# Patient Record
Sex: Male | Born: 1980
Health system: Southern US, Community
[De-identification: ages and names within clinical notes are randomized; demographics above are authoritative.]

## PROBLEM LIST (undated history)

## (undated) DIAGNOSIS — I10 Essential (primary) hypertension: Secondary | ICD-10-CM

## (undated) DIAGNOSIS — I5022 Chronic systolic (congestive) heart failure: Secondary | ICD-10-CM

## (undated) DIAGNOSIS — Z9889 Other specified postprocedural states: Secondary | ICD-10-CM

## (undated) DIAGNOSIS — Z91199 Patient's noncompliance with other medical treatment and regimen due to unspecified reason: Secondary | ICD-10-CM

## (undated) DIAGNOSIS — Z8701 Personal history of pneumonia (recurrent): Secondary | ICD-10-CM

## (undated) DIAGNOSIS — N182 Chronic kidney disease, stage 2 (mild): Secondary | ICD-10-CM

## (undated) DIAGNOSIS — I509 Heart failure, unspecified: Secondary | ICD-10-CM

## (undated) DIAGNOSIS — I428 Other cardiomyopathies: Secondary | ICD-10-CM

## (undated) DIAGNOSIS — R519 Headache, unspecified: Secondary | ICD-10-CM

## (undated) DIAGNOSIS — R51 Headache: Secondary | ICD-10-CM

## (undated) DIAGNOSIS — J189 Pneumonia, unspecified organism: Secondary | ICD-10-CM

## (undated) DIAGNOSIS — F419 Anxiety disorder, unspecified: Secondary | ICD-10-CM

## (undated) DIAGNOSIS — I340 Nonrheumatic mitral (valve) insufficiency: Secondary | ICD-10-CM

## (undated) DIAGNOSIS — Z9119 Patient's noncompliance with other medical treatment and regimen: Secondary | ICD-10-CM

## (undated) DIAGNOSIS — R0600 Dyspnea, unspecified: Secondary | ICD-10-CM

## (undated) HISTORY — PX: CARDIAC SURGERY: SHX584

## (undated) HISTORY — PX: SURGERY SCROTAL / TESTICULAR: SUR1316

---

## 2005-09-25 ENCOUNTER — Emergency Department (HOSPITAL_COMMUNITY): Admission: EM | Admit: 2005-09-25 | Discharge: 2005-09-26 | Payer: Self-pay | Admitting: Emergency Medicine

## 2009-07-13 DIAGNOSIS — J189 Pneumonia, unspecified organism: Secondary | ICD-10-CM

## 2009-07-13 HISTORY — DX: Pneumonia, unspecified organism: J18.9

## 2010-02-26 ENCOUNTER — Emergency Department (HOSPITAL_COMMUNITY): Admission: EM | Admit: 2010-02-26 | Discharge: 2010-02-27 | Payer: Self-pay | Admitting: Emergency Medicine

## 2010-10-21 ENCOUNTER — Emergency Department (HOSPITAL_COMMUNITY)
Admission: EM | Admit: 2010-10-21 | Discharge: 2010-10-21 | Disposition: A | Discharge status: Home / Self Care | Payer: Self-pay | Attending: Emergency Medicine | Admitting: Emergency Medicine

## 2010-10-21 ENCOUNTER — Emergency Department (HOSPITAL_COMMUNITY): Payer: Self-pay

## 2010-10-21 DIAGNOSIS — Z79899 Other long term (current) drug therapy: Secondary | ICD-10-CM | POA: Insufficient documentation

## 2010-10-21 DIAGNOSIS — R079 Chest pain, unspecified: Secondary | ICD-10-CM | POA: Insufficient documentation

## 2010-10-21 DIAGNOSIS — R0609 Other forms of dyspnea: Secondary | ICD-10-CM | POA: Insufficient documentation

## 2010-10-21 DIAGNOSIS — R059 Cough, unspecified: Secondary | ICD-10-CM | POA: Insufficient documentation

## 2010-10-21 DIAGNOSIS — R0989 Other specified symptoms and signs involving the circulatory and respiratory systems: Secondary | ICD-10-CM | POA: Insufficient documentation

## 2010-10-21 DIAGNOSIS — N289 Disorder of kidney and ureter, unspecified: Secondary | ICD-10-CM | POA: Insufficient documentation

## 2010-10-21 DIAGNOSIS — I10 Essential (primary) hypertension: Secondary | ICD-10-CM | POA: Insufficient documentation

## 2010-10-21 DIAGNOSIS — R05 Cough: Secondary | ICD-10-CM | POA: Insufficient documentation

## 2010-10-21 LAB — POCT I-STAT, CHEM 8
BUN: 22 mg/dL (ref 6–23)
Chloride: 105 mEq/L (ref 96–112)
Creatinine, Ser: 1.8 mg/dL — ABNORMAL HIGH (ref 0.4–1.5)
Glucose, Bld: 94 mg/dL (ref 70–99)
Potassium: 3.9 mEq/L (ref 3.5–5.1)
Sodium: 141 mEq/L (ref 135–145)

## 2010-11-22 ENCOUNTER — Emergency Department (HOSPITAL_COMMUNITY)
Admission: EM | Admit: 2010-11-22 | Discharge: 2010-11-23 | Disposition: A | Discharge status: Other Acute Inpatient Hospital | Payer: Self-pay | Source: Home / Self Care | Attending: Emergency Medicine | Admitting: Emergency Medicine

## 2010-11-22 ENCOUNTER — Emergency Department (HOSPITAL_COMMUNITY): Payer: Self-pay

## 2010-11-22 DIAGNOSIS — I428 Other cardiomyopathies: Secondary | ICD-10-CM | POA: Insufficient documentation

## 2010-11-22 DIAGNOSIS — Z91199 Patient's noncompliance with other medical treatment and regimen due to unspecified reason: Secondary | ICD-10-CM

## 2010-11-22 DIAGNOSIS — D509 Iron deficiency anemia, unspecified: Secondary | ICD-10-CM | POA: Diagnosis present

## 2010-11-22 DIAGNOSIS — I5021 Acute systolic (congestive) heart failure: Principal | ICD-10-CM | POA: Diagnosis present

## 2010-11-22 DIAGNOSIS — N289 Disorder of kidney and ureter, unspecified: Secondary | ICD-10-CM | POA: Diagnosis present

## 2010-11-22 DIAGNOSIS — E876 Hypokalemia: Secondary | ICD-10-CM | POA: Diagnosis present

## 2010-11-22 DIAGNOSIS — I10 Essential (primary) hypertension: Secondary | ICD-10-CM | POA: Diagnosis present

## 2010-11-22 DIAGNOSIS — I509 Heart failure, unspecified: Secondary | ICD-10-CM | POA: Diagnosis present

## 2010-11-22 DIAGNOSIS — R079 Chest pain, unspecified: Secondary | ICD-10-CM | POA: Insufficient documentation

## 2010-11-22 DIAGNOSIS — I498 Other specified cardiac arrhythmias: Secondary | ICD-10-CM | POA: Diagnosis present

## 2010-11-22 DIAGNOSIS — Z9119 Patient's noncompliance with other medical treatment and regimen: Secondary | ICD-10-CM

## 2010-11-22 DIAGNOSIS — R0602 Shortness of breath: Secondary | ICD-10-CM | POA: Insufficient documentation

## 2010-11-22 LAB — CBC
HCT: 32.9 % — ABNORMAL LOW (ref 39.0–52.0)
Hemoglobin: 11.1 g/dL — ABNORMAL LOW (ref 13.0–17.0)
MCH: 26.2 pg (ref 26.0–34.0)
MCHC: 33.7 g/dL (ref 30.0–36.0)
MCV: 77.6 fL — ABNORMAL LOW (ref 78.0–100.0)
RDW: 14.2 % (ref 11.5–15.5)

## 2010-11-22 LAB — DIFFERENTIAL
Basophils Absolute: 0.1 10*3/uL (ref 0.0–0.1)
Eosinophils Relative: 1 % (ref 0–5)
Lymphocytes Relative: 32 % (ref 12–46)
Lymphs Abs: 2.5 10*3/uL (ref 0.7–4.0)
Monocytes Absolute: 0.3 10*3/uL (ref 0.1–1.0)
Monocytes Relative: 4 % (ref 3–12)
Neutro Abs: 4.9 10*3/uL (ref 1.7–7.7)

## 2010-11-22 LAB — COMPREHENSIVE METABOLIC PANEL
ALT: 45 U/L (ref 0–53)
Albumin: 3.5 g/dL (ref 3.5–5.2)
Calcium: 9.9 mg/dL (ref 8.4–10.5)
Glucose, Bld: 116 mg/dL — ABNORMAL HIGH (ref 70–99)
Potassium: 3.5 mEq/L (ref 3.5–5.1)
Sodium: 134 mEq/L — ABNORMAL LOW (ref 135–145)
Total Protein: 7.1 g/dL (ref 6.0–8.3)

## 2010-11-22 LAB — CK TOTAL AND CKMB (NOT AT ARMC)
CK, MB: 2.7 ng/mL (ref 0.3–4.0)
Relative Index: 1.1 (ref 0.0–2.5)

## 2010-11-22 LAB — TROPONIN I: Troponin I: <0.3 ng/mL (ref <0.30)

## 2010-11-22 LAB — SAMPLE TO BLOOD BANK

## 2010-11-23 ENCOUNTER — Inpatient Hospital Stay (HOSPITAL_COMMUNITY)
Admission: EM | Admit: 2010-11-23 | Discharge: 2010-11-27 | DRG: 287 | Disposition: A | Discharge status: Home / Self Care | Payer: Self-pay | Source: Other Acute Inpatient Hospital | Attending: Internal Medicine | Admitting: Internal Medicine

## 2010-11-23 ENCOUNTER — Inpatient Hospital Stay (HOSPITAL_COMMUNITY): Payer: Self-pay

## 2010-11-23 LAB — COMPREHENSIVE METABOLIC PANEL
Albumin: 3.9 g/dL (ref 3.5–5.2)
BUN: 16 mg/dL (ref 6–23)
Calcium: 9.6 mg/dL (ref 8.4–10.5)
Chloride: 99 mEq/L (ref 96–112)
Creatinine, Ser: 1.48 mg/dL (ref 0.4–1.5)
Total Bilirubin: 0.9 mg/dL (ref 0.3–1.2)
Total Protein: 7.8 g/dL (ref 6.0–8.3)

## 2010-11-23 LAB — URINALYSIS, ROUTINE W REFLEX MICROSCOPIC
Glucose, UA: NEGATIVE mg/dL
Leukocytes, UA: NEGATIVE
pH: 6 (ref 5.0–8.0)

## 2010-11-23 LAB — CBC
Hemoglobin: 12.9 g/dL — ABNORMAL LOW (ref 13.0–17.0)
RBC: 4.85 MIL/uL (ref 4.22–5.81)
WBC: 7.9 10*3/uL (ref 4.0–10.5)

## 2010-11-23 LAB — CARDIAC PANEL(CRET KIN+CKTOT+MB+TROPI)
CK, MB: 2.7 ng/mL (ref 0.3–4.0)
CK, MB: 2.4 ng/mL (ref 0.3–4.0)
Relative Index: 1.1 (ref 0.0–2.5)
Relative Index: 1 (ref 0.0–2.5)
Total CK: 246 U/L — ABNORMAL HIGH (ref 7–232)
Total CK: 213 U/L (ref 7–232)
Total CK: 211 U/L (ref 7–232)
Troponin I: <0.3 ng/mL (ref <0.30)
Troponin I: <0.3 ng/mL (ref <0.30)

## 2010-11-23 LAB — PRO B NATRIURETIC PEPTIDE: Pro B Natriuretic peptide (BNP): 5156 pg/mL — ABNORMAL HIGH (ref 0–125)

## 2010-11-23 LAB — HEMOGLOBIN A1C
Hgb A1c MFr Bld: 5.9 % — ABNORMAL HIGH (ref <5.7)
Mean Plasma Glucose: 123 mg/dL — ABNORMAL HIGH (ref <117)

## 2010-11-23 LAB — URINE MICROSCOPIC-ADD ON

## 2010-11-23 LAB — RAPID URINE DRUG SCREEN, HOSP PERFORMED: Barbiturates: NOT DETECTED

## 2010-11-23 LAB — MRSA PCR SCREENING: MRSA by PCR: NEGATIVE

## 2010-11-23 LAB — GLUCOSE, CAPILLARY: Glucose-Capillary: 131 mg/dL — ABNORMAL HIGH (ref 70–99)

## 2010-11-23 MED ORDER — IOHEXOL 300 MG/ML  SOLN: 80.0000 mL | Freq: Once | INTRAMUSCULAR | Status: AC | PRN

## 2010-11-24 LAB — BASIC METABOLIC PANEL
CO2: 24 mEq/L (ref 19–32)
Calcium: 9.3 mg/dL (ref 8.4–10.5)
Creatinine, Ser: 1.71 mg/dL — ABNORMAL HIGH (ref 0.4–1.5)
GFR calc Af Amer: 58 mL/min — ABNORMAL LOW (ref >60)
GFR calc non Af Amer: 48 mL/min — ABNORMAL LOW (ref >60)
Sodium: 137 mEq/L (ref 135–145)

## 2010-11-25 LAB — BASIC METABOLIC PANEL
Calcium: 9.3 mg/dL (ref 8.4–10.5)
GFR calc Af Amer: >60 mL/min (ref >60)
GFR calc non Af Amer: 58 mL/min — ABNORMAL LOW (ref >60)
Potassium: 3 mEq/L — ABNORMAL LOW (ref 3.5–5.1)
Sodium: 136 mEq/L (ref 135–145)

## 2010-11-25 LAB — POTASSIUM: Potassium: 3.6 mEq/L (ref 3.5–5.1)

## 2010-11-25 LAB — POCT I-STAT 3, VENOUS BLOOD GAS (G3P V)
Bicarbonate: 24.2 mEq/L — ABNORMAL HIGH (ref 20.0–24.0)
pH, Ven: 7.407 — ABNORMAL HIGH (ref 7.250–7.300)

## 2010-11-25 LAB — CBC
HCT: 32.8 % — ABNORMAL LOW (ref 39.0–52.0)
Hemoglobin: 11.1 g/dL — ABNORMAL LOW (ref 13.0–17.0)
MCH: 26.2 pg (ref 26.0–34.0)
RBC: 4.24 MIL/uL (ref 4.22–5.81)

## 2010-11-25 LAB — POCT I-STAT 3, ART BLOOD GAS (G3+)
Acid-base deficit: 2 mmol/L (ref 0.0–2.0)
Bicarbonate: 22.3 mEq/L (ref 20.0–24.0)
pCO2 arterial: 34.8 mmHg — ABNORMAL LOW (ref 35.0–45.0)
pH, Arterial: 7.415 (ref 7.350–7.450)
pO2, Arterial: 66 mmHg — ABNORMAL LOW (ref 80.0–100.0)

## 2010-11-25 LAB — PROTIME-INR
INR: 1.16 (ref 0.00–1.49)
Prothrombin Time: 15 seconds (ref 11.6–15.2)

## 2010-11-26 LAB — CBC
HCT: 33.6 % — ABNORMAL LOW (ref 39.0–52.0)
MCH: 26.3 pg (ref 26.0–34.0)
MCV: 76.7 fL — ABNORMAL LOW (ref 78.0–100.0)
RDW: 13.8 % (ref 11.5–15.5)
WBC: 5.4 10*3/uL (ref 4.0–10.5)

## 2010-11-26 LAB — BASIC METABOLIC PANEL
BUN: 23 mg/dL (ref 6–23)
Chloride: 104 mEq/L (ref 96–112)
Creatinine, Ser: 1.62 mg/dL — ABNORMAL HIGH (ref 0.4–1.5)
GFR calc non Af Amer: 51 mL/min — ABNORMAL LOW (ref >60)
Glucose, Bld: 99 mg/dL (ref 70–99)
Potassium: 3.6 mEq/L (ref 3.5–5.1)

## 2010-11-26 NOTE — Cardiovascular Report (Signed)
  NAMEPAULINO, [name]             ACCOUNT NO.:  [id number]  MEDICAL RECORD NO.:  [id number]           PATIENT TYPE:  I  LOCATION:  [id number]                         FACILITY:  [facility]  PHYSICIAN:  [name] Son Barkan, MD         DATE OF BIRTH:  1980/12/16  DATE OF PROCEDURE:  11/25/2010 DATE OF DISCHARGE:                           CARDIAC CATHETERIZATION   PREPROCEDURE DIAGNOSIS:  Systolic heart failure.  POSTPROCEDURE DIAGNOSIS:  Dilated, non-ischemic cardiomyopathy with  normal coronary arteries.  HISTORY OF PRESENT ILLNESS:  [name] [name] is a 30 year old gentleman with 3 months of progressively increasing shortness of breath, was found to be in decompensated heart failure with a newly reduced ejection fraction of 15-20% and global hypokinesis.  He was referred for left and right heart catheterization to assess filling pressures and cardiac output, as well as rule out any underlying ischemic etiology.  PROCEDURE:  After the patient was brought into cardiac catheterization lab, sterilely prepped and draped in usual fashion.  After procedural time-out, the area around the right femoral artery and femoral vein was identified and 5 mL of 1% lidocaine was used for local anesthesia.  The patient was given 1 mg of Versed and 25 mcg of fentanyl for moderate sedation.  After this, a 5-French right femoral arterial access was obtained and a 8-French right femoral vein access catheter was placed next to it.  Through these catheters, a right and left heart catheterization was performed using a 7-French VIP Swan, a 5-French pigtail catheter, 5-French JR-4 catheter, and 5-French JL-4 catheters. Estimated blood loss was less than 10 mL, and there were no acute complications.  FINDINGS: 1. Left main - no disease. 2. LAD - no disease. 3. Left circumflex - large dominant vessel.  No disease. 4. RCA - small vessel with no disease. 5. LVEDP = 18 mmHg.  Right heart: 1. RA - 8. 2. RV - 44/10. 3. PA -  43/26 (34). 4. PCWP - 29. 5. PA sat % - 62%. 6. AO sat% - 93%. 7. Fick cardiac output/cardiac index - 5.09/2.45. 8. Thermodilution cardiac output/cardiac index - 5.2/2.5.  IMPRESSION: 1. No evidence of coronary artery disease. 2. LVEDP = 18 mmHg. 3. Mildly reduced cardiac output and index with good pulmonary artery     saturation and elevated PCWP.  PLAN: 1. Continue diuresis. 2. Optimize heart failure medications.     [name] Zarius Furr, MD     CH/MEDQ  D:  11/25/2010  T:  11/26/2010  Job:  [id number]  Electronically Signed by K. Aurora Rody M.D. on 11/26/2010 09:42:16 AM

## 2010-11-27 LAB — BASIC METABOLIC PANEL
BUN: 21 mg/dL (ref 6–23)
Calcium: 9 mg/dL (ref 8.4–10.5)
GFR calc non Af Amer: 46 mL/min — ABNORMAL LOW (ref >60)
Glucose, Bld: 100 mg/dL — ABNORMAL HIGH (ref 70–99)

## 2010-11-27 LAB — FOLATE: Folate: 8.6 ng/mL

## 2010-11-27 LAB — CBC
HCT: 33.7 % — ABNORMAL LOW (ref 39.0–52.0)
MCHC: 33.2 g/dL (ref 30.0–36.0)
MCV: 77.6 fL — ABNORMAL LOW (ref 78.0–100.0)
RDW: 13.7 % (ref 11.5–15.5)

## 2010-11-27 LAB — FERRITIN: Ferritin: 156 ng/mL (ref 22–322)

## 2010-12-03 NOTE — Discharge Summary (Signed)
[name], [name]             ACCOUNT NO.:  [id number]  MEDICAL RECORD NO.:  [id number]           PATIENT TYPE:  I  LOCATION:  2005                         FACILITY:  [facility]  PHYSICIAN:  Mali Hilty, MD         DATE OF BIRTH:  [birth year]  DATE OF ADMISSION:  11/23/2010 DATE OF DISCHARGE:  11/27/2010                              DISCHARGE SUMMARY   DISCHARGE DIAGNOSES: 1. Acute systolic heart failure. 2. Nonischemic cardiomyopathy with ejection fraction 15-20%. 3. History of hypertension. 4. Hypokalemia, repleted. 5. Acute renal insufficiency. 6. Anemia, microcytic.  HOSPITAL COURSE:  [name] [name] is a 30 year old African American male who was transferred from [facility].  He presented with complaints of shortness of breath that he had reported for the last 2-3 months.  He also reported that he had a significant illness in December with extremely high fever, nausea, and vomiting.  The patient describes a significant decrease in activity, tolerance over the last 2-3 days prior to admission.  He also started experiencing orthopnea and paroxysmal internal dyspnea with significant dyspnea on exertion.  He also stated he noted some wheezing and using a friend's nebulizer machine with little improvement.  He also reported chest pressure, soreness in his redness, and his entire chest intermittently.  He had been seen in April in the emergency department for chest pressure, was found to be hypertensive at that time and chest x-ray revealed cardiomegaly.  Labs were within normal limits at that time.  On arrival here at [facility] this admission, he had a normal CK-MB and troponin. His BNP was elevated to 7084, and again his chest x-ray revealed cardiomegaly with interstitial edema and bilateral lower lobe opacities. The patient was started on 40 mg of IV Lasix, had been examined and transfer here to [facility].  He remained tachypneic upon arrival, respiratory rate in 30s to  40s, hypertensive at 151/110, was admitted, continued on IV diuretics.  A 2D echocardiogram was ordered.  Cardiac enzymes were cycled.  IV diuretics was at 40 mg q.6 h., was also started on IV nitroglycerin at 10 mcg per minute.  He was also started on ACE inhibitor.  2D echocardiogram showed ejection fraction less than 20% and Doppler parameters were consistent with a restrictive stage III diastolic physiology.  Left atrium was severely dilated.  Left ventricle was moderately dilated with eccentric hypertrophy.  Peak PA pressure 34 mmHg.  [name] [name] first day of diuresis was over 5 liters. Subsequent labs revealed hypokalemia, which was repleted and is probably secondary to IV diuretics.  Zoll LifeVest was ordered.  [name] [name] was scheduled for right and left heart catheterization.  This revealed no evidence of coronary artery disease.  Left ventricular end-diastolic pressure was 18 mmHg.  There was mildly reduced cardiac output and index with good pulmonary artery saturation and elevated PCWP.  The patient continued without complaints.  He continued to diurese.  He was switched from IV diuretics to p.o. as well as substernal carvedilol 6.25 mg b.i.d. on spirolactone 12.5 mg daily.  His hemoglobin was 11.2 with MCV of 77.6.  Anemia panel was ordered.  Results pending.  [name] [name] has been seen by Dr. Debara Pickett, feels he is stable for discharge possibly today, Nov 27, 2010 pending fit of his LifeVest.  Once that is completed, he will be discharged home, and currently has no complaints.  Blood pressure is stable at 115/74.  He has mildly increased heart rate at 96 beats per minute.  DISCHARGE LABORATORY DATA:  WBC 7.5, hemoglobin 11.2, hematocrit 33.7, platelets 289.  Sodium 140, potassium 3.6, chloride 106, carbon dioxide 25, glucose 100, BUN 21, creatinine 1.77, calcium 9.0, magnesium 2.2. Thyroid stimulating hormone 2.473.  Cardiac enzymes were negative x4. Urine drug screen was  negative.  Initial urinalysis was negative for infection.  STUDIES/PROCEDURES: 1. Chest x-ray Nov 23, 2010, progression of bibasilar airspace     disease.  Stable cardiomegaly.  CT angiogram of the chest was     negative for acute PE.  Bibasilar airspace disease, right worse     than left with small right effusion suggesting pneumonia less     likely atypical edema, autoimmune or allergies reaction.  Small     pericardial effusion. 2. Echocardiogram study conclusions, left ventricle cavity size is     mildly dilated and wall thickness was increased with probable     eccentric hypertrophy.  Systolic function was severely reduced.     There is global hypokinesis with estimated ejection fraction less     than 20%.  Doppler parameters consistent with restrictive diastolic     physiology stage III.  The E/E prime was greater than 20 suggesting     markedly increased LV filling pressure.  The EA ratio was greater     than 3.  Aortic valve with mild central regurgitation.  The mitral     valve showed mitral annulus was dilated.  There was moderate mitral     regurgitation.  Left atrium severely dilated.  The right ventricle     cavity size was mildly dilated.  Systolic pressures was increased.     Right atrium was mildly dilated.  Pulmonary arteries PA pressure     was 34 mmHg.  The inferior vena cava vessel was in normal size.     There is trace to mild mostly posterior pericardial effusion.  No     echocardiographic signs of tamponade 3. Cardiac catheterization on Nov 26, 2010:  Findings, left main     disease.  LAD no disease.  Left circumflex, large dominant vessel,     no  disease.  RCA small vessel with no disease.  Left ventricular     diastolic pressure was 81 mmHg.  The right heart RA of 8, RV 44/10,     PA 43/26 (34), PCWP is 29, PA sat percent 62%, AO sat percent 93%,     thick cardiac output/cardiac index was 5.09/2.45.  Thermodilution     cardiac output/cardiac index was 5.2/2.5.   Impression:  No evidence     of coronary disease.  LVEDP of 18 mmHg and mildly reduced cardiac     output and index with a pulmonary artery saturation elevated PCWP.  DISCHARGE MEDICATIONS: 1. Aspirin 325 mg 1 tablet by mouth daily. 2. Carvedilol 6.25 mg 1 tablet by mouth twice daily with meals. 3. Digoxin 0.125 mg 1 tablet by mouth daily. 4. Furosemide 40 mg 1 tablet by mouth daily. 5. Isosorbide mononitrate XR 30 mg tablet 1/2 tablet by mouth daily. 6. Lisinopril 5 mg 1 tablet by mouth daily. 7. Spironolactone 25 mg 1/2 tablet by  mouth daily. 8. Ibuprofen 200 mg over-the-counter 1-2 tablets by mouth every 8     hours as needed for pain.  DISPOSITION:  [name] [name] will be discharged home in stable condition. He is recommended to increase his activity slowly.  May shower and bathe.  No lifting for 2 days.  No driving for 2 days.  He is recommended to eat a low-sodium heart-healthy diet.  If catheter site becomes red, painful, swollen, or discharges fluid or pus, he is to call our office immediately.  He will follow with Dr. Debara Pickett in the [facility] office on Dec 10, 2010, at 9:45 a.m.  He will be discharged once his Zoll LifeVest has been fitted.    ______________________________ Tarri Fuller, PA ______________________________ Mali Hilty, MD    BH/MEDQ  D:  11/27/2010  T:  11/28/2010  Job:  [id number]  Electronically Signed by Tarri Fuller PA on 12/01/2010 01:50:23 PM Electronically Signed by Raliegh Ip. HILTY M.D. on 12/03/2010 08:12:34 AM

## 2010-12-09 NOTE — H&P (Signed)
NAMEWITOLD, [name] [name]             ACCOUNT NO.:  [id number]  MEDICAL RECORD NO.:  [id number]           PATIENT TYPE:  I  LOCATION:  [id number]                         FACILITY:  [facility]  PHYSICIAN:  Mali Charles Andringa, MD         DATE OF BIRTH:  [birth year]  DATE OF ADMISSION:  11/23/2010 DATE OF DISCHARGE:                             HISTORY & PHYSICAL   CHIEF COMPLAINT:  Shortness of breath.  HISTORY OF PRESENT ILLNESS:  [name] [name] is a very pleasant 30 year old African American male who is received and transferred from [facility].  He presented last evening with complaints of shortness of breath.  He reports that for the last to 2-3 months, he has felt poorly with noticeable shortness of breath.  At one point, he has had cough, which was productive at times with clear sputum but no fevers.  He does report a significant illness in December with extremely high fever, nausea, and vomiting, who presented to the emergency department last evening because his symptoms were getting worse.  He had noticed a significant decrease in his activity tolerance over the previous 2-3 days.  He has begun to experience orthopnea and PND as well as significant dyspnea on exertion.  He has noted some wheezing and has been using a friend's neb machine with very little improvement in his symptoms.  He has had some chest pressure occasionally and reports some soreness in his ribs and in his chest intermittently, however, at this time this has improved.  He did present in April to the emergency department for evaluation of chest pressure.  He was found to be hypertensive at that time.  Chest x-ray revealed cardiac cardiomegaly. His labs at that time were within normal limits with the exception of creatinine of 1.8.  On arrival to the emergency department last evening, his CK was 256 with a normal MB and troponin.  His BNP was 7084 and his chest x-ray revealed cardiomegaly with interstitial edema and  bilateral lower lobe opacities.  He was treated with 40 mg of Lasix IV at [facility] and transferred to [facility].  He has been diuresing nicely, however, remains tachypneic with respiratory rates between 30 and 40 per minute.  He also remains hypertensive with blood pressures 151/110.  He does report some improvement in his breathing and has not experienced any further chest pressure.  PAST MEDICAL HISTORY:  Hypertension, when he was seen in April, he was given a prescription for hydrochlorothiazide, however, due to financial reasons he was unable to get that filled, therefore he has not been treating his hypertension.  FAMILY HISTORY:  Mother and father healthy.  There is no family history of coronary artery disease, congestive heart failure or sudden cardiac death that the patient is aware of.  SOCIAL HISTORY:  He is single.  He is currently unemployed.  He lives with his mother.  He denies any tobacco, alcohol or illicit drug use and his urine drug screen is negative.  ALLERGIES:  None known.  CURRENT MEDICATIONS:  None.  REVIEW OF SYSTEMS:  GENERAL:  Positive for fatigue, decreased appetite. No fever or  chills.  HEENT:  No upper respiratory infection, no sore throat or nasal congestion.  CARDIOVASCULAR:  As per HPI.  He denies any lightheadedness, dizziness, no syncope or presyncope.  Noted to palpitations or tachycardia.  RESPIRATORY:  Shortness of breath, dyspnea on exertion, wheezing and cough, which is productive of clear sputum. GI: No heartburn, indigestion.  No nausea or vomiting.  No melena or hematochezia.  GU: No urgency, frequency or hematuria.  ENDOCRINE:  No diabetes or thyroid disease.  SKIN:  No lesions, rashes.  NEUROLOGIC: No numbness or weakness in any of his extremities.  No headache.  PHYSICAL EXAMINATION:  VITAL SIGNS:  Blood pressure is 151110, pulse is 103 and regular, respirations 34, pulse ox is 99%. GENERAL:  This is a pleasant 30 year old African  American male with mild respiratory distress. HEENT:  Pupils are equal and reactive to light and accommodation. Extraocular movements intact.  Sclerae are nonicteric.  Conjunctivae are pink. NECK:  Supple.  No carotid bruits or thyromegaly.  There is JVD at 6 cm. CARDIOVASCULAR:  Regular rate and rhythm, tachycardic, S1, S2.  There is a 1-2/6 murmur heard best in the apex. LUNGS:  Diminished throughout with bibasilar rales. ABDOMEN:  Soft, nontender without hepatosplenomegaly or masses.  Bowel sounds are present x4. EXTREMITIES:  Radial, femoral, dorsal, pedal arteries are present. There is no lower extremity edema.  No clubbing, cyanosis or ulcers. NEUROLOGIC:  Oriented to person, place, and time.  Normal mood and affect.  Cranial nerves II-XII grossly intact. SKIN:  Warm and dry.  LABORATORY DATA:  EKG reveals sinus tachycardia with left ventricular hypertrophy.  Urine drug screen is negative.  CK is 256, MB 2.7 and troponin is less than 0.30.  Sodium is 134, potassium is 3.5, glucose is 116, BUN is 20, creatinine is 1.7.  Liver function studies are normal. BNP is 7084.  Hemoglobin is 11.1, hematocrit 32.9.  Chest x-ray reveals cardiomegaly with interstitial edema and predominantly bilateral lower lobe alveolar opacities likely early edema.  IMPRESSION: 1. Shortness of breath. 2. Pulmonary edema. 3. Cardiomegaly. 4. Hypertension. 5. Noncompliance secondary to financial issues. 6. Murmur.  PLAN:  We will admit to step-down unit.  We will diurese with Lasix 40 mg q.6 h.  We will continue IV nitroglycerin at 10 mcg.  We will get him beta blocker as well as ACE inhibitor.  We will obtain an echo first thing this morning to evaluate LV function as well as his mitral valve. We will titrate his medications as he tolerates.  We will repeat a chest x-ray, BNP and a BMET this morning.    ______________________________ Blair Dolphin, NP   ______________________________ Mali Endia Moncur,  MD    LS/MEDQ  D:  11/23/2010  T:  11/23/2010  Job:  [id number]  cc:   [facility]  Electronically Signed by Bari Mantis NP on 12/08/2010 10:06:53 PM Electronically Signed by Raliegh Ip. Mikiyah Glasner M.D. on 12/09/2010 11:50:46 AM

## 2011-01-08 ENCOUNTER — Encounter (HOSPITAL_COMMUNITY): Payer: Self-pay | Attending: Internal Medicine

## 2011-01-12 ENCOUNTER — Encounter (HOSPITAL_COMMUNITY): Payer: Self-pay

## 2011-01-12 DIAGNOSIS — Z5189 Encounter for other specified aftercare: Secondary | ICD-10-CM | POA: Insufficient documentation

## 2011-01-12 DIAGNOSIS — I509 Heart failure, unspecified: Secondary | ICD-10-CM | POA: Insufficient documentation

## 2011-01-12 DIAGNOSIS — I2589 Other forms of chronic ischemic heart disease: Secondary | ICD-10-CM | POA: Insufficient documentation

## 2011-01-12 DIAGNOSIS — I10 Essential (primary) hypertension: Secondary | ICD-10-CM | POA: Insufficient documentation

## 2011-01-14 ENCOUNTER — Encounter (HOSPITAL_COMMUNITY): Payer: Self-pay

## 2011-01-16 ENCOUNTER — Encounter (HOSPITAL_COMMUNITY)
Admission: RE | Admit: 2011-01-16 | Discharge: 2011-01-16 | Disposition: A | Discharge status: Home / Self Care | Payer: Self-pay | Source: Ambulatory Visit | Attending: Internal Medicine | Admitting: Internal Medicine

## 2011-01-19 ENCOUNTER — Encounter (HOSPITAL_COMMUNITY)
Admission: RE | Admit: 2011-01-19 | Discharge: 2011-01-19 | Disposition: A | Discharge status: Home / Self Care | Payer: Self-pay | Source: Ambulatory Visit | Attending: Internal Medicine | Admitting: Internal Medicine

## 2011-01-21 ENCOUNTER — Encounter (HOSPITAL_COMMUNITY)
Admission: RE | Admit: 2011-01-21 | Discharge: 2011-01-21 | Disposition: A | Discharge status: Home / Self Care | Payer: Self-pay | Source: Ambulatory Visit | Attending: Internal Medicine | Admitting: Internal Medicine

## 2011-01-23 ENCOUNTER — Encounter (HOSPITAL_COMMUNITY)
Admission: RE | Admit: 2011-01-23 | Discharge: 2011-01-23 | Disposition: A | Discharge status: Home / Self Care | Payer: Self-pay | Source: Ambulatory Visit | Attending: Internal Medicine | Admitting: Internal Medicine

## 2011-01-26 ENCOUNTER — Encounter (HOSPITAL_COMMUNITY)
Admission: RE | Admit: 2011-01-26 | Discharge: 2011-01-26 | Disposition: A | Discharge status: Home / Self Care | Payer: Self-pay | Source: Ambulatory Visit | Attending: Internal Medicine | Admitting: Internal Medicine

## 2011-01-28 ENCOUNTER — Encounter (HOSPITAL_COMMUNITY)
Admission: RE | Admit: 2011-01-28 | Discharge: 2011-01-28 | Disposition: A | Discharge status: Home / Self Care | Payer: Self-pay | Source: Ambulatory Visit | Attending: Internal Medicine | Admitting: Internal Medicine

## 2011-01-30 ENCOUNTER — Encounter (HOSPITAL_COMMUNITY)
Admission: RE | Admit: 2011-01-30 | Discharge: 2011-01-30 | Disposition: A | Discharge status: Home / Self Care | Payer: Self-pay | Source: Ambulatory Visit | Attending: Internal Medicine | Admitting: Internal Medicine

## 2011-02-02 ENCOUNTER — Encounter (HOSPITAL_COMMUNITY): Payer: Self-pay

## 2011-02-04 ENCOUNTER — Encounter (HOSPITAL_COMMUNITY)
Admission: RE | Admit: 2011-02-04 | Discharge: 2011-02-04 | Disposition: A | Discharge status: Home / Self Care | Payer: Self-pay | Source: Ambulatory Visit | Attending: Internal Medicine | Admitting: Internal Medicine

## 2011-02-06 ENCOUNTER — Encounter (HOSPITAL_COMMUNITY)
Admission: RE | Admit: 2011-02-06 | Discharge: 2011-02-06 | Disposition: A | Discharge status: Home / Self Care | Payer: Self-pay | Source: Ambulatory Visit | Attending: Internal Medicine | Admitting: Internal Medicine

## 2011-02-09 ENCOUNTER — Encounter (HOSPITAL_COMMUNITY): Payer: Self-pay

## 2011-02-11 ENCOUNTER — Encounter (HOSPITAL_COMMUNITY): Payer: Self-pay

## 2011-02-13 ENCOUNTER — Encounter (HOSPITAL_COMMUNITY): Payer: Self-pay

## 2011-02-16 ENCOUNTER — Encounter (HOSPITAL_COMMUNITY)
Admission: RE | Admit: 2011-02-16 | Discharge: 2011-02-16 | Disposition: A | Discharge status: Home / Self Care | Payer: Self-pay | Source: Ambulatory Visit | Attending: Internal Medicine | Admitting: Internal Medicine

## 2011-02-16 DIAGNOSIS — I509 Heart failure, unspecified: Secondary | ICD-10-CM | POA: Insufficient documentation

## 2011-02-16 DIAGNOSIS — I10 Essential (primary) hypertension: Secondary | ICD-10-CM | POA: Insufficient documentation

## 2011-02-16 DIAGNOSIS — I2589 Other forms of chronic ischemic heart disease: Secondary | ICD-10-CM | POA: Insufficient documentation

## 2011-02-16 DIAGNOSIS — Z5189 Encounter for other specified aftercare: Secondary | ICD-10-CM | POA: Insufficient documentation

## 2011-02-18 ENCOUNTER — Encounter (HOSPITAL_COMMUNITY): Payer: Self-pay

## 2011-02-20 ENCOUNTER — Encounter (HOSPITAL_COMMUNITY): Payer: Self-pay

## 2011-02-23 ENCOUNTER — Encounter (HOSPITAL_COMMUNITY): Payer: Self-pay

## 2011-02-25 ENCOUNTER — Encounter (HOSPITAL_COMMUNITY)
Admission: RE | Admit: 2011-02-25 | Discharge: 2011-02-25 | Disposition: A | Discharge status: Home / Self Care | Payer: Self-pay | Source: Ambulatory Visit | Attending: Internal Medicine | Admitting: Internal Medicine

## 2011-02-27 ENCOUNTER — Encounter (HOSPITAL_COMMUNITY)
Admission: RE | Admit: 2011-02-27 | Discharge: 2011-02-27 | Disposition: A | Discharge status: Home / Self Care | Payer: Self-pay | Source: Ambulatory Visit | Attending: Internal Medicine | Admitting: Internal Medicine

## 2011-03-02 ENCOUNTER — Encounter (HOSPITAL_COMMUNITY): Payer: Self-pay

## 2011-03-04 ENCOUNTER — Encounter (HOSPITAL_COMMUNITY): Payer: Self-pay

## 2011-03-06 ENCOUNTER — Encounter (HOSPITAL_COMMUNITY): Payer: Self-pay

## 2011-03-09 ENCOUNTER — Encounter (HOSPITAL_COMMUNITY): Payer: Self-pay

## 2011-03-11 ENCOUNTER — Encounter (HOSPITAL_COMMUNITY): Payer: Self-pay

## 2011-03-13 ENCOUNTER — Encounter (HOSPITAL_COMMUNITY): Payer: Self-pay

## 2011-03-16 ENCOUNTER — Encounter (HOSPITAL_COMMUNITY): Payer: Self-pay

## 2011-03-18 ENCOUNTER — Encounter (HOSPITAL_COMMUNITY): Payer: Self-pay

## 2011-03-20 ENCOUNTER — Encounter (HOSPITAL_COMMUNITY): Payer: Self-pay

## 2011-03-20 NOTE — Progress Notes (Signed)
Cardiac Rehab Progress Report  Orientation:  01/08/2011  Graduate Date:  tbd Discharge Date: 02/27/2011  Cardiologist: Dr. Debara Pickett Family MD:  Dr. Odis Luster Time:  11:00  A.  Exercise Program:  Tolerates exercise @ 3.5 METS for 15 minutes and Discharged  B.  Mental Health:  Good mental attitude  C.  Education/Instruction/Skills  Knows THR for exercise and Uses Perceived Exertion Scale and/or Dyspnea Scale  D.  Nutrition/Weight Control/Body Composition:  Adherence to prescribed nutrition program: good   E.  Blood Lipids    No results found for this basename: CHOL     No results found for this basename: TRIG     No results found for this basename: HDL     No results found for this basename: CHOLHDL     No results found for this basename: LDLDIRECT      F.  Lifestyle Changes:  Making positive lifestyle changes  G.  Symptoms noted with exercise:  Asymptomatic  Report Completed By:  Norlene Duel  Comments:  Pt came thru 02/27/2011 and did not return. He achieved mets of 3.5. He did very well while in class. He did not attend consistently.

## 2011-03-20 NOTE — Progress Notes (Signed)
Cardiac Rehab Progress Report  Orientation:  01/08/2011 Graduate Date: tbd Discharge Date:  tbd  Cardiologist:  Dr. Debara Pickett Family MD:  Dr. Odis Luster Time:  11:00  A.  Exercise Program:  Tolerates exercise @ 2.9 METS for 15 minutes  B.  Mental Health:  Good mental attitude  C.  Education/Instruction/Skills  Knows THR for exercise and Uses Perceived Exertion Scale and/or Dyspnea Scale  D.  Nutrition/Weight Control/Body Composition:  Adherence to prescribed nutrition program: good   E.  Blood Lipids    No results found for this basename: CHOL     No results found for this basename: TRIG     No results found for this basename: HDL     No results found for this basename: CHOLHDL     No results found for this basename: LDLDIRECT      F.  Lifestyle Changes:  Making positive lifestyle changes  G.  Symptoms noted with exercise:  Asymptomatic  Report Completed By:  Norlene Duel  Comments:  Patient has done very well for his 1st 3 sessions. He achieved a peak mets of 2.9

## 2011-03-23 ENCOUNTER — Encounter (HOSPITAL_COMMUNITY): Payer: Self-pay

## 2011-03-25 ENCOUNTER — Encounter (HOSPITAL_COMMUNITY): Payer: Self-pay

## 2011-03-27 ENCOUNTER — Encounter (HOSPITAL_COMMUNITY): Payer: Self-pay

## 2011-03-30 ENCOUNTER — Encounter (HOSPITAL_COMMUNITY): Payer: Self-pay

## 2011-04-01 ENCOUNTER — Encounter (HOSPITAL_COMMUNITY): Payer: Self-pay

## 2011-04-03 ENCOUNTER — Encounter (HOSPITAL_COMMUNITY): Payer: Self-pay

## 2011-04-06 ENCOUNTER — Encounter (HOSPITAL_COMMUNITY): Payer: Self-pay

## 2011-04-08 ENCOUNTER — Encounter (HOSPITAL_COMMUNITY): Payer: Self-pay

## 2011-04-10 ENCOUNTER — Encounter (HOSPITAL_COMMUNITY): Payer: Self-pay

## 2011-04-13 ENCOUNTER — Encounter (HOSPITAL_COMMUNITY): Payer: Self-pay

## 2011-04-15 ENCOUNTER — Encounter (HOSPITAL_COMMUNITY): Payer: Self-pay

## 2011-04-17 ENCOUNTER — Encounter (HOSPITAL_COMMUNITY): Payer: Self-pay

## 2011-04-20 ENCOUNTER — Encounter (HOSPITAL_COMMUNITY): Payer: Self-pay

## 2011-04-22 ENCOUNTER — Encounter (HOSPITAL_COMMUNITY): Payer: Self-pay

## 2011-04-24 ENCOUNTER — Encounter (HOSPITAL_COMMUNITY): Payer: Self-pay

## 2011-04-27 ENCOUNTER — Encounter (HOSPITAL_COMMUNITY): Payer: Self-pay

## 2011-04-29 ENCOUNTER — Encounter (HOSPITAL_COMMUNITY): Payer: Self-pay

## 2011-05-01 ENCOUNTER — Encounter (HOSPITAL_COMMUNITY): Payer: Self-pay

## 2011-05-04 ENCOUNTER — Encounter (HOSPITAL_COMMUNITY): Payer: Self-pay

## 2011-05-06 ENCOUNTER — Encounter (HOSPITAL_COMMUNITY): Payer: Self-pay

## 2011-05-08 ENCOUNTER — Encounter (HOSPITAL_COMMUNITY): Payer: Self-pay

## 2011-11-30 ENCOUNTER — Emergency Department (HOSPITAL_COMMUNITY): Payer: Self-pay

## 2011-11-30 ENCOUNTER — Emergency Department (HOSPITAL_COMMUNITY)
Admission: EM | Admit: 2011-11-30 | Discharge: 2011-11-30 | Disposition: A | Discharge status: Home / Self Care | Payer: Self-pay | Attending: Emergency Medicine | Admitting: Emergency Medicine

## 2011-11-30 ENCOUNTER — Encounter (HOSPITAL_COMMUNITY): Payer: Self-pay

## 2011-11-30 DIAGNOSIS — J40 Bronchitis, not specified as acute or chronic: Secondary | ICD-10-CM

## 2011-11-30 DIAGNOSIS — R0602 Shortness of breath: Secondary | ICD-10-CM | POA: Insufficient documentation

## 2011-11-30 DIAGNOSIS — R05 Cough: Secondary | ICD-10-CM

## 2011-11-30 DIAGNOSIS — R059 Cough, unspecified: Secondary | ICD-10-CM | POA: Insufficient documentation

## 2011-11-30 HISTORY — DX: Pneumonia, unspecified organism: J18.9

## 2011-11-30 MED ORDER — LEVOFLOXACIN 500 MG PO TABS: 500.0000 mg | ORAL_TABLET | Freq: Every day | ORAL | Status: AC

## 2011-11-30 MED ORDER — PREDNISONE 20 MG PO TABS
60.0000 mg | ORAL_TABLET | Freq: Once | ORAL | Status: AC
  Administered 2011-11-30: 60 mg via ORAL
  Filled 2011-11-30: qty 3

## 2011-11-30 MED ORDER — ALBUTEROL SULFATE HFA 108 (90 BASE) MCG/ACT IN AERS: 1.0000 | INHALATION_SPRAY | Freq: Four times a day (QID) | RESPIRATORY_TRACT | Status: DC | PRN

## 2011-11-30 MED ORDER — ALBUTEROL SULFATE (5 MG/ML) 0.5% IN NEBU
2.5000 mg | INHALATION_SOLUTION | Freq: Once | RESPIRATORY_TRACT | Status: AC
  Administered 2011-11-30: 2.5 mg via RESPIRATORY_TRACT
  Filled 2011-11-30: qty 0.5

## 2011-11-30 MED ORDER — LEVOFLOXACIN 500 MG PO TABS
500.0000 mg | ORAL_TABLET | Freq: Once | ORAL | Status: AC
  Administered 2011-11-30: 500 mg via ORAL
  Filled 2011-11-30: qty 1

## 2011-11-30 MED ORDER — PREDNISONE 10 MG PO TABS: 20.0000 mg | ORAL_TABLET | Freq: Every day | ORAL | Status: AC

## 2011-11-30 NOTE — ED Notes (Signed)
Productive cough, congestion, concerned that he had pneumonia last year when he had these symptoms, denies pain

## 2011-11-30 NOTE — Discharge Instructions (Signed)
You have an early bronchitis on your chest xray. Take all of the prednisone and antibiotic. Use the inhaler if you have wheezing.    Bronchitis Bronchitis is a problem of the air tubes leading to your lungs. This problem makes it hard for air to get in and out of the lungs. You may cough a lot because your air tubes are narrow. Going without care can cause lasting (chronic) bronchitis. HOME CARE   Drink enough fluids to keep your pee (urine) clear or pale yellow.   Use a cool mist humidifier.   Quit smoking if you smoke. If you keep smoking, the bronchitis might not get better.   Only take medicine as told by your doctor.  GET HELP RIGHT AWAY IF:   Coughing keeps you awake.   You start to wheeze.   You become more sick or weak.   You have a hard time breathing or get short of breath.   You cough up blood.   Coughing lasts more than 2 weeks.   You have a fever.   Your baby is older than 3 months with a rectal temperature of 102 F (38.9 C) or higher.   Your baby is 5 months old or younger with a rectal temperature of 100.4 F (38 C) or higher.  MAKE SURE YOU:  Understand these instructions.   Will watch your condition.   Will get help right away if you are not doing well or get worse.  Document Released: 12/16/2007 Document Revised: 06/18/2011 Document Reviewed: 05/31/2009 [facility] Patient Information 2012 [place].

## 2011-11-30 NOTE — ED Provider Notes (Signed)
History     CSN: [id number]  Arrival date & time 11/30/11  0214   First MD Initiated Contact with Patient 11/30/11 0225      Chief Complaint  Patient presents with  . Cough    (Consider location/radiation/quality/duration/timing/severity/associated sxs/prior treatment) HPI [name] [name] is a 31 y.o. male who presents to the Emergency Department complaining of cough and shortness of breath present for several days. Cough is worse at night when he lies down. It has been associated with some wheezing. Denies fever, chills, nausea, vomiting.   Past Medical History  Diagnosis Date  . Pneumonia     Past Surgical History  Procedure Date  . Surgery scrotal / testicular     testicular torsion    No family history on file.  History  Substance Use Topics  . Smoking status: Never Smoker   . Smokeless tobacco: Not on file  . Alcohol Use: No      Review of Systems  Constitutional: Negative for fever.       10 Systems reviewed and are negative for acute change except as noted in the HPI.  HENT: Negative for congestion.   Eyes: Negative for discharge and redness.  Respiratory: Positive for cough, shortness of breath and wheezing.   Cardiovascular: Negative for chest pain.  Gastrointestinal: Negative for vomiting and abdominal pain.  Musculoskeletal: Negative for back pain.  Skin: Negative for rash.  Neurological: Negative for syncope, numbness and headaches.  Psychiatric/Behavioral:       No behavior change.    Allergies  Review of patient's allergies indicates no known allergies.  Home Medications  No current outpatient prescriptions on file.  BP 168/120  Pulse 110  Temp(Src) 98.4 F (36.9 C) (Oral)  Resp 22  Ht 5\' 10"  (1.778 m)  Wt 207 lb (93.895 kg)  BMI 29.70 kg/m2  SpO2 94%  Physical Exam  Nursing note and vitals reviewed. Constitutional:       Awake, alert, nontoxic appearance.  HENT:  Head: Atraumatic.  Eyes: Right eye exhibits no discharge.  Left eye exhibits no discharge.  Neck: Neck supple.  Cardiovascular: Normal rate, normal heart sounds and intact distal pulses.   Pulmonary/Chest: Effort normal. He exhibits no tenderness.       occasional end expiratory wheeze  Abdominal: Soft. There is no tenderness. There is no rebound.  Musculoskeletal: He exhibits no tenderness.       Baseline ROM, no obvious new focal weakness.  Neurological:       Mental status and motor strength appears baseline for patient and situation.  Skin: No rash noted.  Psychiatric: He has a normal mood and affect.    ED Course  Procedures (including critical care time)  Labs Reviewed - No data to display Dg Chest 2 View  11/30/2011  *RADIOLOGY REPORT*  Clinical Data: Wheezing, difficulty breathing, asthma history.  CHEST - 2 VIEW  Comparison: 11/23/2010 CT  Findings: Mild right lung base hazy opacity.  Mild central peribronchial cuffing.  Cardiomegaly.  No pleural effusion.  No pneumothorax.  No acute osseous finding.  IMPRESSION: Mild right lower lobe opacity; atelectasis versus pneumonia.  Central peribronchial cuffing can be seen with viral infection or reactive airway disease.  Cardiomegaly.  Original Report Authenticated By: Suanne Marker, M.D.        MDM   Patient with cough and congestion. Chest x-ray with central peribronchial cuffing, and mild right lung base hazy opacity. We'll initiate antibiotic treatment. Patient has received albuterol nebulized treatment, prednisone,  with improvement.Pt stable in ED with no significant deterioration in condition.The patient appears reasonably screened and/or stabilized for discharge and I doubt any other medical condition or other [facility] requiring further screening, evaluation, or treatment in the ED at this time prior to discharge.  MDM Reviewed: nursing note and vitals Interpretation: x-ray          Gypsy Balsam. Olin Hauser, MD 11/30/11 0330

## 2011-12-31 ENCOUNTER — Inpatient Hospital Stay (HOSPITAL_COMMUNITY): Payer: Self-pay

## 2011-12-31 ENCOUNTER — Inpatient Hospital Stay (HOSPITAL_COMMUNITY)
Admission: EM | Admit: 2011-12-31 | Discharge: 2012-01-02 | DRG: 292 | Disposition: A | Discharge status: Home / Self Care | Payer: MEDICAID | Attending: Internal Medicine | Admitting: Internal Medicine

## 2011-12-31 ENCOUNTER — Encounter (HOSPITAL_COMMUNITY): Payer: Self-pay | Admitting: *Deleted

## 2011-12-31 ENCOUNTER — Emergency Department (HOSPITAL_COMMUNITY): Payer: Self-pay

## 2011-12-31 DIAGNOSIS — I428 Other cardiomyopathies: Secondary | ICD-10-CM | POA: Diagnosis present

## 2011-12-31 DIAGNOSIS — I509 Heart failure, unspecified: Secondary | ICD-10-CM | POA: Diagnosis present

## 2011-12-31 DIAGNOSIS — Z7982 Long term (current) use of aspirin: Secondary | ICD-10-CM

## 2011-12-31 DIAGNOSIS — I517 Cardiomegaly: Secondary | ICD-10-CM | POA: Diagnosis present

## 2011-12-31 DIAGNOSIS — Z79899 Other long term (current) drug therapy: Secondary | ICD-10-CM

## 2011-12-31 DIAGNOSIS — R778 Other specified abnormalities of plasma proteins: Secondary | ICD-10-CM | POA: Diagnosis present

## 2011-12-31 DIAGNOSIS — Z8701 Personal history of pneumonia (recurrent): Secondary | ICD-10-CM

## 2011-12-31 DIAGNOSIS — I5023 Acute on chronic systolic (congestive) heart failure: Secondary | ICD-10-CM

## 2011-12-31 DIAGNOSIS — R079 Chest pain, unspecified: Secondary | ICD-10-CM

## 2011-12-31 DIAGNOSIS — R0602 Shortness of breath: Secondary | ICD-10-CM | POA: Diagnosis present

## 2011-12-31 DIAGNOSIS — N179 Acute kidney failure, unspecified: Secondary | ICD-10-CM | POA: Diagnosis present

## 2011-12-31 DIAGNOSIS — I5043 Acute on chronic combined systolic (congestive) and diastolic (congestive) heart failure: Principal | ICD-10-CM | POA: Diagnosis present

## 2011-12-31 DIAGNOSIS — I429 Cardiomyopathy, unspecified: Secondary | ICD-10-CM

## 2011-12-31 DIAGNOSIS — E876 Hypokalemia: Secondary | ICD-10-CM | POA: Diagnosis present

## 2011-12-31 DIAGNOSIS — N189 Chronic kidney disease, unspecified: Secondary | ICD-10-CM | POA: Diagnosis present

## 2011-12-31 DIAGNOSIS — R7989 Other specified abnormal findings of blood chemistry: Secondary | ICD-10-CM | POA: Diagnosis present

## 2011-12-31 LAB — BASIC METABOLIC PANEL
Calcium: 9.2 mg/dL (ref 8.4–10.5)
Chloride: 98 mEq/L (ref 96–112)
Creatinine, Ser: 1.97 mg/dL — ABNORMAL HIGH (ref 0.50–1.35)
GFR calc Af Amer: 51 mL/min — ABNORMAL LOW (ref >90)
GFR calc Af Amer: 51 mL/min — ABNORMAL LOW (ref >90)
Potassium: 2.8 mEq/L — ABNORMAL LOW (ref 3.5–5.1)
Sodium: 134 mEq/L — ABNORMAL LOW (ref 135–145)

## 2011-12-31 LAB — CARDIAC PANEL(CRET KIN+CKTOT+MB+TROPI)
CK, MB: 17.1 ng/mL (ref 0.3–4.0)
Relative Index: 3.4 — ABNORMAL HIGH (ref 0.0–2.5)
Total CK: 394 U/L — ABNORMAL HIGH (ref 7–232)
Total CK: 429 U/L — ABNORMAL HIGH (ref 7–232)
Troponin I: 1.31 ng/mL (ref <0.30)
Troponin I: 3.19 ng/mL (ref <0.30)

## 2011-12-31 LAB — CBC
MCH: 27.3 pg (ref 26.0–34.0)
MCV: 79.2 fL (ref 78.0–100.0)
Platelets: 248 10*3/uL (ref 150–400)
RBC: 4.72 MIL/uL (ref 4.22–5.81)
RDW: 13.7 % (ref 11.5–15.5)
WBC: 6.8 10*3/uL (ref 4.0–10.5)

## 2011-12-31 LAB — URINALYSIS, ROUTINE W REFLEX MICROSCOPIC
Glucose, UA: NEGATIVE mg/dL
Ketones, ur: NEGATIVE mg/dL
Leukocytes, UA: NEGATIVE
Nitrite: NEGATIVE
Specific Gravity, Urine: 1.005 (ref 1.005–1.030)
pH: 6 (ref 5.0–8.0)

## 2011-12-31 LAB — TROPONIN I: Troponin I: <0.3 ng/mL (ref <0.30)

## 2011-12-31 LAB — TSH: TSH: 1.836 u[IU]/mL (ref 0.350–4.500)

## 2011-12-31 LAB — SODIUM, URINE, RANDOM: Sodium, Ur: 36 mEq/L

## 2011-12-31 LAB — PRO B NATRIURETIC PEPTIDE: Pro B Natriuretic peptide (BNP): 3377 pg/mL — ABNORMAL HIGH (ref 0–125)

## 2011-12-31 LAB — URINE MICROSCOPIC-ADD ON

## 2011-12-31 MED ORDER — SODIUM CHLORIDE 0.9 % IJ SOLN
3.0000 mL | Freq: Two times a day (BID) | INTRAMUSCULAR | Status: DC
  Administered 2011-12-31 – 2012-01-01 (×4): 3 mL via INTRAVENOUS
  Filled 2011-12-31 (×4): qty 3

## 2011-12-31 MED ORDER — FUROSEMIDE 10 MG/ML IJ SOLN
40.0000 mg | Freq: Once | INTRAMUSCULAR | Status: AC
  Administered 2011-12-31: 40 mg via INTRAVENOUS
  Filled 2011-12-31: qty 4

## 2011-12-31 MED ORDER — METOPROLOL SUCCINATE ER 25 MG PO TB24
12.5000 mg | ORAL_TABLET | Freq: Two times a day (BID) | ORAL | Status: DC
  Administered 2011-12-31: 12.5 mg via ORAL
  Filled 2011-12-31: qty 1

## 2011-12-31 MED ORDER — HYDRALAZINE HCL 10 MG PO TABS
10.0000 mg | ORAL_TABLET | Freq: Four times a day (QID) | ORAL | Status: DC
  Filled 2011-12-31 (×6): qty 1

## 2011-12-31 MED ORDER — IPRATROPIUM BROMIDE 0.02 % IN SOLN
0.5000 mg | Freq: Once | RESPIRATORY_TRACT | Status: AC
  Administered 2011-12-31: 0.5 mg via RESPIRATORY_TRACT
  Filled 2011-12-31: qty 2.5

## 2011-12-31 MED ORDER — SODIUM CHLORIDE 0.9 % IJ SOLN
INTRAMUSCULAR | Status: AC
  Administered 2011-12-31: 3 mL
  Filled 2011-12-31: qty 3

## 2011-12-31 MED ORDER — ISOSORB DINITRATE-HYDRALAZINE 20-37.5 MG PO TABS
1.0000 | ORAL_TABLET | Freq: Three times a day (TID) | ORAL | Status: DC
  Administered 2011-12-31 – 2012-01-02 (×7): 1 via ORAL
  Filled 2011-12-31 (×13): qty 1

## 2011-12-31 MED ORDER — NITROGLYCERIN 2 % TD OINT
1.0000 [in_us] | TOPICAL_OINTMENT | Freq: Three times a day (TID) | TRANSDERMAL | Status: DC
  Administered 2011-12-31: 1 [in_us] via TOPICAL
  Filled 2011-12-31: qty 1

## 2011-12-31 MED ORDER — CARVEDILOL 3.125 MG PO TABS
6.2500 mg | ORAL_TABLET | Freq: Two times a day (BID) | ORAL | Status: DC
  Administered 2011-12-31 – 2012-01-01 (×3): 6.25 mg via ORAL
  Filled 2011-12-31 (×3): qty 2

## 2011-12-31 MED ORDER — SODIUM CHLORIDE 0.9 % IJ SOLN
INTRAMUSCULAR | Status: AC
  Administered 2011-12-31: 10 mL
  Filled 2011-12-31: qty 3

## 2011-12-31 MED ORDER — POTASSIUM CHLORIDE CRYS ER 20 MEQ PO TBCR
40.0000 meq | EXTENDED_RELEASE_TABLET | Freq: Two times a day (BID) | ORAL | Status: AC
  Administered 2011-12-31 (×2): 40 meq via ORAL
  Filled 2011-12-31 (×2): qty 2

## 2011-12-31 MED ORDER — ALBUTEROL SULFATE HFA 108 (90 BASE) MCG/ACT IN AERS: 1.0000 | INHALATION_SPRAY | Freq: Four times a day (QID) | RESPIRATORY_TRACT | Status: DC | PRN

## 2011-12-31 MED ORDER — FUROSEMIDE 10 MG/ML IJ SOLN
60.0000 mg | Freq: Once | INTRAMUSCULAR | Status: AC
  Administered 2011-12-31: 60 mg via INTRAVENOUS
  Filled 2011-12-31: qty 6

## 2011-12-31 MED ORDER — FUROSEMIDE 10 MG/ML IJ SOLN
60.0000 mg | Freq: Two times a day (BID) | INTRAMUSCULAR | Status: DC
  Administered 2011-12-31 – 2012-01-01 (×3): 60 mg via INTRAVENOUS
  Filled 2011-12-31: qty 6
  Filled 2011-12-31: qty 2
  Filled 2011-12-31: qty 4
  Filled 2011-12-31: qty 6

## 2011-12-31 MED ORDER — ALBUTEROL SULFATE (5 MG/ML) 0.5% IN NEBU
5.0000 mg | INHALATION_SOLUTION | Freq: Once | RESPIRATORY_TRACT | Status: AC
  Administered 2011-12-31: 5 mg via RESPIRATORY_TRACT
  Filled 2011-12-31: qty 1

## 2011-12-31 NOTE — H&P (Addendum)
[name] [name] is an 31 y.o. male.   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 +sob, + cough, yellow sputum, denies fever, chills, palp, n/v, diarrhea, brbpr, black stool.     In ED given lasix iv. With benefit.  Also tx with breathing tx without much benefit.  ED requests admission for cp and also sob.    Past Medical History  Diagnosis Date  . Pneumonia   . Cardiomyopathy     Echo 11/23/2010=>EF <20%, mod MR    Past Surgical History  Procedure Date  . Surgery scrotal / testicular     testicular torsion  . Cardiac catheterization 11/25/2010    no cad    Family History  Problem Relation Age of Onset  . Breast cancer Mother   . Stroke Father    Social History:  reports that he has never smoked. He does not have any smokeless tobacco history on file. He reports that he does not drink alcohol or use illicit drugs.  Allergies: No Known Allergies   (Not in a hospital admission)  Results for orders placed during the hospital encounter of 12/31/11 (from the past 48 hour(s))  CBC     Status: Abnormal   Collection Time   12/31/11  2:43 AM      Component Value Range Comment   WBC 6.8  4.0 - 10.5 K/uL    RBC 4.72  4.22 - 5.81 MIL/uL    Hemoglobin 12.9 (*) 13.0 - 17.0 g/dL    HCT 37.4 (*) 39.0 - 52.0 %    MCV 79.2  78.0 - 100.0 fL    MCH 27.3  26.0 - 34.0 pg    MCHC 34.5  30.0 - 36.0 g/dL    RDW 13.7  11.5 - 15.5 %    Platelets 248  150 - 400 K/uL   BASIC METABOLIC PANEL     Status: Abnormal   Collection Time   12/31/11  2:43 AM      Component Value Range Comment   Sodium 134 (*) 135 - 145 mEq/L    Potassium 3.2 (*) 3.5 - 5.1 mEq/L    Chloride 99  96 - 112 mEq/L    CO2 21  19 - 32 mEq/L    Glucose, Bld 157 (*) 70 - 99 mg/dL    BUN 21  6 - 23 mg/dL    Creatinine, Ser 1.97 (*) 0.50 - 1.35 mg/dL    Calcium 9.2  8.4 - 10.5 mg/dL    GFR calc non Af Amer 44 (*) >90 mL/min    GFR calc Af Amer  51 (*) >90 mL/min   TROPONIN I     Status: Normal   Collection Time   12/31/11  2:43 AM      Component Value Range Comment   Troponin I <0.30  <0.30 ng/mL   PRO B NATRIURETIC PEPTIDE     Status: Abnormal   Collection Time   12/31/11  2:43 AM      Component Value Range Comment   Pro B Natriuretic peptide (BNP) 3377.0 (*) 0 - 125 pg/mL    Chest Portable 1 View  12/31/2011  *RADIOLOGY REPORT*  Clinical Data: Mid chest pain and shortness of breath.  PORTABLE CHEST - 1 VIEW  Comparison: Chest radiograph performed 11/30/2011  Findings: There is vascular congestion, with increased interstitial markings, compatible with mild interstitial edema.  No pleural  effusion or pneumothorax is seen.  The cardiomediastinal silhouette is enlarged, mildly more prominent than on the prior study.  No acute osseous abnormalities are identified.  IMPRESSION: Vascular congestion and mildly worsening cardiomegaly, with increased interstitial markings, compatible with mild interstitial edema.  Given clinical concern and recent viral infection, this could reflect viral cardiomyopathy.  Original Report Authenticated By: Santa Lighter, M.D.    Review of Systems  Constitutional: Negative for fever, chills, weight loss, malaise/fatigue and diaphoresis.  HENT: Negative for hearing loss, ear pain, nosebleeds, congestion, neck pain, tinnitus and ear discharge.   Eyes: Negative for blurred vision, double vision, photophobia, pain, discharge and redness.  Respiratory: Positive for cough, sputum production and shortness of breath. Negative for hemoptysis, wheezing and stridor.   Cardiovascular: Positive for chest pain and orthopnea. Negative for palpitations, claudication, leg swelling and PND.  Gastrointestinal: Negative for heartburn, nausea, vomiting, abdominal pain, diarrhea, constipation, blood in stool and melena.  Genitourinary: Negative for dysuria, urgency, frequency, hematuria and flank pain.  Musculoskeletal: Negative for  myalgias, back pain, joint pain and falls.  Skin: Negative for itching and rash.  Neurological: Negative for dizziness, tingling, tremors, sensory change, speech change, focal weakness, seizures, loss of consciousness, weakness and headaches.  Endo/Heme/Allergies: Negative for environmental allergies and polydipsia. Does not bruise/bleed easily.  Psychiatric/Behavioral: Negative for depression, suicidal ideas, hallucinations, memory loss and substance abuse. The patient is not nervous/anxious and does not have insomnia.     Blood pressure 159/115, pulse 109, temperature 97.7 F (36.5 C), resp. rate 26, height 5\' 10"  (1.778 m), weight 92.987 kg (205 lb), SpO2 97.00%. Physical Exam  Constitutional: He is oriented to person, place, and time. He appears well-developed and well-nourished. No distress.  HENT:  Head: Normocephalic and atraumatic.  Mouth/Throat: No oropharyngeal exudate.  Eyes: Conjunctivae and EOM are normal. Pupils are equal, round, and reactive to light. Right eye exhibits no discharge. Left eye exhibits no discharge. No scleral icterus.  Neck: Normal range of motion. Neck supple. JVD present. No tracheal deviation present. No thyromegaly present.  Cardiovascular: Normal rate, regular rhythm and normal heart sounds.  Exam reveals no gallop and no friction rub.   No murmur heard. Respiratory: Effort normal and breath sounds normal. No stridor. No respiratory distress. He has no wheezes. He has no rales. He exhibits no tenderness.  GI: Soft. Bowel sounds are normal. He exhibits no distension and no mass. There is no tenderness. There is no rebound and no guarding.  Musculoskeletal: Normal range of motion. He exhibits no edema and no tenderness.  Lymphadenopathy:    He has no cervical adenopathy.  Neurological: He is alert and oriented to person, place, and time. He has normal reflexes. He displays normal reflexes. No cranial nerve deficit. He exhibits normal muscle tone. Coordination  normal.  Skin: Skin is warm and dry. No rash noted. He is not diaphoretic. No erythema. No pallor.  Psychiatric: He has a normal mood and affect. His behavior is normal. Judgment and thought content normal.     Assessment/Plan Cp Sob Cough, Bronchitis Tachycardia ARF  Tele cpk mb, trop, q6h x3 Echo NTP Hydralazine Lasix Albuterol neb tsh zpak Renal u.s,  Urine sodium, urine creatinine, urine eosinophils, ua   [name] [name] 12/31/2011, 5:35 AM

## 2011-12-31 NOTE — Consult Note (Signed)
CARDIOLOGY CONSULT NOTE  Patient ID: [name] [name] MRN: [id number] DOB/AGE: [birth year] 31 y.o.  Admit date: 12/31/2011 Referring Physician: PTH Primary PhysicianMargaret Moshe Cipro, MD Primary Cardiologist: [name] [name] [facility]) Reason for Consultation:  Principal Problem:  *SOB (shortness of breath) Active Problems:  Acute on chronic systolic CHF (congestive heart failure)  Hypokalemia  Chest pain  CKD (chronic kidney disease)  Renal failure (ARF), acute on chronic  LVH (left ventricular hypertrophy)  HPI: [name] [name] is a 31 year old male patient formerly followed by [facility] that had been dismissed from the practice. He is a known history of nonischemic cardiomyopathy with most recent EF of 15%. Head cardiac catheterization by [name] [name] in May of 2012 revealing normal coronary arteries with diminished LVEF. Followed up with [name] [name] after catheterization, and had been on 2 medications. The patient states that he had a second echocardiogram that revealed normal function and was taken off of both medications. This occurred at the end of the summer of 2012. The patient has been on no medications prior to this admission since that time.    He was seen in the emergency room in May 2013. with complaints of shortness of breath over several days with PND. He was diagnosed with pneumonia and started on antibiotic therapy. Chest x-ray at that time revealed central peribronchial cuffing which could be seen with viral infection or reactive airway disease. Cardiomegaly was noted on that chest x-ray.        He was admitted with acute systolic CHF and chest discomfort. He was found to be hypertensive on admission with blood pressure of 188/138. BNP 3377 on admission. Chest x-ray revealed vascular congestion, worsening cardiomegaly, and interstitial edema. He was treated with IV Lasix 60 mg hydralazine 10 mg and breathing treatments in the ER. He is diuresed about 3000 cc since  admission. Cardiac enzymes were found to be positive with a troponin of less than 3.0 on admission but trending up words to 1.31, and 3.19 respectively. CK-MB was also elevated, rising from 13.3-17.1.     The patient also has a prior history of chronic kidney disease, with echogenic kidneys bilaterally. Creatinine 1.97 on admission, with a potassium at 3.2. He was not found to be anemic. The patient denies , chest pain, edema, abdominal distention, dizziness or nausea or vomiting, his main complaint was shortness of breath. He is currently comfortable and not not short of breath, and is without complaint.  Review of systems complete and found to be negative unless listed above   Past Medical History  Diagnosis Date  . Pneumonia   . Cardiomyopathy     Echo 11/23/2010=>EF <20%, mod MR  . Acute on chronic systolic CHF (congestive heart failure) 12/31/2011  . CKD (chronic kidney disease)     Cr 1.6-1.7 May/2012  . LVH (left ventricular hypertrophy) 12/31/2011  . Hypertension   . Anginal pain     Family History  Problem Relation Age of Onset  . Breast cancer Mother   . Stroke Father     History   Social History  . Marital Status: Single    Spouse Name: N/A    Number of Children: N/A  . Years of Education: N/A   Occupational History  . Not on file.   Social History Main Topics  . Smoking status: Never Smoker   . Smokeless tobacco: Not on file  . Alcohol Use: No  . Drug Use: No  . Sexually Active: Yes    Birth Control/ Protection:  Condom   Other Topics Concern  . Not on file   Social History Narrative   Occupation: just started sanitation job cleaning    Past Surgical History  Procedure Date  . Surgery scrotal / testicular     testicular torsion  . Cardiac catheterization 11/25/2010    no cad     Prescriptions prior to admission  Medication Sig Dispense Refill  . albuterol (PROVENTIL) (2.5 MG/3ML) 0.083% nebulizer solution Take 2.5 mg by nebulization every 6 (six) hours  as needed. For shortness of breath      . aspirin 325 MG tablet Take 325 mg by mouth as needed. For pain      . Aspirin-Acetaminophen-Caffeine (GOODYS EXTRA STRENGTH) 500-325-65 MG PACK Take 1 Package by mouth as needed. For pain      . ibuprofen (ADVIL,MOTRIN) 200 MG tablet Take 800 mg by mouth every 6 (six) hours as needed. For pain      . Pseudoeph-Doxylamine-DM-APAP (NYQUIL PO) Take 2 capsules by mouth at bedtime as needed. For cold symptoms      . Pseudoephedrine-APAP-DM (DAYQUIL PO) Take 2 capsules by mouth daily as needed. For cold symptoms       Echocardiogram: 12/31/2011 Left ventricle: The cavity size was mild tomoderatelydilated. There was mild concentric hypertrophy and borderline eccentric remodelling. Severe global hypokinesis, LVEF <20%. Doppler parameters are consistent with restrictive left ventricular relaxation (grade3 diastolic dysfunction). The E/A ratio is >2. The E/e' ratio is >25, suggesting markedly elevated LV filling pressure. - Aortic valve: Sclerosis without stenosis. Trivial central regurgitation. - Mitral valve: Dilated annulus. Moderate regurgitation. - Left atrium: Severlely dilated. - Right atrium: The atrium was mildly dilated. - Tricuspid valve: Mildly thickened leaflets. Trivial TR. No good TR envelope to measure RVSP. - Systemic veins: The IVC is small (<1.2 cm) and collapses spontaneously, suggesting a low RA pressure of 2 mmHG and intravascular dehydration. - Pericardium, extracardiac: There was no pericardial effusion.  Cardiac Catheterization: 5/12 FINDINGS:  1. Left main - no disease.  2. LAD - no disease.  3. Left circumflex - large dominant vessel. No disease.  4. RCA - small vessel with no disease.  5. LVEDP = 18 mmHg.  Right heart:  1. RA - 8.  2. RV - 44/10.  3. PA - 43/26 (34).  4. PCWP - 29.  5. PA sat % - 62%.  6. AO sat% - 93%.  7. Fick cardiac output/cardiac index - 5.09/2.45.  8. Thermodilution cardiac output/cardiac  index - 5.2/2.5.  IMPRESSION:  1. No evidence of coronary artery disease.  2. LVEDP = 18 mmHg.  3. Mildly reduced cardiac output and index with good pulmonary artery  saturation and elevated PCWP.  Physical Exam: Blood pressure 135/80, pulse 92, temperature 98.2 F (36.8 C), temperature source Oral, resp. rate 20, height 5\' 10"  (1.778 m), weight 205 lb (92.987 kg), SpO2 96.00%.   General: Well developed, well nourished, in no acute distress Head: Eyes PERRLA, No xanthomas.   Normal cephalic and atramatic  Lungs: Clear bilaterally to auscultation and percussion. Heart: HRRR S1 S2, distant heart sounds.  Pulses are 2+ & equal.            No carotid bruit. No JVD.  No abdominal bruits. No femoral bruits. Abdomen: Bowel sounds are positive, abdomen soft and non-tender without masses or                  Hernia's noted. Msk:  Back normal, normal gait. Normal strength and tone  for age. Extremities: No clubbing, cyanosis or edema.  DP +1 Neuro: Alert and oriented X 3. Psych:  Good affect, responds appropriately   Lab Results  Component Value Date   WBC 6.8 12/31/2011   HGB 12.9* 12/31/2011   HCT 37.4* 12/31/2011   MCV 79.2 12/31/2011   PLT 248 12/31/2011    Lab 12/31/11 0243  NA 134*  K 3.2*  CL 99  CO2 21  BUN 21  CREATININE 1.97*  CALCIUM 9.2  PROT --  BILITOT --  ALKPHOS --  ALT --  AST --  GLUCOSE 157*   Lab Results  Component Value Date   CKTOTAL 429* 12/31/2011   CKMB 17.1* 12/31/2011   TROPONINI 3.19* 12/31/2011   Radiology: US Renal  12/31/2011  *RADIOLOGY REPORT*  Clinical Data: Acute renal failure.  Hypertension.  RENAL/URINARY TRACT ULTRASOUND COMPLETE  Comparison:  None.  Findings:  Right Kidney:  10.7 cm.  Slightly echogenic when compared to the adjacent liver.  Left Kidney:  10.6 cm.  Diffusely increased echotexture, similar to the contralateral side.  Bladder:  Normal urinary bladder.  IMPRESSION: No renal obstruction or hydronephrosis.  Echogenic kidneys  bilaterally. This is nonspecific, commonly associated with acute renal failure, glomerulonephritis or diabetes.  Original Report Authenticated By: Dereck Ligas, M.D.   Chest Portable 1 View  12/31/2011  *RADIOLOGY REPORT*  Clinical Data: Mid chest pain and shortness of breath.  PORTABLE CHEST - 1 VIEW  Comparison: Chest radiograph performed 11/30/2011  Findings: There is vascular congestion, with increased interstitial markings, compatible with mild interstitial edema.  No pleural effusion or pneumothorax is seen.  The cardiomediastinal silhouette is enlarged, mildly more prominent than on the prior study.  No acute osseous abnormalities are identified.  IMPRESSION: Vascular congestion and mildly worsening cardiomegaly, with increased interstitial markings, compatible with mild interstitial edema.  Given clinical concern and recent viral infection, this could reflect viral cardiomyopathy.  Original Report Authenticated By: Santa Lighter, M.D.   EKG: Sinus tachycardia, LVH, with T-wave flattening in the lateral leads.  ASSESSMENT AND PLAN:   1. Acute Systolic CHF: He has a past medical history of nonischemic cardiomyopathy with cardiac catheterization completed in May of 2012 by [facility] revealing normal coronary anatomy. At that time his EF was less than 20%. He states he was taken off of diuretics and a second medication as his LV function apparently had recovered. Currently his EF is now 20%. He is on IV diuretics, Lasix 60 mg twice a day. He has begun to diuresis. He has been started on by deal, and metoprolol 12.5 mg twice a day. We will discontinue metoprolol and changed to carvedilol, 6.25 mg twice a day. He will benefit from an ACE inhibitor once renal function has recovered.   2.NSTEMI Type II : Do not believe this to be related to coronary artery disease as he has had a normal cardiac catheterization one year ago. Most likely related to low output state vs. chronic  renal insufficiency. Would not proceed with any ischemic testing at this time and would continue diuresis. He did have a recent illness which was thought to be pneumonia 1 month ago, treated with antibiotics. At that time chest x-ray did show some cardiomegaly. He has not been seen by [facility] since the summer of 2012.   3. Chronic Kidney Disease:  Creatinine has never been less than 1.44 since he was initially seen in 10/2010, and is generally 1.6-1.8, so he is  close to baseline.  Once volume status is optimal we will add ACE-I.  Start hydralazine and nitrates now.  Phill Myron. Purcell Nails NP [facility] 12/31/2011, 5:31 PM  Cardiology Attending Patient interviewed and examined. Discussed with Jory Sims, NP.  Above note annotated and modified based upon my findings.  Subacute onset of recurrent CHF in this gentleman with a history of non-ischemic cardiomyopathy.  It appears that his myopathic process resolved with return of EF to normal, but now has likely recurred-echo is pending.  We will optimize meds and refer to the CHF program at [facility] as an outpatient.  Jacqulyn Ducking, MD 12/31/2011, 7:36 PM

## 2011-12-31 NOTE — Care Management Note (Unsigned)
    Page 1 of 1   12/31/2011     11:39:38 AM   CARE MANAGEMENT NOTE 12/31/2011  Patient:  [name] [name], [name] [name]   Account Number:  [id number]  Date Initiated:  12/31/2011  Documentation initiated by:  Claretha Cooper  Subjective/Objective Assessment:   Pt admitted with cp and SOB. PTA lives at home with parent. No HH PTA.     Action/Plan:   Pt self pay and there is an order for medication assistance. At DC will evaluate ability and needs.   Anticipated DC Date:  01/01/2012   Anticipated DC Plan:  [place]  CM consult      Choice offered to / List presented to:             Status of service:  In process, will continue to follow Medicare Important Message given?   (If response is "NO", the following Medicare IM given date fields will be blank) Date Medicare IM given:   Date Additional Medicare IM given:    Discharge Disposition:    Per UR Regulation:    If discussed at Long Length of Stay Meetings, dates discussed:    Comments:  12/31/11 [place]

## 2011-12-31 NOTE — ED Provider Notes (Addendum)
History     CSN: [id number]  Arrival date & time 12/31/11  0222   First MD Initiated Contact with Patient 12/31/11 0230      Chief Complaint  Patient presents with  . Chest Pain  . Shortness of Breath    (Consider location/radiation/quality/duration/timing/severity/associated sxs/prior treatment) HPI [name] [name] is a 31 y.o. male with a h/o hypertension.who presents to the Emergency Department complaining of progressive and continued shortness of breath since being seen in the ER 5/20 2013. At that time he was diagnosed with a bronchitis, received antibiotics, and prednisone. Since that time he has been progressive more short of breath with a chronic dry cough. He is more short of breath at night when lying down and there is increased shortness of breath with exertion.Review of previous records show that he was admitted 11/23/2010 with Acute systolic heart failures/p cath with clean coronaries. 2 D Echo with  nonischemic cardiomyopathy with ejection fraction 15-20%. He went through part  [place] re[facility] and has been lost to follow up due to medical non compliance.   Past Medical History  Diagnosis Date  . Pneumonia     Past Surgical History  Procedure Date  . Surgery scrotal / testicular     testicular torsion    History reviewed. No pertinent family history.  History  Substance Use Topics  . Smoking status: Never Smoker   . Smokeless tobacco: Not on file  . Alcohol Use: No      Review of Systems  Constitutional: Negative for fever.       10 Systems reviewed and are negative for acute change except as noted in the HPI.  HENT: Negative for congestion.   Eyes: Negative for discharge and redness.  Respiratory: Positive for cough and shortness of breath.   Cardiovascular: Positive for chest pain.  Gastrointestinal: Negative for vomiting and abdominal pain.  Musculoskeletal: Negative for back pain.  Skin: Negative for rash.  Neurological: Negative for syncope,  numbness and headaches.  Psychiatric/Behavioral:       No behavior change.    Allergies  Review of patient's allergies indicates no known allergies.  Home Medications   Current Outpatient Rx  Name Route Sig Dispense Refill  . ALBUTEROL SULFATE HFA 108 (90 BASE) MCG/ACT IN AERS Inhalation Inhale 1-2 puffs into the lungs every 6 (six) hours as needed for wheezing. 1 Inhaler 0    BP 188/138  Pulse 117  Temp 97.7 F (36.5 C)  Resp 18  Ht 5\' 10"  (1.778 m)  Wt 205 lb (92.987 kg)  BMI 29.41 kg/m2  SpO2 100%  Physical Exam  Nursing note and vitals reviewed. Constitutional: He is oriented to person, place, and time. He appears well-developed and well-nourished. No distress.       Awake, alert, nontoxic appearance.  HENT:  Head: Normocephalic and atraumatic.  Eyes: Right eye exhibits no discharge. Left eye exhibits no discharge.  Neck: Neck supple.  Cardiovascular: Normal rate and regular rhythm.   Murmur heard. Pulmonary/Chest: Effort normal. He exhibits no tenderness.       Rales at both bases, right greater than left. Dry cough  Abdominal: Soft. There is no tenderness. There is no rebound.  Musculoskeletal: He exhibits no tenderness.       Baseline ROM, no obvious new focal weakness.  Neurological: He is alert and oriented to person, place, and time.       Mental status and motor strength appears baseline for patient and situation.  Skin: No rash noted.  Psychiatric: He has a normal mood and affect.    ED Course  Procedures (including critical care time)  Labs Reviewed  CBC - Abnormal; Notable for the following:    Hemoglobin 12.9 (*)     HCT 37.4 (*)     All other components within normal limits  BASIC METABOLIC PANEL - Abnormal; Notable for the following:    Sodium 134 (*)     Potassium 3.2 (*)     Glucose, Bld 157 (*)     Creatinine, Ser 1.97 (*)     GFR calc non Af Amer 44 (*)     GFR calc Af Amer 51 (*)     All other components within normal limits  PRO B  NATRIURETIC PEPTIDE - Abnormal; Notable for the following:    Pro B Natriuretic peptide (BNP) 3377.0 (*)     All other components within normal limits  TROPONIN I   Chest Portable 1 View  12/31/2011  *RADIOLOGY REPORT*  Clinical Data: Mid chest pain and shortness of breath.  PORTABLE CHEST - 1 VIEW  Comparison: Chest radiograph performed 11/30/2011  Findings: There is vascular congestion, with increased interstitial markings, compatible with mild interstitial edema.  No pleural effusion or pneumothorax is seen.  The cardiomediastinal silhouette is enlarged, mildly more prominent than on the prior study.  No acute osseous abnormalities are identified.  IMPRESSION: Vascular congestion and mildly worsening cardiomegaly, with increased interstitial markings, compatible with mild interstitial edema.  Given clinical concern and recent viral infection, this could reflect viral cardiomyopathy.  Original Report Authenticated By: Santa Lighter, M.D.     Date: 12/31/2011  0229  Rate: 119  Rhythm: sinus tachycardia  QRS Axis: left  Intervals: normal  ST/T Wave abnormalities: nonspecific ST/T changes  Conduction Disutrbances:none  Narrative Interpretation:   Old EKG Reviewed: unchanged c/w 11/27/2010  Review of hospitalization records of admission 11/23/2010 with 2D Echo report:  2D echocardiogram showed ejection fraction less than 20% and  Doppler parameters were consistent with a restrictive stage III  diastolic physiology. Left atrium was severely dilated. Left ventricle  was moderately dilated with eccentric hypertrophy. Peak PA pressure 34  mmHg. 4:55 AM:  T/C to Dr. Maudie Mercury, hospitalist case discussed, including:  HPI, pertinent PM/SHx, VS/PE, dx testing, ED course and treatment.  He will see patient in the ER.  MDM  Patient with a history of nonischemic cardiomyopathy with an ejection fraction of less than 20% in May of 2013 here tonight with progressive shortness of breath for one month, orthopnea,  shortness of breath with exertion. BNP is 3377. Chest x-ray shows cardiomyopathy and vascular congestion consistent with pulmonary edema. Will arrange for admission.Spoke with Dr. Maudie Mercury, hospitalist, who will see the patient in the ER.Pt stable in ED with no significant deterioration in condition.The patient appears reasonably stabilized for admission considering the current resources, flow, and capabilities available in the ED at this time, and I doubt any other [facility] requiring further screening and/or treatment in the ED prior to admission.  MDM Reviewed: nursing note, vitals and previous chart Reviewed previous: labs, ECG and ultrasound (cath report) Interpretation: ECG and x-ray Total time providing critical care: 30 minutes.        Gypsy Balsam. Olin Hauser, MD 12/31/11 [place]. Olin Hauser, MD 12/31/11 [phone]

## 2011-12-31 NOTE — ED Notes (Signed)
MD at bedside.- Dr. Maudie Mercury, Hospitalist

## 2011-12-31 NOTE — Progress Notes (Signed)
*  PRELIMINARY RESULTS* Echocardiogram 2D Echocardiogram has been performed.  Wynonia Lawman 12/31/2011, 11:10 AM

## 2011-12-31 NOTE — ED Notes (Addendum)
Coughs frequently here in ED - patient states is productive at times - clear to yellow sputum.  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   Describes shortness of breath as : feeling like something is cutting off his breath and won't let his lungs expand.  Also has felt tired for past month and becomes short of breath with minimal exertion.    Monitor Sinus tach without ectopy.

## 2011-12-31 NOTE — Progress Notes (Signed)
The patient is a 31 year old man with a history significant for nonischemic cardiomyopathy, lobe who was admitted this morning for chest pain and shortness of breath. He was briefly seen and examined. His chart, laboratory studies, and vital signs were reviewed. We'll discontinue nitroglycerin paste and add Bidil 3 times a day. He no longer has chest pain. We'll discontinue scheduled hydralazine. We'll add small dose of Toprol-XL and titrate accordingly. Continue IV Lasix at 60 mg IV every 12 hours. Replete his potassium chloride was supplementation orally. The patient's renal ultrasound reveals echogenic kidneys but no hydronephrosis. Results of the 2-D echocardiogram pending. TSH is pending. His cardiac enzymes have become modestly elevated. [facility] consult cardiology. [facility] cardiology had seen the patient in the past, but will consult [place] [facility]ology for further evaluation and management.

## 2011-12-31 NOTE — ED Notes (Addendum)
Called to give report for room #305   Nurse in room with another patient - will call back.  (Hydralazine tablet 10mg  not available in ED Pyxis- will have RN administer on unit when patient transferred

## 2011-12-31 NOTE — ED Notes (Signed)
Pt reporting pain in left side of chest radiating into left shoulder.  Reports pain began about 4:30 this afternoon.  Reports SOB that has been going on for about 1 month.  Denies n/v.  Reports taking 3  325 mg aspirin, about 30 min ago.

## 2012-01-01 DIAGNOSIS — N179 Acute kidney failure, unspecified: Secondary | ICD-10-CM

## 2012-01-01 DIAGNOSIS — I5043 Acute on chronic combined systolic (congestive) and diastolic (congestive) heart failure: Secondary | ICD-10-CM

## 2012-01-01 DIAGNOSIS — R778 Other specified abnormalities of plasma proteins: Secondary | ICD-10-CM | POA: Diagnosis present

## 2012-01-01 DIAGNOSIS — R079 Chest pain, unspecified: Secondary | ICD-10-CM

## 2012-01-01 DIAGNOSIS — E876 Hypokalemia: Secondary | ICD-10-CM

## 2012-01-01 LAB — LIPID PANEL
Cholesterol: 185 mg/dL (ref 0–200)
LDL Cholesterol: 116 mg/dL — ABNORMAL HIGH (ref 0–99)
VLDL: 19 mg/dL (ref 0–40)

## 2012-01-01 LAB — HIV ANTIBODY (ROUTINE TESTING W REFLEX): HIV: NONREACTIVE

## 2012-01-01 LAB — COMPREHENSIVE METABOLIC PANEL
ALT: 22 U/L (ref 0–53)
Alkaline Phosphatase: 67 U/L (ref 39–117)
CO2: 21 mEq/L (ref 19–32)
Calcium: 8.9 mg/dL (ref 8.4–10.5)
GFR calc Af Amer: 57 mL/min — ABNORMAL LOW (ref >90)
GFR calc non Af Amer: 49 mL/min — ABNORMAL LOW (ref >90)
Glucose, Bld: 128 mg/dL — ABNORMAL HIGH (ref 70–99)
Sodium: 136 mEq/L (ref 135–145)

## 2012-01-01 LAB — CBC
HCT: 38.2 % — ABNORMAL LOW (ref 39.0–52.0)
Hemoglobin: 13.1 g/dL (ref 13.0–17.0)
MCV: 78.6 fL (ref 78.0–100.0)
Platelets: 259 10*3/uL (ref 150–400)
RBC: 4.86 MIL/uL (ref 4.22–5.81)
WBC: 7.7 10*3/uL (ref 4.0–10.5)

## 2012-01-01 LAB — DIFFERENTIAL
Eosinophils Relative: 3 % (ref 0–5)
Lymphocytes Relative: 22 % (ref 12–46)
Lymphs Abs: 1.7 10*3/uL (ref 0.7–4.0)

## 2012-01-01 MED ORDER — FUROSEMIDE 40 MG PO TABS
40.0000 mg | ORAL_TABLET | Freq: Two times a day (BID) | ORAL | Status: DC
  Administered 2012-01-01 – 2012-01-02 (×2): 40 mg via ORAL
  Filled 2012-01-01 (×2): qty 1

## 2012-01-01 MED ORDER — LISINOPRIL 5 MG PO TABS
2.5000 mg | ORAL_TABLET | Freq: Two times a day (BID) | ORAL | Status: DC
  Administered 2012-01-01: 2.5 mg via ORAL
  Filled 2012-01-01 (×3): qty 1

## 2012-01-01 MED ORDER — FUROSEMIDE 8 MG/ML PO SOLN
40.0000 mg | Freq: Two times a day (BID) | ORAL | Status: DC
  Filled 2012-01-01 (×6): qty 5

## 2012-01-01 MED ORDER — CARVEDILOL 12.5 MG PO TABS
12.5000 mg | ORAL_TABLET | Freq: Two times a day (BID) | ORAL | Status: DC
  Administered 2012-01-02: 12.5 mg via ORAL
  Filled 2012-01-01: qty 1

## 2012-01-01 MED ORDER — POTASSIUM CHLORIDE CRYS ER 20 MEQ PO TBCR
40.0000 meq | EXTENDED_RELEASE_TABLET | Freq: Four times a day (QID) | ORAL | Status: AC
  Administered 2012-01-01 (×4): 40 meq via ORAL
  Filled 2012-01-01 (×4): qty 2

## 2012-01-01 MED ORDER — SPIRONOLACTONE 25 MG PO TABS
25.0000 mg | ORAL_TABLET | Freq: Every day | ORAL | Status: DC
  Administered 2012-01-01 – 2012-01-02 (×2): 25 mg via ORAL
  Filled 2012-01-01 (×2): qty 1

## 2012-01-01 NOTE — Progress Notes (Signed)
UR Chart Review Completed  

## 2012-01-01 NOTE — Progress Notes (Signed)
[name] [name]  31 y.o.  male  Subjective: Dyspnea improved; no chest discomfort  Allergy: Review of patient's allergies indicates no known allergies.  Objective: Vital signs in last 24 hours: Temp:  [97.8 F (36.6 C)-98.3 F (36.8 C)] 98.2 F (36.8 C) (06/21 1400) Pulse Rate:  [98-104] 104  (06/21 1400) Resp:  [20] 20  (06/21 1400) BP: (126-143)/(73-93) 128/73 mmHg (06/21 1400) SpO2:  [98 %-99 %] 98 % (06/21 1400) Weight:  [87.68 kg (193 lb 4.8 oz)] 87.68 kg (193 lb 4.8 oz) (06/21 0507)  87.68 kg (193 lb 4.8 oz) Body mass index is 27.74 kg/(m^2).  Weight change: -0 kg (-0 oz) Last BM Date: 12/31/11  Intake/Output from previous day: 06/20 0701 - 06/21 0700 In: 720 [P.O.:720] Out: 2351 [Urine:2350; Stool:1]  Total I&O since admission: -3.2 L; -1.5 L over past 24 hours; Weight: 87.7 kg, decreased 5 kg  General- Well developed; no acute distress  Neck- No JVD, no carotid bruits Lungs- clear lung fields; normal I:E ratio Cardiovascular- normal PMI; increased S1 and normal S2; modest systolic murmur Abdomen- normal bowel sounds; soft and non-tender without masses or organomegaly Skin- Warm, no significant lesions Extremities- Nl distal pulses; no edema  Lab Results: Cardiac Markers:   Basename 12/31/11 2033 12/31/11 1457  TROPONINI 4.42* 3.19*   CBC:   Basename 01/01/12 0514 12/31/11 0243  WBC 7.7 6.8  HGB 13.1 12.9*  HCT 38.2* 37.4*  PLT 259 248   BMET:  Basename 01/01/12 0514 12/31/11 1817  NA 136 136  K 2.9* 2.8*  CL 99 98  CO2 21 22  GLUCOSE 128* 109*  BUN 20 20  CREATININE 1.80* 1.97*  CALCIUM 8.9 8.9   Hepatic Function:   Basename 01/01/12 0514  PROT 7.1  ALBUMIN 3.7  AST 33  ALT 22  ALKPHOS 67  BILITOT 1.0  BILIDIR --  IBILI --   GFR:  Estimated Creatinine Clearance: 67 ml/min (by C-G formula based on Cr of 1.8). Lipids:   Basename 01/01/12 0515  CHOL 185  TRIG 93  HDL 50   Imaging Studies/Results: US Renal 12/31/2011  IMPRESSION: No  renal obstruction or hydronephrosis.  Echogenic kidneys bilaterally. This is nonspecific, commonly associated with acute renal failure, glomerulonephritis or diabetes.    Chest Portable 1 View 12/31/2011 IMPRESSION: Vascular congestion and mildly worsening cardiomegaly, with increased interstitial markings, compatible with mild interstitial edema.  Given clinical concern and recent viral infection, this could reflect viral cardiomyopathy.  Original Report Authenticated By: Santa Lighter, M.D.   Echocardiogram: EF of 20%; mild to moderate LV enlargement; moderate MR  Imaging: Imaging results have been reviewed  Medications: I have reviewed the patient's current medications.   Assessment/Plan: Congestive heart failure: Improved with diuresis; severe left ventricular dysfunction has recurred; appropriate medications initiated.  Dose of carvedilol increased. Furosemide has been changed to oral therapy. ACE inhibitor will be started at the minimal dosage.Weight decreased 13 pounds since 11/30/11.  BNP significantly lower, but still elevated to 1252. Hypokalemia persists despite addition of spironolactone, and substantial oral replacement therapy has been ordered. Renal function improved and approximately back to baseline.  Agree with plans for discharge tomorrow with followup in our office within 1-2 weeks. Patient will monitor weight at home.  LOS: 1 day   Jacqulyn Ducking 01/01/2012, 5:58 PM

## 2012-01-01 NOTE — Progress Notes (Signed)
01/01/12 1521 Order placed to schedule appointment at [facility] in [place] for next Friday. Notified clinic of need for appointment, stated scheduler gone for the day and would call on Monday to schedule. Given patient phone number in case of discharge. Stated would call patient on Monday. Contact information for heart failure clinic placed on discharge instructions and discussed with patient this afternoon. Verbalized understanding. Cindie Crumbly

## 2012-01-01 NOTE — Clinical Social Work Note (Signed)
Called [name] as patient is self pay and RN states he is having trouble paying for needed medical care.  [name] [name] has seen him already and states patient says he does not qualify for Medicaid as he is currently employed.  Will ask RN CM to [facility] if patient cannot afford medical care.    [name] [name] Clinical Social Worker [phone])

## 2012-01-01 NOTE — Progress Notes (Signed)
Chart reviewed.  Subjective: Feels better. Has ambulated without difficulty. No further chest pain.  Objective: Vital signs in last 24 hours: Filed Vitals:   12/31/11 1439 12/31/11 2011 12/31/11 2135 01/01/12 0507  BP: 135/80  126/81 143/93  Pulse: 92  98 100  Temp: 98.2 F (36.8 C)  97.8 F (36.6 C) 98.3 F (36.8 C)  TempSrc: Oral  Oral   Resp: 20  20 20   Height:      Weight:    87.68 kg (193 lb 4.8 oz)  SpO2: 96% 99% 98% 98%   Weight change: -0 kg (-0 oz)  Intake/Output Summary (Last 24 hours) at 01/01/12 1251 Last data filed at 01/01/12 0800  Gross per 24 hour  Intake    960 ml  Output   1351 ml  Net   -391 ml   General: Comfortable, eating lunch and watching TV Lungs diminished throughout without wheezes rhonchi or rales Cardiovascular regular rate rhythm with barely audible S4 gallop no murmurs Abdomen soft nontender nondistended Extremities no clubbing cyanosis or edema  Lab Results: Basic Metabolic Panel:  Lab [id number] 0514 12/31/11 1817  NA 136 136  K 2.9* 2.8*  CL 99 98  CO2 21 22  GLUCOSE 128* 109*  BUN 20 20  CREATININE 1.80* 1.97*  CALCIUM 8.9 8.9  MG 1.7 --  PHOS -- --   Liver Function Tests:  Lab 01/01/12 0514  AST 33  ALT 22  ALKPHOS 67  BILITOT 1.0  PROT 7.1  ALBUMIN 3.7   No results found for this basename: LIPASE:2,AMYLASE:2 in the last 168 hours No results found for this basename: AMMONIA:2 in the last 168 hours CBC:  Lab 01/01/12 0514 12/31/11 0243  WBC 7.7 6.8  NEUTROABS 5.3 --  HGB 13.1 12.9*  HCT 38.2* 37.4*  MCV 78.6 79.2  PLT 259 248   Cardiac Enzymes:  Lab 12/31/11 2033 12/31/11 1457 12/31/11 0912  CKTOTAL 413* 429* 394*  CKMB 14.2* 17.1* 13.3*  CKMBINDEX -- -- --  TROPONINI 4.42* 3.19* 1.31*   BNP:  Lab 01/01/12 0514 12/31/11 0243  PROBNP 1252.0* 3377.0*   D-Dimer: No results found for this basename: DDIMER:2 in the last 168 hours CBG: No results found for this basename: GLUCAP:6 in the last 168  hours Hemoglobin A1C: No results found for this basename: HGBA1C in the last 168 hours Fasting Lipid Panel:  Lab 01/01/12 0515  CHOL 185  HDL 50  LDLCALC 116*  TRIG 93  CHOLHDL 3.7  LDLDIRECT --   Thyroid Function Tests:  Lab 12/31/11 1457 12/31/11 0912  TSH -- 1.836  T4TOTAL -- --  FREET4 1.35 --  T3FREE -- --  THYROIDAB -- --   Coagulation: No results found for this basename: LABPROT:4,INR:4 in the last 168 hours Anemia Panel: No results found for this basename: VITAMINB12,FOLATE,FERRITIN,TIBC,IRON,RETICCTPCT in the last 168 hours Urine Drug Screen: Drugs of Abuse     Component Value Date/Time   LABOPIA NONE DETECTED 11/22/2010 2347   [place] 11/22/2010 2347   [place] 11/22/2010 2347   AMPHETMU NONE DETECTED 11/22/2010 2347   [employer] DETECTED 11/22/2010 2347   LABBARB  Value: [place].  IF CONFIRMATION IS NEEDED FOR ANY PURPOSE, NOTIFY LAB WITHIN 5 DAYS.        LOWEST DETECTABLE LIMITS FOR URINE DRUG SCREEN Drug Class       Cutoff (ng/mL) Amphetamine  1000 Barbiturate      200 Benzodiazepine   [id number] Tricyclics       [id number] Opiates          300 Cocaine          300 THC              50 11/22/2010 2347    Alcohol Level: No results found for this basename: ETH:2 in the last 168 hours Urinalysis:  Lab 12/31/11 0602  COLORURINE YELLOW  LABSPEC 1.005  PHURINE 6.0  GLUCOSEU NEGATIVE  HGBUR TRACE*  BILIRUBINUR NEGATIVE  KETONESUR NEGATIVE  PROTEINUR NEGATIVE  UROBILINOGEN 0.2  NITRITE NEGATIVE  LEUKOCYTESUR NEGATIVE    Micro Results: No results found for this or any previous visit (from the past 240 hour(s)). Studies/Results: US Renal  12/31/2011  *RADIOLOGY REPORT*  Clinical Data: Acute renal failure.  Hypertension.  RENAL/URINARY TRACT ULTRASOUND COMPLETE  Comparison:  None.  Findings:  Right Kidney:  10.7 cm.  Slightly echogenic when compared to the adjacent liver.  Left Kidney:  10.6  cm.  Diffusely increased echotexture, similar to the contralateral side.  Bladder:  Normal urinary bladder.  IMPRESSION: No renal obstruction or hydronephrosis.  Echogenic kidneys bilaterally. This is nonspecific, commonly associated with acute renal failure, glomerulonephritis or diabetes.  Original Report Authenticated By: Dereck Ligas, M.D.   Chest Portable 1 View  12/31/2011  *RADIOLOGY REPORT*  Clinical Data: Mid chest pain and shortness of breath.  PORTABLE CHEST - 1 VIEW  Comparison: Chest radiograph performed 11/30/2011  Findings: There is vascular congestion, with increased interstitial markings, compatible with mild interstitial edema.  No pleural effusion or pneumothorax is seen.  The cardiomediastinal silhouette is enlarged, mildly more prominent than on the prior study.  No acute osseous abnormalities are identified.  IMPRESSION: Vascular congestion and mildly worsening cardiomegaly, with increased interstitial markings, compatible with mild interstitial edema.  Given clinical concern and recent viral infection, this could reflect viral cardiomyopathy.  Original Report Authenticated By: Santa Lighter, M.D.   Scheduled Meds:   . carvedilol  6.25 mg Oral BID WC  . furosemide  40 mg Oral BID  . isosorbide-hydrALAZINE  1 tablet Oral TID  . potassium chloride  40 mEq Oral BID  . potassium chloride  40 mEq Oral QID  . sodium chloride  3 mL Intravenous Q12H  . sodium chloride      . spironolactone  25 mg Oral Daily  . DISCONTD: furosemide  60 mg Intravenous BID  . DISCONTD: metoprolol succinate  12.5 mg Oral BID   Continuous Infusions:  PRN Meds:.albuterol Assessment/Plan: Principal Problem:  *Acute on chronic combined systolic and diastolic CHF (congestive heart failure): Symptoms improved. Will change Lasix to by mouth. Add spironolactone and monitor potassium. Patient needs referral to heart failure clinic. I have paged them and await call back.   Chest pain, had a negative cardiac  catheterization last year and no further ischemic workup has been recommended by cardiology  Hypokalemia: Replete by mouth  CKD (chronic kidney disease)  Renal failure (ARF), acute on chronic  Elevated troponin, likely related to heart failure and renal failure. Unlikely to be acute coronary syndrome per cardiology.   LOS: 1 day   Aiza Vollrath L 01/01/2012, 12:51 PM

## 2012-01-02 DIAGNOSIS — R079 Chest pain, unspecified: Secondary | ICD-10-CM

## 2012-01-02 DIAGNOSIS — I5043 Acute on chronic combined systolic (congestive) and diastolic (congestive) heart failure: Secondary | ICD-10-CM

## 2012-01-02 LAB — BASIC METABOLIC PANEL
GFR calc Af Amer: 56 mL/min — ABNORMAL LOW (ref >90)
GFR calc non Af Amer: 48 mL/min — ABNORMAL LOW (ref >90)
Potassium: 4.5 mEq/L (ref 3.5–5.1)
Sodium: 135 mEq/L (ref 135–145)

## 2012-01-02 MED ORDER — SPIRONOLACTONE 25 MG PO TABS: 25.0000 mg | ORAL_TABLET | Freq: Every day | ORAL | Status: DC

## 2012-01-02 MED ORDER — FUROSEMIDE 40 MG PO TABS: 40.0000 mg | ORAL_TABLET | Freq: Every day | ORAL | Status: DC

## 2012-01-02 MED ORDER — CARVEDILOL 12.5 MG PO TABS: 12.5000 mg | ORAL_TABLET | Freq: Two times a day (BID) | ORAL | Status: DC

## 2012-01-02 MED ORDER — LISINOPRIL 2.5 MG PO TABS: 2.5000 mg | ORAL_TABLET | Freq: Every day | ORAL | Status: DC

## 2012-01-02 NOTE — Discharge Summary (Signed)
Physician Discharge Summary  Patient ID: [name] [name] MRN: [id number] DOB/AGE: 1981-01-25 31 y.o.  Admit date: 12/31/2011 Discharge date: 01/02/2012  Discharge Diagnoses:  Principal Problem:  *Acute on chronic combined systolic and diastolic CHF (congestive heart failure), nonischemic Active Problems:  Chest pain  Hypokalemia  CKD (chronic kidney disease)  Renal failure (ARF), acute on chronic  Elevated troponin secondary to CHF and renal insufficiency   Medication List  As of 01/02/2012  9:29 AM   STOP taking these medications         aspirin 325 MG tablet      DAYQUIL PO      GOODYS EXTRA STRENGTH 500-325-65 MG Pack      ibuprofen 200 MG tablet      NYQUIL PO         TAKE these medications         albuterol (2.5 MG/3ML) 0.083% nebulizer solution   Commonly known as: PROVENTIL   Take 2.5 mg by nebulization every 6 (six) hours as needed. For shortness of breath      carvedilol 12.5 MG tablet   Commonly known as: COREG   Take 1 tablet (12.5 mg total) by mouth 2 (two) times daily with a meal.      furosemide 40 MG tablet   Commonly known as: LASIX   Take 1 tablet (40 mg total) by mouth daily.      lisinopril 2.5 MG tablet   Commonly known as: PRINIVIL,ZESTRIL   Take 1 tablet (2.5 mg total) by mouth daily.      spironolactone 25 MG tablet   Commonly known as: ALDACTONE   Take 1 tablet (25 mg total) by mouth daily.            Discharge Orders    Future Orders Please Complete By Expires   Diet - low sodium heart healthy      Activity as tolerated - No restrictions         Follow-up Information    Call [facility] FREE.   Contact information:   May follow up at the [facility] in [place] at discharge. Phone number is [phone]. Call to see about appointment.       Schedule an appointment as soon as possible for a visit on 01/08/2012 to follow up. (Schedule appointment with the Heart Failure Clinic in [place] for next Friday  [phone] (office given has your phone number and should call you on monday about appointment. if you have not heard from them, please call. )          Disposition: 01-Home or Self Care  Discharged Condition: stable  Consults: Treatment Team:  Yehuda Savannah, MD  Labs:    Sodium        134 136 135    Potassium        3.2 2.9 4.5     Chloride        99 99 100    CO2        21 21 24     BUN        21 20 19     Creatinine, Ser        1.97 1.80 1.83    Calcium        9.2 8.9 9.6    GFR calc non Af Amer        44 49 48    GFR calc Af Amer        51  57  56  Glucose, Bld        157 128 95    Magnesium         1.7     Alkaline Phosphatase         67     Albumin         3.7     AST         33     ALT         22     Total Protein         7.1     Total Bilirubin         1.0      CARDIAC PROFILE    CK, MB         14.2      Total CK         413     Troponin I        <0.30  4.42      Pro B Natriuretic peptide (BNP)        3377.0 1252.0      LIPID PROFILE    Cholesterol         185     Triglycerides         93     HDL         50     LDL Cholesterol         116      VLDL         19     Total CHOL/HDL Ratio         3.7      CBC    WBC        6.8 7.7     RBC        4.72 4.86     Hemoglobin        12.9 13.1     HCT        37.4 38.2     MCV        79.2 78.6     MCH        27.3 27.0     MCHC        34.5 34.3     RDW        13.7 13.7     Platelets        248 259      DIFFERENTIAL    Neutrophils Relative         69     Lymphocytes Relative         22     Monocytes Relative         6     Eosinophils Relative         3     Basophils Relative         0     Neutro Abs         5.3     Lymphs Abs         1.7     Monocytes Absolute         0.4     Eosinophils Absolute         0.2     Basophils Absolute         0.0      DIABETES    Glucose, Bld        157 128 95     THYROID    TSH  1.836     Free T4         1.35      HIV TESTS    HIV          NON REACTIVE      URINALYSIS    Color, Urine        YELLOW      APPearance        CLEAR      Specific Gravity, Urine        1.005      pH        6.0      Glucose, UA        NEGATIVE      Bilirubin Urine        NEGATIVE      Ketones, ur        NEGATIVE      Protein, ur        NEGATIVE      Urobilinogen, UA        0.2      Nitrite        NEGATIVE      Leukocytes, UA        NEGATIVE      Hgb urine dipstick        TRACE      RBC / HPF        0-2       URINE CHEMISTRY    Calcium, Ur        8      Calcium/Creat.Ratio        0.1      Sodium, Ur        36      Creatinine, Urine        57.9       CREATINE 24 HR    Creatinine, Urine        57.9       CREATININE 24 HR    Creatinine, Urine        57.9       SODIUM 24 HR    Sodium, Ur        36     Diagnostics:  US Renal  12/31/2011  *RADIOLOGY REPORT*  Clinical Data: Acute renal failure.  Hypertension.  RENAL/URINARY TRACT ULTRASOUND COMPLETE  Comparison:  None.  Findings:  Right Kidney:  10.7 cm.  Slightly echogenic when compared to the adjacent liver.  Left Kidney:  10.6 cm.  Diffusely increased echotexture, similar to the contralateral side.  Bladder:  Normal urinary bladder.  IMPRESSION: No renal obstruction or hydronephrosis.  Echogenic kidneys bilaterally. This is nonspecific, commonly associated with acute renal failure, glomerulonephritis or diabetes.  Original Report Authenticated By: Dereck Ligas, M.D.   Chest Portable 1 View  12/31/2011  *RADIOLOGY REPORT*  Clinical Data: Mid chest pain and shortness of breath.  PORTABLE CHEST - 1 VIEW  Comparison: Chest radiograph performed 11/30/2011  Findings: There is vascular congestion, with increased interstitial markings, compatible with mild interstitial edema.  No pleural effusion or pneumothorax is seen.  The cardiomediastinal silhouette is enlarged, mildly more prominent than on the prior study.  No acute osseous abnormalities are identified.  IMPRESSION: Vascular congestion  and mildly worsening cardiomegaly, with increased interstitial markings, compatible with mild interstitial edema.  Given clinical concern and recent viral infection, this could reflect viral cardiomyopathy.  Original Report Authenticated By: Santa Lighter, M.D.   EKG: Sinus tachycardia, rate 119. Left atrial enlargement. Left ventricular hypertrophy. Flipped T waves laterally.  Echocardiogram Left ventricle: The cavity size was mild tomoderatelydilated. There was mild concentric hypertrophy and borderline eccentric remodelling. Severe global hypokinesis, LVEF <20%. Doppler parameters are consistent with restrictive left ventricular relaxation (grade3 diastolic dysfunction). The E/A ratio is >2. The E/e' ratio is >25, suggesting markedly elevated LV filling pressure. - Aortic valve: Sclerosis without stenosis. Trivial central regurgitation. - Mitral valve: Dilated annulus. Moderate regurgitation. - Left atrium: Severlely dilated. - Right atrium: The atrium was mildly dilated. - Tricuspid valve: Mildly thickened leaflets. Trivial TR. No good TR envelope to measure RVSP. - Systemic veins: The IVC is small (<1.2 cm) and collapses spontaneously, suggesting a low RA pressure of 2 mmHG and intravascular dehydration. - Pericardium, extracardiac: There was no pericardial effusion  Full Code   Hospital Course: See H&P for complete admission details. The patient is a pleasant 31 year old black male with a previous history of nonischemic cardiomyopathy. He previously been followed by Dr. Debara Pickett, [facility] heart and vascular. His ejection fraction previously was noted to be less than 20%. He had a cardiac catheterization that showed no heart disease. Patient presented with shortness of breath. He reports that his echocardiogram was improved about 6 months after he was diagnosed, therefore he was taken off his medications. In the emergency room, he was hypertensive with a blood pressure 159/115.  Heart rate 109. JVD was present. Clear lung sounds. No noted edema. he had an elevated troponin, chest x-ray consistent with CHF and worsening cardiomegaly.   The patient was admitted to the hospitalist service, started on diuretics. [facility] were called to consult, but they declined, as patient has been released from their care. [facility] kindly consulted. Repeat echocardiogram showed ejection fraction of less than 20%. Mild to moderate left ventricular dilatation. Grade 3 diastolic dysfunction as well. Aortic sclerosis without stenosis. Moderate mitral valve regurgitation. Severely dilated left atrium, mildly dilated right atrium. Carvedilol, low-dose ACE inhibitor, spironolactone were added. By the time of discharge, patient's symptoms were much improved. BNP was lower. Creatinine was stable. Electrolyte disturbances have been corrected. Cardiology recommends followup with the heart failure clinic and I concur. I've spoken with the nurse practitioner who recommends followup this Friday. We tried to set up an appointment but the scheduler was gone. Patient will call on Monday for an appointment. Note was written for work. Total time on the day of discharge greater than 30 minutes.  Discharge Exam:  Blood pressure 102/64, pulse 94, temperature 98.6 F (37 C), temperature source Oral, resp. rate 20, height 5\' 10"  (1.778 m), weight 87.68 kg (193 lb 4.8 oz), SpO2 96.00%.  Unchanged from 01/01/2012  Signed: Doree Barthel L 01/02/2012, 9:29 AM

## 2012-01-02 NOTE — Progress Notes (Signed)
Nurse tech transported pt to main entrance of hospital for discharge.  Pt stable.

## 2012-01-02 NOTE — Plan of Care (Signed)
Problem: Phase I Progression Outcomes Goal: Anginal pain relieved Outcome: Progressing No complaints at this time.

## 2012-01-02 NOTE — Progress Notes (Signed)
Pt provided with discharge instructions and prescriptions.   Pt verbalizes understanding of d/c instructions.  Nurse provided pt with Heart Failure information packet.  IV removed.  Pt tolerated well.

## 2012-01-02 NOTE — Progress Notes (Signed)
[place] [address] [place] [id number]  January 02, 2012  Patient: [name] [name]  Date of Birth: 01/24/1981  Date of Visit: 12/31/2011    To Whom It May Concern:  [name] [name] was seen and treated in our emergency department on 12/31/2011. NINO STRUBE  may return to work on 01/03/12. Will need Friday 01/08/12 follow up visit.  Sincerely,     Elige Shouse L. Conley Canal, M.D.

## 2012-01-08 ENCOUNTER — Ambulatory Visit (HOSPITAL_COMMUNITY)
Admission: RE | Admit: 2012-01-08 | Discharge: 2012-01-08 | Disposition: A | Discharge status: Home / Self Care | Payer: Self-pay | Source: Ambulatory Visit | Attending: Internal Medicine | Admitting: Internal Medicine

## 2012-01-08 VITALS — BP 108/76 | HR 87 | Wt 200.5 lb

## 2012-01-08 DIAGNOSIS — I5043 Acute on chronic combined systolic (congestive) and diastolic (congestive) heart failure: Secondary | ICD-10-CM | POA: Insufficient documentation

## 2012-01-08 DIAGNOSIS — I5042 Chronic combined systolic (congestive) and diastolic (congestive) heart failure: Secondary | ICD-10-CM

## 2012-01-08 LAB — BASIC METABOLIC PANEL
Chloride: 105 mEq/L (ref 96–112)
GFR calc Af Amer: 60 mL/min — ABNORMAL LOW (ref >90)
Potassium: 3.9 mEq/L (ref 3.5–5.1)

## 2012-01-08 NOTE — Patient Instructions (Addendum)
Follow up in 2 weeks  Do the following things EVERYDAY: 1) Weigh yourself in the morning before breakfast. Write it down and keep it in a log. 2) Take your medicines as prescribed 3) Eat low salt foods--Limit salt (sodium) to 2000 mg per day.  4) Stay as active as you can everyday 5) Limit all fluids for the day to less than 2 liters 

## 2012-01-08 NOTE — Progress Notes (Signed)
Patient ID: [name] [name], male   DOB: 06/06/1981, 31 y.o.   MRN: [id number] Referring MD: Dr Conley Canal PCP: None  HPI: [name] is AA with history of NICM that was initially diagnosed 2012 and thought to be related to a virus, renal insufficiency (baseline creatinine 1.6-1.7), and hypertension.  He was followed by [facility] (Dr Debara Pickett.  Discharged from Dr Debara Pickett last fall after EF recovered. He was instructed to stop taking carvedilol, spironolactone, and lasix after he completed his current prescription in 2012.   Approximately 2 months ago, he developed dyspnea.  Evaluated at [facility] May 2013 and he was thought to have pneumonia and he was started on inhaler, antibiotics, and steroids. He continued to have progressive dyspnea, orthopnea,  and chest tightness and returned to [facility] ED.  Admitted to [facility] [id number] for acute systolic heart failure. 12/31/11 ProBNP 3777. HIV nonreactive. Thyroid panel normal. Renal ultrasound was negative for obstruction.  Diuresed 12 pounds.   D/C 01/02/12 form [facility] after being treated for aucte heart failure. Started on carvedilol, spirnolactone, lasix, and lisinopril. Discharge weight 193 pounds.   He is referred to the HF clinic by Dr Conley Canal. Denies SOB/Orthopnea/PND/CP/dizzness. Compliant with medications. Lives with his parents. Works full time at [employer].  Follows low salt diet and limit fluid intake. Denies alcohol or recreational drug use.   Review of Systems:     Cardiac Review of Systems: {Y] = yes [ ]  = no  Chest Pain [    ]  Resting SOB [   ] Exertional SOB  [  ]  Orthopnea [  ]   Pedal Edema [   ]    Palpitations [  ] Syncope  [  ]   Presyncope [   ]  General Review of Systems: [Y] = yes [  ]=no Constitional: recent weight change [  ]; anorexia [  ]; fatigue [  ]; nausea [  ]; night sweats [  ]; fever [  ]; or chills [  ];                                                                                                                                           Dental: poor dentition[  ]; Last Dentist visit:   Eye : blurred vision [  ]; diplopia [   ]; vision changes [  ];  Amaurosis fugax[  ]; Resp: cough [  ];  wheezing[  ];  hemoptysis[  ]; shortness of breath[  ]; paroxysmal nocturnal dyspnea[  ]; dyspnea on exertion[  ]; or orthopnea[  ];  GI:  gallstones[  ], vomiting[  ];  dysphagia[  ]; melena[  ];  hematochezia [  ]; heartburn[  ];   Hx of  Colonoscopy[  ]; GU: kidney stones [  ]; hematuria[  ];   dysuria [  ];  nocturia[  ];  history of     obstruction [  ];                 Skin: rash, swelling[  ];, hair loss[  ];  peripheral edema[  ];  or itching[  ]; Musculosketetal: myalgias[  ];  joint swelling[  ];  joint erythema[  ];  joint pain[  ];  back pain[  ];  Heme/Lymph: bruising[  ];  bleeding[  ];  anemia[  ];  Neuro: TIA[  ];  headaches[  ];  stroke[  ];  vertigo[  ];  seizures[  ];   paresthesias[  ];  difficulty walking[  ];  Psych:depression[  ]; anxiety[  ];  Endocrine: diabetes[  ];  thyroid dysfunction[  ];  Immunizations: Flu [  ]; Pneumococcal[  ];  Other:    Past Medical History  Diagnosis Date  . Pneumonia   . Cardiomyopathy     Echo 11/23/2010=>EF <20%, mod MR  . Acute on chronic systolic CHF (congestive heart failure) 12/31/2011  . CKD (chronic kidney disease)     Cr 1.6-1.7 May/2012  . LVH (left ventricular hypertrophy) 12/31/2011  . Hypertension   . Anginal pain     Current Outpatient Prescriptions  Medication Sig Dispense Refill  . carvedilol (COREG) 12.5 MG tablet Take 1 tablet (12.5 mg total) by mouth 2 (two) times daily with a meal.  60 tablet  0  . furosemide (LASIX) 40 MG tablet Take 1 tablet (40 mg total) by mouth daily.  30 tablet  0  . lisinopril (PRINIVIL,ZESTRIL) 2.5 MG tablet Take 1 tablet (2.5 mg total) by mouth daily.  30 tablet  0  . spironolactone (ALDACTONE) 25 MG tablet Take 1 tablet (25 mg total) by mouth daily.  30 tablet  0  . albuterol (PROVENTIL) (2.5  MG/3ML) 0.083% nebulizer solution Take 2.5 mg by nebulization every 6 (six) hours as needed. For shortness of breath         No Known Allergies  History   Social History  . Marital Status: Single    Spouse Name: N/A    Number of Children: N/A  . Years of Education: N/A   Occupational History  . Not on file.   Social History Main Topics  . Smoking status: Never Smoker   . Smokeless tobacco: Not on file  . Alcohol Use: No  . Drug Use: No  . Sexually Active: Yes    Birth Control/ Protection: Condom   Other Topics Concern  . Not on file   Social History Narrative   Occupation: just started sanitation job cleaning    Family History  Problem Relation Age of Onset  . Breast cancer Mother   . Stroke Father     PHYSICAL EXAM: Filed Vitals:   01/08/12 1043  BP: 108/76  Pulse: 87   General:  Well appearing. No respiratory difficulty HEENT: normal Neck: supple. no JVD. Carotids 2+ bilat; no bruits. No lymphadenopathy or thryomegaly appreciated. Cor: PMI nondisplaced. Regular rate & rhythm. No rubs, gallops or murmurs. Lungs: clear Abdomen: soft, nontender, nondistended. No hepatosplenomegaly. No bruits or masses. Good bowel sounds. Extremities: no cyanosis, clubbing, rash, edema Neuro: alert & oriented x 3, cranial nerves grossly intact. moves all 4 extremities w/o difficulty. Affect pleasant.    No results found for this or any previous visit (from the past 24 hour(s)). No results found.   ASSESSMENT & PLAN:

## 2012-01-08 NOTE — Assessment & Plan Note (Addendum)
Discussed purpose of HF clinic. Reviewed his medical record. Volume status stable. Continue current regimen. Lengthy discussion regarding medication compliance, daily weights, and limiting fluid intake to less than 2 liters per day. Will need 3 months of optimum medical management followed by repeat ECHO. If EF remains less than 35% will need ICD.  Check BMET. Follow up in 2 weeks. Anticipate increasing beta blocker at next office visit.

## 2012-01-22 ENCOUNTER — Ambulatory Visit (HOSPITAL_COMMUNITY): Admission: RE | Admit: 2012-01-22 | Payer: Self-pay | Source: Ambulatory Visit

## 2012-01-29 ENCOUNTER — Ambulatory Visit (HOSPITAL_COMMUNITY)
Admission: RE | Admit: 2012-01-29 | Discharge: 2012-01-29 | Disposition: A | Discharge status: Home / Self Care | Payer: Self-pay | Source: Ambulatory Visit | Attending: Internal Medicine | Admitting: Internal Medicine

## 2012-01-29 VITALS — BP 124/82 | HR 75 | Wt 193.5 lb

## 2012-01-29 DIAGNOSIS — I5042 Chronic combined systolic (congestive) and diastolic (congestive) heart failure: Secondary | ICD-10-CM | POA: Insufficient documentation

## 2012-01-29 DIAGNOSIS — N189 Chronic kidney disease, unspecified: Secondary | ICD-10-CM | POA: Insufficient documentation

## 2012-01-29 LAB — BASIC METABOLIC PANEL
CO2: 23 mEq/L (ref 19–32)
Calcium: 9.3 mg/dL (ref 8.4–10.5)
Chloride: 102 mEq/L (ref 96–112)
Sodium: 138 mEq/L (ref 135–145)

## 2012-01-29 MED ORDER — SPIRONOLACTONE 25 MG PO TABS: 25.0000 mg | ORAL_TABLET | Freq: Every day | ORAL | Status: DC

## 2012-01-29 MED ORDER — FUROSEMIDE 40 MG PO TABS: 40.0000 mg | ORAL_TABLET | Freq: Every day | ORAL | Status: DC

## 2012-01-29 MED ORDER — CARVEDILOL 12.5 MG PO TABS: 18.7500 mg | ORAL_TABLET | Freq: Two times a day (BID) | ORAL | Status: DC

## 2012-01-29 MED ORDER — LISINOPRIL 2.5 MG PO TABS: 2.5000 mg | ORAL_TABLET | Freq: Two times a day (BID) | ORAL | Status: DC

## 2012-01-29 NOTE — Assessment & Plan Note (Signed)
Check BMET today 

## 2012-01-29 NOTE — Patient Instructions (Addendum)
Increase lisinopril 2.5 mg twice daily.  Try to increase carvedilol 1.5 tabs (18.75 mg) at night for 5 nights, if tolerating and not dizzy/fatigued/SOB then you may increase the morning dose to 1.5 tabs as well.  Call if there are any questions.  If weight falls below 187 pounds hold lasix for that day.  Labs today.   Follow up 2-3 weeks.

## 2012-01-29 NOTE — Assessment & Plan Note (Addendum)
NYHA II.  Euvolemic on exam today.  Will titrate lisinopril 2.5 mg BID.  Have also asked the patient to titrate carvedilol over the next week if tolerating.  Will increase nighttime dose of carvedilol 18.75 mg for 5 days, if he tolerates titration will have him increase morning dose as well.  He will call if he has questions.  Have discussed the role of sliding scale lasix and if weight falls below 187 pounds he will hold lasix that day.  Reinforced daily weights and low sodium diet.  He voiced understanding.  BMET today and follow up 2 weeks for further medication titration.

## 2012-01-29 NOTE — Progress Notes (Signed)
Referring MD: Dr Conley Canal PCP: None  HPI: [name] [name] is 31 y.o. AA with history of NICM that was initially diagnosed 2012 and thought to be related to a virus, renal insufficiency (baseline creatinine 1.6-1.7), and hypertension.  He was followed by [facility] (Dr Debara Pickett.  Discharged from Dr Debara Pickett last fall after EF recovered. He was instructed to stop taking carvedilol, spironolactone, and lasix after he completed his current prescription in 2012.   Approximately 2 months ago, he developed dyspnea.  Evaluated at [facility] May 2013 and he was thought to have pneumonia and he was started on inhaler, antibiotics, and steroids. He continued to have progressive dyspnea, orthopnea,  and chest tightness and returned to [facility] ED.  Admitted to [facility] [id number] for acute systolic heart failure. 12/31/11 ProBNP 3777. HIV nonreactive. Thyroid panel normal. Renal ultrasound was negative for obstruction.  Diuresed 12 pounds.   D/C 01/02/12 form [facility] after being treated for aucte heart failure. Started on carvedilol, spirnolactone, lasix, and lisinopril. Discharge weight 193 pounds.   He returns for routine follow up today.  He continues to do well.  He is working full time at the [employer].  He is complaint with his meds.  He denies SOB/orthopnea/PND.  No chest pain.  No dizziness/syncope.  He is watching his sodium intake.  He says some days he has to drink more than others because he works outside but weight is stable 189-191 pounds.    Review of Systems: All pertinent positives and negatives as in HPI, otherwise negative.   Past Medical History  Diagnosis Date  . Pneumonia   . Cardiomyopathy     Echo 11/23/2010=>EF <20%, mod [name]  . Acute on chronic systolic CHF (congestive heart failure) 12/31/2011  . CKD (chronic kidney disease)     Cr 1.6-1.7 May/2012  . LVH (left ventricular hypertrophy) 12/31/2011  . Hypertension   . Anginal pain     Current Outpatient Prescriptions  Medication  Sig Dispense Refill  . carvedilol (COREG) 12.5 MG tablet Take 1 tablet (12.5 mg total) by mouth 2 (two) times daily with a meal.  60 tablet  0  . furosemide (LASIX) 40 MG tablet Take 1 tablet (40 mg total) by mouth daily.  30 tablet  0  . lisinopril (PRINIVIL,ZESTRIL) 2.5 MG tablet Take 1 tablet (2.5 mg total) by mouth daily.  30 tablet  0  . spironolactone (ALDACTONE) 25 MG tablet Take 1 tablet (25 mg total) by mouth daily.  30 tablet  0  . albuterol (PROVENTIL) (2.5 MG/3ML) 0.083% nebulizer solution Take 2.5 mg by nebulization every 6 (six) hours as needed. For shortness of breath        PHYSICAL EXAM: Filed Vitals:   01/29/12 1127  BP: 124/82  Pulse: 75  Weight: 193 lb 8 oz (87.771 kg)  SpO2: 99%    General:  Well appearing. No respiratory difficulty HEENT: normal Neck: supple. no JVD. Carotids 2+ bilat; no bruits. No lymphadenopathy or thryomegaly appreciated. Cor: PMI nondisplaced. Regular rate & rhythm. No rubs, gallops or murmurs. Lungs: clear Abdomen: soft, nontender, nondistended. No hepatosplenomegaly. No bruits or masses. Good bowel sounds. Extremities: no cyanosis, clubbing, rash, edema Neuro: alert & oriented x 3, cranial nerves grossly intact. moves all 4 extremities w/o difficulty. Affect pleasant.    ASSESSMENT & PLAN:

## 2012-02-18 ENCOUNTER — Encounter (HOSPITAL_COMMUNITY): Payer: Self-pay

## 2012-02-25 ENCOUNTER — Ambulatory Visit (HOSPITAL_COMMUNITY)
Admission: RE | Admit: 2012-02-25 | Discharge: 2012-02-25 | Disposition: A | Discharge status: Home / Self Care | Payer: Self-pay | Source: Ambulatory Visit | Attending: Internal Medicine | Admitting: Internal Medicine

## 2012-02-25 ENCOUNTER — Encounter (HOSPITAL_COMMUNITY): Payer: Self-pay

## 2012-02-25 VITALS — BP 180/118 | HR 71 | Ht 70.0 in | Wt 193.0 lb

## 2012-02-25 DIAGNOSIS — I10 Essential (primary) hypertension: Secondary | ICD-10-CM | POA: Insufficient documentation

## 2012-02-25 DIAGNOSIS — I5042 Chronic combined systolic (congestive) and diastolic (congestive) heart failure: Secondary | ICD-10-CM | POA: Insufficient documentation

## 2012-02-25 NOTE — Progress Notes (Signed)
Referring MD: Dr Conley Canal PCP: None  HPI:  [name] [name] is 31 y.o. AA with history of NICM that was initially diagnosed 2012 and thought to be related to a virus, renal insufficiency (baseline creatinine 1.6-1.7), and hypertension.  He was followed by [facility] (Dr Debara Pickett.  Discharged from Dr Debara Pickett last fall after EF recovered. He was instructed to stop taking carvedilol, spironolactone, and lasix after he completed his current prescription in 2012. Cath at that time with normal cors.   Developed recurrent HF in May 2013.  Evaluated at [facility] ED May 2013 and he was thought to have pneumonia and he was started on inhaler, antibiotics, and steroids. He continued to have progressive dyspnea, orthopnea,  and chest tightness and returned to [facility] ED.  Admitted to [facility] [id number] for acute systolic heart failure. 12/31/11 ProBNP 3777. HIV nonreactive. Thyroid panel normal. Renal ultrasound was negative for obstruction.  Diuresed 12 pounds.   D/C 01/02/12 form [facility] after being treated for aucte heart failure. Echo with EF < 20%. Mod MR, restrictive filling pattern Started on carvedilol, spirnolactone, lasix, and lisinopril. Discharge weight 193 pounds.   Was seen in HF clinic last month doing well. He returns for routine follow up today.  He says he continues to feel well though he ran out of all of his meds about 3 weeks ago. He is working full time at the [employer].  He denies SOB/orthopnea/PND.  No chest pain.  No dizziness/syncope.  He is watching his sodium intake.  He is weighing at home and weight and it has been stable 190-193.    Review of Systems: All pertinent positives and negatives as in HPI, otherwise negative.   Past Medical History  Diagnosis Date  . Pneumonia   . Cardiomyopathy     Echo 11/23/2010=>EF <20%, mod MR  . Acute on chronic systolic CHF (congestive heart failure) 12/31/2011  . CKD (chronic kidney disease)     Cr 1.6-1.7 May/2012  . LVH (left ventricular  hypertrophy) 12/31/2011  . Hypertension   . Anginal pain     Current Outpatient Prescriptions  Medication Sig Dispense Refill  . albuterol (PROVENTIL) (2.5 MG/3ML) 0.083% nebulizer solution Take 2.5 mg by nebulization every 6 (six) hours as needed. For shortness of breath      . carvedilol (COREG) 12.5 MG tablet Take 1.5 tablets (18.75 mg total) by mouth 2 (two) times daily with a meal.  90 tablet  2  . furosemide (LASIX) 40 MG tablet Take 1 tablet (40 mg total) by mouth daily.  30 tablet  3  . lisinopril (PRINIVIL,ZESTRIL) 2.5 MG tablet Take 1 tablet (2.5 mg total) by mouth 2 (two) times daily.  60 tablet  3  . spironolactone (ALDACTONE) 25 MG tablet Take 1 tablet (25 mg total) by mouth daily.  30 tablet  6    PHYSICAL EXAM: Filed Vitals:   02/25/12 1202  BP: 180/110  Pulse: 71  Height: 5\' 10"  (1.778 m)  Weight: 193 lb (87.544 kg)  SpO2: 98%    General:  Well appearing. No respiratory difficulty HEENT: normal Neck: supple. no JVD. Carotids 2+ bilat; no bruits. No lymphadenopathy or thryomegaly appreciated. Cor: PMI nondisplaced. Regular rate & rhythm. No rubs, gallops 2/6 MR. Lungs: clear Abdomen: soft, nontender, nondistended. No hepatosplenomegaly. No bruits or masses. Good bowel sounds. Extremities: no cyanosis, clubbing, rash, edema Neuro: alert & oriented x 3, cranial nerves grossly intact. moves all 4 extremities w/o difficulty. Affect pleasant.    ASSESSMENT &  PLAN:

## 2012-02-25 NOTE — Assessment & Plan Note (Signed)
See HF section discussion.

## 2012-02-25 NOTE — Assessment & Plan Note (Signed)
His HF seems fairly well controlled despite running out of his meds 3 weeks ago. I had a long talk with him about the need for medication compliance and the high risk of morbidity/mortality in this setting. In looking back through his records, I assumed that his LV dysfunction was related to longstanding ,poorly-controlled HTN but he says his high BP only started 1 year ago. He has had a cath which showed normal cors. Labs show normal iron, normal TSH, negative HIV. During a previous admit his troponin was elevated in the 4 range and raises the possibility of myocarditis. We discussed possible MRI but he doesn't have insurance and would like to defer at this point.  I have given him several tablets of amlodipine we had in the office to get his BP down and stressed to him the need to pick up his meds today. Also reinforced need for daily weights and reviewed use of sliding scale diuretics.

## 2012-02-25 NOTE — Patient Instructions (Addendum)
Your physician recommends that you schedule a follow-up appointment in: 3 weeks  

## 2012-03-17 ENCOUNTER — Ambulatory Visit (HOSPITAL_COMMUNITY): Payer: Self-pay | Attending: Internal Medicine

## 2012-05-03 ENCOUNTER — Emergency Department (HOSPITAL_COMMUNITY)
Admission: EM | Admit: 2012-05-03 | Discharge: 2012-05-03 | Disposition: A | Discharge status: Home / Self Care | Payer: Self-pay | Attending: Emergency Medicine | Admitting: Emergency Medicine

## 2012-05-03 ENCOUNTER — Encounter (HOSPITAL_COMMUNITY): Payer: Self-pay

## 2012-05-03 DIAGNOSIS — I10 Essential (primary) hypertension: Secondary | ICD-10-CM | POA: Insufficient documentation

## 2012-05-03 DIAGNOSIS — Z8679 Personal history of other diseases of the circulatory system: Secondary | ICD-10-CM | POA: Insufficient documentation

## 2012-05-03 DIAGNOSIS — R51 Headache: Secondary | ICD-10-CM | POA: Insufficient documentation

## 2012-05-03 DIAGNOSIS — R1110 Vomiting, unspecified: Secondary | ICD-10-CM | POA: Insufficient documentation

## 2012-05-03 DIAGNOSIS — I428 Other cardiomyopathies: Secondary | ICD-10-CM | POA: Insufficient documentation

## 2012-05-03 DIAGNOSIS — Z8701 Personal history of pneumonia (recurrent): Secondary | ICD-10-CM | POA: Insufficient documentation

## 2012-05-03 LAB — CBC WITH DIFFERENTIAL/PLATELET
Basophils Absolute: 0 10*3/uL (ref 0.0–0.1)
Basophils Relative: 1 % (ref 0–1)
HCT: 40.8 % (ref 39.0–52.0)
Lymphocytes Relative: 48 % — ABNORMAL HIGH (ref 12–46)
MCHC: 34.3 g/dL (ref 30.0–36.0)
Monocytes Absolute: 0.3 10*3/uL (ref 0.1–1.0)
Neutro Abs: 2.5 10*3/uL (ref 1.7–7.7)
Platelets: 194 10*3/uL (ref 150–400)
RDW: 14.9 % (ref 11.5–15.5)
WBC: 5.8 10*3/uL (ref 4.0–10.5)

## 2012-05-03 LAB — BASIC METABOLIC PANEL
Calcium: 8.8 mg/dL (ref 8.4–10.5)
Chloride: 102 mEq/L (ref 96–112)
Creatinine, Ser: 1.33 mg/dL (ref 0.50–1.35)
GFR calc Af Amer: 81 mL/min — ABNORMAL LOW (ref >90)
Sodium: 138 mEq/L (ref 135–145)

## 2012-05-03 MED ORDER — SODIUM CHLORIDE 0.9 % IV SOLN
Freq: Once | INTRAVENOUS | Status: AC
  Administered 2012-05-03: 20:00:00 via INTRAVENOUS

## 2012-05-03 MED ORDER — PROMETHAZINE HCL 25 MG PO TABS: 25.0000 mg | ORAL_TABLET | Freq: Four times a day (QID) | ORAL | Status: DC | PRN

## 2012-05-03 NOTE — ED Notes (Signed)
Pt states that HA and nausea are gone

## 2012-05-03 NOTE — ED Provider Notes (Signed)
History  This chart was scribed for Maudry Diego, MD by Jenne Campus. This patient was seen in room APA06/APA06 and the patient's care was started at 8:21PM.  CSN: [id number]  Arrival date & time 05/03/12  1932   First MD Initiated Contact with Patient 05/03/12 2021      Chief Complaint  Patient presents with  . Emesis  . Headache    Patient is a 31 y.o. male presenting with headaches. The history is provided by the patient. No language interpreter was used.  Headache  This is a new problem. The current episode started yesterday. The problem occurs constantly. The problem has been gradually improving. The quality of the pain is described as dull. The pain is mild. The pain does not radiate.     [name] [name] is a 31 y.o. male who presents to the Emergency Department complaining of approximately 24 hours of gradual onset, gradually improving, constant generalized HA described as dull with associated nausea and one episode of non-bloody emesis. He denies nausea currently and states that he just ate dinner without difficulty. He denies having any modifying factors and rates his pain a 4 or 5 out of 10 at its worst and 2 or 3 out of 10 at its best. He reports taking Ibuprofen with mild improvement in his symptoms. He denies having any sick contacts with similar symptoms. He denies fevers, congestion, cough, abdominal pain and urinary symptoms as associated symptoms. He has a h/o HTN and reports that he was just put back on lisinopril and aldactone. He has a h/o CHF and denies smoking and alcohol use.                     PCP is Dr. Moshe Cipro  Past Medical History  Diagnosis Date  . Pneumonia   . Cardiomyopathy     Echo 11/23/2010=>EF <20%, mod MR  . Acute on chronic systolic CHF (congestive heart failure) 12/31/2011  . CKD (chronic kidney disease) Denies at bedside    Cr 1.6-1.7 May/2012  . LVH (left ventricular hypertrophy) 12/31/2011  . Hypertension   . Anginal pain     Past  Surgical History  Procedure Date  . Surgery scrotal / testicular     testicular torsion  . Cardiac catheterization 11/25/2010    no cad    Family History  Problem Relation Age of Onset  . Breast cancer Mother   . Stroke Father     History  Substance Use Topics  . Smoking status: Never Smoker   . Smokeless tobacco: Not on file  . Alcohol Use: No      Review of Systems  Constitutional: Negative for fatigue.  HENT: Negative for congestion, sinus pressure and ear discharge.   Eyes: Negative for discharge.  Respiratory: Negative for cough.   Cardiovascular: Negative for chest pain.  Gastrointestinal: Negative for abdominal pain and diarrhea.  Genitourinary: Negative for frequency and hematuria.  Musculoskeletal: Negative for back pain.  Skin: Negative for rash.  Neurological: Positive for headaches. Negative for seizures.  Hematological: Negative.   Psychiatric/Behavioral: Negative for hallucinations.    Allergies  Review of patient's allergies indicates no known allergies.  Home Medications   Current Outpatient Rx  Name Route Sig Dispense Refill  . ALBUTEROL SULFATE (2.5 MG/3ML) 0.083% IN NEBU Nebulization Take 2.5 mg by nebulization every 6 (six) hours as needed. For shortness of breath    . CARVEDILOL 12.5 MG PO TABS Oral Take 1.5 tablets (18.75 mg total)  by mouth 2 (two) times daily with a meal. 90 tablet 2  . FUROSEMIDE 40 MG PO TABS Oral Take 1 tablet (40 mg total) by mouth daily. 30 tablet 3  . LISINOPRIL 2.5 MG PO TABS Oral Take 1 tablet (2.5 mg total) by mouth 2 (two) times daily. 60 tablet 3  . SPIRONOLACTONE 25 MG PO TABS Oral Take 1 tablet (25 mg total) by mouth daily. 30 tablet 6    Triage Vitals: BP 179/112  Pulse 78  Temp 98.1 F (36.7 C) (Oral)  Resp 16  Ht 5\' 10"  (1.778 m)  Wt 200 lb (90.719 kg)  BMI 28.70 kg/m2  SpO2 100%  Physical Exam  Nursing note and vitals reviewed. Constitutional: He is oriented to person, place, and time. He appears  well-developed and well-nourished.  HENT:  Head: Normocephalic and atraumatic.  Eyes: Conjunctivae normal and EOM are normal. No scleral icterus.  Neck: Neck supple. No thyromegaly present.  Cardiovascular: Normal rate and regular rhythm.  Exam reveals no gallop and no friction rub.   No murmur heard. Pulmonary/Chest: Effort normal and breath sounds normal. No stridor. He has no wheezes. He has no rales. He exhibits no tenderness.  Abdominal: He exhibits no distension. There is no tenderness. There is no rebound.  Musculoskeletal: Normal range of motion. He exhibits no edema.  Lymphadenopathy:    He has no cervical adenopathy.  Neurological: He is alert and oriented to person, place, and time. Coordination normal.  Skin: Skin is warm and dry. No rash noted. No erythema.  Psychiatric: He has a normal mood and affect. His behavior is normal.    ED Course  Procedures (including critical care time)  DIAGNOSTIC STUDIES: Oxygen Saturation is 100% on room air, normal by my interpretation.    COORDINATION OF CARE: 8:25 PM-Discussed treatment plan which includes blood work and IV fluids with pt at bedside and pt agreed to plan.  8:30PM-Ordered 0.9% sodium chloride infusion  Labs Reviewed  CBC WITH DIFFERENTIAL - Abnormal; Notable for the following:    Lymphocytes Relative 48 (*)     All other components within normal limits  BASIC METABOLIC PANEL - Abnormal; Notable for the following:    Potassium 3.4 (*)     GFR calc non Af Amer 70 (*)     GFR calc Af Amer 81 (*)     All other components within normal limits   No results found.   No diagnosis found.    MDM         Maudry Diego, MD 05/03/12 2208

## 2012-05-03 NOTE — ED Notes (Signed)
MD at bedside. 

## 2012-05-03 NOTE — ED Notes (Signed)
Not feeling good, started vomiting today and having a headache per pt.

## 2012-05-03 NOTE — ED Notes (Signed)
Pt with HA since last night and generally not feeling well, N/V earlier today, states that he ate at local fast food and has been able to keep down presently, denies any fever or chills

## 2013-02-10 ENCOUNTER — Other Ambulatory Visit (HOSPITAL_COMMUNITY): Payer: Self-pay | Admitting: Physician Assistant

## 2013-02-13 ENCOUNTER — Encounter (HOSPITAL_COMMUNITY): Payer: Self-pay | Admitting: *Deleted

## 2013-02-13 ENCOUNTER — Other Ambulatory Visit: Payer: Self-pay | Admitting: *Deleted

## 2013-02-14 MED ORDER — FUROSEMIDE 40 MG PO TABS: 40.0000 mg | ORAL_TABLET | Freq: Every day | ORAL | Status: DC

## 2013-03-18 ENCOUNTER — Emergency Department (HOSPITAL_COMMUNITY)
Admission: EM | Admit: 2013-03-18 | Discharge: 2013-03-18 | Disposition: A | Discharge status: Home / Self Care | Payer: BC Managed Care – PPO | Attending: Emergency Medicine | Admitting: Emergency Medicine

## 2013-03-18 ENCOUNTER — Encounter (HOSPITAL_COMMUNITY): Payer: Self-pay

## 2013-03-18 DIAGNOSIS — N189 Chronic kidney disease, unspecified: Secondary | ICD-10-CM | POA: Insufficient documentation

## 2013-03-18 DIAGNOSIS — Z79899 Other long term (current) drug therapy: Secondary | ICD-10-CM | POA: Insufficient documentation

## 2013-03-18 DIAGNOSIS — I129 Hypertensive chronic kidney disease with stage 1 through stage 4 chronic kidney disease, or unspecified chronic kidney disease: Secondary | ICD-10-CM | POA: Insufficient documentation

## 2013-03-18 DIAGNOSIS — I10 Essential (primary) hypertension: Secondary | ICD-10-CM

## 2013-03-18 DIAGNOSIS — Z8701 Personal history of pneumonia (recurrent): Secondary | ICD-10-CM | POA: Insufficient documentation

## 2013-03-18 DIAGNOSIS — R0789 Other chest pain: Secondary | ICD-10-CM | POA: Insufficient documentation

## 2013-03-18 DIAGNOSIS — R002 Palpitations: Secondary | ICD-10-CM | POA: Insufficient documentation

## 2013-03-18 DIAGNOSIS — R0602 Shortness of breath: Secondary | ICD-10-CM | POA: Insufficient documentation

## 2013-03-18 DIAGNOSIS — Z9861 Coronary angioplasty status: Secondary | ICD-10-CM | POA: Insufficient documentation

## 2013-03-18 DIAGNOSIS — I5023 Acute on chronic systolic (congestive) heart failure: Secondary | ICD-10-CM | POA: Insufficient documentation

## 2013-03-18 LAB — CBC WITH DIFFERENTIAL/PLATELET
Basophils Absolute: 0 10*3/uL (ref 0.0–0.1)
HCT: 42.7 % (ref 39.0–52.0)
Lymphocytes Relative: 45 % (ref 12–46)
Lymphs Abs: 3.3 10*3/uL (ref 0.7–4.0)
Neutro Abs: 3.4 10*3/uL (ref 1.7–7.7)
Platelets: 190 10*3/uL (ref 150–400)
RBC: 5.27 MIL/uL (ref 4.22–5.81)
RDW: 13.1 % (ref 11.5–15.5)
WBC: 7.2 10*3/uL (ref 4.0–10.5)

## 2013-03-18 LAB — COMPREHENSIVE METABOLIC PANEL
ALT: 16 U/L (ref 0–53)
AST: 22 U/L (ref 0–37)
Alkaline Phosphatase: 75 U/L (ref 39–117)
CO2: 24 mEq/L (ref 19–32)
Chloride: 101 mEq/L (ref 96–112)
GFR calc non Af Amer: 53 mL/min — ABNORMAL LOW (ref >90)
Sodium: 136 mEq/L (ref 135–145)
Total Bilirubin: 0.3 mg/dL (ref 0.3–1.2)

## 2013-03-18 MED ORDER — CARVEDILOL 12.5 MG PO TABS: 12.5000 mg | ORAL_TABLET | Freq: Two times a day (BID) | ORAL | Status: DC

## 2013-03-18 MED ORDER — PROMETHAZINE HCL 25 MG PO TABS: 25.0000 mg | ORAL_TABLET | Freq: Four times a day (QID) | ORAL | Status: DC | PRN

## 2013-03-18 MED ORDER — FUROSEMIDE 40 MG PO TABS: 40.0000 mg | ORAL_TABLET | Freq: Every day | ORAL | Status: DC

## 2013-03-18 MED ORDER — SPIRONOLACTONE 25 MG PO TABS: 25.0000 mg | ORAL_TABLET | Freq: Every day | ORAL | Status: DC

## 2013-03-18 MED ORDER — LISINOPRIL 2.5 MG PO TABS: 2.5000 mg | ORAL_TABLET | Freq: Two times a day (BID) | ORAL | Status: DC

## 2013-03-18 NOTE — ED Notes (Signed)
Pt presents with chest discomfort x 2 months and SOB. Chest discomfort is described as a tightness with worsening SOB today. Pt also reports dyspnea with activity of any type. Lying down makes chest tightness increase per pt . Denies pain at this time. Denies N/V and prior dx of GERD. No distress noted at this time. Pt is alert and oriented x 4. Skin pink, warm and dry to touch.

## 2013-03-18 NOTE — ED Provider Notes (Signed)
CSN: [id number]     Arrival date & time 03/18/13  2017 History  This chart was scribed for Veryl Speak, MD by Jenne Campus, ED Scribe. This patient was seen in room APA12/APA12 and the patient's care was started at 8:40 PM   Chief Complaint  Patient presents with  . Chest Pain    The history is provided by the patient. No language interpreter was used.    HPI Comments: [name] [name] is a 32 y.o. male who presents to the Emergency Department complaining of Mid sternal CP described as pressure with associated SOB and palpitations that have been felt intermittently over the past 2 months. He states that the symptoms have been getting worse since the onset. He reports one prior episode 2 years ago with more severe symptoms. He states that he was diagnosed with PNA at the time with secondary diagnoses of cardiomegaly and pleural effusion that improved with treatment. He had a cardiac catheterization afterwards that showed that the cardiomegaly had resolved and the heart was working appropriately. He states that he was also diagnosed with HTN at the time but has been off of all his daily medications (Lasix, Coreg, lisinopril and aldactone) for the past few months due to not having a PCP to refill his prescriptions. He admits that he has not been keeping track of his daily BPs. He denies having a h/o cardiac problems other than the aforementioned condition. He reports a HA from stress but denies any visual problems.   Past Medical History  Diagnosis Date  . Pneumonia   . Cardiomyopathy     Echo 11/23/2010=>EF <20%, mod MR  . Acute on chronic systolic CHF (congestive heart failure) 12/31/2011  . CKD (chronic kidney disease)     Cr 1.6-1.7 May/2012  . LVH (left ventricular hypertrophy) 12/31/2011  . Hypertension   . Anginal pain    Past Surgical History  Procedure Laterality Date  . Surgery scrotal / testicular      testicular torsion  . Cardiac catheterization  11/25/2010    no cad    Family History  Problem Relation Age of Onset  . Breast cancer Mother   . Stroke Father    History  Substance Use Topics  . Smoking status: Never Smoker   . Smokeless tobacco: Not on file  . Alcohol Use: No    Review of Systems  Respiratory: Positive for shortness of breath. Negative for cough.   Cardiovascular: Positive for chest pain and palpitations. Negative for leg swelling.  All other systems reviewed and are negative.    Allergies  Review of patient's allergies indicates no known allergies.  Home Medications   Current Outpatient Rx  Name  Route  Sig  Dispense  Refill  . furosemide (LASIX) 40 MG tablet   Oral   Take 1 tablet (40 mg total) by mouth daily.   30 tablet   0     NO ADDITIONAL REFILLS WILL GIVEN WITHOUT AN APPOIN ...   . carvedilol (COREG) 12.5 MG tablet      TAKE ONE & ONE-HALF TABLETS BY MOUTH TWICE DAILY WITH A MEAL   90 tablet   0     NO ADDITIONAL REFILLS WILL BE GIVEN WITHOUT APPOIN ...   . EXPIRED: lisinopril (PRINIVIL,ZESTRIL) 2.5 MG tablet   Oral   Take 1 tablet (2.5 mg total) by mouth 2 (two) times daily.   60 tablet   3   . promethazine (PHENERGAN) 25 MG tablet   Oral  Take 1 tablet (25 mg total) by mouth every 6 (six) hours as needed for nausea.   15 tablet   0   . EXPIRED: spironolactone (ALDACTONE) 25 MG tablet   Oral   Take 1 tablet (25 mg total) by mouth daily.   30 tablet   6    Triage Vitals: BP 196/137  Pulse 106  Temp(Src) 98.5 F (36.9 C) (Oral)  Resp 20  Ht 5\' 10"  (1.778 m)  Wt 215 lb (97.523 kg)  BMI 30.85 kg/m2  SpO2 100%  Physical Exam  Nursing note and vitals reviewed. Constitutional: He is oriented to person, place, and time. He appears well-developed and well-nourished. No distress.  HENT:  Head: Normocephalic and atraumatic.  Eyes: Conjunctivae and EOM are normal.  Neck: Normal range of motion. Neck supple. No tracheal deviation present.  Cardiovascular: Normal rate, regular rhythm and  normal heart sounds.   No murmur heard. Pulmonary/Chest: Effort normal and breath sounds normal. No respiratory distress. He has no wheezes. He has no rales.  Abdominal: Soft. Bowel sounds are normal. There is no tenderness.  Musculoskeletal: Normal range of motion. He exhibits no edema.  Neurological: He is alert and oriented to person, place, and time. No cranial nerve deficit.  No ataxia on finger to nose, 5/5 strength throughout   Skin: Skin is warm and dry.  Psychiatric: He has a normal mood and affect. His behavior is normal.    ED Course  Procedures (including critical care time)  DIAGNOSTIC STUDIES: Oxygen Saturation is 100% on room air, normal by my interpretation.    COORDINATION OF CARE: 8:44 PM-Discussed treatment plan which includes CBC panel, CMP and troponin with pt at bedside and pt agreed to plan. Advised pt that he needs to be started back on his daily medications.  Labs Review Labs Reviewed  COMPREHENSIVE METABOLIC PANEL - Abnormal; Notable for the following:    Potassium 3.3 (*)    Glucose, Bld 110 (*)    BUN 27 (*)    Creatinine, Ser 1.68 (*)    GFR calc non Af Amer 53 (*)    GFR calc Af Amer 61 (*)    All other components within normal limits  CBC WITH DIFFERENTIAL  TROPONIN I   Imaging Review No results found.   Date: 03/18/2013  Rate: 101  Rhythm: sinus tachycardia  QRS Axis: normal  Intervals: normal  ST/T Wave abnormalities: normal  Conduction Disutrbances:none  Narrative Interpretation:   Old EKG Reviewed: unchanged    MDM  No diagnosis found. Patient presents here with complaints of pressure in his chest and elevated blood pressure. He states his been going on for the past 2 weeks. He ran out of his blood pressure medication couple months back and hasn't had it since then. He has not had a primary doctor to followup with and this is why he has not been taking his medications. The workup today reveals an unchanged EKG and negative troponin.  A mild increase in his creatinine to 1.7 the electrolytes are otherwise unremarkable. His blood pressure is elevated here in the ED but has improved while he has been here. He will be discharged to home I will start him on his new medications recommend he obtain a primary doctor to prescribe this for him in the future. I doubt there is any cardiac etiology and there is no evidence of end organ damage.      I personally performed the services described in this documentation, which was scribed  in my presence. The recorded information has been reviewed and is accurate.      Veryl Speak, MD 03/18/13 2203

## 2013-03-18 NOTE — ED Notes (Signed)
Have been having chest pressure, Started over 2 weeks ago per pt. The first time it happened they said I had pneumonia and I let it go for too long and it caused other problems like high blood pressure. Patient states that he has not had his blood pressure medications for over 2 months per pt.

## 2013-06-12 ENCOUNTER — Encounter (HOSPITAL_COMMUNITY): Payer: Self-pay | Admitting: Emergency Medicine

## 2013-06-12 DIAGNOSIS — Z9119 Patient's noncompliance with other medical treatment and regimen: Secondary | ICD-10-CM

## 2013-06-12 DIAGNOSIS — E669 Obesity, unspecified: Secondary | ICD-10-CM | POA: Diagnosis present

## 2013-06-12 DIAGNOSIS — I509 Heart failure, unspecified: Secondary | ICD-10-CM | POA: Diagnosis present

## 2013-06-12 DIAGNOSIS — Z91199 Patient's noncompliance with other medical treatment and regimen due to unspecified reason: Secondary | ICD-10-CM

## 2013-06-12 DIAGNOSIS — Z823 Family history of stroke: Secondary | ICD-10-CM

## 2013-06-12 DIAGNOSIS — I2589 Other forms of chronic ischemic heart disease: Secondary | ICD-10-CM | POA: Diagnosis present

## 2013-06-12 DIAGNOSIS — N183 Chronic kidney disease, stage 3 unspecified: Secondary | ICD-10-CM | POA: Diagnosis present

## 2013-06-12 DIAGNOSIS — I129 Hypertensive chronic kidney disease with stage 1 through stage 4 chronic kidney disease, or unspecified chronic kidney disease: Principal | ICD-10-CM | POA: Diagnosis present

## 2013-06-12 DIAGNOSIS — I5043 Acute on chronic combined systolic (congestive) and diastolic (congestive) heart failure: Secondary | ICD-10-CM | POA: Diagnosis present

## 2013-06-12 DIAGNOSIS — Z6831 Body mass index (BMI) 31.0-31.9, adult: Secondary | ICD-10-CM

## 2013-06-12 DIAGNOSIS — Z8701 Personal history of pneumonia (recurrent): Secondary | ICD-10-CM

## 2013-06-12 DIAGNOSIS — Z803 Family history of malignant neoplasm of breast: Secondary | ICD-10-CM

## 2013-06-12 NOTE — ED Notes (Signed)
Pt c/o chest pain with sob x one week.

## 2013-06-13 ENCOUNTER — Encounter (HOSPITAL_COMMUNITY): Payer: Self-pay | Admitting: Internal Medicine

## 2013-06-13 ENCOUNTER — Emergency Department (HOSPITAL_COMMUNITY): Payer: BC Managed Care – PPO

## 2013-06-13 ENCOUNTER — Inpatient Hospital Stay (HOSPITAL_COMMUNITY)
Admission: EM | Admit: 2013-06-13 | Discharge: 2013-06-14 | DRG: 682 | Disposition: A | Discharge status: Home / Self Care | Payer: BC Managed Care – PPO | Attending: Internal Medicine | Admitting: Internal Medicine

## 2013-06-13 DIAGNOSIS — I509 Heart failure, unspecified: Secondary | ICD-10-CM

## 2013-06-13 DIAGNOSIS — R079 Chest pain, unspecified: Secondary | ICD-10-CM | POA: Diagnosis present

## 2013-06-13 DIAGNOSIS — E876 Hypokalemia: Secondary | ICD-10-CM

## 2013-06-13 DIAGNOSIS — I160 Hypertensive urgency: Secondary | ICD-10-CM | POA: Diagnosis present

## 2013-06-13 DIAGNOSIS — R778 Other specified abnormalities of plasma proteins: Secondary | ICD-10-CM

## 2013-06-13 DIAGNOSIS — I428 Other cardiomyopathies: Secondary | ICD-10-CM

## 2013-06-13 DIAGNOSIS — N289 Disorder of kidney and ureter, unspecified: Secondary | ICD-10-CM

## 2013-06-13 DIAGNOSIS — I10 Essential (primary) hypertension: Secondary | ICD-10-CM

## 2013-06-13 DIAGNOSIS — N179 Acute kidney failure, unspecified: Secondary | ICD-10-CM

## 2013-06-13 DIAGNOSIS — N183 Chronic kidney disease, stage 3 unspecified: Secondary | ICD-10-CM

## 2013-06-13 DIAGNOSIS — N189 Chronic kidney disease, unspecified: Secondary | ICD-10-CM

## 2013-06-13 DIAGNOSIS — I059 Rheumatic mitral valve disease, unspecified: Secondary | ICD-10-CM

## 2013-06-13 DIAGNOSIS — Z9114 Patient's other noncompliance with medication regimen: Secondary | ICD-10-CM

## 2013-06-13 DIAGNOSIS — I5042 Chronic combined systolic (congestive) and diastolic (congestive) heart failure: Secondary | ICD-10-CM

## 2013-06-13 DIAGNOSIS — I5043 Acute on chronic combined systolic (congestive) and diastolic (congestive) heart failure: Secondary | ICD-10-CM | POA: Diagnosis present

## 2013-06-13 HISTORY — DX: Chronic systolic (congestive) heart failure: I50.22

## 2013-06-13 HISTORY — DX: Chronic kidney disease, stage 2 (mild): N18.2

## 2013-06-13 HISTORY — DX: Other cardiomyopathies: I42.8

## 2013-06-13 HISTORY — DX: Nonrheumatic mitral (valve) insufficiency: I34.0

## 2013-06-13 HISTORY — DX: Essential (primary) hypertension: I10

## 2013-06-13 HISTORY — DX: Personal history of pneumonia (recurrent): Z87.01

## 2013-06-13 HISTORY — DX: Patient's noncompliance with other medical treatment and regimen due to unspecified reason: Z91.199

## 2013-06-13 HISTORY — DX: Patient's noncompliance with other medical treatment and regimen: Z91.19

## 2013-06-13 LAB — RAPID URINE DRUG SCREEN, HOSP PERFORMED
Amphetamines: NOT DETECTED
Benzodiazepines: NOT DETECTED
Opiates: NOT DETECTED
Tetrahydrocannabinol: NOT DETECTED

## 2013-06-13 LAB — CBC WITH DIFFERENTIAL/PLATELET
Eosinophils Absolute: 0.2 10*3/uL (ref 0.0–0.7)
Eosinophils Relative: 3 % (ref 0–5)
Lymphs Abs: 3.6 10*3/uL (ref 0.7–4.0)
MCH: 27.7 pg (ref 26.0–34.0)
MCHC: 34.4 g/dL (ref 30.0–36.0)
MCV: 80.6 fL (ref 78.0–100.0)
Platelets: 198 10*3/uL (ref 150–400)
RDW: 13.7 % (ref 11.5–15.5)

## 2013-06-13 LAB — BASIC METABOLIC PANEL
Calcium: 9.3 mg/dL (ref 8.4–10.5)
GFR calc non Af Amer: 60 mL/min — ABNORMAL LOW (ref >90)
Glucose, Bld: 98 mg/dL (ref 70–99)
Sodium: 136 mEq/L (ref 135–145)

## 2013-06-13 LAB — URINALYSIS, ROUTINE W REFLEX MICROSCOPIC
Glucose, UA: NEGATIVE mg/dL
Hgb urine dipstick: NEGATIVE
Ketones, ur: NEGATIVE mg/dL
Leukocytes, UA: NEGATIVE
Protein, ur: NEGATIVE mg/dL
Specific Gravity, Urine: 1.015 (ref 1.005–1.030)
Urobilinogen, UA: 0.2 mg/dL (ref 0.0–1.0)

## 2013-06-13 LAB — TROPONIN I
Troponin I: <0.3 ng/mL (ref <0.30)
Troponin I: <0.3 ng/mL (ref <0.30)
Troponin I: <0.3 ng/mL (ref <0.30)
Troponin I: <0.3 ng/mL (ref <0.30)

## 2013-06-13 MED ORDER — HYDRALAZINE HCL 20 MG/ML IJ SOLN
10.0000 mg | INTRAMUSCULAR | Status: DC | PRN
  Administered 2013-06-13: 10 mg via INTRAVENOUS
  Filled 2013-06-13: qty 1

## 2013-06-13 MED ORDER — FUROSEMIDE 10 MG/ML IJ SOLN
40.0000 mg | Freq: Once | INTRAMUSCULAR | Status: AC
  Administered 2013-06-13: 40 mg via INTRAVENOUS
  Filled 2013-06-13: qty 4

## 2013-06-13 MED ORDER — SPIRONOLACTONE 25 MG PO TABS
25.0000 mg | ORAL_TABLET | Freq: Every day | ORAL | Status: DC
  Administered 2013-06-13 – 2013-06-14 (×2): 25 mg via ORAL
  Filled 2013-06-13 (×2): qty 1

## 2013-06-13 MED ORDER — LABETALOL HCL 5 MG/ML IV SOLN
20.0000 mg | Freq: Once | INTRAVENOUS | Status: AC
  Administered 2013-06-13: 20 mg via INTRAVENOUS

## 2013-06-13 MED ORDER — FUROSEMIDE 40 MG PO TABS
40.0000 mg | ORAL_TABLET | Freq: Every day | ORAL | Status: DC
  Administered 2013-06-13 – 2013-06-14 (×2): 40 mg via ORAL
  Filled 2013-06-13 (×2): qty 1

## 2013-06-13 MED ORDER — LABETALOL HCL 5 MG/ML IV SOLN
INTRAVENOUS | Status: AC
  Filled 2013-06-13: qty 4

## 2013-06-13 MED ORDER — SODIUM CHLORIDE 0.9 % IJ SOLN
3.0000 mL | Freq: Two times a day (BID) | INTRAMUSCULAR | Status: DC
  Administered 2013-06-13 – 2013-06-14 (×2): 3 mL via INTRAVENOUS

## 2013-06-13 MED ORDER — ACETAMINOPHEN 325 MG PO TABS
650.0000 mg | ORAL_TABLET | ORAL | Status: DC | PRN
  Administered 2013-06-13: 650 mg via ORAL
  Filled 2013-06-13: qty 2

## 2013-06-13 MED ORDER — ASPIRIN EC 81 MG PO TBEC
81.0000 mg | DELAYED_RELEASE_TABLET | Freq: Every day | ORAL | Status: DC
  Administered 2013-06-13 – 2013-06-14 (×2): 81 mg via ORAL
  Filled 2013-06-13 (×3): qty 1

## 2013-06-13 MED ORDER — CARVEDILOL 12.5 MG PO TABS
12.5000 mg | ORAL_TABLET | Freq: Two times a day (BID) | ORAL | Status: DC
  Administered 2013-06-13 – 2013-06-14 (×3): 12.5 mg via ORAL
  Filled 2013-06-13 (×3): qty 1

## 2013-06-13 MED ORDER — NITROGLYCERIN 2 % TD OINT
1.0000 [in_us] | TOPICAL_OINTMENT | Freq: Four times a day (QID) | TRANSDERMAL | Status: DC
  Administered 2013-06-13 – 2013-06-14 (×6): 1 [in_us] via TOPICAL
  Filled 2013-06-13 (×5): qty 1

## 2013-06-13 MED ORDER — ONDANSETRON HCL 4 MG/2ML IJ SOLN: 4.0000 mg | INTRAMUSCULAR | Status: DC | PRN

## 2013-06-13 MED ORDER — LISINOPRIL 5 MG PO TABS
2.5000 mg | ORAL_TABLET | Freq: Two times a day (BID) | ORAL | Status: DC
  Administered 2013-06-13 (×2): 2.5 mg via ORAL
  Filled 2013-06-13 (×2): qty 1

## 2013-06-13 MED ORDER — ASPIRIN 81 MG PO CHEW
324.0000 mg | CHEWABLE_TABLET | Freq: Once | ORAL | Status: AC
  Administered 2013-06-13: 324 mg via ORAL
  Filled 2013-06-13: qty 4

## 2013-06-13 MED ORDER — TRAZODONE HCL 50 MG PO TABS: 50.0000 mg | ORAL_TABLET | Freq: Every evening | ORAL | Status: DC | PRN

## 2013-06-13 MED ORDER — LIVING BETTER WITH HEART FAILURE BOOK
Freq: Once | Status: AC
  Administered 2013-06-13: 12:00:00
  Filled 2013-06-13: qty 1

## 2013-06-13 MED ORDER — ENOXAPARIN SODIUM 40 MG/0.4ML ~~LOC~~ SOLN
40.0000 mg | SUBCUTANEOUS | Status: DC
  Administered 2013-06-13 – 2013-06-14 (×2): 40 mg via SUBCUTANEOUS
  Filled 2013-06-13 (×2): qty 0.4

## 2013-06-13 NOTE — Care Management Note (Addendum)
    Page 1 of 1   06/14/2013     10:30:31 AM   CARE MANAGEMENT NOTE 06/14/2013  Patient:  [name] [name], [name] [name]   Account Number:  [id number]  Date Initiated:  06/13/2013  Documentation initiated by:  Theophilus Kinds  Subjective/Objective Assessment:   Pt admitted from home with HTN urgency. Pt lives with family and will return home at discharge. Pt is independent with ADL's. Pt has appt for PCP at [facility] on 06/16/13.     Action/Plan:   No CM needs noted.   Anticipated DC Date:  06/14/2013   Anticipated DC Plan:  [place]  CM consult      Choice offered to / List presented to:             Status of service:  Completed, signed off Medicare Important Message given?   (If response is "NO", the following Medicare IM given date fields will be blank) Date Medicare IM given:   Date Additional Medicare IM given:    Discharge Disposition:  HOME/SELF CARE  Per UR Regulation:    If discussed at Long Length of Stay Meetings, dates discussed:    Comments:  06/14/13 [place], RN BSN CM pt discharged home today. No CM needs noted.  06/13/13 [place], RN BSN CM

## 2013-06-13 NOTE — Progress Notes (Signed)
Patient wants iv removed. Explained importance of keeping iv access. Pt said he really wants it out anyways. IV removed as pt is now refusing iv meds.

## 2013-06-13 NOTE — ED Provider Notes (Signed)
CSN: [id number]     Arrival date & time 06/12/13  2323 History   First MD Initiated Contact with Patient 06/13/13 0038     Chief Complaint  Patient presents with  . Shortness of Breath  . Chest Pain   (Consider location/radiation/quality/duration/timing/severity/associated sxs/prior Treatment) Patient is a 32 y.o. male presenting with shortness of breath and chest pain. The history is provided by the patient.  Shortness of Breath Associated symptoms: chest pain   Chest Pain Associated symptoms: shortness of breath   He has been having exertional chest pressure and dyspnea for the last week. He states it is getting worse but it is very vague on the amount of exertion as required to bring symptoms on. There is associated diaphoresis but no nausea or vomiting. He states that the pressure is 2/10 at its worst. He did wake up for one occasion with difficulty breathing. He denies orthopnea. Is he has not treated herself with anything. Symptoms stop soon as he stops exerting himself. He does have history of nonischemic cardiomyopathy.  Past Medical History  Diagnosis Date  . Pneumonia   . Cardiomyopathy     Echo 11/23/2010=>EF <20%, mod MR  . Acute on chronic systolic CHF (congestive heart failure) 12/31/2011  . CKD (chronic kidney disease)     Cr 1.6-1.7 May/2012  . LVH (left ventricular hypertrophy) 12/31/2011  . Hypertension   . Anginal pain    Past Surgical History  Procedure Laterality Date  . Surgery scrotal / testicular      testicular torsion  . Cardiac catheterization  11/25/2010    no cad   Family History  Problem Relation Age of Onset  . Breast cancer Mother   . Stroke Father    History  Substance Use Topics  . Smoking status: Never Smoker   . Smokeless tobacco: Not on file  . Alcohol Use: No    Review of Systems  Respiratory: Positive for shortness of breath.   Cardiovascular: Positive for chest pain.  All other systems reviewed and are negative.    Allergies   Review of patient's allergies indicates no known allergies.  Home Medications   Current Outpatient Rx  Name  Route  Sig  Dispense  Refill  . carvedilol (COREG) 12.5 MG tablet   Oral   Take 1 tablet (12.5 mg total) by mouth 2 (two) times daily with a meal.   60 tablet   0     NO ADDITIONAL REFILLS WILL BE GIVEN WITHOUT APPOIN ...   . furosemide (LASIX) 40 MG tablet   Oral   Take 1 tablet (40 mg total) by mouth daily.   30 tablet   0     NO ADDITIONAL REFILLS WILL GIVEN WITHOUT AN APPOIN ...   . lisinopril (PRINIVIL,ZESTRIL) 2.5 MG tablet   Oral   Take 1 tablet (2.5 mg total) by mouth 2 (two) times daily.   60 tablet   0   . promethazine (PHENERGAN) 25 MG tablet   Oral   Take 1 tablet (25 mg total) by mouth every 6 (six) hours as needed for nausea.   15 tablet   0   . spironolactone (ALDACTONE) 25 MG tablet   Oral   Take 1 tablet (25 mg total) by mouth daily.   30 tablet   0    BP 187/126  Pulse 94  Temp(Src) 97.6 F (36.4 C) (Oral)  Resp 20  Ht 5\' 10"  (1.778 m)  Wt 220 lb (99.791 kg)  BMI  31.57 kg/m2  SpO2 100% Physical Exam  Nursing note and vitals reviewed.  32 year old male, resting comfortably and in no acute distress. Vital signs are significant for hypertension with blood pressure 187/126. Oxygen saturation is 100%, which is normal. Head is normocephalic and atraumatic. PERRLA, EOMI. Oropharynx is clear. Neck is nontender and supple without adenopathy or JVD. Back is nontender and there is no CVA tenderness. Lungs are clear without rales, wheezes, or rhonchi. Chest is nontender. Heart has regular rate and rhythm without murmur. Abdomen is soft, flat, nontender without masses or hepatosplenomegaly and peristalsis is normoactive. Extremities have no cyanosis or edema, full range of motion is present. Skin is warm and dry without rash. Neurologic: Mental status is normal, cranial nerves are intact, there are no motor or sensory deficits.  ED Course   Procedures (including critical care time) Labs Review Results for orders placed during the hospital encounter of 06/13/13  CBC WITH DIFFERENTIAL      Result Value Range   WBC 7.9  4.0 - 10.5 K/uL   RBC 5.56  4.22 - 5.81 MIL/uL   Hemoglobin 15.4  13.0 - 17.0 g/dL   HCT 44.8  39.0 - 52.0 %   MCV 80.6  78.0 - 100.0 fL   MCH 27.7  26.0 - 34.0 pg   MCHC 34.4  30.0 - 36.0 g/dL   RDW 13.7  11.5 - 15.5 %   Platelets 198  150 - 400 K/uL   Neutrophils Relative % 46  43 - 77 %   Neutro Abs 3.7  1.7 - 7.7 K/uL   Lymphocytes Relative 46  12 - 46 %   Lymphs Abs 3.6  0.7 - 4.0 K/uL   Monocytes Relative 4  3 - 12 %   Monocytes Absolute 0.3  0.1 - 1.0 K/uL   Eosinophils Relative 3  0 - 5 %   Eosinophils Absolute 0.2  0.0 - 0.7 K/uL   Basophils Relative 1  0 - 1 %   Basophils Absolute 0.0  0.0 - 0.1 K/uL  BASIC METABOLIC PANEL      Result Value Range   Sodium 136  135 - 145 mEq/L   Potassium 4.1  3.5 - 5.1 mEq/L   Chloride 101  96 - 112 mEq/L   CO2 24  19 - 32 mEq/L   Glucose, Bld 98  70 - 99 mg/dL   BUN 17  6 - 23 mg/dL   Creatinine, Ser 1.50 (*) 0.50 - 1.35 mg/dL   Calcium 9.3  8.4 - 10.5 mg/dL   GFR calc non Af Amer 60 (*) >90 mL/min   GFR calc Af Amer 70 (*) >90 mL/min  PRO B NATRIURETIC PEPTIDE      Result Value Range   Pro B Natriuretic peptide (BNP) 1381.0 (*) 0 - 125 pg/mL  TROPONIN I      Result Value Range   Troponin I <0.30  <0.30 ng/mL   Imaging Review Dg Chest 2 View  06/13/2013   CLINICAL DATA:  Chest pain and shortness of breath. History of cardiomyopathy. CHF. Hypertension. Left ventricular hypertrophy.  EXAM: CHEST  2 VIEW  COMPARISON:  12/31/11  FINDINGS: Midline trachea. Moderate cardiomegaly. Mediastinal contours otherwise within normal limits. No pleural effusion or pneumothorax. No congestive failure. Clear lungs.  IMPRESSION: Cardiomegaly without congestive failure.   Electronically Signed   By: Abigail Miyamoto M.D.   On: 06/13/2013 01:57    EKG Interpretation     Date/Time:  Monday  June 12 2013 23:30:29 EST Ventricular Rate:  93 PR Interval:  168 QRS Duration: 106 QT Interval:  382 QTC Calculation: 474 R Axis:   -26 Text Interpretation:  Normal sinus rhythm Possible Left atrial enlargement Left ventricular hypertrophy T wave abnormality, consider lateral ischemia Prolonged QT Abnormal ECG When compared with ECG of 18-Mar-2013 20:21, No significant change was found Repolarization abnormality secondary to ventricular hypertrophy Confirmed by [facility]  MD, Develle Sievers ([id number]) on 06/13/2013 12:39:53 AM           CRITICAL CARE Performed by: [id number] Total critical care time: 40 minutes Critical care time was exclusive of separately billable procedures and treating other patients. Critical care was necessary to treat or prevent imminent or life-threatening deterioration. Critical care was time spent personally by me on the following activities: development of treatment plan with patient and/or surrogate as well as nursing, discussions with consultants, evaluation of patient's response to treatment, examination of patient, obtaining history from patient or surrogate, ordering and performing treatments and interventions, ordering and review of laboratory studies, ordering and review of radiographic studies, pulse oximetry and re-evaluation of patient's condition.  MDM   1. Hypertensive urgency   2. CHF (congestive heart failure)   3. Renal insufficiency    Chest pain and dyspnea but no pattern that would be worrisome for unstable angina. However, old records are reviewed and he had a cardiac catheterization in May of 2012 showing no coronary artery disease. He does have an established diagnosis of nonischemic cardiomyopathy. Blood pressure is exceedingly high and this may represent a hypertensive urgency. He does not check his blood pressure at home.  Workup is significant for renal insufficiency which is unchanged from baseline, and mildly elevated BNP  which is unchanged from baseline. He was given labetalol for his blood pressure. At this point, hypertensive urgency seems to be his major underlying problem. Case is discussed with Dr. Megan Salon of [facility] who agrees to admit the patient.  Delora Fuel, MD [phone]

## 2013-06-13 NOTE — Progress Notes (Signed)
[facility] PROGRESS NOTE  ARSON LUNDWALL [id number] DOB: Sep 10, 1980 DOA: 06/13/2013 PCP: No PCP Per Patient  Summary: 32 year old man with history of known ischemic cardiomyopathy, last cardiac catheterization 11/2010 revealing normal coronary arteries, most recent LVEF less than 123456, grade 3 diastolic dysfunction echocardiogram 12/2011 who presented to the emergency department with chest pain and shortness of breath. He was admitted for hypertensive urgency, acute on chronic systolic and diastolic congestive heart failure and chest pressure. He rapidly improved with diuresis. Cardiology consultation is pending and medications have been restarted. Would anticipate discharge in the next 24 hours based on final cardiology recommendations.  Assessment/Plan: 1. Hypertensive urgency: Secondary to noncompliance, off all medications for one month. 2. Chest pain: Resolved. Suspect secondary to hypertensive urgency and heart failure. Troponins negative thus far. Cardiology consultation, but no history of coronary disease. 3. Uncontrolled hypertension: Plan as above. Likely secondary to noncompliance. 4. Nonischemic cardiomyopathy: Resume Coreg, Lasix, lisinopril, Aldactone. 5. Chronic systolic, diastolic congestive heart failure: Appears compensated at this point. 6. Chronic kidney disease stage III: Appears to be at baseline. 7. Noncompliance: He has followup already arranged with [facility] later this week.   Initiate heart failure medications, antihypertensives--Coreg, lisinopril, Lasix.  Fluid restriction, heart healthy diet discussed with patient  Nutrition consultation for diet education  Cardiology consultation  2-D echocardiogram  Can likely transfer out of the unit later today.  Pending studies:   TSH  Troponins  Code Status: full code DVT prophylaxis: Lovenox Family Communication: Discussed with girlfriend present Disposition Plan: home when improved  Murray Hodgkins, MD  [facility]  Pager [phone] If 7PM-7AM, please contact night-coverage at [email or web address], password [facility] 06/13/2013, 7:59 AM  LOS: 0 days    Consultants:  Cardiology   Procedures:  2-D echocardiogram:  Antibiotics:    HPI/Subjective: Feels better today. Chest pressure has resolved. Breathing better. Voided quite a bit. No complaints today. He reports running out of his medications approximately one month ago. He has had increasing shortness of breath over the last 2 weeks. He reports he has a medical appointment with [facility] later this week to establish care. He has not seen a cardiologist in at least one year.  Objective: Filed Vitals:   06/13/13 0600 06/13/13 0630 06/13/13 0643 06/13/13 0700  BP: 168/120 165/128 165/128 167/109  Pulse:      Temp:      TempSrc:      Resp: 24 15  22   Height:      Weight:      SpO2:        Intake/Output Summary (Last 24 hours) at 06/13/13 0759 Last data filed at 06/13/13 0749  Gross per 24 hour  Intake      0 ml  Output   1500 ml  Net  -1500 ml     Filed Weights   06/12/13 2327 06/13/13 0400  Weight: 99.791 kg (220 lb) 98 kg (216 lb 0.8 oz)    Exam:   Afebrile. Respiratory rate 20s. Heart rate 80s to 90s. Systolic blood pressure 123456, diastolic blood pressure 99991111.  Eyes: Pupils equal, round, react to light. Normal lids, irises.  General: Appears calm and comfortable. Speech fluent and clear.  ENT: Hearing grossly normal. Lips and tongue appear unremarkable.  Cardiovascular: Regular rate and rhythm. No murmur, rub gallop. No lower extremity edema.  Respiratory: Clear to auscultation bilaterally. No wheezes, rales or rhonchi. Normal respiratory effort.  Abdomen: Soft, nontender, nondistended.    Data Reviewed:  Basic metabolic panel  unremarkable, creatinine appears to be at baseline 1.5.  Troponins negative thus far.  BNP 1381  CBC unremarkable  Chest x-ray cardiomegaly without  failure.  EKG on admission: Sinus rhythm, LVH, left atrial enlargement, lateral T wave inversion consider ischemia. Hence. 2 previously 03/18/2013, lateral T wave inversion is old and likely reflects repolarization abnormality.  Scheduled Meds: . aspirin EC  81 mg Oral Daily  . carvedilol  12.5 mg Oral BID WC  . enoxaparin (LOVENOX) injection  40 mg Subcutaneous Q24H  . furosemide  40 mg Oral Daily  . lisinopril  2.5 mg Oral BID  . nitroGLYCERIN  1 inch Topical Q6H  . sodium chloride  3 mL Intravenous Q12H  . spironolactone  25 mg Oral Daily   Continuous Infusions:   Principal Problem:   Hypertensive urgency Active Problems:   Acute on chronic combined systolic and diastolic CHF (congestive heart failure)   Chest pain   HTN (hypertension)   CKD (chronic kidney disease) stage 3, GFR 30-59 ml/min   Acute on chronic combined systolic and diastolic heart failure   Nonischemic cardiomyopathy   Time spent 25 minutes

## 2013-06-13 NOTE — Progress Notes (Signed)
Pt politely refused foley

## 2013-06-13 NOTE — Consult Note (Signed)
CARDIOLOGY CONSULT NOTE   Patient ID: [name] [name] MRN: [id number] DOB/AGE: March 01, 1981 32 y.o.  Admit Date: 06/13/2013 Referring Physician: PTH  Primary Physician: No PCP Per Patient Consulting Cardiologist: Rozann Lesches MD Primary Cardiologist: [facility] (CHF clinic)  Reason for Consultation: Hypertensive urgency and CHF  Clinical Summary [name] [name] is a 32 y.o.male with known history of NICM and hypertension who has been followed by CHF clinic in the past (last seen August 2013). He is now admitted with hypertensive urgency, BP of 187/126, secondary to medical noncompliance. He states that he has been out of his medications since the end of October. He had no insurance at that time, nor PCP. He also admits to eating salty foods. He tends to diminish his use of salt. He states he felt chest pressure and had some mild shortness of breath leading him to come to ER. He was treated with IV labetalol and and ASA in ER. Placed back on lisinopril, coreg and spironolactone as he was to be taking at home. BP has normalized. Creatinine 1.50. Most recent CHF visit was in August  of 2013.    No Known Allergies  Medications Scheduled Medications: . aspirin EC  81 mg Oral Daily  . carvedilol  12.5 mg Oral BID WC  . enoxaparin (LOVENOX) injection  40 mg Subcutaneous Q24H  . furosemide  40 mg Oral Daily  . lisinopril  2.5 mg Oral BID  . nitroGLYCERIN  1 inch Topical Q6H  . sodium chloride  3 mL Intravenous Q12H  . spironolactone  25 mg Oral Daily    PRN Medications: acetaminophen, hydrALAZINE, ondansetron (ZOFRAN) IV, traZODone   Past Medical History  Diagnosis Date  . History of pneumonia   . Nonischemic cardiomyopathy     Normal coronaries May 2012, LVEF < 20%  . Chronic systolic heart failure [id number]  . CKD (chronic kidney disease) stage 2, GFR 60-89 ml/min   . Essential hypertension, benign   . Noncompliance   . Mitral regurgitation     Moderate    Past  Surgical History  Procedure Laterality Date  . Surgery scrotal / testicular      Testicular torsion    Family History  Problem Relation Age of Onset  . Breast cancer Mother   . Stroke Father     Social History [name] [name] reports that he has never smoked. He does not have any smokeless tobacco history on file. [name] [name] reports that he does not drink alcohol.   Review of Systems Otherwise reviewed and negative except as outlined.   Physical Examination Blood pressure 148/99, pulse 76, temperature 97.9 F (36.6 C), temperature source Oral, resp. rate 20, height 5\' 10"  (1.778 m), weight 216 lb 0.8 oz (98 kg), SpO2 96.00%.  Intake/Output Summary (Last 24 hours) at 06/13/13 1021 Last data filed at 06/13/13 1018  Gross per 24 hour  Intake      0 ml  Output   2100 ml  Net  -2100 ml    Telemetry: Normal sinus rhythm.  Appears comfortable. HEENT: Conjunctiva and lids normal, oropharynx clear. Neck: Supple, elevated JVP, no carotid bruits, no thyromegaly. Lungs: Clear to auscultation, nonlabored breathing at rest. Cardiac: Regular rate and rhythm, soft S3, soft apical systolic murmur, no pericardial rub. Abdomen: Soft, nontender, bowel sounds present, no guarding or rebound. Extremities: No pitting edema, distal pulses 2+. Skin: Warm and dry. Musculoskeletal: No kyphosis. Neuropsychiatric: Alert and oriented x3, affect grossly appropriate.  Prior Cardiac Testing/Procedures  Echocardiogram: 12/31/2011  Left ventricle: The cavity size was mild tomoderatelydilated. There was mild concentric hypertrophy and borderline eccentric remodelling. Severe global hypokinesis, LVEF <20%. Doppler parameters are consistent with restrictive left ventricular relaxation (grade3 diastolic dysfunction). The E/A ratio is >2. The E/e' ratio is >25, suggesting markedly elevated LV filling pressure. - Aortic valve: Sclerosis without stenosis. Trivial central regurgitation. - Mitral valve:  Dilated annulus. Moderate regurgitation. - Left atrium: Severlely dilated. - Right atrium: The atrium was mildly dilated. - Tricuspid valve: Mildly thickened leaflets. Trivial TR. No good TR envelope to measure RVSP. - Systemic veins: The IVC is small (<1.2 cm) and collapses spontaneously, suggesting a low RA pressure of 2 mmHG and intravascular dehydration. - Pericardium, extracardiac: There was no pericardial effusion.  Cardiac Cath (R &L) 11/2010 IMPRESSION:  1. No evidence of coronary artery disease.  2. LVEDP = 18 mmHg.  3. Mildly reduced cardiac output and index with good pulmonary artery  saturation and elevated PCWP.  Lab Results  Basic Metabolic Panel:  Recent Labs Lab 06/13/13 0052  NA 136  K 4.1  CL 101  CO2 24  GLUCOSE 98  BUN 17  CREATININE 1.50*  CALCIUM 9.3    CBC:  Recent Labs Lab 06/13/13 0052  WBC 7.9  NEUTROABS 3.7  HGB 15.4  HCT 44.8  MCV 80.6  PLT 198    Cardiac Enzymes:  Recent Labs Lab 06/13/13 0052 06/13/13 0635  TROPONINI <0.30 <0.30    Radiology: Dg Chest 2 View  06/13/2013   CLINICAL DATA:  Chest pain and shortness of breath. History of cardiomyopathy. CHF. Hypertension. Left ventricular hypertrophy.  EXAM: CHEST  2 VIEW  COMPARISON:  12/31/11  FINDINGS: Midline trachea. Moderate cardiomegaly. Mediastinal contours otherwise within normal limits. No pleural effusion or pneumothorax. No congestive failure. Clear lungs.  IMPRESSION: Cardiomegaly without congestive failure.   Electronically Signed   By: Abigail Miyamoto M.D.   On: 06/13/2013 01:57    ECG:  Sinus rhythm with LVH and repolarization abnormalities.  Impression and Recommendations:  1. Hypertensive Urgency: Noncompliance looks to be key precipitant. No insurance or PCP. Has not followed up with any physician over the last year. States he has had some medications that were given to him when he was discharged from ER in Sept of 2014, but ran out. He has since gotten  insurance and has appt with a physician at [place] on Friday of this week.  2. NICM: No cardiac work-up since August of 2013, where he was seen by Dr. Vaughan Browner in [place] [facility]. He will need to be re-established there once he is discharged. Echo last year demonstrated EF of <20% with moderate MR, and restrictive filling pattern.  CXR without obvious CHF on admission. Continue PO lasix, BB, and lisinopril. He has diuresed 1,500 cc since admission. He is currently on lasix 40 mg daily.   3. CKD: Creatinine 1.6-1.7 one year ago. Now 1.50. Renal ultrasound completed completed in 2013 negative for RAS.    Signed: Phill Myron. Purcell Nails NP [facility] 06/13/2013, 10:21 AM   Attending note:  Patient seen and examined. Reviewed records and modified above note by Ms. Lawrence NP. [name] [name] presents with hypertensive urgency and acute on chronic heart failure symptoms complicated by noncompliance with diet and medications. He has had no insurance, no health care followup over the last year, has not been consistently on medications for a least a month. He has ruled out for ACS, no frank edema by chest x-ray on admission. Last LVEF  was less than 20% with restrictive diastolic filling pattern and moderate mitral regurgitation as of June 2013. Blood pressure coming down with reinstitution of medications including Coreg, lisinopril, Aldactone, and Lasix. Degree of renal dysfunction is in similar range, creatinine 1.5. Echocardiogram has been obtained for reassessment of LVEF, diastolic parameters, and degree of mitral regurgitation. Critical importance of maintaining compliance with medications and followup was stressed. He will need to be reestablished in the CHF clinic for close followup after discharge. Can move out to telemetry.  Satira Sark, M.D., F.A.C.C.

## 2013-06-13 NOTE — Progress Notes (Signed)
UR chart review completed.  

## 2013-06-13 NOTE — H&P (Signed)
[facility] History and Physical  [name] [name]  [id number]  DOB: 25-Apr-1981   DOA: 06/13/2013   PCP:   No PCP Per Patient   Chief Complaint:  Chest discomfort with exertion for about a week  HPI: [name] [name] is a 32 y.o. male.   African American gentleman diagnosed with hypertension who has been noncompliant with medications for about 3 months presents to the emergency room with the above problem and was found to have markedly elevated blood pressure. The chest discomfort is associated with shortness of breath and symptoms are all relieved by rest. There is no dizziness no diaphoresis no syncope no nausea no vomiting.     Rewiew of Systems:   All systems negative except as marked bold or noted in the HPI;  Constitutional:    malaise, fever and chills. ;  Eyes:   eye pain, redness and discharge. ;  ENMT:   ear pain, hoarseness, nasal congestion, sinus pressure and sore throat. ;  Cardiovascular:    chest pain, palpitations, diaphoresis, and peripheral edema.  Respiratory: , hemoptysis, wheezing and stridor. ;  Gastrointestinal:  nausea, vomiting, diarrhea, constipation, abdominal pain, melena, blood in stool, hematemesis, jaundice and rectal bleeding. unusual weight loss..   Genitourinary:    frequency, dysuria, incontinence,flank pain and hematuria; Musculoskeletal:   back pain and neck pain.  swelling and trauma.;  Skin: .  pruritus, rash, abrasions, bruising and skin lesion.; ulcerations Neuro:    headache, lightheadedness and neck stiffness.  weakness, altered level of consciousness, altered mental status, extremity weakness, burning feet, involuntary movement, seizure and syncope.  Psych:    anxiety, depression, insomnia, tearfulness, panic attacks, hallucinations, paranoia, suicidal or homicidal ideation    Past Medical History  Diagnosis Date  . Pneumonia   . Cardiomyopathy     Echo 11/23/2010=>EF <20%, mod MR  . Acute on chronic systolic CHF (congestive  heart failure) 12/31/2011  . CKD (chronic kidney disease)     Cr 1.6-1.7 May/2012  . LVH (left ventricular hypertrophy) 12/31/2011  . Hypertension   . Anginal pain     Past Surgical History  Procedure Laterality Date  . Surgery scrotal / testicular      testicular torsion  . Cardiac catheterization  11/25/2010    no cad    Medications:  HOME MEDS: Prior to Admission medications   Medication Sig Start Date End Date Taking? Authorizing Provider  carvedilol (COREG) 12.5 MG tablet Take 1 tablet (12.5 mg total) by mouth 2 (two) times daily with a meal. 03/18/13   Veryl Speak, MD  furosemide (LASIX) 40 MG tablet Take 1 tablet (40 mg total) by mouth daily. 03/18/13 03/18/14  Veryl Speak, MD  lisinopril (PRINIVIL,ZESTRIL) 2.5 MG tablet Take 1 tablet (2.5 mg total) by mouth 2 (two) times daily. 03/18/13 03/18/14  Veryl Speak, MD  promethazine (PHENERGAN) 25 MG tablet Take 1 tablet (25 mg total) by mouth every 6 (six) hours as needed for nausea. 03/18/13   Veryl Speak, MD  spironolactone (ALDACTONE) 25 MG tablet Take 1 tablet (25 mg total) by mouth daily. 03/18/13 03/18/14  Veryl Speak, MD     Allergies:  No Known Allergies  Social History:   reports that he has never smoked. He does not have any smokeless tobacco history on file. He reports that he does not drink alcohol or use illicit drugs.  Family History: Family History  Problem Relation Age of Onset  . Breast cancer Mother   . Stroke Father  Physical Exam: Filed Vitals:   06/13/13 0300 06/13/13 0330 06/13/13 0400 06/13/13 0430  BP: 160/119 162/119 161/115 148/110  Pulse: 81 76    Temp:      TempSrc:      Resp: 28 0 26 25  Height:   5\' 10"  (1.778 m)   Weight:   98 kg (216 lb 0.8 oz)   SpO2:       Blood pressure 148/110, pulse 76, temperature 97.6 F (36.4 C), temperature source Oral, resp. rate 25, height 5\' 10"  (1.778 m), weight 98 kg (216 lb 0.8 oz), SpO2 96.00%. Body mass index is 31 kg/(m^2).   GEN:  Pleasant young  African American gentleman lying bed in no acute distress; cooperative with exam PSYCH:  alert and oriented x4;  neither anxious nor depressed; affect is appropriate. HEENT: Mucous membranes pink and anicteric; PERRLA; EOM intact; no cervical lymphadenopathy nor thyromegaly or carotid bruit; Breasts:: Not examined CHEST WALL: No tenderness CHEST: Normal respiration, clear to auscultation bilaterally HEART: Regular rate and rhythm;  BACK: No kyphosis no scoliosis; no CVA tenderness ABDOMEN: Obese, soft non-tender; no masses, no organomegaly, normal abdominal bowel sounds; no pannus; no intertriginous candida. Rectal Exam: Not done EXTREMITIES: No bone or joint deformity;  Genitalia: not examined PULSES: 2+ and symmetric SKIN: Normal hydration no rash or ulceration CNS: Cranial nerves 2-12 grossly intact no focal lateralizing neurologic deficit   Labs on Admission:  Basic Metabolic Panel:  Recent Labs Lab 06/13/13 0052  NA 136  K 4.1  CL 101  CO2 24  GLUCOSE 98  BUN 17  CREATININE 1.50*  CALCIUM 9.3   Liver Function Tests: No results found for this basename: AST, ALT, ALKPHOS, BILITOT, PROT, ALBUMIN,  in the last 168 hours No results found for this basename: LIPASE, AMYLASE,  in the last 168 hours No results found for this basename: AMMONIA,  in the last 168 hours CBC:  Recent Labs Lab 06/13/13 0052  WBC 7.9  NEUTROABS 3.7  HGB 15.4  HCT 44.8  MCV 80.6  PLT 198   Cardiac Enzymes:  Recent Labs Lab 06/13/13 0052  TROPONINI <0.30   BNP: No components found with this basename: POCBNP,  D-dimer: No components found with this basename: D-DIMER,  CBG: No results found for this basename: GLUCAP,  in the last 168 hours  Radiological Exams on Admission: Dg Chest 2 View  06/13/2013   CLINICAL DATA:  Chest pain and shortness of breath. History of cardiomyopathy. CHF. Hypertension. Left ventricular hypertrophy.  EXAM: CHEST  2 VIEW  COMPARISON:  12/31/11  FINDINGS:  Midline trachea. Moderate cardiomegaly. Mediastinal contours otherwise within normal limits. No pleural effusion or pneumothorax. No congestive failure. Clear lungs.  IMPRESSION: Cardiomegaly without congestive failure.   Electronically Signed   By: Abigail Miyamoto M.D.   On: 06/13/2013 01:57    EKG: Independently reviewed. Sinus rhythm, left H. and enlargement, left ventricular hypertrophy, repolarization abnormality.  Assessment/Plan    Active Problems:   Acute on chronic combined systolic and diastolic CHF (congestive heart failure)   CKD (chronic kidney disease)   Hypertensive urgency  PLAN: resume at a and including ACE inhibitors, bete block, Lasix and spine lateral Give IV dose of Lasix to kick start everything When necessary hydralazine while oral medications take effect   nitroglycerin by transdermal paste Counsel on the importance of compliance with medication for secondary prevention   We'll also check urine drugs  Other plans as per orders.  Code Status:  full  Britton Bera Nocturnist [facility] Pager [phone]   06/13/2013, 4:36 AM

## 2013-06-13 NOTE — Progress Notes (Signed)
*  PRELIMINARY RESULTS* Echocardiogram 2D Echocardiogram has been performed.  Yetter, [place] 06/13/2013, 2:49 PM

## 2013-06-13 NOTE — Progress Notes (Signed)
Nutrition Education Note  RD consulted for nutrition education regarding new onset CHF.  RD provided "Low Sodium Nutrition Therapy" handout from the Academy of [employer] Dietetics. Reviewed patient's dietary recall. Provided examples on ways to decrease sodium intake in diet. Discouraged intake of processed foods and use of salt shaker. Encouraged fresh fruits and vegetables as well as whole grain sources of carbohydrates to maximize fiber intake.   RD discussed why it is important for patient to adhere to diet recommendations, and emphasized the role of fluids, foods to avoid, and importance of weighing self daily. Teach back method used.  Expect fair compliance.  Body mass index is 31 kg/(m^2). Pt meets criteria for obesity class I based on current BMI.  Current diet order is Heart Healthy. Labs and medications reviewed. No further nutrition interventions warranted at this time. RD contact information provided. If additional nutrition issues arise, please re-consult RD.   Colman Cater MS,RD,CSG,LDN Office: [phone] Pager: [phone]

## 2013-06-14 DIAGNOSIS — Z91199 Patient's noncompliance with other medical treatment and regimen due to unspecified reason: Secondary | ICD-10-CM

## 2013-06-14 DIAGNOSIS — Z9119 Patient's noncompliance with other medical treatment and regimen: Secondary | ICD-10-CM

## 2013-06-14 LAB — COMPREHENSIVE METABOLIC PANEL
AST: 18 U/L (ref 0–37)
Albumin: 3.8 g/dL (ref 3.5–5.2)
Alkaline Phosphatase: 52 U/L (ref 39–117)
Chloride: 99 mEq/L (ref 96–112)
Creatinine, Ser: 1.99 mg/dL — ABNORMAL HIGH (ref 0.50–1.35)
Potassium: 3.6 mEq/L (ref 3.5–5.1)
Total Bilirubin: 0.5 mg/dL (ref 0.3–1.2)

## 2013-06-14 LAB — CBC
Hemoglobin: 15.8 g/dL (ref 13.0–17.0)
MCV: 81.1 fL (ref 78.0–100.0)
Platelets: 193 10*3/uL (ref 150–400)
RDW: 14.2 % (ref 11.5–15.5)
WBC: 5.4 10*3/uL (ref 4.0–10.5)

## 2013-06-14 MED ORDER — LISINOPRIL 5 MG PO TABS
2.5000 mg | ORAL_TABLET | Freq: Every day | ORAL | Status: DC
  Administered 2013-06-14: 2.5 mg via ORAL
  Filled 2013-06-14: qty 1

## 2013-06-14 MED ORDER — FUROSEMIDE 40 MG PO TABS: 40.0000 mg | ORAL_TABLET | Freq: Every day | ORAL | Status: DC

## 2013-06-14 MED ORDER — ISOSORBIDE DINITRATE 10 MG PO TABS: 10.0000 mg | ORAL_TABLET | Freq: Three times a day (TID) | ORAL | Status: DC

## 2013-06-14 MED ORDER — CARVEDILOL 12.5 MG PO TABS: 12.5000 mg | ORAL_TABLET | Freq: Two times a day (BID) | ORAL | Status: DC

## 2013-06-14 MED ORDER — HYDRALAZINE HCL 25 MG PO TABS: 25.0000 mg | ORAL_TABLET | Freq: Three times a day (TID) | ORAL | Status: DC

## 2013-06-14 MED ORDER — ASPIRIN 81 MG PO TABS: 81.0000 mg | ORAL_TABLET | Freq: Every day | ORAL | Status: DC | PRN

## 2013-06-14 NOTE — Discharge Summary (Signed)
PATIENT DETAILS Name: [name] [name] Age: 32 y.o. Sex: male Date of Birth: 07/23/1980 MRN: [id number]. Admit Date: 06/13/2013 Admitting Physician: Karlyn Agee, MD PCP:No PCP Per Patient  Recommendations for Outpatient Follow-up:  1. Please monitor renal function closely. 2. Will need continued counseling regarding compliance to medication.  PRIMARY DISCHARGE DIAGNOSIS:  Principal Problem:   Hypertensive urgency Active Problems:   Acute on chronic combined systolic and diastolic CHF (congestive heart failure)   Chest pain   HTN (hypertension)   CKD (chronic kidney disease) stage 3, GFR 30-59 ml/min   Acute on chronic combined systolic and diastolic heart failure   Nonischemic cardiomyopathy      PAST MEDICAL HISTORY: Past Medical History  Diagnosis Date  . History of pneumonia   . Nonischemic cardiomyopathy     Normal coronaries May 2012, LVEF < 20%  . Chronic systolic heart failure [id number]  . CKD (chronic kidney disease) stage 2, GFR 60-89 ml/min   . Essential hypertension, benign   . Noncompliance   . Mitral regurgitation     Moderate    DISCHARGE MEDICATIONS:   Medication List    STOP taking these medications       GOODY HEADACHE PO      TAKE these medications       aspirin 81 MG tablet  Take 1 tablet (81 mg total) by mouth daily as needed for mild pain.     carvedilol 12.5 MG tablet  Commonly known as:  COREG  Take 1 tablet (12.5 mg total) by mouth 2 (two) times daily with a meal.     furosemide 40 MG tablet  Commonly known as:  LASIX  Take 1 tablet (40 mg total) by mouth daily.     hydrALAZINE 25 MG tablet  Commonly known as:  APRESOLINE  Take 1 tablet (25 mg total) by mouth every 8 (eight) hours.     isosorbide dinitrate 10 MG tablet  Commonly known as:  ISORDIL  Take 1 tablet (10 mg total) by mouth 3 (three) times daily.        ALLERGIES:  No Known Allergies  BRIEF HPI:  See H&P, Labs, Consult and Test reports for all  details in brief, 32 year old man with history of known ischemic cardiomyopathy, last cardiac catheterization 11/2010 revealing normal coronary arteries, most recent LVEF less than [id number], grade 3 diastolic dysfunction echocardiogram 12/2011 who presented to the emergency department with chest pain and shortness of breath. He was admitted for hypertensive urgency, acute on chronic systolic and diastolic congestive heart failure and chest pressure  CONSULTATIONS:   cardiology  PERTINENT RADIOLOGIC STUDIES: Dg Chest 2 View  06/13/2013   CLINICAL DATA:  Chest pain and shortness of breath. History of cardiomyopathy. CHF. Hypertension. Left ventricular hypertrophy.  EXAM: CHEST  2 VIEW  COMPARISON:  12/31/11  FINDINGS: Midline trachea. Moderate cardiomegaly. Mediastinal contours otherwise within normal limits. No pleural effusion or pneumothorax. No congestive failure. Clear lungs.  IMPRESSION: Cardiomegaly without congestive failure.   Electronically Signed   By: Abigail Miyamoto M.D.   On: 06/13/2013 01:57     PERTINENT LAB RESULTS: CBC:  Recent Labs  06/13/13 0052 06/14/13 0414  WBC 7.9 5.4  HGB 15.4 15.8  HCT 44.8 46.7  PLT 198 193   CMET CMP     Component Value Date/Time   NA 137 06/14/2013 0414   K 3.6 06/14/2013 0414   CL 99 06/14/2013 0414   CO2 24 06/14/2013 0414   GLUCOSE 100* 06/14/2013 0414  BUN 26* 06/14/2013 0414   CREATININE 1.99* 06/14/2013 0414   CALCIUM 9.2 06/14/2013 0414   PROT 7.6 06/14/2013 0414   ALBUMIN 3.8 06/14/2013 0414   AST 18 06/14/2013 0414   ALT 19 06/14/2013 0414   ALKPHOS 52 06/14/2013 0414   BILITOT 0.5 06/14/2013 0414   GFRNONAA 43* 06/14/2013 0414   GFRAA 49* 06/14/2013 0414    GFR Estimated Creatinine Clearance: 62.6 ml/min (by C-G formula based on Cr of 1.99). No results found for this basename: LIPASE, AMYLASE,  in the last 72 hours  Recent Labs  06/13/13 0635 06/13/13 1211 06/13/13 1746  TROPONINI <0.30 <0.30 <0.30   No components found with this  basename: POCBNP,  No results found for this basename: DDIMER,  in the last 72 hours No results found for this basename: HGBA1C,  in the last 72 hours No results found for this basename: CHOL, HDL, LDLCALC, TRIG, CHOLHDL, LDLDIRECT,  in the last 72 hours  Recent Labs  06/13/13 0635  TSH 1.290   No results found for this basename: VITAMINB12, FOLATE, FERRITIN, TIBC, IRON, RETICCTPCT,  in the last 72 hours Coags: No results found for this basename: PT, INR,  in the last 72 hours Microbiology: Recent Results (from the past 240 hour(s))  MRSA PCR SCREENING     Status: None   Collection Time    06/13/13  4:06 AM      Result Value Range Status   MRSA by PCR NEGATIVE  NEGATIVE Final   Comment:            The GeneXpert MRSA Assay (FDA     approved for NASAL specimens     only), is one component of a     comprehensive MRSA colonization     surveillance program. It is not     intended to diagnose MRSA     infection nor to guide or     monitor treatment for     MRSA infections.     BRIEF HOSPITAL COURSE:   Principal Problem:   Hypertensive urgency - Secondary to noncompliance, off all medications for one month. - This was resolved after reinitiating antihypertensive medications. - Blood pressure currently stable, we'll discharge with the above noted medications. Will need continued close followup in the outpatient setting and further optimization of his antihypertensive regimen.  Active Problems: Chest pain:  -Resolved. Suspect secondary to hypertensive urgency and heart failure. Troponins cycled and they were negative. Repeat echocardiogram done this admission showed EF of 20-25% with diffuse hypokinesis. Cardiology was consulted, no further workup recommended.    Acute on chronic combined systolic and diastolic CHF (congestive heart failure) - Furosemide, clinically compensated by the time of discharge. Given worsening renal failure, ACE inhibitor will be discontinued. Patient  will be started on hydralazine and Imdur, we will continue Coreg on discharge. He'll also be continued on Lasix, but given worsening of renal function, Aldactone will also be discontinued. - Followup appointment with the heart failure clinic will be arranged by  Chronic daily disease stage III - Slight bump in the patient's creatinine, suspect this is secondary to hemodynamic issues-better BP control. He would cardiology in discontinuing ACE inhibitor and Aldactone. Patient has a followup appointment with his new primary care practitioner at [facility], I have asked the patient and his girlfriend to make sure that the chemistry panel is done on 12/5 to reassess renal function. They both have claimed understanding.  Noncompliance to medication - This is secondary to patient  losing his health insurance. He has been extensively counseled, fortunately, he now has insurance. Followup appointment with his new PCP at [facility] has been arranged for 12/5, cardiology will arrange followup with the heart failure clinic.  TODAY-DAY OF DISCHARGE:  Subjective:   [place] today has no headache,no chest abdominal pain,no new weakness tingling or numbness, feels much better wants to go home today.  Objective:   Blood pressure 147/100, pulse 76, temperature 98.2 F (36.8 C), temperature source Oral, resp. rate 25, height 5\' 10"  (1.778 m), weight 97.977 kg (216 lb), SpO2 96.00%.  Intake/Output Summary (Last 24 hours) at 06/14/13 1218 Last data filed at 06/14/13 0000  Gross per 24 hour  Intake    240 ml  Output      0 ml  Net    240 ml   Filed Weights   06/12/13 2327 06/13/13 0400 06/14/13 0555  Weight: 99.791 kg (220 lb) 98 kg (216 lb 0.8 oz) 97.977 kg (216 lb)    Exam Awake Alert, Oriented *3, No new F.N deficits, Normal affect [place].AT,PERRAL Supple Neck,No JVD, No cervical lymphadenopathy appriciated.  Symmetrical Chest wall movement, Good air movement  bilaterally, CTAB RRR,No Gallops,Rubs or new Murmurs, No Parasternal Heave +ve B.Sounds, Abd Soft, Non tender, No organomegaly appriciated, No rebound -guarding or rigidity. No Cyanosis, Clubbing or edema, No new Rash or bruise  DISCHARGE CONDITION: Stable  DISPOSITION: Home  DISCHARGE INSTRUCTIONS:    Activity:  As tolerated  Diet recommendation: Heart Healthy diet Fluid restriction 1.5 lit/day       Discharge Orders   Future Appointments Provider Department Dept Phone   06/27/2013 9:40 AM [facility] [phone]   Future Orders Complete By Expires   (HEART FAILURE PATIENTS) Call MD:  Anytime you have any of the following symptoms: 1) 3 pound weight gain in 24 hours or 5 pounds in 1 week 2) shortness of breath, with or without a dry hacking cough 3) swelling in the hands, feet or stomach 4) if you have to sleep on extra pillows at night in order to breathe.  As directed    Diet - low sodium heart healthy  As directed    Discharge instructions  As directed    Comments:     Please followup with her primary care practitioner on 12/5 and please get a basic metabolic panel checked.   Increase activity slowly  As directed       Follow-up Information   Follow up with [facility] On 06/16/2013. (appt at 2 pm, please get BMET check at this visit)    Contact information:   [place] [id number] [phone]      Follow up with [facility] On 06/27/2013. (appt at 9:40 AM)    Specialty:  Cardiology   Contact information:   [id number] [place] [address] [phone]     Total Time spent on discharge equals 45 minutes.  SignedOren Binet 06/14/2013 12:18 PM

## 2013-06-14 NOTE — Progress Notes (Signed)
Patient discharged to home, in own care. Specific instructions given regarding monitoring daily weights, and when to call PCP with wt gain, dyspnea, or chest pain. Scale given to patient for home use. Patient also given appointment to see [facility]. Patient strongly encouraged to adhere to prescribed medications and follow -ups to prevent admissions, and advancing of chronic kidney disease. Patient expressed understanding of teaching, and was able to "teach back" information.

## 2013-06-14 NOTE — Progress Notes (Signed)
Subjective:  Feeling better. Wants to go home.  Objective:  Vital Signs in the last 24 hours: Temp:  [97.7 F (36.5 C)-98.4 F (36.9 C)] 97.9 F (36.6 C) (12/03 0555) Resp:  [15-27] 20 (12/03 0500) BP: (106-169)/(64-113) 106/64 mmHg (12/03 0500) Weight:  [216 lb (97.977 kg)] 216 lb (97.977 kg) (12/03 0555)  Intake/Output from previous day: 12/02 0701 - 12/03 0700 In: 240 [P.O.:240] Out: 2100 [Urine:2100] Intake/Output from this shift:    Physical Exam: NECK: Without JVD, HJR, or bruit LUNGS: Clear anterior, posterior, lateral HEART: Regular rate and rhythm, no murmur, gallop, rub, bruit, thrill, or heave EXTREMITIES: Without cyanosis, clubbing, or edema   Lab Results:  Recent Labs  06/13/13 0052 06/14/13 0414  WBC 7.9 5.4  HGB 15.4 15.8  PLT 198 193    Recent Labs  06/13/13 0052 06/14/13 0414  NA 136 137  K 4.1 3.6  CL 101 99  CO2 24 24  GLUCOSE 98 100*  BUN 17 26*  CREATININE 1.50* 1.99*    Recent Labs  06/13/13 1211 06/13/13 1746  TROPONINI <0.30 <0.30   Hepatic Function Panel  Recent Labs  06/14/13 0414  PROT 7.6  ALBUMIN 3.8  AST 18  ALT 19  ALKPHOS 52  BILITOT 0.5   No results found for this basename: CHOL,  in the last 72 hours No results found for this basename: PROTIME,  in the last 72 hours  Imaging: Study Conclusions  - Left ventricle: The cavity size was at the upper limits of   normal. Wall thickness was increased in a pattern of   severe LVH. Systolic function was severely reduced. The   estimated ejection fraction was in the range of 20% to   25%. Diffuse hypokinesis with some regional variation.   Features are consistent with a pseudonormal left   ventricular filling pattern, with concomitant abnormal   relaxation and increased filling pressure (grade 2   diastolic dysfunction). - Aortic valve: Trivial regurgitation. Mean gradient: 51mm Hg   (S). - Aortic root: The aortic root was mildly ectatic. - Mitral valve:  Minimally calcified annulus. Mild   regurgitation. - Left atrium: The atrium was severely dilated. - Right atrium: Central venous pressure: 59mm Hg (est). - Tricuspid valve: Trivial regurgitation. - Pulmonary arteries: PA peak pressure: 3mm Hg (S). - Pericardium, extracardiac: There was no pericardial   effusion. Impressions:  - Comparison to prior study June 2013. Severe LVH with upper   normal chamber and LVEF 20-25%. Grade 2 diastolic   dysfunction - improved somewhat compared to previous.   Severe left atrial enlargement. Mild mitral regurgitation.   Trivial aortic regurgitation with mildly ectatic aortic   root. Trivial tricuspid regurgitation with PASP 19 mmHg.   Low normal CVP.   Cardiac Studies:  Assessment/Plan:  1. Hypertensive Urgency: secondary to noncompliance, now back on meds and bp improving. Would continue current meds. Patient has appt. At [facility] on Friday. Should have a BMET checked then. Will schedule an appointment with Dr. Haroldine Laws in CHF clinic.  2. NICM: EF 20-25% on 2Decho, grade 2 diastolic dysfunction.Diuresed 1860cc on Lasix 40mg  po. 3. CKD: Creatinine 1.9. Continue to monitor. Needs BMET Friday.  LOS: 1 day    Ermalinda Barrios PA-C 06/14/2013, 8:06 AM   Attending Note Patient seen and discussed with NP Lenze. He is a 32 yo male with NICM LVEF 20-25% by echo during this admission, prior cath 11/2010 with patent coronaries. He does not have an ICD as he has had  mixed compliance with appointments and medications for mainly financial reasons. He also has HTN and CKD stage II to III. He was admitted with hypertensive urgency after being off his medications for quite some time. Blood pressures have trended back down with re initiation of his home regimen. Of note his Cr has increased from 1.7 in 03/2013 to 2 this admission, I suspect this is progression of his CKD due to poor compliance with blood pressure medications. In this setting I recommend stopping his  ACE-I and spironolactone, concern given his poor renal function that he is at risk for progression of renal disease or hyperkalemia. He also has not maintained the kind of consistent follow up that would be needed to consider continuing these medications due to his increased risk. Will stop ACE-I and spironolactone, start hydralazine and isosorbide which can be titrated at follow up, we will arrange close follow up with CHF clinic. Can consider discharge today from our standpoint.   Carlyle Dolly MD

## 2013-06-27 ENCOUNTER — Ambulatory Visit (HOSPITAL_COMMUNITY)
Admit: 2013-06-27 | Discharge: 2013-06-27 | Disposition: A | Discharge status: Home / Self Care | Payer: BC Managed Care – PPO | Attending: Internal Medicine | Admitting: Internal Medicine

## 2013-06-27 ENCOUNTER — Encounter (HOSPITAL_COMMUNITY): Payer: Self-pay

## 2013-06-27 VITALS — BP 168/108 | HR 80 | Ht 70.0 in | Wt 216.8 lb

## 2013-06-27 DIAGNOSIS — N179 Acute kidney failure, unspecified: Secondary | ICD-10-CM

## 2013-06-27 DIAGNOSIS — I5022 Chronic systolic (congestive) heart failure: Secondary | ICD-10-CM | POA: Insufficient documentation

## 2013-06-27 DIAGNOSIS — N189 Chronic kidney disease, unspecified: Secondary | ICD-10-CM

## 2013-06-27 DIAGNOSIS — I10 Essential (primary) hypertension: Secondary | ICD-10-CM

## 2013-06-27 DIAGNOSIS — N182 Chronic kidney disease, stage 2 (mild): Secondary | ICD-10-CM | POA: Insufficient documentation

## 2013-06-27 DIAGNOSIS — I129 Hypertensive chronic kidney disease with stage 1 through stage 4 chronic kidney disease, or unspecified chronic kidney disease: Secondary | ICD-10-CM | POA: Insufficient documentation

## 2013-06-27 DIAGNOSIS — I5042 Chronic combined systolic (congestive) and diastolic (congestive) heart failure: Secondary | ICD-10-CM

## 2013-06-27 LAB — BASIC METABOLIC PANEL
BUN: 15 mg/dL (ref 6–23)
Calcium: 9.2 mg/dL (ref 8.4–10.5)
GFR calc Af Amer: 74 mL/min — ABNORMAL LOW (ref >90)
GFR calc non Af Amer: 64 mL/min — ABNORMAL LOW (ref >90)
Glucose, Bld: 94 mg/dL (ref 70–99)
Potassium: 3.7 mEq/L (ref 3.5–5.1)
Sodium: 140 mEq/L (ref 135–145)

## 2013-06-27 MED ORDER — HYDRALAZINE HCL 50 MG PO TABS: 50.0000 mg | ORAL_TABLET | Freq: Three times a day (TID) | ORAL | Status: DC

## 2013-06-27 NOTE — Addendum Note (Signed)
Encounter addended by: Scarlette Calico, RN on: 06/27/2013 10:31 AM<BR>     Documentation filed: Patient Instructions Section, Orders

## 2013-06-27 NOTE — Addendum Note (Signed)
Encounter addended by: Scarlette Calico, RN on: 06/27/2013 10:32 AM<BR>     Documentation filed: Orders

## 2013-06-27 NOTE — Patient Instructions (Signed)
Increase Hydralazine to 50 mg Three times a day   Labs today  Your physician recommends that you schedule a follow-up appointment in: 1 month

## 2013-06-27 NOTE — Progress Notes (Signed)
Patient ID: [name] [name], male   DOB: July 08, 1981, 32 y.o.   MRN: [id number]  Primary PCP: None Primary Cardiologist: Dr. Haroldine Laws  HPI: [name] is a 32 y.o.male with known history of NICM, hypertension and CKD stage II (baseline Cr 1.5-1.7).   He was first diagnosed with HF in 2012 and his EF recovered and was instructed to stop taking carvedilol, spironolactone, and lasix by previous cardiologist. Admitted to [facility] 123456 for acute systolic heart failure. 12/31/11 ProBNP 3777. HIV nonreactive. Thyroid panel normal. Renal ultrasound was negative for obstruction. EF 20%. Discharge weight 193 lbs.   ECHO 06/13/13: EF 20-25%  [facility] Follow up: He was last seen in the HF clinic 02/2012 and at that time he was pretty stable other than still having issues of running out of medications. Admitted 06/13/13 for hypertensive urgency BP 182/126, secondary to non-compliance.  EF 20-25%. Diuresed and meds restarted. Discharge weight 216 lbs. Overall feeling better but tired today as he worked 3rd shift at [name] last night. Taking medicines regularly but didn't this am yet. Unable to tolerate Imdur due to severe HAs. (PCP stopped and increased carvedilol) Not checking BP at home or work. No dyspnea, edema, orthopnea or PND. Weighing every day. Weight stable at 211-212   06/14/13: K 3.6 Cr 2.00  ROS: All systems negative except as listed in HPI, PMH and Problem List.  Past Medical History  Diagnosis Date  . History of pneumonia   . Nonischemic cardiomyopathy     Normal coronaries May 2012, LVEF < 20%  . Chronic systolic heart failure [id number]  . CKD (chronic kidney disease) stage 2, GFR 60-89 ml/min   . Essential hypertension, benign   . Noncompliance   . Mitral regurgitation     Moderate    Current Outpatient Prescriptions  Medication Sig Dispense Refill  . aspirin 81 MG tablet Take 1 tablet (81 mg total) by mouth daily as needed for mild pain.  30 tablet  0  . carvedilol (COREG) 25  MG tablet Take 25 mg by mouth 2 (two) times daily with a meal.      . furosemide (LASIX) 40 MG tablet Take 1 tablet (40 mg total) by mouth daily.  30 tablet  0  . hydrALAZINE (APRESOLINE) 25 MG tablet Take 1 tablet (25 mg total) by mouth every 8 (eight) hours.  90 tablet  0   No current facility-administered medications for this encounter.      Filed Vitals:   06/27/13 0951  BP: 168/108  Pulse: 32  Height: 5\' 10"  (1.778 m)  Weight: 216 lb 12.8 oz (98.34 kg)  SpO2: 99%    PHYSICAL EXAM: General:  Well appearing. No resp difficulty HEENT: normal Neck: supple. JVP flat. Carotids 2+ bilaterally; no bruits. No lymphadenopathy or thryomegaly appreciated. Cor: PMI normal. Regular rate & rhythm. No rubs, gallops or murmurs. Lungs: clear Abdomen: soft, nontender, nondistended. No hepatosplenomegaly. No bruits or masses. Good bowel sounds. Extremities: no cyanosis, clubbing, rash, edema Neuro: alert & orientedx3, cranial nerves grossly intact. Moves all 4 extremities w/o difficulty. Affect pleasant.   ASSESSMENT & PLAN:  1) Chronic systolic HF: NICM, EF 0000000 (06/2013) --He has recurrent LV dysfunction due the HTN-CM. Currently NYHA I. Volume status looks good. --BP still elevated. Not on ACE or ARB due kidney dysfunction. Will increase hydralazine to 50 tid. (unable to tolerate Imdur) Encouraged him to take BP at work or home --Reinforced need for daily weights and reviewed use of sliding scale diuretics. Take  extra lasix for weight 215 or greater --Labs today - if K low add spiro 12.5 daily --Will screen for GUIDE-IT  2) HTN --BP up. Increasing hydralazine as above. Stressed need for control of BP to reduce risk of CVA and ESRD   3) CKD stage II (baseline Cr 1.5-1.7) --As above. At some point I would like to try to add back on ACE or ARB  4) Snoring --Has mild polycythemia. Likely has OSA. Consider sleeps study in future. Keep weight down.    Daniel Bensimhon,MD 10:18  AM

## 2013-07-05 ENCOUNTER — Telehealth (HOSPITAL_COMMUNITY): Payer: Self-pay | Admitting: *Deleted

## 2013-07-05 MED ORDER — SPIRONOLACTONE 25 MG PO TABS: 12.5000 mg | ORAL_TABLET | Freq: Every day | ORAL | Status: DC

## 2013-07-05 NOTE — Telephone Encounter (Signed)
Message copied by Scarlette Calico on Wed Jul 05, 2013 12:33 PM ------      Message from: Jolaine Artist      Created: Tue Jun 27, 2013  1:24 PM       Start spiro 12.5 daily. Check BMET 1 week. ------

## 2013-07-05 NOTE — Telephone Encounter (Signed)
rx sent in pt aware, labs 12/30 at [facility]

## 2013-07-26 ENCOUNTER — Inpatient Hospital Stay (HOSPITAL_COMMUNITY): Admission: RE | Admit: 2013-07-26 | Payer: BC Managed Care – PPO | Source: Ambulatory Visit

## 2013-08-04 ENCOUNTER — Other Ambulatory Visit: Payer: Self-pay | Admitting: Internal Medicine

## 2013-08-10 ENCOUNTER — Ambulatory Visit (HOSPITAL_COMMUNITY)
Admission: RE | Admit: 2013-08-10 | Discharge: 2013-08-10 | Disposition: A | Discharge status: Home / Self Care | Payer: BC Managed Care – PPO | Source: Ambulatory Visit | Attending: Internal Medicine | Admitting: Internal Medicine

## 2013-08-10 VITALS — BP 154/106 | HR 79 | Wt 214.0 lb

## 2013-08-10 DIAGNOSIS — N189 Chronic kidney disease, unspecified: Secondary | ICD-10-CM

## 2013-08-10 DIAGNOSIS — I5042 Chronic combined systolic (congestive) and diastolic (congestive) heart failure: Secondary | ICD-10-CM

## 2013-08-10 DIAGNOSIS — N182 Chronic kidney disease, stage 2 (mild): Secondary | ICD-10-CM | POA: Insufficient documentation

## 2013-08-10 DIAGNOSIS — I129 Hypertensive chronic kidney disease with stage 1 through stage 4 chronic kidney disease, or unspecified chronic kidney disease: Secondary | ICD-10-CM | POA: Insufficient documentation

## 2013-08-10 DIAGNOSIS — I10 Essential (primary) hypertension: Secondary | ICD-10-CM

## 2013-08-10 DIAGNOSIS — I5022 Chronic systolic (congestive) heart failure: Secondary | ICD-10-CM | POA: Insufficient documentation

## 2013-08-10 MED ORDER — SPIRONOLACTONE 25 MG PO TABS: 12.5000 mg | ORAL_TABLET | Freq: Every day | ORAL | Status: DC

## 2013-08-10 MED ORDER — HYDRALAZINE HCL 100 MG PO TABS: 100.0000 mg | ORAL_TABLET | Freq: Two times a day (BID) | ORAL | Status: DC

## 2013-08-10 NOTE — Progress Notes (Signed)
Patient ID: [name] [name], male   DOB: [birth year], 33 y.o.   MRN: [id number]  Primary PCP: [place]  Primary Cardiologist: Dr. Haroldine Laws  HPI: [name] [name] is a 33 y.o.male with known history of NICM, hypertension and CKD stage II (baseline Cr 1.5-1.7).   He was first diagnosed with HF in 2012 and his EF recovered and was instructed to stop taking carvedilol, spironolactone, and lasix by previous cardiologist. Admitted to [facility] 123456 for acute systolic heart failure. 12/31/11 ProBNP 3777. HIV nonreactive. Thyroid panel normal. Renal ultrasound was negative for obstruction. EF 20%. Discharge weight 193 lbs.   ECHO 06/13/13: EF 20-25%  He returns for follow up. Last visit 12.5 mg spironolactone was started but he did not pick it up which was 6 weeks ago. He has been out of lasix for 2 weeks. He says he only can take hydralazine twice a day because he works third shift. Denies SOB/PND/Orthopnea. Works full time at [employer] on third shirt. Weight at home 207-209 pounds. Tries to follow low salt diet. Drinking > 2 liters of fluid per day.    06/14/13: K 3.6 Cr 2.00 06/27/14 K 3.7 Creatinine 1.42   ROS: All systems negative except as listed in HPI, PMH and Problem List.  Past Medical History  Diagnosis Date  . History of pneumonia   . Nonischemic cardiomyopathy     Normal coronaries May 2012, LVEF < 20%  . Chronic systolic heart failure [id number]  . CKD (chronic kidney disease) stage 2, GFR 60-89 ml/min   . Essential hypertension, benign   . Noncompliance   . Mitral regurgitation     Moderate    Current Outpatient Prescriptions  Medication Sig Dispense Refill  . aspirin 81 MG tablet Take 1 tablet (81 mg total) by mouth daily as needed for mild pain.  30 tablet  0  . carvedilol (COREG) 25 MG tablet Take 25 mg by mouth 2 (two) times daily with a meal.      . hydrALAZINE (APRESOLINE) 50 MG tablet Take 50 mg by mouth 2 (two) times daily.      . furosemide (LASIX) 40 MG tablet Take  1 tablet (40 mg total) by mouth daily.  30 tablet  0  . spironolactone (ALDACTONE) 25 MG tablet Take 0.5 tablets (12.5 mg total) by mouth daily.  30 tablet  3   No current facility-administered medications for this encounter.      Filed Vitals:   08/10/13 1415  BP: 154/106  Pulse: 79  Weight: 214 lb (97.07 kg)  SpO2: 98%    PHYSICAL EXAM: General:  Well appearing. No resp difficulty Girl friend present HEENT: normal Neck: supple. JVP 5-6 . Carotids 2+ bilaterally; no bruits. No lymphadenopathy or thryomegaly appreciated. Cor: PMI normal. Regular rate & rhythm. No rubs, gallops or murmurs. Lungs: clear Abdomen: soft, nontender, nondistended. No hepatosplenomegaly. No bruits or masses. Good bowel sounds. Extremities: no cyanosis, clubbing, rash, edema Neuro: alert & orientedx3, cranial nerves grossly intact. Moves all 4 extremities w/o difficulty. Affect pleasant.   ASSESSMENT & PLAN:  1) Chronic systolic HF: NICM, EF 0000000 (06/2013) --He has recurrent LV dysfunction due the HTN-CM. Currently NYHA I. Volume status stable. Continue lasix 40 mg daily and 12.5 mg spironolactone daily. I have reordered both today.  Increase hydralazine to 100 mg twice a day. He is unable to take a third dose due to his work schedule. He is not Imdur due to headaches. He is not on Ace due to elevated  creatinine.  Repeat BMET in 10 days at [facility] with results faxed to HF clinic.  Plan to repeat ECHO in March 2015 after HF meds optimized. If EF remains < 35% will need to refer to EP for ICD.  Reinforced medication compliance, limiting fluid intake to < 2 liters per day, and daily weights.  --2) HTN : Elevated but he has not been taking lasix or spironolactone. Also only taking hydralazine twice a day. As noted above reordered spironolactone and will change hydralazine to 100 mg twice a day. He is unable to take a 3 rd dose of hydralazine due to his work schedule. I have reinforced medication  compliance. 3) CKD stage II:  (baseline Cr 1.5-1.7) Repeat BMET in 10 days.  4) Snoring: Has mild polycythemia. Likely has OSA. Consider sleeps study in future. Keep weight down.   Follow up in 3 weeks.    CLEGG,AMY NP-C  2:19 PM

## 2013-08-10 NOTE — Patient Instructions (Signed)
Follow up in 3 weeks  Take 12.5 Spironolactone daily  Take hydralazine 100 mg twice a day   Do the following things EVERYDAY: 1) Weigh yourself in the morning before breakfast. Write it down and keep it in a log. 2) Take your medicines as prescribed 3) Eat low salt foods-Limit salt (sodium) to 2000 mg per day.  4) Stay as active as you can everyday 5) Limit all fluids for the day to less than 2 liters

## 2013-08-15 ENCOUNTER — Other Ambulatory Visit: Payer: Self-pay | Admitting: Internal Medicine

## 2013-08-17 ENCOUNTER — Other Ambulatory Visit (HOSPITAL_COMMUNITY): Payer: Self-pay | Admitting: Cardiology

## 2013-08-17 DIAGNOSIS — I509 Heart failure, unspecified: Secondary | ICD-10-CM

## 2013-08-17 MED ORDER — FUROSEMIDE 40 MG PO TABS: 40.0000 mg | ORAL_TABLET | Freq: Every day | ORAL | Status: DC

## 2013-08-31 ENCOUNTER — Encounter (HOSPITAL_COMMUNITY): Payer: Self-pay

## 2013-08-31 ENCOUNTER — Inpatient Hospital Stay (HOSPITAL_COMMUNITY): Admission: RE | Admit: 2013-08-31 | Payer: BC Managed Care – PPO | Source: Ambulatory Visit

## 2013-12-20 ENCOUNTER — Encounter (HOSPITAL_COMMUNITY): Payer: Self-pay

## 2014-03-06 ENCOUNTER — Emergency Department (HOSPITAL_COMMUNITY)
Admission: EM | Admit: 2014-03-06 | Discharge: 2014-03-07 | Disposition: A | Discharge status: Home / Self Care | Payer: BC Managed Care – PPO | Attending: Emergency Medicine | Admitting: Emergency Medicine

## 2014-03-06 ENCOUNTER — Emergency Department (HOSPITAL_COMMUNITY): Payer: BC Managed Care – PPO

## 2014-03-06 ENCOUNTER — Encounter (HOSPITAL_COMMUNITY): Payer: Self-pay | Admitting: Emergency Medicine

## 2014-03-06 DIAGNOSIS — Z8701 Personal history of pneumonia (recurrent): Secondary | ICD-10-CM | POA: Insufficient documentation

## 2014-03-06 DIAGNOSIS — I5022 Chronic systolic (congestive) heart failure: Secondary | ICD-10-CM | POA: Diagnosis not present

## 2014-03-06 DIAGNOSIS — F411 Generalized anxiety disorder: Secondary | ICD-10-CM | POA: Insufficient documentation

## 2014-03-06 DIAGNOSIS — R079 Chest pain, unspecified: Secondary | ICD-10-CM | POA: Diagnosis present

## 2014-03-06 DIAGNOSIS — I10 Essential (primary) hypertension: Secondary | ICD-10-CM

## 2014-03-06 DIAGNOSIS — N182 Chronic kidney disease, stage 2 (mild): Secondary | ICD-10-CM | POA: Insufficient documentation

## 2014-03-06 DIAGNOSIS — Z79899 Other long term (current) drug therapy: Secondary | ICD-10-CM | POA: Insufficient documentation

## 2014-03-06 DIAGNOSIS — I129 Hypertensive chronic kidney disease with stage 1 through stage 4 chronic kidney disease, or unspecified chronic kidney disease: Secondary | ICD-10-CM | POA: Insufficient documentation

## 2014-03-06 LAB — COMPREHENSIVE METABOLIC PANEL
ALBUMIN: 3.8 g/dL (ref 3.5–5.2)
ALT: 14 U/L (ref 0–53)
AST: 17 U/L (ref 0–37)
Alkaline Phosphatase: 66 U/L (ref 39–117)
Anion gap: 15 (ref 5–15)
BUN: 22 mg/dL (ref 6–23)
CALCIUM: 8.8 mg/dL (ref 8.4–10.5)
CO2: 23 mEq/L (ref 19–32)
CREATININE: 1.8 mg/dL — AB (ref 0.50–1.35)
Chloride: 98 mEq/L (ref 96–112)
GFR calc Af Amer: 56 mL/min — ABNORMAL LOW (ref >90)
GFR, EST NON AFRICAN AMERICAN: 48 mL/min — AB (ref >90)
Glucose, Bld: 115 mg/dL — ABNORMAL HIGH (ref 70–99)
Potassium: 3.4 mEq/L — ABNORMAL LOW (ref 3.7–5.3)
SODIUM: 136 meq/L — AB (ref 137–147)
TOTAL PROTEIN: 7.5 g/dL (ref 6.0–8.3)
Total Bilirubin: 0.2 mg/dL — ABNORMAL LOW (ref 0.3–1.2)

## 2014-03-06 LAB — CBC WITH DIFFERENTIAL/PLATELET
BASOS PCT: 0 % (ref 0–1)
Basophils Absolute: 0 10*3/uL (ref 0.0–0.1)
EOS ABS: 0.1 10*3/uL (ref 0.0–0.7)
EOS PCT: 2 % (ref 0–5)
HEMATOCRIT: 42.8 % (ref 39.0–52.0)
Hemoglobin: 15 g/dL (ref 13.0–17.0)
Lymphocytes Relative: 31 % (ref 12–46)
Lymphs Abs: 2.4 10*3/uL (ref 0.7–4.0)
MCH: 27.9 pg (ref 26.0–34.0)
MCHC: 35 g/dL (ref 30.0–36.0)
MCV: 79.6 fL (ref 78.0–100.0)
MONO ABS: 0.5 10*3/uL (ref 0.1–1.0)
Monocytes Relative: 7 % (ref 3–12)
Neutro Abs: 4.6 10*3/uL (ref 1.7–7.7)
Neutrophils Relative %: 60 % (ref 43–77)
Platelets: 195 10*3/uL (ref 150–400)
RBC: 5.38 MIL/uL (ref 4.22–5.81)
RDW: 13.8 % (ref 11.5–15.5)
WBC: 7.7 10*3/uL (ref 4.0–10.5)

## 2014-03-06 LAB — TROPONIN I

## 2014-03-06 MED ORDER — LABETALOL HCL 5 MG/ML IV SOLN
40.0000 mg | Freq: Once | INTRAVENOUS | Status: AC
  Administered 2014-03-06: 40 mg via INTRAVENOUS
  Filled 2014-03-06: qty 8

## 2014-03-06 NOTE — ED Provider Notes (Signed)
CSN: [id number]     Arrival date & time 03/06/14  2022 History  This chart was scribed for Maudry Diego, MD by Lowella Petties, ED Scribe. The patient was seen in room APA04/APA04. Patient's care was started at 9:41 PM.    Chief Complaint  Patient presents with  . Chest Pain  . Anxiety   Patient is a 33 y.o. male presenting with chest pain. The history is provided by the patient. No language interpreter was used.  Chest Pain Pain location:  L chest Pain quality: pressure   Pain radiates to:  Does not radiate Pain radiates to the back: no   Pain severity:  Mild Onset quality:  Sudden Timing:  Intermittent Progression:  Resolved Chronicity:  Recurrent Relieved by:  Nothing Worsened by:  Nothing tried Ineffective treatments:  None tried Associated symptoms: anxiety   Associated symptoms: no back pain, no cough, no fatigue and no shortness of breath   Risk factors: hypertension and male sex    HPI Comments: [name] [name] is a 33 y.o. male who presents to the Emergency Department complaining of intermittent, mild, left sided chest pain. He denies SOB. He states that he had Pneumonia untreated for a long time that caused a series of health problems including HTN. He reports a history of Congestive Heart Failure.   Past Medical History  Diagnosis Date  . History of pneumonia   . Nonischemic cardiomyopathy     Normal coronaries May 2012, LVEF < 20%  . Chronic systolic heart failure [id number]  . CKD (chronic kidney disease) stage 2, GFR 60-89 ml/min   . Essential hypertension, benign   . Noncompliance   . Mitral regurgitation     Moderate   Past Surgical History  Procedure Laterality Date  . Surgery scrotal / testicular      Testicular torsion   Family History  Problem Relation Age of Onset  . Breast cancer Mother   . Stroke Father    History  Substance Use Topics  . Smoking status: Never Smoker   . Smokeless tobacco: Not on file  . Alcohol Use: Yes     Comment:  occasionally    Review of Systems  Constitutional: Negative for appetite change and fatigue.  HENT: Negative for congestion, ear discharge and sinus pressure.   Eyes: Negative for discharge.  Respiratory: Negative for cough and shortness of breath.   Cardiovascular: Positive for chest pain.  Gastrointestinal: Negative for diarrhea.  Genitourinary: Negative for frequency and hematuria.  Musculoskeletal: Negative for back pain.  Skin: Negative for rash.  Neurological: Negative for seizures.  Psychiatric/Behavioral: Negative for hallucinations.   Allergies  Review of patient's allergies indicates no known allergies.  Home Medications   Prior to Admission medications   Medication Sig Start Date End Date Taking? Authorizing Provider  carvedilol (COREG) 25 MG tablet Take 25 mg by mouth 2 (two) times daily with a meal.   Yes Historical Provider, MD  furosemide (LASIX) 40 MG tablet Take 1 tablet (40 mg total) by mouth daily. 08/17/13  Yes Jolaine Artist, MD  hydrALAZINE (APRESOLINE) 100 MG tablet Take 1 tablet (100 mg total) by mouth 2 (two) times daily. 08/10/13  Yes Amy D Clegg, NP   Triage vitals: BP 198/130  Pulse 75  Temp(Src) 98.6 F (37 C) (Oral)  Resp 24  Ht 5\' 10"  (1.778 m)  Wt 220 lb (99.791 kg)  BMI 31.57 kg/m2  SpO2 99% Physical Exam  Constitutional: He is oriented to person,  place, and time. He appears well-developed.  HENT:  Head: Normocephalic.  Eyes: Conjunctivae and EOM are normal. No scleral icterus.  Neck: Neck supple. No thyromegaly present.  Cardiovascular: Normal rate and regular rhythm.  Exam reveals no gallop and no friction rub.   No murmur heard. Pulmonary/Chest: No stridor. He has no wheezes. He has no rales. He exhibits no tenderness.  Abdominal: He exhibits no distension. There is no tenderness. There is no rebound.  Musculoskeletal: Normal range of motion. He exhibits no edema.  Lymphadenopathy:    He has no cervical adenopathy.  Neurological:  He is oriented to person, place, and time. He exhibits normal muscle tone. Coordination normal.  Skin: No rash noted. No erythema.  Psychiatric: He has a normal mood and affect. His behavior is normal.    ED Course  Procedures (including critical care time) DIAGNOSTIC STUDIES: Oxygen Saturation is 99% on room air, normal by my interpretation.    COORDINATION OF CARE: 9:45 PM-Discussed treatment plan with pt at bedside and pt agreed to plan.   Labs Review Labs Reviewed - No data to display  Imaging Review No results found.   EKG Interpretation   Date/Time:  Tuesday March 06 2014 20:40:42 EDT Ventricular Rate:  78 PR Interval:  177 QRS Duration: 107 QT Interval:  411 QTC Calculation: 468 R Axis:   11 Text Interpretation:  Sinus rhythm Probable left atrial enlargement LVH  with secondary repolarization abnormality Anterior ST elevation, probably  due to LVH Borderline prolonged QT interval Baseline wander in lead(s) V2  Confirmed by Alejandro Adcox  MD, Yesenia Locurto [phone]) on 03/06/2014 9:45:30 PM      MDM   Final diagnoses:  None    The chart was scribed for me under my direct supervision.  I personally performed the history, physical, and medical decision making and all procedures in the evaluation of this patient.Maudry Diego, MD 03/07/14 [phone]

## 2014-03-06 NOTE — ED Notes (Signed)
Pt c/o left sided chest discomfort that comes and goes. Reports he has been having anxiety and it comes and goes with his stress levels.

## 2014-03-07 NOTE — Discharge Instructions (Signed)
Follow up with your md in 1-2 days for your bp

## 2014-03-28 ENCOUNTER — Emergency Department (HOSPITAL_COMMUNITY)
Admission: EM | Admit: 2014-03-28 | Discharge: 2014-03-28 | Discharge status: Against Medical Advice | Payer: BC Managed Care – PPO | Attending: Emergency Medicine | Admitting: Emergency Medicine

## 2014-03-28 ENCOUNTER — Encounter (HOSPITAL_COMMUNITY): Payer: Self-pay | Admitting: Emergency Medicine

## 2014-03-28 DIAGNOSIS — I129 Hypertensive chronic kidney disease with stage 1 through stage 4 chronic kidney disease, or unspecified chronic kidney disease: Secondary | ICD-10-CM | POA: Diagnosis present

## 2014-03-28 DIAGNOSIS — I5022 Chronic systolic (congestive) heart failure: Secondary | ICD-10-CM | POA: Diagnosis not present

## 2014-03-28 DIAGNOSIS — N182 Chronic kidney disease, stage 2 (mild): Secondary | ICD-10-CM | POA: Diagnosis not present

## 2014-03-28 NOTE — ED Notes (Signed)
Headache, nausea and dizziness x 2 days along with htn.  Reports has been taking htn meds with no relief.

## 2014-03-28 NOTE — ED Notes (Signed)
Called patient for recheck of vital signs. No answer in waiting room.

## 2014-03-29 ENCOUNTER — Encounter (HOSPITAL_COMMUNITY): Payer: Self-pay | Admitting: Emergency Medicine

## 2014-03-29 ENCOUNTER — Emergency Department (HOSPITAL_COMMUNITY)
Admission: EM | Admit: 2014-03-29 | Discharge: 2014-03-29 | Disposition: A | Discharge status: Home / Self Care | Payer: BC Managed Care – PPO | Attending: Emergency Medicine | Admitting: Emergency Medicine

## 2014-03-29 ENCOUNTER — Emergency Department (HOSPITAL_COMMUNITY): Payer: BC Managed Care – PPO

## 2014-03-29 DIAGNOSIS — I619 Nontraumatic intracerebral hemorrhage, unspecified: Secondary | ICD-10-CM | POA: Diagnosis not present

## 2014-03-29 DIAGNOSIS — Z79899 Other long term (current) drug therapy: Secondary | ICD-10-CM | POA: Diagnosis not present

## 2014-03-29 DIAGNOSIS — N182 Chronic kidney disease, stage 2 (mild): Secondary | ICD-10-CM | POA: Insufficient documentation

## 2014-03-29 DIAGNOSIS — R51 Headache: Secondary | ICD-10-CM | POA: Diagnosis present

## 2014-03-29 DIAGNOSIS — R519 Headache, unspecified: Secondary | ICD-10-CM

## 2014-03-29 DIAGNOSIS — Z9119 Patient's noncompliance with other medical treatment and regimen: Secondary | ICD-10-CM | POA: Insufficient documentation

## 2014-03-29 DIAGNOSIS — Z91199 Patient's noncompliance with other medical treatment and regimen due to unspecified reason: Secondary | ICD-10-CM | POA: Diagnosis not present

## 2014-03-29 DIAGNOSIS — R42 Dizziness and giddiness: Secondary | ICD-10-CM | POA: Insufficient documentation

## 2014-03-29 DIAGNOSIS — E876 Hypokalemia: Secondary | ICD-10-CM | POA: Diagnosis not present

## 2014-03-29 DIAGNOSIS — I129 Hypertensive chronic kidney disease with stage 1 through stage 4 chronic kidney disease, or unspecified chronic kidney disease: Secondary | ICD-10-CM | POA: Diagnosis not present

## 2014-03-29 DIAGNOSIS — Z9114 Patient's other noncompliance with medication regimen: Secondary | ICD-10-CM

## 2014-03-29 DIAGNOSIS — I5042 Chronic combined systolic (congestive) and diastolic (congestive) heart failure: Secondary | ICD-10-CM | POA: Diagnosis not present

## 2014-03-29 DIAGNOSIS — Z8701 Personal history of pneumonia (recurrent): Secondary | ICD-10-CM | POA: Insufficient documentation

## 2014-03-29 DIAGNOSIS — I10 Essential (primary) hypertension: Secondary | ICD-10-CM

## 2014-03-29 LAB — BASIC METABOLIC PANEL
Anion gap: 13 (ref 5–15)
BUN: 17 mg/dL (ref 6–23)
CHLORIDE: 97 meq/L (ref 96–112)
CO2: 28 meq/L (ref 19–32)
CREATININE: 1.67 mg/dL — AB (ref 0.50–1.35)
Calcium: 9.2 mg/dL (ref 8.4–10.5)
GFR calc Af Amer: 61 mL/min — ABNORMAL LOW (ref >90)
GFR calc non Af Amer: 53 mL/min — ABNORMAL LOW (ref >90)
GLUCOSE: 121 mg/dL — AB (ref 70–99)
Potassium: 3.1 mEq/L — ABNORMAL LOW (ref 3.7–5.3)
Sodium: 138 mEq/L (ref 137–147)

## 2014-03-29 LAB — CBC WITH DIFFERENTIAL/PLATELET
BASOS ABS: 0 10*3/uL (ref 0.0–0.1)
Basophils Relative: 1 % (ref 0–1)
Eosinophils Absolute: 0.1 10*3/uL (ref 0.0–0.7)
Eosinophils Relative: 3 % (ref 0–5)
HEMATOCRIT: 47.8 % (ref 39.0–52.0)
HEMOGLOBIN: 16.8 g/dL (ref 13.0–17.0)
LYMPHS ABS: 1.9 10*3/uL (ref 0.7–4.0)
LYMPHS PCT: 39 % (ref 12–46)
MCH: 27.9 pg (ref 26.0–34.0)
MCHC: 35.1 g/dL (ref 30.0–36.0)
MCV: 79.3 fL (ref 78.0–100.0)
MONO ABS: 0.3 10*3/uL (ref 0.1–1.0)
Monocytes Relative: 6 % (ref 3–12)
NEUTROS ABS: 2.5 10*3/uL (ref 1.7–7.7)
Neutrophils Relative %: 51 % (ref 43–77)
Platelets: 187 10*3/uL (ref 150–400)
RBC: 6.03 MIL/uL — AB (ref 4.22–5.81)
RDW: 13.6 % (ref 11.5–15.5)
WBC: 4.8 10*3/uL (ref 4.0–10.5)

## 2014-03-29 MED ORDER — DIPHENHYDRAMINE HCL 25 MG PO CAPS
25.0000 mg | ORAL_CAPSULE | Freq: Once | ORAL | Status: AC
  Administered 2014-03-29: 25 mg via ORAL
  Filled 2014-03-29: qty 1

## 2014-03-29 MED ORDER — METOCLOPRAMIDE HCL 10 MG PO TABS: 10.0000 mg | ORAL_TABLET | Freq: Four times a day (QID) | ORAL | Status: DC | PRN

## 2014-03-29 MED ORDER — POTASSIUM CHLORIDE CRYS ER 20 MEQ PO TBCR
40.0000 meq | EXTENDED_RELEASE_TABLET | Freq: Once | ORAL | Status: AC
  Administered 2014-03-29: 40 meq via ORAL
  Filled 2014-03-29: qty 2

## 2014-03-29 MED ORDER — HYDROCODONE-ACETAMINOPHEN 5-325 MG PO TABS
1.0000 | ORAL_TABLET | Freq: Once | ORAL | Status: AC
  Administered 2014-03-29: 1 via ORAL
  Filled 2014-03-29: qty 1

## 2014-03-29 MED ORDER — CLONIDINE HCL 0.2 MG PO TABS
0.2000 mg | ORAL_TABLET | Freq: Once | ORAL | Status: AC
  Administered 2014-03-29: 0.2 mg via ORAL
  Filled 2014-03-29: qty 1

## 2014-03-29 MED ORDER — METOCLOPRAMIDE HCL 5 MG/ML IJ SOLN
10.0000 mg | Freq: Once | INTRAMUSCULAR | Status: AC
  Administered 2014-03-29: 10 mg via INTRAMUSCULAR
  Filled 2014-03-29: qty 2

## 2014-03-29 MED ORDER — CARVEDILOL 25 MG PO TABS: 25.0000 mg | ORAL_TABLET | Freq: Two times a day (BID) | ORAL | Status: DC

## 2014-03-29 MED ORDER — LABETALOL HCL 5 MG/ML IV SOLN
10.0000 mg | Freq: Once | INTRAVENOUS | Status: AC
  Administered 2014-03-29: 10 mg via INTRAVENOUS
  Filled 2014-03-29: qty 4

## 2014-03-29 NOTE — Discharge Instructions (Signed)
Please take your blood pressure medicines as directed. Followup with primary care Dr. to ensure improvement in blood pressure. Return to the ER she develop chest pain, shortness breath or pass out or other concerning symptoms.  If you were given medicines take as directed.  If you are on coumadin or contraceptives realize their levels and effectiveness is altered by many different medicines.  If you have any reaction (rash, tongues swelling, other) to the medicines stop taking and see a physician.   Please follow up as directed and return to the ER or see a physician for new or worsening symptoms.  Thank you. Filed Vitals:   03/29/14 0704 03/29/14 0705 03/29/14 0905  BP: 191/144 184/144 160/122  Pulse: 90  72  Temp: 98.3 F (36.8 C)    Resp: 18  18  Height: 5\' 10"  (1.778 m)    Weight: 212 lb (96.163 kg)    SpO2: 100%  100%

## 2014-03-29 NOTE — ED Notes (Addendum)
Pt co headache and nausea x 2 days, bp elevated in triage, pt does have HX of HTN. Pt has been taking hydralazine but has been without carvedilol "for a few days, getting it refilled today". BP 191/144 in triage.  Pt also on clonidine patch, has been without but refilling RX today as well.

## 2014-03-29 NOTE — ED Provider Notes (Signed)
CSN: [id number]     Arrival date & time 03/29/14  0654 History   First MD Initiated Contact with Patient 03/29/14 [phone]     Chief Complaint  Patient presents with  . Headache     (Consider location/radiation/quality/duration/timing/severity/associated sxs/prior Treatment) HPI Comments: 33 year old male with history of high blood pressure, congestive heart failure systolic and diastolic, nonischemic cardiomyopathy, high blood pressure uncontrolled, noncompliant with medications, nonsmoker, alcohol use presents with headache and high blood pressure. Patient has had gradually worsening headache since Monday, frontal with nausea. Worse with loud noises and mild photophobia. Patient had migraine headaches years ago none recent. No head injury, fevers or neck stiffness. Patient has not taken his carvedilol and over one week and has been out of his clonidine patches.  The history is provided by the patient.    Past Medical History  Diagnosis Date  . History of pneumonia   . Nonischemic cardiomyopathy     Normal coronaries May 2012, LVEF < 20%  . Chronic systolic heart failure [id number]  . CKD (chronic kidney disease) stage 2, GFR 60-89 ml/min   . Essential hypertension, benign   . Noncompliance   . Mitral regurgitation     Moderate   Past Surgical History  Procedure Laterality Date  . Surgery scrotal / testicular      Testicular torsion   Family History  Problem Relation Age of Onset  . Breast cancer Mother   . Stroke Father    History  Substance Use Topics  . Smoking status: Never Smoker   . Smokeless tobacco: Not on file  . Alcohol Use: Yes     Comment: occasionally    Review of Systems  Constitutional: Positive for appetite change. Negative for fever and chills.  HENT: Negative for congestion.   Eyes: Positive for photophobia. Negative for visual disturbance.  Respiratory: Negative for shortness of breath.   Cardiovascular: Negative for chest pain.  Gastrointestinal:  Positive for nausea. Negative for vomiting and abdominal pain.  Genitourinary: Negative for dysuria and flank pain.  Musculoskeletal: Negative for back pain, gait problem, neck pain and neck stiffness.  Skin: Negative for rash.  Neurological: Positive for light-headedness and headaches. Negative for weakness and numbness.      Allergies  Review of patient's allergies indicates no known allergies.  Home Medications   Prior to Admission medications   Medication Sig Start Date End Date Taking? Authorizing Provider  carvedilol (COREG) 25 MG tablet Take 25 mg by mouth 2 (two) times daily with a meal.    Historical Provider, MD  furosemide (LASIX) 40 MG tablet Take 1 tablet (40 mg total) by mouth daily. 08/17/13   Jolaine Artist, MD  hydrALAZINE (APRESOLINE) 100 MG tablet Take 1 tablet (100 mg total) by mouth 2 (two) times daily. 08/10/13   Amy D Clegg, NP   BP 184/144  Pulse 90  Temp(Src) 98.3 F (36.8 C)  Resp 18  Ht 5\' 10"  (1.778 m)  Wt 212 lb (96.163 kg)  BMI 30.42 kg/m2  SpO2 100% Physical Exam  Nursing note and vitals reviewed. Constitutional: He is oriented to person, place, and time. He appears well-developed and well-nourished.  HENT:  Head: Normocephalic and atraumatic.  Eyes: Conjunctivae are normal. Right eye exhibits no discharge. Left eye exhibits no discharge.  Neck: Normal range of motion. Neck supple. No tracheal deviation present.  Cardiovascular: Normal rate and regular rhythm.   Pulmonary/Chest: Effort normal and breath sounds normal.  Abdominal: Soft. He exhibits no distension. There is  no tenderness. There is no guarding.  Musculoskeletal: He exhibits no edema.  Neurological: He is alert and oriented to person, place, and time. Coordination and gait normal. GCS eye subscore is 4. GCS verbal subscore is 5. GCS motor subscore is 6.  5+ strength in UE and LE with f/e at major joints. Sensation to palpation intact in UE and LE. CNs 2-12 grossly intact.  EOMFI.   PERRL.   Finger nose and coordination intact bilateral.   Visual fields intact to finger testing.   Skin: Skin is warm. No rash noted.  Psychiatric: He has a normal mood and affect.    ED Course  Procedures (including critical care time) Labs Review Labs Reviewed  CBC WITH DIFFERENTIAL - Abnormal; Notable for the following:    RBC 6.03 (*)    All other components within normal limits  BASIC METABOLIC PANEL - Abnormal; Notable for the following:    Potassium 3.1 (*)    Glucose, Bld 121 (*)    Creatinine, Ser 1.67 (*)    GFR calc non Af Amer 53 (*)    GFR calc Af Amer 61 (*)    All other components within normal limits    Imaging Review Ct Head Wo Contrast  03/29/2014   CLINICAL DATA:  Headache, nausea, dizziness  EXAM: CT HEAD WITHOUT CONTRAST  TECHNIQUE: Contiguous axial images were obtained from the base of the skull through the vertex without intravenous contrast.  COMPARISON:  None.  FINDINGS: No skull fracture is noted. Paranasal sinuses and mastoid air cells are unremarkable. No hydrocephalus. The gray and white-matter differentiation is preserved. No acute cortical infarction. No mass lesion is noted on this unenhanced scan. No intra or extra-axial fluid collection.  IMPRESSION: No acute intracranial abnormality.   Electronically Signed   By: Lahoma Crocker M.D.   On: 03/29/2014 08:42     EKG Interpretation None      MDM   Final diagnoses:  Essential hypertension  Chronic combined systolic and diastolic heart failure  Headache, unspecified headache type  Noncompliance with medication regimen  Hypokalemia   Patient presents with headache and uncontrolled blood pressure, patient denies current chest pain, shortness of breath, syncope and had gradual onset headache. Headache is likely a combination of history of headaches and uncontrolled blood pressure from noncompliance. I long discussion with patient in terms of importance of blood pressure management and close outpatient  followup with long-term risks of heart attack, stroke, other organ damage. Plan for initial oral blood pressure medicine, headache cocktail. CT head to rule out signs of bleeding from significant blood pressure and since headache is overall atypical since he has not had it for years, gradual onset this does not present like a subarachnoid hemorrhage. Basic blood work look for signs of endorgan damage an IV placed in case patient needs IV blood pressure medications. Patient rechecked multiple times, headache improved, no chest pain or shortness of breath. Patient's lung exam is clear. Blood pressure did improve with pain control and blood pressure medications. Patient says he will start taking his blood pressure meds regularly and will followup outpatient closely. I do not want to aggressively drop his blood pressure too quickly as his blood pressure probably has been chronically elevated.  Results and differential diagnosis were discussed with the patient/parent/guardian. Close follow up outpatient was discussed, comfortable with the plan.   Medications  cloNIDine (CATAPRES) tablet 0.2 mg (0.2 mg Oral Given 03/29/14 0735)  metoCLOPramide (REGLAN) injection 10 mg (10 mg Intramuscular Given  03/29/14 0735)  diphenhydrAMINE (BENADRYL) capsule 25 mg (25 mg Oral Given 03/29/14 0735)  HYDROcodone-acetaminophen (NORCO/VICODIN) 5-325 MG per tablet 1 tablet (1 tablet Oral Given 03/29/14 0735)  labetalol (NORMODYNE,TRANDATE) injection 10 mg (10 mg Intravenous Given 03/29/14 0958)  potassium chloride SA (K-DUR,KLOR-CON) CR tablet 40 mEq (40 mEq Oral Given 03/29/14 0958)    Filed Vitals:   03/29/14 0704 03/29/14 0705 03/29/14 0905  BP: 191/144 184/144 160/122  Pulse: 90  72  Temp: 98.3 F (36.8 C)    Resp: 18  18  Height: 5\' 10"  (1.778 m)    Weight: 212 lb (96.163 kg)    SpO2: 100%  100%    Final diagnoses:  Essential hypertension  Chronic combined systolic and diastolic heart failure  Headache,  unspecified headache type  Noncompliance with medication regimen  Hypokalemia        Mariea Clonts, MD 03/29/14 1041

## 2014-04-02 ENCOUNTER — Emergency Department (HOSPITAL_COMMUNITY): Payer: BC Managed Care – PPO

## 2014-04-02 ENCOUNTER — Encounter (HOSPITAL_COMMUNITY): Payer: Self-pay | Admitting: Emergency Medicine

## 2014-04-02 ENCOUNTER — Emergency Department (HOSPITAL_COMMUNITY)
Admission: EM | Admit: 2014-04-02 | Discharge: 2014-04-02 | Disposition: A | Discharge status: Home / Self Care | Payer: BC Managed Care – PPO | Attending: Emergency Medicine | Admitting: Emergency Medicine

## 2014-04-02 DIAGNOSIS — R079 Chest pain, unspecified: Secondary | ICD-10-CM | POA: Diagnosis not present

## 2014-04-02 DIAGNOSIS — E876 Hypokalemia: Secondary | ICD-10-CM | POA: Diagnosis not present

## 2014-04-02 DIAGNOSIS — I5022 Chronic systolic (congestive) heart failure: Secondary | ICD-10-CM | POA: Insufficient documentation

## 2014-04-02 DIAGNOSIS — N182 Chronic kidney disease, stage 2 (mild): Secondary | ICD-10-CM | POA: Diagnosis not present

## 2014-04-02 DIAGNOSIS — Z8701 Personal history of pneumonia (recurrent): Secondary | ICD-10-CM | POA: Diagnosis not present

## 2014-04-02 DIAGNOSIS — I129 Hypertensive chronic kidney disease with stage 1 through stage 4 chronic kidney disease, or unspecified chronic kidney disease: Secondary | ICD-10-CM | POA: Diagnosis not present

## 2014-04-02 DIAGNOSIS — Z79899 Other long term (current) drug therapy: Secondary | ICD-10-CM | POA: Insufficient documentation

## 2014-04-02 DIAGNOSIS — N289 Disorder of kidney and ureter, unspecified: Secondary | ICD-10-CM

## 2014-04-02 DIAGNOSIS — R0602 Shortness of breath: Secondary | ICD-10-CM | POA: Diagnosis present

## 2014-04-02 LAB — CBC WITH DIFFERENTIAL/PLATELET
Basophils Absolute: 0 10*3/uL (ref 0.0–0.1)
Basophils Relative: 1 % (ref 0–1)
Eosinophils Absolute: 0.2 10*3/uL (ref 0.0–0.7)
Eosinophils Relative: 3 % (ref 0–5)
HEMATOCRIT: 44.3 % (ref 39.0–52.0)
HEMOGLOBIN: 15.4 g/dL (ref 13.0–17.0)
LYMPHS ABS: 1.9 10*3/uL (ref 0.7–4.0)
Lymphocytes Relative: 32 % (ref 12–46)
MCH: 27.7 pg (ref 26.0–34.0)
MCHC: 34.8 g/dL (ref 30.0–36.0)
MCV: 79.7 fL (ref 78.0–100.0)
MONOS PCT: 4 % (ref 3–12)
Monocytes Absolute: 0.3 10*3/uL (ref 0.1–1.0)
NEUTROS ABS: 3.7 10*3/uL (ref 1.7–7.7)
Neutrophils Relative %: 60 % (ref 43–77)
Platelets: 195 10*3/uL (ref 150–400)
RBC: 5.56 MIL/uL (ref 4.22–5.81)
RDW: 13.6 % (ref 11.5–15.5)
WBC: 6.1 10*3/uL (ref 4.0–10.5)

## 2014-04-02 LAB — I-STAT TROPONIN, ED: Troponin i, poc: 0.1 ng/mL (ref 0.00–0.08)

## 2014-04-02 LAB — BASIC METABOLIC PANEL
ANION GAP: 13 (ref 5–15)
BUN: 23 mg/dL (ref 6–23)
CHLORIDE: 103 meq/L (ref 96–112)
CO2: 23 meq/L (ref 19–32)
Calcium: 8.7 mg/dL (ref 8.4–10.5)
Creatinine, Ser: 1.77 mg/dL — ABNORMAL HIGH (ref 0.50–1.35)
GFR calc Af Amer: 57 mL/min — ABNORMAL LOW (ref >90)
GFR calc non Af Amer: 49 mL/min — ABNORMAL LOW (ref >90)
Glucose, Bld: 111 mg/dL — ABNORMAL HIGH (ref 70–99)
Potassium: 3.4 mEq/L — ABNORMAL LOW (ref 3.7–5.3)
SODIUM: 139 meq/L (ref 137–147)

## 2014-04-02 LAB — PRO B NATRIURETIC PEPTIDE: PRO B NATRI PEPTIDE: 1570 pg/mL — AB (ref 0–125)

## 2014-04-02 LAB — TROPONIN I

## 2014-04-02 MED ORDER — FUROSEMIDE 10 MG/ML IJ SOLN
40.0000 mg | Freq: Once | INTRAMUSCULAR | Status: AC
  Administered 2014-04-02: 40 mg via INTRAVENOUS
  Filled 2014-04-02: qty 4

## 2014-04-02 MED ORDER — POTASSIUM CHLORIDE CRYS ER 20 MEQ PO TBCR
40.0000 meq | EXTENDED_RELEASE_TABLET | Freq: Once | ORAL | Status: AC
  Administered 2014-04-02: 40 meq via ORAL
  Filled 2014-04-02: qty 2

## 2014-04-02 NOTE — ED Notes (Signed)
Patient ambulated with one staff assist, tolerated well. O2 stayed 94%-97%

## 2014-04-02 NOTE — ED Notes (Signed)
Pt c/o of tightness to left chest x 1 day; pt c/o sob

## 2014-04-02 NOTE — ED Provider Notes (Signed)
CSN: [id number]     Arrival date & time 04/02/14  0245 History   First MD Initiated Contact with Patient 04/02/14 0259     Chief Complaint  Patient presents with  . Shortness of Breath  . Chest Pain      Patient is a 33 y.o. male presenting with shortness of breath and chest pain. The history is provided by the patient.  Shortness of Breath Severity:  Moderate Onset quality:  Gradual Duration:  1 day Timing:  Intermittent Progression:  Worsening Chronicity:  Recurrent Relieved by:  Rest Worsened by:  Activity (lying supine) Associated symptoms: chest pain and cough   Associated symptoms: no fever, no hemoptysis, no syncope and no vomiting   Chest Pain Associated symptoms: cough and shortness of breath   Associated symptoms: no fever, no syncope and not vomiting   Pt report feeling fatigued and SOB for past day It is improved with.  It is worse with lying flat  He also reports mild left sided chest tightness that is improving.  He denies pleuritic CP The CP does not radiate     Past Medical History  Diagnosis Date  . History of pneumonia   . Nonischemic cardiomyopathy     Normal coronaries May 2012, LVEF < 20%  . Chronic systolic heart failure [id number]  . CKD (chronic kidney disease) stage 2, GFR 60-89 ml/min   . Essential hypertension, benign   . Noncompliance   . Mitral regurgitation     Moderate   Past Surgical History  Procedure Laterality Date  . Surgery scrotal / testicular      Testicular torsion   Family History  Problem Relation Age of Onset  . Breast cancer Mother   . Stroke Father    History  Substance Use Topics  . Smoking status: Never Smoker   . Smokeless tobacco: Not on file  . Alcohol Use: Yes     Comment: occasionally    Review of Systems  Constitutional: Negative for fever.  Respiratory: Positive for cough and shortness of breath. Negative for hemoptysis.   Cardiovascular: Positive for chest pain. Negative for syncope.   Gastrointestinal: Negative for vomiting.  Neurological: Negative for syncope.  All other systems reviewed and are negative.     Allergies  Review of patient's allergies indicates no known allergies.  Home Medications   Prior to Admission medications   Medication Sig Start Date End Date Taking? Authorizing Provider  carvedilol (COREG) 25 MG tablet Take 1 tablet (25 mg total) by mouth 2 (two) times daily with a meal. 03/29/14   Mariea Clonts, MD  furosemide (LASIX) 40 MG tablet Take 1 tablet (40 mg total) by mouth daily. 08/17/13   Jolaine Artist, MD  hydrALAZINE (APRESOLINE) 100 MG tablet Take 1 tablet (100 mg total) by mouth 2 (two) times daily. 08/10/13   Amy D Ninfa Meeker, NP  metoCLOPramide (REGLAN) 10 MG tablet Take 1 tablet (10 mg total) by mouth every 6 (six) hours as needed for nausea (nausea/headache, take with benadryl). 03/29/14   Mariea Clonts, MD   BP 149/100  Pulse 92  Temp(Src) 97.7 F (36.5 C) (Oral)  Resp 20  Ht 5\' 10"  (1.778 m)  Wt 212 lb (96.163 kg)  BMI 30.42 kg/m2  SpO2 100% Physical Exam CONSTITUTIONAL: Well developed/well nourished HEAD: Normocephalic/atraumatic EYES: EOMI/PERRL ENMT: Mucous membranes moist NECK: supple no meningeal signs, no JVD noted SPINE:entire spine nontender CV: S1/S2 noted, no murmurs/rubs/gallops noted LUNGS: scattered crackles bilaterally no apparent distress  ABDOMEN: soft, nontender, no rebound or guarding GU:no cva tenderness NEURO: Pt is awake/alert, moves all extremitiesx4 EXTREMITIES: pulses normal, full ROM, no LE edema noted SKIN: warm, color normal PSYCH: no abnormalities of mood noted  ED Course  Procedures  3:44 AM Pt with h/o non-ischemic Cardiomyopathy (cardiac cath 2012 with normal coronaries) who presents with CP/SOB.  He admits his main complaint is fatigue/SOB.   Labs pending at this time 4:20 AM Lab troponin negative CXR does not indicate significant volume overload Pt reports he has not seen  cardiology recently, reports he is waiting on a phone call to get back into to see them 5:03 AM Pt ambulatory without distress He denies weakness/dizziness/dyspnea on ambulation He is well appearing and in no distress He denies any active CP at this time One pulse ox of 90% recorded but pt had been resting in bed and not taking deep breaths, this easily corrects to >95%  I doubt ACS (had normal cardiac cath previously) and also I doubt PE (no pleuritic pain) He does have abnormal lung findings, but CXR is not remarkably changed from prior He denies recent wt gain (baseline wt around 210-212 lbs.) After further discussion, will give extra dose of lasix here and will continue all of his home meds He has not seen outpatient cardiology in >6 months, will give referral to followup He feels comfortable for discharge home He does not want to miss any work and will try to work later today I discussed need for followup as outpatient  Labs Review Labs Reviewed  BASIC METABOLIC PANEL - Abnormal; Notable for the following:    Potassium 3.4 (*)    Glucose, Bld 111 (*)    Creatinine, Ser 1.77 (*)    GFR calc non Af Amer 49 (*)    GFR calc Af Amer 57 (*)    All other components within normal limits  PRO B NATRIURETIC PEPTIDE - Abnormal; Notable for the following:    Pro B Natriuretic peptide (BNP) 1570.0 (*)    All other components within normal limits  I-STAT TROPOININ, ED - Abnormal; Notable for the following:    Troponin i, poc 0.10 (*)    All other components within normal limits  CBC WITH DIFFERENTIAL  TROPONIN I    Imaging Review Dg Chest Portable 1 View  04/02/2014   CLINICAL DATA:  Shortness of breath and chest pain. Initial encounter.  EXAM: PORTABLE CHEST - 1 VIEW  COMPARISON:  03/06/2014  FINDINGS: Cardiomegaly which is similar to previous. Aortic contours are stable.  There is no edema, consolidation, effusion, or pneumothorax.  IMPRESSION: Cardiomegaly without failure.    Electronically Signed   By: Jorje Guild M.D.   On: 04/02/2014 03:14     EKG Interpretation   Date/Time:  Monday April 02 2014 02:58:09 EDT Ventricular Rate:  84 PR Interval:  167 QRS Duration: 106 QT Interval:  413 QTC Calculation: 488 R Axis:   5 Text Interpretation:  Sinus rhythm Probable left atrial enlargement LVH  with secondary repolarization abnormality Prolonged QT interval No  significant change since last tracing Confirmed by Christy Gentles  MD, Elenore Rota  [phone]) on 04/02/2014 3:00:20 AM      MDM   Final diagnoses:  Hypokalemia  Renal insufficiency  Chronic systolic heart failure    Nursing notes including past medical history and social history reviewed and considered in documentation xrays reviewed and considered Labs/vital reviewed and considered Previous records reviewed and considered     Sharyon Cable,  MD 04/02/14 [id number]

## 2014-05-31 ENCOUNTER — Telehealth: Payer: Self-pay | Admitting: Internal Medicine

## 2014-05-31 NOTE — Telephone Encounter (Signed)
Received records from [facility] for appointment on 07/19/14 with Dr Debara Pickett.  Records given to [facility] (medical records) for Dr Lysbeth Penner schedule on 07/19/14. lp

## 2014-07-19 ENCOUNTER — Ambulatory Visit: Payer: BC Managed Care – PPO | Admitting: Internal Medicine

## 2014-07-31 ENCOUNTER — Emergency Department (HOSPITAL_COMMUNITY)
Admission: EM | Admit: 2014-07-31 | Discharge: 2014-07-31 | Disposition: A | Discharge status: Home / Self Care | Payer: BLUE CROSS/BLUE SHIELD | Attending: Emergency Medicine | Admitting: Emergency Medicine

## 2014-07-31 ENCOUNTER — Encounter (HOSPITAL_COMMUNITY): Payer: Self-pay | Admitting: Emergency Medicine

## 2014-07-31 DIAGNOSIS — N182 Chronic kidney disease, stage 2 (mild): Secondary | ICD-10-CM | POA: Diagnosis not present

## 2014-07-31 DIAGNOSIS — I10 Essential (primary) hypertension: Secondary | ICD-10-CM

## 2014-07-31 DIAGNOSIS — R51 Headache: Secondary | ICD-10-CM | POA: Insufficient documentation

## 2014-07-31 DIAGNOSIS — Z8701 Personal history of pneumonia (recurrent): Secondary | ICD-10-CM | POA: Insufficient documentation

## 2014-07-31 DIAGNOSIS — Z79899 Other long term (current) drug therapy: Secondary | ICD-10-CM | POA: Insufficient documentation

## 2014-07-31 DIAGNOSIS — I5022 Chronic systolic (congestive) heart failure: Secondary | ICD-10-CM | POA: Diagnosis not present

## 2014-07-31 DIAGNOSIS — I129 Hypertensive chronic kidney disease with stage 1 through stage 4 chronic kidney disease, or unspecified chronic kidney disease: Secondary | ICD-10-CM | POA: Insufficient documentation

## 2014-07-31 DIAGNOSIS — R519 Headache, unspecified: Secondary | ICD-10-CM

## 2014-07-31 LAB — CBC WITH DIFFERENTIAL/PLATELET
BASOS PCT: 1 % (ref 0–1)
Basophils Absolute: 0 10*3/uL (ref 0.0–0.1)
Eosinophils Absolute: 0.2 10*3/uL (ref 0.0–0.7)
Eosinophils Relative: 2 % (ref 0–5)
HCT: 44.7 % (ref 39.0–52.0)
HEMOGLOBIN: 14.9 g/dL (ref 13.0–17.0)
LYMPHS PCT: 37 % (ref 12–46)
Lymphs Abs: 2.5 10*3/uL (ref 0.7–4.0)
MCH: 27.2 pg (ref 26.0–34.0)
MCHC: 33.3 g/dL (ref 30.0–36.0)
MCV: 81.6 fL (ref 78.0–100.0)
MONOS PCT: 7 % (ref 3–12)
Monocytes Absolute: 0.5 10*3/uL (ref 0.1–1.0)
NEUTROS ABS: 3.6 10*3/uL (ref 1.7–7.7)
Neutrophils Relative %: 53 % (ref 43–77)
PLATELETS: 220 10*3/uL (ref 150–400)
RBC: 5.48 MIL/uL (ref 4.22–5.81)
RDW: 13.9 % (ref 11.5–15.5)
WBC: 6.8 10*3/uL (ref 4.0–10.5)

## 2014-07-31 LAB — BASIC METABOLIC PANEL
Anion gap: 7 (ref 5–15)
BUN: 26 mg/dL — AB (ref 6–23)
CALCIUM: 9.1 mg/dL (ref 8.4–10.5)
CO2: 27 mmol/L (ref 19–32)
Chloride: 103 mEq/L (ref 96–112)
Creatinine, Ser: 1.67 mg/dL — ABNORMAL HIGH (ref 0.50–1.35)
GFR calc Af Amer: 61 mL/min — ABNORMAL LOW (ref >90)
GFR, EST NON AFRICAN AMERICAN: 52 mL/min — AB (ref >90)
Glucose, Bld: 96 mg/dL (ref 70–99)
POTASSIUM: 3.4 mmol/L — AB (ref 3.5–5.1)
SODIUM: 137 mmol/L (ref 135–145)

## 2014-07-31 MED ORDER — DIPHENHYDRAMINE HCL 50 MG/ML IJ SOLN
25.0000 mg | Freq: Once | INTRAMUSCULAR | Status: AC
  Administered 2014-07-31: 25 mg via INTRAVENOUS
  Filled 2014-07-31: qty 1

## 2014-07-31 MED ORDER — KETOROLAC TROMETHAMINE 30 MG/ML IJ SOLN
30.0000 mg | Freq: Once | INTRAMUSCULAR | Status: AC
  Administered 2014-07-31: 30 mg via INTRAVENOUS
  Filled 2014-07-31: qty 1

## 2014-07-31 MED ORDER — METOCLOPRAMIDE HCL 5 MG/ML IJ SOLN
10.0000 mg | Freq: Once | INTRAMUSCULAR | Status: AC
  Administered 2014-07-31: 10 mg via INTRAVENOUS
  Filled 2014-07-31: qty 2

## 2014-07-31 MED ORDER — SODIUM CHLORIDE 0.9 % IV BOLUS (SEPSIS)
1000.0000 mL | Freq: Once | INTRAVENOUS | Status: AC
  Administered 2014-07-31: 1000 mL via INTRAVENOUS

## 2014-07-31 NOTE — ED Provider Notes (Signed)
CSN: [id number]     Arrival date & time 07/31/14  1447 History   First MD Initiated Contact with Patient 07/31/14 1528     Chief Complaint  Patient presents with  . Migraine  . Loss of Consciousness     (Consider location/radiation/quality/duration/timing/severity/associated sxs/prior Treatment) HPI.... Migraine headache today in the right parietal temporal area. Patient has a history of same. Saturday patient said he "passed out" while urinating. Wife reports that his blood pressure is never under good control. He normally takes [place] powders and ibuprofen for his headache. No cigarette smoking and rare alcohol. No neuro deficits, stiff neck, fever, chills, chest pain, dyspnea, dysuria.  Past Medical History  Diagnosis Date  . History of pneumonia   . Nonischemic cardiomyopathy     Normal coronaries May 2012, LVEF < 20%  . Chronic systolic heart failure [id number]  . CKD (chronic kidney disease) stage 2, GFR 60-89 ml/min   . Essential hypertension, benign   . Noncompliance   . Mitral regurgitation     Moderate   Past Surgical History  Procedure Laterality Date  . Surgery scrotal / testicular      Testicular torsion   Family History  Problem Relation Age of Onset  . Breast cancer Mother   . Stroke Father    History  Substance Use Topics  . Smoking status: Never Smoker   . Smokeless tobacco: Never Used  . Alcohol Use: Yes     Comment: occasionally    Review of Systems  All other systems reviewed and are negative.     Allergies  Review of patient's allergies indicates no known allergies.  Home Medications   Prior to Admission medications   Medication Sig Start Date End Date Taking? Authorizing Provider  Aspirin-Acetaminophen-Caffeine (GOODY HEADACHE PO) Take 1 packet by mouth daily as needed (for pain).   Yes Historical Provider, MD  carvedilol (COREG) 25 MG tablet Take 1 tablet (25 mg total) by mouth 2 (two) times daily with a meal. 03/29/14  Yes Mariea Clonts,  MD  cloNIDine (CATAPRES - DOSED IN MG/24 HR) 0.1 mg/24hr patch Place 0.1 mg onto the skin once a week.   Yes Historical Provider, MD  furosemide (LASIX) 40 MG tablet Take 1 tablet (40 mg total) by mouth daily. 08/17/13  Yes Jolaine Artist, MD  hydrALAZINE (APRESOLINE) 100 MG tablet Take 1 tablet (100 mg total) by mouth 2 (two) times daily. Patient taking differently: Take 50 mg by mouth 3 (three) times daily.  08/10/13  Yes Amy D Clegg, NP  metoCLOPramide (REGLAN) 10 MG tablet Take 1 tablet (10 mg total) by mouth every 6 (six) hours as needed for nausea (nausea/headache, take with benadryl). Patient not taking: Reported on 07/31/2014 03/29/14   Mariea Clonts, MD   BP 176/119 mmHg  Pulse 78  Temp(Src) 97.6 F (36.4 C) (Oral)  Resp 24  Ht 5' 9.5" (1.765 m)  Wt 210 lb (95.255 kg)  BMI 30.58 kg/m2  SpO2 93% Physical Exam  Constitutional: He is oriented to person, place, and time.  Hypertensive  HENT:  Head: Normocephalic and atraumatic.  Eyes: Conjunctivae and EOM are normal. Pupils are equal, round, and reactive to light.  Neck: Normal range of motion. Neck supple.  Cardiovascular: Normal rate and regular rhythm.   Pulmonary/Chest: Effort normal and breath sounds normal.  Abdominal: Soft. Bowel sounds are normal.  Musculoskeletal: Normal range of motion.  Neurological: He is alert and oriented to person, place, and time.  Skin: Skin  is warm and dry.  Psychiatric: He has a normal mood and affect. His behavior is normal.  Nursing note and vitals reviewed.   ED Course  Procedures (including critical care time) Labs Review Labs Reviewed  BASIC METABOLIC PANEL - Abnormal; Notable for the following:    Potassium 3.4 (*)    BUN 26 (*)    Creatinine, Ser 1.67 (*)    GFR calc non Af Amer 52 (*)    GFR calc Af Amer 61 (*)    All other components within normal limits  CBC WITH DIFFERENTIAL    Imaging Review No results found.   EKG Interpretation   Date/Time:  Tuesday July 31 2014 15:19:15 EST Ventricular Rate:  84 PR Interval:  170 QRS Duration: 109 QT Interval:  413 QTC Calculation: 488 R Axis:   1 Text Interpretation:  Sinus rhythm Probable left atrial enlargement  Incomplete left bundle branch block LVH with secondary repolarization  abnormality Prolonged QT interval Confirmed by Lacinda Axon  MD, Keera Altidor ([id number]) on  07/31/2014 3:54:46 PM      MDM   Final diagnoses:  Nonintractable headache, unspecified chronicity pattern, unspecified headache type  Essential hypertension    Patient feels better after IV fluids, IV Toradol, IV Benadryl, IV Reglan. Discussed high blood pressure. Will follow-up for same. No neuro deficits.    Nat Christen, MD 07/31/14 2012

## 2014-07-31 NOTE — ED Notes (Signed)
Patient c/o intermittent migraines for past 3-4 days with nausea. Per patient had syncopal episode lasting no more then a minute on Saturday. Patient reports after syncopal episode dizziness and blurred vision. Denies any dizziness or blurred vision at this time. Per patient has had migraines in past but never had a syncopal episode before Saturday.

## 2014-07-31 NOTE — Discharge Instructions (Signed)
You must see the cardiologist to get your blood pressure control. Uncontrolled blood pressure will lead to heart attack, stroke, kidney disease

## 2014-09-25 ENCOUNTER — Inpatient Hospital Stay (HOSPITAL_COMMUNITY)
Admission: EM | Admit: 2014-09-25 | Discharge: 2014-09-27 | DRG: 682 | Disposition: A | Discharge status: Home / Self Care | Payer: BLUE CROSS/BLUE SHIELD | Source: Other Acute Inpatient Hospital | Attending: Internal Medicine | Admitting: Internal Medicine

## 2014-09-25 ENCOUNTER — Emergency Department (HOSPITAL_COMMUNITY): Payer: BLUE CROSS/BLUE SHIELD

## 2014-09-25 ENCOUNTER — Emergency Department (HOSPITAL_COMMUNITY)
Admission: EM | Admit: 2014-09-25 | Discharge: 2014-09-25 | Discharge status: Against Medical Advice | Payer: BLUE CROSS/BLUE SHIELD | Source: Home / Self Care

## 2014-09-25 ENCOUNTER — Encounter (HOSPITAL_COMMUNITY): Payer: Self-pay | Admitting: Emergency Medicine

## 2014-09-25 DIAGNOSIS — I5043 Acute on chronic combined systolic (congestive) and diastolic (congestive) heart failure: Secondary | ICD-10-CM | POA: Diagnosis present

## 2014-09-25 DIAGNOSIS — E876 Hypokalemia: Secondary | ICD-10-CM | POA: Diagnosis present

## 2014-09-25 DIAGNOSIS — R778 Other specified abnormalities of plasma proteins: Secondary | ICD-10-CM

## 2014-09-25 DIAGNOSIS — I10 Essential (primary) hypertension: Secondary | ICD-10-CM | POA: Diagnosis not present

## 2014-09-25 DIAGNOSIS — I429 Cardiomyopathy, unspecified: Secondary | ICD-10-CM | POA: Diagnosis present

## 2014-09-25 DIAGNOSIS — I509 Heart failure, unspecified: Secondary | ICD-10-CM

## 2014-09-25 DIAGNOSIS — R079 Chest pain, unspecified: Secondary | ICD-10-CM | POA: Diagnosis present

## 2014-09-25 DIAGNOSIS — Z79899 Other long term (current) drug therapy: Secondary | ICD-10-CM

## 2014-09-25 DIAGNOSIS — I169 Hypertensive crisis, unspecified: Secondary | ICD-10-CM

## 2014-09-25 DIAGNOSIS — T465X6A Underdosing of other antihypertensive drugs, initial encounter: Secondary | ICD-10-CM | POA: Diagnosis present

## 2014-09-25 DIAGNOSIS — R7989 Other specified abnormal findings of blood chemistry: Secondary | ICD-10-CM

## 2014-09-25 DIAGNOSIS — I340 Nonrheumatic mitral (valve) insufficiency: Secondary | ICD-10-CM | POA: Diagnosis present

## 2014-09-25 DIAGNOSIS — N183 Chronic kidney disease, stage 3 unspecified: Secondary | ICD-10-CM | POA: Diagnosis present

## 2014-09-25 DIAGNOSIS — I129 Hypertensive chronic kidney disease with stage 1 through stage 4 chronic kidney disease, or unspecified chronic kidney disease: Secondary | ICD-10-CM | POA: Insufficient documentation

## 2014-09-25 DIAGNOSIS — I5023 Acute on chronic systolic (congestive) heart failure: Secondary | ICD-10-CM

## 2014-09-25 DIAGNOSIS — N182 Chronic kidney disease, stage 2 (mild): Secondary | ICD-10-CM | POA: Insufficient documentation

## 2014-09-25 DIAGNOSIS — I160 Hypertensive urgency: Secondary | ICD-10-CM

## 2014-09-25 DIAGNOSIS — Z7982 Long term (current) use of aspirin: Secondary | ICD-10-CM

## 2014-09-25 DIAGNOSIS — Z9114 Patient's other noncompliance with medication regimen: Secondary | ICD-10-CM | POA: Diagnosis present

## 2014-09-25 HISTORY — DX: Heart failure, unspecified: I50.9

## 2014-09-25 LAB — BASIC METABOLIC PANEL
Anion gap: 8 (ref 5–15)
BUN: 27 mg/dL — ABNORMAL HIGH (ref 6–23)
CALCIUM: 9.2 mg/dL (ref 8.4–10.5)
CHLORIDE: 101 mmol/L (ref 96–112)
CO2: 28 mmol/L (ref 19–32)
Creatinine, Ser: 2.1 mg/dL — ABNORMAL HIGH (ref 0.50–1.35)
GFR calc Af Amer: 46 mL/min — ABNORMAL LOW (ref >90)
GFR calc non Af Amer: 40 mL/min — ABNORMAL LOW (ref >90)
Glucose, Bld: 153 mg/dL — ABNORMAL HIGH (ref 70–99)
POTASSIUM: 3.5 mmol/L (ref 3.5–5.1)
Sodium: 137 mmol/L (ref 135–145)

## 2014-09-25 LAB — CBC
HCT: 39.3 % (ref 39.0–52.0)
HEMOGLOBIN: 12.9 g/dL — AB (ref 13.0–17.0)
MCH: 26.5 pg (ref 26.0–34.0)
MCHC: 32.8 g/dL (ref 30.0–36.0)
MCV: 80.9 fL (ref 78.0–100.0)
Platelets: 209 10*3/uL (ref 150–400)
RBC: 4.86 MIL/uL (ref 4.22–5.81)
RDW: 14.2 % (ref 11.5–15.5)
WBC: 7.5 10*3/uL (ref 4.0–10.5)

## 2014-09-25 LAB — TROPONIN I: Troponin I: 0.2 ng/mL — ABNORMAL HIGH (ref <0.031)

## 2014-09-25 MED ORDER — NITROGLYCERIN IN D5W 200-5 MCG/ML-% IV SOLN
2.0000 ug/min | INTRAVENOUS | Status: DC
  Administered 2014-09-25: 5 ug/min via INTRAVENOUS
  Filled 2014-09-25: qty 250

## 2014-09-25 MED ORDER — FUROSEMIDE 10 MG/ML IJ SOLN
40.0000 mg | Freq: Once | INTRAMUSCULAR | Status: AC
  Administered 2014-09-25: 40 mg via INTRAVENOUS
  Filled 2014-09-25: qty 4

## 2014-09-25 NOTE — ED Notes (Signed)
Called patient back for room assignment (third attempt) unable to locate patient.

## 2014-09-25 NOTE — ED Notes (Signed)
Pt sent from [facility] hospital to [facility] ED for suspicious EKG and elevated cardiac markers. Pt went to [facility] this am and left without treatment and went home. Pt states he felt worse later today and went to [facility] ED where he was told he was having a STEMI. Pt was then transferred to [facility] ED. Pt arrived on heparin drip after receiving 4000u heparin bolus and 324 aspirin at [facility]. Pt a/o x 4 on arrival to [facility] and denies any chest pain/SOB.

## 2014-09-25 NOTE — ED Notes (Signed)
Attempted to call pt on his mobile phone d/t abnormal labs and his voicemail is not set up.

## 2014-09-25 NOTE — ED Notes (Signed)
Called patient back for room assignment second attempt, unable to locate patient.

## 2014-09-25 NOTE — ED Provider Notes (Signed)
CSN: [id number]     Arrival date & time 09/25/14  2301 History  This chart was scribed for Linton Flemings, MD by Rayfield Citizen, ED Scribe. This patient was seen in room A08C/A08C and the patient's care was started at 11:16 PM.    Chief Complaint  Patient presents with  . Chest Pain   The history is provided by the patient and medical records. No language interpreter was used.     HPI Comments: [name] [name] is a 34 y.o. male with past medical history of CHF, nonischemic cardiomyopathy, mitral regurgitation, CKD stage II, HTN who presents to the Emergency Department complaining of 1 week of orthopnea and chest pressure when lying supine. Patient explains that he was "tired of dealing with this" today and went to [facility] ED to be evaluated; he received labs but left before being seen due to extensive wait times. Per records, [facility] ED attempted to contact him for abnormal lab values but patient denies any communication with them after leaving today.   Patient left [facility] emergency department and proceeded to [facility].  There in the ER.  There was concern about possible STEMI.  They discussed with Dr. Radford Pax and Dr. Ellyn Hack with cardiology here.  Patient was transferred as a possible STEMI to go to the Cath Lab.  At some point, code STEMI was canceled by Dr. Ellyn Hack.  Medication includes: hydralazine, lasix 40mg  (last dose 19:00 tonight), and coreg. Patient states his blood pressure regularly runs 220/130, sometimes down in the 190s or 180s. Cardiologist was previously Dr. Haroldine Laws; no cardiac evaluations in the past year.  Past Medical History  Diagnosis Date  . History of pneumonia   . Nonischemic cardiomyopathy     Normal coronaries May 2012, LVEF < 20%  . Chronic systolic heart failure [id number]  . CKD (chronic kidney disease) stage 2, GFR 60-89 ml/min   . Essential hypertension, benign   . Noncompliance   . Mitral regurgitation     Moderate   Past Surgical History  Procedure  Laterality Date  . Surgery scrotal / testicular      Testicular torsion   Family History  Problem Relation Age of Onset  . Breast cancer Mother   . Stroke Father    History  Substance Use Topics  . Smoking status: Never Smoker   . Smokeless tobacco: Never Used  . Alcohol Use: Yes     Comment: occasionally    Review of Systems  Respiratory: Positive for shortness of breath.   Cardiovascular: Positive for chest pain.  All other systems reviewed and are negative.     Allergies  Review of patient's allergies indicates no known allergies.  Home Medications   Prior to Admission medications   Medication Sig Start Date End Date Taking? Authorizing Provider  Aspirin-Acetaminophen-Caffeine (GOODY HEADACHE PO) Take 1 packet by mouth daily as needed (for pain).    Historical Provider, MD  carvedilol (COREG) 25 MG tablet Take 1 tablet (25 mg total) by mouth 2 (two) times daily with a meal. 03/29/14   Elnora Morrison, MD  cloNIDine (CATAPRES - DOSED IN MG/24 HR) 0.1 mg/24hr patch Place 0.1 mg onto the skin once a week.    Historical Provider, MD  furosemide (LASIX) 40 MG tablet Take 1 tablet (40 mg total) by mouth daily. 08/17/13   Jolaine Artist, MD  hydrALAZINE (APRESOLINE) 100 MG tablet Take 1 tablet (100 mg total) by mouth 2 (two) times daily. Patient taking differently: Take 50 mg by mouth  3 (three) times daily.  08/10/13   Amy D Ninfa Meeker, NP  metoCLOPramide (REGLAN) 10 MG tablet Take 1 tablet (10 mg total) by mouth every 6 (six) hours as needed for nausea (nausea/headache, take with benadryl). Patient not taking: Reported on 07/31/2014 03/29/14   Elnora Morrison, MD   There were no vitals taken for this visit. Physical Exam  Constitutional: He is oriented to person, place, and time. He appears well-developed and well-nourished. No distress.  HENT:  Head: Normocephalic and atraumatic.  Mouth/Throat: Oropharynx is clear and moist. No oropharyngeal exudate.  Moist mucous membranes  Eyes:  EOM are normal. Pupils are equal, round, and reactive to light.  Neck: Normal range of motion. Neck supple. No JVD present.  Cardiovascular: Normal rate, regular rhythm and normal heart sounds.  Exam reveals no gallop and no friction rub.   No murmur heard. Pulmonary/Chest: Effort normal. No respiratory distress. He has no wheezes. He has rales (bilateral bases).  Abdominal: Soft. Bowel sounds are normal. He exhibits no mass. There is no tenderness. There is no rebound and no guarding.  Musculoskeletal: Normal range of motion. He exhibits no edema.  Moves all extremities normally.   Lymphadenopathy:    He has no cervical adenopathy.  Neurological: He is alert and oriented to person, place, and time. He displays normal reflexes.  Skin: Skin is warm and dry. No rash noted.  Psychiatric: He has a normal mood and affect. His behavior is normal.  Nursing note and vitals reviewed.   ED Course  Procedures   DIAGNOSTIC STUDIES: Oxygen Saturation is 100% on RA, normal by my interpretation.    COORDINATION OF CARE: 11:23 PM Discussed treatment plan with pt at bedside and pt agreed to plan.   Labs Review Labs Reviewed  BRAIN NATRIURETIC PEPTIDE    Imaging Review Dg Chest 2 View  09/25/2014   CLINICAL DATA:  Shortness of breath and chest pain for 2 days  EXAM: CHEST  2 VIEW  COMPARISON:  April 02, 2014  FINDINGS: Lungs are clear. Heart is slightly enlarged with pulmonary vascularity within normal limits. No adenopathy. No bone lesions. No pneumothorax.  IMPRESSION: Mild cardiac enlargement.  No edema or consolidation.   Electronically Signed   By: Lowella Grip III M.D.   On: 09/25/2014 14:31     EKG Interpretation None      MDM   Final diagnoses:  Hypertensive urgency  Acute on chronic systolic congestive heart failure  Elevated troponin    I personally performed the services described in this documentation, which was scribed in my presence. The recorded information has  been reviewed and is accurate.    Case discussed with Dr. Tempie Hoist, on call for cardiology.  Patient was accepted by their service for transport from [facility] emergency department.  He will see the patient.  He recommends nitroglycerin, hydralazine and Lasix.     Linton Flemings, MD 09/26/14 [phone]

## 2014-09-25 NOTE — ED Notes (Signed)
Pt has heparin infusing at 69ml/hr on arrival to ED

## 2014-09-25 NOTE — ED Notes (Signed)
Called back no answer 

## 2014-09-25 NOTE — ED Notes (Signed)
Patient call for room assignment times once, unable to locate patient

## 2014-09-25 NOTE — ED Notes (Signed)
Per patient has history of chronic chest pain, states this episode is similar to those of the past. BP 200/128, patient states this is "good" for him. 12 lead EKG done and given to [facility], who was made aware of patients presentation including BP. Patient to waiting room.

## 2014-09-25 NOTE — ED Notes (Signed)
Having SOB and chest pain for 2 days.  Have not taken any medications for chest pain today.

## 2014-09-26 DIAGNOSIS — I509 Heart failure, unspecified: Secondary | ICD-10-CM

## 2014-09-26 DIAGNOSIS — T465X6A Underdosing of other antihypertensive drugs, initial encounter: Secondary | ICD-10-CM | POA: Diagnosis present

## 2014-09-26 DIAGNOSIS — N183 Chronic kidney disease, stage 3 (moderate): Secondary | ICD-10-CM | POA: Diagnosis present

## 2014-09-26 DIAGNOSIS — I5023 Acute on chronic systolic (congestive) heart failure: Secondary | ICD-10-CM

## 2014-09-26 DIAGNOSIS — I129 Hypertensive chronic kidney disease with stage 1 through stage 4 chronic kidney disease, or unspecified chronic kidney disease: Secondary | ICD-10-CM | POA: Diagnosis present

## 2014-09-26 DIAGNOSIS — Z9114 Patient's other noncompliance with medication regimen: Secondary | ICD-10-CM | POA: Diagnosis present

## 2014-09-26 DIAGNOSIS — I10 Essential (primary) hypertension: Secondary | ICD-10-CM

## 2014-09-26 DIAGNOSIS — I429 Cardiomyopathy, unspecified: Secondary | ICD-10-CM | POA: Diagnosis present

## 2014-09-26 DIAGNOSIS — I169 Hypertensive crisis, unspecified: Secondary | ICD-10-CM

## 2014-09-26 DIAGNOSIS — Z7982 Long term (current) use of aspirin: Secondary | ICD-10-CM | POA: Diagnosis not present

## 2014-09-26 DIAGNOSIS — I340 Nonrheumatic mitral (valve) insufficiency: Secondary | ICD-10-CM | POA: Diagnosis present

## 2014-09-26 DIAGNOSIS — E876 Hypokalemia: Secondary | ICD-10-CM | POA: Diagnosis present

## 2014-09-26 DIAGNOSIS — I5043 Acute on chronic combined systolic (congestive) and diastolic (congestive) heart failure: Secondary | ICD-10-CM | POA: Diagnosis present

## 2014-09-26 DIAGNOSIS — Z79899 Other long term (current) drug therapy: Secondary | ICD-10-CM | POA: Diagnosis not present

## 2014-09-26 LAB — CBC
HEMATOCRIT: 38.3 % — AB (ref 39.0–52.0)
HEMOGLOBIN: 13 g/dL (ref 13.0–17.0)
MCH: 26.6 pg (ref 26.0–34.0)
MCHC: 33.9 g/dL (ref 30.0–36.0)
MCV: 78.5 fL (ref 78.0–100.0)
Platelets: 211 10*3/uL (ref 150–400)
RBC: 4.88 MIL/uL (ref 4.22–5.81)
RDW: 14 % (ref 11.5–15.5)
WBC: 6.8 10*3/uL (ref 4.0–10.5)

## 2014-09-26 LAB — CREATININE, SERUM
Creatinine, Ser: 1.62 mg/dL — ABNORMAL HIGH (ref 0.50–1.35)
GFR calc Af Amer: 63 mL/min — ABNORMAL LOW (ref >90)
GFR calc non Af Amer: 54 mL/min — ABNORMAL LOW (ref >90)

## 2014-09-26 LAB — BRAIN NATRIURETIC PEPTIDE: B Natriuretic Peptide: 558.3 pg/mL — ABNORMAL HIGH (ref 0.0–100.0)

## 2014-09-26 MED ORDER — CARVEDILOL 25 MG PO TABS
25.0000 mg | ORAL_TABLET | Freq: Two times a day (BID) | ORAL | Status: DC
  Administered 2014-09-26 – 2014-09-27 (×3): 25 mg via ORAL
  Filled 2014-09-26 (×5): qty 1

## 2014-09-26 MED ORDER — SODIUM CHLORIDE 0.9 % IJ SOLN
3.0000 mL | Freq: Two times a day (BID) | INTRAMUSCULAR | Status: DC
  Administered 2014-09-26 – 2014-09-27 (×2): 3 mL via INTRAVENOUS

## 2014-09-26 MED ORDER — TRAMADOL HCL 50 MG PO TABS
50.0000 mg | ORAL_TABLET | Freq: Once | ORAL | Status: AC
  Administered 2014-09-26: 50 mg via ORAL
  Filled 2014-09-26: qty 1

## 2014-09-26 MED ORDER — HYDRALAZINE HCL 50 MG PO TABS
100.0000 mg | ORAL_TABLET | Freq: Three times a day (TID) | ORAL | Status: DC
  Administered 2014-09-26 – 2014-09-27 (×5): 100 mg via ORAL
  Filled 2014-09-26 (×8): qty 2

## 2014-09-26 MED ORDER — FUROSEMIDE 10 MG/ML IJ SOLN
40.0000 mg | Freq: Once | INTRAMUSCULAR | Status: AC
  Administered 2014-09-26: 40 mg via INTRAVENOUS
  Filled 2014-09-26: qty 4

## 2014-09-26 MED ORDER — SODIUM CHLORIDE 0.9 % IV SOLN: 250.0000 mL | INTRAVENOUS | Status: DC | PRN

## 2014-09-26 MED ORDER — ONDANSETRON HCL 4 MG/2ML IJ SOLN: 4.0000 mg | Freq: Four times a day (QID) | INTRAMUSCULAR | Status: DC | PRN

## 2014-09-26 MED ORDER — HYDRALAZINE HCL 20 MG/ML IJ SOLN: 10.0000 mg | INTRAMUSCULAR | Status: DC | PRN

## 2014-09-26 MED ORDER — NITROGLYCERIN IN D5W 200-5 MCG/ML-% IV SOLN: 2.0000 ug/min | INTRAVENOUS | Status: DC

## 2014-09-26 MED ORDER — ACETAMINOPHEN 325 MG PO TABS
650.0000 mg | ORAL_TABLET | ORAL | Status: DC | PRN
  Administered 2014-09-26 – 2014-09-27 (×2): 650 mg via ORAL
  Filled 2014-09-26 (×2): qty 2

## 2014-09-26 MED ORDER — AMLODIPINE BESYLATE 10 MG PO TABS
10.0000 mg | ORAL_TABLET | Freq: Every day | ORAL | Status: DC
  Administered 2014-09-26 – 2014-09-27 (×2): 10 mg via ORAL
  Filled 2014-09-26: qty 2
  Filled 2014-09-26: qty 1

## 2014-09-26 MED ORDER — HYDRALAZINE HCL 20 MG/ML IJ SOLN
10.0000 mg | Freq: Once | INTRAMUSCULAR | Status: AC
  Administered 2014-09-26: 02:00:00 via INTRAVENOUS
  Filled 2014-09-26: qty 1

## 2014-09-26 MED ORDER — POTASSIUM CHLORIDE CRYS ER 20 MEQ PO TBCR
20.0000 meq | EXTENDED_RELEASE_TABLET | Freq: Two times a day (BID) | ORAL | Status: DC
  Administered 2014-09-26 – 2014-09-27 (×3): 20 meq via ORAL
  Filled 2014-09-26 (×3): qty 1

## 2014-09-26 MED ORDER — FUROSEMIDE 40 MG PO TABS
40.0000 mg | ORAL_TABLET | Freq: Every day | ORAL | Status: DC
  Administered 2014-09-26 – 2014-09-27 (×2): 40 mg via ORAL
  Filled 2014-09-26: qty 1
  Filled 2014-09-26: qty 2

## 2014-09-26 MED ORDER — ENOXAPARIN SODIUM 40 MG/0.4ML ~~LOC~~ SOLN
40.0000 mg | SUBCUTANEOUS | Status: DC
  Administered 2014-09-26 – 2014-09-27 (×2): 40 mg via SUBCUTANEOUS
  Filled 2014-09-26 (×2): qty 0.4

## 2014-09-26 MED ORDER — SODIUM CHLORIDE 0.9 % IJ SOLN: 3.0000 mL | INTRAMUSCULAR | Status: DC | PRN

## 2014-09-26 NOTE — Progress Notes (Signed)
  Breathing much better. BP improved but severe HA on NTG 20 mcg/min. Renal function improved.   Will stop NTG. Continue carvedilol/hydral and amlodipine. Resume po lasix tomorrow. Possibly home in am.   Echo pending.   Daniel Bensimhon,MD 4:40 PM

## 2014-09-26 NOTE — H&P (Cosign Needed)
HPI:  Mr. Budny is a 34 y.o.male with known history of NICM (cath 2012 normal coronaries), hypertension and CKD stage II (baseline Cr 1.5-1.7) being admitted for hypertensive crisis and recurrent HF.   He was first diagnosed with HF in 2012 and his EF recovered and was instructed to stop taking carvedilol, spironolactone, and lasix by previous cardiologist. Admitted to [facility] [id number] for acute systolic heart failure. EF found to be 20%. Diuresed. Meds restarted. Discharge weight 193 lbs.   ECHO 06/13/13: EF 20-25%  Last seen in HF Clinic 08/10/13 was out of lasix for 2 weeks and not taking spiro. Has been non-complaint with f/u for over 1 year.   Says he stopped taking meds for about 1 year. About 1 month ago girlfriend was taking his BP and it was 230/120 so he started taking carvedilol, hydralazine 50 bid and lasix again. However over past 2-3 days developed worsening SOB, orthopnea, PND and chest pressure when lying down. Presented to [facility] ER this afternoon with CP and SOB - BP 200/128. Left without being seen due to long wait. Then went to [facility] with worsening CP and SOB. ECG with ? STEMI. ECG reviewed by Dr. Ellyn Hack and STEMI called off. Brought to [facility] ER. On arrival patient CP free. SBP > 190. Started on NTG gtt for BP control and given 1 dose IV lasix. Has diuresed well over 1L - BP now 160/100. Feels much better.   Review of Systems:     Cardiac Review of Systems: {Y] = yes [ ]  = no  Chest Pain [  y  ]  Resting SOB [  y ] Exertional SOB  [ y ]  Vertell Limber Blue.Reese  ]   Pedal Edema [   ]    Palpitations [  ] Syncope  [  ]   Presyncope [   ]  General Review of Systems: [Y] = yes [  ]=no Constitional: recent weight change [  ]; anorexia [  ]; fatigue [ y ]; nausea [  ]; night sweats [  ]; fever [  ]; or chills [  ];                                                                     Dental: poor dentition[  ];   Eye : blurred vision [  ]; diplopia [   ]; vision changes [  ];  Amaurosis fugax[   ]; Resp: cough [  ];  wheezing[  ];  hemoptysis[  ]; shortness of breath[  ]; paroxysmal nocturnal dyspnea[  ]; dyspnea on exertion[  ]; or orthopnea[  ];  GI:  gallstones[  ], vomiting[  ];  dysphagia[  ]; melena[  ];  hematochezia [  ]; heartburn[  ];   GU: kidney stones [  ]; hematuria[  ];   dysuria [  ];  nocturia[  ];               Skin: rash [  ], swelling[  ];, hair loss[  ];  peripheral edema[  ];  or itching[  ]; Musculosketetal: myalgias[  ];  joint swelling[  ];  joint erythema[  ];  joint pain[  ];  back pain[  ];  Heme/Lymph: bruising[  ];  bleeding[  ];  anemia[  ];  Neuro: TIA[  ];  headaches[  ];  stroke[  ];  vertigo[  ];  seizures[  ];   paresthesias[  ];  difficulty walking[  ];  Psych:depression[  ]; anxiety[  ];  Endocrine: diabetes[  ];  thyroid dysfunction[  ];  Other:  Past Medical History  Diagnosis Date  . History of pneumonia   . Nonischemic cardiomyopathy     Normal coronaries May 2012, LVEF < 20%  . Chronic systolic heart failure [id number]  . CKD (chronic kidney disease) stage 2, GFR 60-89 ml/min   . Essential hypertension, benign   . Noncompliance   . Mitral regurgitation     Moderate  . CHF (congestive heart failure)    Prior to Admission medications   Medication Sig Start Date End Date Taking? Authorizing Provider  carvedilol (COREG) 25 MG tablet Take 25 mg by mouth 2 (two) times daily with a meal.   Yes Historical Provider, MD  furosemide (LASIX) 40 MG tablet Take 40 mg by mouth daily.   Yes Historical Provider, MD  hydrALAZINE (APRESOLINE) 50 MG tablet Take 50 mg by mouth 2 (two) times daily.   Yes Historical Provider, MD  Aspirin-Acetaminophen-Caffeine (GOODY HEADACHE PO) Take 1 packet by mouth daily as needed (for pain).    Historical Provider, MD  carvedilol (COREG) 25 MG tablet Take 1 tablet (25 mg total) by mouth 2 (two) times daily with a meal. 03/29/14   Elnora Morrison, MD  cloNIDine (CATAPRES - DOSED IN MG/24 HR) 0.1 mg/24hr patch Place 0.1 mg  onto the skin once a week.    Historical Provider, MD  furosemide (LASIX) 40 MG tablet Take 1 tablet (40 mg total) by mouth daily. 08/17/13   Jolaine Artist, MD  hydrALAZINE (APRESOLINE) 100 MG tablet Take 1 tablet (100 mg total) by mouth 2 (two) times daily. Patient taking differently: Take 50 mg by mouth 3 (three) times daily.  08/10/13   Amy D Ninfa Meeker, NP  metoCLOPramide (REGLAN) 10 MG tablet Take 1 tablet (10 mg total) by mouth every 6 (six) hours as needed for nausea (nausea/headache, take with benadryl). Patient not taking: Reported on 07/31/2014 03/29/14   Elnora Morrison, MD     No Known Allergies  History   Social History  . Marital Status: Single    Spouse Name: N/A  . Number of Children: N/A  . Years of Education: N/A   Occupational History  . Not on file.   Social History Main Topics  . Smoking status: Never Smoker   . Smokeless tobacco: Never Used  . Alcohol Use: Yes     Comment: occasionally  . Drug Use: No  . Sexual Activity: Yes    Birth Control/ Protection: Condom   Other Topics Concern  . Not on file   Social History Narrative   Occupation: just started sanitation job cleaning    Family History  Problem Relation Age of Onset  . Breast cancer Mother   . Stroke Father     PHYSICAL EXAM: Filed Vitals:   09/26/14 0030  BP: 178/118  Pulse: 95  Temp:   Resp: 22   General:  Well appearing. No respiratory difficulty HEENT: normal Neck: supple. JVP 6 Carotids 2+ bilat; no bruits. No lymphadenopathy or thryomegaly appreciated. Cor: PMI nondisplaced. Regular rate & rhythm. No rubs, gallops or murmurs. Lungs: clear Abdomen: soft, nontender, nondistended. No hepatosplenomegaly. No bruits or masses. Good bowel sounds.  Extremities: no cyanosis, clubbing, rash, edema Neuro: alert & oriented x 3, cranial nerves grossly intact. moves all 4 extremities w/o difficulty. Affect pleasant.  ECG: ST 109 with LVH, J-point elevation and repol abnormalities   Results  for orders placed or performed during the hospital encounter of 09/25/14 (from the past 24 hour(s))  Troponin I (MHP)     Status: Abnormal   Collection Time: 09/25/14  2:03 PM  Result Value Ref Range   Troponin I 0.20 (H) <0.031 ng/mL  CBC     Status: Abnormal   Collection Time: 09/25/14  2:03 PM  Result Value Ref Range   WBC 7.5 4.0 - 10.5 K/uL   RBC 4.86 4.22 - 5.81 MIL/uL   Hemoglobin 12.9 (L) 13.0 - 17.0 g/dL   HCT 39.3 39.0 - 52.0 %   MCV 80.9 78.0 - 100.0 fL   MCH 26.5 26.0 - 34.0 pg   MCHC 32.8 30.0 - 36.0 g/dL   RDW 14.2 11.5 - 15.5 %   Platelets 209 150 - 400 K/uL  Basic metabolic panel     Status: Abnormal   Collection Time: 09/25/14  2:03 PM  Result Value Ref Range   Sodium 137 135 - 145 mmol/L   Potassium 3.5 3.5 - 5.1 mmol/L   Chloride 101 96 - 112 mmol/L   CO2 28 19 - 32 mmol/L   Glucose, Bld 153 (H) 70 - 99 mg/dL   BUN 27 (H) 6 - 23 mg/dL   Creatinine, Ser 2.10 (H) 0.50 - 1.35 mg/dL   Calcium 9.2 8.4 - 10.5 mg/dL   GFR calc non Af Amer 40 (L) >90 mL/min   GFR calc Af Amer 46 (L) >90 mL/min   Anion gap 8 5 - 15   Dg Chest 2 View  09/25/2014   CLINICAL DATA:  Shortness of breath and chest pain for 2 days  EXAM: CHEST  2 VIEW  COMPARISON:  April 02, 2014  FINDINGS: Lungs are clear. Heart is slightly enlarged with pulmonary vascularity within normal limits. No adenopathy. No bone lesions. No pneumothorax.  IMPRESSION: Mild cardiac enlargement.  No edema or consolidation.   Electronically Signed   By: Lowella Grip III M.D.   On: 09/25/2014 14:31     ASSESSMENT: 1. Hypertensive crisis 2. A/C systolic HF    --EF [id number] 3. Medication noncompliance 4. CKD, stage III 5. Hypokalemia  PLAN/DISCUSSION:  He has severe HTN in setting of medication noncompliance. Volume overload much better with one dose of lasix and now appears euvolemic. CP unlikely to be ischemic - mildly elevated troponin likely due to HF.   Will admit for BP control and repeat echo. Can stop  heparin. Hopefully home in 2 days.   Aijalon Kirtz,MD 1:33 AM

## 2014-09-26 NOTE — ED Notes (Signed)
Per Dr Bensimhon's note, Heparin drip discontinued.

## 2014-09-26 NOTE — ED Notes (Signed)
Patient has returned from echo to the emergency department.

## 2014-09-26 NOTE — Progress Notes (Signed)
  Echocardiogram 2D Echocardiogram has been performed.  Diamond Nickel 09/26/2014, 12:53 PM

## 2014-09-27 LAB — BASIC METABOLIC PANEL
Anion gap: 10 (ref 5–15)
BUN: 20 mg/dL (ref 6–23)
CO2: 25 mmol/L (ref 19–32)
Calcium: 9.4 mg/dL (ref 8.4–10.5)
Chloride: 100 mmol/L (ref 96–112)
Creatinine, Ser: 1.76 mg/dL — ABNORMAL HIGH (ref 0.50–1.35)
GFR calc Af Amer: 57 mL/min — ABNORMAL LOW (ref >90)
GFR calc non Af Amer: 49 mL/min — ABNORMAL LOW (ref >90)
GLUCOSE: 103 mg/dL — AB (ref 70–99)
Potassium: 3.6 mmol/L (ref 3.5–5.1)
Sodium: 135 mmol/L (ref 135–145)

## 2014-09-27 MED ORDER — HYDRALAZINE HCL 100 MG PO TABS: 100.0000 mg | ORAL_TABLET | Freq: Three times a day (TID) | ORAL | Status: DC

## 2014-09-27 MED ORDER — CARVEDILOL 25 MG PO TABS: 25.0000 mg | ORAL_TABLET | Freq: Two times a day (BID) | ORAL | Status: DC

## 2014-09-27 MED ORDER — FUROSEMIDE 40 MG PO TABS: 40.0000 mg | ORAL_TABLET | Freq: Every day | ORAL | Status: DC

## 2014-09-27 MED ORDER — POTASSIUM CHLORIDE CRYS ER 20 MEQ PO TBCR: 20.0000 meq | EXTENDED_RELEASE_TABLET | Freq: Two times a day (BID) | ORAL | Status: DC

## 2014-09-27 MED ORDER — POTASSIUM CHLORIDE CRYS ER 20 MEQ PO TBCR: 20.0000 meq | EXTENDED_RELEASE_TABLET | Freq: Every day | ORAL | Status: DC

## 2014-09-27 NOTE — Evaluation (Signed)
Physical Therapy Evaluation Patient Details Name: MAE BEAUCHAMP MRN: [id number] DOB: [birth year] Today's Date: 09/27/2014   History of Present Illness  Mr. Bertram is a 34 y.o.male with known history of NICM (cath 2012 normal coronaries), hypertension and CKD stage II (baseline Cr 1.5-1.7) being admitted for hypertensive crisis and recurrent HF.   Clinical Impression  Pt feels back to baseline functioning and showed to be Independent and safe even completing high level balance/endurance challenges.  No further PT needs.  Signing off.    Follow Up Recommendations No PT follow up    Equipment Recommendations  None recommended by PT    Recommendations for Other Services       Precautions / Restrictions Precautions Precautions: None      Mobility  Bed Mobility Overal bed mobility: Independent                Transfers Overall transfer level: Independent                  Ambulation/Gait Ambulation/Gait assistance: Independent     Gait Pattern/deviations: WFL(Within Functional Limits) Gait velocity: WFL Gait velocity interpretation: at or above normal speed for age/gender General Gait Details: fluid gait  Stairs Stairs: Yes Stairs assistance: Independent Stair Management: No rails;Alternating pattern;Forwards Number of Stairs: 10 General stair comments: safe and fluid  Wheelchair Mobility    Modified Rankin (Stroke Patients Only)       Balance Overall balance assessment: Independent                               Standardized Balance Assessment Standardized Balance Assessment : Dynamic Gait Index   Dynamic Gait Index Level Surface: Normal Change in Gait Speed: Normal Gait with Horizontal Head Turns: Normal Gait with Vertical Head Turns: Normal Gait and Pivot Turn: Normal Step Over Obstacle: Normal Step Around Obstacles: Normal Steps: Normal Total Score: 24       Pertinent Vitals/Pain      Home Living Family/patient  expects to be discharged to:: Private residence Living Arrangements: Spouse/significant other   Type of Home: House Home Access: Stairs to enter   Technical brewer of Steps: several Home Layout: One level Home Equipment: None      Prior Function Level of Independence: Independent               Hand Dominance        Extremity/Trunk Assessment   Upper Extremity Assessment: Defer to OT evaluation           Lower Extremity Assessment: Overall WFL for tasks assessed      Cervical / Trunk Assessment: Normal  Communication   Communication: No difficulties  Cognition Arousal/Alertness: Awake/alert Behavior During Therapy: WFL for tasks assessed/performed Overall Cognitive Status: Within Functional Limits for tasks assessed                      General Comments General comments (skin integrity, edema, etc.): No dyspnea noted and pt report no fatigue.    Exercises        Assessment/Plan    PT Assessment Patent does not need any further PT services  PT Diagnosis     PT Problem List    PT Treatment Interventions     PT Goals (Current goals can be found in the Care Plan section) Acute Rehab PT Goals PT Goal Formulation: All assessment and education complete, DC therapy    Frequency  Barriers to discharge        Co-evaluation               End of Session   Activity Tolerance: Patient tolerated treatment well Patient left: in bed Nurse Communication: Mobility status         Time: [id number] PT Time Calculation (min) (ACUTE ONLY): 17 min   Charges:   PT Evaluation $Initial PT Evaluation Tier I: 1 Procedure     PT G Codes:        Amariyana Heacox, Tessie Fass 09/27/2014, 2:46 PM 09/27/2014  Donnella Sham, [place] [phone]  (pager)

## 2014-09-27 NOTE — Progress Notes (Signed)
UR Completed Brion Hedges Graves-Bigelow, RN,BSN [phone]  

## 2014-09-27 NOTE — Progress Notes (Signed)
Subjective:  [name] [name] is a 34 y.o.male with known history of NICM (cath 2012 normal coronaries), hypertension and CKD stage II (baseline Cr 1.5-1.7) being admitted for hypertensive crisis and recurrent HF.   He was first diagnosed with HF in 2012 and his EF recovered and was instructed to stop taking carvedilol, spironolactone, and lasix by previous cardiologist. Admitted to [facility] [id number] for acute systolic heart failure. EF found to be 20%. Diuresed. Meds restarted. Discharge weight 193 lbs.   ECHO 06/13/13: EF 20-25%  Last seen in HF Clinic 08/10/13 was out of lasix for 2 weeks and not taking spiro. Has been non-complaint with f/u for over 1 year.   Says he stopped taking meds for about 1 year. About 1 month ago girlfriend was taking his BP and it was 230/120 so he started taking carvedilol, hydralazine 50 bid and lasix again. However over past 2-3 days developed worsening SOB, orthopnea, PND and chest pressure when lying down. Presented to [facility] ER this afternoon with CP and SOB - BP 200/128. Left without being seen due to long wait. Then went to [facility] with worsening CP and SOB. ECG with ? STEMI. ECG reviewed by Dr. Ellyn Hack and STEMI called off. Brought to [facility] ER. On arrival patient CP free. SBP > 190. Started on NTG gtt for BP control and given 1 dose IV lasix. Has diuresed well over 1L - BP now 160/100. Feels much better.   03/17: Feels a little light-headed this am, not going to pass out. No chest pain or SOB.   Intake/Output Summary (Last 24 hours) at 09/27/14 1007 Last data filed at 09/27/14 0600  Gross per 24 hour  Intake    600 ml  Output   1225 ml  Net   -625 ml    Current meds: . amLODipine  10 mg Oral Daily  . carvedilol  25 mg Oral BID WC  . enoxaparin (LOVENOX) injection  40 mg Subcutaneous Q24H  . furosemide  40 mg Oral Daily  . hydrALAZINE  100 mg Oral 3 times per day  . potassium chloride  20 mEq Oral BID  . sodium chloride  3 mL Intravenous Q12H    Infusions: none  Objective:  Blood pressure 139/85, pulse 93, temperature 98.3 F (36.8 C), temperature source Oral, resp. rate 20, height 5\' 10"  (1.778 m), weight 199 lb 11.2 oz (90.583 kg), SpO2 96 %. Weight change: 2 lb 9.6 oz (1.18 kg)  Physical Exam: General:  Well appearing. No resp difficulty HEENT: normal Neck: supple. JVP 8 cm. Carotids 2+ bilat; no bruits. No lymphadenopathy or thryomegaly appreciated. Cor: PMI nondisplaced. Regular rate & rhythm. No rubs, gallops or murmurs. Lungs: clear Abdomen: soft, nontender, nondistended. No hepatosplenomegaly. No bruits or masses. Good bowel sounds. Extremities: no cyanosis, clubbing, rash, edema Neuro: alert & orientedx3, cranial nerves grossly intact. moves all 4 extremities w/o difficulty. Affect pleasant  Telemetry: SR, ST, no sig ectopy  Lab Results: Basic Metabolic Panel:  Recent Labs Lab 09/25/14 1403 09/26/14 0500 09/27/14 0437  NA 137  --  135  K 3.5  --  3.6  CL 101  --  100  CO2 28  --  25  GLUCOSE 153*  --  103*  BUN 27*  --  20  CREATININE 2.10* 1.62* 1.76*  CALCIUM 9.2  --  9.4   CBC:  Recent Labs Lab 09/25/14 1403 09/26/14 0500  WBC 7.5 6.8  HGB 12.9* 13.0  HCT 39.3 38.3*  MCV 80.9 78.5  PLT 209 211   Cardiac Enzymes:  Recent Labs Lab 09/25/14 1403  TROPONINI 0.20*    Imaging: Dg Chest 2 View 09/25/2014   CLINICAL DATA:  Shortness of breath and chest pain for 2 days  EXAM: CHEST  2 VIEW  COMPARISON:  April 02, 2014  FINDINGS: Lungs are clear. Heart is slightly enlarged with pulmonary vascularity within normal limits. No adenopathy. No bone lesions. No pneumothorax.  IMPRESSION: Mild cardiac enlargement.  No edema or consolidation.   Electronically Signed   By: Lowella Grip III M.D.   On: 09/25/2014 14:31     ASSESSMENT:  Principal Problem:   Hypertensive crisis - BP control much improved - encouraged compliance w/ rx as OP  Active Problems:   Acute on chronic combined  systolic and diastolic CHF (congestive heart failure) - volume status appears good    Chest pain - resolved w/ improvement in BP    Elevated troponin - felt 2nd strain, not true MI    CKD (chronic kidney disease) stage 3, GFR 30-59 ml/min - At baseline  PLAN/DISCUSSION: [name] [name] has greatly improved. The importance of compliance was emphasized. Will have him ambulate w/ staff to make sure does OK.  He works at the [employer] in [place], MD advise when he can go back to work.  Think OK for d/c today, has early f/u appt in CHF clinic.  Rosaria Ferries, PA-C 09/27/2014 10:07 AM Beeper [id number]   LOS: 1 day   Patient seen and examined with Rosaria Ferries, PA-C. We discussed all aspects of the encounter. I agree with the assessment and plan as stated above.   Looks good. EF up to 40% on echo. Can go home on current regimen. F/u soon in HF Clinic. Ok to return to work next week.   Daniel Bensimhon,MD 2:04 PM

## 2014-09-27 NOTE — Care Management Note (Signed)
    Page 1 of 1   09/27/2014     12:18:31 PM CARE MANAGEMENT NOTE 09/27/2014  Patient:  [name] [name], [name] [name]   Account Number:  [id number]  Date Initiated:  09/27/2014  Documentation initiated by:  GRAVES-BIGELOW,Sadao Weyer  Subjective/Objective Assessment:   Pt admitted for HTN crisis. Pt has Airline pilot.     Action/Plan:   No needs identified by CM at this time.   Anticipated DC Date:  09/27/2014   Anticipated DC Plan:  [place]  CM consult      Choice offered to / List presented to:             Status of service:  Completed, signed off Medicare Important Message given?  NO (If response is "NO", the following Medicare IM given date fields will be blank) Date Medicare IM given:   Medicare IM given by:   Date Additional Medicare IM given:   Additional Medicare IM given by:    Discharge Disposition:  HOME/SELF CARE  Per UR Regulation:  Reviewed for med. necessity/level of care/duration of stay  If discussed at [place] of Stay Meetings, dates discussed:    Comments:

## 2014-09-27 NOTE — Discharge Summary (Signed)
CARDIOLOGY DISCHARGE SUMMARY   Patient ID: [name] [name] MRN: [id number] DOB/AGE: [birth year] 34 y.o.  Admit date: 09/25/2014 Discharge date: 09/27/2014  PCP: Pcp Not In System Primary Cardiologist: Dr. Haroldine Laws  Primary Discharge Diagnosis:  Hypertensive crisis Secondary Discharge Diagnosis:    Acute on chronic combined systolic and diastolic CHF (congestive heart failure)   Chest pain   Elevated troponin   CKD (chronic kidney disease) stage 3, GFR 30-59 ml/min  Procedures: 2-D echocardiogram  Hospital Course: [name] [name] is a 34 y.o. male with no history of CAD. He has a history of nonischemic cardiomyopathy, responds well to medications. He had noncompliant with medications and was noted to have a blood pressure of 2:30/120. He developed increasing shortness of breath, orthopnea and PND. He came to the hospital where he was started on a nitroglycerin drip for blood pressure control and given IV Lasix. He diuresed well with this.  He was started on his previous medications and his blood pressure improved. Once his blood pressure improved and his final status improved, his symptoms improved. He had a mildly elevated troponin that was felt secondary to the hypertensive crisis in the heart failure, no further ischemic workup indicated. He had a 2-D echocardiogram, results below. His EF is 40%.  On 09/27/2014, he was seen by Dr. Ronna Polio and all data were reviewed. He was noted to have chronic kidney disease but his BUN and creatinine actually improved with diuresis and are not considered at baseline. His blood pressure control is greatly improved. The importance of compliance with blood pressure medications and follow-up was emphasized. [name] [name] is considered stable for discharge, to follow up as an outpatient.  Labs:   Lab Results  Component Value Date   WBC 6.8 09/26/2014   HGB 13.0 09/26/2014   HCT 38.3* 09/26/2014   MCV 78.5 09/26/2014   PLT 211 09/26/2014     Recent Labs Lab 09/27/14 0437  NA 135  K 3.6  CL 100  CO2 25  BUN 20  CREATININE 1.76*  CALCIUM 9.4  GLUCOSE 103*    Recent Labs  09/25/14 1403  TROPONINI 0.20*    B NATRIURETIC PEPTIDE  Date/Time Value Ref Range Status  09/26/2014 05:00 AM 558.3* 0.0 - 100.0 pg/mL Final      Radiology: Dg Chest 2 View 09/25/2014   CLINICAL DATA:  Shortness of breath and chest pain for 2 days  EXAM: CHEST  2 VIEW  COMPARISON:  April 02, 2014  FINDINGS: Lungs are clear. Heart is slightly enlarged with pulmonary vascularity within normal limits. No adenopathy. No bone lesions. No pneumothorax.  IMPRESSION: Mild cardiac enlargement.  No edema or consolidation.   Electronically Signed   By: Lowella Grip III M.D.   On: 09/25/2014 14:31     EKG: 09/25/2014 Sinus tachycardia, rate 109, LDH  Echo: 09/26/2014 Conclusions - Left ventricle: Mild global hypokinesis. Moderate hypokinesis inferior wall. The cavity size was at the upper limits of normal. Wall thickness was increased in a pattern of severe LVH. The estimated ejection fraction was 40%. Findings consistent with left ventricular diastolic dysfunction. Doppler parameters are consistent with high ventricular filling pressure. - Aortic valve: Sclerosis without stenosis. There was mild regurgitation. - Mitral valve: There was mild regurgitation. - Left atrium: The atrium was moderately dilated. - Right ventricle: The cavity size was normal. Systolic function was normal.  FOLLOW UP PLANS AND APPOINTMENTS No Known Allergies   Medication List    STOP taking these  medications        cloNIDine 0.1 mg/24hr patch  Commonly known as:  CATAPRES - Dosed in mg/24 hr     GOODY HEADACHE PO      TAKE these medications        carvedilol 25 MG tablet  Commonly known as:  COREG  Take 1 tablet (25 mg total) by mouth 2 (two) times daily with a meal.     furosemide 40 MG tablet  Commonly known as:  LASIX  Take 1  tablet (40 mg total) by mouth daily.     hydrALAZINE 100 MG tablet  Commonly known as:  APRESOLINE  Take 1 tablet (100 mg total) by mouth 3 (three) times daily.     metoCLOPramide 10 MG tablet  Commonly known as:  REGLAN  Take 1 tablet (10 mg total) by mouth every 6 (six) hours as needed for nausea (nausea/headache, take with benadryl).     potassium chloride SA 20 MEQ tablet  Commonly known as:  K-DUR,KLOR-CON  Take 1 tablet (20 mEq total) by mouth daily.        Discharge Instructions    Diet - low sodium heart healthy    Complete by:  As directed      Increase activity slowly    Complete by:  As directed           Follow-up Information    Follow up with [facility] On 10/03/2014.   Why:  See provider at 8:40 am, please arrive 15 minutes early for paperwork.   Contact information:   [place] [address] [phone]      BRING ALL MEDICATIONS WITH YOU TO FOLLOW UP APPOINTMENTS  Time spent with patient to include physician time: 44 min Signed: Rosaria Ferries, PA-C 09/27/2014, 4:57 PM Co-Sign MD  Patient seen and examined with Rosaria Ferries, PA-C. We discussed all aspects of the encounter. I agree with the assessment and plan as stated above.  He is ready for d/c. Stressed need for medication compliance and f/u.   Daniel Bensimhon,MD 1:15 PM

## 2014-10-02 ENCOUNTER — Telehealth (HOSPITAL_COMMUNITY): Payer: Self-pay | Admitting: Vascular Surgery

## 2014-10-02 NOTE — Telephone Encounter (Signed)
Spoke w/pt he states he is continuing to have headaches and he has not gone back to work due to the headaches.  He states that last night he was having some SOB but it is a little better today.  He states his wt is stable at 200, he did have edema in feet last night but better today.  He does check BP at home and states it has been pretty good except yesterday it was 160/91.  Pt is sch to see [place] for f/u appt tomorrow, since he is feeling better will not make changes at this time will discuss at f/u appt 3/23, pt advised to bring all medication bottles with him

## 2014-10-02 NOTE — Telephone Encounter (Signed)
Pt left message pt is still having headaches from his medication or he needs new medication,, he has not been to work and he needs another note for work.. Please advise

## 2014-10-03 ENCOUNTER — Ambulatory Visit (HOSPITAL_COMMUNITY)
Admit: 2014-10-03 | Discharge: 2014-10-03 | Disposition: A | Discharge status: Home / Self Care | Payer: BLUE CROSS/BLUE SHIELD | Source: Ambulatory Visit | Attending: Adult Health | Admitting: Adult Health

## 2014-10-03 ENCOUNTER — Encounter (HOSPITAL_COMMUNITY): Payer: Self-pay | Admitting: Cardiology

## 2014-10-03 VITALS — BP 168/98 | HR 88 | Wt 208.5 lb

## 2014-10-03 DIAGNOSIS — R0683 Snoring: Secondary | ICD-10-CM | POA: Diagnosis not present

## 2014-10-03 DIAGNOSIS — I129 Hypertensive chronic kidney disease with stage 1 through stage 4 chronic kidney disease, or unspecified chronic kidney disease: Secondary | ICD-10-CM | POA: Diagnosis not present

## 2014-10-03 DIAGNOSIS — I5023 Acute on chronic systolic (congestive) heart failure: Secondary | ICD-10-CM | POA: Diagnosis not present

## 2014-10-03 DIAGNOSIS — I5022 Chronic systolic (congestive) heart failure: Secondary | ICD-10-CM | POA: Insufficient documentation

## 2014-10-03 DIAGNOSIS — Z79899 Other long term (current) drug therapy: Secondary | ICD-10-CM | POA: Diagnosis not present

## 2014-10-03 DIAGNOSIS — I429 Cardiomyopathy, unspecified: Secondary | ICD-10-CM | POA: Diagnosis not present

## 2014-10-03 DIAGNOSIS — D45 Polycythemia vera: Secondary | ICD-10-CM | POA: Diagnosis not present

## 2014-10-03 DIAGNOSIS — Z9119 Patient's noncompliance with other medical treatment and regimen: Secondary | ICD-10-CM | POA: Insufficient documentation

## 2014-10-03 DIAGNOSIS — N182 Chronic kidney disease, stage 2 (mild): Secondary | ICD-10-CM | POA: Insufficient documentation

## 2014-10-03 LAB — BRAIN NATRIURETIC PEPTIDE: B Natriuretic Peptide: 646.1 pg/mL — ABNORMAL HIGH (ref 0.0–100.0)

## 2014-10-03 LAB — BASIC METABOLIC PANEL
Anion gap: 7 (ref 5–15)
BUN: 18 mg/dL (ref 6–23)
CALCIUM: 9.3 mg/dL (ref 8.4–10.5)
CO2: 26 mmol/L (ref 19–32)
Chloride: 105 mmol/L (ref 96–112)
Creatinine, Ser: 1.99 mg/dL — ABNORMAL HIGH (ref 0.50–1.35)
GFR calc Af Amer: 49 mL/min — ABNORMAL LOW (ref >90)
GFR, EST NON AFRICAN AMERICAN: 42 mL/min — AB (ref >90)
Glucose, Bld: 115 mg/dL — ABNORMAL HIGH (ref 70–99)
Potassium: 3.9 mmol/L (ref 3.5–5.1)
SODIUM: 138 mmol/L (ref 135–145)

## 2014-10-03 MED ORDER — AMLODIPINE BESYLATE 5 MG PO TABS: 5.0000 mg | ORAL_TABLET | Freq: Every day | ORAL | Status: DC

## 2014-10-03 NOTE — Patient Instructions (Signed)
START Amlodipine 5 mg daily  Labs today  You have been referred to [facility] for sleep study and sleep evaluation  Your physician recommends that you schedule a follow-up appointment in: 3 weeks  Do the following things EVERYDAY: 1) Weigh yourself in the morning before breakfast. Write it down and keep it in a log. 2) Take your medicines as prescribed 3) Eat low salt foods-Limit salt (sodium) to 2000 mg per day.  4) Stay as active as you can everyday 5) Limit all fluids for the day to less than 2 liters 6)

## 2014-10-03 NOTE — Progress Notes (Signed)
Patient ID: XZAVIAR CANEL, male   DOB: 04/10/1981, 34 y.o.   MRN: [id number]  Primary PCP: [place]  Primary Cardiologist: Dr. Haroldine Laws  HPI: Mr. Agro is a 34 y.o.male with known history of NICM, hypertension and CKD stage II (baseline Cr 1.5-1.7).   He was first diagnosed with HF in 2012 and his EF recovered and was instructed to stop taking carvedilol, spironolactone, and lasix by previous cardiologist. Admitted to [facility] [id number] for acute systolic heart failure. 12/31/11 ProBNP 3777. HIV nonreactive. Thyroid panel normal. Renal ultrasound was negative for obstruction. EF 20%. Discharge weight 193 lbs.   He returns for follow up. Denies SOB/PND/Orthopnea. SBP at home 130-160. Weight at home 200-203 pounds. Taking medications but missed a dose of hydralazine last night. Works full time at [name] on third shirt. Tries to follow low salt diet. Drinking > 2 liters of fluid per day. SBP typically in 130 range at home but over last 2 days more like 150-160.   ECHO 06/13/13: EF 20-25% ECHO 09/26/2014: EF 40%   06/14/13: K 3.6 Cr 2.00 06/27/14 K 3.7 Creatinine 1.42  09/27/2014: K 3.6 Creatinine 1.76   ROS: All systems negative except as listed in HPI, PMH and Problem List.  Past Medical History  Diagnosis Date  . History of pneumonia   . Nonischemic cardiomyopathy     Normal coronaries May 2012, LVEF < 20%  . Chronic systolic heart failure [id number]  . CKD (chronic kidney disease) stage 2, GFR 60-89 ml/min   . Essential hypertension, benign   . Noncompliance   . Mitral regurgitation     Moderate  . CHF (congestive heart failure)     Current Outpatient Prescriptions  Medication Sig Dispense Refill  . acetaminophen (TYLENOL) 500 MG tablet Take 500 mg by mouth every 6 (six) hours as needed.    . carvedilol (COREG) 25 MG tablet Take 1 tablet (25 mg total) by mouth 2 (two) times daily with a meal. 60 tablet 3  . furosemide (LASIX) 40 MG tablet Take 1 tablet (40 mg total) by mouth  daily. 30 tablet 3  . hydrALAZINE (APRESOLINE) 100 MG tablet Take 1 tablet (100 mg total) by mouth 3 (three) times daily. 90 tablet 3  . potassium chloride SA (K-DUR,KLOR-CON) 20 MEQ tablet Take 1 tablet (20 mEq total) by mouth daily. 30 tablet 3   No current facility-administered medications for this encounter.      Filed Vitals:   10/03/14 0849  BP: 168/98  Pulse: 88  Weight: 208 lb 8 oz (94.575 kg)  SpO2: 98%    PHYSICAL EXAM: General:  Well appearing. No resp difficulty  HEENT: normal Neck: supple. JVP 5-6 . Carotids 2+ bilaterally; no bruits. No lymphadenopathy or thryomegaly appreciated. Cor: PMI normal. Regular rate & rhythm. No rubs, gallops or murmurs. Lungs: clear Abdomen: soft, nontender, nondistended. No hepatosplenomegaly. No bruits or masses. Good bowel sounds. Extremities: no cyanosis, clubbing, rash, edema Neuro: alert & orientedx3, cranial nerves grossly intact. Moves all 4 extremities w/o difficulty. Affect pleasant.   ASSESSMENT & PLAN:  1) Chronic systolic HF: NICM, EF 0000000 (06/2013)-->ECHO EF 40%  -- Post hospital folow Currently NYHA I.  Volume status stable. Continue lasix 40 mg daily  Continue hydralazine 100 mg three times a day.  He is not Imdur due to headaches. He is not on Ace due to elevated creatinine.  Reinforced medication compliance, limiting fluid intake to < 2 liters per day, and daily weights.  --2) HTN : Elevated but  he has not been taking lasix or spironolactone. Will restart spiro. I have reinforced medication compliance.  3) CKD stage II:  (baseline Cr 1.5-1.7) Repeat BMET  4) Snoring: Has mild polycythemia. Likely has OSA. Refer to pulmonary for sleep study.   Follow up in 3 weeks.    CLEGG,AMY NP-C  9:17 AM  Patient seen and examined with Darrick Grinder, NP. We discussed all aspects of the encounter. I agree with the assessment and plan as stated above.   Doing well. Volume status looks good. BP still. Add back spiro. May need to  start norvasc 5 at next visit. Check BMET and in one week.  Benay Spice 9:37 AM

## 2014-10-25 ENCOUNTER — Inpatient Hospital Stay (HOSPITAL_COMMUNITY): Admission: RE | Admit: 2014-10-25 | Payer: BLUE CROSS/BLUE SHIELD | Source: Ambulatory Visit

## 2014-10-29 ENCOUNTER — Ambulatory Visit (INDEPENDENT_AMBULATORY_CARE_PROVIDER_SITE_OTHER): Payer: BLUE CROSS/BLUE SHIELD | Admitting: Internal Medicine

## 2014-10-29 ENCOUNTER — Encounter: Payer: Self-pay | Admitting: Internal Medicine

## 2014-10-29 VITALS — BP 150/82 | HR 83 | Ht 69.25 in | Wt 209.4 lb

## 2014-10-29 DIAGNOSIS — G4733 Obstructive sleep apnea (adult) (pediatric): Secondary | ICD-10-CM

## 2014-10-29 DIAGNOSIS — I429 Cardiomyopathy, unspecified: Secondary | ICD-10-CM | POA: Diagnosis not present

## 2014-10-29 DIAGNOSIS — G4726 Circadian rhythm sleep disorder, shift work type: Secondary | ICD-10-CM

## 2014-10-29 DIAGNOSIS — I119 Hypertensive heart disease without heart failure: Secondary | ICD-10-CM

## 2014-10-29 DIAGNOSIS — I43 Cardiomyopathy in diseases classified elsewhere: Secondary | ICD-10-CM

## 2014-10-29 NOTE — Progress Notes (Signed)
10/29/14- 67 yoM referred courtesy of Amy Clegg NP at Cardiology-never had sleep study; snoring. Third shift Insurance underwriter. Gets off at 7 AM and lies down around 9 AM, sleeping till he wakes spontaneously about 1:30 PM. Frequent waking during sleep, usually for bathroom. Even if he sleeps at night he wakes 3 or 4 times. Sleepy driving and at work. Active job at night does help stay awake. Denies sleep medicines or caffeine. Denies cataplexy but admits rare sleep paralysis events. No similar family history. Previous ENT surgery, treatment for hypertension and hypertensive cardiomyopathy with congestive heart failure. Some allergic rhinitis and nasal congestion. Single, rare alcohol. Has girlfriend.  Prior to Admission medications   Medication Sig Start Date End Date Taking? Authorizing Provider  acetaminophen (TYLENOL) 500 MG tablet Take 500 mg by mouth every 6 (six) hours as needed.   Yes Historical Provider, MD  carvedilol (COREG) 25 MG tablet Take 1 tablet (25 mg total) by mouth 2 (two) times daily with a meal. 09/27/14  Yes Rhonda G Barrett, PA-C  furosemide (LASIX) 40 MG tablet Take 1 tablet (40 mg total) by mouth daily. 09/27/14  Yes Rhonda G Barrett, PA-C  hydrALAZINE (APRESOLINE) 100 MG tablet Take 1 tablet (100 mg total) by mouth 3 (three) times daily. 09/27/14  Yes Rhonda G Barrett, PA-C  potassium chloride SA (K-DUR,KLOR-CON) 20 MEQ tablet Take 1 tablet (20 mEq total) by mouth daily. 09/27/14  Yes Rhonda G Barrett, PA-C  amLODipine (NORVASC) 5 MG tablet Take 1 tablet (5 mg total) by mouth daily. Patient not taking: Reported on 10/29/2014 10/03/14   Conrad Anderson, NP   Past Medical History  Diagnosis Date  . History of pneumonia   . Nonischemic cardiomyopathy     Normal coronaries May 2012, LVEF < 20%  . Chronic systolic heart failure [id number]  . CKD (chronic kidney disease) stage 2, GFR 60-89 ml/min   . Essential hypertension, benign   . Noncompliance   . Mitral regurgitation     Moderate  . CHF  (congestive heart failure)    Past Surgical History  Procedure Laterality Date  . Surgery scrotal / testicular      Testicular torsion   Family History  Problem Relation Age of Onset  . Breast cancer Mother   . Stroke Father    History   Social History  . Marital Status: Single    Spouse Name: N/A  . Number of Children: 0  . Years of Education: N/A   Occupational History  . Not on file.   Social History Main Topics  . Smoking status: Never Smoker   . Smokeless tobacco: Never Used  . Alcohol Use: Yes     Comment: occasionally  . Drug Use: No  . Sexual Activity: Yes    Birth Control/ Protection: Condom   Other Topics Concern  . Not on file   Social History Narrative   Occupation: just started sanitation job cleaning   ROS-see HPI   Negative unless "+" Constitutional:    weight loss, night sweats, fevers, chills, fatigue, lassitude. HEENT:    headaches, difficulty swallowing, tooth/dental problems, sore throat,       sneezing, itching, ear ache, nasal congestion, post nasal drip, snoring CV:    +chest pain, orthopnea, PND, +swelling in lower extremities, anasarca,                                  Dizziness,+ palpitations Resp:   +  shortness of breath with exertion or at rest.                productive cough,   non-productive cough, coughing up of blood.              change in color of mucus.  wheezing.   Skin:    rash or lesions. GI:  No-   heartburn, indigestion, abdominal pain, nausea, vomiting, diarrhea,                 change in bowel habits, loss of appetite GU: dysuria, change in color of urine, no urgency or frequency.   flank pain. MS:   joint pain, stiffness, decreased range of motion, back pain. Neuro-     nothing unusual Psych:  change in mood or affect.  +depression or +anxiety.   memory loss.  OBJ- Physical Exam General- + looks tired, Oriented, Affect-appropriate, Distress- none acute, + not obese Skin- rash-none, lesions- none, excoriation-  none Lymphadenopathy- none Head- atraumatic            Eyes- Gross vision intact, PERRLA, conjunctivae and secretions clear            Ears- Hearing, canals-normal            Nose- Clear, no-Septal dev, mucus, polyps, erosion, perforation             Throat- Mallampati II , mucosa clear , drainage- none, tonsils- atrophic Neck- flexible , trachea midline, no stridor , thyroid nl, carotid no bruit Chest - symmetrical excursion , unlabored           Heart/CV- RRR , no murmur , no gallop  , no rub, nl s1 s2                           - JVD- none , edema- none, stasis changes- none, varices- none           Lung- clear to P&A, wheeze- none, cough- none , dullness-none, rub- none           Chest wall-  Abd-  Br/ Gen/ Rectal- Not done, not indicated Extrem- cyanosis- none, clubbing, none, atrophy- none, strength- nl Neuro- grossly intact to observation

## 2014-10-29 NOTE — Patient Instructions (Signed)
Order Schedule Polysomnogram ( may be a daytime study to fit his work schedule)   Dx OSA, Hypertensive cardiomyopathy

## 2014-10-30 DIAGNOSIS — G4726 Circadian rhythm sleep disorder, shift work type: Secondary | ICD-10-CM | POA: Insufficient documentation

## 2014-10-30 DIAGNOSIS — G4733 Obstructive sleep apnea (adult) (pediatric): Secondary | ICD-10-CM | POA: Insufficient documentation

## 2014-10-30 NOTE — Assessment & Plan Note (Signed)
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

## 2014-10-30 NOTE — Assessment & Plan Note (Signed)
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 Plan-counseled on good sleep habits and adequate sleep. Schedule sleep study.

## 2014-11-20 ENCOUNTER — Ambulatory Visit (HOSPITAL_BASED_OUTPATIENT_CLINIC_OR_DEPARTMENT_OTHER): Payer: BLUE CROSS/BLUE SHIELD | Attending: Internal Medicine

## 2014-11-22 ENCOUNTER — Ambulatory Visit (HOSPITAL_COMMUNITY)
Admission: RE | Admit: 2014-11-22 | Discharge: 2014-11-22 | Disposition: A | Discharge status: Home / Self Care | Payer: BLUE CROSS/BLUE SHIELD | Source: Ambulatory Visit | Attending: Internal Medicine | Admitting: Internal Medicine

## 2014-11-22 VITALS — BP 162/108 | HR 79 | Wt 208.4 lb

## 2014-11-22 DIAGNOSIS — I5022 Chronic systolic (congestive) heart failure: Secondary | ICD-10-CM | POA: Insufficient documentation

## 2014-11-22 DIAGNOSIS — I10 Essential (primary) hypertension: Secondary | ICD-10-CM | POA: Insufficient documentation

## 2014-11-22 DIAGNOSIS — Z79899 Other long term (current) drug therapy: Secondary | ICD-10-CM | POA: Insufficient documentation

## 2014-11-22 DIAGNOSIS — N183 Chronic kidney disease, stage 3 (moderate): Secondary | ICD-10-CM

## 2014-11-22 DIAGNOSIS — I5042 Chronic combined systolic (congestive) and diastolic (congestive) heart failure: Secondary | ICD-10-CM | POA: Diagnosis not present

## 2014-11-22 DIAGNOSIS — D751 Secondary polycythemia: Secondary | ICD-10-CM | POA: Insufficient documentation

## 2014-11-22 DIAGNOSIS — N182 Chronic kidney disease, stage 2 (mild): Secondary | ICD-10-CM | POA: Insufficient documentation

## 2014-11-22 DIAGNOSIS — R0683 Snoring: Secondary | ICD-10-CM | POA: Insufficient documentation

## 2014-11-22 DIAGNOSIS — I129 Hypertensive chronic kidney disease with stage 1 through stage 4 chronic kidney disease, or unspecified chronic kidney disease: Secondary | ICD-10-CM | POA: Diagnosis not present

## 2014-11-22 MED ORDER — SPIRONOLACTONE 25 MG PO TABS: 12.5000 mg | ORAL_TABLET | Freq: Every day | ORAL | Status: DC

## 2014-11-22 NOTE — Progress Notes (Signed)
Patient ID: [name] [name], male   DOB: [birth year], 34 y.o.   MRN: [id number]  Primary PCP: [place]  Primary Cardiologist: Dr. Haroldine [name]  HPI: [name] [name] is a 34 y.o.male with known history of NICM, hypertension and CKD stage II (baseline Cr 1.5-1.7).   He was first diagnosed with HF in 2012 and his EF recovered and was instructed to stop taking carvedilol, spironolactone, and lasix by previous cardiologist. Admitted to [facility] [id number] for acute systolic heart failure. 12/31/11 ProBNP 3777. HIV nonreactive. Thyroid panel normal. Renal ultrasound was negative for obstruction. EF 20%. Discharge weight 193 lbs.   He returns for follow up. Feels ok. At last visit we wrote for norvasc 5 due to ongoing HTN. Has also not restarted spiro.  Has not filled yet  Denies SOB/PND/Orthopnea. SBP at home 150s. Weight at home 200-203 pounds. Works full time at [name] on third shirt. Taking all other meds. Misses hydralazine at night sometimes.. Saw Dr. Gwenette [name] and has sleep study scheduled.    ECHO 06/13/13: EF 20-25% ECHO 09/26/2014: EF 40%   06/14/13: K 3.6 Cr 2.00 06/27/14 K 3.7 Creatinine 1.42  09/27/2014: K 3.6 Creatinine 1.76  10/03/14: K 3.9 Creatinine 1.99   ROS: All systems negative except as listed in HPI, PMH and Problem List.  Past Medical History  Diagnosis Date  . History of pneumonia   . Nonischemic cardiomyopathy     Normal coronaries May 2012, LVEF < 20%  . Chronic systolic heart failure [id number]  . CKD (chronic kidney disease) stage 2, GFR 60-89 ml/min   . Essential hypertension, benign   . Noncompliance   . Mitral regurgitation     Moderate  . CHF (congestive heart failure)     Current Outpatient Prescriptions  Medication Sig Dispense Refill  . acetaminophen (TYLENOL) 500 MG tablet Take 500 mg by mouth every 6 (six) hours as needed.    . carvedilol (COREG) 25 MG tablet Take 1 tablet (25 mg total) by mouth 2 (two) times daily with a meal. 60 tablet 3  . furosemide (LASIX)  40 MG tablet Take 1 tablet (40 mg total) by mouth daily. 30 tablet 3  . hydrALAZINE (APRESOLINE) 100 MG tablet Take 1 tablet (100 mg total) by mouth 3 (three) times daily. 90 tablet 3  . potassium chloride SA (K-DUR,KLOR-CON) 20 MEQ tablet Take 1 tablet (20 mEq total) by mouth daily. 30 tablet 3   No current facility-administered medications for this encounter.      Filed Vitals:   11/22/14 1417  BP: 162/108  Pulse: 79  Weight: 208 lb 6.4 oz (94.53 kg)  SpO2: 96%    PHYSICAL EXAM: General:  Well appearing. No resp difficulty  HEENT: normal Neck: supple. JVP 5-6 . Carotids 2+ bilaterally; no bruits. No lymphadenopathy or thryomegaly appreciated. Cor: PMI normal. Regular rate & rhythm. No rubs, gallops or murmurs. Lungs: clear Abdomen: soft, nontender, nondistended. No hepatosplenomegaly. No bruits or masses. Good bowel sounds. Extremities: no cyanosis, clubbing, rash, edema Neuro: alert & orientedx3, cranial nerves grossly intact. Moves all 4 extremities w/o difficulty. Affect pleasant.   ASSESSMENT & PLAN:  1) Chronic systolic HF: NICM, EF 0000000 (06/2013)-->ECHO EF 40%  -- Currently NYHA I - early II Volume status stable. Continue lasix 40 mg daily  Continue hydralazine 100 mg three times a day.  He is not Imdur due to headaches. He is not on Ace due to elevated creatinine.  Reinforced medication compliance, limiting fluid intake to < 2 liters per day,  and daily weights.  2) HTN : Elevated but he has not been taking norvasc or spironolactone. Will restart spiro 12.5 daily. Stop potassium. I have reinforced medication compliance.  3) CKD stage II:  (baseline Cr 1.5-1.7) Stable. Stressed need for BP control to reduce risk of progression of CKD. Will set him up with [facility]. 4) Snoring: Has mild polycythemia. Likely has OSA. Saw Dr. Gwenette [name]. Pending sleep study which he missed. He will reschedule .  [name] Weyrauch,MD 2:38 PM

## 2014-11-22 NOTE — Addendum Note (Signed)
Encounter addended by: Kerry Dory, CMA on: 11/22/2014  3:05 PM<BR>     Documentation filed: Medications, Dx Association, Patient Instructions Section, Orders

## 2014-11-22 NOTE — Patient Instructions (Signed)
STOP Potassium START Spironolactone 12.5mg  daily (1/2 tab daily)  Your physician has requested that you regularly monitor and record your blood pressure readings at home. Please use the same machine at the same time of day to check your readings and record them to bring to your follow-up visit. If you SBP readings are more than 135, we will start Amlodipine. Please give [place] a call in 2 weeks with your blood pressure log  You have been referred to [facility], they will contact you once they have an appointment.  Labs needed in one week  Your physician recommends that you schedule a follow-up appointment in: 2 months  Do the following things EVERYDAY: 1) Weigh yourself in the morning before breakfast. Write it down and keep it in a log. 2) Take your medicines as prescribed 3) Eat low salt foods-Limit salt (sodium) to 2000 mg per day.  4) Stay as active as you can everyday 5) Limit all fluids for the day to less than 2 liters 6)

## 2014-11-29 ENCOUNTER — Ambulatory Visit (HOSPITAL_COMMUNITY)
Admission: RE | Admit: 2014-11-29 | Discharge: 2014-11-29 | Disposition: A | Discharge status: Home / Self Care | Payer: BLUE CROSS/BLUE SHIELD | Source: Ambulatory Visit | Attending: Internal Medicine | Admitting: Internal Medicine

## 2014-11-29 DIAGNOSIS — I5023 Acute on chronic systolic (congestive) heart failure: Secondary | ICD-10-CM | POA: Diagnosis not present

## 2014-11-29 DIAGNOSIS — I5022 Chronic systolic (congestive) heart failure: Secondary | ICD-10-CM

## 2014-11-29 LAB — BASIC METABOLIC PANEL
ANION GAP: 9 (ref 5–15)
BUN: 26 mg/dL — AB (ref 6–20)
CO2: 23 mmol/L (ref 22–32)
CREATININE: 1.97 mg/dL — AB (ref 0.61–1.24)
Calcium: 9.1 mg/dL (ref 8.9–10.3)
Chloride: 108 mmol/L (ref 101–111)
GFR calc Af Amer: 50 mL/min — ABNORMAL LOW (ref >60)
GFR calc non Af Amer: 43 mL/min — ABNORMAL LOW (ref >60)
Glucose, Bld: 89 mg/dL (ref 65–99)
Potassium: 3.8 mmol/L (ref 3.5–5.1)
Sodium: 140 mmol/L (ref 135–145)

## 2014-12-04 ENCOUNTER — Ambulatory Visit: Payer: BLUE CROSS/BLUE SHIELD | Admitting: Internal Medicine

## 2014-12-19 ENCOUNTER — Ambulatory Visit (HOSPITAL_BASED_OUTPATIENT_CLINIC_OR_DEPARTMENT_OTHER): Payer: BLUE CROSS/BLUE SHIELD

## 2014-12-28 ENCOUNTER — Telehealth: Payer: Self-pay

## 2014-12-28 NOTE — Telephone Encounter (Signed)
Tried to call pt. Unable to LM. Will try later.

## 2014-12-28 NOTE — Telephone Encounter (Signed)
Pt is scheduled on Monday for an ov with CY to follow up on his sleep study scheduled for 12/19/14.  Per Epic, pt r/s'ed sleep study to 02/18/15-pt will need to r/s his appt to 2 weeks after sleep study per CY, unless pt has another need to be seen on Monday. ATC pt X2, line rang and VM was not set up to leave a VM.   Forwarding to Triage to try again later today.

## 2014-12-31 ENCOUNTER — Ambulatory Visit: Payer: BLUE CROSS/BLUE SHIELD | Admitting: Internal Medicine

## 2015-01-01 ENCOUNTER — Telehealth (HOSPITAL_COMMUNITY): Payer: Self-pay

## 2015-01-01 NOTE — Telephone Encounter (Signed)
Sharyn Lull with [facility] called to inform us that patient did not show up for his new patient referral appointment last Monday 12/24/14.  Will let providers know.  Renee Pain

## 2015-02-13 ENCOUNTER — Encounter (HOSPITAL_BASED_OUTPATIENT_CLINIC_OR_DEPARTMENT_OTHER): Payer: Self-pay | Admitting: *Deleted

## 2015-02-18 ENCOUNTER — Encounter (HOSPITAL_BASED_OUTPATIENT_CLINIC_OR_DEPARTMENT_OTHER): Payer: BLUE CROSS/BLUE SHIELD

## 2015-03-04 ENCOUNTER — Ambulatory Visit (HOSPITAL_BASED_OUTPATIENT_CLINIC_OR_DEPARTMENT_OTHER): Payer: BLUE CROSS/BLUE SHIELD | Attending: Internal Medicine

## 2015-03-12 ENCOUNTER — Ambulatory Visit: Payer: BLUE CROSS/BLUE SHIELD | Admitting: Internal Medicine

## 2015-05-27 ENCOUNTER — Emergency Department (HOSPITAL_COMMUNITY)
Admission: EM | Admit: 2015-05-27 | Discharge: 2015-05-27 | Disposition: A | Discharge status: Home / Self Care | Payer: BLUE CROSS/BLUE SHIELD | Attending: Emergency Medicine | Admitting: Emergency Medicine

## 2015-05-27 DIAGNOSIS — K0889 Other specified disorders of teeth and supporting structures: Secondary | ICD-10-CM

## 2015-05-27 DIAGNOSIS — I10 Essential (primary) hypertension: Secondary | ICD-10-CM

## 2015-05-27 DIAGNOSIS — K029 Dental caries, unspecified: Secondary | ICD-10-CM | POA: Insufficient documentation

## 2015-05-27 DIAGNOSIS — I5022 Chronic systolic (congestive) heart failure: Secondary | ICD-10-CM | POA: Insufficient documentation

## 2015-05-27 DIAGNOSIS — I129 Hypertensive chronic kidney disease with stage 1 through stage 4 chronic kidney disease, or unspecified chronic kidney disease: Secondary | ICD-10-CM | POA: Insufficient documentation

## 2015-05-27 DIAGNOSIS — N182 Chronic kidney disease, stage 2 (mild): Secondary | ICD-10-CM | POA: Insufficient documentation

## 2015-05-27 DIAGNOSIS — Z9114 Patient's other noncompliance with medication regimen: Secondary | ICD-10-CM | POA: Insufficient documentation

## 2015-05-27 DIAGNOSIS — Z79899 Other long term (current) drug therapy: Secondary | ICD-10-CM | POA: Insufficient documentation

## 2015-05-27 DIAGNOSIS — Z8701 Personal history of pneumonia (recurrent): Secondary | ICD-10-CM | POA: Insufficient documentation

## 2015-05-27 MED ORDER — CLINDAMYCIN HCL 300 MG PO CAPS: ORAL_CAPSULE | ORAL | Status: DC

## 2015-05-27 MED ORDER — TRAMADOL HCL 50 MG PO TABS: 50.0000 mg | ORAL_TABLET | Freq: Four times a day (QID) | ORAL | Status: DC | PRN

## 2015-05-27 NOTE — Discharge Instructions (Signed)
It is important that you see a dentist systems possible. Please use clindamycin daily with food until all taken. Please use Tylenol or ibuprofen for mild pain. Use Ultram for more severe pain. This medication may cause drowsiness, please use with caution. Your blood pressure is elevated. Please see your primary MD concerning adjustments in medication as soon as possible. DASH Eating Plan DASH stands for "Dietary Approaches to Stop Hypertension." The DASH eating plan is a healthy eating plan that has been shown to reduce high blood pressure (hypertension). Additional health benefits may include reducing the risk of type 2 diabetes mellitus, heart disease, and stroke. The DASH eating plan may also help with weight loss. WHAT DO I NEED TO KNOW ABOUT THE DASH EATING PLAN? For the DASH eating plan, you will follow these general guidelines:  Choose foods with a percent daily value for sodium of less than 5% (as listed on the food label).  Use salt-free seasonings or herbs instead of table salt or sea salt.  Check with your health care provider or pharmacist before using salt substitutes.  Eat lower-sodium products, often labeled as "lower sodium" or "no salt added."  Eat fresh foods.  Eat more vegetables, fruits, and low-fat dairy products.  Choose whole grains. Look for the word "whole" as the first word in the ingredient list.  Choose fish and skinless chicken or [place] more often than red meat. Limit fish, poultry, and meat to 6 oz (170 g) each day.  Limit sweets, desserts, sugars, and sugary drinks.  Choose heart-healthy fats.  Limit cheese to 1 oz (28 g) per day.  Eat more home-cooked food and less restaurant, buffet, and fast food.  Limit fried foods.  Cook foods using methods other than frying.  Limit canned vegetables. If you do use them, rinse them well to decrease the sodium.  When eating at a restaurant, ask that your food be prepared with less salt, or no salt if  possible. WHAT FOODS CAN I EAT? Seek help from a dietitian for individual calorie needs. Grains Whole grain or whole wheat bread. Brown rice. Whole grain or whole wheat pasta. Quinoa, bulgur, and whole grain cereals. Low-sodium cereals. Corn or whole wheat flour tortillas. Whole grain cornbread. Whole grain crackers. Low-sodium crackers. Vegetables Fresh or frozen vegetables (raw, steamed, roasted, or grilled). Low-sodium or reduced-sodium tomato and vegetable juices. Low-sodium or reduced-sodium tomato sauce and paste. Low-sodium or reduced-sodium canned vegetables.  Fruits All fresh, canned (in natural juice), or frozen fruits. Meat and Other Protein Products Ground beef (85% or leaner), grass-fed beef, or beef trimmed of fat. Skinless chicken or [place]. Ground chicken or [place]. Pork trimmed of fat. All fish and seafood. Eggs. Dried beans, peas, or lentils. Unsalted nuts and seeds. Unsalted canned beans. Dairy Low-fat dairy products, such as skim or 1% milk, 2% or reduced-fat cheeses, low-fat ricotta or cottage cheese, or plain low-fat yogurt. Low-sodium or reduced-sodium cheeses. Fats and Oils Tub margarines without trans fats. Light or reduced-fat mayonnaise and salad dressings (reduced sodium). Avocado. Safflower, olive, or canola oils. Natural peanut or almond butter. Other Unsalted popcorn and pretzels. The items listed above may not be a complete list of recommended foods or beverages. Contact your dietitian for more options. WHAT FOODS ARE NOT RECOMMENDED? Grains White bread. White pasta. White rice. Refined cornbread. Bagels and croissants. Crackers that contain trans fat. Vegetables Creamed or fried vegetables. Vegetables in a cheese sauce. Regular canned vegetables. Regular canned tomato sauce and paste. Regular tomato and vegetable juices.  Fruits Dried fruits. Canned fruit in light or heavy syrup. Fruit juice. Meat and Other Protein Products Fatty cuts of meat. Ribs, chicken  wings, bacon, sausage, bologna, salami, chitterlings, fatback, hot dogs, bratwurst, and packaged luncheon meats. Salted nuts and seeds. Canned beans with salt. Dairy Whole or 2% milk, cream, half-and-half, and cream cheese. Whole-fat or sweetened yogurt. Full-fat cheeses or blue cheese. Nondairy creamers and whipped toppings. Processed cheese, cheese spreads, or cheese curds. Condiments Onion and garlic salt, seasoned salt, table salt, and sea salt. Canned and packaged gravies. Worcestershire sauce. Tartar sauce. Barbecue sauce. Teriyaki sauce. Soy sauce, including reduced sodium. Steak sauce. Fish sauce. Oyster sauce. Cocktail sauce. Horseradish. Ketchup and mustard. Meat flavorings and tenderizers. Bouillon cubes. Hot sauce. Tabasco sauce. Marinades. Taco seasonings. Relishes. Fats and Oils Butter, stick margarine, lard, shortening, ghee, and bacon fat. Coconut, palm kernel, or palm oils. Regular salad dressings. Other Pickles and olives. Salted popcorn and pretzels. The items listed above may not be a complete list of foods and beverages to avoid. Contact your dietitian for more information. WHERE CAN I FIND MORE INFORMATION? National Heart, Lung, and Blood Institute: travelstabloid.com   This information is not intended to replace advice given to you by your health care provider. Make sure you discuss any questions you have with your health care provider.   Document Released: 06/18/2011 Document Revised: 07/20/2014 Document Reviewed: 05/03/2013 Elsevier Interactive Patient Education [employer].

## 2015-05-27 NOTE — ED Notes (Signed)
Pt c/o upper left dental pain that started Saturday.

## 2015-05-27 NOTE — ED Provider Notes (Signed)
CSN: [id number]     Arrival date & time 05/27/15  1635 History  By signing my name below, I, [name] [name], attest that this documentation has been prepared under the direction and in the presence of [name], PA-C Electronically Signed: Erling [name], ED Scribe. 05/27/2015. 5:50 PM.    Chief Complaint  Patient presents with  . Dental Pain   Patient is a 34 y.o. male presenting with tooth pain. The history is provided by the patient. No language interpreter was used.  Dental Pain Location:  Upper Upper teeth location:  15/LU 2nd molar Severity:  Moderate Onset quality:  Gradual Timing:  Constant Progression:  Unchanged Chronicity:  New Context: dental caries   Relieved by:  None tried Worsened by:  Nothing tried Ineffective treatments:  None tried Associated symptoms: fever (subjective)   Risk factors: lack of dental care   Risk factors: no chewing tobacco use, no immunosuppression and no smoking     HPI Comments: [name] [name] is a 34 y.o. male with a h/o CHF, HTN, stage 2 CKD,  who presents to the Emergency Department complaining of constant, moderate, left, upper dental pain onset 3 days. He states he has had associated subjective fever and chills. He reports the pain is exacerbated with chewing food on his left side. Pt has not had any treatments that provided significant relief for his pain. He states he attempted to make an appt with a dentist for the tooth but was unable to get in contact to make an appt. Pt denies any h/o smoking or tobacco use. Pt maintains he is able to control his secretions at this time. He denies any trouble swallowing, drooling or trismus. He denies any known medication allergies.   Past Medical History  Diagnosis Date  . History of pneumonia   . Nonischemic cardiomyopathy ([place])     Normal coronaries May 2012, LVEF < 20%  . Chronic systolic heart failure [id number]  . CKD (chronic kidney disease) stage 2, GFR 60-89 ml/min   . Essential  hypertension, benign   . Noncompliance   . Mitral regurgitation     Moderate  . CHF (congestive heart failure)    Past Surgical History  Procedure Laterality Date  . Surgery scrotal / testicular      Testicular torsion   Family History  Problem Relation Age of Onset  . Breast cancer Mother   . Stroke Father    Social History  Substance Use Topics  . Smoking status: Never Smoker   . Smokeless tobacco: Never Used  . Alcohol Use: Yes     Comment: occasionally    Review of Systems  Constitutional: Positive for fever (subjective) and chills (subjective).  HENT: Positive for dental problem.   All other systems reviewed and are negative.     Allergies  Review of patient's allergies indicates no known allergies.  Home Medications   Prior to Admission medications   Medication Sig Start Date End Date Taking? Authorizing Provider  acetaminophen (TYLENOL) 500 MG tablet Take 500 mg by mouth every 6 (six) hours as needed.    Historical Provider, MD  carvedilol (COREG) 25 MG tablet Take 1 tablet (25 mg total) by mouth 2 (two) times daily with a meal. 09/27/14   Rhonda G Barrett, PA-C  furosemide (LASIX) 40 MG tablet Take 1 tablet (40 mg total) by mouth daily. 09/27/14   Rhonda G Barrett, PA-C  hydrALAZINE (APRESOLINE) 100 MG tablet Take 1 tablet (100 mg total) by mouth 3 (three)  times daily. 09/27/14   Rhonda G Barrett, PA-C  spironolactone (ALDACTONE) 25 MG tablet Take 0.5 tablets (12.5 mg total) by mouth daily. 11/22/14   Jolaine Artist, MD   Triage Vitals: BP 179/123 mmHg  Pulse 94  Temp(Src) 98.3 F (36.8 C) (Oral)  Resp 28  Ht 5\' 10"  (1.778 m)  Wt 205 lb (92.987 kg)  BMI 29.41 kg/m2  SpO2 98%  Physical Exam  Constitutional: He is oriented to person, place, and time. He appears well-developed and well-nourished. No distress.  HENT:  Head: Normocephalic and atraumatic.  Mouth/Throat: Uvula is midline and oropharynx is clear and moist. No trismus in the jaw. Dental  caries present. No dental abscesses.  Deep cavity of the left upper molar, increased swelling at the gum. No visible abscess  Eyes: Conjunctivae and EOM are normal.  Neck: Neck supple. No tracheal deviation present.  Cardiovascular: Normal rate.   Pulmonary/Chest: Effort normal. No respiratory distress.  Musculoskeletal: Normal range of motion.  Lymphadenopathy:    He has no cervical adenopathy.  Neurological: He is alert and oriented to person, place, and time.  Skin: Skin is warm and dry.  Psychiatric: He has a normal mood and affect. His behavior is normal.  Nursing note and vitals reviewed.   ED Course  Procedures (including critical care time)  DIAGNOSTIC STUDIES: Oxygen Saturation is 98% on RA, normal by my interpretation.    COORDINATION OF CARE: 6:05 PM- Will give rx for Clindamycin and Ultram for mgmt of pain. Recommended dental appt as soon as possible.  Pt advised of plan for treatment and pt agrees.   Labs Review Labs Reviewed - No data to display  Imaging Review No results found.   EKG Interpretation None      MDM  Pt advised of b/p elevation. He is to follow up with PCP or clinic concerning this. Rx for clindamycin and ultram given. No evidence for trismus or Ludwig's angina noted. Pt strongly encouraged to see a dentist as soon as possible.   Final diagnoses:  Pain, dental  Essential hypertension    **I have reviewed nursing notes, vital signs, and all appropriate lab and imaging results for this patient.* **I personally performed the services described in this documentation, which was scribed in my presence. The recorded information has been reviewed and is accurate.[name], PA-C 05/29/15 [place], MD 05/31/15 [phone]

## 2015-05-30 ENCOUNTER — Emergency Department (HOSPITAL_COMMUNITY): Payer: Self-pay

## 2015-05-30 ENCOUNTER — Inpatient Hospital Stay (HOSPITAL_COMMUNITY)
Admission: EM | Admit: 2015-05-30 | Discharge: 2015-06-01 | DRG: 305 | Disposition: A | Discharge status: Home / Self Care | Payer: Self-pay | Attending: Internal Medicine | Admitting: Internal Medicine

## 2015-05-30 ENCOUNTER — Encounter (HOSPITAL_COMMUNITY): Payer: Self-pay

## 2015-05-30 DIAGNOSIS — Z8701 Personal history of pneumonia (recurrent): Secondary | ICD-10-CM

## 2015-05-30 DIAGNOSIS — I161 Hypertensive emergency: Secondary | ICD-10-CM | POA: Diagnosis present

## 2015-05-30 DIAGNOSIS — R7989 Other specified abnormal findings of blood chemistry: Secondary | ICD-10-CM

## 2015-05-30 DIAGNOSIS — I160 Hypertensive urgency: Principal | ICD-10-CM | POA: Diagnosis present

## 2015-05-30 DIAGNOSIS — I248 Other forms of acute ischemic heart disease: Secondary | ICD-10-CM | POA: Diagnosis present

## 2015-05-30 DIAGNOSIS — I5022 Chronic systolic (congestive) heart failure: Secondary | ICD-10-CM | POA: Insufficient documentation

## 2015-05-30 DIAGNOSIS — I428 Other cardiomyopathies: Secondary | ICD-10-CM

## 2015-05-30 DIAGNOSIS — I130 Hypertensive heart and chronic kidney disease with heart failure and stage 1 through stage 4 chronic kidney disease, or unspecified chronic kidney disease: Secondary | ICD-10-CM | POA: Diagnosis present

## 2015-05-30 DIAGNOSIS — N183 Chronic kidney disease, stage 3 unspecified: Secondary | ICD-10-CM | POA: Diagnosis present

## 2015-05-30 DIAGNOSIS — E876 Hypokalemia: Secondary | ICD-10-CM | POA: Diagnosis present

## 2015-05-30 DIAGNOSIS — I429 Cardiomyopathy, unspecified: Secondary | ICD-10-CM | POA: Diagnosis present

## 2015-05-30 DIAGNOSIS — Z9114 Patient's other noncompliance with medication regimen: Secondary | ICD-10-CM

## 2015-05-30 DIAGNOSIS — I5042 Chronic combined systolic (congestive) and diastolic (congestive) heart failure: Secondary | ICD-10-CM | POA: Diagnosis present

## 2015-05-30 DIAGNOSIS — Z803 Family history of malignant neoplasm of breast: Secondary | ICD-10-CM

## 2015-05-30 DIAGNOSIS — R778 Other specified abnormalities of plasma proteins: Secondary | ICD-10-CM

## 2015-05-30 DIAGNOSIS — Z823 Family history of stroke: Secondary | ICD-10-CM

## 2015-05-30 LAB — CBC WITH DIFFERENTIAL/PLATELET
BASOS ABS: 0 10*3/uL (ref 0.0–0.1)
Basophils Relative: 0 %
EOS PCT: 1 %
Eosinophils Absolute: 0.1 10*3/uL (ref 0.0–0.7)
HCT: 38.1 % — ABNORMAL LOW (ref 39.0–52.0)
Hemoglobin: 12.7 g/dL — ABNORMAL LOW (ref 13.0–17.0)
Lymphocytes Relative: 13 %
Lymphs Abs: 1.4 10*3/uL (ref 0.7–4.0)
MCH: 25.8 pg — ABNORMAL LOW (ref 26.0–34.0)
MCHC: 33.3 g/dL (ref 30.0–36.0)
MCV: 77.4 fL — AB (ref 78.0–100.0)
MONO ABS: 0.5 10*3/uL (ref 0.1–1.0)
Monocytes Relative: 5 %
Neutro Abs: 8.8 10*3/uL — ABNORMAL HIGH (ref 1.7–7.7)
Neutrophils Relative %: 81 %
PLATELETS: 232 10*3/uL (ref 150–400)
RBC: 4.92 MIL/uL (ref 4.22–5.81)
RDW: 16 % — AB (ref 11.5–15.5)
WBC: 10.8 10*3/uL — ABNORMAL HIGH (ref 4.0–10.5)

## 2015-05-30 LAB — COMPREHENSIVE METABOLIC PANEL
ALT: 22 U/L (ref 17–63)
AST: 26 U/L (ref 15–41)
Albumin: 4 g/dL (ref 3.5–5.0)
Alkaline Phosphatase: 50 U/L (ref 38–126)
Anion gap: 11 (ref 5–15)
BUN: 21 mg/dL — ABNORMAL HIGH (ref 6–20)
CO2: 20 mmol/L — ABNORMAL LOW (ref 22–32)
Calcium: 8.6 mg/dL — ABNORMAL LOW (ref 8.9–10.3)
Chloride: 105 mmol/L (ref 101–111)
Creatinine, Ser: 1.83 mg/dL — ABNORMAL HIGH (ref 0.61–1.24)
GFR calc Af Amer: 54 mL/min — ABNORMAL LOW (ref >60)
GFR calc non Af Amer: 47 mL/min — ABNORMAL LOW (ref >60)
Glucose, Bld: 109 mg/dL — ABNORMAL HIGH (ref 65–99)
Potassium: 3.6 mmol/L (ref 3.5–5.1)
Sodium: 136 mmol/L (ref 135–145)
Total Bilirubin: 1.3 mg/dL — ABNORMAL HIGH (ref 0.3–1.2)
Total Protein: 7.6 g/dL (ref 6.5–8.1)

## 2015-05-30 LAB — BRAIN NATRIURETIC PEPTIDE: B Natriuretic Peptide: 2192 pg/mL — ABNORMAL HIGH (ref 0.0–100.0)

## 2015-05-30 LAB — LIPASE, BLOOD: Lipase: 18 U/L (ref 11–51)

## 2015-05-30 LAB — TROPONIN I
TROPONIN I: 2.63 ng/mL — AB (ref <0.031)
TROPONIN I: 1.91 ng/mL — AB (ref <0.031)
Troponin I: 2.38 ng/mL (ref <0.031)

## 2015-05-30 MED ORDER — ACETAMINOPHEN 500 MG PO TABS: 500.0000 mg | ORAL_TABLET | Freq: Four times a day (QID) | ORAL | Status: DC | PRN

## 2015-05-30 MED ORDER — CARVEDILOL 12.5 MG PO TABS
25.0000 mg | ORAL_TABLET | Freq: Two times a day (BID) | ORAL | Status: DC
  Administered 2015-05-30 – 2015-06-01 (×4): 25 mg via ORAL
  Filled 2015-05-30 (×4): qty 2

## 2015-05-30 MED ORDER — HYDRALAZINE HCL 25 MG PO TABS
100.0000 mg | ORAL_TABLET | Freq: Three times a day (TID) | ORAL | Status: DC
  Administered 2015-05-30 – 2015-06-01 (×7): 100 mg via ORAL
  Filled 2015-05-30 (×10): qty 4

## 2015-05-30 MED ORDER — NITROGLYCERIN IN D5W 200-5 MCG/ML-% IV SOLN
5.0000 ug/min | INTRAVENOUS | Status: DC
Start: 2015-05-30 — End: 2015-05-30
  Filled 2015-05-30: qty 250

## 2015-05-30 MED ORDER — ONDANSETRON HCL 4 MG/2ML IJ SOLN: 4.0000 mg | Freq: Four times a day (QID) | INTRAMUSCULAR | Status: DC | PRN

## 2015-05-30 MED ORDER — SPIRONOLACTONE 12.5 MG HALF TABLET: 12.5000 mg | ORAL_TABLET | Freq: Two times a day (BID) | ORAL | Status: DC

## 2015-05-30 MED ORDER — HYDRALAZINE HCL 20 MG/ML IJ SOLN
10.0000 mg | Freq: Once | INTRAMUSCULAR | Status: AC
  Administered 2015-05-30: 10 mg via INTRAVENOUS
  Filled 2015-05-30: qty 1

## 2015-05-30 MED ORDER — CARVEDILOL 12.5 MG PO TABS
25.0000 mg | ORAL_TABLET | Freq: Once | ORAL | Status: AC
  Administered 2015-05-30: 25 mg via ORAL
  Filled 2015-05-30: qty 2

## 2015-05-30 MED ORDER — SPIRONOLACTONE 25 MG PO TABS
12.5000 mg | ORAL_TABLET | Freq: Every day | ORAL | Status: DC
  Administered 2015-05-30 – 2015-06-01 (×3): 12.5 mg via ORAL
  Filled 2015-05-30: qty 1
  Filled 2015-05-30 (×2): qty 0.5
  Filled 2015-05-30: qty 1

## 2015-05-30 MED ORDER — FUROSEMIDE 40 MG PO TABS
40.0000 mg | ORAL_TABLET | Freq: Once | ORAL | Status: AC
  Administered 2015-05-30: 40 mg via ORAL
  Filled 2015-05-30: qty 1

## 2015-05-30 MED ORDER — FUROSEMIDE 40 MG PO TABS
40.0000 mg | ORAL_TABLET | Freq: Every day | ORAL | Status: DC
  Administered 2015-05-30 – 2015-06-01 (×3): 40 mg via ORAL
  Filled 2015-05-30 (×3): qty 1

## 2015-05-30 MED ORDER — HYDRALAZINE HCL 25 MG PO TABS
100.0000 mg | ORAL_TABLET | Freq: Once | ORAL | Status: AC
  Administered 2015-05-30: 100 mg via ORAL
  Filled 2015-05-30: qty 4

## 2015-05-30 MED ORDER — ENOXAPARIN SODIUM 30 MG/0.3ML ~~LOC~~ SOLN
30.0000 mg | SUBCUTANEOUS | Status: DC
  Administered 2015-05-30: 30 mg via SUBCUTANEOUS
  Filled 2015-05-30: qty 0.3

## 2015-05-30 MED ORDER — SODIUM CHLORIDE 0.9 % IJ SOLN
3.0000 mL | Freq: Two times a day (BID) | INTRAMUSCULAR | Status: DC
  Administered 2015-05-30 – 2015-06-01 (×4): 3 mL via INTRAVENOUS

## 2015-05-30 MED ORDER — ONDANSETRON HCL 4 MG PO TABS: 4.0000 mg | ORAL_TABLET | Freq: Four times a day (QID) | ORAL | Status: DC | PRN

## 2015-05-30 NOTE — ED Notes (Signed)
Patient's BP has decreased to 154/99 with HR of 93. Dr. Vanita Panda informed.  Hold Nitro for now per Dr. Vanita Panda.

## 2015-05-30 NOTE — ED Notes (Signed)
Critical lab:  Troponin 2.63  Dr. Vanita Panda informed.

## 2015-05-30 NOTE — ED Provider Notes (Signed)
CSN: [id number]     Arrival date & time 05/30/15  1017 History  By signing my name below, I, Stephania Fragmin, attest that this documentation has been prepared under the direction and in the presence of Carmin Muskrat, MD. Electronically Signed: Stephania Fragmin, ED Scribe. 05/30/2015. 12:25 PM.   Chief Complaint  Patient presents with  . Shortness of Breath   The history is provided by the patient. No language interpreter was used.    HPI Comments: [name] [name] is a 34 y.o. male with a history of nonischemic cardiomyopathy, chronic systolic heart failure, CKD, and essential HTN, who presents to the Emergency Department complaining of recurrent SOB that began 3-4 days ago. He also notes a "little" discomfort in his left ribcage that does not move. He states he has been having the same symptoms intermittently since 2012, occuring about twice a year. His doctor told him this was because he has not been compliant with his medications. He states he ran out of has not been taking hydralazine 2 weeks ago and furosemide 2-3 weeks ago. He still has Clobetalol. Patient states he has not seen his doctor since July but lost his insurance, so he has not been able to obtain medications refills. He denies any dietary changes, medications changes, or any new traveling. Patient used to work at [name] before being laid off. Patient lives in [place].  He denies weight gain, swelling, or confusion. PCP: [place]  Cardiology: [facility]ac[facility] Pont[facility]  Past Medical History  Diagnosis Date  . History of pneumonia   . Nonischemic cardiomyopathy ([place])     Normal coronaries May 2012, LVEF < 20%  . Chronic systolic heart failure ([place]) 12/31/2011  . CKD (chronic kidney disease) stage 2, GFR 60-89 ml/min   . Essential hypertension, benign   . Noncompliance   . Mitral regurgitation     Moderate  . CHF (congestive heart failure) [facility])    Past Surgical History  Procedure Laterality Date  . Surgery scrotal / testicular       Testicular torsion   Family History  Problem Relation Age of Onset  . Breast cancer Mother   . Stroke Father    Social History  Substance Use Topics  . Smoking status: Never Smoker   . Smokeless tobacco: Never Used  . Alcohol Use: Yes     Comment: occasionally    Review of Systems  Constitutional: Negative for unexpected weight change.       Per HPI, otherwise negative  HENT:       Per HPI, otherwise negative  Respiratory: Positive for shortness of breath.        Per HPI, otherwise negative  Cardiovascular: Positive for chest pain (discomfort in left ribcage). Negative for leg swelling.       Per HPI, otherwise negative  Gastrointestinal: Negative for vomiting.  Endocrine:       Negative aside from HPI  Genitourinary:       Neg aside from HPI   Musculoskeletal:       Per HPI, otherwise negative  Skin: Negative.   Neurological: Negative for syncope.  Psychiatric/Behavioral: Negative for confusion.      Allergies  Review of patient's allergies indicates no known allergies.  Home Medications   Prior to Admission medications   Medication Sig Start Date End Date Taking? Authorizing Provider  acetaminophen (TYLENOL) 500 MG tablet Take 500 mg by mouth every 6 (six) hours as needed.   Yes Historical Provider, MD  carvedilol (COREG) 25 MG  tablet Take 1 tablet (25 mg total) by mouth 2 (two) times [name] with a meal. 09/27/14  Yes Rhonda G Barrett, PA-C  furosemide (LASIX) 40 MG tablet Take 1 tablet (40 mg total) by mouth [name]. 09/27/14  Yes Rhonda G Barrett, PA-C  hydrALAZINE (APRESOLINE) 100 MG tablet Take 1 tablet (100 mg total) by mouth 3 (three) times [name]. 09/27/14  Yes Rhonda G Barrett, PA-C  clindamycin (CLEOCIN) 300 MG capsule 1 po tid with food Patient not taking: Reported on 05/30/2015 05/27/15   [name], PA-C  spironolactone (ALDACTONE) 25 MG tablet Take 0.5 tablets (12.5 mg total) by mouth [name]. Patient not taking: Reported on 05/30/2015 11/22/14   Jolaine Artist, MD  traMADol (ULTRAM) 50 MG tablet Take 1 tablet (50 mg total) by mouth every 6 (six) hours as needed. Patient not taking: Reported on 05/30/2015 05/27/15   [name], PA-C   BP 192/130 mmHg  Pulse 116  Temp(Src) 98.5 F (36.9 C) (Oral)  Resp 20  Ht 5\' 10"  (1.778 m)  Wt 200 lb (90.719 kg)  BMI 28.70 kg/m2  SpO2 97% Physical Exam  Constitutional: He is oriented to person, place, and time. He appears well-developed. No distress.  HENT:  Head: Normocephalic and atraumatic.  Eyes: Conjunctivae and EOM are normal.  Cardiovascular: Regular rhythm.  Tachycardia present.   Slight tachycardia.  Pulmonary/Chest: Effort normal. No stridor. Tachypnea noted. No respiratory distress. He has decreased breath sounds. He has no wheezes.  Diminished breath sounds. No wheezing.  Abdominal: He exhibits no distension.  Musculoskeletal: He exhibits no edema.  Neurological: He is alert and oriented to person, place, and time.  Skin: Skin is warm and dry.  Psychiatric: He has a normal mood and affect.  Nursing note and vitals reviewed.   ED Course  Procedures (including critical care time)  DIAGNOSTIC STUDIES: Oxygen Saturation is 96% on RA, normal by my interpretation.    COORDINATION OF CARE: 11:55 AM - Discussed treatment plan with pt at bedside which includes HTN medications, CXR, and blood tests. Pt verbalized understanding and agreed to plan.   Initial BP 183/138  Labs Review Labs Reviewed  COMPREHENSIVE METABOLIC PANEL - Abnormal; Notable for the following:    CO2 20 (*)    Glucose, Bld 109 (*)    BUN 21 (*)    Creatinine, Ser 1.83 (*)    Calcium 8.6 (*)    Total Bilirubin 1.3 (*)    GFR calc non Af Amer 47 (*)    GFR calc Af Amer 54 (*)    All other components within normal limits  BRAIN NATRIURETIC PEPTIDE - Abnormal; Notable for the following:    B Natriuretic Peptide 2192.0 (*)    All other components within normal limits  TROPONIN I - Abnormal; Notable for  the following:    Troponin I 2.63 (*)    All other components within normal limits  CBC WITH DIFFERENTIAL/PLATELET - Abnormal; Notable for the following:    WBC 10.8 (*)    Hemoglobin 12.7 (*)    HCT 38.1 (*)    MCV 77.4 (*)    MCH 25.8 (*)    RDW 16.0 (*)    Neutro Abs 8.8 (*)    All other components within normal limits  LIPASE, BLOOD    Imaging Review Dg Chest 2 View  05/30/2015  CLINICAL DATA:  Shortness of Breath EXAM: CHEST  2 VIEW COMPARISON:  09/25/2014 FINDINGS: Cardiomegaly again noted. No acute infiltrate or pleural effusion.  No pulmonary edema. Central mild bronchitic changes. Bony thorax is stable. IMPRESSION: No acute infiltrate or pulmonary edema. Cardiomegaly again noted. Central mild bronchitic changes. Electronically Signed   By: Lahoma Crocker M.D.   On: 05/30/2015 11:01   I have personally reviewed and evaluated these images and lab results as part of my medical decision-making.   EKG Interpretation   Date/Time:  Thursday May 30 2015 10:32:25 EST Ventricular Rate:  116 PR Interval:  164 QRS Duration: 99 QT Interval:  344 QTC Calculation: 478 R Axis:   66 Text Interpretation:  Sinus tachycardia LAE, consider biatrial enlargement  Left ventricular hypertrophy Nonspecific T abnormalities, lateral leads ST  elevation, consider anterior injury Borderline prolonged QT interval Sinus  tachycardia Left ventricular hypertrophy ST-t wave abnormality No  significant change since last tracing Abnormal ekg Confirmed by Carmin Muskrat  MD [phone]) on 05/30/2015 11:29:49 AM     Update: Patient's labs notable for elevated troponin.  Patient also was found to have BNP of greater than 2000. On repeat exam the patient remains hypertensive. Patient has received carvedilol, Lasix, hydralazine, IV and oral, spironolactone.   Chart review notable for the frustration of nonischemic cardiomyopathy, last catheterization 3 years ago, with no occlusive disease. Patient has  prior other visits with medication noncompliance, with similar episodes of dyspnea, chest pain.   Given the initial notable abnormalities, I discussed patient's case with our cardiology colleagues.   Update: Patient now appears substantially better, blood pressure 0000000 systolic.  I again discussed patient's case with our cardiology colleagues, and on repeat exam, the patient continues to improve, now blood pressure is [id number] systolic.  MDM   I personally performed the services described in this documentation, which was scribed in my presence. The recorded information has been reviewed and is accurate.    This patient with multiple medical issues, including nonischemic cardio myopathy, chronic systolic heart failure, hypertension now presents with respiratory distress, with ongoing chest pain. Given the patient's immediately endorsed medication noncompliance, there is concern for hypertensive emergency. Since initial blood pressure has systolic greater than [id number], diastolic greater than [id number], requiring immediate initiation of multiple antihypertensives, diuretic. Patient had substantial diuresis, reduction in symptoms, and respiratory distress. Blood pressure went to 140/90. Patient's case was discussed with our cardiology colleagues throughout, and given the evidence for end organ effects, with elevated BNP, troponin, the patient reported admission for further evaluation, further management of his hypertensive emergency.  CRITICAL CARE Performed by: Carmin Muskrat Total critical care time: 40 minutes Critical care time was exclusive of separately billable procedures and treating other patients. Critical care was necessary to treat or prevent imminent or life-threatening deterioration. Critical care was time spent personally by me on the following activities: development of treatment plan with patient and/or surrogate as well as nursing, discussions with consultants, evaluation of patient's  response to treatment, examination of patient, obtaining history from patient or surrogate, ordering and performing treatments and interventions, ordering and review of laboratory studies, ordering and review of radiographic studies, pulse oximetry and re-evaluation of patient's condition.   Carmin Muskrat, MD 05/30/15 [phone]

## 2015-05-30 NOTE — ED Notes (Signed)
Pt reports cold symptoms and sob  for past 4 or 5 days.

## 2015-05-30 NOTE — Consult Note (Addendum)
Primary cardiologist: Dr. Pierre Bali Consulting cardiologist: Dr. Satira Sark  Reason for consultation: Hypertensive urgency, CHF, abnormal troponin I  Clinical Summary Mr. Lebrecht is a 34 y.o.male with past medical history outlined below, now presenting to the ER complaining of increased shortness of breath and a feeling of tightness under his left rib cage that has been slowly getting worse since this past Sunday. He states that he has only been taking Coreg for management of his nonischemic cardiomyopathy over the last 2-3 weeks, having run out of all of his other medications and not refilling them. Coreg stopped on Tuesday. He presented to the ER significantly hypertensive and tachycardic. Chest x-ray showed cardiomegaly with some bronchitic changes but no obvious pulmonary edema. BNP level 2192 and initial troponin I 2.6. ECG shows sinus rhythm with LVH, LAE, and significant repolarization abnormalities.  He has a history of medication noncompliance based on chart review. Last visit with Dr. Haroldine Laws was in May of this year.  Last echocardiogram in March of this year showed LVEF of 40% with severe LVH and increased filling pressures, full report detailed below.  No Known Allergies  Home Medications Coreg 25 mg by mouth twice a day   Past Medical History  Diagnosis Date  . History of pneumonia   . Nonischemic cardiomyopathy ([place])     Normal coronaries May 2012, LVEF < 20%  . Chronic systolic heart failure ([place])   . CKD (chronic kidney disease) stage 2, GFR 60-89 ml/min   . Essential hypertension, benign   . Noncompliance   . Mitral regurgitation     Moderate    Past Surgical History  Procedure Laterality Date  . Surgery scrotal / testicular      Testicular torsion    Family History  Problem Relation Age of Onset  . Breast cancer Mother   . Stroke Father     Social History Mr. Leroy reports that he has never smoked. He has never used smokeless  tobacco. Mr. Miles reports that he drinks alcohol.  Review of Systems Complete review of systems negative except as otherwise outlined in the clinical summary and also the following. No leg edema, abdominal fullness, PND. He has had some orthopnea within the last few days. No palpitations or syncope.  Physical Examination Blood pressure 141/88, pulse 85, temperature 98.5 F (36.9 C), temperature source Oral, resp. rate 24, height 5\' 10"  (1.778 m), weight 200 lb (90.719 kg), SpO2 94 %.  Intake/Output Summary (Last 24 hours) at 05/30/15 1500 Last data filed at 05/30/15 1339  Gross per 24 hour  Intake      0 ml  Output    800 ml  Net   -800 ml   Telemetry: Sinus rhythm.  Gen.: Well-developed male in no distress. HEENT: Conjunctiva and lids normal, oropharynx clear. Neck: Supple, elevated JVP, no carotid bruits, no thyromegaly. Lungs: Clear to auscultation, nonlabored breathing at rest. Cardiac: Distinct PMI, regular rate and rhythm, S3, no systolic murmur, no pericardial rub. Abdomen: Soft, nontender, bowel sounds present, no guarding or rebound. Extremities: No pitting edema, distal pulses 2+. Skin: Warm and dry. Musculoskeletal: No kyphosis. Neuropsychiatric: Alert and oriented x3, affect grossly appropriate.  Lab Results  Basic Metabolic Panel:  Recent Labs Lab 05/30/15 1148  NA 136  K 3.6  CL 105  CO2 20*  GLUCOSE 109*  BUN 21*  CREATININE 1.83*  CALCIUM 8.6*    Liver Function Tests:  Recent Labs Lab 05/30/15 1148  AST 26  ALT  22  ALKPHOS 50  BILITOT 1.3*  PROT 7.6  ALBUMIN 4.0    CBC:  Recent Labs Lab 05/30/15 1148  WBC 10.8*  NEUTROABS 8.8*  HGB 12.7*  HCT 38.1*  MCV 77.4*  PLT 232    Cardiac Enzymes:  Recent Labs Lab 05/30/15 1148  TROPONINI 2.63*    BNP: 2192   Imaging  Echocardiogram 09/26/2014: Study Conclusions  - Left ventricle: Mild global hypokinesis. Moderate hypokinesis inferior wall. The cavity size was at  the upper limits of normal. Wall thickness was increased in a pattern of severe LVH. The estimated ejection fraction was 40%. Findings consistent with left ventricular diastolic dysfunction. Doppler parameters are consistent with high ventricular filling pressure. - Aortic valve: Sclerosis without stenosis. There was mild regurgitation. - Mitral valve: There was mild regurgitation. - Left atrium: The atrium was moderately dilated. - Right ventricle: The cavity size was normal. Systolic function was normal.  Impression  1. Hypertensive urgency associated with shortness of breath, abnormal troponin I level which at this point is suspected to be related to myocardial strain with known nonischemic cardiomyopathy, and known medication noncompliance. Baseline medical regimen is to include high-dose hydralazine, Coreg, Lasix, and Aldactone. He has only been taking Coreg for the last 2-3 weeks, up until this Tuesday when he has been on no medications. He was placed on nitroglycerin infusion in the ER, blood pressure is coming down. He is in no distress and feels better.  2. Nonischemic cardiomyopathy with prior documentation of normal coronaries as of May 2012. LVEF was most recently documented at 40% in March, in the past and been less than 20%. Typical medical regimen is reviewed above. He is not on ACE inhibitor with CKD, and did not tolerate nitrates long-term previously due to headaches.  3. History of recurring medication noncompliance.  4. Mitral regurgitation, mild by echocardiogram in March.  5. CKD, stage 3 creatinine 1.8.  Recommendations  Recommend hospital observation to reinstitute medical therapy and cycle cardiac markers. Doubt true ACS, but observing trend will be helpful. Not initiating heparin at this time - unless his markers further trend up significantly. Follow-up echocardiogram will be obtained, he may well have had a reduction in LVEF once again. Importance of  medication compliance was again stressed to the patient. Would resume Coreg 25 mg twice daily, hydralazine 100 mg 3 times daily, Lasix 40 mg daily, and Aldactone 12.5 mg daily. He will need to have close follow-up in the CHF clinic after discharge.  Satira Sark, M.D., F.A.C.C.

## 2015-05-30 NOTE — H&P (Signed)
[facility] History and Physical  [name] [name] [id number] DOB: 1981/01/11 DOA: 05/30/2015  Referring physician: ER PCP: Rocky Morel, MD   Chief Complaint: Dyspnea  HPI: [name] [name] is a 34 y.o. male  This is a 34 year old man who has a history of nonischemic cardiomyopathy and hypertension who now presents with approximately 1 week history of dyspnea. He also has a feeling of tightness in the left side of his chest. He has not been able to take all his medications because of finances. He discontinued the Coreg 2 days ago. He presented to the emergency room significantly hypertensive and tachycardic. After receiving hydralazine intravenously, his blood pressure has improved and he is also feeling better. Initial troponin is elevated. He has been seen by cardiology and they recommended admission.   Review of Systems:  Apart from symptoms above, all systems are negative.  Past Medical History  Diagnosis Date  . History of pneumonia   . Nonischemic cardiomyopathy ([place])     Normal coronaries May 2012, LVEF < 20%  . Chronic systolic heart failure ([place])   . CKD (chronic kidney disease) stage 2, GFR 60-89 ml/min   . Essential hypertension, benign   . Noncompliance   . Mitral regurgitation     Moderate   Past Surgical History  Procedure Laterality Date  . Surgery scrotal / testicular      Testicular torsion   Social History:  reports that he has never smoked. He has never used smokeless tobacco. He reports that he drinks alcohol. He reports that he does not use illicit drugs.  No Known Allergies  Family History  Problem Relation Age of Onset  . Breast cancer Mother   . Stroke Father     Prior to Admission medications   Medication Sig Start Date End Date Taking? Authorizing Provider  acetaminophen (TYLENOL) 500 MG tablet Take 500 mg by mouth every 6 (six) hours as needed.   Yes Historical Provider, MD  carvedilol (COREG) 25 MG tablet Take 1  tablet (25 mg total) by mouth 2 (two) times daily with a meal. 09/27/14  Yes Rhonda G Barrett, PA-C  furosemide (LASIX) 40 MG tablet Take 1 tablet (40 mg total) by mouth daily. 09/27/14  Yes Rhonda G Barrett, PA-C  hydrALAZINE (APRESOLINE) 100 MG tablet Take 1 tablet (100 mg total) by mouth 3 (three) times daily. 09/27/14  Yes Rhonda G Barrett, PA-C  clindamycin (CLEOCIN) 300 MG capsule 1 po tid with food Patient not taking: Reported on 05/30/2015 05/27/15   [name], PA-C  spironolactone (ALDACTONE) 25 MG tablet Take 0.5 tablets (12.5 mg total) by mouth daily. Patient not taking: Reported on 05/30/2015 11/22/14   Jolaine Artist, MD  traMADol (ULTRAM) 50 MG tablet Take 1 tablet (50 mg total) by mouth every 6 (six) hours as needed. Patient not taking: Reported on 05/30/2015 05/27/15   [name], PA-C   Physical Exam: Filed Vitals:   05/30/15 1300 05/30/15 1301 05/30/15 1330 05/30/15 1400  BP: 189/129 189/129 148/90 141/88  Pulse: 113  96 85  Temp:      TempSrc:      Resp: 26  25 24   Height:      Weight:      SpO2: 94%  98% 94%    Wt Readings from Last 3 Encounters:  05/30/15 90.719 kg (200 lb)  05/27/15 92.987 kg (205 lb)  11/22/14 94.53 kg (208 lb 6.4 oz)    General:  Appears calm and comfortable. Blood pressure  has improved since arrival. He is alert and oriented. Eyes: PERRL, normal lids, irises & conjunctiva ENT: grossly normal hearing, lips & tongue Neck: no LAD, masses or thyromegaly Cardiovascular: RRR, no m/r/g. No LE edema. There are no clinical signs of heart failure. Telemetry: SR, no arrhythmias  Respiratory: CTA bilaterally, no w/r/r. Normal respiratory effort. Abdomen: soft, ntnd Skin: no rash or induration seen on limited exam Musculoskeletal: grossly normal tone BUE/BLE Psychiatric: grossly normal mood and affect, speech fluent and appropriate Neurologic: grossly non-focal.          Labs on Admission:  Basic Metabolic Panel:  Recent Labs Lab  05/30/15 1148  NA 136  K 3.6  CL 105  CO2 20*  GLUCOSE 109*  BUN 21*  CREATININE 1.83*  CALCIUM 8.6*   Liver Function Tests:  Recent Labs Lab 05/30/15 1148  AST 26  ALT 22  ALKPHOS 50  BILITOT 1.3*  PROT 7.6  ALBUMIN 4.0    Recent Labs Lab 05/30/15 1148  LIPASE 18   No results for input(s): AMMONIA in the last 168 hours. CBC:  Recent Labs Lab 05/30/15 1148  WBC 10.8*  NEUTROABS 8.8*  HGB 12.7*  HCT 38.1*  MCV 77.4*  PLT 232   Cardiac Enzymes:  Recent Labs Lab 05/30/15 1148  TROPONINI 2.63*    BNP (last 3 results)  Recent Labs  09/26/14 0500 10/03/14 1008 05/30/15 1141  BNP 558.3* 646.1* 2192.0*    ProBNP (last 3 results) No results for input(s): PROBNP in the last 8760 hours.  CBG: No results for input(s): GLUCAP in the last 168 hours.  Radiological Exams on Admission: Dg Chest 2 View  05/30/2015  CLINICAL DATA:  Shortness of Breath EXAM: CHEST  2 VIEW COMPARISON:  09/25/2014 FINDINGS: Cardiomegaly again noted. No acute infiltrate or pleural effusion. No pulmonary edema. Central mild bronchitic changes. Bony thorax is stable. IMPRESSION: No acute infiltrate or pulmonary edema. Cardiomegaly again noted. Central mild bronchitic changes. Electronically Signed   By: Lahoma Crocker M.D.   On: 05/30/2015 11:01    EKG: Independently reviewed. Sinus rhythm without any acute ST-T wave changes. There is a suggestion of ST elevation but I'm not convinced of this.  Assessment/Plan   1. Hypertensive urgency. His blood pressure has improved with antihypertensive medications. We will continue with recommendations from cardiology in terms of medications. Cycle cardiac enzymes. Cardiology will continue to follow. 2. Nonischemic cardiomyopathy. Repeat echocardiogram. 3. Chronic kidney disease stage III. This appears to be at baseline. 4. Elevated troponin. This may be demand ischemia. Cycle cardiac enzymes.  He'll be admitted to telemetry. Further  recommendations will depend on patient's hospital progress.   Code Status: Full code  DVT Prophylaxis: Lovenox.  Family Communication: I discussed the plan with the patient at the bedside.   Disposition Plan: Home when medically stable.   Time spent: 60 minutes.  Doree Albee [facility] Pager [phone].

## 2015-05-31 ENCOUNTER — Inpatient Hospital Stay (HOSPITAL_COMMUNITY): Payer: BLUE CROSS/BLUE SHIELD

## 2015-05-31 DIAGNOSIS — I509 Heart failure, unspecified: Secondary | ICD-10-CM

## 2015-05-31 DIAGNOSIS — E876 Hypokalemia: Secondary | ICD-10-CM

## 2015-05-31 DIAGNOSIS — I5022 Chronic systolic (congestive) heart failure: Secondary | ICD-10-CM | POA: Insufficient documentation

## 2015-05-31 LAB — CBC
HCT: 36.6 % — ABNORMAL LOW (ref 39.0–52.0)
HEMOGLOBIN: 12.3 g/dL — AB (ref 13.0–17.0)
MCH: 25.8 pg — AB (ref 26.0–34.0)
MCHC: 33.6 g/dL (ref 30.0–36.0)
MCV: 76.7 fL — ABNORMAL LOW (ref 78.0–100.0)
Platelets: 203 10*3/uL (ref 150–400)
RBC: 4.77 MIL/uL (ref 4.22–5.81)
RDW: 15.8 % — ABNORMAL HIGH (ref 11.5–15.5)
WBC: 7.8 10*3/uL (ref 4.0–10.5)

## 2015-05-31 LAB — TROPONIN I: Troponin I: 1.99 ng/mL (ref <0.031)

## 2015-05-31 LAB — COMPREHENSIVE METABOLIC PANEL
ALT: 19 U/L (ref 17–63)
ANION GAP: 10 (ref 5–15)
AST: 19 U/L (ref 15–41)
Albumin: 3.6 g/dL (ref 3.5–5.0)
Alkaline Phosphatase: 45 U/L (ref 38–126)
BILIRUBIN TOTAL: 1.3 mg/dL — AB (ref 0.3–1.2)
BUN: 20 mg/dL (ref 6–20)
CALCIUM: 8.7 mg/dL — AB (ref 8.9–10.3)
CO2: 22 mmol/L (ref 22–32)
Chloride: 105 mmol/L (ref 101–111)
Creatinine, Ser: 1.88 mg/dL — ABNORMAL HIGH (ref 0.61–1.24)
GFR calc Af Amer: 52 mL/min — ABNORMAL LOW (ref >60)
GFR, EST NON AFRICAN AMERICAN: 45 mL/min — AB (ref >60)
Glucose, Bld: 107 mg/dL — ABNORMAL HIGH (ref 65–99)
POTASSIUM: 3.4 mmol/L — AB (ref 3.5–5.1)
Sodium: 137 mmol/L (ref 135–145)
Total Protein: 7.2 g/dL (ref 6.5–8.1)

## 2015-05-31 MED ORDER — POTASSIUM CHLORIDE CRYS ER 20 MEQ PO TBCR
20.0000 meq | EXTENDED_RELEASE_TABLET | Freq: Once | ORAL | Status: AC
  Administered 2015-05-31: 20 meq via ORAL
  Filled 2015-05-31: qty 1

## 2015-05-31 MED ORDER — ENOXAPARIN SODIUM 40 MG/0.4ML ~~LOC~~ SOLN
40.0000 mg | SUBCUTANEOUS | Status: DC
  Administered 2015-05-31: 40 mg via SUBCUTANEOUS
  Filled 2015-05-31: qty 0.4

## 2015-05-31 NOTE — Care Management Note (Signed)
Case Management Note  Patient Details  Name: [name] [name] MRN: [id number] Date of Birth: [birth year]  Subjective/Objective:                  Pt admitted from home with HTN urgency. Pt lives with his significant other and will return home at discharge. Pt is independent with ADL's.  Action/Plan: Financial counselor is aware of pts self pay status. Pt also given MATCH voucher for medication assistance along with [place] [facility] program information. No further CM needs noted.  Expected Discharge Date:  06/03/15               Expected Discharge Plan:  Home/Self Care  In-House Referral:  Financial Counselor  Discharge planning Services  CM Consult, [facility], In Program  Post Acute Care Choice:  NA Choice offered to:  NA  DME Arranged:    DME Agency:     HH Arranged:    HH Agency:     Status of Service:  Completed, signed off  Medicare Important Message Given:    Date Medicare IM Given:    Medicare IM give by:    Date Additional Medicare IM Given:    Additional Medicare Important Message give by:     If discussed at [place] of Stay Meetings, dates discussed:    Additional Comments:  Joylene Draft, RN 05/31/2015, 1:49 PM

## 2015-05-31 NOTE — Progress Notes (Signed)
SUBJECTIVE: Denies chest pain/shortness of breath. Feels well.     Intake/Output Summary (Last 24 hours) at 05/31/15 1049 Last data filed at 05/31/15 0500  Gross per 24 hour  Intake    240 ml  Output   1750 ml  Net  -1510 ml    Current Facility-Administered Medications  Medication Dose Route Frequency Provider Last Rate Last Dose  . acetaminophen (TYLENOL) tablet 500 mg  500 mg Oral Q6H PRN Nimish C Gosrani, MD      . carvedilol (COREG) tablet 25 mg  25 mg Oral BID WC Nimish C Gosrani, MD   25 mg at 05/31/15 1034  . enoxaparin (LOVENOX) injection 30 mg  30 mg Subcutaneous Q24H Nimish C Gosrani, MD   30 mg at 05/30/15 1811  . furosemide (LASIX) tablet 40 mg  40 mg Oral Daily Nimish C Anastasio Champion, MD   40 mg at 05/31/15 1035  . hydrALAZINE (APRESOLINE) tablet 100 mg  100 mg Oral 3 times per day Doree Albee, MD   100 mg at 05/31/15 0510  . ondansetron (ZOFRAN) tablet 4 mg  4 mg Oral Q6H PRN Nimish Luther Parody, MD       Or  . ondansetron (ZOFRAN) injection 4 mg  4 mg Intravenous Q6H PRN Nimish C Gosrani, MD      . sodium chloride 0.9 % injection 3 mL  3 mL Intravenous Q12H Nimish C Gosrani, MD   3 mL at 05/31/15 1035  . spironolactone (ALDACTONE) tablet 12.5 mg  12.5 mg Oral Daily Carmin Muskrat, MD   12.5 mg at 05/31/15 1035    Filed Vitals:   05/30/15 1638 05/30/15 1659 05/30/15 2250 05/31/15 0454  BP: 144/96  142/96 162/100  Pulse: 94  79 91  Temp: 97.8 F (36.6 C)  98.4 F (36.9 C) 98.6 F (37 C)  TempSrc: Oral  Oral Oral  Resp:   22 17  Height: 5\' 10"  (1.778 m)     Weight: 196 lb (88.905 kg) 195 lb 3.2 oz (88.542 kg)  193 lb 1.6 oz (87.59 kg)  SpO2: 99%  97% 98%    PHYSICAL EXAM General: NAD HEENT: Normal. Neck: No JVD, no thyromegaly.  Lungs: Clear to auscultation bilaterally with normal respiratory effort. CV: Regular rate and rhythm, normal S1/S2, no S3/S4, no murmur.  No pretibial edema.   Abdomen: Soft, nontender, no distention.  Neurologic: Alert  and oriented x 3.  Psych: Normal affect. Musculoskeletal: No gross deformities. Extremities: No clubbing or cyanosis.   TELEMETRY: Reviewed telemetry pt in sinus rhythm.  LABS: Basic Metabolic Panel:  Recent Labs  05/30/15 1148 05/31/15 0323  NA 136 137  K 3.6 3.4*  CL 105 105  CO2 20* 22  GLUCOSE 109* 107*  BUN 21* 20  CREATININE 1.83* 1.88*  CALCIUM 8.6* 8.7*   Liver Function Tests:  Recent Labs  05/30/15 1148 05/31/15 0323  AST 26 19  ALT 22 19  ALKPHOS 50 45  BILITOT 1.3* 1.3*  PROT 7.6 7.2  ALBUMIN 4.0 3.6    Recent Labs  05/30/15 1148  LIPASE 18   CBC:  Recent Labs  05/30/15 1148 05/31/15 0323  WBC 10.8* 7.8  NEUTROABS 8.8*  --   HGB 12.7* 12.3*  HCT 38.1* 36.6*  MCV 77.4* 76.7*  PLT 232 203   Cardiac Enzymes:  Recent Labs  05/30/15 1529 05/30/15 2128 05/31/15 0323  TROPONINI 2.38* 1.91* 1.99*   BNP: Invalid input(s): POCBNP D-Dimer: No  results for input(s): DDIMER in the last 72 hours. Hemoglobin A1C: No results for input(s): HGBA1C in the last 72 hours. Fasting Lipid Panel: No results for input(s): CHOL, HDL, LDLCALC, TRIG, CHOLHDL, LDLDIRECT in the last 72 hours. Thyroid Function Tests: No results for input(s): TSH, T4TOTAL, T3FREE, THYROIDAB in the last 72 hours.  Invalid input(s): FREET3 Anemia Panel: No results for input(s): VITAMINB12, FOLATE, FERRITIN, TIBC, IRON, RETICCTPCT in the last 72 hours.  RADIOLOGY: Dg Chest 2 View  05/30/2015  CLINICAL DATA:  Shortness of Breath EXAM: CHEST  2 VIEW COMPARISON:  09/25/2014 FINDINGS: Cardiomegaly again noted. No acute infiltrate or pleural effusion. No pulmonary edema. Central mild bronchitic changes. Bony thorax is stable. IMPRESSION: No acute infiltrate or pulmonary edema. Cardiomegaly again noted. Central mild bronchitic changes. Electronically Signed   By: Lahoma Crocker M.D.   On: 05/30/2015 11:01      ASSESSMENT AND PLAN: 1. Hypertensive urgency with troponin elevation  likely consistent with myocardial strain/demand ischemia: BP 162/100 early this morning. Now back on medical therapy with Coreg, hydralazine, and spironolactone. Symptomatically stable. Continue current therapy.  2. Nonischemic cardiomyopathy: No signs of heart failure on Lasix 40 mg. Prior documentation of normal coronaries as of May 2012. LVEF was most recently documented at 40% in March, in the past and been less than 20%. He is not on ACE inhibitor with CKD, and did not tolerate nitrates long-term previously due to headaches. Awaiting repeat echocardiogram.  3. CKD, stage 3: Cr 1.88 today.   Kate Sable, M.D., F.A.C.C.

## 2015-05-31 NOTE — Progress Notes (Signed)
PROGRESS NOTE    [name] [name] [id number] DOB: [birth year] DOA: 05/30/2015 PCP: Rocky Morel, MD  HPI/Brief narrative 34 year old male patient with history of nonischemic cardiomyopathy (last LVEF 40%), HTN, chronic systolic CHF, stage III chronic kidney disease, noncompliance to medications presented to the [facility] ED on 05/30/15 with 1 weeks history of dyspnea and precordial chest discomfort. In the ED, he was significantly hypertensive and tachycardic. Chest x-ray did not show obvious pulmonary edema. BNP was 2192 with initial troponin of 2.6. He was admitted for management of hypertensive urgency and evaluation of chest discomfort cardiology was consulted.   Assessment/Plan:  Hypertensive urgency - Secondary to medication noncompliance. States that he stopped taking his medications because he could not afford them. He was taking Coreg and Lasix which cost at him $4 each for a month's supply and hydralazine $20 for month supply, at [place]. Claims that he started working and is able to afford his medications now. - Transiently placed on IV NTG infusion in the ED and started back on carvedilol, hydralazine, Lasix and Aldactone - Much improved. Counseled extensively regarding importance of medication compliance. He verbalized understanding.  Chest discomfort and elevated troponin - Likely related to demand ischemia from admission hypertensive urgency and tachycardia. - Troponin levels trending down. - Cardiology consultation and follow-up appreciated. Awaiting 2-D echocardiogram.  Nonischemic cardiomyopathy/chronic systolic CHF - Last EF March 2016: 40%. Normal coronaries May 2012. - Compensated. - Resumed on Lasix, low-dose Aldactone and hydralazine. Not on ACEI/ARB secondary to chronic kidney disease.  Stage III chronic kidney disease - Stable creatinine at 1.8  Hypokalemia  - Replace and follow     DVT prophylaxis: Lovenox Code Status:   Full Family Communication:  none at bedside Disposition Plan:  DC home possibly 06/01/15   Consultants:   Cardiology  Procedures:   None  Antibiotics:   None   Subjective:  Denies complaints. Dyspnea and chest discomfort have resolved.  Objective: Filed Vitals:   05/30/15 1659 05/30/15 2250 05/31/15 0454 05/31/15 1332  BP:  142/96 162/100 132/89  Pulse:  79 91 77  Temp:  98.4 F (36.9 C) 98.6 F (37 C) 98 F (36.7 C)  TempSrc:  Oral Oral Oral  Resp:  22 17 18   Height:      Weight: 88.542 kg (195 lb 3.2 oz)  87.59 kg (193 lb 1.6 oz)   SpO2:  97% 98% 99%    Intake/Output Summary (Last 24 hours) at 05/31/15 1608 Last data filed at 05/31/15 1235  Gross per 24 hour  Intake    720 ml  Output   1300 ml  Net   -580 ml   Filed Weights   05/30/15 1638 05/30/15 1659 05/31/15 0454  Weight: 88.905 kg (196 lb) 88.542 kg (195 lb 3.2 oz) 87.59 kg (193 lb 1.6 oz)     Exam:  General exam: Pleasant young male sitting up comfortably in bed. Respiratory system: Clear. No increased work of breathing. Cardiovascular system: S1 & S2 heard, RRR. No JVD, murmurs, gallops, clicks or pedal edema. Telemetry: Sinus rhythm  Gastrointestinal system: Abdomen is nondistended, soft and nontender. Normal bowel sounds heard. Central nervous system: Alert and oriented. No focal neurological deficits. Extremities: Symmetric 5 x 5 power.   Data Reviewed: Basic Metabolic Panel:  Recent Labs Lab 05/30/15 1148 05/31/15 0323  NA 136 137  K 3.6 3.4*  CL 105 105  CO2 20* 22  GLUCOSE 109* 107*  BUN 21* 20  CREATININE 1.83* 1.88*  CALCIUM 8.6* 8.7*   Liver Function Tests:  Recent Labs Lab 05/30/15 1148 05/31/15 0323  AST 26 19  ALT 22 19  ALKPHOS 50 45  BILITOT 1.3* 1.3*  PROT 7.6 7.2  ALBUMIN 4.0 3.6    Recent Labs Lab 05/30/15 1148  LIPASE 18   No results for input(s): AMMONIA in the last 168 hours. CBC:  Recent Labs Lab 05/30/15 1148 05/31/15 0323  WBC 10.8*  7.8  NEUTROABS 8.8*  --   HGB 12.7* 12.3*  HCT 38.1* 36.6*  MCV 77.4* 76.7*  PLT 232 203   Cardiac Enzymes:  Recent Labs Lab 05/30/15 1148 05/30/15 1529 05/30/15 2128 05/31/15 0323  TROPONINI 2.63* 2.38* 1.91* 1.99*   BNP (last 3 results) No results for input(s): PROBNP in the last 8760 hours. CBG: No results for input(s): GLUCAP in the last 168 hours.  No results found for this or any previous visit (from the past 240 hour(s)).       Studies: Dg Chest 2 View  05/30/2015  CLINICAL DATA:  Shortness of Breath EXAM: CHEST  2 VIEW COMPARISON:  09/25/2014 FINDINGS: Cardiomegaly again noted. No acute infiltrate or pleural effusion. No pulmonary edema. Central mild bronchitic changes. Bony thorax is stable. IMPRESSION: No acute infiltrate or pulmonary edema. Cardiomegaly again noted. Central mild bronchitic changes. Electronically Signed   By: Lahoma Crocker M.D.   On: 05/30/2015 11:01        Scheduled Meds: . carvedilol  25 mg Oral BID WC  . enoxaparin (LOVENOX) injection  40 mg Subcutaneous Q24H  . furosemide  40 mg Oral Daily  . hydrALAZINE  100 mg Oral 3 times per day  . sodium chloride  3 mL Intravenous Q12H  . spironolactone  12.5 mg Oral Daily   Continuous Infusions:   Active Problems:   Elevated troponin   Chronic combined systolic and diastolic heart failure (HCC)   Hypertensive urgency   CKD (chronic kidney disease) stage 3, GFR 30-59 ml/min   Nonischemic cardiomyopathy ([place])   Hypertensive emergency    Time spent: 25 minutes.    Vernell Leep, MD, FACP, FHM. [facility] Pager [phone]  If 7PM-7AM, please contact night-coverage [email or web address] Password [email or web address] 05/31/2015, 4:08 PM    LOS: 1 day

## 2015-05-31 NOTE — Progress Notes (Signed)
*  PRELIMINARY RESULTS* Echocardiogram 2D Echocardiogram has been performed.  Leavy Cella 05/31/2015, 1:34 PM

## 2015-06-01 DIAGNOSIS — I5042 Chronic combined systolic (congestive) and diastolic (congestive) heart failure: Secondary | ICD-10-CM

## 2015-06-01 LAB — BASIC METABOLIC PANEL
Anion gap: 6 (ref 5–15)
BUN: 19 mg/dL (ref 6–20)
CALCIUM: 8.6 mg/dL — AB (ref 8.9–10.3)
CO2: 23 mmol/L (ref 22–32)
Chloride: 106 mmol/L (ref 101–111)
Creatinine, Ser: 1.73 mg/dL — ABNORMAL HIGH (ref 0.61–1.24)
GFR calc Af Amer: 58 mL/min — ABNORMAL LOW (ref >60)
GFR, EST NON AFRICAN AMERICAN: 50 mL/min — AB (ref >60)
GLUCOSE: 104 mg/dL — AB (ref 65–99)
Potassium: 3.5 mmol/L (ref 3.5–5.1)
Sodium: 135 mmol/L (ref 135–145)

## 2015-06-01 MED ORDER — SPIRONOLACTONE 25 MG PO TABS: 12.5000 mg | ORAL_TABLET | Freq: Every day | ORAL | Status: DC

## 2015-06-01 MED ORDER — ISOSORBIDE MONONITRATE ER 30 MG PO TB24: 30.0000 mg | ORAL_TABLET | Freq: Every day | ORAL | Status: DC

## 2015-06-01 MED ORDER — FUROSEMIDE 40 MG PO TABS: 40.0000 mg | ORAL_TABLET | Freq: Every day | ORAL | Status: DC

## 2015-06-01 MED ORDER — POTASSIUM CHLORIDE CRYS ER 20 MEQ PO TBCR
30.0000 meq | EXTENDED_RELEASE_TABLET | Freq: Once | ORAL | Status: AC
  Administered 2015-06-01: 30 meq via ORAL
  Filled 2015-06-01: qty 1

## 2015-06-01 MED ORDER — AMLODIPINE BESYLATE 5 MG PO TABS
2.5000 mg | ORAL_TABLET | Freq: Every day | ORAL | Status: DC
  Administered 2015-06-01: 2.5 mg via ORAL
  Filled 2015-06-01: qty 1

## 2015-06-01 MED ORDER — AMLODIPINE BESYLATE 2.5 MG PO TABS: 2.5000 mg | ORAL_TABLET | Freq: Every day | ORAL | Status: DC

## 2015-06-01 MED ORDER — HYDRALAZINE HCL 100 MG PO TABS: 100.0000 mg | ORAL_TABLET | Freq: Three times a day (TID) | ORAL | Status: DC

## 2015-06-01 MED ORDER — CARVEDILOL 25 MG PO TABS: 25.0000 mg | ORAL_TABLET | Freq: Two times a day (BID) | ORAL | Status: DC

## 2015-06-01 MED ORDER — ISOSORBIDE MONONITRATE ER 60 MG PO TB24
30.0000 mg | ORAL_TABLET | Freq: Every day | ORAL | Status: DC
  Administered 2015-06-01: 30 mg via ORAL
  Filled 2015-06-01: qty 1

## 2015-06-01 NOTE — Discharge Summary (Signed)
Physician Discharge Summary  [name] [name] [id number] DOB: [birth year] DOA: 05/30/2015  PCP: Rocky Morel, MD  Admit date: 05/30/2015 Discharge date: 06/01/2015  Time spent: Greater than 30 minutes  Recommendations for Outpatient Follow-up:  1. Dr. Glori Bickers, Cardiology, in 1 week with repeat labs (BMP). 2. Dr. Micheline Rough, PCP in 1 week.  Discharge Diagnoses:  Active Problems:   Elevated troponin   Chronic combined systolic and diastolic heart failure (HCC)   Hypertensive urgency   CKD (chronic kidney disease) stage 3, GFR 30-59 ml/min   Nonischemic cardiomyopathy (HCC)   Hypertensive emergency   Chronic systolic CHF (congestive heart failure) ([place])   Discharge Condition: Improved & Stable  Diet recommendation: Heart healthy diet.  Filed Weights   05/30/15 1659 05/31/15 0454 06/01/15 0628  Weight: 88.542 kg (195 lb 3.2 oz) 87.59 kg (193 lb 1.6 oz) 87.907 kg (193 lb 12.8 oz)    History of present illness:  34 year old male patient with history of nonischemic cardiomyopathy (last LVEF 40%), HTN, chronic systolic CHF, stage III chronic kidney disease, noncompliance to medications presented to the [facility] ED on 05/30/15 with 1 weeks history of dyspnea and precordial chest discomfort. In the ED, he was significantly hypertensive and tachycardic. Chest x-ray did not show obvious pulmonary edema. BNP was 2192 with initial troponin of 2.6. He was admitted for management of hypertensive urgency and evaluation of chest discomfort cardiology was consulted.  Hospital Course:   Hypertensive urgency - Secondary to medication noncompliance. States that he stopped taking his medications because he could not afford them. He was taking Coreg and Lasix which cost at him $4 each for a month's supply and hydralazine $20 for month supply, at [place]. Claims that he started working and is able to afford his medications now. - Transiently placed on IV NTG  infusion in the ED and started back on carvedilol, hydralazine, Lasix and Aldactone - Much improved. Counseled extensively regarding importance of medication compliance. He verbalized understanding. - Blood pressures still elevated as below. - Discussed with Cardiologist on call, Dr. Aundra Dubin who advised adding Imdur 30 mg daily which would also help with his cardiomyopathy and adding amlodipine 2.5 MG daily. He was okay with patient being discharged home and would assist with outpatient follow-up with advanced CHF clinic.  Chest discomfort and elevated troponin - Likely related to demand ischemia from admission hypertensive urgency and tachycardia. - Troponin levels trending down. - Cardiology consultation and follow-up appreciated. 2-D echo results as below. EF is again decreased-most likely secondary to noncompliance with medications.  Nonischemic cardiomyopathy/chronic systolic versus combined systolic and diastolic CHF - Last EF March 2016: 40%. Normal coronaries May 2012. - Compensated. - Resumed on Lasix, low-dose Aldactone and hydralazine. Not on ACEI/ARB secondary to chronic kidney disease. - As discussed with cardiology, will add Imdur at discharge. 2-D echo results as below-EF is again decreased secondary to noncompliance.  Stage III chronic kidney disease - Stable creatinine at 1.8 area and follow BMP closely as outpatient.  Hypokalemia  - Replace prior to discharge and follow as outpatient. Patient on Aldactone.     Consultants:  Cardiology  Procedures:  2-D echo 05/31/15: Study Conclusions  - Left ventricle: The cavity size was normal. Wall thickness was increased in a pattern of severe LVH. Systolic function was severely reduced. The estimated ejection fraction was in the range of 20% to 25%. Global hypokinesis. Features are consistent with a pseudonormal left ventricular filling pattern, with concomitant abnormal relaxation and increased filling  pressure (grade 2 diastolic dysfunction). Doppler parameters are consistent with high ventricular filling pressure. - Aortic valve: There was mild regurgitation. - Mitral valve: There was mild to moderate regurgitation. - Left atrium: The atrium was severely dilated. - Right ventricle: Systolic function was moderately reduced.  Impressions:  - LVEF now severely reduced.  Antibiotics:  None  Discharge Exam:  Complaints: Denies complaints. Denies dyspnea or chest pain.  Filed Vitals:   05/31/15 2129 06/01/15 0628 06/01/15 1300 06/01/15 1430  BP: 141/97 160/109 158/104 142/97  Pulse: 85 85 83 80  Temp: 98.4 F (36.9 C) 97.9 F (36.6 C)    TempSrc: Oral Oral    Resp: 16 16 16    Height:      Weight:  87.907 kg (193 lb 12.8 oz)    SpO2: 98% 97% 99%     General exam: Pleasant young male lying comfortably supine in bed. Respiratory system: Clear. No increased work of breathing. Cardiovascular system: S1 & S2 heard, RRR. No JVD, murmurs, gallops, clicks or pedal edema.  Gastrointestinal system: Abdomen is nondistended, soft and nontender. Normal bowel sounds heard. Central nervous system: Alert and oriented. No focal neurological deficits. Extremities: Symmetric 5 x 5 power.  Discharge Instructions      Discharge Instructions    (HEART FAILURE PATIENTS) Call MD:  Anytime you have any of the following symptoms: 1) 3 pound weight gain in 24 hours or 5 pounds in 1 week 2) shortness of breath, with or without a dry hacking cough 3) swelling in the hands, feet or stomach 4) if you have to sleep on extra pillows at night in order to breathe.    Complete by:  As directed      Call MD for:  difficulty breathing, headache or visual disturbances    Complete by:  As directed      Call MD for:  extreme fatigue    Complete by:  As directed      Call MD for:  persistant dizziness or light-headedness    Complete by:  As directed      Call MD for:  severe uncontrolled pain     Complete by:  As directed      Diet - low sodium heart healthy    Complete by:  As directed      Increase activity slowly    Complete by:  As directed             Medication List    STOP taking these medications        clindamycin 300 MG capsule  Commonly known as:  CLEOCIN     traMADol 50 MG tablet  Commonly known as:  ULTRAM      TAKE these medications        acetaminophen 500 MG tablet  Commonly known as:  TYLENOL  Take 500 mg by mouth every 6 (six) hours as needed.     amLODipine 2.5 MG tablet  Commonly known as:  NORVASC  Take 1 tablet (2.5 mg total) by mouth daily.     carvedilol 25 MG tablet  Commonly known as:  COREG  Take 1 tablet (25 mg total) by mouth 2 (two) times daily with a meal.     furosemide 40 MG tablet  Commonly known as:  LASIX  Take 1 tablet (40 mg total) by mouth daily.     hydrALAZINE 100 MG tablet  Commonly known as:  APRESOLINE  Take 1 tablet (100 mg total) by mouth 3 (  three) times daily.     isosorbide mononitrate 30 MG 24 hr tablet  Commonly known as:  IMDUR  Take 1 tablet (30 mg total) by mouth daily.     spironolactone 25 MG tablet  Commonly known as:  ALDACTONE  Take 0.5 tablets (12.5 mg total) by mouth daily.       Follow-up Information    Follow up with Rocky Morel, MD. Schedule an appointment as soon as possible for a visit in 1 week.   Specialty:  Family Medicine   Contact information:   [address] [place] [id number] [phone]       Follow up with Glori Bickers, MD. Schedule an appointment as soon as possible for a visit in 1 week.   Specialty:  Cardiology   Why:  To be seen with repeat labs (BMP).   Contact information:   [address] [place] [address] [phone]        The results of significant diagnostics from this hospitalization (including imaging, microbiology, ancillary and laboratory) are listed below for reference.    Significant Diagnostic  Studies: Dg Chest 2 View  05/30/2015  CLINICAL DATA:  Shortness of Breath EXAM: CHEST  2 VIEW COMPARISON:  09/25/2014 FINDINGS: Cardiomegaly again noted. No acute infiltrate or pleural effusion. No pulmonary edema. Central mild bronchitic changes. Bony thorax is stable. IMPRESSION: No acute infiltrate or pulmonary edema. Cardiomegaly again noted. Central mild bronchitic changes. Electronically Signed   By: Lahoma Crocker M.D.   On: 05/30/2015 11:01    Microbiology: No results found for this or any previous visit (from the past 240 hour(s)).   Labs: Basic Metabolic Panel:  Recent Labs Lab 05/30/15 1148 05/31/15 0323 06/01/15 0610  NA 136 137 135  K 3.6 3.4* 3.5  CL 105 105 106  CO2 20* 22 23  GLUCOSE 109* 107* 104*  BUN 21* 20 19  CREATININE 1.83* 1.88* 1.73*  CALCIUM 8.6* 8.7* 8.6*   Liver Function Tests:  Recent Labs Lab 05/30/15 1148 05/31/15 0323  AST 26 19  ALT 22 19  ALKPHOS 50 45  BILITOT 1.3* 1.3*  PROT 7.6 7.2  ALBUMIN 4.0 3.6    Recent Labs Lab 05/30/15 1148  LIPASE 18   No results for input(s): AMMONIA in the last 168 hours. CBC:  Recent Labs Lab 05/30/15 1148 05/31/15 0323  WBC 10.8* 7.8  NEUTROABS 8.8*  --   HGB 12.7* 12.3*  HCT 38.1* 36.6*  MCV 77.4* 76.7*  PLT 232 203   Cardiac Enzymes:  Recent Labs Lab 05/30/15 1148 05/30/15 1529 05/30/15 2128 05/31/15 0323  TROPONINI 2.63* 2.38* 1.91* 1.99*   BNP: BNP (last 3 results)  Recent Labs  09/26/14 0500 10/03/14 1008 05/30/15 1141  BNP 558.3* 646.1* 2192.0*    ProBNP (last 3 results) No results for input(s): PROBNP in the last 8760 hours.  CBG: No results for input(s): GLUCAP in the last 168 hours.      Signed:  Vernell Leep, MD, FACP, FHM. [facility] Pager [phone]  If 7PM-7AM, please contact night-coverage [email or web address] Password [email or web address] 06/01/2015, 3:21 PM

## 2015-06-01 NOTE — Discharge Instructions (Signed)

## 2015-06-01 NOTE — Progress Notes (Signed)
Patient discharged home.  IV removed - WNL.  Reviewed meds and DC instructions.  Reinforced education of HF management at home.  Verbalizes understanding.  Instructed to make follow up appointments with PCP and cardio.  No questions at this time.  Stable to DC home, assisted off unit by RN.

## 2015-06-04 ENCOUNTER — Encounter (HOSPITAL_COMMUNITY): Payer: Self-pay | Admitting: Internal Medicine

## 2015-06-04 ENCOUNTER — Ambulatory Visit (HOSPITAL_COMMUNITY)
Admission: RE | Admit: 2015-06-04 | Discharge: 2015-06-04 | Disposition: A | Discharge status: Home / Self Care | Payer: BLUE CROSS/BLUE SHIELD | Source: Ambulatory Visit | Attending: Internal Medicine | Admitting: Internal Medicine

## 2015-06-04 VITALS — BP 152/98 | HR 84 | Wt 199.2 lb

## 2015-06-04 DIAGNOSIS — I5022 Chronic systolic (congestive) heart failure: Secondary | ICD-10-CM

## 2015-06-04 DIAGNOSIS — I130 Hypertensive heart and chronic kidney disease with heart failure and stage 1 through stage 4 chronic kidney disease, or unspecified chronic kidney disease: Secondary | ICD-10-CM | POA: Insufficient documentation

## 2015-06-04 DIAGNOSIS — N182 Chronic kidney disease, stage 2 (mild): Secondary | ICD-10-CM | POA: Insufficient documentation

## 2015-06-04 DIAGNOSIS — R0683 Snoring: Secondary | ICD-10-CM | POA: Insufficient documentation

## 2015-06-04 LAB — BASIC METABOLIC PANEL
Anion gap: 8 (ref 5–15)
BUN: 22 mg/dL — ABNORMAL HIGH (ref 6–20)
CALCIUM: 8.9 mg/dL (ref 8.9–10.3)
CO2: 22 mmol/L (ref 22–32)
CREATININE: 1.9 mg/dL — AB (ref 0.61–1.24)
Chloride: 106 mmol/L (ref 101–111)
GFR calc Af Amer: 52 mL/min — ABNORMAL LOW (ref >60)
GFR calc non Af Amer: 44 mL/min — ABNORMAL LOW (ref >60)
GLUCOSE: 139 mg/dL — AB (ref 65–99)
Potassium: 3.7 mmol/L (ref 3.5–5.1)
Sodium: 136 mmol/L (ref 135–145)

## 2015-06-04 MED ORDER — LOSARTAN POTASSIUM 25 MG PO TABS: 25.0000 mg | ORAL_TABLET | Freq: Every day | ORAL | Status: DC

## 2015-06-04 NOTE — Patient Instructions (Signed)
Stop Imdur (Isosorbide)  Start Losartan 25 mg daily  Labs today  Labs in 1 week  Your physician recommends that you schedule a follow-up appointment with our pharmacist in 2 weeks  Your physician recommends that you schedule a follow-up appointment in: 4 weeks

## 2015-06-04 NOTE — Progress Notes (Signed)
Advanced Heart Failure Clinic Note   Patient ID: MYRON HINNENKAMP, male   DOB: 1981/01/30, 34 y.o.   MRN: [id number]  Primary PCP: [place]  Primary Cardiologist: Dr. Haroldine Laws  HPI: Mr. Santo is a 34 y.o.male with known history of NICM, hypertension and CKD stage II (baseline Cr 1.5-1.7).   He was first diagnosed with HF in 2012 and his EF recovered and was instructed to stop taking carvedilol, spironolactone, and lasix by previous cardiologist. Admitted to [facility] [id number] for acute systolic heart failure. 12/31/11 ProBNP 3777. HIV nonreactive. Thyroid panel normal. Renal ultrasound was negative for obstruction. EF 20%. Discharge weight 193 lbs.   Admitted 05/30/2015 with HTN urgency with troponin elevation likely consistent with myocardial strain/demand ischemia in the setting of medication non compliance, having run out 2-3 weeks ago and not refilling them. Placed back on his coreg, hydralazine, and spiro and was symptomatically stable on discharge. No ACE/ARB with CKD and does not tolerate nitrates with headaches. Discharge weight 193 lbs.  He returns today for post hospital follow up. Has been feeling well at home.  Is having headaches again on imdur, which was resumed this past admission.  Fluid status stable. Weight around 191 at home (dressed for cold at clinic).  Blood pressure slightly elevated this am, but only took medicines at 1000. Not currently taking at home. Watching salt and fluid intake.  Does still snore, has not gone to sleep study. Scheduling conflicts and money problems losing his job. Is now working again. Now works [employer] in [place]. Health benefits not active until 07/14/15.   ECHO 06/13/13: EF 20-25% ECHO 09/26/2014: EF 40%  ECHO 05/31/15 EF 20-25%  06/14/13: K 3.6 Cr 2.00 06/27/14 K 3.7 Creatinine 1.42  09/27/2014: K 3.6 Creatinine 1.76  10/03/14: K 3.9 Creatinine 1.99   ROS: All systems negative except as listed in HPI, PMH and Problem List.  Past Medical  History  Diagnosis Date  . History of pneumonia   . Nonischemic cardiomyopathy ([place])     Normal coronaries May 2012, LVEF < 20%  . Chronic systolic heart failure ([place])   . CKD (chronic kidney disease) stage 2, GFR 60-89 ml/min   . Essential hypertension, benign   . Noncompliance   . Mitral regurgitation     Moderate    Current Outpatient Prescriptions  Medication Sig Dispense Refill  . acetaminophen (TYLENOL) 500 MG tablet Take 500 mg by mouth every 6 (six) hours as needed.    Marland Kitchen amLODipine (NORVASC) 2.5 MG tablet Take 1 tablet (2.5 mg total) by mouth daily. 30 tablet 0  . carvedilol (COREG) 25 MG tablet Take 1 tablet (25 mg total) by mouth 2 (two) times daily with a meal. 60 tablet 0  . furosemide (LASIX) 40 MG tablet Take 1 tablet (40 mg total) by mouth daily. 30 tablet 0  . hydrALAZINE (APRESOLINE) 100 MG tablet Take 1 tablet (100 mg total) by mouth 3 (three) times daily. 90 tablet 0  . isosorbide mononitrate (IMDUR) 30 MG 24 hr tablet Take 1 tablet (30 mg total) by mouth daily. 30 tablet 0  . spironolactone (ALDACTONE) 25 MG tablet Take 0.5 tablets (12.5 mg total) by mouth daily. 15 tablet 0   No current facility-administered medications for this encounter.    Filed Vitals:   06/04/15 1050  BP: 152/98  Pulse: 84  Weight: 199 lb 4 oz (90.379 kg)  SpO2: 98%    PHYSICAL EXAM: General:  Well appearing. No resp difficulty  HEENT: normal Neck:  supple. JVP 5-6 with mild HJR . Carotids 2+ bilaterally; no bruits. No lymphadenopathy or thryomegaly appreciated. Cor: PMI normal. RRR. No M/G/R. Lungs: CTA, normal effort Abdomen: soft, NT, ND, no HSM. No bruits or masses. +BS Extremities: no cyanosis, clubbing, rash, edema Neuro: alert & orientedx3, cranial nerves grossly intact. Moves all 4 extremities w/o difficulty. Affect pleasant.   ASSESSMENT & PLAN:  1) Chronic systolic HF: NICM, EF 0000000 (06/2013)--> ECHO EF 40% --> ECHO 05/31/15 EF 20-25% - Currently NYHA I - early II  despite recurrent severe LV dysfunction - Volume status stable. Continue lasix 40 mg daily. Can take an extra 40 if weight > 195.  - Continue hydralazine 100 mg TID. Stop Imdur 2/2 HA.  - Continue norvasc 2.5 mg daily. - Start losartan 25 mg daily.  - Reinforced medication compliance, limiting fluid intake to < 2 liters per day, and daily weights.  2) HTN : Recent admit with HTN urgency. Med changes as above. - Reinforced need for daily monitoring. 3) CKD stage II:  (baseline Cr 1.5-1.7) Stable on discharge. - Stressed need for BP control via medical compliance to reduce risk of progression of CKD.  - Needs to be set up with [facility]. 4) Snoring: Has mild polycythemia. Likely has OSA.  - Saw Dr. Gwenette Greet. Still needs Sleep study. Rescheduled x 2 and then N/S.  Multiple ongoing needs with sleep study and Kidney MD.  Will need to see these in January after benefits take effect.   Shirley Friar, PA-C 10:54 AM  Patient seen and examined with Oda Kilts, PA-C. We discussed all aspects of the encounter. I agree with the assessment and plan as stated above.   His EF is back down after stopping meds. Fortunately volume status looks good and functional status relatively well preserved. Stressed need for med compliance. WIll follow LV function. If EF remains <= 35% consider ICD.   Shelanda Duvall,MD 7:23 PM

## 2015-06-12 ENCOUNTER — Other Ambulatory Visit (HOSPITAL_COMMUNITY): Payer: Self-pay

## 2015-06-19 ENCOUNTER — Ambulatory Visit (HOSPITAL_COMMUNITY): Payer: Self-pay

## 2015-07-05 ENCOUNTER — Encounter (HOSPITAL_COMMUNITY): Payer: Self-pay | Admitting: Internal Medicine

## 2015-11-05 ENCOUNTER — Other Ambulatory Visit: Payer: Self-pay | Admitting: Physician Assistant

## 2016-03-23 DIAGNOSIS — Z6831 Body mass index (BMI) 31.0-31.9, adult: Secondary | ICD-10-CM | POA: Diagnosis not present

## 2016-03-23 DIAGNOSIS — Z1389 Encounter for screening for other disorder: Secondary | ICD-10-CM | POA: Diagnosis not present

## 2016-03-23 DIAGNOSIS — I10 Essential (primary) hypertension: Secondary | ICD-10-CM | POA: Diagnosis not present

## 2016-03-23 DIAGNOSIS — N189 Chronic kidney disease, unspecified: Secondary | ICD-10-CM | POA: Diagnosis not present

## 2016-03-23 DIAGNOSIS — I420 Dilated cardiomyopathy: Secondary | ICD-10-CM | POA: Diagnosis not present

## 2016-04-02 ENCOUNTER — Encounter (HOSPITAL_COMMUNITY): Payer: Self-pay | Admitting: Cardiology

## 2016-04-02 ENCOUNTER — Emergency Department (HOSPITAL_COMMUNITY)
Admission: EM | Admit: 2016-04-02 | Discharge: 2016-04-02 | Disposition: A | Discharge status: Home / Self Care | Payer: BLUE CROSS/BLUE SHIELD | Attending: Emergency Medicine | Admitting: Emergency Medicine

## 2016-04-02 ENCOUNTER — Emergency Department (HOSPITAL_COMMUNITY): Payer: BLUE CROSS/BLUE SHIELD

## 2016-04-02 DIAGNOSIS — M7989 Other specified soft tissue disorders: Secondary | ICD-10-CM | POA: Diagnosis not present

## 2016-04-02 DIAGNOSIS — Z79899 Other long term (current) drug therapy: Secondary | ICD-10-CM | POA: Insufficient documentation

## 2016-04-02 DIAGNOSIS — S9032XA Contusion of left foot, initial encounter: Secondary | ICD-10-CM | POA: Insufficient documentation

## 2016-04-02 DIAGNOSIS — Y939 Activity, unspecified: Secondary | ICD-10-CM | POA: Diagnosis not present

## 2016-04-02 DIAGNOSIS — S99922A Unspecified injury of left foot, initial encounter: Secondary | ICD-10-CM | POA: Diagnosis present

## 2016-04-02 DIAGNOSIS — Y929 Unspecified place or not applicable: Secondary | ICD-10-CM | POA: Diagnosis not present

## 2016-04-02 DIAGNOSIS — I130 Hypertensive heart and chronic kidney disease with heart failure and stage 1 through stage 4 chronic kidney disease, or unspecified chronic kidney disease: Secondary | ICD-10-CM | POA: Insufficient documentation

## 2016-04-02 DIAGNOSIS — N183 Chronic kidney disease, stage 3 (moderate): Secondary | ICD-10-CM | POA: Insufficient documentation

## 2016-04-02 DIAGNOSIS — W228XXA Striking against or struck by other objects, initial encounter: Secondary | ICD-10-CM | POA: Insufficient documentation

## 2016-04-02 DIAGNOSIS — I5043 Acute on chronic combined systolic (congestive) and diastolic (congestive) heart failure: Secondary | ICD-10-CM | POA: Insufficient documentation

## 2016-04-02 DIAGNOSIS — Y990 Civilian activity done for income or pay: Secondary | ICD-10-CM | POA: Insufficient documentation

## 2016-04-02 MED ORDER — IBUPROFEN 400 MG PO TABS: 400.0000 mg | ORAL_TABLET | Freq: Four times a day (QID) | ORAL | 0 refills | Status: DC

## 2016-04-02 MED ORDER — IBUPROFEN 400 MG PO TABS
400.0000 mg | ORAL_TABLET | Freq: Once | ORAL | Status: AC
  Administered 2016-04-02: 400 mg via ORAL
  Filled 2016-04-02: qty 1

## 2016-04-02 NOTE — ED Provider Notes (Addendum)
[facility] DEPT Provider Note   CSN: [id number] Arrival date & time: 04/02/16  1532     History   Chief Complaint Chief Complaint  Patient presents with  . Foot Injury    HPI [name] [name] is a 35 y.o. male.  Left foot injury after kicking some type of metal shelf at work earlier today. Significant pain with walking. Has iced it with some help but not significant improvement. No other associated symptoms. No other modifying factors. No history of the same.      Past Medical History:  Diagnosis Date  . Chronic systolic heart failure ([place])   . CKD (chronic kidney disease) stage 2, GFR 60-89 ml/min   . Essential hypertension, benign   . History of pneumonia   . Mitral regurgitation    Moderate  . Noncompliance   . Nonischemic cardiomyopathy ([place])    Normal coronaries May 2012, LVEF < 20%    Patient Active Problem List   Diagnosis Date Noted  . Chronic systolic CHF (congestive heart failure) ([place])   . Hypertensive emergency 05/30/2015  . Essential hypertension 11/22/2014  . Shift work sleep disorder 10/30/2014  . Obstructive sleep apnea 10/30/2014  . Hypertensive crisis 09/26/2014  . Hypertensive urgency 06/13/2013  . CHF (congestive heart failure) ([place]) 06/13/2013  . CKD (chronic kidney disease) stage 3, GFR 30-59 ml/min 06/13/2013  . Acute on chronic combined systolic and diastolic heart failure ([place]) 06/13/2013  . Nonischemic cardiomyopathy ([place]) 06/13/2013  . HTN (hypertension) 02/25/2012  . Chronic combined systolic and diastolic heart failure ([place]) 01/08/2012  . Elevated troponin 01/01/2012  . Acute on chronic combined systolic and diastolic CHF (congestive heart failure) ([place]) 12/31/2011  . Hypokalemia 12/31/2011  . Chest pain 12/31/2011  . CKD (chronic kidney disease) 12/31/2011  . Renal failure (ARF), acute on chronic ([place]) 12/31/2011    Past Surgical History:  Procedure Laterality Date  . SURGERY SCROTAL / TESTICULAR     Testicular torsion         Home Medications    Prior to Admission medications   Medication Sig Start Date End Date Taking? Authorizing Provider  acetaminophen (TYLENOL) 500 MG tablet Take 500 mg by mouth every 6 (six) hours as needed.    Historical Provider, MD  amLODipine (NORVASC) 2.5 MG tablet Take 1 tablet (2.5 mg total) by mouth daily. 06/01/15   Modena Jansky, MD  carvedilol (COREG) 25 MG tablet Take 1 tablet (25 mg total) by mouth 2 (two) times daily with a meal. 06/01/15   Modena Jansky, MD  carvedilol (COREG) 25 MG tablet TAKE ONE TABLET BY MOUTH TWICE DAILY WITH A MEAL 11/05/15   Jolaine Artist, MD  furosemide (LASIX) 40 MG tablet Take 1 tablet (40 mg total) by mouth daily. 06/01/15   Modena Jansky, MD  hydrALAZINE (APRESOLINE) 100 MG tablet Take 1 tablet (100 mg total) by mouth 3 (three) times daily. 06/01/15   Modena Jansky, MD  hydrALAZINE (APRESOLINE) 100 MG tablet TAKE ONE TABLET BY MOUTH THREE TIMES DAILY 11/05/15   Jolaine Artist, MD  ibuprofen (ADVIL,MOTRIN) 400 MG tablet Take 1 tablet (400 mg total) by mouth 4 (four) times daily. 04/02/16   Merrily Pew, MD  losartan (COZAAR) 25 MG tablet Take 1 tablet (25 mg total) by mouth daily. 06/04/15   Jolaine Artist, MD  spironolactone (ALDACTONE) 25 MG tablet Take 0.5 tablets (12.5 mg total) by mouth daily. 06/01/15   Modena Jansky, MD    Family  History Family History  Problem Relation Age of Onset  . Breast cancer Mother   . Stroke Father     Social History Social History  Substance Use Topics  . Smoking status: Never Smoker  . Smokeless tobacco: Never Used  . Alcohol use 0.0 oz/week     Comment: Occasionally     Allergies   Review of patient's allergies indicates no known allergies.   Review of Systems Review of Systems  All other systems reviewed and are negative.    Physical Exam Updated Vital Signs BP (!) 183/120 (BP Location: Left Arm)   Pulse 80   Temp 98.1 F (36.7 C) (Oral)   Resp 16   Ht  5\' 10"  (1.778 m)   Wt 206 lb (93.4 kg)   SpO2 100%   BMI 29.56 kg/m   Physical Exam  Constitutional: He is oriented to person, place, and time. He appears well-developed and well-nourished.  HENT:  Head: Normocephalic and atraumatic.  Eyes: Conjunctivae and EOM are normal.  Neck: Normal range of motion.  Cardiovascular: Normal rate.  Exam reveals no gallop.   Pulmonary/Chest: Effort normal. No respiratory distress. He has no rales.  Abdominal: Soft. He exhibits no distension.  Musculoskeletal: Normal range of motion. He exhibits tenderness (dorsal right foot over metatarsals). He exhibits no deformity.  Neurological: He is alert and oriented to person, place, and time.  Skin: Skin is warm and dry.  Nursing note and vitals reviewed.    ED Treatments / Results  Labs (all labs ordered are listed, but only abnormal results are displayed) Labs Reviewed - No data to display  EKG  EKG Interpretation None       Radiology Dg Foot Complete Left  Result Date: 04/02/2016 CLINICAL DATA:  Right dorsal foot pain after trauma today. The patient hit the top of the left foot on a metal rack. EXAM: LEFT FOOT - COMPLETE 3+ VIEW COMPARISON:  None. FINDINGS: There is no evidence of fracture or dislocation. Plantar and dorsal calcaneal enthesophytes are seen as well as an accessory ossicle adjacent to the cuboid and tip of medial malleolus. There is soft tissue swelling over the dorsum of the mid and forefoot. Joint spaces are intact. IMPRESSION: No acute osseous abnormality. Mild soft tissue swelling of the mid and forefoot dorsally. Electronically Signed   By: Ashley Royalty M.D.   On: 04/02/2016 16:15    Procedures Procedures (including critical care time)  Medications Ordered in ED Medications  ibuprofen (ADVIL,MOTRIN) tablet 400 mg (400 mg Oral Given 04/02/16 1726)     Initial Impression / Assessment and Plan / ED Course  I have reviewed the triage vital signs and the nursing  notes.  Pertinent labs & imaging results that were available during my care of the patient were reviewed by me and considered in my medical decision making (see chart for details).  Clinical Course    Foot contusion, no fracture. Conservative therapy at home is appropriate.   Final Clinical Impressions(s) / ED Diagnoses   Final diagnoses:  Foot contusion, left, initial encounter    New Prescriptions Discharge Medication List as of 04/02/2016  5:17 PM    START taking these medications   Details  ibuprofen (ADVIL,MOTRIN) 400 MG tablet Take 1 tablet (400 mg total) by mouth 4 (four) times daily., Starting Thu 04/02/2016, Print         Merrily Pew, MD 04/02/16 2118    Merrily Pew, MD 05/14/16 0001

## 2016-04-02 NOTE — ED Notes (Signed)
Dr. Dayna Barker okay with sending home pt with bp 183/120, pt states he is about to take his bp at home.

## 2016-04-02 NOTE — ED Triage Notes (Addendum)
Hit left foot on metal stand yesterday at work.  Swelling to top of left foot

## 2016-04-07 DIAGNOSIS — Z6832 Body mass index (BMI) 32.0-32.9, adult: Secondary | ICD-10-CM | POA: Diagnosis not present

## 2016-04-07 DIAGNOSIS — N183 Chronic kidney disease, stage 3 (moderate): Secondary | ICD-10-CM | POA: Diagnosis not present

## 2016-04-07 DIAGNOSIS — I420 Dilated cardiomyopathy: Secondary | ICD-10-CM | POA: Diagnosis not present

## 2016-04-07 DIAGNOSIS — I10 Essential (primary) hypertension: Secondary | ICD-10-CM | POA: Diagnosis not present

## 2016-04-07 DIAGNOSIS — Z1389 Encounter for screening for other disorder: Secondary | ICD-10-CM | POA: Diagnosis not present

## 2016-04-29 DIAGNOSIS — Z6832 Body mass index (BMI) 32.0-32.9, adult: Secondary | ICD-10-CM | POA: Diagnosis not present

## 2016-04-29 DIAGNOSIS — Z1389 Encounter for screening for other disorder: Secondary | ICD-10-CM | POA: Diagnosis not present

## 2016-04-29 DIAGNOSIS — N183 Chronic kidney disease, stage 3 (moderate): Secondary | ICD-10-CM | POA: Diagnosis not present

## 2016-04-29 DIAGNOSIS — I10 Essential (primary) hypertension: Secondary | ICD-10-CM | POA: Diagnosis not present

## 2016-05-01 ENCOUNTER — Ambulatory Visit (HOSPITAL_COMMUNITY)
Admission: RE | Admit: 2016-05-01 | Discharge: 2016-05-01 | Disposition: A | Discharge status: Home / Self Care | Payer: BLUE CROSS/BLUE SHIELD | Source: Ambulatory Visit | Attending: Internal Medicine | Admitting: Internal Medicine

## 2016-05-01 VITALS — BP 150/100 | HR 80 | Wt 210.0 lb

## 2016-05-01 DIAGNOSIS — I5022 Chronic systolic (congestive) heart failure: Secondary | ICD-10-CM | POA: Diagnosis not present

## 2016-05-01 DIAGNOSIS — I429 Cardiomyopathy, unspecified: Secondary | ICD-10-CM | POA: Diagnosis not present

## 2016-05-01 DIAGNOSIS — I10 Essential (primary) hypertension: Secondary | ICD-10-CM

## 2016-05-01 DIAGNOSIS — R0683 Snoring: Secondary | ICD-10-CM | POA: Diagnosis not present

## 2016-05-01 DIAGNOSIS — Z9114 Patient's other noncompliance with medication regimen: Secondary | ICD-10-CM | POA: Insufficient documentation

## 2016-05-01 DIAGNOSIS — I340 Nonrheumatic mitral (valve) insufficiency: Secondary | ICD-10-CM | POA: Diagnosis not present

## 2016-05-01 DIAGNOSIS — I130 Hypertensive heart and chronic kidney disease with heart failure and stage 1 through stage 4 chronic kidney disease, or unspecified chronic kidney disease: Secondary | ICD-10-CM | POA: Diagnosis not present

## 2016-05-01 DIAGNOSIS — N182 Chronic kidney disease, stage 2 (mild): Secondary | ICD-10-CM | POA: Insufficient documentation

## 2016-05-01 DIAGNOSIS — D751 Secondary polycythemia: Secondary | ICD-10-CM | POA: Diagnosis not present

## 2016-05-01 LAB — COMPREHENSIVE METABOLIC PANEL
ALT: 19 U/L (ref 17–63)
AST: 20 U/L (ref 15–41)
Albumin: 4.1 g/dL (ref 3.5–5.0)
Alkaline Phosphatase: 52 U/L (ref 38–126)
Anion gap: 9 (ref 5–15)
BUN: 16 mg/dL (ref 6–20)
CHLORIDE: 105 mmol/L (ref 101–111)
CO2: 21 mmol/L — AB (ref 22–32)
CREATININE: 2.01 mg/dL — AB (ref 0.61–1.24)
Calcium: 9.1 mg/dL (ref 8.9–10.3)
GFR calc Af Amer: 48 mL/min — ABNORMAL LOW (ref >60)
GFR calc non Af Amer: 41 mL/min — ABNORMAL LOW (ref >60)
Glucose, Bld: 97 mg/dL (ref 65–99)
Potassium: 3.6 mmol/L (ref 3.5–5.1)
SODIUM: 135 mmol/L (ref 135–145)
Total Bilirubin: 0.7 mg/dL (ref 0.3–1.2)
Total Protein: 7.5 g/dL (ref 6.5–8.1)

## 2016-05-01 LAB — CBC
HCT: 47 % (ref 39.0–52.0)
Hemoglobin: 16.2 g/dL (ref 13.0–17.0)
MCH: 26.5 pg (ref 26.0–34.0)
MCHC: 34.5 g/dL (ref 30.0–36.0)
MCV: 76.9 fL — AB (ref 78.0–100.0)
PLATELETS: 196 10*3/uL (ref 150–400)
RBC: 6.11 MIL/uL — ABNORMAL HIGH (ref 4.22–5.81)
RDW: 15.7 % — AB (ref 11.5–15.5)
WBC: 4.4 10*3/uL (ref 4.0–10.5)

## 2016-05-01 LAB — BRAIN NATRIURETIC PEPTIDE: B Natriuretic Peptide: 298.1 pg/mL — ABNORMAL HIGH (ref 0.0–100.0)

## 2016-05-01 MED ORDER — SACUBITRIL-VALSARTAN 49-51 MG PO TABS: 1.0000 | ORAL_TABLET | Freq: Two times a day (BID) | ORAL | 3 refills | Status: DC

## 2016-05-01 NOTE — Progress Notes (Signed)
Advanced Heart Failure Medication Review by a Pharmacist  Does the patient  feel that his/her medications are working for him/her?  yes  Has the patient been experiencing any side effects to the medications prescribed?  no  Does the patient measure his/her own blood pressure or blood glucose at home?  no   Does the patient have any problems obtaining medications due to transportation or finances?   no  Understanding of regimen: fair Understanding of indications: fair Potential of compliance: poor Patient understands to avoid NSAIDs. Patient understands to avoid decongestants.  Issues to address at subsequent visits: Compliance   Pharmacist comments:  [name] [name] is a pleasant 35 yo M presenting without a medication list. He states that he has been taking furosemide 40 mg BID instead of daily lately since he has been drinking a lot of water lately. He also has not been taking amlodipine, losartan or spironolactone 2/2 running out of refills. He did not have any other medication-related questions or concerns for me at this time.   Ruta Hinds. Velva Harman, PharmD, BCPS, CPP Clinical Pharmacist Pager: [phone] Phone: [phone] 05/01/2016 2:57 PM      Time with patient: 10 minutes Preparation and documentation time: 2 minutes Total time: 12 minutes

## 2016-05-01 NOTE — Patient Instructions (Addendum)
Routine lab work today. Will notify you of abnormal results, otherwise no news is good news!  START Entresto 49/51 mg: Take 1 tablet twice daily. Use 30 day free card, then monthly copay card to SAVE $$$!  Return in 2 weeks for labs.  Will schedule you for an echocardiogram at [facility]. Address: [address] ([place]), [place], [place] [address]  Phone: [phone]  Follow up 2 months with Dr. Daphene Calamity.  Do the following things EVERYDAY: 1) Weigh yourself in the morning before breakfast. Write it down and keep it in a log. 2) Take your medicines as prescribed 3) Eat low salt foods-Limit salt (sodium) to 2000 mg per day.  4) Stay as active as you can everyday 5) Limit all fluids for the day to less than 2 liters

## 2016-05-01 NOTE — Progress Notes (Signed)
Advanced Heart Failure Clinic Note   Patient ID: [name] [name], male   DOB: Aug 16, 1980, 35 y.o.   MRN: [id number]  Primary PCP: [place]  Primary Cardiologist: Dr. Haroldine Laws  HPI: [name] [name] is a 35 y.o.male with known history of NICM, hypertension and CKD stage II (baseline Cr 1.5-1.7).   He was first diagnosed with HF in 2012 and his EF recovered and was instructed to stop taking carvedilol, spironolactone, and lasix by previous cardiologist. Admitted to [facility] 08/01/39 for acute systolic heart failure. 12/31/11 ProBNP 3777. HIV nonreactive. Thyroid panel normal. Renal ultrasound was negative for obstruction. EF 20%. Discharge weight 193 lbs.   Admitted 05/30/2015 with HTN urgency with troponin elevation likely consistent with myocardial strain/demand ischemia in the setting of medication non compliance, having run out 2-3 weeks ago and not refilling them. Placed back on his coreg, hydralazine, and spiro and was symptomatically stable on discharge. No ACE/ARB with CKD and does not tolerate nitrates with headaches. Discharge weight 193 lbs.  We have not seen him in a year. Last echo 11/16 with EF 20-25%. Says he is now back working at [name] and has insurance so wanted to come back. Feels good. Denies SOB, CP or edema. Constantly moving at work. Taking hydralazine, lasix and carvedilol. Not taking amlodipine, spiro or losartan.    ECHO 06/13/13: EF 20-25% ECHO 09/26/2014: EF 40%  ECHO 05/31/15 EF 20-25%  06/14/13: K 3.6 Cr 2.00 06/27/14 K 3.7 Creatinine 1.42  09/27/2014: K 3.6 Creatinine 1.76  10/03/14: K 3.9 Creatinine 1.99   ROS: All systems negative except as listed in HPI, PMH and Problem List.  Past Medical History:  Diagnosis Date  . Chronic systolic heart failure ([place])   . CKD (chronic kidney disease) stage 2, GFR 60-89 ml/min   . Essential hypertension, benign   . History of pneumonia   . Mitral regurgitation    Moderate  . Noncompliance   . Nonischemic  cardiomyopathy ([place])    Normal coronaries May 2012, LVEF < 20%    Current Outpatient Prescriptions  Medication Sig Dispense Refill  . acetaminophen (TYLENOL) 500 MG tablet Take 500 mg by mouth every 6 (six) hours as needed.    . carvedilol (COREG) 25 MG tablet Take 1 tablet (25 mg total) by mouth 2 (two) times daily with a meal. 60 tablet 0  . furosemide (LASIX) 40 MG tablet Take 40 mg by mouth 2 (two) times daily.    . hydrALAZINE (APRESOLINE) 100 MG tablet Take 1 tablet (100 mg total) by mouth 3 (three) times daily. 90 tablet 0   No current facility-administered medications for this encounter.     Vitals:   05/01/16 1441  BP: (!) 150/100  BP Location: Right Arm  Patient Position: Sitting  Cuff Size: Normal  Pulse: 80  SpO2: 96%  Weight: 210 lb (95.3 kg)    PHYSICAL EXAM: General:  Well appearing. No resp difficulty  HEENT: normal Neck: supple. JVP 5-6 with mild HJR . Carotids 2+ bilaterally; no bruits. No lymphadenopathy or thryomegaly appreciated. Cor: PMI normal. RRR. No M/G/R. Lungs: CTA, normal effort Abdomen: soft, NT, ND, no HSM. No bruits or masses. +BS Extremities: no cyanosis, clubbing, rash, edema Neuro: alert & orientedx3, cranial nerves grossly intact. Moves all 4 extremities w/o difficulty. Affect pleasant.   ASSESSMENT & PLAN:  1) Chronic systolic HF: NICM, EF 74-08% (06/2013)--> ECHO EF 40% --> ECHO 05/31/15 EF 20-25% - Currently NYHA I - early II despite recurrent severe LV dysfunction and  medication noncompliance - Volume status stable. Continue lasix 40 mg daily. Can take an extra 40 if needed - Continue hydralazine 100 mg TID. Off Imdur 2/2 HA.  - Continue carvedilol 25 bid - Start entresto 49/51 - Repeat echo and check labs - Reinforced medication compliance, limiting fluid intake to < 2 liters per day, and daily weights.  2) HTN :  - BP up. See changes as above.  3) CKD stage II:  (baseline Cr 1.5-1.7) Stable on discharge. - Stressed need for BP  control via medical compliance to reduce risk of progression of CKD. - Labs today - Has appt next month [facility]. 4) Snoring: Has mild polycythemia. Likely has OSA.  - Saw Dr. Gwenette [name] previously. Still needs Sleep study.   [name] Ruzich,MD 3:02 PM

## 2016-05-01 NOTE — Addendum Note (Signed)
Encounter addended by: Effie Berkshire, RN on: 05/01/2016  3:19 PM<BR>    Actions taken: Order Entry activity accessed, Diagnosis association updated, Sign clinical note

## 2016-05-15 ENCOUNTER — Ambulatory Visit (HOSPITAL_COMMUNITY)
Admission: RE | Admit: 2016-05-15 | Discharge: 2016-05-15 | Disposition: A | Discharge status: Home / Self Care | Payer: BLUE CROSS/BLUE SHIELD | Source: Ambulatory Visit | Attending: Internal Medicine | Admitting: Internal Medicine

## 2016-05-15 DIAGNOSIS — I5022 Chronic systolic (congestive) heart failure: Secondary | ICD-10-CM

## 2016-05-15 LAB — BASIC METABOLIC PANEL
Anion gap: 7 (ref 5–15)
BUN: 20 mg/dL (ref 6–20)
CHLORIDE: 108 mmol/L (ref 101–111)
CO2: 22 mmol/L (ref 22–32)
Calcium: 9 mg/dL (ref 8.9–10.3)
Creatinine, Ser: 1.79 mg/dL — ABNORMAL HIGH (ref 0.61–1.24)
GFR calc non Af Amer: 47 mL/min — ABNORMAL LOW (ref >60)
GFR, EST AFRICAN AMERICAN: 55 mL/min — AB (ref >60)
Glucose, Bld: 132 mg/dL — ABNORMAL HIGH (ref 65–99)
Potassium: 4 mmol/L (ref 3.5–5.1)
SODIUM: 137 mmol/L (ref 135–145)

## 2016-05-22 ENCOUNTER — Other Ambulatory Visit: Payer: Self-pay

## 2016-05-22 ENCOUNTER — Ambulatory Visit (HOSPITAL_COMMUNITY): Payer: BLUE CROSS/BLUE SHIELD | Attending: Cardiovascular Disease

## 2016-05-22 DIAGNOSIS — I429 Cardiomyopathy, unspecified: Secondary | ICD-10-CM | POA: Diagnosis not present

## 2016-05-22 DIAGNOSIS — I371 Nonrheumatic pulmonary valve insufficiency: Secondary | ICD-10-CM | POA: Insufficient documentation

## 2016-05-22 DIAGNOSIS — I5022 Chronic systolic (congestive) heart failure: Secondary | ICD-10-CM

## 2016-05-22 DIAGNOSIS — I110 Hypertensive heart disease with heart failure: Secondary | ICD-10-CM | POA: Insufficient documentation

## 2016-05-22 DIAGNOSIS — I351 Nonrheumatic aortic (valve) insufficiency: Secondary | ICD-10-CM | POA: Insufficient documentation

## 2016-05-22 DIAGNOSIS — I340 Nonrheumatic mitral (valve) insufficiency: Secondary | ICD-10-CM | POA: Insufficient documentation

## 2016-06-12 ENCOUNTER — Other Ambulatory Visit (HOSPITAL_COMMUNITY): Payer: Self-pay | Admitting: Cardiology

## 2016-06-12 MED ORDER — FUROSEMIDE 40 MG PO TABS: 40.0000 mg | ORAL_TABLET | Freq: Two times a day (BID) | ORAL | 2 refills | Status: DC

## 2016-06-17 ENCOUNTER — Observation Stay (HOSPITAL_COMMUNITY)
Admission: EM | Admit: 2016-06-17 | Discharge: 2016-06-17 | Disposition: A | Discharge status: Home / Self Care | Payer: BLUE CROSS/BLUE SHIELD | Attending: Family Medicine | Admitting: Family Medicine

## 2016-06-17 ENCOUNTER — Encounter (HOSPITAL_COMMUNITY): Payer: Self-pay

## 2016-06-17 ENCOUNTER — Emergency Department (HOSPITAL_COMMUNITY): Payer: BLUE CROSS/BLUE SHIELD

## 2016-06-17 DIAGNOSIS — I340 Nonrheumatic mitral (valve) insufficiency: Secondary | ICD-10-CM | POA: Insufficient documentation

## 2016-06-17 DIAGNOSIS — R079 Chest pain, unspecified: Secondary | ICD-10-CM | POA: Diagnosis not present

## 2016-06-17 DIAGNOSIS — N182 Chronic kidney disease, stage 2 (mild): Secondary | ICD-10-CM | POA: Insufficient documentation

## 2016-06-17 DIAGNOSIS — I10 Essential (primary) hypertension: Secondary | ICD-10-CM | POA: Diagnosis not present

## 2016-06-17 DIAGNOSIS — G4733 Obstructive sleep apnea (adult) (pediatric): Secondary | ICD-10-CM | POA: Insufficient documentation

## 2016-06-17 DIAGNOSIS — I248 Other forms of acute ischemic heart disease: Secondary | ICD-10-CM | POA: Diagnosis not present

## 2016-06-17 DIAGNOSIS — F419 Anxiety disorder, unspecified: Secondary | ICD-10-CM | POA: Insufficient documentation

## 2016-06-17 DIAGNOSIS — E876 Hypokalemia: Secondary | ICD-10-CM | POA: Insufficient documentation

## 2016-06-17 DIAGNOSIS — I429 Cardiomyopathy, unspecified: Secondary | ICD-10-CM | POA: Diagnosis not present

## 2016-06-17 DIAGNOSIS — Z9114 Patient's other noncompliance with medication regimen: Secondary | ICD-10-CM | POA: Diagnosis not present

## 2016-06-17 DIAGNOSIS — I5023 Acute on chronic systolic (congestive) heart failure: Secondary | ICD-10-CM

## 2016-06-17 DIAGNOSIS — I5043 Acute on chronic combined systolic (congestive) and diastolic (congestive) heart failure: Secondary | ICD-10-CM | POA: Insufficient documentation

## 2016-06-17 DIAGNOSIS — R0789 Other chest pain: Principal | ICD-10-CM | POA: Insufficient documentation

## 2016-06-17 DIAGNOSIS — I130 Hypertensive heart and chronic kidney disease with heart failure and stage 1 through stage 4 chronic kidney disease, or unspecified chronic kidney disease: Secondary | ICD-10-CM | POA: Insufficient documentation

## 2016-06-17 DIAGNOSIS — R0602 Shortness of breath: Secondary | ICD-10-CM | POA: Diagnosis not present

## 2016-06-17 DIAGNOSIS — Z79899 Other long term (current) drug therapy: Secondary | ICD-10-CM | POA: Insufficient documentation

## 2016-06-17 DIAGNOSIS — I428 Other cardiomyopathies: Secondary | ICD-10-CM

## 2016-06-17 DIAGNOSIS — N183 Chronic kidney disease, stage 3 (moderate): Secondary | ICD-10-CM | POA: Diagnosis not present

## 2016-06-17 LAB — CBC WITH DIFFERENTIAL/PLATELET
BASOS ABS: 0 10*3/uL (ref 0.0–0.1)
BASOS PCT: 0 %
EOS PCT: 2 %
Eosinophils Absolute: 0.1 10*3/uL (ref 0.0–0.7)
HCT: 47.2 % (ref 39.0–52.0)
Hemoglobin: 15.9 g/dL (ref 13.0–17.0)
Lymphocytes Relative: 26 %
Lymphs Abs: 1.9 10*3/uL (ref 0.7–4.0)
MCH: 26.9 pg (ref 26.0–34.0)
MCHC: 33.7 g/dL (ref 30.0–36.0)
MCV: 79.7 fL (ref 78.0–100.0)
MONO ABS: 0.6 10*3/uL (ref 0.1–1.0)
Monocytes Relative: 7 %
Neutro Abs: 4.8 10*3/uL (ref 1.7–7.7)
Neutrophils Relative %: 65 %
PLATELETS: 206 10*3/uL (ref 150–400)
RBC: 5.92 MIL/uL — ABNORMAL HIGH (ref 4.22–5.81)
RDW: 16.3 % — AB (ref 11.5–15.5)
WBC: 7.5 10*3/uL (ref 4.0–10.5)

## 2016-06-17 LAB — BASIC METABOLIC PANEL
ANION GAP: 8 (ref 5–15)
BUN: 21 mg/dL — ABNORMAL HIGH (ref 6–20)
CALCIUM: 8.8 mg/dL — AB (ref 8.9–10.3)
CO2: 24 mmol/L (ref 22–32)
CREATININE: 1.78 mg/dL — AB (ref 0.61–1.24)
Chloride: 104 mmol/L (ref 101–111)
GFR, EST AFRICAN AMERICAN: 55 mL/min — AB (ref >60)
GFR, EST NON AFRICAN AMERICAN: 48 mL/min — AB (ref >60)
GLUCOSE: 116 mg/dL — AB (ref 65–99)
Potassium: 3.5 mmol/L (ref 3.5–5.1)
Sodium: 136 mmol/L (ref 135–145)

## 2016-06-17 LAB — TROPONIN I
TROPONIN I: 0.17 ng/mL — AB (ref <0.03)
Troponin I: 0.17 ng/mL (ref <0.03)

## 2016-06-17 LAB — BRAIN NATRIURETIC PEPTIDE: B Natriuretic Peptide: 431 pg/mL — ABNORMAL HIGH (ref 0.0–100.0)

## 2016-06-17 MED ORDER — ASPIRIN 81 MG PO CHEW
324.0000 mg | CHEWABLE_TABLET | Freq: Once | ORAL | Status: AC
  Administered 2016-06-17: 324 mg via ORAL
  Filled 2016-06-17: qty 4

## 2016-06-17 MED ORDER — FUROSEMIDE 10 MG/ML IJ SOLN
40.0000 mg | Freq: Once | INTRAMUSCULAR | Status: AC
  Administered 2016-06-17: 40 mg via INTRAVENOUS
  Filled 2016-06-17: qty 4

## 2016-06-17 NOTE — ED Notes (Signed)
Dr Wendee Beavers notified that patient does not want to stay. Patient will be leaving AMA per Dr Wendee Beavers.

## 2016-06-17 NOTE — ED Notes (Signed)
Gave patient meal tray.

## 2016-06-17 NOTE — Consult Note (Signed)
Requesting provider: Dr. Merrily Pew Primary cardiologist: Dr. Pierre Bali Consulting cardiologist: Dr. Satira Sark  Reason for consultation: Chest pain, abnormal troponin I  Clinical Summary [name] [name] is a 35 y.o.male with past medical history outlined below, presenting to the ER complaining of recent "sharp and pulling" chest discomfort located on the left side of his thorax. He states that this started on Sunday, has been recurring without obvious precipitant. He has been anxious about the symptoms, but otherwise does not report any progressive dyspnea on exertion or leg edema. Perhaps some orthopnea in the last few days. He also states that he has had similar recurring chest pain over the last 5 years.  He has a history of noncompliance, but tells me that he has been taking his medications as directed at least over the last 1-2 months. He saw Dr. Haroldine Laws in October. Recent follow-up echocardiogram in November showed improvement in LVEF to the range of 45-50% with severe LVH and grade 1 diastolic dysfunction. He has a nonischemic cardiomyopathy with documented normal coronary arteries by cardiac catheterization in 2012.   No Known Allergies  Home Medications No current facility-administered medications on file prior to encounter.    Current Outpatient Prescriptions on File Prior to Encounter  Medication Sig Dispense Refill  . carvedilol (COREG) 25 MG tablet Take 1 tablet (25 mg total) by mouth 2 (two) times daily with a meal. 60 tablet 0  . furosemide (LASIX) 40 MG tablet Take 1 tablet (40 mg total) by mouth 2 (two) times daily. 60 tablet 2  . hydrALAZINE (APRESOLINE) 100 MG tablet Take 1 tablet (100 mg total) by mouth 3 (three) times daily. 90 tablet 0  . sacubitril-valsartan (ENTRESTO) 49-51 MG Take 1 tablet by mouth 2 (two) times daily. 60 tablet 3  . acetaminophen (TYLENOL) 500 MG tablet Take 500 mg by mouth every 6 (six) hours as needed.       Past Medical  History:  Diagnosis Date  . Chronic systolic heart failure ([place])   . CKD (chronic kidney disease) stage 2, GFR 60-89 ml/min   . Essential hypertension, benign   . History of pneumonia   . Mitral regurgitation    Moderate  . Noncompliance   . Nonischemic cardiomyopathy ([place])    Normal coronaries May 2012, LVEF < 20%    Past Surgical History:  Procedure Laterality Date  . SURGERY SCROTAL / TESTICULAR     Testicular torsion    Family History  Problem Relation Age of Onset  . Breast cancer Mother   . Stroke Father     Social History [name] reports that he has never smoked. He has never used smokeless tobacco. [name] reports that he drinks alcohol.  Review of Systems Complete review of systems negative except as otherwise outlined in the clinical summary and also the following. Denies any palpitations or syncope. No cough, fevers or chills.  Physical Examination Blood pressure 139/88, pulse 68, temperature 98 F (36.7 C), temperature source Oral, resp. rate 22, height 5\' 10"  (1.778 m), weight 210 lb (95.3 kg), SpO2 97 %. No intake or output data in the 24 hours ending 06/17/16 1146  Telemetry: Sinus rhythm.  Gen.: Appears comfortable at rest. HEENT: Conjunctiva and lids normal, oropharynx clear. Neck: Supple, no elevated JVP or carotid bruits, no thyromegaly. Lungs: Clear to auscultation, nonlabored breathing at rest. Cardiac: Regular rate and rhythm, no S3 or significant systolic murmur, no pericardial rub. Abdomen: Soft, nontender, bowel sounds present, no guarding  or rebound. Extremities: No pitting edema, distal pulses 2+. Skin: Warm and dry. Musculoskeletal: No kyphosis. Neuropsychiatric: Alert and oriented x3, affect grossly appropriate.   Lab Results  Basic Metabolic Panel:  Recent Labs Lab 06/17/16 0817  NA 136  K 3.5  CL 104  CO2 24  GLUCOSE 116*  BUN 21*  CREATININE 1.78*  CALCIUM 8.8*    CBC:  Recent Labs Lab 06/17/16 0817    WBC 7.5  NEUTROABS 4.8  HGB 15.9  HCT 47.2  MCV 79.7  PLT 206    Cardiac Enzymes:  Recent Labs Lab 06/17/16 0817  TROPONINI 0.17*    BNP: 431  ECG I personally reviewed the tracing from 06/17/2016 which showed sinus rhythm with left atrial enlargement, LVH and repolarization abnormalities.  Imaging  Chest x-ray 06/17/2016: FINDINGS: Cardiomegaly. No overt edema. No focal opacities or effusions. No acute bony abnormality.  IMPRESSION: Cardiomegaly.  No active disease.  Echocardiogram 05/22/2016: Study Conclusions  - Left ventricle: The cavity size was moderately dilated. Wall   thickness was increased in a pattern of severe LVH. Systolic   function was mildly reduced. The estimated ejection fraction was   in the range of 45% to 50%. Hypokinesis of the anteroseptal and   inferolateral myocardium. There was dyskinesis of the mid to   apical inferolateral wall. Doppler parameters are consistent with   abnormal left ventricular relaxation (grade 1 diastolic   dysfunction). Doppler parameters are consistent with   indeterminate ventricular filling pressure. - Ventricular septum: Septal motion showed abnormal function and   dyssynergy. - Aortic valve: Transvalvular velocity was within the normal range.   There was no stenosis. There was moderate regurgitation directed   along the septum. - Mitral valve: Transvalvular velocity was within the normal range.   There was no evidence for stenosis. There was trivial   regurgitation. - Left atrium: The atrium was severely dilated. - Right ventricle: The cavity size was normal. Wall thickness was   normal. Systolic function was mildly reduced. - Atrial septum: No defect or patent foramen ovale was identified   by color flow Doppler. - Tricuspid valve: There was no regurgitation.  Impression  1. Atypical chest pain with mildly abnormal troponin I level. ECG is chronically abnormal with LVH and repolarization  abnormalities. He has a history of recurring chest pain over the last several years but previously documented normal coronary arteries by cardiac catheterization in 2012 and diagnosis of nonischemic cardiomyopathy. His most recent LVEF improved to the range of 45-50% on medical therapy.  2. Poorly controlled hypertension over time, complicated by medication noncompliance. He states that he is taking his medications regularly over the last few months.  3. History of nonischemic cardiomyopathy, LVEF previously as low as 20%.  4. Mitral regurgitation, trivial as of recent echocardiogram in November.  5. CKD stage 2-3, creatinine 1.8.  Recommendations  Patient is being admitted to the hospitalist service. Would cycle cardiac markers to assess trend: At this point suspect demand ischemia/myocardial strain rather than true ACS at least based on his history and atypical symptoms. He does not report any leg edema or increasing weight, has had some mild orthopnea. May be a component of mild fluid overload or increased LVEDP. Suggest placing him on IV conversion of Lasix for the time being otherwise continue his cardiac regimen. We will follow with you and can help determine whether he needs any follow-up ischemic testing since cardiac catheterization was in 2012.  Satira Sark, M.D., F.A.C.C.

## 2016-06-17 NOTE — ED Triage Notes (Signed)
Pt c/o left sided chest pain x 3 days.  Describes pain as a pulling sensation and says is worse when he is stressed out.  Reports sob and history of chf.  Denies any swelling of extremities.

## 2016-06-17 NOTE — H&P (Signed)
History and Physical    YARON GRASSE AJG:[id number] DOB: [birth year] DOA: 06/17/2016  PCP: Glo Herring., MD Patient coming from: Home  Chief Complaint: chest pain  HPI: [name] [name] is a 35 y.o. male with medical history significant of systolic heart failure, chronic kidney disease, hypertension. Presenting to the hospital complaining of chest discomfort. Chest discomfort is located left-sided pain. Started 3 days ago and has been intermittent. Was insidious. It is not worse with activity and does not radiate. Described as a pulling kind of discomfort. Nothing he is aware of Better or worse.  ED Course: Patient was found to have elevated troponin of 0.17 and we were further consulted for further medical evaluation recommendations.  Review of Systems: As per HPI otherwise 10 point review of systems negative.    Past Medical History:  Diagnosis Date  . Chronic systolic heart failure ([place])   . CKD (chronic kidney disease) stage 2, GFR 60-89 ml/min   . Essential hypertension, benign   . History of pneumonia   . Mitral regurgitation    Moderate  . Noncompliance   . Nonischemic cardiomyopathy ([place])    Normal coronaries May 2012, LVEF < 20%    Past Surgical History:  Procedure Laterality Date  . SURGERY SCROTAL / TESTICULAR     Testicular torsion     reports that he has never smoked. He has never used smokeless tobacco. He reports that he drinks alcohol. He reports that he does not use drugs.  No Known Allergies  Family History  Problem Relation Age of Onset  . Breast cancer Mother   . Stroke Father     Prior to Admission medications   Medication Sig Start Date End Date Taking? Authorizing Provider  carvedilol (COREG) 25 MG tablet Take 1 tablet (25 mg total) by mouth 2 (two) times daily with a meal. 06/01/15  Yes Modena Jansky, MD  furosemide (LASIX) 40 MG tablet Take 1 tablet (40 mg total) by mouth 2 (two) times daily. 06/12/16  Yes Jolaine Artist, MD    hydrALAZINE (APRESOLINE) 100 MG tablet Take 1 tablet (100 mg total) by mouth 3 (three) times daily. 06/01/15  Yes Modena Jansky, MD  sacubitril-valsartan (ENTRESTO) 49-51 MG Take 1 tablet by mouth 2 (two) times daily. 05/01/16  Yes Jolaine Artist, MD  acetaminophen (TYLENOL) 500 MG tablet Take 500 mg by mouth every 6 (six) hours as needed.    Historical Provider, MD    Physical Exam: Vitals:   06/17/16 1000 06/17/16 1030 06/17/16 1100 06/17/16 1130  BP: 135/90 136/89 139/88 140/89  Pulse: 71 72 68 74  Resp: 21 (!) 28 22 23   Temp:      TempSrc:      SpO2: 96% 96% 97% 96%  Weight:      Height:          Constitutional: NAD, calm, comfortable Vitals:   06/17/16 1000 06/17/16 1030 06/17/16 1100 06/17/16 1130  BP: 135/90 136/89 139/88 140/89  Pulse: 71 72 68 74  Resp: 21 (!) 28 22 23   Temp:      TempSrc:      SpO2: 96% 96% 97% 96%  Weight:      Height:       Eyes: PERRL, lids and conjunctivae normal ENMT: Mucous membranes are moist. Posterior pharynx clear of any exudate or lesions.Normal dentition.  Neck: normal, supple, no masses, no thyromegaly Respiratory: clear to auscultation bilaterally, no wheezing, no crackles. Normal respiratory effort. No accessory muscle  use.  Cardiovascular: Regular rate and rhythm, no murmurs / rubs / gallops. No extremity edema. 2+ pedal pulses. No carotid bruits.  Abdomen: no tenderness, no masses palpated. No hepatosplenomegaly. Bowel sounds positive.  Musculoskeletal: no clubbing / cyanosis. No joint deformity upper and lower extremities. Good ROM, no contractures. Normal muscle tone.  Skin: no rashes, lesions, ulcers. No induration Neurologic: CN 3-12 grossly intact. Sensation intact, DTR normal. Strength 5/5 in all 4.  Psychiatric: Normal judgment and insight. Alert and oriented x 3. Normal mood.    Labs on Admission: I have personally reviewed following labs and imaging studies  CBC:  Recent Labs Lab 06/17/16 0817  WBC 7.5   NEUTROABS 4.8  HGB 15.9  HCT 47.2  MCV 79.7  PLT 409   Basic Metabolic Panel:  Recent Labs Lab 06/17/16 0817  NA 136  K 3.5  CL 104  CO2 24  GLUCOSE 116*  BUN 21*  CREATININE 1.78*  CALCIUM 8.8*   GFR: Estimated Creatinine Clearance: 67.1 mL/min (by C-G formula based on SCr of 1.78 mg/dL (H)). Liver Function Tests: No results for input(s): AST, ALT, ALKPHOS, BILITOT, PROT, ALBUMIN in the last 168 hours. No results for input(s): LIPASE, AMYLASE in the last 168 hours. No results for input(s): AMMONIA in the last 168 hours. Coagulation Profile: No results for input(s): INR, PROTIME in the last 168 hours. Cardiac Enzymes:  Recent Labs Lab 06/17/16 0817  TROPONINI 0.17*   BNP (last 3 results) No results for input(s): PROBNP in the last 8760 hours. HbA1C: No results for input(s): HGBA1C in the last 72 hours. CBG: No results for input(s): GLUCAP in the last 168 hours. Lipid Profile: No results for input(s): CHOL, HDL, LDLCALC, TRIG, CHOLHDL, LDLDIRECT in the last 72 hours. Thyroid Function Tests: No results for input(s): TSH, T4TOTAL, FREET4, T3FREE, THYROIDAB in the last 72 hours. Anemia Panel: No results for input(s): VITAMINB12, FOLATE, FERRITIN, TIBC, IRON, RETICCTPCT in the last 72 hours. Urine analysis:    Component Value Date/Time   COLORURINE STRAW (A) 06/13/2013 0743   APPEARANCEUR CLEAR 06/13/2013 0743   LABSPEC 1.015 06/13/2013 0743   PHURINE 6.5 06/13/2013 0743   GLUCOSEU NEGATIVE 06/13/2013 0743   HGBUR NEGATIVE 06/13/2013 0743   BILIRUBINUR NEGATIVE 06/13/2013 0743   KETONESUR NEGATIVE 06/13/2013 0743   PROTEINUR NEGATIVE 06/13/2013 0743   UROBILINOGEN 0.2 06/13/2013 0743   NITRITE NEGATIVE 06/13/2013 0743   LEUKOCYTESUR NEGATIVE 06/13/2013 0743   Sepsis Labs: !!!!!!!!!!!!!!!!!!!!!!!!!!!!!!!!!!!!!!!!!!!! @LABRCNTIP (procalcitonin:4,lacticidven:4) )No results found for this or any previous visit (from the past 240 hour(s)).   Radiological  Exams on Admission: Dg Chest 2 View  Result Date: 06/17/2016 CLINICAL DATA:  Intermittent chest pain and shortness of breath for 3 days. EXAM: CHEST  2 VIEW COMPARISON:  02/27/2015 FINDINGS: Cardiomegaly. No overt edema. No focal opacities or effusions. No acute bony abnormality. IMPRESSION: Cardiomegaly.  No active disease. Electronically Signed   By: Rolm Baptise M.D.   On: 06/17/2016 09:01    EKG: Independently reviewed. Sinus with no st elevation or depressions t wave inversions same as prior ekg  Assessment/Plan Active Problems:   Chest pain - Will obtain troponin levels - Patient reports echocardiogram performed 2 weeks ago as such will not reorder - cardiology consult  Systolic CHF -continue home medication regimen  HTN  - continue B blocker and ace inhibitor   DVT prophylaxis: heparin Code Status: full Family Communication: none at bedside Disposition Plan: telemetry Consults called: cards Admission status: obs   Velvet Bathe MD  [facility] Pager [phone]  If 7PM-7AM, please contact night-coverage [email or web address] Password [email or web address]  06/17/2016, 11:49 AM

## 2016-06-17 NOTE — ED Notes (Signed)
CRITICAL VALUE ALERT  Critical value received:  Troponin 0.17  Date of notification:  06/17/16  Time of notification:  0901  Critical value read back:Yes.    Nurse who received alert:  Charmayne Sheer, RN  MD notified (1st page):  [place]

## 2016-06-17 NOTE — ED Provider Notes (Signed)
[facility] DEPT Provider Note   CSN: [id number] Arrival date & time: 06/17/16  0756  By signing my name below, I, Rayna Sexton, attest that this documentation has been prepared under the direction and in the presence of Merrily Pew, MD. Electronically Signed: Rayna Sexton, ED Scribe. 06/17/16. 8:32 AM.   History   Chief Complaint Chief Complaint  Patient presents with  . Chest Pain    HPI HPI Comments: [name] [name] is a 35 y.o. male with a h/o heart failure, CKD, HTN, mitral regurgitation and CHF who presents to the Emergency Department complaining of mild, waxing and waning, left sided chest tightness x 3 days. Pt states his pain worsens with deep breathing and denies any other modifying factors. He states he has been increasingly stressed since experiencing his tightness. He is unsure what he was doing during the onset of his symptoms. He denies any recent changes in exertional levels or diet. He reports associated nausea. Pt states he was evaluated by his PCP ~2 months ago for anxiety and was given 2 mg valium. His last dose of valium was this morning which he states provided mild relief of his symptoms. He confirms his medical history and states his cardiologist switched him to entresto one month ago. Pt states his BP has been lower since beginning this medication. He states his BP used to be 180-190/100-120 mmHg. He takes 40 mg lasix once daily. Pt denies a h/o DVT/PE. He denies peripheral edema, vomiting, back pain, fever, chills, cough, abdominal distension or other associated symptoms at this time.   The history is provided by the patient and medical records. No language interpreter was used.    Past Medical History:  Diagnosis Date  . Chronic systolic heart failure ([place])   . CKD (chronic kidney disease) stage 2, GFR 60-89 ml/min   . Essential hypertension, benign   . History of pneumonia   . Mitral regurgitation    Moderate  . Noncompliance   . Nonischemic  cardiomyopathy ([place])    Normal coronaries May 2012, LVEF < 20%    Patient Active Problem List   Diagnosis Date Noted  . Chronic systolic CHF (congestive heart failure) ([place])   . Hypertensive emergency 05/30/2015  . Essential hypertension 11/22/2014  . Shift work sleep disorder 10/30/2014  . Obstructive sleep apnea 10/30/2014  . Hypertensive crisis 09/26/2014  . Hypertensive urgency 06/13/2013  . CHF (congestive heart failure) ([place]) 06/13/2013  . CKD (chronic kidney disease) stage 3, GFR 30-59 ml/min 06/13/2013  . Acute on chronic combined systolic and diastolic heart failure ([place]) 06/13/2013  . Nonischemic cardiomyopathy ([place]) 06/13/2013  . HTN (hypertension) 02/25/2012  . Chronic combined systolic and diastolic heart failure ([place]) 01/08/2012  . Elevated troponin 01/01/2012  . Acute on chronic combined systolic and diastolic CHF (congestive heart failure) ([place]) 12/31/2011  . Hypokalemia 12/31/2011  . Chest pain 12/31/2011  . CKD (chronic kidney disease) 12/31/2011  . Renal failure (ARF), acute on chronic ([place]) 12/31/2011    Past Surgical History:  Procedure Laterality Date  . SURGERY SCROTAL / TESTICULAR     Testicular torsion       Home Medications    Prior to Admission medications   Medication Sig Start Date End Date Taking? Authorizing Provider  carvedilol (COREG) 25 MG tablet Take 1 tablet (25 mg total) by mouth 2 (two) times daily with a meal. 06/01/15  Yes Modena Jansky, MD  furosemide (LASIX) 40 MG tablet Take 1 tablet (40 mg total) by mouth 2 (  two) times daily. 06/12/16  Yes Jolaine Artist, MD  hydrALAZINE (APRESOLINE) 100 MG tablet Take 1 tablet (100 mg total) by mouth 3 (three) times daily. 06/01/15  Yes Modena Jansky, MD  sacubitril-valsartan (ENTRESTO) 49-51 MG Take 1 tablet by mouth 2 (two) times daily. 05/01/16  Yes Jolaine Artist, MD  acetaminophen (TYLENOL) 500 MG tablet Take 500 mg by mouth every 6 (six) hours as needed.    Historical  Provider, MD    Family History Family History  Problem Relation Age of Onset  . Breast cancer Mother   . Stroke Father     Social History Social History  Substance Use Topics  . Smoking status: Never Smoker  . Smokeless tobacco: Never Used  . Alcohol use 0.0 oz/week     Comment: Occasionally     Allergies   Patient has no known allergies.   Review of Systems Review of Systems  Constitutional: Negative for chills and fever.  Respiratory: Positive for chest tightness. Negative for cough.   Cardiovascular: Negative for leg swelling.  Gastrointestinal: Positive for nausea. Negative for abdominal distention, abdominal pain and vomiting.  Musculoskeletal: Negative for back pain.  Psychiatric/Behavioral: The patient is nervous/anxious.   All other systems reviewed and are negative.  Physical Exam Updated Vital Signs BP 139/88   Pulse 68   Temp 98 F (36.7 C) (Oral)   Resp 22   Ht 5\' 10"  (1.778 m)   Wt 210 lb (95.3 kg)   SpO2 97%   BMI 30.13 kg/m   Physical Exam  Constitutional: He is oriented to person, place, and time. He appears well-developed and well-nourished.  HENT:  Head: Normocephalic and atraumatic.  Eyes: EOM are normal.  Neck: Normal range of motion.  Cardiovascular: Normal rate, regular rhythm, normal heart sounds and intact distal pulses.  Exam reveals no gallop and no friction rub.   No murmur heard. Pulmonary/Chest: Effort normal and breath sounds normal. No respiratory distress. He has no wheezes. He has no rales. He exhibits no tenderness.  No rash to anterior chest. No chest wall tenderness.   Abdominal: Soft. He exhibits no distension. There is no tenderness.  No fluid wave.   Musculoskeletal: Normal range of motion. He exhibits no edema or tenderness.  No peripheral edema. No calf tenderness. No pain with ROM of the feet. No tenderness to the C, T or L spine.   Neurological: He is alert and oriented to person, place, and time.  Skin: Skin is  warm and dry.  Psychiatric: He has a normal mood and affect. Judgment normal.  Nursing note and vitals reviewed.  ED Treatments / Results  Labs (all labs ordered are listed, but only abnormal results are displayed) Labs Reviewed  CBC WITH DIFFERENTIAL/PLATELET - Abnormal; Notable for the following:       Result Value   RBC 5.92 (*)    RDW 16.3 (*)    All other components within normal limits  BASIC METABOLIC PANEL - Abnormal; Notable for the following:    Glucose, Bld 116 (*)    BUN 21 (*)    Creatinine, Ser 1.78 (*)    Calcium 8.8 (*)    GFR calc non Af Amer 48 (*)    GFR calc Af Amer 55 (*)    All other components within normal limits  TROPONIN I - Abnormal; Notable for the following:    Troponin I 0.17 (*)    All other components within normal limits  BRAIN NATRIURETIC PEPTIDE -  Abnormal; Notable for the following:    B Natriuretic Peptide 431.0 (*)    All other components within normal limits    EKG  EKG Interpretation  Date/Time:  Wednesday June 17 2016 08:09:10 EST Ventricular Rate:  82 PR Interval:  176 QRS Duration: 120 QT Interval:  402 QTC Calculation: 469 R Axis:   -26 Text Interpretation:  Normal sinus rhythm Possible Left atrial enlargement Left ventricular hypertrophy with QRS widening and repolarization abnormality Abnormal ECG No significant change since last tracing Confirmed by [facility] MD, Corene Cornea [phone]) on 06/17/2016 8:16:25 AM       Radiology Dg Chest 2 View  Result Date: 06/17/2016 CLINICAL DATA:  Intermittent chest pain and shortness of breath for 3 days. EXAM: CHEST  2 VIEW COMPARISON:  02/27/2015 FINDINGS: Cardiomegaly. No overt edema. No focal opacities or effusions. No acute bony abnormality. IMPRESSION: Cardiomegaly.  No active disease. Electronically Signed   By: Rolm Baptise M.D.   On: 06/17/2016 09:01    Procedures Procedures  COORDINATION OF CARE: 8:30 AM Discussed next steps with pt. Pt verbalized understanding and is agreeable  with the plan.    Medications Ordered in ED Medications  aspirin chewable tablet 324 mg (324 mg Oral Given 06/17/16 1039)  furosemide (LASIX) injection 40 mg (40 mg Intravenous Given 06/17/16 1039)     Initial Impression / Assessment and Plan / ED Course  I have reviewed the triage vital signs and the nursing notes.  Pertinent labs & imaging results that were available during my care of the patient were reviewed by me and considered in my medical decision making (see chart for details).  Clinical Course      35 year old male with nonischemic cardiomyopathy here with left-sided chest pain for last 3 days that is sometimes worse but always there. No other significant symptoms. Positive troponin, likely related to heart failure exacerbation. Discussed case with cardiology, Dr. Domenic Polite, who recommended admission here for CHF exacerbation and to trend troponins. Discussed case with Dr. Wendee Beavers with triad hospitalists who will admit with cardiology consult.    I personally performed the services described in this documentation, which was scribed in my presence. The recorded information has been reviewed and is accurate.    Final Clinical Impressions(s) / ED Diagnoses   Final diagnoses:  Nonspecific chest pain  Acute on chronic systolic congestive heart failure [facility])    New Prescriptions New Prescriptions   No medications on file     Merrily Pew, MD 06/17/16 1120

## 2016-06-17 NOTE — ED Notes (Signed)
Went into patient's room to update and tell him we have a bed for him upstairs and he will be transferred up to his room in a few minutes. Patient states "I'm ready to leave. I've been here long enough. Can you just take this out of my arm so I can leave." Paged hospitalist.

## 2016-06-23 DIAGNOSIS — N183 Chronic kidney disease, stage 3 (moderate): Secondary | ICD-10-CM | POA: Diagnosis not present

## 2016-06-23 DIAGNOSIS — Z1389 Encounter for screening for other disorder: Secondary | ICD-10-CM | POA: Diagnosis not present

## 2016-06-23 DIAGNOSIS — Z6833 Body mass index (BMI) 33.0-33.9, adult: Secondary | ICD-10-CM | POA: Diagnosis not present

## 2016-06-23 DIAGNOSIS — I10 Essential (primary) hypertension: Secondary | ICD-10-CM | POA: Diagnosis not present

## 2016-07-01 ENCOUNTER — Encounter (HOSPITAL_COMMUNITY): Payer: Self-pay | Admitting: Internal Medicine

## 2016-07-01 ENCOUNTER — Ambulatory Visit (HOSPITAL_COMMUNITY)
Admission: RE | Admit: 2016-07-01 | Discharge: 2016-07-01 | Disposition: A | Discharge status: Home / Self Care | Payer: BLUE CROSS/BLUE SHIELD | Source: Ambulatory Visit | Attending: Internal Medicine | Admitting: Internal Medicine

## 2016-07-01 VITALS — BP 162/90 | HR 81 | Wt 219.2 lb

## 2016-07-01 DIAGNOSIS — I429 Cardiomyopathy, unspecified: Secondary | ICD-10-CM | POA: Diagnosis not present

## 2016-07-01 DIAGNOSIS — I5022 Chronic systolic (congestive) heart failure: Secondary | ICD-10-CM | POA: Insufficient documentation

## 2016-07-01 DIAGNOSIS — I130 Hypertensive heart and chronic kidney disease with heart failure and stage 1 through stage 4 chronic kidney disease, or unspecified chronic kidney disease: Secondary | ICD-10-CM | POA: Insufficient documentation

## 2016-07-01 DIAGNOSIS — D751 Secondary polycythemia: Secondary | ICD-10-CM | POA: Insufficient documentation

## 2016-07-01 DIAGNOSIS — I340 Nonrheumatic mitral (valve) insufficiency: Secondary | ICD-10-CM | POA: Insufficient documentation

## 2016-07-01 DIAGNOSIS — I10 Essential (primary) hypertension: Secondary | ICD-10-CM

## 2016-07-01 DIAGNOSIS — R0683 Snoring: Secondary | ICD-10-CM | POA: Insufficient documentation

## 2016-07-01 DIAGNOSIS — N182 Chronic kidney disease, stage 2 (mild): Secondary | ICD-10-CM | POA: Diagnosis not present

## 2016-07-01 DIAGNOSIS — I351 Nonrheumatic aortic (valve) insufficiency: Secondary | ICD-10-CM

## 2016-07-01 MED ORDER — SACUBITRIL-VALSARTAN 49-51 MG PO TABS: 1.0000 | ORAL_TABLET | Freq: Two times a day (BID) | ORAL | 6 refills | Status: DC

## 2016-07-01 NOTE — Addendum Note (Signed)
Encounter addended by: Scarlette Calico, RN on: 07/01/2016  3:46 PM<BR>    Actions taken: Order list changed, Sign clinical note

## 2016-07-01 NOTE — Progress Notes (Signed)
Advanced Heart Failure Clinic Note   Patient ID: [name] [name], male   DOB: October 08, 1980, 35 y.o.   MRN: [id number]  Primary PCP: [facility]  Primary Cardiologist: Dr. Haroldine Laws  HPI: [name] is a 35 y.o.male with known history of NICM, hypertension and CKD stage II (baseline Cr 1.5-1.7).   He was first diagnosed with HF in 2012 and his EF recovered and was instructed to stop taking carvedilol, spironolactone, and lasix by previous cardiologist. Admitted to [facility] 0/86/76 for acute systolic heart failure. 12/31/11 ProBNP 3777. HIV nonreactive. Thyroid panel normal. Renal ultrasound was negative for obstruction. EF 20%. Discharge weight 193 lbs. Cath with normal coronaries.  Admitted 05/30/2015 with HTN urgency with troponin elevation likely consistent with myocardial strain/demand ischemia in the setting of medication non compliance, having run out 2-3 weeks ago and not refilling them. Placed back on his coreg, hydralazine, and spiro and was symptomatically stable on discharge. No ACE/ARB with CKD and does not tolerate nitrates with headaches. Discharge weight 193 lbs.   We saw him back in October 2017 after 1 year absence. . Continues to work at [name]. Feels good. Denies SOB, CP or edema. Constantly moving at work. Taking hydralazine, lasix and carvedilol. Started Entresto 49/51. Tolerated well but ran out 1 week ago and went to pharmacy but wouldn't fill it. When was taking Entresto SBP was in 130s. About 2 weeks ago went to ER with CP. ECG ok. Troponin flat at 0.17. BNP 431. Symptoms improved with IV lasix  Echo 11/17 (while on Entresto) EF 45-50%  ECHO 06/13/13: EF 20-25% ECHO 09/26/2014: EF 40%  ECHO 05/31/15 EF 20-25%   06/14/13: K 3.6 Cr 2.00 06/27/14 K 3.7 Creatinine 1.42  09/27/2014: K 3.6 Creatinine 1.76  10/03/14: K 3.9 Creatinine 1.99  06/17/16  K 3.2 creatinine 1.7   ROS: All systems negative except as listed in HPI, PMH and Problem List.  Past Medical History:    Diagnosis Date  . Chronic systolic heart failure ([place])   . CKD (chronic kidney disease) stage 2, GFR 60-89 ml/min   . Essential hypertension, benign   . History of pneumonia   . Mitral regurgitation    Moderate  . Noncompliance   . Nonischemic cardiomyopathy ([place])    Normal coronaries May 2012, LVEF < 20%    Current Outpatient Prescriptions  Medication Sig Dispense Refill  . acetaminophen (TYLENOL) 500 MG tablet Take 500 mg by mouth every 6 (six) hours as needed.    . carvedilol (COREG) 25 MG tablet Take 1 tablet (25 mg total) by mouth 2 (two) times daily with a meal. 60 tablet 0  . furosemide (LASIX) 40 MG tablet Take 1 tablet (40 mg total) by mouth 2 (two) times daily. 60 tablet 2  . hydrALAZINE (APRESOLINE) 100 MG tablet Take 1 tablet (100 mg total) by mouth 3 (three) times daily. 90 tablet 0  . sacubitril-valsartan (ENTRESTO) 49-51 MG Take 1 tablet by mouth 2 (two) times daily. (Patient not taking: Reported on 07/01/2016) 60 tablet 3   No current facility-administered medications for this encounter.     Vitals:   07/01/16 1505  BP: (!) 162/90  Pulse: 81  SpO2: 98%  Weight: 219 lb 4 oz (99.5 kg)    PHYSICAL EXAM: General:  Well appearing. No resp difficulty  HEENT: normal Neck: supple. JVP 5-6 . Carotids 2+ bilaterally; no bruits. No lymphadenopathy or thryomegaly appreciated. Cor: PMI normal. RRR. 2/6 AI Lungs: CTA, normal effort Abdomen: soft, NT, ND, no  HSM. No bruits or masses. +BS Extremities: no cyanosis, clubbing, rash, edema Neuro: alert & orientedx3, cranial nerves grossly intact. Moves all 4 extremities w/o difficulty. Affect pleasant.   ASSESSMENT & PLAN:  1) Chronic systolic HF: NICM, EF 64-31% (06/2013)--> ECHO EF 40% --> ECHO 05/31/15 EF 20-25%-> Echo 11/17 45-50% - Currently NYHA I - early II despite recurrent severe LV dysfunction and medication noncompliance - Volume status stable. Continue lasix 40 mg daily. Can take an extra 40 if needed -  Continue hydralazine 100 mg TID. Off Imdur 2/2 HA.  - Continue carvedilol 25 bid - Restart entresto 49/51. Bring back in 4 weeks to titrate to 97/103 - Repeat echo and check labs - Reinforced medication compliance, limiting fluid intake to < 2 liters per day, and daily weights.  2) HTN :  - BP up today. Restart Entresto  3) CKD stage II:  (baseline Cr 1.5-1.7) Stable on discharge. - Stressed need for BP control via medical compliance to reduce risk of progression of CKD. - Creatinine stable on check 2 weeks ago - Encouraged him to establish with [facility]. 4) Snoring: Has mild polycythemia. Likely has OSA.  - Saw Dr. Gwenette Greet previously. Still needs Sleep study.  5) Moderate AI on echo --Need to follow closely. Repeat echo 6 months.   Valgene Deloatch,MD 3:25 PM

## 2016-07-01 NOTE — Patient Instructions (Signed)
Your physician recommends that you schedule a follow-up appointment in: 4 weeks with Abbe Amsterdam D  Your physician recommends that you schedule a follow-up appointment in: 2-3 months

## 2016-08-05 ENCOUNTER — Ambulatory Visit (HOSPITAL_COMMUNITY): Payer: BLUE CROSS/BLUE SHIELD

## 2016-08-26 ENCOUNTER — Ambulatory Visit (HOSPITAL_COMMUNITY): Payer: BLUE CROSS/BLUE SHIELD

## 2016-09-02 ENCOUNTER — Inpatient Hospital Stay (HOSPITAL_COMMUNITY): Admission: RE | Admit: 2016-09-02 | Payer: BLUE CROSS/BLUE SHIELD | Source: Ambulatory Visit

## 2016-09-03 ENCOUNTER — Ambulatory Visit (HOSPITAL_COMMUNITY)
Admission: RE | Admit: 2016-09-03 | Discharge: 2016-09-03 | Disposition: A | Discharge status: Home / Self Care | Payer: BLUE CROSS/BLUE SHIELD | Source: Ambulatory Visit | Attending: Internal Medicine | Admitting: Internal Medicine

## 2016-09-03 ENCOUNTER — Encounter (HOSPITAL_COMMUNITY): Payer: BLUE CROSS/BLUE SHIELD | Admitting: Internal Medicine

## 2016-09-03 ENCOUNTER — Encounter (HOSPITAL_COMMUNITY): Payer: Self-pay | Admitting: Internal Medicine

## 2016-09-03 VITALS — BP 170/82 | HR 71 | Wt 216.5 lb

## 2016-09-03 DIAGNOSIS — N183 Chronic kidney disease, stage 3 unspecified: Secondary | ICD-10-CM

## 2016-09-03 DIAGNOSIS — Z9114 Patient's other noncompliance with medication regimen: Secondary | ICD-10-CM | POA: Insufficient documentation

## 2016-09-03 DIAGNOSIS — I10 Essential (primary) hypertension: Secondary | ICD-10-CM | POA: Diagnosis not present

## 2016-09-03 DIAGNOSIS — D751 Secondary polycythemia: Secondary | ICD-10-CM | POA: Diagnosis not present

## 2016-09-03 DIAGNOSIS — I5042 Chronic combined systolic (congestive) and diastolic (congestive) heart failure: Secondary | ICD-10-CM

## 2016-09-03 DIAGNOSIS — I130 Hypertensive heart and chronic kidney disease with heart failure and stage 1 through stage 4 chronic kidney disease, or unspecified chronic kidney disease: Secondary | ICD-10-CM | POA: Insufficient documentation

## 2016-09-03 DIAGNOSIS — I5022 Chronic systolic (congestive) heart failure: Secondary | ICD-10-CM | POA: Diagnosis not present

## 2016-09-03 DIAGNOSIS — R0683 Snoring: Secondary | ICD-10-CM | POA: Diagnosis not present

## 2016-09-03 DIAGNOSIS — G4733 Obstructive sleep apnea (adult) (pediatric): Secondary | ICD-10-CM | POA: Diagnosis not present

## 2016-09-03 DIAGNOSIS — I429 Cardiomyopathy, unspecified: Secondary | ICD-10-CM | POA: Insufficient documentation

## 2016-09-03 DIAGNOSIS — I340 Nonrheumatic mitral (valve) insufficiency: Secondary | ICD-10-CM | POA: Diagnosis not present

## 2016-09-03 LAB — BASIC METABOLIC PANEL
ANION GAP: 11 (ref 5–15)
BUN: 22 mg/dL — ABNORMAL HIGH (ref 6–20)
CALCIUM: 9 mg/dL (ref 8.9–10.3)
CHLORIDE: 105 mmol/L (ref 101–111)
CO2: 22 mmol/L (ref 22–32)
CREATININE: 1.89 mg/dL — AB (ref 0.61–1.24)
GFR calc non Af Amer: 44 mL/min — ABNORMAL LOW (ref >60)
GFR, EST AFRICAN AMERICAN: 52 mL/min — AB (ref >60)
Glucose, Bld: 77 mg/dL (ref 65–99)
Potassium: 3.7 mmol/L (ref 3.5–5.1)
SODIUM: 138 mmol/L (ref 135–145)

## 2016-09-03 LAB — BRAIN NATRIURETIC PEPTIDE: B NATRIURETIC PEPTIDE 5: 560.9 pg/mL — AB (ref 0.0–100.0)

## 2016-09-03 MED ORDER — SACUBITRIL-VALSARTAN 97-103 MG PO TABS: 1.0000 | ORAL_TABLET | Freq: Two times a day (BID) | ORAL | 3 refills | Status: DC

## 2016-09-03 NOTE — Progress Notes (Signed)
Advanced Heart Failure Clinic Note   Patient ID: [name] [name], male   DOB: 22-Nov-1980, 36 y.o.   MRN: [id number]  Primary PCP: [name] [name]  Primary Cardiologist: Dr. Haroldine [name]  HPI: [name] [name] is a 36 y.o.male with known history of NICM, hypertension and CKD stage II_III (baseline Cr 1.5-1.7).   He was first diagnosed with HF in 2012 and his EF recovered and was instructed to stop taking carvedilol, spironolactone, and lasix by previous cardiologist. Admitted to [facility] 1/85[facility]63 for acute systolic heart failure. 12/31/11 ProBNP 3777. HIV nonreactive. Thyroid panel normal. Renal ultrasound was negative for obstruction. EF 20%. Discharge weight 193 lbs. Cath with normal coronaries.  Admitted 05/30/2015 with HTN urgency with troponin elevation likely consistent with myocardial strain/demand ischemia in the setting of medication non compliance, having run out 2-3 weeks ago and not refilling them. Placed back on his coreg, hydralazine, and spiro and was symptomatically stable on discharge. No ACE/ARB with CKD and does not tolerate nitrates with headaches. Discharge weight 193 lbs.  He presents today for regular follow up.  BP elevated today. Hasn't taken any medications this am.  Says on average missed medicines 1-2 times weekly. Takes his carvedilol at least everyday. Pt states when he takes medications his BP ranges in 120-130 range. 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  Denies any DOE. Occasional palpitations. Denies lightheadedness, dizziness, or orthopnea.   Echo 11/17 (while on Entresto) EF 45-50%  ECHO 06/13/13: EF 20-25% ECHO 09/26/2014: EF 40%  ECHO 05/31/15 EF 20-25%  06/14/13: K 3.6 Cr 2.00 06/27/14 K 3.7 Creatinine 1.42  09/27/2014: K 3.6 Creatinine 1.76  10/03/14: K 3.9 Creatinine 1.99  06/17/16  K 3.2 creatinine 1.7   ROS: All systems negative except as listed in HPI, PMH and Problem List.  Past Medical History:  Diagnosis Date  . Chronic  systolic heart failure ([place])   . CKD (chronic kidney disease) stage 2, GFR 60-89 ml/min   . Essential hypertension, benign   . History of pneumonia   . Mitral regurgitation    Moderate  . Noncompliance   . Nonischemic cardiomyopathy ([place])    Normal coronaries May 2012, LVEF < 20%    Current Outpatient Prescriptions  Medication Sig Dispense Refill  . acetaminophen (TYLENOL) 500 MG tablet Take 500 mg by mouth every 6 (six) hours as needed.    . carvedilol (COREG) 25 MG tablet Take 1 tablet (25 mg total) by mouth 2 (two) times daily with a meal. 60 tablet 0  . furosemide (LASIX) 40 MG tablet Take 1 tablet (40 mg total) by mouth 2 (two) times daily. 60 tablet 2  . hydrALAZINE (APRESOLINE) 100 MG tablet Take 1 tablet (100 mg total) by mouth 3 (three) times daily. 90 tablet 0  . sacubitril-valsartan (ENTRESTO) 49-51 MG Take 1 tablet by mouth 2 (two) times daily. 60 tablet 6   No current facility-administered medications for this encounter.     Vitals:   09/03/16 1521  BP: (!) 170/82  Pulse: 71  SpO2: 100%  Weight: 216 lb 8 oz (98.2 kg)   PHYSICAL EXAM: General:  Well appearing. NAD.   HEENT: Normal Neck: supple. JVP 7-8 cm. Carotids 2+ bilaterally; no bruits. No thyromegaly or nodule noted.  Cor: PMI normal. RRR. 2/6 AI  Lungs: Clear, normal effort Abdomen: soft, NT, ND, no HSM. No bruits or masses. +BS  Extremities: no cyanosis, clubbing, rash, edema Neuro: alert & orientedx3, cranial nerves grossly intact. Moves all  4 extremities w/o difficulty. Affect very pleasant.   ASSESSMENT & PLAN:  1) Chronic systolic HF: NICM, EF 88-87% (06/2013)--> ECHO EF 40% --> ECHO 05/31/15 EF 20-25%-> Echo 11/17 45-50% - NYHA I symptoms despite recurrent severe LV dysfunction and medication noncompliance - Volume status stable on exam. Continue lasix 40 mg daily. Can take an extra 40 if needed - Continue hydralazine 100 mg TID. Off Imdur 2/2 HA.  - Continue carvedilol 25 bid - Increase  Entresto to 97/103 mg BID. BMET today and repeat 10-14 days. - Reinforced medication compliance, limiting fluid intake to < 2 liters per day, and daily weights.  2) HTN :  - Elevated on arrival. Did not take any medications today.  - Stressed importance of compliance.   3) CKD stage III:  (baseline Cr 1.5-1.7)  - BMET today.  - Stressed importance of BP control in setting of ameliorating end-organ damage.  4) Snoring: Has mild polycythemia. Likely has OSA.  - Saw Dr. Gwenette [name] previously. Needs sleep study. Wants to hold off for now.  5) Moderate AI on echo - Need to follow closely. Plan on repeating echo around June this year.   Increase Entresto as above. Labs today and repeat 10-14 days. Follow up with Dr. Haroldine [name] 3 months with repeat Echo.   [name] Friar, PA-C  3:36 PM   Patient seen and examined with [name] Kilts, PA-C. We discussed all aspects of the encounter. I agree with the assessment and plan as stated above.   Overall doing ok despite low EF and medication non-compliance. BP elevated. Long talk about risks of untreated HTN and worsening HF, renal function (including need for HD) and CVA.Will increase Entresto as above. See back in 2-3 months with repeat echo.   Total time spent 45 minutes. Over half that time spent discussing above.   [name], Daniel,MD 10:17 PM

## 2016-09-03 NOTE — Patient Instructions (Signed)
INCREASE Entresto to 97/103mg  twice daily.  Routine lab work today. Will notify you of abnormal results  Repeat Labs in 10-14 days (bmet)  Follow up and echo with Dr.Bensimhon in 3 months

## 2016-09-10 ENCOUNTER — Encounter (HOSPITAL_COMMUNITY): Payer: BLUE CROSS/BLUE SHIELD | Admitting: Internal Medicine

## 2016-09-16 ENCOUNTER — Inpatient Hospital Stay (HOSPITAL_COMMUNITY): Admission: RE | Admit: 2016-09-16 | Payer: BLUE CROSS/BLUE SHIELD | Source: Ambulatory Visit

## 2016-09-22 ENCOUNTER — Ambulatory Visit (INDEPENDENT_AMBULATORY_CARE_PROVIDER_SITE_OTHER): Payer: BLUE CROSS/BLUE SHIELD | Admitting: Urology

## 2016-09-22 DIAGNOSIS — N469 Male infertility, unspecified: Secondary | ICD-10-CM | POA: Diagnosis not present

## 2016-11-18 ENCOUNTER — Other Ambulatory Visit (HOSPITAL_COMMUNITY): Payer: Self-pay | Admitting: Cardiology

## 2016-11-18 MED ORDER — CARVEDILOL 25 MG PO TABS: 25.0000 mg | ORAL_TABLET | Freq: Two times a day (BID) | ORAL | 6 refills | Status: DC

## 2016-12-04 ENCOUNTER — Encounter (HOSPITAL_COMMUNITY): Payer: Self-pay | Admitting: *Deleted

## 2016-12-04 ENCOUNTER — Encounter (HOSPITAL_COMMUNITY): Payer: Self-pay | Admitting: Internal Medicine

## 2016-12-04 ENCOUNTER — Ambulatory Visit (HOSPITAL_COMMUNITY): Admission: RE | Admit: 2016-12-04 | Payer: BLUE CROSS/BLUE SHIELD | Source: Ambulatory Visit

## 2016-12-04 ENCOUNTER — Ambulatory Visit (HOSPITAL_COMMUNITY)
Admission: RE | Admit: 2016-12-04 | Discharge: 2016-12-04 | Disposition: A | Discharge status: Home / Self Care | Payer: BLUE CROSS/BLUE SHIELD | Source: Ambulatory Visit | Attending: Internal Medicine | Admitting: Internal Medicine

## 2016-12-04 VITALS — BP 128/73 | HR 69 | Wt 215.8 lb

## 2016-12-04 DIAGNOSIS — R0683 Snoring: Secondary | ICD-10-CM

## 2016-12-04 DIAGNOSIS — N183 Chronic kidney disease, stage 3 unspecified: Secondary | ICD-10-CM

## 2016-12-04 DIAGNOSIS — I130 Hypertensive heart and chronic kidney disease with heart failure and stage 1 through stage 4 chronic kidney disease, or unspecified chronic kidney disease: Secondary | ICD-10-CM | POA: Diagnosis not present

## 2016-12-04 DIAGNOSIS — I5022 Chronic systolic (congestive) heart failure: Secondary | ICD-10-CM | POA: Diagnosis not present

## 2016-12-04 DIAGNOSIS — I429 Cardiomyopathy, unspecified: Secondary | ICD-10-CM | POA: Diagnosis not present

## 2016-12-04 DIAGNOSIS — I10 Essential (primary) hypertension: Secondary | ICD-10-CM

## 2016-12-04 DIAGNOSIS — I351 Nonrheumatic aortic (valve) insufficiency: Secondary | ICD-10-CM

## 2016-12-04 LAB — BASIC METABOLIC PANEL
Anion gap: 7 (ref 5–15)
BUN: 23 mg/dL — AB (ref 6–20)
CALCIUM: 9 mg/dL (ref 8.9–10.3)
CO2: 23 mmol/L (ref 22–32)
CREATININE: 1.78 mg/dL — AB (ref 0.61–1.24)
Chloride: 107 mmol/L (ref 101–111)
GFR calc Af Amer: 55 mL/min — ABNORMAL LOW (ref >60)
GFR, EST NON AFRICAN AMERICAN: 48 mL/min — AB (ref >60)
GLUCOSE: 92 mg/dL (ref 65–99)
Potassium: 4 mmol/L (ref 3.5–5.1)
Sodium: 137 mmol/L (ref 135–145)

## 2016-12-04 LAB — BRAIN NATRIURETIC PEPTIDE: B NATRIURETIC PEPTIDE 5: 694 pg/mL — AB (ref 0.0–100.0)

## 2016-12-04 MED ORDER — FUROSEMIDE 40 MG PO TABS: 80.0000 mg | ORAL_TABLET | Freq: Every day | ORAL | 2 refills | Status: DC

## 2016-12-04 NOTE — Patient Instructions (Signed)
Labs today (will call for abnormal results, otherwise no news is good news)  Echo and follow up in 2 Months

## 2016-12-04 NOTE — Progress Notes (Signed)
Advanced Heart Failure Clinic Note   Patient ID: KRISHNA DANCEL, male   DOB: 27-Feb-1981, 36 y.o.   MRN: [id number]  Primary PCP: [facility]  Primary Cardiologist: Dr. Haroldine Laws  HPI: Mr. Krotz is a 36 y.o.male with known history of NICM, hypertension and CKD stage II-III (baseline Cr 1.5-1.7).   He was first diagnosed with HF in 2012 and his EF recovered and was instructed to stop taking carvedilol, spironolactone, and lasix by previous cardiologist. Admitted to [facility] 6/31/49 for acute systolic heart failure. 12/31/11 ProBNP 3777. HIV nonreactive. Thyroid panel normal. Renal ultrasound was negative for obstruction. EF 20%. Discharge weight 193 lbs. Cath with normal coronaries.  Admitted 05/30/2015 with HTN urgency with troponin elevation likely consistent with myocardial strain/demand ischemia in the setting of medication non compliance, having run out 2-3 weeks ago and not refilling them. Placed back on his coreg, hydralazine, and spiro and was symptomatically stable on discharge. No ACE/ARB with CKD and does not tolerate nitrates with headaches. Discharge weight 193 lbs.  He presents today for regular follow up. At last visit Entresto increased. Feeling good overall.  Working at [name]. Going to [employer] for [employer]. Currently limited to 12 credit hours due to academic probation from missing classes with his health issues.  Having slight headache at times, usually when he misses his medications and then gets back on them. Doesn't miss coreg or Entresto, but at times skips hydralazine. Checks BP at home, running in 120-130s.  Denies any DOE. Walking and pushing heavy loads at work without SOB. Denies palpitations, lightheadedness, or dizziness.  He is taking lasix 80 mg daily.   Echo 11/17 (while on Entresto) EF 45-50%  ECHO 06/13/13: EF 20-25% ECHO 09/26/2014: EF 40%  ECHO 05/31/15 EF 20-25%  06/14/13: K 3.6 Cr 2.00 06/27/14 K 3.7 Creatinine 1.42  09/27/2014: K 3.6  Creatinine 1.76  10/03/14: K 3.9 Creatinine 1.99  06/17/16  K 3.2 creatinine 1.7   Review of systems complete and found to be negative unless listed in HPI.    Past Medical History:  Diagnosis Date  . Chronic systolic heart failure ([place])   . CKD (chronic kidney disease) stage 2, GFR 60-89 ml/min   . Essential hypertension, benign   . History of pneumonia   . Mitral regurgitation    Moderate  . Noncompliance   . Nonischemic cardiomyopathy ([place])    Normal coronaries May 2012, LVEF < 20%    Current Outpatient Prescriptions  Medication Sig Dispense Refill  . acetaminophen (TYLENOL) 500 MG tablet Take 500 mg by mouth every 6 (six) hours as needed.    . carvedilol (COREG) 25 MG tablet Take 1 tablet (25 mg total) by mouth 2 (two) times daily with a meal. 60 tablet 6  . hydrALAZINE (APRESOLINE) 100 MG tablet Take 1 tablet (100 mg total) by mouth 3 (three) times daily. 90 tablet 0  . sacubitril-valsartan (ENTRESTO) 97-103 MG Take 1 tablet by mouth 2 (two) times daily. 60 tablet 3  . furosemide (LASIX) 40 MG tablet Take 1 tablet (40 mg total) by mouth 2 (two) times daily. (Patient taking differently: Take 40 mg by mouth daily. ) 60 tablet 2   No current facility-administered medications for this encounter.     Vitals:   12/04/16 1507  BP: 128/73  Pulse: 69  SpO2: 100%  Weight: 215 lb 12.8 oz (97.9 kg)   Wt Readings from Last 3 Encounters:  12/04/16 215 lb 12.8 oz (97.9 kg)  09/03/16 216 lb 8  oz (98.2 kg)  07/01/16 219 lb 4 oz (99.5 kg)    PHYSICAL EXAM: General: Well appearing. No resp difficulty. HEENT: normal Neck: supple. JVD 6-7. Carotids 2+ bilat; no bruits. No thyromegaly or nodule noted. Cor: PMI nondisplaced. RRR, 2/6 AI. Lungs: CTAB, normal effort. Abdomen: soft, non-tender, distended, no HSM. No bruits or masses. +BS  Extremities: no cyanosis, clubbing, rash, R and LLE no edema.  Neuro: alert & orientedx3, cranial nerves grossly intact. moves all 4 extremities w/o  difficulty. Affect pleasant   ASSESSMENT & PLAN:  1) Chronic systolic HF: NICM, EF 09-73% (06/2013)--> ECHO EF 40% --> ECHO 05/31/15 EF 20-25%-> Echo 11/17 45-50% - NYHA 1 symptoms.  - Volume status stable on exam.  - Taking lasix 80 mg daily.  OK to continue this. BMET today.  - Continue hydralazine 100 mg TID. Off Imdur 2/2 HA.  - Continue carvedilol 25 bid - Continue Entresto 97/103 mg BID. BMET today.  - Reinforced fluid restriction to < 2 L daily, sodium restriction to less than 2000 mg daily, and the importance of daily weights.   2) HTN :  - Stable. Encouraged medication of compliance.  3) CKD stage III:  (baseline Cr 1.5-1.7)  - BMET today.  - Stressed importance of BP control.  4) Snoring: Has mild polycythemia. Likely has OSA.  - Continues to want to hold off of sleep study for now.  5) Moderate AI on echo - Need to follow closely.  Reschedule echo. Needs in next few weeks.   Labs today. Stable on current meds. Stressed compliance of hydralazine.   Wrote letter saying pt physically capable of attending his pre-law classes at [facility] [employer] [facility] [employer] per patient request.     RTC 2 months. Needs Echo as above.   Shirley Friar, PA-C  3:17 PM

## 2016-12-17 ENCOUNTER — Emergency Department (HOSPITAL_COMMUNITY)
Admission: EM | Admit: 2016-12-17 | Discharge: 2016-12-17 | Disposition: A | Discharge status: Home / Self Care | Payer: BLUE CROSS/BLUE SHIELD | Attending: Emergency Medicine | Admitting: Emergency Medicine

## 2016-12-17 ENCOUNTER — Encounter (HOSPITAL_COMMUNITY): Payer: Self-pay | Admitting: Emergency Medicine

## 2016-12-17 DIAGNOSIS — Z79899 Other long term (current) drug therapy: Secondary | ICD-10-CM | POA: Insufficient documentation

## 2016-12-17 DIAGNOSIS — N183 Chronic kidney disease, stage 3 (moderate): Secondary | ICD-10-CM | POA: Diagnosis not present

## 2016-12-17 DIAGNOSIS — I129 Hypertensive chronic kidney disease with stage 1 through stage 4 chronic kidney disease, or unspecified chronic kidney disease: Secondary | ICD-10-CM | POA: Insufficient documentation

## 2016-12-17 DIAGNOSIS — L600 Ingrowing nail: Secondary | ICD-10-CM | POA: Diagnosis not present

## 2016-12-17 DIAGNOSIS — I5042 Chronic combined systolic (congestive) and diastolic (congestive) heart failure: Secondary | ICD-10-CM | POA: Insufficient documentation

## 2016-12-17 MED ORDER — TRAMADOL HCL 50 MG PO TABS: 50.0000 mg | ORAL_TABLET | Freq: Four times a day (QID) | ORAL | 0 refills | Status: DC | PRN

## 2016-12-17 MED ORDER — LIDOCAINE HCL (PF) 2 % IJ SOLN
2.0000 mL | Freq: Once | INTRAMUSCULAR | Status: DC
  Filled 2016-12-17: qty 10

## 2016-12-17 MED ORDER — IBUPROFEN 600 MG PO TABS: 600.0000 mg | ORAL_TABLET | Freq: Four times a day (QID) | ORAL | 0 refills | Status: DC | PRN

## 2016-12-17 MED ORDER — CEPHALEXIN 500 MG PO CAPS: 500.0000 mg | ORAL_CAPSULE | Freq: Four times a day (QID) | ORAL | 0 refills | Status: DC

## 2016-12-17 NOTE — ED Triage Notes (Signed)
Patient complaining of left great toe pain x 1 month. States "I've got an ingrown toenail." Denies injury.

## 2016-12-17 NOTE — Discharge Instructions (Signed)
Continue soaking your toe in epsom salt water twice daily for 15 minutes each treatment.  You may take the tramadol prescribed for pain relief.  This will make you drowsy - do not drive within 4 hours of taking this medication. Take the entire course of the antibiotics.

## 2016-12-17 NOTE — ED Provider Notes (Signed)
[facility] DEPT Provider Note   CSN: [id number] Arrival date & time: 12/17/16  1731     History   Chief Complaint Chief Complaint  Patient presents with  . Toe Pain    HPI [name] [name] is a 36 y.o. male presenting with a one month history of left great ingrown toenail which has become more swollen, painful and now draining blood and a small amount of pus for the pas several days.  He has used warm epsom salt soaks without relief.  He has scheduled an appointment with Dr. Berline Lopes in 2 weeks, but cannot wait til then due to increased pain..  The history is provided by the patient and the spouse.    Past Medical History:  Diagnosis Date  . Chronic systolic heart failure ([place])   . CKD (chronic kidney disease) stage 2, GFR 60-89 ml/min   . Essential hypertension, benign   . History of pneumonia   . Mitral regurgitation    Moderate  . Noncompliance   . Nonischemic cardiomyopathy ([place])    Normal coronaries May 2012, LVEF < 20%    Patient Active Problem List   Diagnosis Date Noted  . Chronic systolic CHF (congestive heart failure) ([place])   . Hypertensive emergency 05/30/2015  . Essential hypertension 11/22/2014  . Shift work sleep disorder 10/30/2014  . Obstructive sleep apnea 10/30/2014  . Hypertensive crisis 09/26/2014  . Hypertensive urgency 06/13/2013  . CHF (congestive heart failure) ([place]) 06/13/2013  . CKD (chronic kidney disease) stage 3, GFR 30-59 ml/min 06/13/2013  . Nonischemic cardiomyopathy ([place]) 06/13/2013  . HTN (hypertension) 02/25/2012  . Chronic combined systolic and diastolic heart failure ([place]) 01/08/2012  . Elevated troponin 01/01/2012  . Hypokalemia 12/31/2011  . Chest pain 12/31/2011    Past Surgical History:  Procedure Laterality Date  . SURGERY SCROTAL / TESTICULAR     Testicular torsion       Home Medications    Prior to Admission medications   Medication Sig Start Date End Date Taking? Authorizing Provider  acetaminophen  (TYLENOL) 500 MG tablet Take 500 mg by mouth every 6 (six) hours as needed.    [provider]  carvedilol (COREG) 25 MG tablet Take 1 tablet (25 mg total) by mouth 2 (two) times daily with a meal. 11/18/16   Bensimhon, Shaune Pascal, MD  cephALEXin (KEFLEX) 500 MG capsule Take 1 capsule (500 mg total) by mouth 4 (four) times daily. 12/17/16   Evalee Jefferson, PA-C  furosemide (LASIX) 40 MG tablet Take 2 tablets (80 mg total) by mouth daily. 12/04/16   Shirley Friar, PA-C  hydrALAZINE (APRESOLINE) 100 MG tablet Take 1 tablet (100 mg total) by mouth 3 (three) times daily. 06/01/15   Hongalgi, Lenis Dickinson, MD  ibuprofen (ADVIL,MOTRIN) 600 MG tablet Take 1 tablet (600 mg total) by mouth every 6 (six) hours as needed for mild pain. 12/17/16   Shelbia Scinto, Almyra Free, PA-C  sacubitril-valsartan (ENTRESTO) 97-103 MG Take 1 tablet by mouth 2 (two) times daily. 09/03/16   Bensimhon, Shaune Pascal, MD  traMADol (ULTRAM) 50 MG tablet Take 1 tablet (50 mg total) by mouth every 6 (six) hours as needed for moderate pain. 12/17/16   Evalee Jefferson, PA-C    Family History Family History  Problem Relation Age of Onset  . Breast cancer Mother   . Stroke Father     Social History Social History  Substance Use Topics  . Smoking status: Never Smoker  . Smokeless tobacco: Never Used  . Alcohol use  0.0 oz/week     Comment: Occasionally     Allergies   Patient has no known allergies.   Review of Systems Review of Systems  Constitutional: Negative for chills and fever.  Respiratory: Negative for shortness of breath and wheezing.   Musculoskeletal: Positive for arthralgias.  Skin: Positive for wound.  Neurological: Negative for numbness.     Physical Exam Updated Vital Signs BP (!) 158/91 (BP Location: Right Arm)   Pulse 79   Temp 98 F (36.7 C) (Oral)   Resp 20   Ht 5\' 10"  (1.778 m)   Wt 95.3 kg (210 lb)   SpO2 96%   BMI 30.13 kg/m   Physical Exam  Constitutional: He appears well-developed and well-nourished.   HENT:  Head: Atraumatic.  Neck: Normal range of motion.  Cardiovascular:  Pulses equal bilaterally  Musculoskeletal: He exhibits tenderness.  Neurological: He is alert. He has normal strength. He displays normal reflexes. No sensory deficit.  Skin: Skin is warm and dry.  Erythema, edema of lateral nail cuticle of left great toe.  Blood and small amount of drainage from the distal nail edge.  Psychiatric: He has a normal mood and affect.     ED Treatments / Results  Labs (all labs ordered are listed, but only abnormal results are displayed) Labs Reviewed - No data to display  EKG  EKG Interpretation None       Radiology No results found.  Procedures .Nail Removal Date/Time: 12/17/2016 6:44 PM Performed by: Evalee Jefferson Authorized by: Evalee Jefferson   Consent:    Consent obtained:  Verbal   Consent given by:  Patient   Risks discussed:  Bleeding and pain   Alternatives discussed:  No treatment and alternative treatment Location:    Foot:  L big toe Pre-procedure details:    Skin preparation:  Betadine Anesthesia (see MAR for exact dosages):    Anesthesia method:  Nerve block   Block needle gauge:  25 G   Block anesthetic:  Lidocaine 1% w/o epi   Block injection procedure:  Anatomic landmarks identified Nail Removal:    Nail removed:  Partial Trephination:    Subungual hematoma drained: no   Ingrown nail:    Wedge excision of skin: no     Nail matrix removed or ablated:  None Post-procedure details:    Dressing:  Post-op shoe and 4x4 sterile gauze   Patient tolerance of procedure:  Tolerated well, no immediate complications Comments:     Toenail ingrown spike/wedge of nail removed.   (including critical care time)    Medications Ordered in ED Medications - No data to display   Initial Impression / Assessment and Plan / ED Course  I have reviewed the triage vital signs and the nursing notes.  Pertinent labs & imaging results that were available during  my care of the patient were reviewed by me and considered in my medical decision making (see chart for details).     Pt with ingrown infected toenail.  Keflex, ibu, tramadol, continued warm epsom salt soaks.  Prn f/u with podiatry.   Final Clinical Impressions(s) / ED Diagnoses   Final diagnoses:  Ingrown toenail of left foot with infection    New Prescriptions Discharge Medication List as of 12/17/2016  7:06 PM    START taking these medications   Details  cephALEXin (KEFLEX) 500 MG capsule Take 1 capsule (500 mg total) by mouth 4 (four) times daily., Starting Thu 12/17/2016, Print    ibuprofen (ADVIL,MOTRIN)  600 MG tablet Take 1 tablet (600 mg total) by mouth every 6 (six) hours as needed for mild pain., Starting Thu 12/17/2016, Print    traMADol (ULTRAM) 50 MG tablet Take 1 tablet (50 mg total) by mouth every 6 (six) hours as needed for moderate pain., Starting Thu 12/17/2016, Print         Evalee Jefferson, PA-C 12/18/16 0259    Forde Dandy, MD 12/22/16 2147

## 2016-12-29 DIAGNOSIS — M79672 Pain in left foot: Secondary | ICD-10-CM | POA: Diagnosis not present

## 2016-12-29 DIAGNOSIS — M79675 Pain in left toe(s): Secondary | ICD-10-CM | POA: Diagnosis not present

## 2016-12-29 DIAGNOSIS — L03032 Cellulitis of left toe: Secondary | ICD-10-CM | POA: Diagnosis not present

## 2016-12-29 DIAGNOSIS — L600 Ingrowing nail: Secondary | ICD-10-CM | POA: Diagnosis not present

## 2017-02-08 ENCOUNTER — Encounter (HOSPITAL_COMMUNITY): Payer: BLUE CROSS/BLUE SHIELD | Admitting: Internal Medicine

## 2017-02-08 ENCOUNTER — Ambulatory Visit (HOSPITAL_COMMUNITY): Admission: RE | Admit: 2017-02-08 | Payer: BLUE CROSS/BLUE SHIELD | Source: Ambulatory Visit

## 2017-04-01 ENCOUNTER — Ambulatory Visit (HOSPITAL_COMMUNITY)
Admission: RE | Admit: 2017-04-01 | Discharge: 2017-04-01 | Disposition: A | Discharge status: Home / Self Care | Payer: BLUE CROSS/BLUE SHIELD | Source: Ambulatory Visit | Attending: Internal Medicine | Admitting: Internal Medicine

## 2017-04-01 ENCOUNTER — Encounter (HOSPITAL_COMMUNITY): Payer: Self-pay

## 2017-04-01 ENCOUNTER — Ambulatory Visit (HOSPITAL_BASED_OUTPATIENT_CLINIC_OR_DEPARTMENT_OTHER)
Admission: RE | Admit: 2017-04-01 | Discharge: 2017-04-01 | Disposition: A | Discharge status: Home / Self Care | Payer: BLUE CROSS/BLUE SHIELD | Source: Ambulatory Visit | Attending: Internal Medicine | Admitting: Internal Medicine

## 2017-04-01 VITALS — BP 134/82 | HR 79 | Wt 231.8 lb

## 2017-04-01 DIAGNOSIS — I5042 Chronic combined systolic (congestive) and diastolic (congestive) heart failure: Secondary | ICD-10-CM | POA: Diagnosis not present

## 2017-04-01 DIAGNOSIS — I351 Nonrheumatic aortic (valve) insufficiency: Secondary | ICD-10-CM

## 2017-04-01 DIAGNOSIS — D751 Secondary polycythemia: Secondary | ICD-10-CM | POA: Diagnosis not present

## 2017-04-01 DIAGNOSIS — I5022 Chronic systolic (congestive) heart failure: Secondary | ICD-10-CM | POA: Diagnosis not present

## 2017-04-01 DIAGNOSIS — I420 Dilated cardiomyopathy: Secondary | ICD-10-CM | POA: Insufficient documentation

## 2017-04-01 DIAGNOSIS — Z79899 Other long term (current) drug therapy: Secondary | ICD-10-CM | POA: Diagnosis not present

## 2017-04-01 DIAGNOSIS — N183 Chronic kidney disease, stage 3 (moderate): Secondary | ICD-10-CM | POA: Diagnosis not present

## 2017-04-01 DIAGNOSIS — R0683 Snoring: Secondary | ICD-10-CM | POA: Diagnosis not present

## 2017-04-01 DIAGNOSIS — Z9114 Patient's other noncompliance with medication regimen: Secondary | ICD-10-CM | POA: Insufficient documentation

## 2017-04-01 DIAGNOSIS — I083 Combined rheumatic disorders of mitral, aortic and tricuspid valves: Secondary | ICD-10-CM | POA: Diagnosis not present

## 2017-04-01 DIAGNOSIS — I429 Cardiomyopathy, unspecified: Secondary | ICD-10-CM | POA: Insufficient documentation

## 2017-04-01 DIAGNOSIS — I130 Hypertensive heart and chronic kidney disease with heart failure and stage 1 through stage 4 chronic kidney disease, or unspecified chronic kidney disease: Secondary | ICD-10-CM | POA: Insufficient documentation

## 2017-04-01 DIAGNOSIS — I10 Essential (primary) hypertension: Secondary | ICD-10-CM

## 2017-04-01 LAB — ECHOCARDIOGRAM COMPLETE
AVLVOTPG: 3 mmHg
AVPHT: 391 ms
CHL CUP PV REG GRAD DIAS: 5 mmHg
CHL CUP REG VEL DIAS: 117 cm/s
E decel time: 176 msec
E/e' ratio: 18.09
FS: 32 % (ref 28–44)
IV/PV OW: 1
LA diam index: 1.96 cm/m2
LA vol A4C: 59.2 ml
LA vol: 70.5 mL
LASIZE: 43 mm
LAVOLIN: 32.1 mL/m2
LDCA: 3.46 cm2
LEFT ATRIUM END SYS DIAM: 43 mm
LV E/e'average: 18.09
LV PW d: 12.7 mm — AB (ref 0.6–1.1)
LV TDI E'MEDIAL: 4.68
LV e' LATERAL: 4.03 cm/s
LVEEMED: 18.09
LVOT SV: 51 mL
LVOT VTI: 14.8 cm
LVOT diameter: 21 mm
LVOTPV: 87.7 cm/s
Lateral S' vel: 13.8 cm/s
MV Dec: 176
MV Peak grad: 2 mmHg
MV pk E vel: 72.9 m/s
MVPKAVEL: 54.5 m/s
TAPSE: 19.1 mm
TDI e' lateral: 4.03
Weight: 3708 oz

## 2017-04-01 MED ORDER — HYDRALAZINE HCL 25 MG PO TABS: 25.0000 mg | ORAL_TABLET | Freq: Three times a day (TID) | ORAL | 3 refills | Status: DC

## 2017-04-01 NOTE — Patient Instructions (Signed)
Start Hydralazine 25 mg (1 tab), three times a day  Your physician recommends that you schedule a follow-up appointment in: 3 months

## 2017-04-01 NOTE — Progress Notes (Signed)
Advanced Heart Failure Clinic Note   Patient ID: [name] [name], male   DOB: 05/20/1981, 36 y.o.   MRN: [id number]  Primary PCP: [place]  Primary Cardiologist: Dr. Haroldine Laws  HPI: [name] is a 36 y.o.male with systolic HF due to  NICM, hypertension and CKD stage II-III (baseline Cr 1.5-1.7).   He was first diagnosed with HF in 2012 and his EF recovered and was instructed to stop taking carvedilol, spironolactone, and lasix by previous cardiologist. Admitted to [facility] 7/79/39 for acute systolic heart failure. 12/31/11 ProBNP 3777. HIV nonreactive. Thyroid panel normal. Renal ultrasound was negative for obstruction. EF 20%. Discharge weight 193 lbs. Cath with normal coronaries.  Admitted 05/30/2015 with HTN urgency with troponin elevation likely consistent with myocardial strain/demand ischemia in the setting of medication non compliance, having run out 2-3 weeks ago and not refilling them. Placed back on his coreg, hydralazine, and spiro and was symptomatically stable on discharge. No ACE/ARB with CKD and does not tolerate nitrates with headaches. Discharge weight 193 lbs.  Today he returns for HF follow up. Denies SOB/PND/Orthopnea. No lower extremity edema or ab bloating. BP at ~130 at home. Has been off hydralazine for about 1 month. Not exercising. Currently not working.   ECHO 06/13/13: EF 20-25% ECHO 09/26/2014: EF 40%  ECHO 05/31/15 EF 20-25% Echo 11/17 EF 45-50% ECHO 03/2017 EF ~45% Severe AI Personally reviewed.    Review of systems complete and found to be negative unless listed in HPI.    Past Medical History:  Diagnosis Date  . Chronic systolic heart failure ([place])   . CKD (chronic kidney disease) stage 2, GFR 60-89 ml/min   . Essential hypertension, benign   . History of pneumonia   . Mitral regurgitation    Moderate  . Noncompliance   . Nonischemic cardiomyopathy ([place])    Normal coronaries May 2012, LVEF < 20%    Current Outpatient Prescriptions    Medication Sig Dispense Refill  . acetaminophen (TYLENOL) 500 MG tablet Take 500 mg by mouth every 6 (six) hours as needed.    . carvedilol (COREG) 25 MG tablet Take 1 tablet (25 mg total) by mouth 2 (two) times daily with a meal. 60 tablet 6  . furosemide (LASIX) 40 MG tablet Take 2 tablets (80 mg total) by mouth daily. 60 tablet 2  . sacubitril-valsartan (ENTRESTO) 97-103 MG Take 1 tablet by mouth 2 (two) times daily. 60 tablet 3   No current facility-administered medications for this encounter.     Vitals:   04/01/17 1135  BP: 134/82  Pulse: 79  SpO2: 96%  Weight: 231 lb 12 oz (105.1 kg)   Wt Readings from Last 3 Encounters:  04/01/17 231 lb 12 oz (105.1 kg)  12/17/16 210 lb (95.3 kg)  12/04/16 215 lb 12.8 oz (97.9 kg)    PHYSICAL EXAM: General:  Well appearing. No resp difficulty HEENT: normal Neck: supple. no JVD. Carotids 2+ bilat; no bruits. No lymphadenopathy or thryomegaly appreciated. Cor: PMI nondisplaced. Regular rate & rhythm. 2/6 AI  Lungs: clear Abdomen: soft, nontender, nondistended. No hepatosplenomegaly. No bruits or masses. Good bowel sounds. Extremities: no cyanosis, clubbing, rash, edema Neuro: alert & orientedx3, cranial nerves grossly intact. moves all 4 extremities w/o difficulty. Affect pleasant   ASSESSMENT & PLAN:  1) Chronic systolic HF: NICM,  ECHO ~03% in 2013. EF 45-50% in 2016. Today's echo viewed personally in clinic EF 40%. Severe LVH. Moderate AI - Has been out of hydralazine for 1 month. But  BP not too bad  - Remains NYHA I-II Volume status stable. - Continue lasix at current dose.  - On goal dose carvedilol and entresto.  - Add 25 mg hydralazine three times a day (had HAs with Imdur in past) - With severe LVH will arrange for PYP scan (no MRI with Cr 1.7) - Check TEE for AI - Reinforced fluid restriction to < 2 L daily, sodium restriction to less than 2000 mg daily, and the importance of daily weights.   2) HTN :  - Stable. Add back  hydralazine today 3) CKD stage III:  (baseline Cr 1.5-1.7) -  4) Snoring: Has mild polycythemia. Likely has OSA.  - Set up sleep study. Marland Kitchen  5) Moderate AI on echo - On ECHO looks worse today. Set up TEE .  Glori Bickers, MD  12:02 PM   Patient seen and examined with Darrick Grinder, NP. We discussed all aspects of the encounter. I agree with the assessment and plan as stated above.   Overall remains stable from functional standpoint NYHA I-II. BP fairly well controlled despite being out of hydralazine.   Echo reviewed personally. EF ~40% has severe LVH and worsening AI. Will get TEE to evaluate AI better. Suspect LVH related to HTN but will check PYP scan to exclude TTR amyloid.   Needs sleeps study for OSA.  Encouraged diet and exercise for weight loss.   Total time spent 40 minutes. Over half that time spent discussing above.   Glori Bickers, MD  12:27 PM

## 2017-04-01 NOTE — Progress Notes (Signed)
  Echocardiogram 2D Echocardiogram has been performed.  [name] T Marvelle Caudill 04/01/2017, 11:52 AM

## 2017-04-05 ENCOUNTER — Telehealth (HOSPITAL_COMMUNITY): Payer: Self-pay

## 2017-04-05 NOTE — Telephone Encounter (Signed)
TEE was rescheduled to Friday, September 28 th at 11 am. Pt is aware and will come at 9am on the day of procedure.

## 2017-04-06 ENCOUNTER — Encounter (HOSPITAL_COMMUNITY): Payer: Self-pay

## 2017-04-06 ENCOUNTER — Ambulatory Visit (HOSPITAL_COMMUNITY): Admit: 2017-04-06 | Payer: BLUE CROSS/BLUE SHIELD | Admitting: Internal Medicine

## 2017-04-06 SURGERY — ECHOCARDIOGRAM, TRANSESOPHAGEAL
Anesthesia: Moderate Sedation

## 2017-04-08 ENCOUNTER — Telehealth (HOSPITAL_COMMUNITY): Payer: Self-pay | Admitting: *Deleted

## 2017-04-08 NOTE — Telephone Encounter (Signed)
[name] [name] #[id number]

## 2017-04-09 ENCOUNTER — Ambulatory Visit (HOSPITAL_COMMUNITY)
Admission: RE | Admit: 2017-04-09 | Discharge: 2017-04-09 | Disposition: A | Discharge status: Home / Self Care | Payer: BLUE CROSS/BLUE SHIELD | Source: Ambulatory Visit | Attending: Internal Medicine | Admitting: Internal Medicine

## 2017-04-09 ENCOUNTER — Ambulatory Visit (HOSPITAL_BASED_OUTPATIENT_CLINIC_OR_DEPARTMENT_OTHER): Payer: BLUE CROSS/BLUE SHIELD

## 2017-04-09 ENCOUNTER — Encounter (HOSPITAL_COMMUNITY)
Admission: RE | Disposition: A | Discharge status: Home / Self Care | Payer: Self-pay | Source: Ambulatory Visit | Attending: Internal Medicine

## 2017-04-09 ENCOUNTER — Encounter (HOSPITAL_COMMUNITY): Payer: Self-pay

## 2017-04-09 DIAGNOSIS — N183 Chronic kidney disease, stage 3 (moderate): Secondary | ICD-10-CM | POA: Diagnosis not present

## 2017-04-09 DIAGNOSIS — I429 Cardiomyopathy, unspecified: Secondary | ICD-10-CM | POA: Insufficient documentation

## 2017-04-09 DIAGNOSIS — I130 Hypertensive heart and chronic kidney disease with heart failure and stage 1 through stage 4 chronic kidney disease, or unspecified chronic kidney disease: Secondary | ICD-10-CM | POA: Insufficient documentation

## 2017-04-09 DIAGNOSIS — D751 Secondary polycythemia: Secondary | ICD-10-CM | POA: Diagnosis not present

## 2017-04-09 DIAGNOSIS — I340 Nonrheumatic mitral (valve) insufficiency: Secondary | ICD-10-CM | POA: Insufficient documentation

## 2017-04-09 DIAGNOSIS — Z79899 Other long term (current) drug therapy: Secondary | ICD-10-CM | POA: Insufficient documentation

## 2017-04-09 DIAGNOSIS — I359 Nonrheumatic aortic valve disorder, unspecified: Secondary | ICD-10-CM | POA: Diagnosis not present

## 2017-04-09 DIAGNOSIS — I351 Nonrheumatic aortic (valve) insufficiency: Secondary | ICD-10-CM | POA: Diagnosis not present

## 2017-04-09 DIAGNOSIS — I5022 Chronic systolic (congestive) heart failure: Secondary | ICD-10-CM | POA: Diagnosis not present

## 2017-04-09 DIAGNOSIS — N182 Chronic kidney disease, stage 2 (mild): Secondary | ICD-10-CM | POA: Insufficient documentation

## 2017-04-09 HISTORY — DX: Headache, unspecified: R51.9

## 2017-04-09 HISTORY — DX: Headache: R51

## 2017-04-09 HISTORY — PX: TEE WITHOUT CARDIOVERSION: SHX5443

## 2017-04-09 SURGERY — ECHOCARDIOGRAM, TRANSESOPHAGEAL
Anesthesia: Moderate Sedation

## 2017-04-09 MED ORDER — DIPHENHYDRAMINE HCL 50 MG/ML IJ SOLN
INTRAMUSCULAR | Status: AC
  Filled 2017-04-09: qty 1

## 2017-04-09 MED ORDER — LIDOCAINE VISCOUS 2 % MT SOLN
OROMUCOSAL | Status: DC | PRN
  Administered 2017-04-09: 10 mL via OROMUCOSAL

## 2017-04-09 MED ORDER — LIDOCAINE VISCOUS 2 % MT SOLN
OROMUCOSAL | Status: AC
  Filled 2017-04-09: qty 30

## 2017-04-09 MED ORDER — MIDAZOLAM HCL 5 MG/ML IJ SOLN
INTRAMUSCULAR | Status: AC
  Filled 2017-04-09: qty 2

## 2017-04-09 MED ORDER — MIDAZOLAM HCL 10 MG/2ML IJ SOLN
INTRAMUSCULAR | Status: DC | PRN
  Administered 2017-04-09 (×2): 2 mg via INTRAVENOUS
  Administered 2017-04-09: 1 mg via INTRAVENOUS

## 2017-04-09 MED ORDER — FENTANYL CITRATE (PF) 100 MCG/2ML IJ SOLN
INTRAMUSCULAR | Status: DC | PRN
  Administered 2017-04-09: 25 ug via INTRAVENOUS
  Administered 2017-04-09: 50 ug via INTRAVENOUS

## 2017-04-09 MED ORDER — FENTANYL CITRATE (PF) 100 MCG/2ML IJ SOLN
INTRAMUSCULAR | Status: AC
  Filled 2017-04-09: qty 2

## 2017-04-09 MED ORDER — SODIUM CHLORIDE 0.9 % IV SOLN
INTRAVENOUS | Status: DC
  Administered 2017-04-09: 10:00:00 via INTRAVENOUS

## 2017-04-09 NOTE — Discharge Instructions (Signed)

## 2017-04-09 NOTE — H&P (View-Only) (Signed)
Advanced Heart Failure Clinic Note   Patient ID: [name] [name], male   DOB: 06/16/1981, 36 y.o.   MRN: [id number]  Primary PCP: [place]  Primary Cardiologist: Dr. Haroldine Laws  HPI: [name] [name] is a 36 y.o.male with systolic HF due to  NICM, hypertension and CKD stage II-III (baseline Cr 1.5-1.7).   He was first diagnosed with HF in 2012 and his EF recovered and was instructed to stop taking carvedilol, spironolactone, and lasix by previous cardiologist. Admitted to [facility] 3/87/56 for acute systolic heart failure. 12/31/11 ProBNP 3777. HIV nonreactive. Thyroid panel normal. Renal ultrasound was negative for obstruction. EF 20%. Discharge weight 193 lbs. Cath with normal coronaries.  Admitted 05/30/2015 with HTN urgency with troponin elevation likely consistent with myocardial strain/demand ischemia in the setting of medication non compliance, having run out 2-3 weeks ago and not refilling them. Placed back on his coreg, hydralazine, and spiro and was symptomatically stable on discharge. No ACE/ARB with CKD and does not tolerate nitrates with headaches. Discharge weight 193 lbs.  Today he returns for HF follow up. Denies SOB/PND/Orthopnea. No lower extremity edema or ab bloating. BP at ~130 at home. Has been off hydralazine for about 1 month. Not exercising. Currently not working.   ECHO 06/13/13: EF 20-25% ECHO 09/26/2014: EF 40%  ECHO 05/31/15 EF 20-25% Echo 11/17 EF 45-50% ECHO 03/2017 EF ~45% Severe AI Personally reviewed.    Review of systems complete and found to be negative unless listed in HPI.    Past Medical History:  Diagnosis Date  . Chronic systolic heart failure ([place])   . CKD (chronic kidney disease) stage 2, GFR 60-89 ml/min   . Essential hypertension, benign   . History of pneumonia   . Mitral regurgitation    Moderate  . Noncompliance   . Nonischemic cardiomyopathy ([place])    Normal coronaries May 2012, LVEF < 20%    Current Outpatient Prescriptions    Medication Sig Dispense Refill  . acetaminophen (TYLENOL) 500 MG tablet Take 500 mg by mouth every 6 (six) hours as needed.    . carvedilol (COREG) 25 MG tablet Take 1 tablet (25 mg total) by mouth 2 (two) times daily with a meal. 60 tablet 6  . furosemide (LASIX) 40 MG tablet Take 2 tablets (80 mg total) by mouth daily. 60 tablet 2  . sacubitril-valsartan (ENTRESTO) 97-103 MG Take 1 tablet by mouth 2 (two) times daily. 60 tablet 3   No current facility-administered medications for this encounter.     Vitals:   04/01/17 1135  BP: 134/82  Pulse: 79  SpO2: 96%  Weight: 231 lb 12 oz (105.1 kg)   Wt Readings from Last 3 Encounters:  04/01/17 231 lb 12 oz (105.1 kg)  12/17/16 210 lb (95.3 kg)  12/04/16 215 lb 12.8 oz (97.9 kg)    PHYSICAL EXAM: General:  Well appearing. No resp difficulty HEENT: normal Neck: supple. no JVD. Carotids 2+ bilat; no bruits. No lymphadenopathy or thryomegaly appreciated. Cor: PMI nondisplaced. Regular rate & rhythm. 2/6 AI  Lungs: clear Abdomen: soft, nontender, nondistended. No hepatosplenomegaly. No bruits or masses. Good bowel sounds. Extremities: no cyanosis, clubbing, rash, edema Neuro: alert & orientedx3, cranial nerves grossly intact. moves all 4 extremities w/o difficulty. Affect pleasant   ASSESSMENT & PLAN:  1) Chronic systolic HF: NICM,  ECHO ~43% in 2013. EF 45-50% in 2016. Today's echo viewed personally in clinic EF 40%. Severe LVH. Moderate AI - Has been out of hydralazine for 1 month. But  BP not too bad  - Remains NYHA I-II Volume status stable. - Continue lasix at current dose.  - On goal dose carvedilol and entresto.  - Add 25 mg hydralazine three times a day (had HAs with Imdur in past) - With severe LVH will arrange for PYP scan (no MRI with Cr 1.7) - Check TEE for AI - Reinforced fluid restriction to < 2 L daily, sodium restriction to less than 2000 mg daily, and the importance of daily weights.   2) HTN :  - Stable. Add back  hydralazine today 3) CKD stage III:  (baseline Cr 1.5-1.7) -  4) Snoring: Has mild polycythemia. Likely has OSA.  - Set up sleep study. Marland Kitchen  5) Moderate AI on echo - On ECHO looks worse today. Set up TEE .  Glori Bickers, MD  12:02 PM   Patient seen and examined with Darrick Grinder, NP. We discussed all aspects of the encounter. I agree with the assessment and plan as stated above.   Overall remains stable from functional standpoint NYHA I-II. BP fairly well controlled despite being out of hydralazine.   Echo reviewed personally. EF ~40% has severe LVH and worsening AI. Will get TEE to evaluate AI better. Suspect LVH related to HTN but will check PYP scan to exclude TTR amyloid.   Needs sleeps study for OSA.  Encouraged diet and exercise for weight loss.   Total time spent 40 minutes. Over half that time spent discussing above.   Glori Bickers, MD  12:27 PM

## 2017-04-09 NOTE — CV Procedure (Signed)
    TRANSESOPHAGEAL ECHOCARDIOGRAM   NAME:  [name] [name]   MRN: [id number] DOB:  24-Jun-1981   ADMIT DATE: 04/09/2017  INDICATIONS:   PROCEDURE:   Informed consent was obtained prior to the procedure. The risks, benefits and alternatives for the procedure were discussed and the patient comprehended these risks.  Risks include, but are not limited to, cough, sore throat, vomiting, nausea, somnolence, esophageal and stomach trauma or perforation, bleeding, low blood pressure, aspiration, pneumonia, infection, trauma to the teeth and death.    After a procedural time-out, the patient was given 5 mg versed and 75 mcg fentanyl for moderate sedation.  The oropharynx was anesthetized 10 cc of topical 1% viscous lidocaine.  The transesophageal probe was inserted in the esophagus and stomach without difficulty and multiple views were obtained.    COMPLICATIONS:    There were no immediate complications.  FINDINGS:  LEFT VENTRICLE: EF = 40-45%. Global HK  RIGHT VENTRICLE: Normal size and function.   LEFT ATRIUM: Dilated  LEFT ATRIAL APPENDAGE: No thrombus.   RIGHT ATRIUM: Normal  AORTIC VALVE:  Trileaflet. Incomplete coaptation of RCC and NCC. Probable severe AI with Pressure half time ~23ms  MITRAL VALVE:    Normal. Mild MR  TRICUSPID VALVE: Normal. Trivial TR  PULMONIC VALVE: Grossly normal. No PI  INTERATRIAL SEPTUM: No PFO or ASD.  PERICARDIUM: No effusion  DESCENDING AORTA: No plaque   CONCLUSION:  Incomplete coaptation of RCC and NCC of aortic valve with probable severe AI. Will discuss with TCTS to discuss possible valve repair.   Daimen Shovlin,MD 11:33 AM

## 2017-04-09 NOTE — Progress Notes (Signed)
  Echocardiogram Echocardiogram Transesophageal has been performed.  [name] [name] 04/09/2017, 11:43 AM

## 2017-04-09 NOTE — Interval H&P Note (Signed)
History and Physical Interval Note:  04/09/2017 11:13 AM  [name] [name]  has presented today for surgery, with the diagnosis of aortic insufficiency  The various methods of treatment have been discussed with the patient and family. After consideration of risks, benefits and other options for treatment, the patient has consented to  Procedure(s): TRANSESOPHAGEAL ECHOCARDIOGRAM (TEE) (N/A) as a surgical intervention .  The patient's history has been reviewed, patient examined, no change in status, stable for surgery.  I have reviewed the patient's chart and labs.  Questions were answered to the patient's satisfaction.     Bensimhon, Quillian Quince

## 2017-04-11 ENCOUNTER — Encounter (HOSPITAL_COMMUNITY): Payer: Self-pay | Admitting: Internal Medicine

## 2017-04-12 LAB — ECHO TEE: P 1/2 time: 171 ms

## 2017-04-13 ENCOUNTER — Telehealth (HOSPITAL_COMMUNITY): Payer: Self-pay | Admitting: *Deleted

## 2017-04-13 NOTE — Telephone Encounter (Signed)
Per Dr Haroldine Laws pt needs R/L HF to assess aortic valve.  Attempted to call pt to sch and Left message to call back

## 2017-04-14 NOTE — Telephone Encounter (Signed)
Pt sch for R/L HC on 10/9 at 9 am, pt aware and all instructions reviewed w/him via phone

## 2017-04-15 ENCOUNTER — Other Ambulatory Visit (HOSPITAL_COMMUNITY): Payer: Self-pay | Admitting: *Deleted

## 2017-04-15 ENCOUNTER — Encounter (HOSPITAL_COMMUNITY): Payer: BLUE CROSS/BLUE SHIELD

## 2017-04-15 ENCOUNTER — Encounter (HOSPITAL_COMMUNITY)
Admission: RE | Admit: 2017-04-15 | Discharge: 2017-04-15 | Disposition: A | Discharge status: Home / Self Care | Payer: BLUE CROSS/BLUE SHIELD | Source: Ambulatory Visit | Attending: Internal Medicine | Admitting: Internal Medicine

## 2017-04-15 ENCOUNTER — Encounter (HOSPITAL_COMMUNITY): Admission: RE | Admit: 2017-04-15 | Payer: BLUE CROSS/BLUE SHIELD | Source: Ambulatory Visit

## 2017-04-15 DIAGNOSIS — I509 Heart failure, unspecified: Secondary | ICD-10-CM | POA: Diagnosis not present

## 2017-04-15 DIAGNOSIS — I5042 Chronic combined systolic (congestive) and diastolic (congestive) heart failure: Secondary | ICD-10-CM | POA: Diagnosis not present

## 2017-04-15 DIAGNOSIS — I351 Nonrheumatic aortic (valve) insufficiency: Secondary | ICD-10-CM

## 2017-04-15 DIAGNOSIS — C9110 Chronic lymphocytic leukemia of B-cell type not having achieved remission: Secondary | ICD-10-CM | POA: Diagnosis not present

## 2017-04-15 DIAGNOSIS — E859 Amyloidosis, unspecified: Secondary | ICD-10-CM | POA: Diagnosis not present

## 2017-04-15 MED ORDER — TECHNETIUM TC 99M PYROPHOSPHATE
20.0000 | Freq: Once | INTRAVENOUS | Status: DC
  Administered 2017-04-15: 20 via INTRAVENOUS

## 2017-04-20 ENCOUNTER — Other Ambulatory Visit: Payer: Self-pay

## 2017-04-20 ENCOUNTER — Encounter (HOSPITAL_COMMUNITY): Payer: Self-pay | Admitting: Internal Medicine

## 2017-04-20 ENCOUNTER — Encounter (HOSPITAL_COMMUNITY)
Admission: RE | Disposition: A | Discharge status: Home / Self Care | Payer: Self-pay | Source: Ambulatory Visit | Attending: Internal Medicine

## 2017-04-20 ENCOUNTER — Ambulatory Visit (HOSPITAL_COMMUNITY)
Admission: RE | Admit: 2017-04-20 | Discharge: 2017-04-20 | Disposition: A | Discharge status: Home / Self Care | Payer: BLUE CROSS/BLUE SHIELD | Source: Ambulatory Visit | Attending: Internal Medicine | Admitting: Internal Medicine

## 2017-04-20 DIAGNOSIS — Z79899 Other long term (current) drug therapy: Secondary | ICD-10-CM | POA: Diagnosis not present

## 2017-04-20 DIAGNOSIS — I5022 Chronic systolic (congestive) heart failure: Secondary | ICD-10-CM | POA: Diagnosis not present

## 2017-04-20 DIAGNOSIS — I130 Hypertensive heart and chronic kidney disease with heart failure and stage 1 through stage 4 chronic kidney disease, or unspecified chronic kidney disease: Secondary | ICD-10-CM | POA: Insufficient documentation

## 2017-04-20 DIAGNOSIS — I351 Nonrheumatic aortic (valve) insufficiency: Secondary | ICD-10-CM

## 2017-04-20 DIAGNOSIS — N183 Chronic kidney disease, stage 3 (moderate): Secondary | ICD-10-CM | POA: Insufficient documentation

## 2017-04-20 DIAGNOSIS — D751 Secondary polycythemia: Secondary | ICD-10-CM | POA: Diagnosis not present

## 2017-04-20 DIAGNOSIS — R0683 Snoring: Secondary | ICD-10-CM | POA: Insufficient documentation

## 2017-04-20 DIAGNOSIS — I428 Other cardiomyopathies: Secondary | ICD-10-CM | POA: Diagnosis not present

## 2017-04-20 DIAGNOSIS — Z9114 Patient's other noncompliance with medication regimen: Secondary | ICD-10-CM | POA: Insufficient documentation

## 2017-04-20 DIAGNOSIS — I340 Nonrheumatic mitral (valve) insufficiency: Secondary | ICD-10-CM | POA: Insufficient documentation

## 2017-04-20 HISTORY — PX: RIGHT/LEFT HEART CATH AND CORONARY ANGIOGRAPHY: CATH118266

## 2017-04-20 LAB — PROTIME-INR
INR: 0.99
Prothrombin Time: 13 seconds (ref 11.4–15.2)

## 2017-04-20 LAB — BASIC METABOLIC PANEL
Anion gap: 6 (ref 5–15)
BUN: 20 mg/dL (ref 6–20)
CHLORIDE: 106 mmol/L (ref 101–111)
CO2: 24 mmol/L (ref 22–32)
Calcium: 8.8 mg/dL — ABNORMAL LOW (ref 8.9–10.3)
Creatinine, Ser: 1.81 mg/dL — ABNORMAL HIGH (ref 0.61–1.24)
GFR calc Af Amer: 54 mL/min — ABNORMAL LOW (ref >60)
GFR calc non Af Amer: 46 mL/min — ABNORMAL LOW (ref >60)
GLUCOSE: 113 mg/dL — AB (ref 65–99)
Potassium: 4.3 mmol/L (ref 3.5–5.1)
Sodium: 136 mmol/L (ref 135–145)

## 2017-04-20 LAB — POCT I-STAT 3, ART BLOOD GAS (G3+)
ACID-BASE DEFICIT: 2 mmol/L (ref 0.0–2.0)
BICARBONATE: 22.5 mmol/L (ref 20.0–28.0)
O2 Saturation: 99 %
PO2 ART: 139 mmHg — AB (ref 83.0–108.0)
TCO2: 24 mmol/L (ref 22–32)
pCO2 arterial: 38.2 mmHg (ref 32.0–48.0)
pH, Arterial: 7.379 (ref 7.350–7.450)

## 2017-04-20 LAB — CBC
HCT: 45.9 % (ref 39.0–52.0)
Hemoglobin: 15.6 g/dL (ref 13.0–17.0)
MCH: 28 pg (ref 26.0–34.0)
MCHC: 34 g/dL (ref 30.0–36.0)
MCV: 82.3 fL (ref 78.0–100.0)
Platelets: 162 10*3/uL (ref 150–400)
RBC: 5.58 MIL/uL (ref 4.22–5.81)
RDW: 14 % (ref 11.5–15.5)
WBC: 4.7 10*3/uL (ref 4.0–10.5)

## 2017-04-20 LAB — POCT I-STAT 3, VENOUS BLOOD GAS (G3P V)
ACID-BASE DEFICIT: 3 mmol/L — AB (ref 0.0–2.0)
ACID-BASE DEFICIT: 3 mmol/L — AB (ref 0.0–2.0)
Bicarbonate: 23.2 mmol/L (ref 20.0–28.0)
Bicarbonate: 23.5 mmol/L (ref 20.0–28.0)
O2 SAT: 70 %
O2 Saturation: 69 %
PH VEN: 7.331 (ref 7.250–7.430)
TCO2: 25 mmol/L (ref 22–32)
TCO2: 25 mmol/L (ref 22–32)
pCO2, Ven: 44.2 mmHg (ref 44.0–60.0)
pCO2, Ven: 44.5 mmHg (ref 44.0–60.0)
pH, Ven: 7.328 (ref 7.250–7.430)
pO2, Ven: 39 mmHg (ref 32.0–45.0)
pO2, Ven: 39 mmHg (ref 32.0–45.0)

## 2017-04-20 SURGERY — RIGHT/LEFT HEART CATH AND CORONARY ANGIOGRAPHY
Anesthesia: LOCAL

## 2017-04-20 MED ORDER — HEPARIN SODIUM (PORCINE) 1000 UNIT/ML IJ SOLN
INTRAMUSCULAR | Status: AC
  Filled 2017-04-20: qty 1

## 2017-04-20 MED ORDER — SODIUM CHLORIDE 0.9 % IV SOLN
250.0000 mL | INTRAVENOUS | Status: DC | PRN
Start: 2017-04-20 — End: 2017-04-20

## 2017-04-20 MED ORDER — LIDOCAINE HCL 2 % IJ SOLN
INTRAMUSCULAR | Status: AC
  Filled 2017-04-20: qty 10

## 2017-04-20 MED ORDER — SODIUM CHLORIDE 0.9 % IV SOLN
INTRAVENOUS | Status: DC
  Administered 2017-04-20: 08:00:00 via INTRAVENOUS

## 2017-04-20 MED ORDER — IOPAMIDOL (ISOVUE-370) INJECTION 76%
INTRAVENOUS | Status: AC
  Filled 2017-04-20: qty 100

## 2017-04-20 MED ORDER — MIDAZOLAM HCL 2 MG/2ML IJ SOLN
INTRAMUSCULAR | Status: DC | PRN
  Administered 2017-04-20: 1 mg via INTRAVENOUS

## 2017-04-20 MED ORDER — HEPARIN (PORCINE) IN NACL 2-0.9 UNIT/ML-% IJ SOLN
INTRAMUSCULAR | Status: AC | PRN
  Administered 2017-04-20: 1000 mL

## 2017-04-20 MED ORDER — SODIUM CHLORIDE 0.9 % IV SOLN: 250.0000 mL | INTRAVENOUS | Status: DC | PRN

## 2017-04-20 MED ORDER — VERAPAMIL HCL 2.5 MG/ML IV SOLN
INTRAVENOUS | Status: DC | PRN
  Administered 2017-04-20: 10 mL via INTRA_ARTERIAL

## 2017-04-20 MED ORDER — ASPIRIN 81 MG PO CHEW
81.0000 mg | CHEWABLE_TABLET | ORAL | Status: AC
Start: 2017-04-20 — End: 2017-04-20
  Administered 2017-04-20: 81 mg via ORAL

## 2017-04-20 MED ORDER — FENTANYL CITRATE (PF) 100 MCG/2ML IJ SOLN
INTRAMUSCULAR | Status: DC | PRN
  Administered 2017-04-20: 25 ug via INTRAVENOUS

## 2017-04-20 MED ORDER — SODIUM CHLORIDE 0.9 % IV SOLN: INTRAVENOUS | Status: AC

## 2017-04-20 MED ORDER — SODIUM CHLORIDE 0.9% FLUSH: 3.0000 mL | INTRAVENOUS | Status: DC | PRN

## 2017-04-20 MED ORDER — HEPARIN SODIUM (PORCINE) 1000 UNIT/ML IJ SOLN
INTRAMUSCULAR | Status: DC | PRN
  Administered 2017-04-20: 5000 [IU] via INTRAVENOUS

## 2017-04-20 MED ORDER — IOPAMIDOL (ISOVUE-370) INJECTION 76%
INTRAVENOUS | Status: DC | PRN
  Administered 2017-04-20: 60 mL via INTRAVENOUS

## 2017-04-20 MED ORDER — ASPIRIN 81 MG PO CHEW
CHEWABLE_TABLET | ORAL | Status: AC
  Administered 2017-04-20: 81 mg via ORAL
  Filled 2017-04-20: qty 1

## 2017-04-20 MED ORDER — MIDAZOLAM HCL 2 MG/2ML IJ SOLN
INTRAMUSCULAR | Status: AC
  Filled 2017-04-20: qty 2

## 2017-04-20 MED ORDER — SODIUM CHLORIDE 0.9% FLUSH: 3.0000 mL | Freq: Two times a day (BID) | INTRAVENOUS | Status: DC

## 2017-04-20 MED ORDER — ONDANSETRON HCL 4 MG/2ML IJ SOLN: 4.0000 mg | Freq: Four times a day (QID) | INTRAMUSCULAR | Status: DC | PRN

## 2017-04-20 MED ORDER — ACETAMINOPHEN 325 MG PO TABS: 650.0000 mg | ORAL_TABLET | ORAL | Status: DC | PRN

## 2017-04-20 MED ORDER — HEPARIN (PORCINE) IN NACL 2-0.9 UNIT/ML-% IJ SOLN
INTRAMUSCULAR | Status: AC
  Filled 2017-04-20: qty 1000

## 2017-04-20 MED ORDER — LIDOCAINE HCL (PF) 1 % IJ SOLN
INTRAMUSCULAR | Status: DC | PRN
  Administered 2017-04-20: 3 mL
  Administered 2017-04-20: 1 mL

## 2017-04-20 MED ORDER — FENTANYL CITRATE (PF) 100 MCG/2ML IJ SOLN
INTRAMUSCULAR | Status: AC
  Filled 2017-04-20: qty 2

## 2017-04-20 MED ORDER — VERAPAMIL HCL 2.5 MG/ML IV SOLN
INTRAVENOUS | Status: AC
  Filled 2017-04-20: qty 2

## 2017-04-20 SURGICAL SUPPLY — 15 items
CATH BALLN WEDGE 5F 110CM (CATHETERS) ×1 IMPLANT
CATH INFINITI 5 FR 3DRC (CATHETERS) ×1 IMPLANT
CATH INFINITI 5 FR JL3.5 (CATHETERS) ×1 IMPLANT
CATH INFINITI 5FR JL4 (CATHETERS) ×1 IMPLANT
CATH INFINITI JR4 5F (CATHETERS) ×1 IMPLANT
DEVICE RAD COMP TR BAND LRG (VASCULAR PRODUCTS) ×1 IMPLANT
GLIDESHEATH SLEND SS 6F .021 (SHEATH) ×1 IMPLANT
GUIDEWIRE INQWIRE 1.5J.035X260 (WIRE) IMPLANT
INQWIRE 1.5J .035X260CM (WIRE) ×2
KIT HEART LEFT (KITS) ×2 IMPLANT
PACK CARDIAC CATHETERIZATION (CUSTOM PROCEDURE TRAY) ×2 IMPLANT
SHEATH GLIDE SLENDER 4/5FR (SHEATH) ×1 IMPLANT
TRANSDUCER W/STOPCOCK (MISCELLANEOUS) ×2 IMPLANT
TUBING CIL FLEX 10 FLL-RA (TUBING) ×2 IMPLANT
WIRE PT2 MS 185 (WIRE) ×1 IMPLANT

## 2017-04-20 NOTE — Interval H&P Note (Signed)
History and Physical Interval Note:  04/20/2017 8:57 AM  [name] [name]  has presented today for surgery, with the diagnosis of arotic insufficiency  The various methods of treatment have been discussed with the patient and family. After consideration of risks, benefits and other options for treatment, the patient has consented to  Procedure(s): RIGHT/LEFT HEART CATH AND CORONARY ANGIOGRAPHY (N/A) as a surgical intervention .  The patient's history has been reviewed, patient examined, no change in status, stable for surgery.  I have reviewed the patient's chart and labs.  Questions were answered to the patient's satisfaction.     Dicky Boer, Quillian Quince

## 2017-04-20 NOTE — Discharge Instructions (Signed)

## 2017-04-20 NOTE — Progress Notes (Signed)
Site area: right brachial  Site Prior to Removal:  0  Pressure Applied For 10 MINUTES    Minutes Beginning at 1010  Manual:  yes  Patient Status During Pull:  good  Post Pull site good  Post Pull Instructions Given:  yes  Post Pull Pulses Present:  yes  Dressing Applied yes  Comments:  Tolerated procedure well

## 2017-04-20 NOTE — H&P (View-Only) (Signed)
Advanced Heart Failure Clinic Note   Patient ID: [name] [name], male   DOB: 23-Jan-1981, 36 y.o.   MRN: [id number]  Primary PCP: [facility]  Primary Cardiologist: Dr. Haroldine Laws  HPI: [name] [name] is a 36 y.o.male with systolic HF due to  NICM, hypertension and CKD stage II-III (baseline Cr 1.5-1.7).   He was first diagnosed with HF in 2012 and his EF recovered and was instructed to stop taking carvedilol, spironolactone, and lasix by previous cardiologist. Admitted to [facility] 3/76/28 for acute systolic heart failure. 12/31/11 ProBNP 3777. HIV nonreactive. Thyroid panel normal. Renal ultrasound was negative for obstruction. EF 20%. Discharge weight 193 lbs. Cath with normal coronaries.  Admitted 05/30/2015 with HTN urgency with troponin elevation likely consistent with myocardial strain/demand ischemia in the setting of medication non compliance, having run out 2-3 weeks ago and not refilling them. Placed back on his coreg, hydralazine, and spiro and was symptomatically stable on discharge. No ACE/ARB with CKD and does not tolerate nitrates with headaches. Discharge weight 193 lbs.  Today he returns for HF follow up. Denies SOB/PND/Orthopnea. No lower extremity edema or ab bloating. BP at ~130 at home. Has been off hydralazine for about 1 month. Not exercising. Currently not working.   ECHO 06/13/13: EF 20-25% ECHO 09/26/2014: EF 40%  ECHO 05/31/15 EF 20-25% Echo 11/17 EF 45-50% ECHO 03/2017 EF ~45% Severe AI Personally reviewed.    Review of systems complete and found to be negative unless listed in HPI.    Past Medical History:  Diagnosis Date  . Chronic systolic heart failure ([place])   . CKD (chronic kidney disease) stage 2, GFR 60-89 ml/min   . Essential hypertension, benign   . History of pneumonia   . Mitral regurgitation    Moderate  . Noncompliance   . Nonischemic cardiomyopathy ([place])    Normal coronaries May 2012, LVEF < 20%    Current Outpatient Prescriptions    Medication Sig Dispense Refill  . acetaminophen (TYLENOL) 500 MG tablet Take 500 mg by mouth every 6 (six) hours as needed.    . carvedilol (COREG) 25 MG tablet Take 1 tablet (25 mg total) by mouth 2 (two) times daily with a meal. 60 tablet 6  . furosemide (LASIX) 40 MG tablet Take 2 tablets (80 mg total) by mouth daily. 60 tablet 2  . sacubitril-valsartan (ENTRESTO) 97-103 MG Take 1 tablet by mouth 2 (two) times daily. 60 tablet 3   No current facility-administered medications for this encounter.     Vitals:   04/01/17 1135  BP: 134/82  Pulse: 79  SpO2: 96%  Weight: 231 lb 12 oz (105.1 kg)   Wt Readings from Last 3 Encounters:  04/01/17 231 lb 12 oz (105.1 kg)  12/17/16 210 lb (95.3 kg)  12/04/16 215 lb 12.8 oz (97.9 kg)    PHYSICAL EXAM: General:  Well appearing. No resp difficulty HEENT: normal Neck: supple. no JVD. Carotids 2+ bilat; no bruits. No lymphadenopathy or thryomegaly appreciated. Cor: PMI nondisplaced. Regular rate & rhythm. 2/6 AI  Lungs: clear Abdomen: soft, nontender, nondistended. No hepatosplenomegaly. No bruits or masses. Good bowel sounds. Extremities: no cyanosis, clubbing, rash, edema Neuro: alert & orientedx3, cranial nerves grossly intact. moves all 4 extremities w/o difficulty. Affect pleasant   ASSESSMENT & PLAN:  1) Chronic systolic HF: NICM,  ECHO ~31% in 2013. EF 45-50% in 2016. Today's echo viewed personally in clinic EF 40%. Severe LVH. Moderate AI - Has been out of hydralazine for 1 month. But  BP not too bad  - Remains NYHA I-II Volume status stable. - Continue lasix at current dose.  - On goal dose carvedilol and entresto.  - Add 25 mg hydralazine three times a day (had HAs with Imdur in past) - With severe LVH will arrange for PYP scan (no MRI with Cr 1.7) - Check TEE for AI - Reinforced fluid restriction to < 2 L daily, sodium restriction to less than 2000 mg daily, and the importance of daily weights.   2) HTN :  - Stable. Add back  hydralazine today 3) CKD stage III:  (baseline Cr 1.5-1.7) -  4) Snoring: Has mild polycythemia. Likely has OSA.  - Set up sleep study. Marland Kitchen  5) Moderate AI on echo - On ECHO looks worse today. Set up TEE .  Glori Bickers, MD  12:02 PM   Patient seen and examined with Darrick Grinder, NP. We discussed all aspects of the encounter. I agree with the assessment and plan as stated above.   Overall remains stable from functional standpoint NYHA I-II. BP fairly well controlled despite being out of hydralazine.   Echo reviewed personally. EF ~40% has severe LVH and worsening AI. Will get TEE to evaluate AI better. Suspect LVH related to HTN but will check PYP scan to exclude TTR amyloid.   Needs sleeps study for OSA.  Encouraged diet and exercise for weight loss.   Total time spent 40 minutes. Over half that time spent discussing above.   Glori Bickers, MD  12:27 PM

## 2017-04-21 MED FILL — Lidocaine HCl Local Inj 2%: INTRAMUSCULAR | Qty: 10 | Status: AC

## 2017-04-22 ENCOUNTER — Encounter: Payer: BLUE CROSS/BLUE SHIELD | Admitting: Thoracic Surgery (Cardiothoracic Vascular Surgery)

## 2017-04-23 ENCOUNTER — Institutional Professional Consult (permissible substitution) (INDEPENDENT_AMBULATORY_CARE_PROVIDER_SITE_OTHER): Payer: BLUE CROSS/BLUE SHIELD | Admitting: Thoracic Surgery (Cardiothoracic Vascular Surgery)

## 2017-04-23 ENCOUNTER — Encounter: Payer: Self-pay | Admitting: Thoracic Surgery (Cardiothoracic Vascular Surgery)

## 2017-04-23 VITALS — BP 138/84 | HR 74 | Ht 70.0 in | Wt 229.0 lb

## 2017-04-23 DIAGNOSIS — I351 Nonrheumatic aortic (valve) insufficiency: Secondary | ICD-10-CM | POA: Diagnosis not present

## 2017-04-23 NOTE — Progress Notes (Signed)
[address]       [place] [address]             [facility] REPORT  Referring Provider is Bensimhon, Shaune Pascal, MD PCP is Sharilyn Sites, MD  Chief Complaint  Patient presents with  . New Patient (Initial Visit)    Aortic valve repair    HPI:  Patient is a 36 year old African-American male with history of aortic insufficiency, nonischemic cardiomyopathy with chronic combined systolic and diastolic congestive heart failure, long-standing hypertension with hypertensive heart disease, end-stage 3 chronic kidney disease who has been referred for surgical consultation to discuss treatment options for management of stage D severe symptomatic aortic insufficiency. Patient states that he was first diagnosed with congestive heart failure in 2012.  He was diagnosed with nonischemic cardiomyopathy and hypertension. Left ventricular ejection fraction reportedly improved and heart failure medications were ultimately stopped. The following year he presented with a recurrence of acute systolic congestive heart failure. Ejection fraction had dropped again to 20%. He improved with medical therapy. In 2016 he was hospitalized a third time with hypertensive urgency, elevated troponin levels and congestive heart failure after the patient had run out of his medications. He has been followed for the last several years by Dr. Haroldine Laws in the advanced heart failure clinic. He has remained stable from a medical standpoint with mild symptoms of exertional shortness of breath. However, every time he cut back on diuretics or other medications he develops fairly rapid onset symptoms of exertional shortness of breath and fluid retention.  He was seen in follow-up recently by Dr. Haroldine Laws and repeat echocardiogram revealed ejection fraction 40% with severe left ventricular hypertrophy and at least moderate aortic insufficiency with mild to moderate left  ventricular chamber enlargement. Transesophageal echocardiogram was performed to more carefully evaluate the severity of aortic insufficiency on 04/09/2017. This revealed severe aortic insufficiency. The aortic valve was trileaflet but there was incomplete coaptation of the right coronary noncoronary leaflets. There was no evidence of vegetation no other signs to suggest a history of endocarditis. There was diffuse left ventricular hypokinesis with ejection fraction estimated 35%.  Left and right heart catheterization was performed 04/20/2017. The patient was noted to have normal coronary arteries with no significant coronary artery disease. There was severe aortic insufficiency and compensated filling pressures with PA pressures measured 31/17. Cardiac output was 4.8 L/m corresponding to a cardiac index of 2.2. Mixed venous oxygen saturation 69% and central venous pressure 4 mmHg. The patient was referred for possible elective aortic valve repair.  The patient just recently got married and lives in [place] with his wife. His father lives close by and accompanied the patient to his office visit today. The patient works full-time for [employer] in [place], [place]. His job requires some strenuous physical activity although he is currently on light duty because of a fracture in his right wrist.  He states that recently he has been fairly stable from a cardiac standpoint. He gets short of breath with more strenuous physical exertion but this does not limit him to much. He does not exercise on a regular basis. He has had some occasional vague tightness across his chest both with and without physical activity. He reports occasional palpitations. If he cuts back or stop taking diuretics he gets short of breath within a few days. He monitors his weight carefully and has been compliant with medical therapy for a long  time now.  Past Medical History:  Diagnosis Date  . CHF (congestive heart failure)  ([place])   . Chronic systolic heart failure ([place])   . CKD (chronic kidney disease) stage 2, GFR 60-89 ml/min   . Essential hypertension, benign   . Headache   . History of pneumonia   . Mitral regurgitation    Moderate  . Noncompliance   . Nonischemic cardiomyopathy ([place])    Normal coronaries May 2012, LVEF < 20%    Past Surgical History:  Procedure Laterality Date  . RIGHT/LEFT HEART CATH AND CORONARY ANGIOGRAPHY N/A 04/20/2017   Procedure: RIGHT/LEFT HEART CATH AND CORONARY ANGIOGRAPHY;  Surgeon: Jolaine Artist, MD;  Location: [facility];  Service: Cardiovascular;  Laterality: N/A;  . SURGERY SCROTAL / TESTICULAR     Testicular torsion  . TEE WITHOUT CARDIOVERSION N/A 04/09/2017   Procedure: TRANSESOPHAGEAL ECHOCARDIOGRAM (TEE);  Surgeon: Jolaine Artist, MD;  Location: [facility];  Service: Cardiovascular;  Laterality: N/A;    Family History  Problem Relation Age of Onset  . Breast cancer Mother   . Stroke Father     Social History   Social History  . Marital status: Married    Spouse name: N/A  . Number of children: 0  . Years of education: N/A   Occupational History  . Not on file.   Social History Main Topics  . Smoking status: Never Smoker  . Smokeless tobacco: Never Used  . Alcohol use 0.0 oz/week     Comment: Occasionally  . Drug use: No  . Sexual activity: Yes    Birth control/ protection: Condom   Other Topics Concern  . Not on file   Social History Narrative   Occupation: just started sanitation job cleaning    Current Outpatient Prescriptions  Medication Sig Dispense Refill  . acetaminophen (TYLENOL) 500 MG tablet Take 500 mg by mouth every 6 (six) hours as needed.    . carvedilol (COREG) 25 MG tablet Take 1 tablet (25 mg total) by mouth 2 (two) times daily with a meal. 60 tablet 6  . furosemide (LASIX) 40 MG tablet Take 2 tablets (80 mg total) by mouth daily. (Patient taking differently: Take 40 mg by mouth 2 (two) times daily. )  60 tablet 2  . hydrALAZINE (APRESOLINE) 25 MG tablet Take 1 tablet (25 mg total) by mouth 3 (three) times daily. 90 tablet 3  . sacubitril-valsartan (ENTRESTO) 97-103 MG Take 1 tablet by mouth 2 (two) times daily. 60 tablet 3   No current facility-administered medications for this visit.     No Known Allergies    Review of Systems:   General:  normal appetite, stable energy, + weight gain, no weight loss, no fever  Cardiac:  no chest pain with exertion, no chest pain at rest, + SOB with exertion, no resting SOB, no PND, no orthopnea, + palpitations, no arrhythmia, no atrial fibrillation, no LE edema, no dizzy spells, no syncope  Respiratory:  + exertional shortness of breath, no home oxygen, no productive cough, no dry cough, no bronchitis, no wheezing, no hemoptysis, no asthma, no pain with inspiration or cough, no sleep apnea, no CPAP at night  GI:   no difficulty swallowing, no reflux, no frequent heartburn, no hiatal hernia, no abdominal pain, no constipation, no diarrhea, no hematochezia, no hematemesis, no melena  GU:   no dysuria,  no frequency, no urinary tract infection, no hematuria, no enlarged prostate, no kidney stones, + chronic kidney disease  Vascular:  no pain suggestive of claudication, no pain in feet, no leg cramps, no varicose veins, no DVT, no non-healing foot ulcer  Neuro:   no stroke, no TIA's, no seizures, no headaches, no temporary blindness one eye,  no slurred speech, no peripheral neuropathy, no chronic pain, no instability of gait, no memory/cognitive dysfunction  Musculoskeletal: no arthritis, + joint swelling right wrist, no myalgias, no difficulty walking, normal mobility   Skin:   no rash, no itching, no skin infections, no pressure sores or ulcerations  Psych:   no anxiety, no depression, no nervousness, no unusual recent stress  Eyes:   no blurry vision, no floaters, no recent vision changes, no wears glasses or contacts  ENT:   no hearing loss, no loose or  painful teeth, no dentures, last saw dentist June 2018  Hematologic:  no easy bruising, no abnormal bleeding, no clotting disorder, no frequent epistaxis  Endocrine:  no diabetes, does not check CBG's at home     Physical Exam:   BP 138/84   Pulse 74   Ht 5\' 10"  (1.778 m)   Wt 229 lb (103.9 kg)   SpO2 97%   BMI 32.86 kg/m   General:  Moderately obese AA male,  well-appearing  HEENT:  Unremarkable   Neck:   no JVD, no bruits, no adenopathy   Chest:   clear to auscultation, symmetrical breath sounds, no wheezes, no rhonchi   CV:   RRR, grade III/VI holodiastolic murmur best along LLSB  Abdomen:  soft, non-tender, no masses   Extremities:  warm, well-perfused, pulses bounding, no LE edema  Rectal/GU  Deferred  Neuro:   Grossly non-focal and symmetrical throughout  Skin:   Clean and dry, no rashes, no breakdown   Diagnostic Tests:  Transthoracic Echocardiography  (Report amended )  Patient:    Jovaughn, Wojtaszek MR #:       [id number] Study Date: 04/01/2017 Gender:     M Age:        35 Height:     177.8 cm Weight:     95.3 kg BSA:        2.19 m^2 Pt. Status: Room:   ATTENDING    Default, Provider [phone]  Normajean Baxter, [place]  ORDERING     Bensimhon, Daniel  REFERRING    Bensimhon, [place], Inpatient  SONOGRAPHER  Cardell Peach, RDCS  cc:  ------------------------------------------------------------------- LV EF: 45% -   50%  ------------------------------------------------------------------- Indications:      CHF - 428.0.  ------------------------------------------------------------------- Study Conclusions  - Left ventricle: The cavity size was mildly dilated. There was   moderate concentric hypertrophy. Systolic function was mildly   reduced. The estimated ejection fraction was in the range of 45%   to 50%. Wall motion was normal; there were no regional wall   motion abnormalities. Features are consistent with a  pseudonormal   left ventricular filling pattern, with concomitant abnormal   relaxation and increased filling pressure (grade 2 diastolic   dysfunction). Doppler parameters are consistent with elevated   ventricular end-diastolic filling pressure. - Aortic valve: There was severe regurgitation. - Mitral valve: There was mild regurgitation. - Left atrium: The atrium was mildly dilated. - Right ventricle: The cavity size was normal. Wall thickness was   normal. Systolic function was normal. - Right atrium: The atrium was normal in size. - Tricuspid valve: There was trivial regurgitation. - Pulmonic valve: There was mild regurgitation. - Pulmonary  arteries: Systolic pressure was within the normal   range. - Inferior vena cava: The vessel was normal in size. - Pericardium, extracardiac: There was no pericardial effusion.  Impressions:  - Mild left ventricular dilatation with mild systolic dysfunction   LVEF 45-50%. There is mild diffuse hypokinesis and basal and mid   inferoseptal and inferior walls.   Aortic regurgitation is now severe.  ------------------------------------------------------------------- Study data:  Comparison was made to the study of 05/22/2016.  Study status:  Routine.  Procedure:  Transthoracic echocardiography. Image quality was adequate.  Study completion:  There were no complications.          Transthoracic echocardiography.  M-mode, complete 2D, spectral Doppler, and color Doppler.  Birthdate: Patient birthdate: 02/23/1981.  Age:  Patient is 36 yr old.  Sex: Gender: male.    BMI: 30.1 kg/m^2.  Blood pressure:     126/76 Patient status:  Outpatient.  Study date:  Study date: 04/01/2017. Study time: 11:09 AM.  Location:  Bedside.  -------------------------------------------------------------------  ------------------------------------------------------------------- Left ventricle:  The cavity size was mildly dilated. There was moderate concentric  hypertrophy. Systolic function was mildly reduced. The estimated ejection fraction was in the range of 45% to 50%. Wall motion was normal; there were no regional wall motion abnormalities. Features are consistent with a pseudonormal left ventricular filling pattern, with concomitant abnormal relaxation and increased filling pressure (grade 2 diastolic dysfunction). Doppler parameters are consistent with elevated ventricular end-diastolic filling pressure.  ------------------------------------------------------------------- Aortic valve:   Trileaflet; normal thickness leaflets. Mobility was not restricted.  Doppler:  Transvalvular velocity was within the normal range. There was no stenosis. There was severe regurgitation.  ------------------------------------------------------------------- Aorta:  Aortic root: The aortic root was normal in size.  ------------------------------------------------------------------- Mitral valve:   Structurally normal valve.   Mobility was not restricted.  Doppler:  Transvalvular velocity was within the normal range. There was no evidence for stenosis. There was mild regurgitation.    Peak gradient (D): 2 mm Hg.  ------------------------------------------------------------------- Left atrium:  The atrium was mildly dilated.  ------------------------------------------------------------------- Right ventricle:  The cavity size was normal. Wall thickness was normal. Systolic function was normal.  ------------------------------------------------------------------- Pulmonic valve:    Structurally normal valve.   Cusp separation was normal.  Doppler:  Transvalvular velocity was within the normal range. There was no evidence for stenosis. There was mild regurgitation.  ------------------------------------------------------------------- Tricuspid valve:   Structurally normal valve.    Doppler: Transvalvular velocity was within the normal range.  There was trivial regurgitation.  ------------------------------------------------------------------- Pulmonary artery:   The main pulmonary artery was normal-sized. Systolic pressure was within the normal range.  ------------------------------------------------------------------- Right atrium:  The atrium was normal in size.  ------------------------------------------------------------------- Pericardium:  There was no pericardial effusion.  ------------------------------------------------------------------- Systemic veins: Inferior vena cava: The vessel was normal in size.  ------------------------------------------------------------------- Measurements   Left ventricle                          Value        Reference  LV ID, ED, PLAX chordal          (H)    64.7  mm     43 - 52  LV ID, ES, PLAX chordal          (H)    44.3  mm     23 - 38  LV fx shortening, PLAX chordal          32    %      >=  29  LV PW thickness, ED                     12.7  mm     ----------  IVS/LV PW ratio, ED                     1            <=1.3  Stroke volume, 2D                       51    ml     ----------  Stroke volume/bsa, 2D                   23    ml/m^2 ----------  LV e&', lateral                          4.03  cm/s   ----------  LV E/e&', lateral                        18.09        ----------  LV e&', medial                           4.68  cm/s   ----------  LV E/e&', medial                         15.58        ----------  LV e&', average                          4.36  cm/s   ----------  LV E/e&', average                        16.74        ----------  Longitudinal strain, TDI                12    %      ----------    Ventricular septum                      Value        Reference  IVS thickness, ED                       12.7  mm     ----------    LVOT                                    Value        Reference  LVOT ID, S                              21    mm     ----------  LVOT area                                3.46  cm^2   ----------  LVOT peak velocity, S  87.7  cm/s   ----------  LVOT mean velocity, S                   60    cm/s   ----------  LVOT VTI, S                             14.8  cm     ----------  LVOT peak gradient, S                   3     mm Hg  ----------    Aortic valve                            Value        Reference  Aortic regurg pressure half-time        391   ms     ----------    Aorta                                   Value        Reference  Aortic root ID, ED                      34    mm     ----------    Left atrium                             Value        Reference  LA ID, A-P, ES                          43    mm     ----------  LA ID/bsa, A-P                          1.96  cm/m^2 <=2.2  LA volume, S                            70.5  ml     ----------  LA volume/bsa, S                        32.1  ml/m^2 ----------  LA volume, ES, 1-p A4C                  59.2  ml     ----------  LA volume/bsa, ES, 1-p A4C              27    ml/m^2 ----------  LA volume, ES, 1-p A2C                  79.5  ml     ----------  LA volume/bsa, ES, 1-p A2C              36.2  ml/m^2 ----------    Mitral valve                            Value        Reference  Mitral E-wave peak velocity  72.9  cm/s   ----------  Mitral A-wave peak velocity             54.5  cm/s   ----------  Mitral deceleration time                176   ms     150 - 230  Mitral peak gradient, D                 2     mm Hg  ----------  Mitral E/A ratio, peak                  1.3          ----------    Right atrium                            Value        Reference  RA ID, S-I, ES, A4C              (H)    63.6  mm     34 - 49  RA area, ES, A4C                        17    cm^2   8.3 - 19.5  RA volume, ES, A/L                      38.9  ml     ----------  RA volume/bsa, ES, A/L                  17.7  ml/m^2 ----------    Systemic veins                           Value        Reference  Estimated CVP                           3     mm Hg  ----------    Right ventricle                         Value        Reference  RV ID, minor axis, ED, A4C base         35.7  mm     ----------  TAPSE                                   19.1  mm     ----------  RV s&', lateral, S                       13.8  cm/s   ----------    Pulmonic valve                          Value        Reference  Pulmonic regurg velocity, ED            117   cm/s   ----------  Pulmonic regurg gradient, ED            5     mm Hg  ----------  Legend: (  L)  and  (H)  mark values outside specified reference range.  ------------------------------------------------------------------- Lance Morin, M.D. 2018-09-20T14:06:34   Transesophageal Echocardiography  Patient:    Viviano, Bir MR #:       [id number] Study Date: 04/09/2017 Gender:     M Age:        35 Height:     177.8 cm Weight:     103.9 kg BSA:        2.3 m^2 Pt. Status: Room:   ATTENDING    Pierre Bali, MD  PERFORMING   Pierre Bali, MD  SONOGRAPHER  Johny Chess, RDCS, CCT  ADMITTING    Bensimhon, Lanae Crumbly     Bensimhon, Daniel  cc:  ------------------------------------------------------------------- LV EF: 35%  ------------------------------------------------------------------- Indications:      Aortic insufficiency 424.1.  ------------------------------------------------------------------- Study Conclusions  - Left ventricle: The estimated ejection fraction was 35%. Diffuse   hypokinesis. - Aortic valve: There is incomplete coaptation of the RCC and NCC   with severe AI. No evidence of valve destrcution or other   abnormality. - Mitral valve: No evidence of vegetation. - Left atrium: The atrium was dilated. - Right atrium: No evidence of thrombus in the atrial cavity or   appendage. - Tricuspid valve: No evidence of vegetation. - Pulmonic valve: No evidence  of vegetation.  ------------------------------------------------------------------- Study data:   Study status:  Routine.  Consent:  The risks, benefits, and alternatives to the procedure were explained to the patient and informed consent was obtained.  Procedure:  Initial setup. The patient was brought to the laboratory. Surface ECG leads were monitored. Sedation. Conscious sedation was administered by cardiology staff. Transesophageal echocardiography. An adult multiplane transesophageal probe was inserted by the attending cardiologistwithout difficulty. Image quality was adequate.  Study completion:  The patient tolerated the procedure well. There were no complications.  Administered medications:   Midazolam, 5mg , IV. Fentanyl, 71mcg, IV.          Diagnostic transesophageal echocardiography.  2D and color Doppler.  Birthdate:  Patient birthdate: 1980/09/03.  Age:  Patient is 36 yr old.  Sex:  Gender: male.    BMI: 32.9 kg/m^2.  Blood pressure:     164/83  Patient status:  Outpatient.  Study date:  Study date: 04/09/2017. Study time: 11:14 AM.  Location:  Endoscopy.  -------------------------------------------------------------------  ------------------------------------------------------------------- Left ventricle:  The estimated ejection fraction was 35%. Diffuse hypokinesis.  ------------------------------------------------------------------- Aortic valve:   Trileaflet. There is incomplete coaptation of the RCC and NCC with severe AI. No evidence of valve destrcution or other abnormality.  Doppler:   There was no stenosis.  ------------------------------------------------------------------- Aorta:  The aorta was normal, not dilated, and non-diseased.  ------------------------------------------------------------------- Mitral valve:   Structurally normal valve.   Leaflet separation was normal.  No evidence of vegetation.  Doppler:  There was  no regurgitation.  ------------------------------------------------------------------- Left atrium:  The atrium was dilated.  ------------------------------------------------------------------- Right ventricle:  The cavity size was normal. Wall thickness was normal. Systolic function was normal.  ------------------------------------------------------------------- Pulmonic valve:    Structurally normal valve.   Cusp separation was normal.  No evidence of vegetation.  ------------------------------------------------------------------- Tricuspid valve:   Structurally normal valve.   Leaflet separation was normal.  No evidence of vegetation.  Doppler:  There was trivial regurgitation.  ------------------------------------------------------------------- Right atrium:  The atrium was normal in size.  No evidence of thrombus in the atrial cavity or appendage.  ------------------------------------------------------------------- Pericardium:  There was no pericardial effusion.   ------------------------------------------------------------------- Post  procedure conclusions Ascending Aorta:  - The aorta was normal, not dilated, and non-diseased.  ------------------------------------------------------------------- Measurements   Aortic valve                          Value  Aortic regurg pressure half-time      171   ms  Legend: (L)  and  (H)  mark values outside specified reference range.  ------------------------------------------------------------------- Prepared and Electronically Authenticated by  Pierre Bali, MD 2018-10-01T13:57:11    RIGHT/LEFT HEART CATH AND CORONARY ANGIOGRAPHY  Conclusion   Findings:  Ao = 125/75 (97) LV = 96/10 RA = 4 RV = 32/7 PA = 31/17 (23) PCW = 16 Fick cardiac output/index = 4.8/2.2 PVR = 1.5 WU Ao sat = 99% PA sat = 69%, 71%  Assessment: 1. Normal coronaries with left dominant system 2. Severe AI and dilated  aortic root by echo 3. Well compensated filling pressures  Plan/Discussion:  For possible aortic valve repair with Dr. Roxy Manns.   Bensimhon, Quillian Quince, MD  9:47 AM    Indications   Chronic systolic heart failure ([place]) [I50.22 (ICD-10-CM)]  Nonrheumatic aortic valve insufficiency [I35.1 (ICD-10-CM)]  Procedural Details/Technique   Technical Details The risks and indication of the procedure were explained. Consent was signed and placed on the chart. An appropriate timeout was taken prior to the procedure.   The right AC fossa was prepped and draped in the routine sterile fashion and anesthetized with 1% local lidocaine. The pre-existing PIV in the right [facility] was exchanged over a wire for a 5 FR venous sheath using a modified Seldinger technique. A standard Swan-Ganz catheter was used for the procedure.   After a normal Allen's test was confirmed, the right wrist was prepped and draped in the routine sterile fashion and anesthetized with 1% local lidocaine. A 5 FR arterial sheath was then placed in the right radial artery using a modified Seldinger technique. 3mg  IV verapamil was given through the sheath. Systemic heparin was administered. Standard catheters including a JL 4 and a JR 4 were used. All catheter exchanges were made over a wire.   Estimated blood loss <50 mL.  During this procedure the patient was administered the following to achieve and maintain moderate conscious sedation: Versed 1 mg, Fentanyl 25 mcg, while the patient's heart rate, blood pressure, and oxygen saturation were continuously monitored. The period of conscious sedation was 36 minutes, of which I was present face-to-face 100% of this time.    Coronary Findings   Dominance: Left  Left Main  Vessel is angiographically normal.  Left Anterior Descending  Vessel is angiographically normal.  Left Circumflex  Vessel is angiographically normal.  Right Coronary Artery  Vessel is angiographically normal.  Coronary  Diagrams   Diagnostic Diagram       Implants     No implant documentation for this case.  MERGE Images   Show images for CARDIAC CATHETERIZATION   Link to Procedure Log   Procedure Log    Hemo Data    Most Recent Value  Fick Cardiac Output 4.78 L/min  Fick Cardiac Output Index 2.16 (L/min)/BSA  RA A Wave 5 mmHg  RA V Wave 4 mmHg  RA Mean 4 mmHg  RV Systolic Pressure 32 mmHg  RV Diastolic Pressure 4 mmHg  RV EDP 7 mmHg  PA Systolic Pressure 31 mmHg  PA Diastolic Pressure 17 mmHg  PA Mean 23 mmHg  PW A Wave 18 mmHg  PW V Wave  17 mmHg  PW Mean 16 mmHg  AO Systolic Pressure 102 mmHg  AO Diastolic Pressure 75 mmHg  AO Mean 97 mmHg  LV Systolic Pressure 96 mmHg  LV Diastolic Pressure 5 mmHg  LV EDP 10 mmHg  Arterial Occlusion Pressure Extended Systolic Pressure 92 mmHg  Arterial Occlusion Pressure Extended Diastolic Pressure 56 mmHg  Arterial Occlusion Pressure Extended Mean Pressure 72 mmHg  Left Ventricular Apex Extended Systolic Pressure 90 mmHg  Left Ventricular Apex Extended Diastolic Pressure 8 mmHg  Left Ventricular Apex Extended EDP Pressure 12 mmHg  QP/QS 1  TPVR Index 10.64 HRUI  TSVR Index 44.88 HRUI  PVR SVR Ratio 0.08  TPVR/TSVR Ratio 0.24     Impression:  Patient has stage D severe symptomatic primary aortic insufficiency with nonischemic cardiomyopathy and chronic combined systolic and diastolic congestive heart failure.  At present he describes stable symptoms of exertional shortness of breath and fatigue that occur only with more strenuous physical exertion. Symptoms get worse quickly if the patient stops taking diuretics for even a relatively brief period time. I have personally reviewed the patient's recent transthoracic and transesophageal echocardiograms and diagnostic cardiac catheterization. Patient has at least moderate left ventricular systolic dysfunction and moderate diastolic dysfunction. There is moderate left ventricular chamber  enlargement. The aortic valve is trileaflet. There appears to be some prolapse of the noncoronary leaflet with severe aortic insufficiency. The aortic valve leaflets are thin and there are no signs of perforation or other abnormalities to suggest a previous history of endocarditis. I agree that surgical intervention is indicated and the patient appears to be good candidate for aortic valve repair.   Plan:  The patient and his father were counseled at length regarding treatment alternatives for management of severe aortic insufficiency including continued medical therapy versus proceeding with aortic valve repair or replacement in the near future.  The natural history of aortic insufficiency was reviewed, as was long term prognosis with medical therapy alone.  Surgical options were discussed at length including conventional surgical aortic valve repair or replacement through either a full median sternotomy or using minimally invasive techniques.  The many potential benefits of valve repair were discussed. Concerns regarding long-term durability following valve repair were reviewed, as were the potential improvements associated with recently developed prosthetic ring designed for use in patients with aortic insufficiency (the HAART Biostable ring annuloplasty system). Other alternatives including the Ross autograft procedure and homograft aortic root replacement were discussed.  In the event that the patient's valve cannot be successfully repaired, discussion was held comparing the relative risks of mechanical valve replacement with need for lifelong anticoagulation versus use of a bioprosthetic tissue valve and the associated potential for late structural valve deterioration and failure.   The patient understands and accepts all potential associated risks of surgery including but not limited to risk of death, stroke, myocardial infarction, congestive heart failure, respiratory failure, renal failure, pneumonia,  bleeding requiring blood transfusion and or reexploration, arrhythmia, heart block or bradycardia requiring permanent pacemaker, aortic dissection or other major vascular complication, pleural effusions or other delayed complications related to continued congestive heart failure, and other late complications related to valve repair or replacement including structural valve deterioration and failure, thrombosis, endocarditis, or paravalvular leak.  We tentatively plan to proceed with surgery on Thursday, 05/13/2017. The patient will return to our office for follow-up on Monday, 05/10/2017 prior to surgery.   I spent in excess of 90 minutes during the conduct of this office consultation and >50% of this  time involved direct face-to-face encounter with the patient for counseling and/or coordination of their care.    Valentina Gu. Roxy Manns, MD 04/23/2017 11:58 AM

## 2017-04-23 NOTE — Patient Instructions (Signed)
Continue all previous medications without any changes at this time  

## 2017-04-25 MED ORDER — DEXTROSE 5 % IV SOLN: 1.5000 g | INTRAVENOUS | Status: DC

## 2017-04-25 MED ORDER — SODIUM CHLORIDE 0.9 % IV SOLN: 30.0000 ug/min | INTRAVENOUS | Status: DC

## 2017-04-25 MED ORDER — TRANEXAMIC ACID 1000 MG/10ML IV SOLN: 1.5000 mg/kg/h | INTRAVENOUS | Status: DC

## 2017-04-25 MED ORDER — DOPAMINE-DEXTROSE 3.2-5 MG/ML-% IV SOLN: 0.0000 ug/kg/min | INTRAVENOUS | Status: DC

## 2017-04-25 MED ORDER — VANCOMYCIN HCL 10 G IV SOLR: 1250.0000 mg | INTRAVENOUS | Status: DC

## 2017-04-25 MED ORDER — EPINEPHRINE PF 1 MG/ML IJ SOLN: 0.0000 ug/min | INTRAVENOUS | Status: DC

## 2017-04-25 MED ORDER — POTASSIUM CHLORIDE 2 MEQ/ML IV SOLN: 80.0000 meq | INTRAVENOUS | Status: DC

## 2017-04-25 MED ORDER — SODIUM CHLORIDE 0.9 % IV SOLN: INTRAVENOUS | Status: DC

## 2017-04-25 MED ORDER — DEXMEDETOMIDINE HCL IN NACL 400 MCG/100ML IV SOLN: 0.1000 ug/kg/h | INTRAVENOUS | Status: DC

## 2017-04-25 MED ORDER — DEXTROSE 5 % IV SOLN: 750.0000 mg | INTRAVENOUS | Status: DC

## 2017-04-25 MED ORDER — PLASMA-LYTE 148 IV SOLN: INTRAVENOUS | Status: DC

## 2017-04-25 MED ORDER — TRANEXAMIC ACID (OHS) BOLUS VIA INFUSION: 15.0000 mg/kg | INTRAVENOUS | Status: DC

## 2017-04-25 MED ORDER — MAGNESIUM SULFATE 50 % IJ SOLN: 40.0000 meq | INTRAMUSCULAR | Status: DC

## 2017-04-25 MED ORDER — TRANEXAMIC ACID (OHS) PUMP PRIME SOLUTION: 2.0000 mg/kg | INTRAVENOUS | Status: DC

## 2017-04-25 MED ORDER — NITROGLYCERIN IN D5W 200-5 MCG/ML-% IV SOLN: 2.0000 ug/min | INTRAVENOUS | Status: DC

## 2017-04-25 MED ORDER — VANCOMYCIN HCL 1000 MG IV SOLR: INTRAVENOUS | Status: DC

## 2017-04-26 ENCOUNTER — Other Ambulatory Visit: Payer: Self-pay

## 2017-04-26 DIAGNOSIS — I351 Nonrheumatic aortic (valve) insufficiency: Secondary | ICD-10-CM

## 2017-04-27 ENCOUNTER — Telehealth: Payer: Self-pay

## 2017-04-27 NOTE — Telephone Encounter (Signed)
Patient notified of surgical schedule and instructions/letter was mailed

## 2017-05-05 ENCOUNTER — Other Ambulatory Visit (HOSPITAL_COMMUNITY): Payer: Self-pay | Admitting: Internal Medicine

## 2017-05-06 NOTE — Pre-Procedure Instructions (Signed)
[name] [name]  05/06/2017     Your procedure is scheduled on Tuesday, October 30.                Report to [facility] Admitting at 5:30 AM                     Your surgery or procedure is scheduled for 7:30 AM   Call this number if you have problems the morning of surgery: [phone]- pre- op desk                For any other questions, please call [phone], Monday - Friday 8 AM - 4 PM.   Remember:  Do not eat food or drink liquids after midnight Monday, October 29.  Take these medicines the morning of surgery with A SIP OF WATER :  carvedilol (COREG) hydrALAZINE (Apresoline)            May take tylenol if needed.            Do not take any Aspirin, Aspirin Products, Ibuprofen, Naproxen (Aleve), Vitamins or Herbal Products.  Special instructions:  [facility]- Preparing For Surgery  Before surgery, you can play an important role. Because skin is not sterile, your skin needs to be as free of germs as possible. You can reduce the number of germs on your skin by washing with CHG (chlorahexidine gluconate) Soap before surgery.  CHG is an antiseptic cleaner which kills germs and bonds with the skin to continue killing germs even after washing.  Please do not use if you have an allergy to CHG or antibacterial soaps. If your skin becomes reddened/irritated stop using the CHG.  Do not shave (including legs and underarms) for at least 48 hours prior to first CHG shower. It is OK to shave your face.  Please follow these instructions carefully.   1. Shower the NIGHT BEFORE SURGERY and the MORNING OF SURGERY with CHG.   2. If you chose to wash your hair, wash your hair first as usual with your normal shampoo.         3. After you shampoo, rinse your hair and body thoroughly to remove the shampoo.      Wash your face and private area with the soap you use at home, then rinse.  4. Use CHG as you would any other liquid soap. You can apply CHG directly to the skin and  wash gently with a scrungie or a clean washcloth.   5. Apply the CHG Soap to your body ONLY FROM THE NECK DOWN.  Do not use on open wounds or open sores. Avoid contact with your eyes, ears, mouth and genitals (private parts). Wash Face and genitals (private parts)  with your normal soap.  6. Wash thoroughly, paying special attention to the area where your surgery will be performed.  7. Thoroughly rinse your body with warm water from the neck down.  8. DO NOT shower/wash with your normal soap after using and rinsing off the CHG Soap.  9. Pat yourself dry with a CLEAN TOWEL.  10. Wear CLEAN PAJAMAS to bed the night before surgery, wear comfortable clothes the morning of surgery  11. Place CLEAN SHEETS on your bed the night of your first shower and DO NOT SLEEP WITH PETS.  Day of Surgery: Shower as Above Do not apply any deodorants/lotions, powders or colognes. Please wear clean clothes to the hospital/surgery center.    Do not  wear jewelry, make-up or nail polish.  Do not shave 48 hours prior to surgery.  Men may shave face and neck.  Do not bring valuables to the hospital.  [facility] is not responsible for any belongings or valuables.  Contacts, dentures or bridgework may not be worn into surgery.  Leave your suitcase in the car.  After surgery it may be brought to your room.  For patients admitted to the hospital, discharge time will be determined by your treatment team.  Patients discharged the day of surgery will not be allowed to drive home.   Please read over the following fact sheets that you were given: Pain Booklet, Patient Instructions for Mupirocin Application, Incentive Spirometry, Surgical Site Infections.

## 2017-05-07 ENCOUNTER — Ambulatory Visit (HOSPITAL_COMMUNITY)
Admission: RE | Admit: 2017-05-07 | Discharge: 2017-05-07 | Disposition: A | Discharge status: Home / Self Care | Payer: BLUE CROSS/BLUE SHIELD | Source: Ambulatory Visit | Attending: Thoracic Surgery (Cardiothoracic Vascular Surgery) | Admitting: Thoracic Surgery (Cardiothoracic Vascular Surgery)

## 2017-05-07 ENCOUNTER — Encounter (HOSPITAL_COMMUNITY)
Admission: RE | Admit: 2017-05-07 | Discharge: 2017-05-07 | Disposition: A | Discharge status: Home / Self Care | Payer: BLUE CROSS/BLUE SHIELD | Source: Ambulatory Visit | Attending: Thoracic Surgery (Cardiothoracic Vascular Surgery) | Admitting: Thoracic Surgery (Cardiothoracic Vascular Surgery)

## 2017-05-07 ENCOUNTER — Encounter (HOSPITAL_COMMUNITY): Payer: Self-pay

## 2017-05-07 DIAGNOSIS — I351 Nonrheumatic aortic (valve) insufficiency: Secondary | ICD-10-CM

## 2017-05-07 DIAGNOSIS — Z01810 Encounter for preprocedural cardiovascular examination: Secondary | ICD-10-CM | POA: Diagnosis not present

## 2017-05-07 DIAGNOSIS — Z01812 Encounter for preprocedural laboratory examination: Secondary | ICD-10-CM | POA: Diagnosis not present

## 2017-05-07 DIAGNOSIS — I517 Cardiomegaly: Secondary | ICD-10-CM | POA: Insufficient documentation

## 2017-05-07 HISTORY — DX: Dyspnea, unspecified: R06.00

## 2017-05-07 HISTORY — DX: Anxiety disorder, unspecified: F41.9

## 2017-05-07 LAB — URINALYSIS, ROUTINE W REFLEX MICROSCOPIC
BACTERIA UA: NONE SEEN
Bilirubin Urine: NEGATIVE
Glucose, UA: NEGATIVE mg/dL
KETONES UR: NEGATIVE mg/dL
Leukocytes, UA: NEGATIVE
Nitrite: NEGATIVE
PROTEIN: 100 mg/dL — AB
SQUAMOUS EPITHELIAL / LPF: NONE SEEN
Specific Gravity, Urine: 1.017 (ref 1.005–1.030)
pH: 5 (ref 5.0–8.0)

## 2017-05-07 LAB — VAS US DOPPLER PRE CABG
LCCADSYS: -80 cm/s
LEFT ECA DIAS: -9 cm/s
LEFT VERTEBRAL DIAS: -7 cm/s
Left CCA dist dias: -16 cm/s
Left CCA prox dias: 13 cm/s
Left CCA prox sys: 109 cm/s
Left ICA dist dias: -21 cm/s
Left ICA dist sys: -64 cm/s
Left ICA prox dias: -11 cm/s
Left ICA prox sys: -34 cm/s
RCCADSYS: -56 cm/s
RCCAPDIAS: 2 cm/s
RCCAPSYS: 92 cm/s
RIGHT ECA DIAS: -5 cm/s
RIGHT VERTEBRAL DIAS: -6 cm/s

## 2017-05-07 LAB — PULMONARY FUNCTION TEST
DL/VA % pred: 141 %
DL/VA: 6.6 ml/min/mmHg/L
DLCO COR % PRED: 90 %
DLCO UNC: 29.46 ml/min/mmHg
DLCO cor: 29.3 ml/min/mmHg
DLCO unc % pred: 91 %
FEF 25-75 POST: 2.61 L/s
FEF 25-75 Pre: 1.82 L/sec
FEF2575-%Change-Post: 43 %
FEF2575-%Pred-Post: 66 %
FEF2575-%Pred-Pre: 46 %
FEV1-%Change-Post: 8 %
FEV1-%PRED-POST: 66 %
FEV1-%PRED-PRE: 61 %
FEV1-POST: 2.46 L
FEV1-PRE: 2.26 L
FEV1FVC-%Change-Post: 7 %
FEV1FVC-%Pred-Pre: 92 %
FEV6-%CHANGE-POST: 1 %
FEV6-%PRED-PRE: 67 %
FEV6-%Pred-Post: 68 %
FEV6-Post: 2.99 L
FEV6-Pre: 2.96 L
FEV6FVC-%PRED-POST: 102 %
FEV6FVC-%Pred-Pre: 102 %
FVC-%CHANGE-POST: 1 %
FVC-%PRED-PRE: 66 %
FVC-%Pred-Post: 67 %
FVC-POST: 2.99 L
FVC-PRE: 2.96 L
POST FEV6/FVC RATIO: 100 %
PRE FEV1/FVC RATIO: 76 %
Post FEV1/FVC ratio: 82 %
Pre FEV6/FVC Ratio: 100 %
RV % pred: 106 %
RV: 1.86 L
TLC % PRED: 73 %
TLC: 5.05 L

## 2017-05-07 LAB — SURGICAL PCR SCREEN
MRSA, PCR: NEGATIVE
STAPHYLOCOCCUS AUREUS: POSITIVE — AB

## 2017-05-07 LAB — COMPREHENSIVE METABOLIC PANEL
ALT: 18 U/L (ref 17–63)
AST: 20 U/L (ref 15–41)
Albumin: 3.7 g/dL (ref 3.5–5.0)
Alkaline Phosphatase: 50 U/L (ref 38–126)
Anion gap: 10 (ref 5–15)
BUN: 21 mg/dL — ABNORMAL HIGH (ref 6–20)
CHLORIDE: 106 mmol/L (ref 101–111)
CO2: 19 mmol/L — AB (ref 22–32)
Calcium: 8.8 mg/dL — ABNORMAL LOW (ref 8.9–10.3)
Creatinine, Ser: 1.83 mg/dL — ABNORMAL HIGH (ref 0.61–1.24)
GFR, EST AFRICAN AMERICAN: 53 mL/min — AB (ref >60)
GFR, EST NON AFRICAN AMERICAN: 46 mL/min — AB (ref >60)
Glucose, Bld: 98 mg/dL (ref 65–99)
POTASSIUM: 4 mmol/L (ref 3.5–5.1)
SODIUM: 135 mmol/L (ref 135–145)
Total Bilirubin: 0.9 mg/dL (ref 0.3–1.2)
Total Protein: 7 g/dL (ref 6.5–8.1)

## 2017-05-07 LAB — CBC
HCT: 43.3 % (ref 39.0–52.0)
Hemoglobin: 14.8 g/dL (ref 13.0–17.0)
MCH: 28 pg (ref 26.0–34.0)
MCHC: 34.2 g/dL (ref 30.0–36.0)
MCV: 81.9 fL (ref 78.0–100.0)
PLATELETS: 172 10*3/uL (ref 150–400)
RBC: 5.29 MIL/uL (ref 4.22–5.81)
RDW: 13.7 % (ref 11.5–15.5)
WBC: 7.1 10*3/uL (ref 4.0–10.5)

## 2017-05-07 LAB — PROTIME-INR
INR: 1.03
Prothrombin Time: 13.4 seconds (ref 11.4–15.2)

## 2017-05-07 LAB — BLOOD GAS, ARTERIAL
ACID-BASE DEFICIT: 1.6 mmol/L (ref 0.0–2.0)
BICARBONATE: 22.1 mmol/L (ref 20.0–28.0)
DRAWN BY: 449841
FIO2: 21
O2 SAT: 96.7 %
PH ART: 7.43 (ref 7.350–7.450)
Patient temperature: 98.6
pCO2 arterial: 33.9 mmHg (ref 32.0–48.0)
pO2, Arterial: 89.5 mmHg (ref 83.0–108.0)

## 2017-05-07 LAB — TYPE AND SCREEN
ABO/RH(D): B POS
ANTIBODY SCREEN: NEGATIVE

## 2017-05-07 LAB — ABO/RH: ABO/RH(D): B POS

## 2017-05-07 LAB — HEMOGLOBIN A1C
HEMOGLOBIN A1C: 6.4 % — AB (ref 4.8–5.6)
MEAN PLASMA GLUCOSE: 136.98 mg/dL

## 2017-05-07 LAB — APTT: APTT: 33 s (ref 24–36)

## 2017-05-07 MED ORDER — ALBUTEROL SULFATE (2.5 MG/3ML) 0.083% IN NEBU
2.5000 mg | INHALATION_SOLUTION | Freq: Once | RESPIRATORY_TRACT | Status: AC
  Administered 2017-05-07: 2.5 mg via RESPIRATORY_TRACT

## 2017-05-07 NOTE — Progress Notes (Signed)
I called a prescription for Mupirocin ointment to [facility], [place]

## 2017-05-10 ENCOUNTER — Ambulatory Visit (INDEPENDENT_AMBULATORY_CARE_PROVIDER_SITE_OTHER): Payer: BLUE CROSS/BLUE SHIELD | Admitting: Thoracic Surgery (Cardiothoracic Vascular Surgery)

## 2017-05-10 ENCOUNTER — Encounter: Payer: Self-pay | Admitting: Thoracic Surgery (Cardiothoracic Vascular Surgery)

## 2017-05-10 VITALS — BP 128/79 | HR 75 | Resp 16 | Ht 70.0 in | Wt 229.0 lb

## 2017-05-10 DIAGNOSIS — I351 Nonrheumatic aortic (valve) insufficiency: Secondary | ICD-10-CM

## 2017-05-10 MED ORDER — POTASSIUM CHLORIDE 2 MEQ/ML IV SOLN
80.0000 meq | INTRAVENOUS | Status: DC
  Filled 2017-05-10 (×2): qty 40

## 2017-05-10 MED ORDER — DOPAMINE-DEXTROSE 3.2-5 MG/ML-% IV SOLN
0.0000 ug/kg/min | INTRAVENOUS | Status: AC
  Administered 2017-05-11: 5 ug/kg/min via INTRAVENOUS
  Filled 2017-05-10 (×2): qty 250

## 2017-05-10 MED ORDER — SODIUM CHLORIDE 0.9 % IV SOLN
INTRAVENOUS | Status: AC
  Administered 2017-05-11: 1.6 [IU]/h via INTRAVENOUS
  Filled 2017-05-10 (×2): qty 1

## 2017-05-10 MED ORDER — METOPROLOL TARTRATE 12.5 MG HALF TABLET: 12.5000 mg | ORAL_TABLET | Freq: Once | ORAL | Status: DC

## 2017-05-10 MED ORDER — SODIUM CHLORIDE 0.9 % IV SOLN
1500.0000 mg | INTRAVENOUS | Status: AC
  Administered 2017-05-11: 1000 mg via INTRAVENOUS
  Filled 2017-05-10 (×2): qty 1500

## 2017-05-10 MED ORDER — DEXTROSE 5 % IV SOLN
750.0000 mg | INTRAVENOUS | Status: DC
  Filled 2017-05-10 (×2): qty 750

## 2017-05-10 MED ORDER — PLASMA-LYTE 148 IV SOLN
INTRAVENOUS | Status: DC
  Filled 2017-05-10 (×2): qty 2.5

## 2017-05-10 MED ORDER — DEXTROSE 5 % IV SOLN
1.5000 g | INTRAVENOUS | Status: AC
  Administered 2017-05-11: 1.5 g via INTRAVENOUS
  Administered 2017-05-11: .75 g via INTRAVENOUS
  Filled 2017-05-10 (×3): qty 1.5

## 2017-05-10 MED ORDER — DEXMEDETOMIDINE HCL IN NACL 400 MCG/100ML IV SOLN
0.1000 ug/kg/h | INTRAVENOUS | Status: AC
  Administered 2017-05-11: .7 ug/kg/h via INTRAVENOUS
  Filled 2017-05-10 (×2): qty 100

## 2017-05-10 MED ORDER — CHLORHEXIDINE GLUCONATE 0.12 % MT SOLN
15.0000 mL | Freq: Once | OROMUCOSAL | Status: AC
  Administered 2017-05-11: 15 mL via OROMUCOSAL
  Filled 2017-05-10: qty 15

## 2017-05-10 MED ORDER — SODIUM CHLORIDE 0.9 % IV SOLN
30.0000 ug/min | INTRAVENOUS | Status: AC
  Administered 2017-05-11: 15 ug/min via INTRAVENOUS
  Filled 2017-05-10 (×2): qty 2

## 2017-05-10 MED ORDER — KENNESTONE BLOOD CARDIOPLEGIA (KBC) MANNITOL SYRINGE (20%, 32ML)
32.0000 mL | Freq: Once | INTRAVENOUS | Status: DC
  Filled 2017-05-10: qty 32

## 2017-05-10 MED ORDER — TRANEXAMIC ACID 1000 MG/10ML IV SOLN
1.5000 mg/kg/h | INTRAVENOUS | Status: AC
  Administered 2017-05-11: 1.5 mg/kg/h via INTRAVENOUS
  Filled 2017-05-10 (×2): qty 25

## 2017-05-10 MED ORDER — SODIUM CHLORIDE 0.9 % IV SOLN
INTRAVENOUS | Status: DC
  Filled 2017-05-10 (×2): qty 30

## 2017-05-10 MED ORDER — TRANEXAMIC ACID (OHS) PUMP PRIME SOLUTION
2.0000 mg/kg | INTRAVENOUS | Status: DC
  Filled 2017-05-10 (×2): qty 2.15

## 2017-05-10 MED ORDER — MAGNESIUM SULFATE 50 % IJ SOLN
40.0000 meq | INTRAMUSCULAR | Status: DC
  Filled 2017-05-10 (×3): qty 9.85

## 2017-05-10 MED ORDER — NITROGLYCERIN IN D5W 200-5 MCG/ML-% IV SOLN
2.0000 ug/min | INTRAVENOUS | Status: DC
  Filled 2017-05-10 (×2): qty 250

## 2017-05-10 MED ORDER — TRANEXAMIC ACID (OHS) BOLUS VIA INFUSION
15.0000 mg/kg | INTRAVENOUS | Status: AC
  Administered 2017-05-11: 1612.5 mg via INTRAVENOUS
  Filled 2017-05-10: qty 1613

## 2017-05-10 MED ORDER — EPINEPHRINE PF 1 MG/ML IJ SOLN
0.0000 ug/min | INTRAVENOUS | Status: DC
  Filled 2017-05-10 (×2): qty 4

## 2017-05-10 MED ORDER — KENNESTONE BLOOD CARDIOPLEGIA VIAL
13.0000 mL | Freq: Once | Status: DC
  Filled 2017-05-10: qty 13

## 2017-05-10 MED ORDER — VANCOMYCIN HCL 1000 MG IV SOLR
INTRAVENOUS | Status: AC
  Administered 2017-05-11: 1000 mL
  Filled 2017-05-10 (×2): qty 1000

## 2017-05-10 NOTE — Patient Instructions (Signed)
   Continue taking all current medications without change through the day before surgery.  Have nothing to eat or drink after midnight the night before surgery.  On the morning of surgery take only carvedilol with a sip of water.

## 2017-05-10 NOTE — Progress Notes (Signed)
[address]       [place] [address]             [phone]     CARDIOTHORACIC SURGERY OFFICE NOTE  Referring Provider is Bensimhon, Shaune Pascal, MD PCP is Sharilyn Sites, MD   HPI:  Patient returns to the office today for follow-up of severe symptomatic primary aortic insufficiency with tentative plans to proceed with elective aortic valve repair tomorrow. He was originally seen in consultation on 04/23/2017. Since then he remains clinically stable and he reports no new problems or complaints.   Current Outpatient Prescriptions  Medication Sig Dispense Refill  . acetaminophen (TYLENOL) 500 MG tablet Take 1,000 mg by mouth every 6 (six) hours as needed for moderate pain or headache.     . carvedilol (COREG) 25 MG tablet Take 1 tablet (25 mg total) by mouth 2 (two) times daily with a meal. 60 tablet 6  . clobetasol cream (TEMOVATE) 6.76 % Apply 1 application topically every other day.    Marland Kitchen ENTRESTO 97-103 MG TAKE 1 TABLET BY MOUTH TWICE DAILY 60 tablet 3  . furosemide (LASIX) 40 MG tablet Take 2 tablets (80 mg total) by mouth daily. (Patient taking differently: Take 40 mg by mouth 2 (two) times daily. ) 60 tablet 2  . hydrALAZINE (APRESOLINE) 25 MG tablet Take 1 tablet (25 mg total) by mouth 3 (three) times daily. 90 tablet 3   No current facility-administered medications for this visit.    Facility-Administered Medications Ordered in Other Visits  Medication Dose Route Frequency Provider Last Rate Last Dose  . [START ON 05/11/2017] cefUROXime (ZINACEF) 1.5 g in dextrose 5 % 50 mL IVPB  1.5 g Intravenous To OR Rexene Alberts, MD      . Derrill Memo ON 05/11/2017] cefUROXime (ZINACEF) 750 mg in dextrose 5 % 50 mL IVPB  750 mg Intravenous To OR Rexene Alberts, MD      . Derrill Memo ON 05/11/2017] chlorhexidine (PERIDEX) 0.12 % solution 15 mL  15 mL Mouth/Throat Once Rexene Alberts, MD      . Derrill Memo ON 05/11/2017] dexmedetomidine (PRECEDEX) 400 MCG/100ML (4 mcg/mL)  infusion  0.1-0.7 mcg/kg/hr Intravenous To OR Rexene Alberts, MD      . Derrill Memo ON 05/11/2017] DOPamine (INTROPIN) 800 mg in dextrose 5 % 250 mL (3.2 mg/mL) infusion  0-10 mcg/kg/min Intravenous To OR Rexene Alberts, MD      . Derrill Memo ON 05/11/2017] EPINEPHrine (ADRENALIN) 4 mg in dextrose 5 % 250 mL (0.016 mg/mL) infusion  0-10 mcg/min Intravenous To OR Rexene Alberts, MD      . Derrill Memo ON 05/11/2017] heparin 2,500 Units, papaverine 30 mg in electrolyte-148 (PLASMALYTE-148) 500 mL irrigation   Irrigation To OR Rexene Alberts, MD      . Derrill Memo ON 05/11/2017] heparin 30,000 units/NS 1000 mL solution for CELLSAVER   Other To OR Rexene Alberts, MD      . Derrill Memo ON 05/11/2017] insulin regular (NOVOLIN R,HUMULIN R) 100 Units in sodium chloride 0.9 % 100 mL (1 Units/mL) infusion   Intravenous To OR Rexene Alberts, MD      . Derrill Memo ON 05/11/2017] [place] Blood Cardioplegia (KBC) lidocaine 2% Syringe (51mL)  13 mL Intracoronary Once Rexene Alberts, MD      . Derrill Memo ON 05/11/2017] [place] Blood Cardioplegia (KBC) lidocaine 2% Syringe (63mL)  13 mL Intracoronary Once Rexene Alberts, MD      . Derrill Memo ON 05/11/2017]  [place] Blood Cardioplegia (KBC) mannitol 20% Syringe (37mL)  32 mL Intracoronary Once Rexene Alberts, MD      . Derrill Memo ON 05/11/2017] [facility] Blood Cardioplegia (KBC) mannitol 20% Syringe (62mL)  32 mL Intracoronary Once Rexene Alberts, MD      . Derrill Memo ON 05/11/2017] magnesium sulfate (IV Push/IM) injection 40 mEq  40 mEq Other To OR Rexene Alberts, MD      . Derrill Memo ON 05/11/2017] metoprolol tartrate (LOPRESSOR) tablet 12.5 mg  12.5 mg Oral Once Rexene Alberts, MD      . Derrill Memo ON 05/11/2017] nitroGLYCERIN 50 mg in dextrose 5 % 250 mL (0.2 mg/mL) infusion  2-200 mcg/min Intravenous To OR Rexene Alberts, MD      . Derrill Memo ON 05/11/2017] phenylephrine (NEO-SYNEPHRINE) 20 mg in sodium chloride 0.9 % 250 mL (0.08 mg/mL) infusion  30-200 mcg/min Intravenous To OR Rexene Alberts, MD      . Derrill Memo ON 05/11/2017] potassium chloride injection 80 mEq  80 mEq Other To OR Rexene Alberts, MD      . Derrill Memo ON 05/11/2017] tranexamic acid (CYKLOKAPRON) 2,500 mg in sodium chloride 0.9 % 250 mL (10 mg/mL) infusion  1.5 mg/kg/hr Intravenous To OR Rexene Alberts, MD      . Derrill Memo ON 05/11/2017] tranexamic acid (CYKLOKAPRON) bolus via infusion - over 30 minutes 1,612.5 mg  15 mg/kg Intravenous To OR Rexene Alberts, MD      . Derrill Memo ON 05/11/2017] tranexamic acid (CYKLOKAPRON) pump prime solution 215 mg  2 mg/kg Intracatheter To OR Rexene Alberts, MD      . Derrill Memo ON 05/11/2017] vancomycin (VANCOCIN) 1,000 mg in sodium chloride 0.9 % 1,000 mL irrigation   Irrigation To OR Rexene Alberts, MD      . Derrill Memo ON 05/11/2017] vancomycin (VANCOCIN) 1,500 mg in sodium chloride 0.9 % 250 mL IVPB  1,500 mg Intravenous To OR Rexene Alberts, MD          Physical Exam:   BP 128/79 (BP Location: Right Arm, Patient Position: Sitting, Cuff Size: Large)   Pulse 75   Resp 16   Ht 5\' 10"  (1.778 m)   Wt 229 lb (103.9 kg)   SpO2 96% Comment: ON RA  BMI 32.86 kg/m   General:  Well-appearing  Chest:   Clear to auscultation  CV:   Regular rate and rhythm with diastolic murmur  Incisions:  n/a  Abdomen:  Soft and nontender  Extremities:  Warm and well-perfused  Diagnostic Tests:  CHEST  2 VIEW  COMPARISON:  June 17, 2016  FINDINGS: There is no edema or consolidation. There is cardiomegaly with pulmonary vascularity within normal limits. No adenopathy. No bone lesions.  IMPRESSION: Persistent cardiomegaly.  No edema or consolidation.   Electronically Signed   By: Lowella Grip III M.D.   On: 05/07/2017 11:36    Impression:  Patient has stage D severe symptomatic primary aortic insufficiency with nonischemic cardiomyopathy and chronic combined systolic and diastolic congestive heart failure.  At present he describes stable symptoms of exertional  shortness of breath and fatigue that occur only with more strenuous physical exertion. Symptoms get worse quickly if the patient stops taking diuretics for even a relatively brief period time. I have personally reviewed the patient's recent transthoracic and transesophageal echocardiograms and diagnostic cardiac catheterization. Patient has at least moderate left ventricular systolic dysfunction and moderate diastolic dysfunction. There is moderate left ventricular chamber enlargement. The aortic valve is trileaflet.  There appears to be some prolapse of the noncoronary leaflet with severe aortic insufficiency. The aortic valve leaflets are thin and there are no signs of perforation or other abnormalities to suggest a previous history of endocarditis. I agree that surgical intervention is indicated and the patient appears to be good candidate for aortic valve repair.   Plan:  The patient and his wife were counseled at length regarding treatment alternatives for management of severe aortic insufficiency including continued medical therapy versus proceeding with aortic valve repair or replacement.  The natural history of aortic insufficiency was reviewed, as was long term prognosis with medical therapy alone.  Surgical options were discussed at length including conventional surgical aortic valve repair or replacement through either a full median sternotomy or using minimally invasive techniques.  The many potential benefits of valve repair were discussed. Concerns regarding long-term durability following valve repair were reviewed, as were the potential improvements associated with recently developed prosthetic ring designed for use in patients with aortic insufficiency (the HAART Biostable ring annuloplasty system). Expectations for his postoperative convalescence at been discussed.  The patient understands and accepts all potential associated risks of surgery including but not limited to risk of death, stroke,  myocardial infarction, congestive heart failure, respiratory failure, renal failure, pneumonia, bleeding requiring blood transfusion and or reexploration, arrhythmia, heart block or bradycardia requiring permanent pacemaker, aortic dissection or other major vascular complication, pleural effusions or other delayed complications related to continued congestive heart failure, and other late complications related to valve repair or replacement including structural valve deterioration and failure, thrombosis, endocarditis, or paravalvular leak.  We plan to proceed with surgery tomorrow.  I spent in excess of 15 minutes during the conduct of this office consultation and >50% of this time involved direct face-to-face encounter with the patient for counseling and/or coordination of their care.   Valentina Gu. Roxy Manns, MD 05/10/2017 1:29 PM

## 2017-05-11 ENCOUNTER — Inpatient Hospital Stay (HOSPITAL_COMMUNITY): Payer: BLUE CROSS/BLUE SHIELD | Admitting: Anesthesiology

## 2017-05-11 ENCOUNTER — Inpatient Hospital Stay (HOSPITAL_COMMUNITY)
Admission: RE | Admit: 2017-05-11 | Discharge: 2017-05-16 | DRG: 219 | Disposition: A | Discharge status: Home / Self Care | Payer: BLUE CROSS/BLUE SHIELD | Attending: Thoracic Surgery (Cardiothoracic Vascular Surgery) | Admitting: Thoracic Surgery (Cardiothoracic Vascular Surgery)

## 2017-05-11 ENCOUNTER — Inpatient Hospital Stay (HOSPITAL_COMMUNITY): Payer: BLUE CROSS/BLUE SHIELD

## 2017-05-11 ENCOUNTER — Encounter (HOSPITAL_COMMUNITY): Payer: Self-pay | Admitting: Thoracic Surgery (Cardiothoracic Vascular Surgery)

## 2017-05-11 ENCOUNTER — Encounter (HOSPITAL_COMMUNITY)
Admission: RE | Disposition: A | Discharge status: Home / Self Care | Payer: Self-pay | Source: Home / Self Care | Attending: Thoracic Surgery (Cardiothoracic Vascular Surgery)

## 2017-05-11 DIAGNOSIS — K5900 Constipation, unspecified: Secondary | ICD-10-CM | POA: Diagnosis not present

## 2017-05-11 DIAGNOSIS — I083 Combined rheumatic disorders of mitral, aortic and tricuspid valves: Secondary | ICD-10-CM | POA: Diagnosis not present

## 2017-05-11 DIAGNOSIS — N183 Chronic kidney disease, stage 3 unspecified: Secondary | ICD-10-CM | POA: Diagnosis present

## 2017-05-11 DIAGNOSIS — R0602 Shortness of breath: Secondary | ICD-10-CM | POA: Diagnosis present

## 2017-05-11 DIAGNOSIS — I429 Cardiomyopathy, unspecified: Secondary | ICD-10-CM | POA: Diagnosis not present

## 2017-05-11 DIAGNOSIS — D62 Acute posthemorrhagic anemia: Secondary | ICD-10-CM | POA: Diagnosis not present

## 2017-05-11 DIAGNOSIS — N170 Acute kidney failure with tubular necrosis: Secondary | ICD-10-CM | POA: Diagnosis present

## 2017-05-11 DIAGNOSIS — I5042 Chronic combined systolic (congestive) and diastolic (congestive) heart failure: Secondary | ICD-10-CM | POA: Diagnosis not present

## 2017-05-11 DIAGNOSIS — Z79899 Other long term (current) drug therapy: Secondary | ICD-10-CM | POA: Diagnosis not present

## 2017-05-11 DIAGNOSIS — E669 Obesity, unspecified: Secondary | ICD-10-CM | POA: Diagnosis not present

## 2017-05-11 DIAGNOSIS — Z8701 Personal history of pneumonia (recurrent): Secondary | ICD-10-CM

## 2017-05-11 DIAGNOSIS — I5022 Chronic systolic (congestive) heart failure: Secondary | ICD-10-CM | POA: Diagnosis not present

## 2017-05-11 DIAGNOSIS — I351 Nonrheumatic aortic (valve) insufficiency: Secondary | ICD-10-CM | POA: Diagnosis not present

## 2017-05-11 DIAGNOSIS — R51 Headache: Secondary | ICD-10-CM | POA: Diagnosis not present

## 2017-05-11 DIAGNOSIS — I130 Hypertensive heart and chronic kidney disease with heart failure and stage 1 through stage 4 chronic kidney disease, or unspecified chronic kidney disease: Secondary | ICD-10-CM | POA: Diagnosis not present

## 2017-05-11 DIAGNOSIS — G4733 Obstructive sleep apnea (adult) (pediatric): Secondary | ICD-10-CM | POA: Diagnosis present

## 2017-05-11 DIAGNOSIS — I428 Other cardiomyopathies: Secondary | ICD-10-CM

## 2017-05-11 DIAGNOSIS — D696 Thrombocytopenia, unspecified: Secondary | ICD-10-CM | POA: Diagnosis not present

## 2017-05-11 DIAGNOSIS — J9811 Atelectasis: Secondary | ICD-10-CM | POA: Diagnosis not present

## 2017-05-11 DIAGNOSIS — Z9889 Other specified postprocedural states: Secondary | ICD-10-CM

## 2017-05-11 DIAGNOSIS — Z6832 Body mass index (BMI) 32.0-32.9, adult: Secondary | ICD-10-CM | POA: Diagnosis not present

## 2017-05-11 DIAGNOSIS — I509 Heart failure, unspecified: Secondary | ICD-10-CM

## 2017-05-11 DIAGNOSIS — Z9119 Patient's noncompliance with other medical treatment and regimen: Secondary | ICD-10-CM

## 2017-05-11 DIAGNOSIS — Z4682 Encounter for fitting and adjustment of non-vascular catheter: Secondary | ICD-10-CM | POA: Diagnosis not present

## 2017-05-11 DIAGNOSIS — I517 Cardiomegaly: Secondary | ICD-10-CM | POA: Diagnosis not present

## 2017-05-11 DIAGNOSIS — I10 Essential (primary) hypertension: Secondary | ICD-10-CM | POA: Diagnosis present

## 2017-05-11 HISTORY — DX: Other specified postprocedural states: Z98.890

## 2017-05-11 HISTORY — PX: AORTIC VALVE REPAIR: SHX6306

## 2017-05-11 HISTORY — PX: TEE WITHOUT CARDIOVERSION: SHX5443

## 2017-05-11 LAB — POCT I-STAT, CHEM 8
BUN: 24 mg/dL — ABNORMAL HIGH (ref 6–20)
BUN: 26 mg/dL — ABNORMAL HIGH (ref 6–20)
BUN: 24 mg/dL — AB (ref 6–20)
BUN: 24 mg/dL — AB (ref 6–20)
BUN: 25 mg/dL — AB (ref 6–20)
BUN: 23 mg/dL — ABNORMAL HIGH (ref 6–20)
BUN: 23 mg/dL — AB (ref 6–20)
BUN: 19 mg/dL (ref 6–20)
CALCIUM ION: 0.98 mmol/L — AB (ref 1.15–1.40)
CALCIUM ION: 1.08 mmol/L — AB (ref 1.15–1.40)
CHLORIDE: 106 mmol/L (ref 101–111)
CHLORIDE: 101 mmol/L (ref 101–111)
CHLORIDE: 105 mmol/L (ref 101–111)
CHLORIDE: 106 mmol/L (ref 101–111)
CHLORIDE: 101 mmol/L (ref 101–111)
CHLORIDE: 101 mmol/L (ref 101–111)
CREATININE: 1.4 mg/dL — AB (ref 0.61–1.24)
CREATININE: 1.7 mg/dL — AB (ref 0.61–1.24)
Calcium, Ion: 1.21 mmol/L (ref 1.15–1.40)
Calcium, Ion: 1.16 mmol/L (ref 1.15–1.40)
Calcium, Ion: 1.07 mmol/L — ABNORMAL LOW (ref 1.15–1.40)
Calcium, Ion: 1.08 mmol/L — ABNORMAL LOW (ref 1.15–1.40)
Calcium, Ion: 0.9 mmol/L — ABNORMAL LOW (ref 1.15–1.40)
Calcium, Ion: 0.97 mmol/L — ABNORMAL LOW (ref 1.15–1.40)
Chloride: 106 mmol/L (ref 101–111)
Chloride: 110 mmol/L (ref 101–111)
Creatinine, Ser: 1.7 mg/dL — ABNORMAL HIGH (ref 0.61–1.24)
Creatinine, Ser: 1.4 mg/dL — ABNORMAL HIGH (ref 0.61–1.24)
Creatinine, Ser: 1.6 mg/dL — ABNORMAL HIGH (ref 0.61–1.24)
Creatinine, Ser: 1.6 mg/dL — ABNORMAL HIGH (ref 0.61–1.24)
Creatinine, Ser: 1.4 mg/dL — ABNORMAL HIGH (ref 0.61–1.24)
Creatinine, Ser: 1.4 mg/dL — ABNORMAL HIGH (ref 0.61–1.24)
GLUCOSE: 125 mg/dL — AB (ref 65–99)
GLUCOSE: 137 mg/dL — AB (ref 65–99)
Glucose, Bld: 141 mg/dL — ABNORMAL HIGH (ref 65–99)
Glucose, Bld: 109 mg/dL — ABNORMAL HIGH (ref 65–99)
Glucose, Bld: 139 mg/dL — ABNORMAL HIGH (ref 65–99)
Glucose, Bld: 151 mg/dL — ABNORMAL HIGH (ref 65–99)
Glucose, Bld: 187 mg/dL — ABNORMAL HIGH (ref 65–99)
Glucose, Bld: 178 mg/dL — ABNORMAL HIGH (ref 65–99)
HCT: 40 % (ref 39.0–52.0)
HCT: 40 % (ref 39.0–52.0)
HEMATOCRIT: 46 % (ref 39.0–52.0)
HEMATOCRIT: 46 % (ref 39.0–52.0)
HEMATOCRIT: 37 % — AB (ref 39.0–52.0)
HEMATOCRIT: 39 % (ref 39.0–52.0)
HEMATOCRIT: 29 % — AB (ref 39.0–52.0)
HEMATOCRIT: 33 % — AB (ref 39.0–52.0)
HEMOGLOBIN: 15.6 g/dL (ref 13.0–17.0)
HEMOGLOBIN: 15.6 g/dL (ref 13.0–17.0)
HEMOGLOBIN: 9.9 g/dL — AB (ref 13.0–17.0)
Hemoglobin: 12.6 g/dL — ABNORMAL LOW (ref 13.0–17.0)
Hemoglobin: 13.3 g/dL (ref 13.0–17.0)
Hemoglobin: 13.6 g/dL (ref 13.0–17.0)
Hemoglobin: 11.2 g/dL — ABNORMAL LOW (ref 13.0–17.0)
Hemoglobin: 13.6 g/dL (ref 13.0–17.0)
POTASSIUM: 4.1 mmol/L (ref 3.5–5.1)
POTASSIUM: 4.5 mmol/L (ref 3.5–5.1)
POTASSIUM: 5.4 mmol/L — AB (ref 3.5–5.1)
POTASSIUM: 5.2 mmol/L — AB (ref 3.5–5.1)
POTASSIUM: 4.8 mmol/L (ref 3.5–5.1)
Potassium: 3.9 mmol/L (ref 3.5–5.1)
Potassium: 5.7 mmol/L — ABNORMAL HIGH (ref 3.5–5.1)
Potassium: 4.2 mmol/L (ref 3.5–5.1)
SODIUM: 141 mmol/L (ref 135–145)
SODIUM: 139 mmol/L (ref 135–145)
SODIUM: 136 mmol/L (ref 135–145)
SODIUM: 136 mmol/L (ref 135–145)
SODIUM: 136 mmol/L (ref 135–145)
SODIUM: 134 mmol/L — AB (ref 135–145)
SODIUM: 136 mmol/L (ref 135–145)
Sodium: 134 mmol/L — ABNORMAL LOW (ref 135–145)
TCO2: 21 mmol/L — AB (ref 22–32)
TCO2: 24 mmol/L (ref 22–32)
TCO2: 24 mmol/L (ref 22–32)
TCO2: 22 mmol/L (ref 22–32)
TCO2: 24 mmol/L (ref 22–32)
TCO2: 22 mmol/L (ref 22–32)
TCO2: 25 mmol/L (ref 22–32)
TCO2: 28 mmol/L (ref 22–32)

## 2017-05-11 LAB — GLUCOSE, CAPILLARY
GLUCOSE-CAPILLARY: 147 mg/dL — AB (ref 65–99)
GLUCOSE-CAPILLARY: 156 mg/dL — AB (ref 65–99)
GLUCOSE-CAPILLARY: 178 mg/dL — AB (ref 65–99)
GLUCOSE-CAPILLARY: 175 mg/dL — AB (ref 65–99)
GLUCOSE-CAPILLARY: 201 mg/dL — AB (ref 65–99)
GLUCOSE-CAPILLARY: 184 mg/dL — AB (ref 65–99)
Glucose-Capillary: 138 mg/dL — ABNORMAL HIGH (ref 65–99)
Glucose-Capillary: 137 mg/dL — ABNORMAL HIGH (ref 65–99)

## 2017-05-11 LAB — POCT I-STAT 4, (NA,K, GLUC, HGB,HCT)
GLUCOSE: 155 mg/dL — AB (ref 65–99)
HEMATOCRIT: 44 % (ref 39.0–52.0)
Hemoglobin: 15 g/dL (ref 13.0–17.0)
Potassium: 4.7 mmol/L (ref 3.5–5.1)
Sodium: 139 mmol/L (ref 135–145)

## 2017-05-11 LAB — POCT I-STAT 3, ART BLOOD GAS (G3+)
ACID-BASE DEFICIT: 6 mmol/L — AB (ref 0.0–2.0)
Acid-base deficit: 4 mmol/L — ABNORMAL HIGH (ref 0.0–2.0)
Acid-base deficit: 7 mmol/L — ABNORMAL HIGH (ref 0.0–2.0)
Acid-base deficit: 7 mmol/L — ABNORMAL HIGH (ref 0.0–2.0)
Bicarbonate: 23.1 mmol/L (ref 20.0–28.0)
Bicarbonate: 20 mmol/L (ref 20.0–28.0)
Bicarbonate: 18.9 mmol/L — ABNORMAL LOW (ref 20.0–28.0)
Bicarbonate: 19.4 mmol/L — ABNORMAL LOW (ref 20.0–28.0)
O2 SAT: 89 %
O2 Saturation: 100 %
O2 Saturation: 93 %
O2 Saturation: 93 %
PCO2 ART: 47.3 mmHg (ref 32.0–48.0)
PCO2 ART: 44.7 mmHg (ref 32.0–48.0)
PCO2 ART: 37.7 mmHg (ref 32.0–48.0)
PCO2 ART: 36.5 mmHg (ref 32.0–48.0)
PH ART: 7.334 — AB (ref 7.350–7.450)
PO2 ART: 276 mmHg — AB (ref 83.0–108.0)
Patient temperature: 36.9
Patient temperature: 37.1
Patient temperature: 37.2
TCO2: 24 mmol/L (ref 22–32)
TCO2: 21 mmol/L — ABNORMAL LOW (ref 22–32)
TCO2: 20 mmol/L — ABNORMAL LOW (ref 22–32)
TCO2: 20 mmol/L — ABNORMAL LOW (ref 22–32)
pH, Arterial: 7.296 — ABNORMAL LOW (ref 7.350–7.450)
pH, Arterial: 7.259 — ABNORMAL LOW (ref 7.350–7.450)
pH, Arterial: 7.31 — ABNORMAL LOW (ref 7.350–7.450)
pO2, Arterial: 64 mmHg — ABNORMAL LOW (ref 83.0–108.0)
pO2, Arterial: 75 mmHg — ABNORMAL LOW (ref 83.0–108.0)
pO2, Arterial: 72 mmHg — ABNORMAL LOW (ref 83.0–108.0)

## 2017-05-11 LAB — MAGNESIUM: Magnesium: 3 mg/dL — ABNORMAL HIGH (ref 1.7–2.4)

## 2017-05-11 LAB — PROTIME-INR
INR: 1.32
Prothrombin Time: 16.2 seconds — ABNORMAL HIGH (ref 11.4–15.2)

## 2017-05-11 LAB — COOXEMETRY PANEL
Carboxyhemoglobin: 0.6 % (ref 0.5–1.5)
Methemoglobin: 1.3 % (ref 0.0–1.5)
O2 Saturation: 62.4 %
Total hemoglobin: 15 g/dL (ref 12.0–16.0)

## 2017-05-11 LAB — CBC
HEMATOCRIT: 43.9 % (ref 39.0–52.0)
HEMATOCRIT: 41.5 % (ref 39.0–52.0)
HEMOGLOBIN: 14.3 g/dL (ref 13.0–17.0)
Hemoglobin: 14.8 g/dL (ref 13.0–17.0)
MCH: 27.6 pg (ref 26.0–34.0)
MCH: 28.3 pg (ref 26.0–34.0)
MCHC: 33.7 g/dL (ref 30.0–36.0)
MCHC: 34.5 g/dL (ref 30.0–36.0)
MCV: 81.9 fL (ref 78.0–100.0)
MCV: 82.2 fL (ref 78.0–100.0)
PLATELETS: 120 10*3/uL — AB (ref 150–400)
Platelets: 120 10*3/uL — ABNORMAL LOW (ref 150–400)
RBC: 5.36 MIL/uL (ref 4.22–5.81)
RBC: 5.05 MIL/uL (ref 4.22–5.81)
RDW: 14.2 % (ref 11.5–15.5)
RDW: 14 % (ref 11.5–15.5)
WBC: 15.5 10*3/uL — AB (ref 4.0–10.5)
WBC: 13.9 10*3/uL — AB (ref 4.0–10.5)

## 2017-05-11 LAB — PLATELET COUNT: PLATELETS: 140 10*3/uL — AB (ref 150–400)

## 2017-05-11 LAB — HEMOGLOBIN AND HEMATOCRIT, BLOOD
HEMATOCRIT: 39.2 % (ref 39.0–52.0)
HEMOGLOBIN: 13.2 g/dL (ref 13.0–17.0)

## 2017-05-11 LAB — CREATININE, SERUM
Creatinine, Ser: 1.62 mg/dL — ABNORMAL HIGH (ref 0.61–1.24)
GFR calc non Af Amer: 53 mL/min — ABNORMAL LOW (ref >60)

## 2017-05-11 LAB — APTT: APTT: 26 s (ref 24–36)

## 2017-05-11 SURGERY — REPAIR, AORTIC VALVE
Anesthesia: General | Site: Chest

## 2017-05-11 MED ORDER — POTASSIUM CHLORIDE 10 MEQ/50ML IV SOLN: 10.0000 meq | INTRAVENOUS | Status: AC

## 2017-05-11 MED ORDER — VANCOMYCIN HCL IN DEXTROSE 1-5 GM/200ML-% IV SOLN
1000.0000 mg | Freq: Once | INTRAVENOUS | Status: AC
  Administered 2017-05-11: 1000 mg via INTRAVENOUS
  Filled 2017-05-11: qty 200

## 2017-05-11 MED ORDER — ARTIFICIAL TEARS OPHTHALMIC OINT
TOPICAL_OINTMENT | OPHTHALMIC | Status: AC
  Filled 2017-05-11: qty 3.5

## 2017-05-11 MED ORDER — MORPHINE SULFATE (PF) 2 MG/ML IV SOLN: 1.0000 mg | INTRAVENOUS | Status: DC | PRN

## 2017-05-11 MED ORDER — MILRINONE LACTATE IN DEXTROSE 20-5 MG/100ML-% IV SOLN
0.0000 ug/kg/min | INTRAVENOUS | Status: DC
  Administered 2017-05-11 (×2): 0.5 ug/kg/min via INTRAVENOUS
  Administered 2017-05-12: 0.2 ug/kg/min via INTRAVENOUS
  Administered 2017-05-12: 0.5 ug/kg/min via INTRAVENOUS
  Filled 2017-05-11 (×4): qty 100

## 2017-05-11 MED ORDER — SODIUM CHLORIDE 0.9 % IJ SOLN
INTRAMUSCULAR | Status: AC
  Filled 2017-05-11: qty 10

## 2017-05-11 MED ORDER — ROCURONIUM BROMIDE 10 MG/ML (PF) SYRINGE
PREFILLED_SYRINGE | INTRAVENOUS | Status: AC
  Filled 2017-05-11: qty 5

## 2017-05-11 MED ORDER — ALBUMIN HUMAN 5 % IV SOLN
INTRAVENOUS | Status: DC | PRN
  Administered 2017-05-11: 13:00:00 via INTRAVENOUS

## 2017-05-11 MED ORDER — ASPIRIN EC 325 MG PO TBEC
325.0000 mg | DELAYED_RELEASE_TABLET | Freq: Every day | ORAL | Status: DC
  Administered 2017-05-12 – 2017-05-16 (×5): 325 mg via ORAL
  Filled 2017-05-11 (×5): qty 1

## 2017-05-11 MED ORDER — CHLORHEXIDINE GLUCONATE 0.12% ORAL RINSE (MEDLINE KIT)
15.0000 mL | Freq: Two times a day (BID) | OROMUCOSAL | Status: DC
  Administered 2017-05-12: 15 mL via OROMUCOSAL

## 2017-05-11 MED ORDER — TRAMADOL HCL 50 MG PO TABS
50.0000 mg | ORAL_TABLET | ORAL | Status: DC | PRN
  Administered 2017-05-15: 100 mg via ORAL
  Filled 2017-05-11: qty 2

## 2017-05-11 MED ORDER — METOPROLOL TARTRATE 5 MG/5ML IV SOLN
2.5000 mg | INTRAVENOUS | Status: DC | PRN
  Administered 2017-05-12 (×2): 2.5 mg via INTRAVENOUS
  Filled 2017-05-11: qty 5

## 2017-05-11 MED ORDER — PHENYLEPHRINE 40 MCG/ML (10ML) SYRINGE FOR IV PUSH (FOR BLOOD PRESSURE SUPPORT)
PREFILLED_SYRINGE | INTRAVENOUS | Status: AC
  Filled 2017-05-11: qty 10

## 2017-05-11 MED ORDER — PROPOFOL 10 MG/ML IV BOLUS
INTRAVENOUS | Status: DC | PRN
  Administered 2017-05-11 (×4): 50 mg via INTRAVENOUS

## 2017-05-11 MED ORDER — ACETAMINOPHEN 500 MG PO TABS
1000.0000 mg | ORAL_TABLET | Freq: Four times a day (QID) | ORAL | Status: DC
  Administered 2017-05-12 – 2017-05-15 (×14): 1000 mg via ORAL
  Filled 2017-05-11 (×16): qty 2

## 2017-05-11 MED ORDER — SODIUM CHLORIDE 0.9 % IV SOLN
0.0000 ug/min | INTRAVENOUS | Status: DC
  Filled 2017-05-11: qty 2

## 2017-05-11 MED ORDER — SODIUM CHLORIDE 0.9 % IV SOLN: INTRAVENOUS | Status: DC

## 2017-05-11 MED ORDER — CARVEDILOL 12.5 MG PO TABS
25.0000 mg | ORAL_TABLET | Freq: Once | ORAL | Status: AC
  Administered 2017-05-11: 25 mg via ORAL

## 2017-05-11 MED ORDER — PHENYLEPHRINE 40 MCG/ML (10ML) SYRINGE FOR IV PUSH (FOR BLOOD PRESSURE SUPPORT)
PREFILLED_SYRINGE | INTRAVENOUS | Status: DC | PRN
  Administered 2017-05-11: 80 ug via INTRAVENOUS
  Administered 2017-05-11: 40 ug via INTRAVENOUS

## 2017-05-11 MED ORDER — ARTIFICIAL TEARS OPHTHALMIC OINT
TOPICAL_OINTMENT | OPHTHALMIC | Status: DC | PRN
  Administered 2017-05-11: 1 via OPHTHALMIC

## 2017-05-11 MED ORDER — PHENYLEPHRINE HCL 10 MG/ML IJ SOLN
INTRAVENOUS | Status: DC | PRN
  Administered 2017-05-11: 25 ug/min via INTRAVENOUS

## 2017-05-11 MED ORDER — LIDOCAINE 2% (20 MG/ML) 5 ML SYRINGE
INTRAMUSCULAR | Status: AC
  Filled 2017-05-11: qty 5

## 2017-05-11 MED ORDER — BISACODYL 5 MG PO TBEC
10.0000 mg | DELAYED_RELEASE_TABLET | Freq: Every day | ORAL | Status: DC
  Administered 2017-05-13 – 2017-05-14 (×2): 10 mg via ORAL
  Filled 2017-05-11 (×3): qty 2

## 2017-05-11 MED ORDER — HEPARIN SODIUM (PORCINE) 1000 UNIT/ML IJ SOLN
INTRAMUSCULAR | Status: DC | PRN
  Administered 2017-05-11: 31000 [IU] via INTRAVENOUS

## 2017-05-11 MED ORDER — SODIUM CHLORIDE 0.9 % IR SOLN
Status: DC | PRN
  Administered 2017-05-11: 1000 mL

## 2017-05-11 MED ORDER — ACETAMINOPHEN 650 MG RE SUPP
650.0000 mg | Freq: Once | RECTAL | Status: AC
  Administered 2017-05-11: 650 mg via RECTAL

## 2017-05-11 MED ORDER — ACETAMINOPHEN 160 MG/5ML PO SOLN: 650.0000 mg | Freq: Once | ORAL | Status: AC

## 2017-05-11 MED ORDER — SODIUM CHLORIDE 0.9% FLUSH
3.0000 mL | Freq: Two times a day (BID) | INTRAVENOUS | Status: DC
  Administered 2017-05-12 – 2017-05-15 (×6): 3 mL via INTRAVENOUS

## 2017-05-11 MED ORDER — MIDAZOLAM HCL 10 MG/2ML IJ SOLN
INTRAMUSCULAR | Status: AC
  Filled 2017-05-11: qty 2

## 2017-05-11 MED ORDER — GLUTARALDEHYDE 0.625% SOAKING SOLUTION
TOPICAL | Status: AC
  Administered 2017-05-11: 1 via TOPICAL
  Filled 2017-05-11: qty 50

## 2017-05-11 MED ORDER — ONDANSETRON HCL 4 MG/2ML IJ SOLN
4.0000 mg | Freq: Four times a day (QID) | INTRAMUSCULAR | Status: DC | PRN
  Administered 2017-05-12: 4 mg via INTRAVENOUS
  Filled 2017-05-11 (×2): qty 2

## 2017-05-11 MED ORDER — ALBUMIN HUMAN 5 % IV SOLN
250.0000 mL | INTRAVENOUS | Status: AC | PRN
  Administered 2017-05-11: 250 mL via INTRAVENOUS

## 2017-05-11 MED ORDER — HYDRALAZINE HCL 20 MG/ML IJ SOLN
10.0000 mg | INTRAMUSCULAR | Status: DC | PRN
  Administered 2017-05-12 – 2017-05-14 (×5): 10 mg via INTRAVENOUS
  Filled 2017-05-11 (×6): qty 1

## 2017-05-11 MED ORDER — LACTATED RINGERS IV SOLN
INTRAVENOUS | Status: DC
  Administered 2017-05-12: via INTRAVENOUS

## 2017-05-11 MED ORDER — ROCURONIUM BROMIDE 10 MG/ML (PF) SYRINGE
PREFILLED_SYRINGE | INTRAVENOUS | Status: DC | PRN
  Administered 2017-05-11: 100 mg via INTRAVENOUS
  Administered 2017-05-11 (×3): 50 mg via INTRAVENOUS

## 2017-05-11 MED ORDER — MORPHINE SULFATE (PF) 4 MG/ML IV SOLN
1.0000 mg | INTRAVENOUS | Status: DC | PRN
  Administered 2017-05-12: 2 mg via INTRAVENOUS
  Administered 2017-05-12: 4 mg via INTRAVENOUS
  Administered 2017-05-13: 2 mg via INTRAVENOUS
  Filled 2017-05-11 (×2): qty 1

## 2017-05-11 MED ORDER — DOCUSATE SODIUM 100 MG PO CAPS
200.0000 mg | ORAL_CAPSULE | Freq: Every day | ORAL | Status: DC
  Administered 2017-05-12 – 2017-05-14 (×3): 200 mg via ORAL
  Filled 2017-05-11 (×3): qty 2

## 2017-05-11 MED ORDER — MAGNESIUM SULFATE 4 GM/100ML IV SOLN
4.0000 g | Freq: Once | INTRAVENOUS | Status: AC
  Administered 2017-05-11: 4 g via INTRAVENOUS
  Filled 2017-05-11: qty 100

## 2017-05-11 MED ORDER — CARVEDILOL 12.5 MG PO TABS
ORAL_TABLET | ORAL | Status: AC
  Filled 2017-05-11: qty 2

## 2017-05-11 MED ORDER — SODIUM CHLORIDE 0.9 % IJ SOLN
INTRAMUSCULAR | Status: DC | PRN
  Administered 2017-05-11 (×3): 4 mL via TOPICAL

## 2017-05-11 MED ORDER — MORPHINE SULFATE (PF) 4 MG/ML IV SOLN
1.0000 mg | INTRAVENOUS | Status: DC | PRN
  Administered 2017-05-11: 4 mg via INTRAVENOUS
  Administered 2017-05-11: 2 mg via INTRAVENOUS
  Administered 2017-05-12: 4 mg via INTRAVENOUS
  Filled 2017-05-11 (×4): qty 1

## 2017-05-11 MED ORDER — LACTATED RINGERS IV SOLN
INTRAVENOUS | Status: DC | PRN
  Administered 2017-05-11: 07:00:00 via INTRAVENOUS

## 2017-05-11 MED ORDER — FAMOTIDINE IN NACL 20-0.9 MG/50ML-% IV SOLN
20.0000 mg | Freq: Two times a day (BID) | INTRAVENOUS | Status: DC
  Administered 2017-05-11: 20 mg via INTRAVENOUS

## 2017-05-11 MED ORDER — EPHEDRINE 5 MG/ML INJ
INTRAVENOUS | Status: AC
  Filled 2017-05-11: qty 10

## 2017-05-11 MED ORDER — PROTAMINE SULFATE 10 MG/ML IV SOLN
INTRAVENOUS | Status: AC
  Filled 2017-05-11: qty 25

## 2017-05-11 MED ORDER — DEXTROSE 5 % IV SOLN
1.5000 g | Freq: Two times a day (BID) | INTRAVENOUS | Status: AC
  Administered 2017-05-11 – 2017-05-13 (×4): 1.5 g via INTRAVENOUS
  Filled 2017-05-11 (×4): qty 1.5

## 2017-05-11 MED ORDER — CHLORHEXIDINE GLUCONATE 4 % EX LIQD: 30.0000 mL | CUTANEOUS | Status: DC

## 2017-05-11 MED ORDER — CHLORHEXIDINE GLUCONATE 0.12 % MT SOLN
15.0000 mL | OROMUCOSAL | Status: AC
  Administered 2017-05-11: 15 mL via OROMUCOSAL

## 2017-05-11 MED ORDER — SODIUM CHLORIDE 0.9 % IV SOLN: 250.0000 mL | INTRAVENOUS | Status: DC

## 2017-05-11 MED ORDER — SODIUM CHLORIDE 0.9% FLUSH: 3.0000 mL | INTRAVENOUS | Status: DC | PRN

## 2017-05-11 MED ORDER — MUPIROCIN 2 % EX OINT
TOPICAL_OINTMENT | Freq: Two times a day (BID) | CUTANEOUS | Status: DC
  Administered 2017-05-11: 22:00:00 via NASAL
  Administered 2017-05-12: 1 via NASAL
  Administered 2017-05-12: 22:00:00 via NASAL
  Administered 2017-05-13: 1 via NASAL
  Administered 2017-05-13 – 2017-05-16 (×6): via NASAL
  Filled 2017-05-11 (×2): qty 22

## 2017-05-11 MED ORDER — BISACODYL 10 MG RE SUPP: 10.0000 mg | Freq: Every day | RECTAL | Status: DC

## 2017-05-11 MED ORDER — ASPIRIN 81 MG PO CHEW: 324.0000 mg | CHEWABLE_TABLET | Freq: Every day | ORAL | Status: DC

## 2017-05-11 MED ORDER — MIDAZOLAM HCL 2 MG/2ML IJ SOLN: 2.0000 mg | INTRAMUSCULAR | Status: DC | PRN

## 2017-05-11 MED ORDER — EPHEDRINE SULFATE 50 MG/ML IJ SOLN
INTRAMUSCULAR | Status: DC | PRN
  Administered 2017-05-11: 10 mg via INTRAVENOUS

## 2017-05-11 MED ORDER — SODIUM CHLORIDE 0.9 % IV SOLN
0.0000 ug/kg/h | INTRAVENOUS | Status: DC
  Administered 2017-05-11: 0.2 ug/kg/h via INTRAVENOUS
  Filled 2017-05-11 (×2): qty 2

## 2017-05-11 MED ORDER — LACTATED RINGERS IV SOLN: INTRAVENOUS | Status: DC

## 2017-05-11 MED ORDER — LACTATED RINGERS IV SOLN: 500.0000 mL | Freq: Once | INTRAVENOUS | Status: DC | PRN

## 2017-05-11 MED ORDER — ACETAMINOPHEN 160 MG/5ML PO SOLN: 1000.0000 mg | Freq: Four times a day (QID) | ORAL | Status: DC

## 2017-05-11 MED ORDER — ORAL CARE MOUTH RINSE: 15.0000 mL | Freq: Four times a day (QID) | OROMUCOSAL | Status: DC

## 2017-05-11 MED ORDER — PROPOFOL 10 MG/ML IV BOLUS
INTRAVENOUS | Status: AC
  Filled 2017-05-11: qty 20

## 2017-05-11 MED ORDER — DOPAMINE-DEXTROSE 3.2-5 MG/ML-% IV SOLN: 3.0000 ug/kg/min | INTRAVENOUS | Status: DC

## 2017-05-11 MED ORDER — MILRINONE LACTATE IN DEXTROSE 20-5 MG/100ML-% IV SOLN
0.1250 ug/kg/min | INTRAVENOUS | Status: AC
  Administered 2017-05-11: .375 ug/kg/min via INTRAVENOUS
  Filled 2017-05-11: qty 100

## 2017-05-11 MED ORDER — SODIUM CHLORIDE 0.45 % IV SOLN: INTRAVENOUS | Status: DC | PRN

## 2017-05-11 MED ORDER — INSULIN REGULAR BOLUS VIA INFUSION
0.0000 [IU] | Freq: Three times a day (TID) | INTRAVENOUS | Status: DC
  Filled 2017-05-11: qty 10

## 2017-05-11 MED ORDER — OXYCODONE HCL 5 MG PO TABS
5.0000 mg | ORAL_TABLET | ORAL | Status: DC | PRN
  Administered 2017-05-12 – 2017-05-15 (×15): 10 mg via ORAL
  Filled 2017-05-11 (×15): qty 2

## 2017-05-11 MED ORDER — PANTOPRAZOLE SODIUM 40 MG PO TBEC
40.0000 mg | DELAYED_RELEASE_TABLET | Freq: Every day | ORAL | Status: DC
  Administered 2017-05-13 – 2017-05-16 (×4): 40 mg via ORAL
  Filled 2017-05-11 (×4): qty 1

## 2017-05-11 MED ORDER — FENTANYL CITRATE (PF) 250 MCG/5ML IJ SOLN
INTRAMUSCULAR | Status: DC | PRN
  Administered 2017-05-11: 50 ug via INTRAVENOUS
  Administered 2017-05-11: 250 ug via INTRAVENOUS
  Administered 2017-05-11 (×2): 100 ug via INTRAVENOUS
  Administered 2017-05-11: 150 ug via INTRAVENOUS
  Administered 2017-05-11 (×2): 50 ug via INTRAVENOUS
  Administered 2017-05-11: 200 ug via INTRAVENOUS
  Administered 2017-05-11: 100 ug via INTRAVENOUS
  Administered 2017-05-11: 250 ug via INTRAVENOUS

## 2017-05-11 MED ORDER — PROTAMINE SULFATE 10 MG/ML IV SOLN
INTRAVENOUS | Status: AC
  Filled 2017-05-11: qty 5

## 2017-05-11 MED ORDER — MIDAZOLAM HCL 5 MG/5ML IJ SOLN
INTRAMUSCULAR | Status: DC | PRN
  Administered 2017-05-11: 3 mg via INTRAVENOUS
  Administered 2017-05-11: 2 mg via INTRAVENOUS
  Administered 2017-05-11: 3 mg via INTRAVENOUS
  Administered 2017-05-11: 2 mg via INTRAVENOUS

## 2017-05-11 MED ORDER — FENTANYL CITRATE (PF) 250 MCG/5ML IJ SOLN
INTRAMUSCULAR | Status: AC
  Filled 2017-05-11: qty 5

## 2017-05-11 MED ORDER — FENTANYL CITRATE (PF) 250 MCG/5ML IJ SOLN
INTRAMUSCULAR | Status: AC
  Filled 2017-05-11: qty 25

## 2017-05-11 MED ORDER — NITROGLYCERIN IN D5W 200-5 MCG/ML-% IV SOLN
0.0000 ug/min | INTRAVENOUS | Status: DC
  Administered 2017-05-12: 45 ug/min via INTRAVENOUS
  Filled 2017-05-11 (×2): qty 250

## 2017-05-11 MED ORDER — SODIUM CHLORIDE 0.9 % IV SOLN
INTRAVENOUS | Status: DC | PRN
  Administered 2017-05-11: 13:00:00 via INTRAVENOUS

## 2017-05-11 MED ORDER — PROTAMINE SULFATE 10 MG/ML IV SOLN
INTRAVENOUS | Status: DC | PRN
  Administered 2017-05-11: 280 mg via INTRAVENOUS
  Administered 2017-05-11: 10 mg via INTRAVENOUS

## 2017-05-11 MED FILL — Sodium Chloride IV Soln 0.9%: INTRAVENOUS | Qty: 3000 | Status: AC

## 2017-05-11 MED FILL — Electrolyte-R (PH 7.4) Solution: INTRAVENOUS | Qty: 6000 | Status: AC

## 2017-05-11 MED FILL — Lidocaine HCl IV Inj 20 MG/ML: INTRAVENOUS | Qty: 25 | Status: AC

## 2017-05-11 MED FILL — Heparin Sodium (Porcine) Inj 1000 Unit/ML: INTRAMUSCULAR | Qty: 20 | Status: AC

## 2017-05-11 MED FILL — Sodium Bicarbonate IV Soln 8.4%: INTRAVENOUS | Qty: 50 | Status: AC

## 2017-05-11 MED FILL — Mannitol IV Soln 20%: INTRAVENOUS | Qty: 500 | Status: AC

## 2017-05-11 SURGICAL SUPPLY — 91 items
ADAPTER CARDIO PERF ANTE/RETRO (ADAPTER) ×3 IMPLANT
ADPR PRFSN 84XANTGRD RTRGD (ADAPTER) ×2
BLADE STERNUM SYSTEM 6 (BLADE) ×3 IMPLANT
BLADE SURG 11 STRL SS (BLADE) ×4 IMPLANT
CANISTER SUCT 3000ML PPV (MISCELLANEOUS) ×3 IMPLANT
CANNULA AORTIC ROOT 9FR (CANNULA) ×1 IMPLANT
CANNULA EZ GLIDE AORTIC 21FR (CANNULA) ×4 IMPLANT
CANNULA GUNDRY RCSP 15FR (MISCELLANEOUS) ×3 IMPLANT
CANNULA MC2 2 STG 32/40 NON-V (CANNULA) IMPLANT
CANNULA SOFTFLOW AORTIC 7M21FR (CANNULA) ×3 IMPLANT
CANNULA VENOUS 2 STG 32/40 (CANNULA) ×1
CATH CPB KIT OWEN (MISCELLANEOUS) ×3 IMPLANT
CATH HEART VENT LEFT (CATHETERS) ×2 IMPLANT
CATH THORACIC 36FR RT ANG (CATHETERS) ×3 IMPLANT
COVER SURGICAL LIGHT HANDLE (MISCELLANEOUS) ×3 IMPLANT
CRADLE DONUT ADULT HEAD (MISCELLANEOUS) ×3 IMPLANT
DRAIN CHANNEL 32F RND 10.7 FF (WOUND CARE) ×3 IMPLANT
DRAPE BILATERAL SPLIT (DRAPES) IMPLANT
DRAPE CARDIOVASCULAR INCISE (DRAPES) ×3
DRAPE CV SPLIT W-CLR ANES SCRN (DRAPES) IMPLANT
DRAPE INCISE IOBAN 66X45 STRL (DRAPES) ×5 IMPLANT
DRAPE SLUSH/WARMER DISC (DRAPES) ×3 IMPLANT
DRAPE SRG 135X102X78XABS (DRAPES) IMPLANT
DRSG AQUACEL AG ADV 3.5X14 (GAUZE/BANDAGES/DRESSINGS) ×1 IMPLANT
DRSG COVADERM 4X14 (GAUZE/BANDAGES/DRESSINGS) ×3 IMPLANT
ELECT REM PT RETURN 9FT ADLT (ELECTROSURGICAL) ×6
ELECTRODE REM PT RTRN 9FT ADLT (ELECTROSURGICAL) ×4 IMPLANT
FELT TEFLON 1X6 (MISCELLANEOUS) ×6 IMPLANT
GAUZE SPONGE 4X4 12PLY STRL (GAUZE/BANDAGES/DRESSINGS) ×5 IMPLANT
GAUZE SPONGE 4X4 12PLY STRL LF (GAUZE/BANDAGES/DRESSINGS) ×1 IMPLANT
GLOVE BIO SURGEON STRL SZ 6 (GLOVE) IMPLANT
GLOVE BIO SURGEON STRL SZ 6.5 (GLOVE) ×4 IMPLANT
GLOVE BIO SURGEON STRL SZ7 (GLOVE) ×2 IMPLANT
GLOVE BIO SURGEON STRL SZ7.5 (GLOVE) IMPLANT
GLOVE ORTHO TXT STRL SZ7.5 (GLOVE) ×9 IMPLANT
GOWN STRL REUS W/ TWL LRG LVL3 (GOWN DISPOSABLE) ×8 IMPLANT
GOWN STRL REUS W/TWL LRG LVL3 (GOWN DISPOSABLE) ×12
HEMOSTAT POWDER SURGIFOAM 1G (HEMOSTASIS) ×9 IMPLANT
INSERT FOGARTY XLG (MISCELLANEOUS) ×3 IMPLANT
IV CATH 18G X1.75 CATHLON (IV SOLUTION) ×1 IMPLANT
KIT BASIN OR (CUSTOM PROCEDURE TRAY) ×3 IMPLANT
KIT DRAINAGE VACCUM ASSIST (KITS) ×1 IMPLANT
KIT ROOM TURNOVER OR (KITS) ×3 IMPLANT
KIT SUCTION CATH 14FR (SUCTIONS) ×9 IMPLANT
LEAD PACING MYOCARDI (MISCELLANEOUS) ×3 IMPLANT
LINE VENT (MISCELLANEOUS) ×1 IMPLANT
NS IRRIG 1000ML POUR BTL (IV SOLUTION) ×15 IMPLANT
PACK OPEN HEART (CUSTOM PROCEDURE TRAY) ×3 IMPLANT
PAD ARMBOARD 7.5X6 YLW CONV (MISCELLANEOUS) ×6 IMPLANT
RING ANLPLS HAART 300X19 (Ring) IMPLANT
RING HAART ANNULOPLASTY 300-19 (Ring) ×3 IMPLANT
SET CARDIOPLEGIA MPS 5001102 (MISCELLANEOUS) ×1 IMPLANT
SET IRRIG TUBING LAPAROSCOPIC (IRRIGATION / IRRIGATOR) ×2 IMPLANT
SUT BONE WAX W31G (SUTURE) ×3 IMPLANT
SUT ETHIBON 2 0 V 52N 30 (SUTURE) ×6 IMPLANT
SUT ETHIBON EXCEL 2-0 V-5 (SUTURE) IMPLANT
SUT ETHIBOND 2 0 SH (SUTURE)
SUT ETHIBOND 2 0 SH 36X2 (SUTURE) IMPLANT
SUT ETHIBOND 2 0 V4 (SUTURE) IMPLANT
SUT ETHIBOND 2 0V4 GREEN (SUTURE) IMPLANT
SUT ETHIBOND 4 0 RB 1 (SUTURE) IMPLANT
SUT ETHIBOND V-5 VALVE (SUTURE) IMPLANT
SUT ETHIBOND X763 2 0 SH 1 (SUTURE) ×9 IMPLANT
SUT MNCRL AB 3-0 PS2 18 (SUTURE) ×6 IMPLANT
SUT PDS AB 1 CTX 36 (SUTURE) ×6 IMPLANT
SUT PROLENE 3 0 SH DA (SUTURE) ×2 IMPLANT
SUT PROLENE 3 0 SH1 36 (SUTURE) ×6 IMPLANT
SUT PROLENE 4 0 RB 1 (SUTURE) ×48
SUT PROLENE 4 0 SH DA (SUTURE) ×3 IMPLANT
SUT PROLENE 4-0 RB1 .5 CRCL 36 (SUTURE) ×4 IMPLANT
SUT PROLENE 5 0 C 1 36 (SUTURE) ×2 IMPLANT
SUT PROLENE 6 0 C 1 30 (SUTURE) IMPLANT
SUT SILK  1 MH (SUTURE) ×3
SUT SILK 1 MH (SUTURE) ×2 IMPLANT
SUT SILK 2 0 SH CR/8 (SUTURE) IMPLANT
SUT SILK 3 0 SH CR/8 (SUTURE) IMPLANT
SUT STEEL 6MS V (SUTURE) IMPLANT
SUT VIC AB 2-0 CTX 27 (SUTURE) IMPLANT
SUTURE E-PAK OPEN HEART (SUTURE) ×3 IMPLANT
SYSTEM SAHARA CHEST DRAIN ATS (WOUND CARE) ×3 IMPLANT
TAPE CLOTH SURG 4X10 WHT LF (GAUZE/BANDAGES/DRESSINGS) ×1 IMPLANT
TAPE PAPER 3X10 WHT MICROPORE (GAUZE/BANDAGES/DRESSINGS) ×1 IMPLANT
TOWEL GREEN STERILE (TOWEL DISPOSABLE) ×12 IMPLANT
TOWEL GREEN STERILE FF (TOWEL DISPOSABLE) ×6 IMPLANT
TOWEL OR 17X24 6PK STRL BLUE (TOWEL DISPOSABLE) ×6 IMPLANT
TOWEL OR 17X26 10 PK STRL BLUE (TOWEL DISPOSABLE) ×6 IMPLANT
TRAY FOLEY SILVER 16FR TEMP (SET/KITS/TRAYS/PACK) ×3 IMPLANT
TUBE SUCT INTRACARD DLP 20F (MISCELLANEOUS) ×1 IMPLANT
UNDERPAD 30X30 (UNDERPADS AND DIAPERS) ×3 IMPLANT
VENT LEFT HEART 12002 (CATHETERS) ×3
WATER STERILE IRR 1000ML POUR (IV SOLUTION) ×6 IMPLANT

## 2017-05-11 NOTE — Plan of Care (Signed)
Problem: Activity: Goal: Risk for activity intolerance will decrease Outcome: Progressing Pt dangled at bedside without difficulty.  Problem: Respiratory: Goal: Ability to tolerate decreased levels of ventilator support will improve Outcome: Completed/Met Date Met: 05/11/17 Pt extubated within six hours.  Problem: Pain Management: Goal: Pain level will decrease Outcome: Progressing Patients pain is controlled well with prescribed medications.   Problem: Skin Integrity: Goal: Risk for impaired skin integrity will decrease Outcome: Progressing Pt arrived to unit with sacral foam clean, dry, and intact.  Problem: Urinary Elimination: Goal: Ability to achieve and maintain adequate renal perfusion and functioning will improve Outcome: Progressing Patient has maintained adequate UOP in the immediate post operative period.

## 2017-05-11 NOTE — Op Note (Signed)
CARDIOTHORACIC SURGERY OPERATIVE NOTE  Date of Procedure:  05/11/2017  Preoperative Diagnosis: Severe Aortic Insufficiency  Postoperative Diagnosis: Same   Procedure:    Aortic Valve Repair   Complex valvuloplasty including plication of left coronary leaflet             Biostable HAART aortic ring annuloplasty (size 19 mm, ref # [id number], lot # [id number])    Surgeon: Valentina Gu. Roxy Manns, MD  Assistant: Gaye Pollack, MD  Anesthesia: Laurie Panda, MD  Operative Findings:  Isolated prolapse of left coronary leaflet  Severe aortic insufficiency  Moderate-severe left ventricular systolic dysfunction  Trivial residual aortic insufficiency after successful valve repair              BRIEF CLINICAL NOTE AND INDICATIONS FOR SURGERY  Patient is a 36 year old African-American male with history of aortic insufficiency, nonischemic cardiomyopathy with chronic combined systolic and diastolic congestive heart failure, long-standing hypertension with hypertensive heart disease, end-stage 3 chronic kidney disease who has been referred for surgical consultation to discuss treatment options for management of stage D severe symptomatic aortic insufficiency. Patient states that he was first diagnosed with congestive heart failure in 2012.  He was diagnosed with nonischemic cardiomyopathy and hypertension. Left ventricular ejection fraction reportedly improved and heart failure medications were ultimately stopped. The following year he presented with a recurrence of acute systolic congestive heart failure. Ejection fraction had dropped again to 20%. He improved with medical therapy. In 2016 he was hospitalized a third time with hypertensive urgency, elevated troponin levels and congestive heart failure after the patient had run out of his medications. He has been followed for the last several years by Dr. Haroldine Laws in the advanced heart failure clinic. He has remained stable from a medical  standpoint with mild symptoms of exertional shortness of breath. However, every time he cut back on diuretics or other medications he develops fairly rapid onset symptoms of exertional shortness of breath and fluid retention.  He was seen in follow-up recently by Dr. Haroldine Laws and repeat echocardiogram revealed ejection fraction 40% with severe left ventricular hypertrophy and at least moderate aortic insufficiency with mild to moderate left ventricular chamber enlargement. Transesophageal echocardiogram was performed to more carefully evaluate the severity of aortic insufficiency on 04/09/2017. This revealed severe aortic insufficiency. The aortic valve was trileaflet but there was incomplete coaptation of the right coronary noncoronary leaflets. There was no evidence of vegetation no other signs to suggest a history of endocarditis. There was diffuse left ventricular hypokinesis with ejection fraction estimated 35%.  Left and right heart catheterization was performed 04/20/2017. The patient was noted to have normal coronary arteries with no significant coronary artery disease. There was severe aortic insufficiency and compensated filling pressures with PA pressures measured 31/17. Cardiac output was 4.8 L/m corresponding to a cardiac index of 2.2. Mixed venous oxygen saturation 69% and central venous pressure 4 mmHg. The patient was referred for possible elective aortic valve repair.  The patient has been seen in consultation and counseled at length regarding the indications, risks and potential benefits of surgery.  All questions have been answered, and the patient provides full informed consent for the operation as described.    DETAILS OF THE OPERATIVE PROCEDURE  Preparation:  The patient is brought to the operating room on the above mentioned date and central monitoring was established by the anesthesia team including placement of Swan-Ganz catheter and radial arterial line. At baseline the patient had  moderate pulmonary hypertension. The patient is placed in the  supine position on the operating table.  Intravenous antibiotics are administered. General endotracheal anesthesia is induced uneventfully. A Foley catheter is placed.  Baseline transesophageal echocardiogram was performed.  Findings were notable for moderate to severe global left ventricular systolic dysfunction. There was moderate left ventricular hypertrophy. Left ventricular ejection fraction was estimated 30-35%.  There was severe aortic insufficiency. There appeared to be isolated prolapse involving the left coronary leaflet of the aortic valve.  In addition, the left coronary leaflet appeared somewhat smaller than the normal sized right coronary leaflet and noncoronary leaflet. The jet of aortic insufficiency was very eccentric and directed primarily over the ventricular surface of the anterior leaflet of the mitral valve. There was trace mitral regurgitation and trace tricuspid regurgitation. Right heart size and function appeared normal.  The patient's chest, abdomen, both groins, and both lower extremities are prepared and draped in a sterile manner. A time out procedure is performed.   Surgical Approach:  A median sternotomy incision was performed and the pericardium is opened. The ascending aorta is normal in appearance.    Extracorporeal Cardiopulmonary Bypass and Myocardial Protection:  The ascending aorta and the right atrium are cannulated for cardiopulmonary bypass.  Adequate heparinization is verified.   A retrograde cardioplegia cannula is placed through the right atrium into the coronary sinus.  The operative field was continuously flooded with carbon dioxide gas.  The entire pre-bypass portion of the operation was notable for stable hemodynamics.  Cardiopulmonary bypass was begun and the surface of the heart is inspected.  A left ventricular vent is placed through the right superior pulmonary vein.A cardioplegia  cannula is placed in the ascending aorta.  A temperature probe was placed in the interventricular septum.  The patient is cooled to 32C systemic temperature.  The aortic cross clamp is applied and cardioplegia is delivered initially in an antegrade fashion through the aortic root using modified [place] cold blood cardioplegia ([place] blood cardioplegia protocol).   The initial cardioplegic arrest is rapid with early diastolic arrest.  Approximately 500 mL of the initial arresting dose was administered antegrade and the remainder administered retrograde to the coronary sinus catheter.  Myocardial protection was felt to be excellent.   Aortic Valve Repair:  The aorta was dissected away from the pulmonary artery and the proximal aortic root exposed.  A transverse aortotomy incision was made approximately 1 cm above the origin of the right coronary artery. The aortic valve was inspected. The aortic valve was trileaflet. There was isolated prolapse involving the left coronary leaflet. The left coronary leaflet was obviously fractured in the midportion. The left coronary leaflet was also somewhat smaller than the right coronary leaflet and the noncoronary leaflet. There was mild nodular fibrosis without nodular retraction of both the right coronary leaflet and the noncoronary leaflet.  Traction sutures are placed just above each of the 3 commissures and spread laterally to suspend the valve symmetrically.  Additional traction sutures are placed through the aortic wall above the midportion of each of the sinuses of Valsalva.   The valve is again carefully inspected. The aortic annulus is sized with a Hegar dilator and found to be approximately 25 mm, consistent with the preoperative echo.  Each of the valve leaflets are sized using specially developed sizers for the Biostable annuloplasty ring to measure the length of the free margin of each leaflet.   A decision is made to proceed with a 19 mm  annuloplasty ring.  Biostable HAART 300 aortic ring annuloplasty (size 19  mm, ref # [id number], lot # [id number]) was implanted per manufacturer recommendations. Initially pledgeted Cabrol type pledgeted 4-0 Prolene sutures are placed beneath each of the commissures and through the posts of the ring. The the ring is subsequently lowered through the valve into the sub-annular space. A total of 4 additional sutures are then placed at the base of each of the 3 sinuses of Valsalva to secure the ring beneath the valve.  A single sutures placed at the base of the left and the right sinuses of Valsalva whereas 2 individual sutures are placed at the base of the larger non-coronary sinus of Valsalva.  All 4 sutures are individually tied and lateral fixation is utilized to pull each of the sutures away from the valve leaflets.  The valve is again carefully annualized and appears nearly competent. The left coronary prolapse is corrected using a pair of 7-0 Prolene plication sutures on either side of the Nodulus of Arantis.  Rewarming was begun.  The aortotomy was closed  using a 2-layer closure of running 4-0 Prolene suture.  One final dose of warm retrograde "reanimation" cardioplegia was administered retrograde through the coronary sinus catheter while all air was evacuated through the aortic root.  The aortic cross clamp was removed after a cross clamp time of 84 minutes.  The patient returned to sinus rhythm spontaneously. Prior to removal of any of the cannulas, the patient was weaned from cardiopulmonary bypass and valve function carefully examined using transesophageal echocardiogram.  The aortic valve appears to be functioning normally with trace residual central aortic insufficiency. Mean transvalvular gradient across the valve was estimated 7 mmHg.  At this juncture there is notably bleeding appreciated from the medial portion of the aortotomy closure. Several interrupted pledgeted Prolene sutures are  placed but the bleeding gets worse due to some tearing of the sutures through the fragile aortic wall.  Cardiopulmonary bypass was resumed. The aortic cross-clamp was replaced and cold blood cardioplegia is administered through the aortic root.  Several interrupted pledgeted Prolene sutures are utilized to repair the area of bleeding along the medial aspect of the aortotomy closure.  The aortic cross clamp was removed after a second cross clamp time of 20 minutes for a grand total cross clamp time of 104 minutes.   Procedure Completion:  Epicardial pacing wires are fixed to the right ventricular outflow tract and to the right atrial appendage. The patient is rewarmed to 37C temperature. The aortic and left ventricular vents are removed.  The patient is weaned and disconnected from cardiopulmonary bypass.  The patient's rhythm at separation from bypass was atrial paced.  The patient was weaned from cardiopulmonary bypass on low dose milrinone and dopamine infusions. Total cardiopulmonary bypass time for the operation was 166 minutes.  Followup transesophageal echocardiogram performed after separation from bypass revealed an aortic valve that was functioning normally and with trivial residual central aortic insufficiency.  Mean transvalvular gradient across the aortic valve was estimated between 7 and 9 mmHg.  Left ventricular function was unchanged from preoperatively.  The aortic and venous cannula were removed uneventfully. Protamine was administered to reverse the anticoagulation. The mediastinum was inspected for hemostasis and irrigated with saline solution. The mediastinum was drained using 2 chest tubes placed through separate stab incisions inferiorly.  The soft tissues anterior to the aorta were reapproximated loosely. The sternum is closed with double strength sternal wire. The soft tissues anterior to the sternum were closed in multiple layers and the skin is closed with a running  subcuticular skin closure.  The post-bypass portion of the operation was notable for stable rhythm and hemodynamics.  No blood products were administered during the operation.   Disposition:  The patient tolerated the procedure well and is transported to the surgical intensive care in stable condition. There are no intraoperative complications. All sponge instrument and needle counts are verified correct at completion of the operation.    Valentina Gu. Roxy Manns MD 05/11/2017 1:31 PM

## 2017-05-11 NOTE — Procedures (Signed)
Extubation Procedure Note  Patient Details:   Name: [name] [name] DOB: 02-28-1981 MRN: [id number]   Airway Documentation:     Evaluation  O2 sats: stable throughout Complications: No apparent complications Patient did tolerate procedure well. Bilateral Breath Sounds: Rhonchi   Yes   Patient tolerated rapid wean. VC 1.1L and NIF -26. Positive for cuff leak. Patient extubated to a 4 Lpm nasal cannula. No signs of dyspnea or stridor. Patient achieved around 850 mL five times on the [employer]. RN and family at bedside.  Myrtie Neither 05/11/2017, 6:44 PM

## 2017-05-11 NOTE — Progress Notes (Signed)
Echocardiogram Echocardiogram Transesophageal has been performed.  Matilde Bash 05/11/2017, 9:38 AM

## 2017-05-11 NOTE — H&P (View-Only) (Signed)
[address]       [place] [address]             [facility] REPORT  Referring Provider is Bensimhon, Shaune Pascal, MD PCP is Sharilyn Sites, MD  Chief Complaint  Patient presents with  . New Patient (Initial Visit)    Aortic valve repair    HPI:  Patient is a 36 year old African-American male with history of aortic insufficiency, nonischemic cardiomyopathy with chronic combined systolic and diastolic congestive heart failure, long-standing hypertension with hypertensive heart disease, end-stage 3 chronic kidney disease who has been referred for surgical consultation to discuss treatment options for management of stage D severe symptomatic aortic insufficiency. Patient states that he was first diagnosed with congestive heart failure in 2012.  He was diagnosed with nonischemic cardiomyopathy and hypertension. Left ventricular ejection fraction reportedly improved and heart failure medications were ultimately stopped. The following year he presented with a recurrence of acute systolic congestive heart failure. Ejection fraction had dropped again to 20%. He improved with medical therapy. In 2016 he was hospitalized a third time with hypertensive urgency, elevated troponin levels and congestive heart failure after the patient had run out of his medications. He has been followed for the last several years by Dr. Haroldine Laws in the advanced heart failure clinic. He has remained stable from a medical standpoint with mild symptoms of exertional shortness of breath. However, every time he cut back on diuretics or other medications he develops fairly rapid onset symptoms of exertional shortness of breath and fluid retention.  He was seen in follow-up recently by Dr. Haroldine Laws and repeat echocardiogram revealed ejection fraction 40% with severe left ventricular hypertrophy and at least moderate aortic insufficiency with mild to moderate left  ventricular chamber enlargement. Transesophageal echocardiogram was performed to more carefully evaluate the severity of aortic insufficiency on 04/09/2017. This revealed severe aortic insufficiency. The aortic valve was trileaflet but there was incomplete coaptation of the right coronary noncoronary leaflets. There was no evidence of vegetation no other signs to suggest a history of endocarditis. There was diffuse left ventricular hypokinesis with ejection fraction estimated 35%.  Left and right heart catheterization was performed 04/20/2017. The patient was noted to have normal coronary arteries with no significant coronary artery disease. There was severe aortic insufficiency and compensated filling pressures with PA pressures measured 31/17. Cardiac output was 4.8 L/m corresponding to a cardiac index of 2.2. Mixed venous oxygen saturation 69% and central venous pressure 4 mmHg. The patient was referred for possible elective aortic valve repair.  The patient just recently got married and lives in [place] with his wife. His father lives close by and accompanied the patient to his office visit today. The patient works full-time for [employer] in [place], [place]. His job requires some strenuous physical activity although he is currently on light duty because of a fracture in his right wrist.  He states that recently he has been fairly stable from a cardiac standpoint. He gets short of breath with more strenuous physical exertion but this does not limit him to much. He does not exercise on a regular basis. He has had some occasional vague tightness across his chest both with and without physical activity. He reports occasional palpitations. If he cuts back or stop taking diuretics he gets short of breath within a few days. He monitors his weight carefully and has been compliant with medical therapy for a long  time now.  Past Medical History:  Diagnosis Date  . CHF (congestive heart failure)  ([place])   . Chronic systolic heart failure ([place])   . CKD (chronic kidney disease) stage 2, GFR 60-89 ml/min   . Essential hypertension, benign   . Headache   . History of pneumonia   . Mitral regurgitation    Moderate  . Noncompliance   . Nonischemic cardiomyopathy ([place])    Normal coronaries May 2012, LVEF < 20%    Past Surgical History:  Procedure Laterality Date  . RIGHT/LEFT HEART CATH AND CORONARY ANGIOGRAPHY N/A 04/20/2017   Procedure: RIGHT/LEFT HEART CATH AND CORONARY ANGIOGRAPHY;  Surgeon: Jolaine Artist, MD;  Location: [facility];  Service: Cardiovascular;  Laterality: N/A;  . SURGERY SCROTAL / TESTICULAR     Testicular torsion  . TEE WITHOUT CARDIOVERSION N/A 04/09/2017   Procedure: TRANSESOPHAGEAL ECHOCARDIOGRAM (TEE);  Surgeon: Jolaine Artist, MD;  Location: [facility] ENDOSCOPY;  Service: Cardiovascular;  Laterality: N/A;    Family History  Problem Relation Age of Onset  . Breast cancer Mother   . Stroke Father     Social History   Social History  . Marital status: Married    Spouse name: N/A  . Number of children: 0  . Years of education: N/A   Occupational History  . Not on file.   Social History Main Topics  . Smoking status: Never Smoker  . Smokeless tobacco: Never Used  . Alcohol use 0.0 oz/week     Comment: Occasionally  . Drug use: No  . Sexual activity: Yes    Birth control/ protection: Condom   Other Topics Concern  . Not on file   Social History Narrative   Occupation: just started sanitation job cleaning    Current Outpatient Prescriptions  Medication Sig Dispense Refill  . acetaminophen (TYLENOL) 500 MG tablet Take 500 mg by mouth every 6 (six) hours as needed.    . carvedilol (COREG) 25 MG tablet Take 1 tablet (25 mg total) by mouth 2 (two) times daily with a meal. 60 tablet 6  . furosemide (LASIX) 40 MG tablet Take 2 tablets (80 mg total) by mouth daily. (Patient taking differently: Take 40 mg by mouth 2 (two) times daily. )  60 tablet 2  . hydrALAZINE (APRESOLINE) 25 MG tablet Take 1 tablet (25 mg total) by mouth 3 (three) times daily. 90 tablet 3  . sacubitril-valsartan (ENTRESTO) 97-103 MG Take 1 tablet by mouth 2 (two) times daily. 60 tablet 3   No current facility-administered medications for this visit.     No Known Allergies    Review of Systems:   General:  normal appetite, stable energy, + weight gain, no weight loss, no fever  Cardiac:  no chest pain with exertion, no chest pain at rest, + SOB with exertion, no resting SOB, no PND, no orthopnea, + palpitations, no arrhythmia, no atrial fibrillation, no LE edema, no dizzy spells, no syncope  Respiratory:  + exertional shortness of breath, no home oxygen, no productive cough, no dry cough, no bronchitis, no wheezing, no hemoptysis, no asthma, no pain with inspiration or cough, no sleep apnea, no CPAP at night  GI:   no difficulty swallowing, no reflux, no frequent heartburn, no hiatal hernia, no abdominal pain, no constipation, no diarrhea, no hematochezia, no hematemesis, no melena  GU:   no dysuria,  no frequency, no urinary tract infection, no hematuria, no enlarged prostate, no kidney stones, + chronic kidney disease  Vascular:  no pain suggestive of claudication, no pain in feet, no leg cramps, no varicose veins, no DVT, no non-healing foot ulcer  Neuro:   no stroke, no TIA's, no seizures, no headaches, no temporary blindness one eye,  no slurred speech, no peripheral neuropathy, no chronic pain, no instability of gait, no memory/cognitive dysfunction  Musculoskeletal: no arthritis, + joint swelling right wrist, no myalgias, no difficulty walking, normal mobility   Skin:   no rash, no itching, no skin infections, no pressure sores or ulcerations  Psych:   no anxiety, no depression, no nervousness, no unusual recent stress  Eyes:   no blurry vision, no floaters, no recent vision changes, no wears glasses or contacts  ENT:   no hearing loss, no loose or  painful teeth, no dentures, last saw dentist June 2018  Hematologic:  no easy bruising, no abnormal bleeding, no clotting disorder, no frequent epistaxis  Endocrine:  no diabetes, does not check CBG's at home     Physical Exam:   BP 138/84   Pulse 74   Ht 5\' 10"  (1.778 m)   Wt 229 lb (103.9 kg)   SpO2 97%   BMI 32.86 kg/m   General:  Moderately obese AA male,  well-appearing  HEENT:  Unremarkable   Neck:   no JVD, no bruits, no adenopathy   Chest:   clear to auscultation, symmetrical breath sounds, no wheezes, no rhonchi   CV:   RRR, grade III/VI holodiastolic murmur best along LLSB  Abdomen:  soft, non-tender, no masses   Extremities:  warm, well-perfused, pulses bounding, no LE edema  Rectal/GU  Deferred  Neuro:   Grossly non-focal and symmetrical throughout  Skin:   Clean and dry, no rashes, no breakdown   Diagnostic Tests:  Transthoracic Echocardiography  (Report amended )  Patient:    Famous, Eisenhardt MR #:       [id number] Study Date: 04/01/2017 Gender:     M Age:        35 Height:     177.8 cm Weight:     95.3 kg BSA:        2.19 m^2 Pt. Status: Room:   ATTENDING    Default, Provider [phone]  Normajean Baxter, [place]  ORDERING     Bensimhon, Daniel  REFERRING    Bensimhon, [place], Inpatient  SONOGRAPHER  Cardell Peach, RDCS  cc:  ------------------------------------------------------------------- LV EF: 45% -   50%  ------------------------------------------------------------------- Indications:      CHF - 428.0.  ------------------------------------------------------------------- Study Conclusions  - Left ventricle: The cavity size was mildly dilated. There was   moderate concentric hypertrophy. Systolic function was mildly   reduced. The estimated ejection fraction was in the range of 45%   to 50%. Wall motion was normal; there were no regional wall   motion abnormalities. Features are consistent with a  pseudonormal   left ventricular filling pattern, with concomitant abnormal   relaxation and increased filling pressure (grade 2 diastolic   dysfunction). Doppler parameters are consistent with elevated   ventricular end-diastolic filling pressure. - Aortic valve: There was severe regurgitation. - Mitral valve: There was mild regurgitation. - Left atrium: The atrium was mildly dilated. - Right ventricle: The cavity size was normal. Wall thickness was   normal. Systolic function was normal. - Right atrium: The atrium was normal in size. - Tricuspid valve: There was trivial regurgitation. - Pulmonic valve: There was mild regurgitation. - Pulmonary  arteries: Systolic pressure was within the normal   range. - Inferior vena cava: The vessel was normal in size. - Pericardium, extracardiac: There was no pericardial effusion.  Impressions:  - Mild left ventricular dilatation with mild systolic dysfunction   LVEF 45-50%. There is mild diffuse hypokinesis and basal and mid   inferoseptal and inferior walls.   Aortic regurgitation is now severe.  ------------------------------------------------------------------- Study data:  Comparison was made to the study of 05/22/2016.  Study status:  Routine.  Procedure:  Transthoracic echocardiography. Image quality was adequate.  Study completion:  There were no complications.          Transthoracic echocardiography.  M-mode, complete 2D, spectral Doppler, and color Doppler.  Birthdate: Patient birthdate: 1980-12-16.  Age:  Patient is 36 yr old.  Sex: Gender: male.    BMI: 30.1 kg/m^2.  Blood pressure:     126/76 Patient status:  Outpatient.  Study date:  Study date: 04/01/2017. Study time: 11:09 AM.  Location:  Bedside.  -------------------------------------------------------------------  ------------------------------------------------------------------- Left ventricle:  The cavity size was mildly dilated. There was moderate concentric  hypertrophy. Systolic function was mildly reduced. The estimated ejection fraction was in the range of 45% to 50%. Wall motion was normal; there were no regional wall motion abnormalities. Features are consistent with a pseudonormal left ventricular filling pattern, with concomitant abnormal relaxation and increased filling pressure (grade 2 diastolic dysfunction). Doppler parameters are consistent with elevated ventricular end-diastolic filling pressure.  ------------------------------------------------------------------- Aortic valve:   Trileaflet; normal thickness leaflets. Mobility was not restricted.  Doppler:  Transvalvular velocity was within the normal range. There was no stenosis. There was severe regurgitation.  ------------------------------------------------------------------- Aorta:  Aortic root: The aortic root was normal in size.  ------------------------------------------------------------------- Mitral valve:   Structurally normal valve.   Mobility was not restricted.  Doppler:  Transvalvular velocity was within the normal range. There was no evidence for stenosis. There was mild regurgitation.    Peak gradient (D): 2 mm Hg.  ------------------------------------------------------------------- Left atrium:  The atrium was mildly dilated.  ------------------------------------------------------------------- Right ventricle:  The cavity size was normal. Wall thickness was normal. Systolic function was normal.  ------------------------------------------------------------------- Pulmonic valve:    Structurally normal valve.   Cusp separation was normal.  Doppler:  Transvalvular velocity was within the normal range. There was no evidence for stenosis. There was mild regurgitation.  ------------------------------------------------------------------- Tricuspid valve:   Structurally normal valve.    Doppler: Transvalvular velocity was within the normal range.  There was trivial regurgitation.  ------------------------------------------------------------------- Pulmonary artery:   The main pulmonary artery was normal-sized. Systolic pressure was within the normal range.  ------------------------------------------------------------------- Right atrium:  The atrium was normal in size.  ------------------------------------------------------------------- Pericardium:  There was no pericardial effusion.  ------------------------------------------------------------------- Systemic veins: Inferior vena cava: The vessel was normal in size.  ------------------------------------------------------------------- Measurements   Left ventricle                          Value        Reference  LV ID, ED, PLAX chordal          (H)    64.7  mm     43 - 52  LV ID, ES, PLAX chordal          (H)    44.3  mm     23 - 38  LV fx shortening, PLAX chordal          32    %      >=  29  LV PW thickness, ED                     12.7  mm     ----------  IVS/LV PW ratio, ED                     1            <=1.3  Stroke volume, 2D                       51    ml     ----------  Stroke volume/bsa, 2D                   23    ml/m^2 ----------  LV e&', lateral                          4.03  cm/s   ----------  LV E/e&', lateral                        18.09        ----------  LV e&', medial                           4.68  cm/s   ----------  LV E/e&', medial                         15.58        ----------  LV e&', average                          4.36  cm/s   ----------  LV E/e&', average                        16.74        ----------  Longitudinal strain, TDI                12    %      ----------    Ventricular septum                      Value        Reference  IVS thickness, ED                       12.7  mm     ----------    LVOT                                    Value        Reference  LVOT ID, S                              21    mm     ----------  LVOT area                                3.46  cm^2   ----------  LVOT peak velocity, S  87.7  cm/s   ----------  LVOT mean velocity, S                   60    cm/s   ----------  LVOT VTI, S                             14.8  cm     ----------  LVOT peak gradient, S                   3     mm Hg  ----------    Aortic valve                            Value        Reference  Aortic regurg pressure half-time        391   ms     ----------    Aorta                                   Value        Reference  Aortic root ID, ED                      34    mm     ----------    Left atrium                             Value        Reference  LA ID, A-P, ES                          43    mm     ----------  LA ID/bsa, A-P                          1.96  cm/m^2 <=2.2  LA volume, S                            70.5  ml     ----------  LA volume/bsa, S                        32.1  ml/m^2 ----------  LA volume, ES, 1-p A4C                  59.2  ml     ----------  LA volume/bsa, ES, 1-p A4C              27    ml/m^2 ----------  LA volume, ES, 1-p A2C                  79.5  ml     ----------  LA volume/bsa, ES, 1-p A2C              36.2  ml/m^2 ----------    Mitral valve                            Value        Reference  Mitral E-wave peak velocity  72.9  cm/s   ----------  Mitral A-wave peak velocity             54.5  cm/s   ----------  Mitral deceleration time                176   ms     150 - 230  Mitral peak gradient, D                 2     mm Hg  ----------  Mitral E/A ratio, peak                  1.3          ----------    Right atrium                            Value        Reference  RA ID, S-I, ES, A4C              (H)    63.6  mm     34 - 49  RA area, ES, A4C                        17    cm^2   8.3 - 19.5  RA volume, ES, A/L                      38.9  ml     ----------  RA volume/bsa, ES, A/L                  17.7  ml/m^2 ----------    Systemic veins                           Value        Reference  Estimated CVP                           3     mm Hg  ----------    Right ventricle                         Value        Reference  RV ID, minor axis, ED, A4C base         35.7  mm     ----------  TAPSE                                   19.1  mm     ----------  RV s&', lateral, S                       13.8  cm/s   ----------    Pulmonic valve                          Value        Reference  Pulmonic regurg velocity, ED            117   cm/s   ----------  Pulmonic regurg gradient, ED            5     mm Hg  ----------  Legend: (  L)  and  (H)  mark values outside specified reference range.  ------------------------------------------------------------------- Lance Morin, M.D. 2018-09-20T14:06:34   Transesophageal Echocardiography  Patient:    Curry, Seefeldt MR #:       [id number] Study Date: 04/09/2017 Gender:     M Age:        35 Height:     177.8 cm Weight:     103.9 kg BSA:        2.3 m^2 Pt. Status: Room:   ATTENDING    Pierre Bali, MD  PERFORMING   Pierre Bali, MD  SONOGRAPHER  Johny Chess, RDCS, CCT  ADMITTING    Bensimhon, Lanae Crumbly     Bensimhon, Daniel  cc:  ------------------------------------------------------------------- LV EF: 35%  ------------------------------------------------------------------- Indications:      Aortic insufficiency 424.1.  ------------------------------------------------------------------- Study Conclusions  - Left ventricle: The estimated ejection fraction was 35%. Diffuse   hypokinesis. - Aortic valve: There is incomplete coaptation of the RCC and NCC   with severe AI. No evidence of valve destrcution or other   abnormality. - Mitral valve: No evidence of vegetation. - Left atrium: The atrium was dilated. - Right atrium: No evidence of thrombus in the atrial cavity or   appendage. - Tricuspid valve: No evidence of vegetation. - Pulmonic valve: No evidence  of vegetation.  ------------------------------------------------------------------- Study data:   Study status:  Routine.  Consent:  The risks, benefits, and alternatives to the procedure were explained to the patient and informed consent was obtained.  Procedure:  Initial setup. The patient was brought to the laboratory. Surface ECG leads were monitored. Sedation. Conscious sedation was administered by cardiology staff. Transesophageal echocardiography. An adult multiplane transesophageal probe was inserted by the attending cardiologistwithout difficulty. Image quality was adequate.  Study completion:  The patient tolerated the procedure well. There were no complications.  Administered medications:   Midazolam, 5mg , IV. Fentanyl, 69mcg, IV.          Diagnostic transesophageal echocardiography.  2D and color Doppler.  Birthdate:  Patient birthdate: 03-12-1981.  Age:  Patient is 36 yr old.  Sex:  Gender: male.    BMI: 32.9 kg/m^2.  Blood pressure:     164/83  Patient status:  Outpatient.  Study date:  Study date: 04/09/2017. Study time: 11:14 AM.  Location:  Endoscopy.  -------------------------------------------------------------------  ------------------------------------------------------------------- Left ventricle:  The estimated ejection fraction was 35%. Diffuse hypokinesis.  ------------------------------------------------------------------- Aortic valve:   Trileaflet. There is incomplete coaptation of the RCC and NCC with severe AI. No evidence of valve destrcution or other abnormality.  Doppler:   There was no stenosis.  ------------------------------------------------------------------- Aorta:  The aorta was normal, not dilated, and non-diseased.  ------------------------------------------------------------------- Mitral valve:   Structurally normal valve.   Leaflet separation was normal.  No evidence of vegetation.  Doppler:  There was  no regurgitation.  ------------------------------------------------------------------- Left atrium:  The atrium was dilated.  ------------------------------------------------------------------- Right ventricle:  The cavity size was normal. Wall thickness was normal. Systolic function was normal.  ------------------------------------------------------------------- Pulmonic valve:    Structurally normal valve.   Cusp separation was normal.  No evidence of vegetation.  ------------------------------------------------------------------- Tricuspid valve:   Structurally normal valve.   Leaflet separation was normal.  No evidence of vegetation.  Doppler:  There was trivial regurgitation.  ------------------------------------------------------------------- Right atrium:  The atrium was normal in size.  No evidence of thrombus in the atrial cavity or appendage.  ------------------------------------------------------------------- Pericardium:  There was no pericardial effusion.   ------------------------------------------------------------------- Post  procedure conclusions Ascending Aorta:  - The aorta was normal, not dilated, and non-diseased.  ------------------------------------------------------------------- Measurements   Aortic valve                          Value  Aortic regurg pressure half-time      171   ms  Legend: (L)  and  (H)  mark values outside specified reference range.  ------------------------------------------------------------------- Prepared and Electronically Authenticated by  Pierre Bali, MD 2018-10-01T13:57:11    RIGHT/LEFT HEART CATH AND CORONARY ANGIOGRAPHY  Conclusion   Findings:  Ao = 125/75 (97) LV = 96/10 RA = 4 RV = 32/7 PA = 31/17 (23) PCW = 16 Fick cardiac output/index = 4.8/2.2 PVR = 1.5 WU Ao sat = 99% PA sat = 69%, 71%  Assessment: 1. Normal coronaries with left dominant system 2. Severe AI and dilated  aortic root by echo 3. Well compensated filling pressures  Plan/Discussion:  For possible aortic valve repair with Dr. Roxy Manns.   Bensimhon, Quillian Quince, MD  9:47 AM    Indications   Chronic systolic heart failure ([place]) [I50.22 (ICD-10-CM)]  Nonrheumatic aortic valve insufficiency [I35.1 (ICD-10-CM)]  Procedural Details/Technique   Technical Details The risks and indication of the procedure were explained. Consent was signed and placed on the chart. An appropriate timeout was taken prior to the procedure.   The right AC fossa was prepped and draped in the routine sterile fashion and anesthetized with 1% local lidocaine. The pre-existing PIV in the right [facility] was exchanged over a wire for a 5 FR venous sheath using a modified Seldinger technique. A standard Swan-Ganz catheter was used for the procedure.   After a normal Allen's test was confirmed, the right wrist was prepped and draped in the routine sterile fashion and anesthetized with 1% local lidocaine. A 5 FR arterial sheath was then placed in the right radial artery using a modified Seldinger technique. 3mg  IV verapamil was given through the sheath. Systemic heparin was administered. Standard catheters including a JL 4 and a JR 4 were used. All catheter exchanges were made over a wire.   Estimated blood loss <50 mL.  During this procedure the patient was administered the following to achieve and maintain moderate conscious sedation: Versed 1 mg, Fentanyl 25 mcg, while the patient's heart rate, blood pressure, and oxygen saturation were continuously monitored. The period of conscious sedation was 36 minutes, of which I was present face-to-face 100% of this time.    Coronary Findings   Dominance: Left  Left Main  Vessel is angiographically normal.  Left Anterior Descending  Vessel is angiographically normal.  Left Circumflex  Vessel is angiographically normal.  Right Coronary Artery  Vessel is angiographically normal.  Coronary  Diagrams   Diagnostic Diagram       Implants     No implant documentation for this case.  MERGE Images   Show images for CARDIAC CATHETERIZATION   Link to Procedure Log   Procedure Log    Hemo Data    Most Recent Value  Fick Cardiac Output 4.78 L/min  Fick Cardiac Output Index 2.16 (L/min)/BSA  RA A Wave 5 mmHg  RA V Wave 4 mmHg  RA Mean 4 mmHg  RV Systolic Pressure 32 mmHg  RV Diastolic Pressure 4 mmHg  RV EDP 7 mmHg  PA Systolic Pressure 31 mmHg  PA Diastolic Pressure 17 mmHg  PA Mean 23 mmHg  PW A Wave 18 mmHg  PW V Wave  17 mmHg  PW Mean 16 mmHg  AO Systolic Pressure 867 mmHg  AO Diastolic Pressure 75 mmHg  AO Mean 97 mmHg  LV Systolic Pressure 96 mmHg  LV Diastolic Pressure 5 mmHg  LV EDP 10 mmHg  Arterial Occlusion Pressure Extended Systolic Pressure 92 mmHg  Arterial Occlusion Pressure Extended Diastolic Pressure 56 mmHg  Arterial Occlusion Pressure Extended Mean Pressure 72 mmHg  Left Ventricular Apex Extended Systolic Pressure 90 mmHg  Left Ventricular Apex Extended Diastolic Pressure 8 mmHg  Left Ventricular Apex Extended EDP Pressure 12 mmHg  QP/QS 1  TPVR Index 10.64 HRUI  TSVR Index 44.88 HRUI  PVR SVR Ratio 0.08  TPVR/TSVR Ratio 0.24     Impression:  Patient has stage D severe symptomatic primary aortic insufficiency with nonischemic cardiomyopathy and chronic combined systolic and diastolic congestive heart failure.  At present he describes stable symptoms of exertional shortness of breath and fatigue that occur only with more strenuous physical exertion. Symptoms get worse quickly if the patient stops taking diuretics for even a relatively brief period time. I have personally reviewed the patient's recent transthoracic and transesophageal echocardiograms and diagnostic cardiac catheterization. Patient has at least moderate left ventricular systolic dysfunction and moderate diastolic dysfunction. There is moderate left ventricular chamber  enlargement. The aortic valve is trileaflet. There appears to be some prolapse of the noncoronary leaflet with severe aortic insufficiency. The aortic valve leaflets are thin and there are no signs of perforation or other abnormalities to suggest a previous history of endocarditis. I agree that surgical intervention is indicated and the patient appears to be good candidate for aortic valve repair.   Plan:  The patient and his father were counseled at length regarding treatment alternatives for management of severe aortic insufficiency including continued medical therapy versus proceeding with aortic valve repair or replacement in the near future.  The natural history of aortic insufficiency was reviewed, as was long term prognosis with medical therapy alone.  Surgical options were discussed at length including conventional surgical aortic valve repair or replacement through either a full median sternotomy or using minimally invasive techniques.  The many potential benefits of valve repair were discussed. Concerns regarding long-term durability following valve repair were reviewed, as were the potential improvements associated with recently developed prosthetic ring designed for use in patients with aortic insufficiency (the HAART Biostable ring annuloplasty system). Other alternatives including the Ross autograft procedure and homograft aortic root replacement were discussed.  In the event that the patient's valve cannot be successfully repaired, discussion was held comparing the relative risks of mechanical valve replacement with need for lifelong anticoagulation versus use of a bioprosthetic tissue valve and the associated potential for late structural valve deterioration and failure.   The patient understands and accepts all potential associated risks of surgery including but not limited to risk of death, stroke, myocardial infarction, congestive heart failure, respiratory failure, renal failure, pneumonia,  bleeding requiring blood transfusion and or reexploration, arrhythmia, heart block or bradycardia requiring permanent pacemaker, aortic dissection or other major vascular complication, pleural effusions or other delayed complications related to continued congestive heart failure, and other late complications related to valve repair or replacement including structural valve deterioration and failure, thrombosis, endocarditis, or paravalvular leak.  We tentatively plan to proceed with surgery on Thursday, 05/13/2017. The patient will return to our office for follow-up on Monday, 05/10/2017 prior to surgery.   I spent in excess of 90 minutes during the conduct of this office consultation and >50% of this  time involved direct face-to-face encounter with the patient for counseling and/or coordination of their care.    Valentina Gu. Roxy Manns, MD 04/23/2017 11:58 AM

## 2017-05-11 NOTE — Anesthesia Procedure Notes (Addendum)
Central Venous Catheter Insertion Performed by: Belinda Block, anesthesiologist Start/End10/30/2018 6:40 AM, 05/11/2017 6:56 AM Preanesthetic checklist: patient identified, IV checked, site marked, risks and benefits discussed, surgical consent, monitors and equipment checked, pre-op evaluation and timeout performed Position: Trendelenburg Lidocaine 1% used for infiltration and patient sedated Hand hygiene performed , maximum sterile barriers used  and Seldinger technique used PA cath was placed.Sheath introducer Swan type:thermodilution Procedure performed using ultrasound guided technique. Ultrasound Notes:anatomy identified and image(s) printed for medical record Attempts: 1 Following insertion, line sutured, dressing applied and Biopatch. Post procedure assessment: blood return through all ports  Patient tolerated the procedure well with no immediate complications.

## 2017-05-11 NOTE — Progress Notes (Signed)
CT Surgery  Resting comfortably NSR Will use prn hydralazine for elevated BP

## 2017-05-11 NOTE — Anesthesia Procedure Notes (Signed)
Arterial Line Insertion Start/End10/30/2018 6:40 AM, 05/11/2017 6:48 AM Performed by: Imagene Riches, CRNA  Patient location: ICU. Preanesthetic checklist: patient identified, IV checked, site marked, risks and benefits discussed, surgical consent, monitors and equipment checked, pre-op evaluation and anesthesia consent Patient sedated Left, radial was placed Catheter size: 20 G Hand hygiene performed  and maximum sterile barriers used  Allen's test indicative of satisfactory collateral circulation Attempts: 1 Procedure performed without using ultrasound guided technique. Following insertion, Biopatch and dressing applied. Post procedure assessment: normal  Patient tolerated the procedure well with no immediate complications.

## 2017-05-11 NOTE — Interval H&P Note (Signed)
History and Physical Interval Note:  05/11/2017 6:16 AM  [name]  has presented today for surgery, with the diagnosis of Aortic Insufficiency  The various methods of treatment have been discussed with the patient and family. After consideration of risks, benefits and other options for treatment, the patient has consented to  Procedure(s): AORTIC VALVE REPAIR or Replacement (N/A) AORTIC VALVE REPLACEMENT (AVR) (N/A) TRANSESOPHAGEAL ECHOCARDIOGRAM (TEE) (N/A) as a surgical intervention .  The patient's history has been reviewed, patient examined, no change in status, stable for surgery.  I have reviewed the patient's chart and labs.  Questions were answered to the patient's satisfaction.     [name] [name]

## 2017-05-11 NOTE — Anesthesia Procedure Notes (Signed)
Procedure Name: Intubation Date/Time: 05/11/2017 7:59 AM Performed by: Imagene Riches Pre-anesthesia Checklist: Patient identified, Emergency Drugs available, Suction available and Patient being monitored Patient Re-evaluated:Patient Re-evaluated prior to induction Oxygen Delivery Method: Circle System Utilized Preoxygenation: Pre-oxygenation with 100% oxygen Induction Type: IV induction Ventilation: Two handed mask ventilation required and Oral airway inserted - appropriate to patient size Laryngoscope Size: Miller and 3 Grade View: Grade II Tube type: Oral Tube size: 8.0 mm Number of attempts: 3 Airway Equipment and Method: Stylet,  Oral airway and Bougie stylet Placement Confirmation: ETT inserted through vocal cords under direct vision,  positive ETCO2 and breath sounds checked- equal and bilateral Secured at: 24 cm Tube secured with: Tape Dental Injury: Teeth and Oropharynx as per pre-operative assessment  Difficulty Due To: Difficulty was anticipated and Difficult Airway- due to large tongue Comments: First DL by Beverlee Nims, CRNA with miller 2, unable to reach epiglottis with blade. Second DL by Beverlee Nims, CRNA with miller 3, grade IV view. Patient mask ventilated. Third DL by Dr. Ermalene Postin, with miller 3, grade 2 view. ETT pass through cord with bougie.

## 2017-05-11 NOTE — Care Management Note (Addendum)
Case Management Note  Patient Details  Name: [name] [name] MRN: [id number] Date of Birth: 1980-10-12  Subjective/Objective:   From home with wife who can asst him at discharge  , presents with aortic insufficiency, post op AVR.  Wife will be with him for the firs week, she works, then [employer] and Dad will be with him for the rest of the time to assist him at home.  11/2 1014 [name] RN, BSN - POD 3 AVR,  cont to ambulate, diuresis, will restart entresto which he was taking pta, dc pacing wires.                  Action/Plan: NCM will follow for dc needs.   Expected Discharge Date:                  Expected Discharge Plan:     In-House Referral:     Discharge planning Services  CM Consult  Post Acute Care Choice:    Choice offered to:     DME Arranged:    DME Agency:     HH Arranged:    HH Agency:     Status of Service:  In process, will continue to follow  If discussed at Long Length of Stay Meetings, dates discussed:    Additional Comments:  Zenon Mayo, RN 05/11/2017, 4:17 PM

## 2017-05-11 NOTE — Transfer of Care (Signed)
Immediate Anesthesia Transfer of Care Note  Patient: ERDEM NAAS  Procedure(s) Performed: AORTIC VALVE REPAIR (N/A Chest) TRANSESOPHAGEAL ECHOCARDIOGRAM (TEE) (N/A )  Patient Location: SICU  Anesthesia Type:General  Level of Consciousness: sedated, unresponsive and Patient remains intubated per anesthesia plan  Airway & Oxygen Therapy: Patient remains intubated per anesthesia plan and Patient placed on Ventilator (see vital sign flow sheet for setting)  Post-op Assessment: Report given to RN and Post -op Vital signs reviewed and stable  Post vital signs: Reviewed and stable  Last Vitals:  Vitals:   05/11/17 0552  BP: (!) 179/87  Pulse: 84  Resp: 18  Temp: 36.8 C  SpO2: 99%    Last Pain:  Vitals:   05/11/17 0552  TempSrc: Oral         Complications: No apparent anesthesia complications

## 2017-05-11 NOTE — Anesthesia Procedure Notes (Signed)
Procedures

## 2017-05-11 NOTE — Brief Op Note (Addendum)
05/11/2017  1:23 PM  PATIENT:  [name] [name]  36 y.o. male  PRE-OPERATIVE DIAGNOSIS:  Aortic Insufficiency  POST-OPERATIVE DIAGNOSIS:  Aortic insufficiency  PROCEDURE:  Aortic Valve Repair  SURGEON:  Surgeon(s) and Role:    Rexene Alberts, MD - Primary    * Gilford Raid, MD  PHYSICIAN ASSISTANT:  Nicholes Rough, PA-C   ANESTHESIA:   general  EBL: TBD  BLOOD ADMINISTERED:none  DRAINS: routine   LOCAL MEDICATIONS USED:  NONE  SPECIMEN:  No Specimen  DISPOSITION OF SPECIMEN:  N/A  COUNTS:  YES  TOURNIQUET:  * No tourniquets in log *  DICTATION: .Dragon Dictation  PLAN OF CARE: Admit to inpatient   PATIENT DISPOSITION:  ICU - intubated and hemodynamically stable.   Delay start of Pharmacological VTE agent (>24hrs) due to surgical blood loss or risk of bleeding: yes  Rexene Alberts, MD 05/11/2017 1:24 PM

## 2017-05-11 NOTE — Anesthesia Preprocedure Evaluation (Addendum)
Anesthesia Evaluation  Patient identified by MRN, date of birth, ID band Patient awake    Reviewed: Allergy & Precautions, NPO status , Patient's Chart, lab work & pertinent test results  History of Anesthesia Complications Negative for: history of anesthetic complications  Airway Mallampati: III  TM Distance: >3 FB Neck ROM: Full    Dental  (+) Teeth Intact, Dental Advisory Given   Pulmonary shortness of breath and with exertion, sleep apnea ,    breath sounds clear to auscultation       Cardiovascular hypertension, Pt. on medications +CHF  + Valvular Problems/Murmurs  Rhythm:Regular - Systolic murmurs    Neuro/Psych  Headaches, Anxiety    GI/Hepatic   Endo/Other    Renal/GU Renal disease     Musculoskeletal   Abdominal   Peds  Hematology   Anesthesia Other Findings   Reproductive/Obstetrics                            Anesthesia Physical Anesthesia Plan  ASA: IV  Anesthesia Plan: General   Post-op Pain Management:    Induction: Intravenous  PONV Risk Score and Plan: 2 and Treatment may vary due to age or medical condition  Airway Management Planned: Oral ETT  Additional Equipment: Arterial line, CVP, PA Cath, 3D TEE and Ultrasound Guidance Line Placement  Intra-op Plan:   Post-operative Plan: Post-operative intubation/ventilation  Informed Consent: I have reviewed the patients History and Physical, chart, labs and discussed the procedure including the risks, benefits and alternatives for the proposed anesthesia with the patient or authorized representative who has indicated his/her understanding and acceptance.   Dental advisory given  Plan Discussed with: CRNA, Anesthesiologist and Surgeon  Anesthesia Plan Comments:        Anesthesia Quick Evaluation

## 2017-05-12 ENCOUNTER — Inpatient Hospital Stay (HOSPITAL_COMMUNITY): Payer: BLUE CROSS/BLUE SHIELD

## 2017-05-12 ENCOUNTER — Encounter (HOSPITAL_COMMUNITY): Payer: Self-pay | Admitting: Thoracic Surgery (Cardiothoracic Vascular Surgery)

## 2017-05-12 LAB — BASIC METABOLIC PANEL
ANION GAP: 8 (ref 5–15)
BUN: 14 mg/dL (ref 6–20)
CALCIUM: 7.4 mg/dL — AB (ref 8.9–10.3)
CHLORIDE: 106 mmol/L (ref 101–111)
CO2: 21 mmol/L — AB (ref 22–32)
Creatinine, Ser: 1.57 mg/dL — ABNORMAL HIGH (ref 0.61–1.24)
GFR calc Af Amer: >60 mL/min (ref >60)
GFR calc non Af Amer: 55 mL/min — ABNORMAL LOW (ref >60)
GLUCOSE: 149 mg/dL — AB (ref 65–99)
Potassium: 4.2 mmol/L (ref 3.5–5.1)
Sodium: 135 mmol/L (ref 135–145)

## 2017-05-12 LAB — CBC
HCT: 39.2 % (ref 39.0–52.0)
HEMATOCRIT: 38.5 % — AB (ref 39.0–52.0)
HEMOGLOBIN: 12.9 g/dL — AB (ref 13.0–17.0)
Hemoglobin: 13 g/dL (ref 13.0–17.0)
MCH: 26.9 pg (ref 26.0–34.0)
MCH: 27.7 pg (ref 26.0–34.0)
MCHC: 32.9 g/dL (ref 30.0–36.0)
MCHC: 33.8 g/dL (ref 30.0–36.0)
MCV: 81.8 fL (ref 78.0–100.0)
MCV: 81.9 fL (ref 78.0–100.0)
Platelets: 116 10*3/uL — ABNORMAL LOW (ref 150–400)
RBC: 4.79 MIL/uL (ref 4.22–5.81)
RBC: 4.7 MIL/uL (ref 4.22–5.81)
RDW: 14 % (ref 11.5–15.5)
RDW: 14.5 % (ref 11.5–15.5)
WBC: 13.8 10*3/uL — AB (ref 4.0–10.5)
WBC: 16.2 10*3/uL — ABNORMAL HIGH (ref 4.0–10.5)

## 2017-05-12 LAB — BLOOD GAS, ARTERIAL
ACID-BASE DEFICIT: 3.1 mmol/L — AB (ref 0.0–2.0)
Bicarbonate: 20.8 mmol/L (ref 20.0–28.0)
O2 Content: 4 L/min
O2 Saturation: 96.7 %
PCO2 ART: 34 mmHg (ref 32.0–48.0)
PH ART: 7.405 (ref 7.350–7.450)
Patient temperature: 98.6
pO2, Arterial: 90.1 mmHg (ref 83.0–108.0)

## 2017-05-12 LAB — MAGNESIUM
MAGNESIUM: 2.3 mg/dL (ref 1.7–2.4)
Magnesium: 2.6 mg/dL — ABNORMAL HIGH (ref 1.7–2.4)

## 2017-05-12 LAB — CREATININE, SERUM
Creatinine, Ser: 2.2 mg/dL — ABNORMAL HIGH (ref 0.61–1.24)
GFR calc Af Amer: 43 mL/min — ABNORMAL LOW (ref >60)
GFR calc non Af Amer: 37 mL/min — ABNORMAL LOW (ref >60)

## 2017-05-12 LAB — GLUCOSE, CAPILLARY
GLUCOSE-CAPILLARY: 148 mg/dL — AB (ref 65–99)
GLUCOSE-CAPILLARY: 151 mg/dL — AB (ref 65–99)
GLUCOSE-CAPILLARY: 127 mg/dL — AB (ref 65–99)
GLUCOSE-CAPILLARY: 115 mg/dL — AB (ref 65–99)
GLUCOSE-CAPILLARY: 113 mg/dL — AB (ref 65–99)
GLUCOSE-CAPILLARY: 108 mg/dL — AB (ref 65–99)
GLUCOSE-CAPILLARY: 143 mg/dL — AB (ref 65–99)
GLUCOSE-CAPILLARY: 150 mg/dL — AB (ref 65–99)
Glucose-Capillary: 137 mg/dL — ABNORMAL HIGH (ref 65–99)
Glucose-Capillary: 164 mg/dL — ABNORMAL HIGH (ref 65–99)
Glucose-Capillary: 162 mg/dL — ABNORMAL HIGH (ref 65–99)
Glucose-Capillary: 133 mg/dL — ABNORMAL HIGH (ref 65–99)
Glucose-Capillary: 141 mg/dL — ABNORMAL HIGH (ref 65–99)
Glucose-Capillary: 125 mg/dL — ABNORMAL HIGH (ref 65–99)
Glucose-Capillary: 94 mg/dL (ref 65–99)
Glucose-Capillary: 133 mg/dL — ABNORMAL HIGH (ref 65–99)

## 2017-05-12 LAB — COOXEMETRY PANEL
CARBOXYHEMOGLOBIN: 0.8 % (ref 0.5–1.5)
Carboxyhemoglobin: 0.8 % (ref 0.5–1.5)
METHEMOGLOBIN: 1.2 % (ref 0.0–1.5)
Methemoglobin: 1.2 % (ref 0.0–1.5)
O2 SAT: 96.1 %
O2 SAT: 74.5 %
TOTAL HEMOGLOBIN: 13.3 g/dL (ref 12.0–16.0)
TOTAL HEMOGLOBIN: 12.3 g/dL (ref 12.0–16.0)

## 2017-05-12 LAB — POCT I-STAT, CHEM 8
BUN: 19 mg/dL (ref 6–20)
Calcium, Ion: 1.12 mmol/L — ABNORMAL LOW (ref 1.15–1.40)
Chloride: 101 mmol/L (ref 101–111)
Creatinine, Ser: 2.1 mg/dL — ABNORMAL HIGH (ref 0.61–1.24)
GLUCOSE: 144 mg/dL — AB (ref 65–99)
HEMATOCRIT: 42 % (ref 39.0–52.0)
HEMOGLOBIN: 14.3 g/dL (ref 13.0–17.0)
POTASSIUM: 4.2 mmol/L (ref 3.5–5.1)
Sodium: 135 mmol/L (ref 135–145)
TCO2: 22 mmol/L (ref 22–32)

## 2017-05-12 MED ORDER — INSULIN ASPART 100 UNIT/ML ~~LOC~~ SOLN
0.0000 [IU] | SUBCUTANEOUS | Status: DC
  Administered 2017-05-12 – 2017-05-13 (×5): 2 [IU] via SUBCUTANEOUS

## 2017-05-12 MED ORDER — FUROSEMIDE 10 MG/ML IJ SOLN
40.0000 mg | Freq: Two times a day (BID) | INTRAMUSCULAR | Status: AC
  Administered 2017-05-12 – 2017-05-14 (×6): 40 mg via INTRAVENOUS
  Filled 2017-05-12 (×6): qty 4

## 2017-05-12 MED ORDER — CARVEDILOL 25 MG PO TABS
25.0000 mg | ORAL_TABLET | Freq: Two times a day (BID) | ORAL | Status: DC
  Administered 2017-05-12 – 2017-05-16 (×9): 25 mg via ORAL
  Filled 2017-05-12 (×9): qty 1

## 2017-05-12 MED ORDER — INSULIN DETEMIR 100 UNIT/ML ~~LOC~~ SOLN
20.0000 [IU] | Freq: Once | SUBCUTANEOUS | Status: AC
  Administered 2017-05-12: 20 [IU] via SUBCUTANEOUS
  Filled 2017-05-12: qty 0.2

## 2017-05-12 MED FILL — Heparin Sodium (Porcine) Inj 1000 Unit/ML: INTRAMUSCULAR | Qty: 2500 | Status: AC

## 2017-05-12 MED FILL — Potassium Chloride Inj 2 mEq/ML: INTRAVENOUS | Qty: 40 | Status: AC

## 2017-05-12 MED FILL — Magnesium Sulfate Inj 50%: INTRAMUSCULAR | Qty: 10 | Status: AC

## 2017-05-12 MED FILL — Heparin Sodium (Porcine) Inj 1000 Unit/ML: INTRAMUSCULAR | Qty: 30 | Status: AC

## 2017-05-12 NOTE — Progress Notes (Signed)
      [address]       [place] [address]             [phone]      POD # 1 AV repair  Sleeping  BP 106/78   Pulse 93   Temp 98.2 F (36.8 C) (Oral)   Resp (!) 24   Ht 5\' 10"  (1.778 m)   Wt 240 lb 15.4 oz (109.3 kg)   SpO2 93%   BMI 34.57 kg/m    Intake/Output Summary (Last 24 hours) at 05/12/17 2131 Last data filed at 05/12/17 2100  Gross per 24 hour  Intake          2062.74 ml  Output             2050 ml  Net            12.74 ml   K= 4.2, creatinine 2.1 up from 1.57  On milrinone @ 0.2  Continue present care  Remo Lipps C. Roxan Hockey, MD [facility] [phone]

## 2017-05-12 NOTE — Progress Notes (Signed)
Stopped in to visit with patient while rounding the floor.  Patient sitting up in chair visiting with family members that are in the room.  Patient says he is doing okay.  Will continue to follow up with patient as he recovers.    05/12/17 1554  Clinical Encounter Type  Visited With Patient;Family  Visit Type Initial;Psychological support;Social support;Spiritual support

## 2017-05-12 NOTE — Progress Notes (Addendum)
05/12/2017 0800 Dr. Roxy Manns on floor. Aline with significant whip in reading. Consistently 30-40 points higher systolic than cuff. Verbal order Dr. Roxy Manns ok to d/c A-line and utilize BP cuff to titrate gtts. Orders enacted. Pt.updated on plan of care. Will continue to closely monitor patient.  [name] [name], [name]

## 2017-05-12 NOTE — Progress Notes (Addendum)
      [address]       [place] [address]             [phone]        CARDIOTHORACIC SURGERY PROGRESS NOTE   R1 Day Post-Op Procedure(s) (LRB): AORTIC VALVE REPAIR (N/A) TRANSESOPHAGEAL ECHOCARDIOGRAM (TEE) (N/A)  Subjective: Feels sore in chest.  Otherwise okay.  Denies SOB, nausea  Objective: Vital signs: BP Readings from Last 1 Encounters:  05/12/17 119/73   Pulse Readings from Last 1 Encounters:  05/12/17 (!) 103   Resp Readings from Last 1 Encounters:  05/12/17 (!) 23   Temp Readings from Last 1 Encounters:  05/12/17 99 F (37.2 C)    Hemodynamics: PAP: (17-31)/(7-20) 24/11 CO:  [3.6 L/min-5.6 L/min] 5.6 L/min CI:  [1.6 L/min/m2-2.6 L/min/m2] 2.6 L/min/m2  Physical Exam:  Rhythm:   Sinus tach  Breath sounds: clear  Heart sounds:  RRR w/ soft systolic murmur  Incisions:  Dressing dry, intact  Abdomen:  Soft, non-distended, non-tender  Extremities:  Warm, well-perfused  Chest tubes:  low volume thin serosanguinous output, no air leak    Intake/Output from previous day: 10/30 0701 - 10/31 0700 In: 5916.6 [P.O.:60; I.V.:4406.6; Blood:1000; IV Piggyback:450] Out: 5885 [Urine:4720; Blood:1220; Chest Tube:300] Intake/Output this shift: No intake/output data recorded.  Lab Results:  CBC: Recent Labs  05/11/17 1950 05/11/17 1953 05/12/17 0411  WBC 13.9*  --  13.8*  HGB 14.3 13.6 12.9*  HCT 41.5 40.0 39.2  PLT 120*  --  PENDING    BMET:  Recent Labs  05/11/17 1953 05/12/17 0411  NA 136 135  K 4.2 4.2  CL 110 106  CO2  --  21*  GLUCOSE 178* 149*  BUN 19 14  CREATININE 1.40* 1.57*  CALCIUM  --  7.4*     PT/INR:   Recent Labs  05/11/17 1400  LABPROT 16.2*  INR 1.32    CBG (last 3)   Recent Labs  05/12/17 0404 05/12/17 0502 05/12/17 0557  GLUCAP 162* 141* 133*    ABG    Component Value Date/Time   PHART 7.405 05/12/2017 0520   PCO2ART 34.0 05/12/2017 0520   PO2ART 90.1 05/12/2017 0520   HCO3 20.8  05/12/2017 0520   TCO2 28 05/11/2017 1953   ACIDBASEDEF 3.1 (H) 05/12/2017 0520   O2SAT 74.5 05/12/2017 0525    CXR: Clear  EKG: NSR (sinus tach) w/out acute ischemic changes    Assessment/Plan: S/P Procedure(s) (LRB): AORTIC VALVE REPAIR (N/A) TRANSESOPHAGEAL ECHOCARDIOGRAM (TEE) (N/A)  Overall doing well POD1 Maintaining NSR w/ stable hemodynamics on low dose dopamine and milrinone, although tachycardic and hypertensive, PA pressures relatively low and cardiac output high Breathing comfortably w/ O2 sats 97-98% on 4 L/min Chronic combined systolic and diastolic CHF with expected post-op volume excess, weight up 11 lbs CKD stage III with baseline creatinine 1.8 preop, adequate UOP and creatinine currently below baseline  Expected post op acute blood loss anemia, very mild Expected post op atelectasis, very mild Long-standing hypertension   D/C dopamine  Restart carvedilol  Wean milrinone slowly  Mobilize  Diuresis D/C tubes later today or tomorrow, depending on output   Rexene Alberts, MD 05/12/2017 7:58 AM

## 2017-05-13 ENCOUNTER — Inpatient Hospital Stay (HOSPITAL_COMMUNITY): Payer: BLUE CROSS/BLUE SHIELD

## 2017-05-13 LAB — GLUCOSE, CAPILLARY
GLUCOSE-CAPILLARY: 110 mg/dL — AB (ref 65–99)
GLUCOSE-CAPILLARY: 138 mg/dL — AB (ref 65–99)
GLUCOSE-CAPILLARY: 142 mg/dL — AB (ref 65–99)
Glucose-Capillary: 124 mg/dL — ABNORMAL HIGH (ref 65–99)
Glucose-Capillary: 121 mg/dL — ABNORMAL HIGH (ref 65–99)
Glucose-Capillary: 119 mg/dL — ABNORMAL HIGH (ref 65–99)

## 2017-05-13 LAB — BASIC METABOLIC PANEL
Anion gap: 8 (ref 5–15)
BUN: 18 mg/dL (ref 6–20)
CALCIUM: 8 mg/dL — AB (ref 8.9–10.3)
CO2: 22 mmol/L (ref 22–32)
CREATININE: 2.09 mg/dL — AB (ref 0.61–1.24)
Chloride: 103 mmol/L (ref 101–111)
GFR calc non Af Amer: 39 mL/min — ABNORMAL LOW (ref >60)
GFR, EST AFRICAN AMERICAN: 45 mL/min — AB (ref >60)
Glucose, Bld: 123 mg/dL — ABNORMAL HIGH (ref 65–99)
Potassium: 4.2 mmol/L (ref 3.5–5.1)
Sodium: 133 mmol/L — ABNORMAL LOW (ref 135–145)

## 2017-05-13 LAB — COOXEMETRY PANEL
CARBOXYHEMOGLOBIN: 1.9 % — AB (ref 0.5–1.5)
METHEMOGLOBIN: 0.9 % (ref 0.0–1.5)
O2 SAT: 91.3 %
Total hemoglobin: 6 g/dL — CL (ref 12.0–16.0)

## 2017-05-13 LAB — CBC
HCT: 36.3 % — ABNORMAL LOW (ref 39.0–52.0)
Hemoglobin: 12.1 g/dL — ABNORMAL LOW (ref 13.0–17.0)
MCH: 27.4 pg (ref 26.0–34.0)
MCHC: 33.3 g/dL (ref 30.0–36.0)
MCV: 82.3 fL (ref 78.0–100.0)
PLATELETS: 92 10*3/uL — AB (ref 150–400)
RBC: 4.41 MIL/uL (ref 4.22–5.81)
RDW: 14.3 % (ref 11.5–15.5)
WBC: 13.2 10*3/uL — ABNORMAL HIGH (ref 4.0–10.5)

## 2017-05-13 MED ORDER — HYDRALAZINE HCL 25 MG PO TABS
25.0000 mg | ORAL_TABLET | Freq: Three times a day (TID) | ORAL | Status: DC
  Administered 2017-05-13 – 2017-05-16 (×10): 25 mg via ORAL
  Filled 2017-05-13 (×10): qty 1

## 2017-05-13 MED ORDER — MOVING RIGHT ALONG BOOK
Freq: Once | Status: AC
  Administered 2017-05-13: 1
  Filled 2017-05-13: qty 1

## 2017-05-13 MED ORDER — SODIUM CHLORIDE 0.9% FLUSH
3.0000 mL | Freq: Two times a day (BID) | INTRAVENOUS | Status: DC
  Administered 2017-05-13: 10 mL via INTRAVENOUS
  Administered 2017-05-13: 3 mL via INTRAVENOUS

## 2017-05-13 MED ORDER — SODIUM CHLORIDE 0.9% FLUSH: 3.0000 mL | INTRAVENOUS | Status: DC | PRN

## 2017-05-13 MED ORDER — INSULIN ASPART 100 UNIT/ML ~~LOC~~ SOLN
0.0000 [IU] | Freq: Three times a day (TID) | SUBCUTANEOUS | Status: DC
  Administered 2017-05-13: 2 [IU] via SUBCUTANEOUS

## 2017-05-13 MED ORDER — SODIUM CHLORIDE 0.9 % IV SOLN: 250.0000 mL | INTRAVENOUS | Status: DC | PRN

## 2017-05-13 NOTE — Progress Notes (Signed)
Patient ID: [name] [name], male   DOB: 10-29-1980, 36 y.o.   MRN: [id number] [facility] Evening Rounds  He has been hemodynamically stable in sinus rhythm today.   sats 94% on RA  Ambulating well Good urine output.  Continue current plan

## 2017-05-13 NOTE — Anesthesia Postprocedure Evaluation (Addendum)
Anesthesia Post Note  Patient: [name] [name]  Procedure(s) Performed: AORTIC VALVE REPAIR (N/A Chest) TRANSESOPHAGEAL ECHOCARDIOGRAM (TEE) (N/A )     Patient location during evaluation: SICU Anesthesia Type: General Level of consciousness: sedated Pain management: pain level controlled Vital Signs Assessment: post-procedure vital signs reviewed and stable Respiratory status: patient remains intubated per anesthesia plan Cardiovascular status: stable Postop Assessment: no apparent nausea or vomiting Anesthetic complications: no    Last Vitals:  Vitals:   05/13/17 0145 05/13/17 0200  BP: 111/72   Pulse: 96 92  Resp: (!) 21 (!) 25  Temp:    SpO2: 95% 94%    Last Pain:  Vitals:   05/13/17 0000  TempSrc:   PainSc: 2                  Ronte Parker

## 2017-05-13 NOTE — Progress Notes (Signed)
      [address]       [place] [address]             [phone]        CARDIOTHORACIC SURGERY PROGRESS NOTE   R2 Days Post-Op Procedure(s) (LRB): AORTIC VALVE REPAIR (N/A) TRANSESOPHAGEAL ECHOCARDIOGRAM (TEE) (N/A)  Subjective: Feels better.  Still sore in chest but overall doing well.  Objective: Vital signs: BP Readings from Last 1 Encounters:  05/13/17 115/90   Pulse Readings from Last 1 Encounters:  05/13/17 96   Resp Readings from Last 1 Encounters:  05/13/17 20   Temp Readings from Last 1 Encounters:  05/13/17 98.6 F (37 C) (Oral)    Hemodynamics: PAP: (33-35)/(19-21) 33/19  Mixed venous co-ox 91% (accuracy?)   Physical Exam:  Rhythm:   sinus  Breath sounds: clear  Heart sounds:  RRR w/ soft systolic murmur, no diastolic murmur  Incisions:  Dressing dry, intact  Abdomen:  Soft, non-distended, non-tender  Extremities:  Warm, well-perfused  Chest tubes:  low volume thin serosanguinous output, no air leak    Intake/Output from previous day: 10/31 0701 - 11/01 0700 In: 1095.3 [I.V.:895.3; IV Piggyback:200] Out: 1850 [Urine:1530; Chest Tube:320] Intake/Output this shift: No intake/output data recorded.  Lab Results:  CBC: Recent Labs  05/12/17 1816 05/12/17 1817 05/13/17 0407  WBC 16.2*  --  13.2*  HGB 13.0 14.3 12.1*  HCT 38.5* 42.0 36.3*  PLT CONSISTENT WITH PREVIOUS RESULT  --  92*    BMET:  Recent Labs  05/12/17 0411  05/12/17 1817 05/13/17 0407  NA 135  --  135 133*  K 4.2  --  4.2 4.2  CL 106  --  101 103  CO2 21*  --   --  22  GLUCOSE 149*  --  144* 123*  BUN 14  --  19 18  CREATININE 1.57*  < > 2.10* 2.09*  CALCIUM 7.4*  --   --  8.0*  < > = values in this interval not displayed.   PT/INR:   Recent Labs  05/11/17 1400  LABPROT 16.2*  INR 1.32    CBG (last 3)   Recent Labs  05/12/17 1942 05/13/17 0000 05/13/17 0346  GLUCAP 150* 142* 124*    ABG    Component Value Date/Time   PHART  7.405 05/12/2017 0520   PCO2ART 34.0 05/12/2017 0520   PO2ART 90.1 05/12/2017 0520   HCO3 20.8 05/12/2017 0520   TCO2 22 05/12/2017 1817   ACIDBASEDEF 3.1 (H) 05/12/2017 0520   O2SAT 91.3 05/13/2017 0532    CXR: Moderate pulm vasc congestion and edema, bibasilar atelectasis  Assessment/Plan: S/P Procedure(s) (LRB): AORTIC VALVE REPAIR (N/A) TRANSESOPHAGEAL ECHOCARDIOGRAM (TEE) (N/A)  Overall doing well POD2 Maintaining NSR w/ stable BP, although still somewhat hypertensive Breathing comfortably w/ O2 sats 95-96% on room air Chronic combined systolic and diastolic CHF with expected post-op volume excess, weight up 11 lbs CKD stage III with baseline creatinine 1.8 preop, adequate UOP and creatinine up slightly, likely due to prerenal azotemia +/- acute kidney injury caused by ATN Expected post op acute blood loss anemia, very mild Expected post op atelectasis, very mild Long-standing hypertension   Mobilize  D/C tubes and lines  D/C milrinone  Restart oral hydralazine  Diuresis  Rexene Alberts, MD 05/13/2017 8:19 AM

## 2017-05-14 ENCOUNTER — Encounter (HOSPITAL_COMMUNITY): Payer: Self-pay | Admitting: *Deleted

## 2017-05-14 ENCOUNTER — Inpatient Hospital Stay (HOSPITAL_COMMUNITY): Payer: BLUE CROSS/BLUE SHIELD

## 2017-05-14 LAB — GLUCOSE, CAPILLARY
Glucose-Capillary: 125 mg/dL — ABNORMAL HIGH (ref 65–99)
Glucose-Capillary: 134 mg/dL — ABNORMAL HIGH (ref 65–99)

## 2017-05-14 LAB — BASIC METABOLIC PANEL
ANION GAP: 9 (ref 5–15)
BUN: 17 mg/dL (ref 6–20)
CHLORIDE: 99 mmol/L — AB (ref 101–111)
CO2: 25 mmol/L (ref 22–32)
CREATININE: 1.86 mg/dL — AB (ref 0.61–1.24)
Calcium: 8.3 mg/dL — ABNORMAL LOW (ref 8.9–10.3)
GFR calc non Af Amer: 45 mL/min — ABNORMAL LOW (ref >60)
GFR, EST AFRICAN AMERICAN: 52 mL/min — AB (ref >60)
Glucose, Bld: 124 mg/dL — ABNORMAL HIGH (ref 65–99)
POTASSIUM: 4.1 mmol/L (ref 3.5–5.1)
SODIUM: 133 mmol/L — AB (ref 135–145)

## 2017-05-14 LAB — CBC
HEMATOCRIT: 36.2 % — AB (ref 39.0–52.0)
HEMOGLOBIN: 12.6 g/dL — AB (ref 13.0–17.0)
MCH: 28.6 pg (ref 26.0–34.0)
MCHC: 34.8 g/dL (ref 30.0–36.0)
MCV: 82.1 fL (ref 78.0–100.0)
Platelets: 102 10*3/uL — ABNORMAL LOW (ref 150–400)
RBC: 4.41 MIL/uL (ref 4.22–5.81)
RDW: 14 % (ref 11.5–15.5)
WBC: 11.2 10*3/uL — ABNORMAL HIGH (ref 4.0–10.5)

## 2017-05-14 MED ORDER — CLOBETASOL PROPIONATE 0.05 % EX CREA
1.0000 "application " | TOPICAL_CREAM | CUTANEOUS | Status: DC
  Administered 2017-05-14 – 2017-05-16 (×2): 1 via TOPICAL
  Filled 2017-05-14: qty 15

## 2017-05-14 MED ORDER — FUROSEMIDE 40 MG PO TABS
40.0000 mg | ORAL_TABLET | Freq: Two times a day (BID) | ORAL | Status: DC
  Administered 2017-05-15 (×2): 40 mg via ORAL
  Filled 2017-05-14 (×2): qty 1

## 2017-05-14 MED ORDER — SACUBITRIL-VALSARTAN 97-103 MG PO TABS
1.0000 | ORAL_TABLET | Freq: Two times a day (BID) | ORAL | Status: DC
  Administered 2017-05-14 – 2017-05-16 (×5): 1 via ORAL
  Filled 2017-05-14 (×5): qty 1

## 2017-05-14 MED ORDER — ACETAMINOPHEN 500 MG PO TABS: 1000.0000 mg | ORAL_TABLET | Freq: Four times a day (QID) | ORAL | Status: DC | PRN

## 2017-05-14 NOTE — Progress Notes (Addendum)
      [address]       [place] [address]             [phone]        CARDIOTHORACIC SURGERY PROGRESS NOTE   R3 Days Post-Op Procedure(s) (LRB): AORTIC VALVE REPAIR (N/A) TRANSESOPHAGEAL ECHOCARDIOGRAM (TEE) (N/A)  Subjective: Looks good.  Feels better.  Slept better.  Chest discomfort improved.  Objective: Vital signs: BP Readings from Last 1 Encounters:  05/14/17 (!) 155/109   Pulse Readings from Last 1 Encounters:  05/14/17 92   Resp Readings from Last 1 Encounters:  05/14/17 (!) 22   Temp Readings from Last 1 Encounters:  05/14/17 98.6 F (37 C) (Oral)    Hemodynamics:    Physical Exam:  Rhythm:   sinus  Breath sounds: clear  Heart sounds:  RRR  Incisions:  Dressing dry, intact  Abdomen:  Soft, non-distended, non-tender  Extremities:  Warm, well-perfused    Intake/Output from previous day: 11/01 0701 - 11/02 0700 In: 659.7 [P.O.:600; I.V.:59.7] Out: 1370 [Urine:1350; Chest Tube:20] Intake/Output this shift: No intake/output data recorded.  Lab Results:  CBC: Recent Labs  05/13/17 0407 05/14/17 0339  WBC 13.2* 11.2*  HGB 12.1* 12.6*  HCT 36.3* 36.2*  PLT 92* 102*    BMET:  Recent Labs  05/13/17 0407 05/14/17 0339  NA 133* 133*  K 4.2 4.1  CL 103 99*  CO2 22 25  GLUCOSE 123* 124*  BUN 18 17  CREATININE 2.09* 1.86*  CALCIUM 8.0* 8.3*     PT/INR:   Recent Labs  05/11/17 1400  LABPROT 16.2*  INR 1.32    CBG (last 3)   Recent Labs  05/13/17 1241 05/13/17 1611 05/13/17 2325  GLUCAP 138* 121* 119*    ABG    Component Value Date/Time   PHART 7.405 05/12/2017 0520   PCO2ART 34.0 05/12/2017 0520   PO2ART 90.1 05/12/2017 0520   HCO3 20.8 05/12/2017 0520   TCO2 22 05/12/2017 1817   ACIDBASEDEF 3.1 (H) 05/12/2017 0520   O2SAT 91.3 05/13/2017 0532    CXR: Not done yet   Assessment/Plan: S/P Procedure(s) (LRB): AORTIC VALVE REPAIR (N/A) TRANSESOPHAGEAL ECHOCARDIOGRAM (TEE) (N/A)  Doing well  POD3 Maintaining NSR w/ stable BP although still somewhat hypertensive Breathing comfortably w/ O2 sats 95-97% on RA Chronic combined systolic and diastolic CHF with expected post-op volume excess, weight down 3 lbs since yesterday CKD stage III with baseline creatinine 1.8 preop, adequate UOP and creatinine back to baseline Expected post op acute blood loss anemia, very mild Expected post op atelectasis, very mild Long-standing hypertension   Mobilize  Diuresis  Restart Entresto  D/C pacing wires  Transfer 4E  Rexene Alberts, MD 05/14/2017 7:52 AM

## 2017-05-14 NOTE — Care Management Note (Signed)
Case Management Note  Patient Details  Name: [name] [name] MRN: [id number] Date of Birth: 03/10/1981  Subjective/Objective: From home with wife who can asst him at discharge  , presents with aortic insufficiency, post op AVR.  Wife will be with him for the firs week, she works, then [employer] and Dad will be with him for the rest of the time to assist him at home.  11/2 1014 [name] RN, BSN - POD 3 AVR,  cont to ambulate, diuresis, will restart entresto which he was taking pta, dc pacing wires.                                   Action/Plan: NCM will follow for dc needs.   Expected Discharge Date:                  Expected Discharge Plan:  Home/Self Care  In-House Referral:     Discharge planning Services  CM Consult  Post Acute Care Choice:    Choice offered to:     DME Arranged:    DME Agency:     HH Arranged:    HH Agency:     Status of Service:  In process, will continue to follow  If discussed at Long Length of Stay Meetings, dates discussed:    Additional Comments:  Zenon Mayo, RN 05/14/2017, 10:23 AM

## 2017-05-14 NOTE — Discharge Summary (Addendum)
Physician Discharge Summary  Patient ID: [name] [name] MRN: [id number] DOB/AGE: 1981/03/18 36 y.o.  Admit date: 05/11/2017 Discharge date: 05/16/2017  Admission Diagnoses: Aortic Insufficiency  Active Diagnoses:  1. Chronic combined systolic and diastolic heart failure ([place]) 2. Essential HTN (hypertension) 3. CKD (chronic kidney disease) stage 3, GFR 30-59 ml/min (HCC) 4. Nonischemic cardiomyopathy ([place]) 5. Obstructive sleep apnea 6. Headache 7. History of pneumonia 8. Noncompliance  Discharged Condition: Stable and discharged to home.  HPI:   Patient is a 36 year old African-American male with history of aortic insufficiency, nonischemic cardiomyopathy with chronic combined systolic and diastolic congestive heart failure, long-standing hypertension with hypertensive heart disease, end-stage 3 chronic kidney disease who has been referred for surgical consultation to discuss treatment options for management of stage D severe symptomatic aortic insufficiency. Patient states that he was first diagnosed with congestive heart failure in 2012.  He was diagnosed with nonischemic cardiomyopathy and hypertension. Left ventricular ejection fraction reportedly improved and heart failure medications were ultimately stopped. The following year he presented with a recurrence of acute systolic congestive heart failure. Ejection fraction had dropped again to 20%. He improved with medical therapy. In 2016 he was hospitalized a third time with hypertensive urgency, elevated troponin levels and congestive heart failure after the patient had run out of his medications. He has been followed for the last several years by Dr. Haroldine Laws in the advanced heart failure clinic. He has remained stable from a medical standpoint with mild symptoms of exertional shortness of breath. However, every time he cut back on diuretics or other medications he develops fairly rapid onset symptoms of exertional shortness of breath  and fluid retention.  He was seen in follow-up recently by Dr. Haroldine Laws and repeat echocardiogram revealed ejection fraction 40% with severe left ventricular hypertrophy and at least moderate aortic insufficiency with mild to moderate left ventricular chamber enlargement. Transesophageal echocardiogram was performed to more carefully evaluate the severity of aortic insufficiency on 04/09/2017. This revealed severe aortic insufficiency. The aortic valve was trileaflet but there was incomplete coaptation of the right coronary noncoronary leaflets. There was no evidence of vegetation no other signs to suggest a history of endocarditis. There was diffuse left ventricular hypokinesis with ejection fraction estimated 35%.  Left and right heart catheterization was performed 04/20/2017. The patient was noted to have normal coronary arteries with no significant coronary artery disease. There was severe aortic insufficiency and compensated filling pressures with PA pressures measured 31/17. Cardiac output was 4.8 L/m corresponding to a cardiac index of 2.2. Mixed venous oxygen saturation 69% and central venous pressure 4 mmHg. The patient was referred for possible elective aortic valve repair.  The patient just recently got married and lives in [place] with his wife. His father lives close by and accompanied the patient to his office visit today. The patient works full-time for [employer] in [place], [place]. His job requires some strenuous physical activity although he is currently on light duty because of a fracture in his right wrist.  He states that recently he has been fairly stable from a cardiac standpoint. He gets short of breath with more strenuous physical exertion but this does not limit him to much. He does not exercise on a regular basis. He has had some occasional vague tightness across his chest both with and without physical activity. He reports occasional palpitations. If he cuts back or  stop taking diuretics he gets short of breath within a few days. He monitors his weight carefully and has  been compliant with medical therapy for a long time now.  Hospital Course:  [name] underwent an aortic valve repair with Dr. Roxy Manns on 05/11/2017. He tolerated the procedure well and was transferred to the ICU without issue. He was extubated in a timely manner. POD 1 he remained hemodynamically stable on low dose dopamine and milrinone. He was breathing comfortably on 4L/min of oxygen. He did have some expected post-op volume overload. He does have CKD with a baseline creatinine of 1.8. He did have some expected post-op atelectasis on chest xray. We initiated a diuretic regimen for fluid overload. We began to mobilize the patient. We started carvedilol for better blood pressure and HR control. POD 2 we were able to discontinue chest tubes and lines. We discontinued milrinone and started oral hydralazine for blood pressure control. We continued to mobilize the patient. POD 3 we removed his epicardial pacing wires. We restarted Entresto. He was progressing nicely but remained somewhat hypertensive. His weight continued to trend down with a good diuretic regimen. He was ready for transfer to 4E for continued care. He is ambulating on room air. He is tolerating a diet and has had a bowel movement. His incisions are healing well. Epicardial pacing wires were removed on 05/14/2017. Chest tube sutures will be removed the day of discharge. He is felt surgically stable for transfer today.  Consults: None  Significant Diagnostic Studies: CLINICAL DATA:  36 year old male status post cardiac surgery.  EXAM: CHEST  2 VIEW  COMPARISON:  05/13/2017 and priors.  FINDINGS: There has been interval removal of life-support devices. Median sternotomy wires noted.  There is stable cardiomegaly with interval improvement in pulmonary vascular congestion and edema.  There is bibasilar atelectasis without  focal opacities. Small residual pleural effusions remain. No sizable pneumothorax.  No acute osseous abnormalities.  IMPRESSION: 1. Interval removal of the life-support devices. 2. Stable cardiomegaly with improvement of pulmonary vascular congestion and edema. 3. Small residual pleural effusions remain.   Electronically Signed   By: Kristopher Oppenheim M.D.   On: 05/14/2017 08:45   Treatments:   CARDIOTHORACIC SURGERY OPERATIVE NOTE  Date of Procedure:                05/11/2017  Preoperative Diagnosis:      Severe Aortic Insufficiency  Postoperative Diagnosis:    Same   Procedure:        Aortic Valve Repair             Complex valvuloplasty including plication of left coronary leaflet Biostable HAART aortic ring annuloplasty(size 66mm, ref# [id number], lot# [id number])                          Surgeon:        Valentina Gu. Roxy Manns, MD  Assistant:       Gaye Pollack, MD  Anesthesia:    Laurie Panda, MD  Operative Findings: ? Isolated prolapse of left coronary leaflet ? Severe aortic insufficiency ? Moderate-severe left ventricular systolic dysfunction ? Trivial residual aortic insufficiency after successful valve repair  Discharge Exam: Blood pressure (!) 143/98, pulse 80, temperature 97.8 F (36.6 C), temperature source Oral, resp. rate 18, height 5\' 10"  (1.778 m), weight 234 lb 3.2 oz (106.2 kg), SpO2 97 %.    Cardiovascular: RRR, no murmur Pulmonary: Slightly diminshed at bases Abdomen: Soft, non tender, bowel sounds present. Extremities: Mild bilateral lower extremity edema. Wounds: Clean and dry.  No erythema or signs of infection.  Chest tube sutures intact but wounds slightly dehisced.  Discharge Instructions    Amb Referral to Cardiac Rehabilitation   Complete by:  As directed    Diagnosis:  Valve Repair   Valve:  Aortic     Allergies as of 05/16/2017   No Known Allergies     Medication List    TAKE these medications    acetaminophen 500 MG tablet Commonly known as:  TYLENOL Take 1,000 mg by mouth every 6 (six) hours as needed for moderate pain or headache.   aspirin 325 MG EC tablet Take 1 tablet (325 mg total) daily by mouth.   carvedilol 25 MG tablet Commonly known as:  COREG Take 1 tablet (25 mg total) by mouth 2 (two) times daily with a meal.   clobetasol cream 0.05 % Commonly known as:  TEMOVATE Apply 1 application topically every other day.   ENTRESTO 97-103 MG Generic drug:  sacubitril-valsartan TAKE 1 TABLET BY MOUTH TWICE DAILY   furosemide 40 MG tablet Commonly known as:  LASIX Take 1 tablet (40 mg total) 2 (two) times daily by mouth.   hydrALAZINE 25 MG tablet Commonly known as:  APRESOLINE Take 1 tablet (25 mg total) by mouth 3 (three) times daily.   oxyCODONE 5 MG immediate release tablet Commonly known as:  Oxy IR/ROXICODONE Take 5 mg by mouth every 4-6 hours PRN severe pain      Follow-up Information    Sharilyn Sites, MD. Call.   Specialty:  Family Medicine Why:  for a follow up appointment regarding further surveillance of HGA1C 6.4. Contact information: [address] [place] [address] [phone]        Bensimhon, Shaune Pascal, MD Follow up.   Specialty:  Cardiology Why:  Your appointment is on 06/28/2017 at 9:00am. Please bring your medication list.  Contact information: [address] [place] [address] [phone]        Rexene Alberts, MD. Go on 06/14/2017.   Specialty:  Cardiothoracic Surgery Why:  PA/LAT CXR to be taken (at [facility] which is in the same building as Dr. Guy Sandifer office) on 06/14/2017 at 3:30 pm;Appointment time is at 4:00 pm Contact information: [address] [place] [address] [phone]        Nurse Follow up on 05/25/2017.   Why:  Office will call with appointment time. Appointment is with nurse for chest tube suture removal Contact information: [address] [place] [address]         The patient has been discharged on:   1.Beta Blocker:  Yes [ x  ]                              No   [   ]                              If No, reason:  2.Ace Inhibitor/ARB: Yes [ x  ]                                     No  [    ]  If No, reason:  3.Statin:   Yes [   ]                  No  [ x  ]                  If No, reason: no CAD  4.S[place]a [place]:  Yes  [ x  ]                  No   [   ]                  If No, reason:   Signed: ZIMMERMAN,DONIELLE M PA-C 05/16/2017, 8:44 AM

## 2017-05-15 LAB — BASIC METABOLIC PANEL
Anion gap: 8 (ref 5–15)
BUN: 17 mg/dL (ref 6–20)
CO2: 25 mmol/L (ref 22–32)
Calcium: 8.3 mg/dL — ABNORMAL LOW (ref 8.9–10.3)
Chloride: 98 mmol/L — ABNORMAL LOW (ref 101–111)
Creatinine, Ser: 1.75 mg/dL — ABNORMAL HIGH (ref 0.61–1.24)
GFR, EST AFRICAN AMERICAN: 56 mL/min — AB (ref >60)
GFR, EST NON AFRICAN AMERICAN: 48 mL/min — AB (ref >60)
Glucose, Bld: 126 mg/dL — ABNORMAL HIGH (ref 65–99)
POTASSIUM: 3.7 mmol/L (ref 3.5–5.1)
SODIUM: 131 mmol/L — AB (ref 135–145)

## 2017-05-15 MED ORDER — POTASSIUM CHLORIDE CRYS ER 20 MEQ PO TBCR
20.0000 meq | EXTENDED_RELEASE_TABLET | Freq: Once | ORAL | Status: AC
  Administered 2017-05-15: 20 meq via ORAL
  Filled 2017-05-15: qty 1

## 2017-05-15 MED ORDER — LACTULOSE 10 GM/15ML PO SOLN
20.0000 g | Freq: Once | ORAL | Status: AC
  Administered 2017-05-15: 20 g via ORAL
  Filled 2017-05-15: qty 30

## 2017-05-15 NOTE — Progress Notes (Signed)
CARDIAC REHAB PHASE I  [phone] Patient ambulating in hallway independently prior to CR. Patient denied complaints with ambulation. Discharge education completed including phase 2 CR, diet/nutrition/ DM carb exchange, activity progression/restriction, and reviewed [facility] booklet. Patient extremely interested in phase 2 cardiac rehab at [facility] and states he has completed the program there in the past. Referral placed. Anticipated discharge tomorrow. Patient encouraged to ambulate 2 more times today.  Lochlin Eppinger English PayneRN, BSN 05/15/2017 1:23 PM

## 2017-05-15 NOTE — Progress Notes (Addendum)
      [address]       [place] [address]             [phone]        4 Days Post-Op Procedure(s) (LRB): AORTIC VALVE REPAIR (N/A) TRANSESOPHAGEAL ECHOCARDIOGRAM (TEE) (N/A)  Subjective: Patient states back of right hand hurts and wants IV removed. Passing flatus but no bowel movement yet.  Objective: Vital signs in last 24 hours: Temp:  [97.6 F (36.4 C)-99.6 F (37.6 C)] 97.6 F (36.4 C) (11/03 0551) Pulse Rate:  [85-98] 85 (11/02 2338) Cardiac Rhythm: Normal sinus rhythm (11/02 2000) Resp:  [17-33] 23 (11/03 0551) BP: (119-161)/(85-109) 138/85 (11/03 0551) SpO2:  [94 %-100 %] 94 % (11/03 0551) Weight:  [232 lb (105.2 kg)] 232 lb (105.2 kg) (11/03 0452)  Pre op weight 103.9 kg Current Weight  05/15/17 232 lb (105.2 kg)      Intake/Output from previous day: 11/02 0701 - 11/03 0700 In: 723 [P.O.:720; I.V.:3] Out: 1400 [Urine:1400]   Physical Exam:  Cardiovascular: RRR, no murmur Pulmonary: Slightly diminshed at bases Abdomen: Soft, non tender, bowel sounds present. Extremities: Mild bilateral lower extremity edema. Wounds: Clean and dry.  No erythema or signs of infection. Chest tube   Lab Results: CBC: Recent Labs  05/13/17 0407 05/14/17 0339  WBC 13.2* 11.2*  HGB 12.1* 12.6*  HCT 36.3* 36.2*  PLT 92* 102*   BMET:  Recent Labs  05/14/17 0339 05/15/17 0430  NA 133* 131*  K 4.1 3.7  CL 99* 98*  CO2 25 25  GLUCOSE 124* 126*  BUN 17 17  CREATININE 1.86* 1.75*  CALCIUM 8.3* 8.3*    PT/INR:  Lab Results  Component Value Date   INR 1.32 05/11/2017   INR 1.03 05/07/2017   INR 0.99 04/20/2017   ABG:  INR: Will add last result for INR, ABG once components are confirmed Will add last 4 CBG results once components are confirmed  Assessment/Plan:  1. CV - SR. On Coreg 25 mg bid, Hydralazine 25 mg tid, and Entresto 97/103 mg bid. 2.  Pulmonary - On room air. Encourage incentive spirometer. 3. Chronic combined systolic  and diastolic CHF-on Lasix 40 mg bid 4.  Acute blood loss anemia - H and H yesterday stable at 12.6 and 36.2 5. CKD stage III with baseline creatinine 1.8 pre op-creatinine this am 1.75 6. Gently supplement potassium 7. Thrombocytopenia-platelets yesterday up to 102,000 8. Pre op HGA1C 6.4. His CBGs in ICU were below 134. He will need further surveillance with medical doctor after discharge. Will include diet recommendations with discharge papers.  9. LOC constipation 10. Hopefully, home in am  ZIMMERMAN,DONIELLE MPA-C 05/15/2017,7:31 AM   I have seen and examined the patient and agree with the assessment and plan as outlined.  Tentatively plan d/c home tomorrow.  Rexene Alberts, MD 05/15/2017 11:43 AM

## 2017-05-15 NOTE — Discharge Instructions (Signed)
Aortic Valve Repair, Care After Refer to this sheet in the next few weeks. These instructions provide you with information about caring for yourself after your procedure. Your health care provider may also give you more specific instructions. Your treatment has been planned according to current medical practices, but problems sometimes occur. Call your health care provider if you have any problems or questions after your procedure. What can I expect after the procedure? After the procedure, it is common to have:  Pain around your incision area.  A small amount of blood or clear fluid coming from your incision.  Follow these instructions at home: Eating and drinking   Follow instructions from your health care provider about eating or drinking restrictions. ? Limit alcohol intake to no more than 1 drink per day for nonpregnant women and 2 drinks per day for men. One drink equals 12 oz of beer, 5 oz of wine, or 1 oz of hard liquor. ? Limit how much caffeine you drink. Caffeine can affect your heart's rate and rhythm.  Drink enough fluid to keep your urine clear or pale yellow.  Eat a heart-healthy diet. This should include plenty of fresh fruits and vegetables. If you eat meat, it should be lean cuts. Avoid foods that are: ? High in salt, saturated fat, or sugar. ? Canned or highly processed. ? Fried. Activity  Return to your normal activities as told by your health care provider. Ask your health care provider what activities are safe for you.  Exercise regularly once you have recovered, as told by your health care provider.  Avoid sitting for more than 2 hours at a time without moving. Get up and move around at least once every 1-2 hours. This helps to prevent blood clots in the legs.  Do not lift anything that is heavier than 10 lb (4.5 kg) until your health care provider approves.  Avoid pushing or pulling things with your arms until your health care provider approves. This includes  pulling on handrails to help you climb stairs. Incision care   Follow instructions from your health care provider about how to take care of your incision. Make sure you: ? Wash your hands with soap and water before you change your bandage (dressing). If soap and water are not available, use hand sanitizer. ? Change your dressing as told by your health care provider. ? Leave stitches (sutures), skin glue, or adhesive strips in place. These skin closures may need to stay in place for 2 weeks or longer. If adhesive strip edges start to loosen and curl up, you may trim the loose edges. Do not remove adhesive strips completely unless your health care provider tells you to do that.  Check your incision area every day for signs of infection. Check for: ? More redness, swelling, or pain. ? More fluid or blood. ? Warmth. ? Pus or a bad smell. Medicines  Take over-the-counter and prescription medicines only as told by your health care provider.  If you were prescribed an antibiotic medicine, take it as told by your health care provider. Do not stop taking the antibiotic even if you start to feel better. Travel  Avoid airplane travel for as long as told by your health care provider.  When you travel, bring a list of your medicines and a record of your medical history with you. Carry your medicines with you. Driving  Ask your health care provider when it is safe for you to drive. Do not drive until your health  care provider approves.  Do not drive or operate heavy machinery while taking prescription pain medicine. Lifestyle   Do not use any tobacco products, such as cigarettes, chewing tobacco, or e-cigarettes. If you need help quitting, ask your health care provider.  Resume sexual activity as told by your health care provider. Do not use medicines for erectile dysfunction unless your health care provider approves, if this applies.  Work with your health care provider to keep your blood  pressure and cholesterol under control, and to manage any other heart conditions that you have.  Maintain a healthy weight. General instructions  Do not take baths, swim, or use a hot tub until your health care provider approves.  Do not strain to have a bowel movement.  Avoid crossing your legs while sitting down.  Check your temperature every day for a fever. A fever may be a sign of infection.  If you are a woman and you plan to become pregnant, talk with your health care provider before you become pregnant.  Wear compression stockings if your health care provider instructs you to do this. These stockings help to prevent blood clots and reduce swelling in your legs.  Tell all health care providers who care for you that you have an artificial (prosthetic) aortic valve. If you have or have had heart disease or endocarditis, tell all health care providers about these conditions as well.  Keep all follow-up visits as told by your health care provider. This is important. Contact a health care provider if:  You develop a skin rash.  You experience sudden, unexplained changes in your weight.  You have more redness, swelling, or pain around your incision.  You have more fluid or blood coming from your incision.  Your incision feels warm to the touch.  You have pus or a bad smell coming from your incision.  You have a fever. Get help right away if:  You develop chest pain that is different from the pain coming from your incision.  You develop shortness of breath or difficulty breathing.  You start to feel light-headed. These symptoms may represent a serious problem that is an emergency. Do not wait to see if the symptoms will go away. Get medical help right away. Call your local emergency services (911 in the [place] Do not drive yourself to the hospital. This information is not intended to replace advice given to you by your health care provider. Make sure you discuss any  questions you have with your health care provider. Document Released: 01/15/2005 Document Revised: 12/05/2015 Document Reviewed: 06/02/2015 Elsevier Interactive Patient Education  2017 [employer]  Prediabetes Eating Plan Prediabetes--also called impaired glucose tolerance or impaired fasting glucose--is a condition that causes blood sugar (blood glucose) levels to be higher than normal. Following a healthy diet can help to keep prediabetes under control. It can also help to lower the risk of type 2 diabetes and heart disease, which are increased in people who have prediabetes. Along with regular exercise, a healthy diet:  Promotes weight loss.  Helps to control blood sugar levels.  Helps to improve the way that the body uses insulin.  What do I need to know about this eating plan?  Use the glycemic index (GI) to plan your meals. The index tells you how quickly a food will raise your blood sugar. Choose low-GI foods. These foods take a longer time to raise blood sugar.  Pay close attention to the amount of carbohydrates in the food that you  eat. Carbohydrates increase blood sugar levels.  Keep track of how many calories you take in. Eating the right amount of calories will help you to achieve a healthy weight. Losing about 7 percent of your starting weight can help to prevent type 2 diabetes.  You may want to follow a Mediterranean diet. This diet includes a lot of vegetables, lean meats or fish, whole grains, fruits, and healthy oils and fats. What foods can I eat? Grains Whole grains, such as whole-wheat or whole-grain breads, crackers, cereals, and pasta. Unsweetened oatmeal. Bulgur. Barley. Quinoa. Brown rice. Corn or whole-wheat flour tortillas or taco shells. Vegetables Lettuce. Spinach. Peas. Beets. Cauliflower. Cabbage. Broccoli. Carrots. Tomatoes. Squash. Eggplant. Herbs. Peppers. Onions. Cucumbers. Brussels sprouts. Fruits Berries. Bananas. Apples. Oranges. Grapes. Papaya.  Mango. Pomegranate. Kiwi. Grapefruit. Cherries. Meats and Other Protein Sources Seafood. Lean meats, such as chicken and [place] or lean cuts of pork and beef. Tofu. Eggs. Nuts. Beans. Dairy Low-fat or fat-free dairy products, such as yogurt, cottage cheese, and cheese. Beverages Water. Tea. Coffee. Sugar-free or diet soda. Seltzer water. Milk. Milk alternatives, such as soy or almond milk. Condiments Mustard. Relish. Low-fat, low-sugar ketchup. Low-fat, low-sugar barbecue sauce. Low-fat or fat-free mayonnaise. Sweets and Desserts Sugar-free or low-fat pudding. Sugar-free or low-fat ice cream and other frozen treats. Fats and Oils Avocado. Walnuts. Olive oil. The items listed above may not be a complete list of recommended foods or beverages. Contact your dietitian for more options. What foods are not recommended? Grains Refined white flour and flour products, such as bread, pasta, snack foods, and cereals. Beverages Sweetened drinks, such as sweet iced tea and soda. Sweets and Desserts Baked goods, such as cake, cupcakes, pastries, cookies, and cheesecake. The items listed above may not be a complete list of foods and beverages to avoid. Contact your dietitian for more information. This information is not intended to replace advice given to you by your health care provider. Make sure you discuss any questions you have with your health care provider. Document Released: 11/13/2014 Document Revised: 12/05/2015 Document Reviewed: 07/25/2014 Elsevier Interactive Patient Education  2017 [employer].

## 2017-05-16 MED ORDER — FUROSEMIDE 40 MG PO TABS: 40.0000 mg | ORAL_TABLET | Freq: Two times a day (BID) | ORAL | 1 refills | Status: DC

## 2017-05-16 MED ORDER — ASPIRIN 325 MG PO TBEC: 325.0000 mg | DELAYED_RELEASE_TABLET | Freq: Every day | ORAL | 0 refills | Status: DC

## 2017-05-16 MED ORDER — OXYCODONE HCL 5 MG PO TABS: ORAL_TABLET | ORAL | 0 refills | Status: DC

## 2017-05-16 NOTE — Progress Notes (Signed)
      [address]       [place] [address]             [phone]        5 Days Post-Op Procedure(s) (LRB): AORTIC VALVE REPAIR (N/A) TRANSESOPHAGEAL ECHOCARDIOGRAM (TEE) (N/A)  Subjective: Patient had a bowel movement yesterday. He just washed up. He wants to go home.  Objective: Vital signs in last 24 hours: Temp:  [97.8 F (36.6 C)-99.4 F (37.4 C)] 97.8 F (36.6 C) (11/04 0346) Pulse Rate:  [80-86] 80 (11/04 0346) Cardiac Rhythm: Normal sinus rhythm (11/03 1900) Resp:  [18-23] 18 (11/04 0346) BP: (120-163)/(87-104) 143/98 (11/04 0346) SpO2:  [96 %-98 %] 97 % (11/04 0346) Weight:  [234 lb 3.2 oz (106.2 kg)] 234 lb 3.2 oz (106.2 kg) (11/04 0346)  Pre op weight 103.9 kg Current Weight  05/16/17 234 lb 3.2 oz (106.2 kg)      Intake/Output from previous day: 11/03 0701 - 11/04 0700 In: 600 [P.O.:600] Out: -    Physical Exam:  Cardiovascular: RRR, no murmur Pulmonary: Slightly diminshed at bases Abdomen: Soft, non tender, bowel sounds present. Extremities:No lower extremity edema. Wounds: Clean and dry.  No erythema or signs of infection.   Lab Results: CBC: Recent Labs    05/14/17 0339  WBC 11.2*  HGB 12.6*  HCT 36.2*  PLT 102*   BMET:  Recent Labs    05/14/17 0339 05/15/17 0430  NA 133* 131*  K 4.1 3.7  CL 99* 98*  CO2 25 25  GLUCOSE 124* 126*  BUN 17 17  CREATININE 1.86* 1.75*  CALCIUM 8.3* 8.3*    PT/INR:  Lab Results  Component Value Date   INR 1.32 05/11/2017   INR 1.03 05/07/2017   INR 0.99 04/20/2017   ABG:  INR: Will add last result for INR, ABG once components are confirmed Will add last 4 CBG results once components are confirmed  Assessment/Plan:  1. CV - SR. On Coreg 25 mg bid, Hydralazine 25 mg tid, and Entresto 97/103 mg bid. 2.  Pulmonary - On room air. Encourage incentive spirometer. 3. Chronic combined systolic and diastolic CHF-on Lasix 40 mg bid 4.  Acute blood loss anemia - Last H and H  stable at 12.6 and 36.2 5. CKD stage III with baseline creatinine 1.8 pre op-creatinine yesterday 1.75 6. Gently supplement potassium 7. Thrombocytopenia-last plateletsup to 102,000 8. Pre op HGA1C 6.4. His CBGs in ICU were below 134. He will need further surveillance with medical doctor after discharge. Will include diet recommendations with discharge papers.  9. Will not remove chest tube sutures-will arrange to remove in office 10. Discharge  ZIMMERMAN,DONIELLE MPA-C 05/16/2017,7:32 AM

## 2017-05-17 ENCOUNTER — Other Ambulatory Visit: Payer: Self-pay | Admitting: *Deleted

## 2017-05-25 ENCOUNTER — Other Ambulatory Visit: Payer: Self-pay

## 2017-05-25 ENCOUNTER — Ambulatory Visit (INDEPENDENT_AMBULATORY_CARE_PROVIDER_SITE_OTHER): Payer: Self-pay

## 2017-05-25 DIAGNOSIS — Z9889 Other specified postprocedural states: Secondary | ICD-10-CM

## 2017-05-25 DIAGNOSIS — Z4802 Encounter for removal of sutures: Secondary | ICD-10-CM

## 2017-05-25 MED ORDER — TRAMADOL HCL 50 MG PO TABS: 50.0000 mg | ORAL_TABLET | ORAL | 0 refills | Status: DC

## 2017-05-25 NOTE — Progress Notes (Signed)
Patient seen today for suture removal.  Two sutures removed.  Patient caregiver has noticed some drainage now and then but nothing concerning.  Advised them to monitor and contact us if it changes.  Patient also request refill oxycodone.  He is out and taking it every 4 to 6 hours.  Tramadol called into [facility].  Patient informed.

## 2017-05-27 ENCOUNTER — Other Ambulatory Visit: Payer: Self-pay

## 2017-05-27 ENCOUNTER — Emergency Department (HOSPITAL_COMMUNITY)
Admission: EM | Admit: 2017-05-27 | Discharge: 2017-05-27 | Disposition: A | Discharge status: Home / Self Care | Payer: BLUE CROSS/BLUE SHIELD | Attending: Emergency Medicine | Admitting: Emergency Medicine

## 2017-05-27 ENCOUNTER — Encounter (HOSPITAL_COMMUNITY): Payer: Self-pay

## 2017-05-27 DIAGNOSIS — R10811 Right upper quadrant abdominal tenderness: Secondary | ICD-10-CM | POA: Diagnosis not present

## 2017-05-27 DIAGNOSIS — Z7982 Long term (current) use of aspirin: Secondary | ICD-10-CM | POA: Insufficient documentation

## 2017-05-27 DIAGNOSIS — I130 Hypertensive heart and chronic kidney disease with heart failure and stage 1 through stage 4 chronic kidney disease, or unspecified chronic kidney disease: Secondary | ICD-10-CM | POA: Diagnosis not present

## 2017-05-27 DIAGNOSIS — N182 Chronic kidney disease, stage 2 (mild): Secondary | ICD-10-CM | POA: Diagnosis not present

## 2017-05-27 DIAGNOSIS — I5022 Chronic systolic (congestive) heart failure: Secondary | ICD-10-CM | POA: Diagnosis not present

## 2017-05-27 DIAGNOSIS — Z5189 Encounter for other specified aftercare: Secondary | ICD-10-CM | POA: Diagnosis not present

## 2017-05-27 DIAGNOSIS — Z79899 Other long term (current) drug therapy: Secondary | ICD-10-CM | POA: Diagnosis not present

## 2017-05-27 DIAGNOSIS — R10812 Left upper quadrant abdominal tenderness: Secondary | ICD-10-CM | POA: Diagnosis not present

## 2017-05-27 DIAGNOSIS — Z952 Presence of prosthetic heart valve: Secondary | ICD-10-CM | POA: Insufficient documentation

## 2017-05-27 DIAGNOSIS — Z4800 Encounter for change or removal of nonsurgical wound dressing: Secondary | ICD-10-CM | POA: Diagnosis not present

## 2017-05-27 LAB — CBC WITH DIFFERENTIAL/PLATELET
BASOS ABS: 0 10*3/uL (ref 0.0–0.1)
BASOS PCT: 1 %
Eosinophils Absolute: 0.4 10*3/uL (ref 0.0–0.7)
Eosinophils Relative: 7 %
HEMATOCRIT: 36.3 % — AB (ref 39.0–52.0)
Hemoglobin: 12.2 g/dL — ABNORMAL LOW (ref 13.0–17.0)
LYMPHS PCT: 25 %
Lymphs Abs: 1.3 10*3/uL (ref 0.7–4.0)
MCH: 28 pg (ref 26.0–34.0)
MCHC: 33.6 g/dL (ref 30.0–36.0)
MCV: 83.3 fL (ref 78.0–100.0)
Monocytes Absolute: 0.3 10*3/uL (ref 0.1–1.0)
Monocytes Relative: 5 %
NEUTROS ABS: 3.2 10*3/uL (ref 1.7–7.7)
Neutrophils Relative %: 62 %
PLATELETS: 373 10*3/uL (ref 150–400)
RBC: 4.36 MIL/uL (ref 4.22–5.81)
RDW: 13.4 % (ref 11.5–15.5)
WBC: 5.1 10*3/uL (ref 4.0–10.5)

## 2017-05-27 LAB — COMPREHENSIVE METABOLIC PANEL
ALBUMIN: 3.4 g/dL — AB (ref 3.5–5.0)
ALK PHOS: 73 U/L (ref 38–126)
ALT: 24 U/L (ref 17–63)
ANION GAP: 8 (ref 5–15)
AST: 20 U/L (ref 15–41)
BILIRUBIN TOTAL: 0.5 mg/dL (ref 0.3–1.2)
BUN: 17 mg/dL (ref 6–20)
CALCIUM: 9.2 mg/dL (ref 8.9–10.3)
CO2: 22 mmol/L (ref 22–32)
Chloride: 104 mmol/L (ref 101–111)
Creatinine, Ser: 1.42 mg/dL — ABNORMAL HIGH (ref 0.61–1.24)
Glucose, Bld: 100 mg/dL — ABNORMAL HIGH (ref 65–99)
POTASSIUM: 4 mmol/L (ref 3.5–5.1)
Sodium: 134 mmol/L — ABNORMAL LOW (ref 135–145)
TOTAL PROTEIN: 7.4 g/dL (ref 6.5–8.1)

## 2017-05-27 NOTE — ED Notes (Signed)
Patient is resting comfortably. 

## 2017-05-27 NOTE — ED Notes (Signed)
Family at bedside. 

## 2017-05-27 NOTE — Discharge Instructions (Signed)
Continue to clean the area with mild soap and water.  Keep it bandaged.  Return to the ER for any increasing pain, fever, chills, or purulent drainage.

## 2017-05-27 NOTE — ED Provider Notes (Signed)
[facility] EMERGENCY DEPARTMENT Provider Note   CSN: [id number] Arrival date & time: 05/27/17  7673     History   Chief Complaint Chief Complaint  Patient presents with  . Wound Check    HPI [name] [name] is a 36 y.o. male.  HPI  [name] [name] is a 36 y.o. male who presents to the Emergency Department complaining of drainage from two surgical incisions of the upper abdomen.  patient is 2 weeks s/p aortic valve repair by Dr. Roxy Manns in [place].  States he had the stitches removed two days ago, but he is concerned because the incisions are draining bloody to clear fluid.  He reports some discomfort of the upper abdomen, but mainly associated with palpation.  He denies fever, chills, swelling, surrounding redness, diarrhea or constipation and vomiting. No chest pain or shortness of breath  Past Medical History:  Diagnosis Date  . Anxiety   . CHF (congestive heart failure) ([place])   . Chronic systolic heart failure ([place])   . CKD (chronic kidney disease) stage 2, GFR 60-89 ml/min   . Dyspnea    with value issues- "if I dont take my medication"  . Essential hypertension, benign   . Headache   . History of pneumonia   . Mitral regurgitation    Moderate  . Noncompliance   . Nonischemic cardiomyopathy ([place])    Normal coronaries May 2012, LVEF < 20%  . Pneumonia 2011  . S/P aortic valve repair 05/11/2017   Complex valvuloplasty including plication of left coronary leaflet and 82mm Biostable HAART ring annuloplasty    Patient Active Problem List   Diagnosis Date Noted  . S/P aortic valve repair 05/11/2017  . Aortic valve regurgitation   . Chronic systolic CHF (congestive heart failure) ([place])   . Hypertensive emergency 05/30/2015  . Essential hypertension 11/22/2014  . Shift work sleep disorder 10/30/2014  . Obstructive sleep apnea 10/30/2014  . Hypertensive crisis 09/26/2014  . Hypertensive urgency 06/13/2013  . CHF (congestive heart failure) ([place]) 06/13/2013  .  CKD (chronic kidney disease) stage 3, GFR 30-59 ml/min ([facility]) 06/13/2013  . Nonischemic cardiomyopathy ([place]) 06/13/2013  . HTN (hypertension) 02/25/2012  . Chronic combined systolic and diastolic heart failure ([place]) 01/08/2012  . Elevated troponin 01/01/2012  . Hypokalemia 12/31/2011  . Chest pain 12/31/2011    Past Surgical History:  Procedure Laterality Date  . AORTIC VALVE REPAIR N/A 05/11/2017   Procedure: AORTIC VALVE REPAIR;  Surgeon: Rexene Alberts, MD;  Location: [facility];  Service: Open Heart Surgery;  Laterality: N/A;  . RIGHT/LEFT HEART CATH AND CORONARY ANGIOGRAPHY N/A 04/20/2017   Procedure: RIGHT/LEFT HEART CATH AND CORONARY ANGIOGRAPHY;  Surgeon: Jolaine Artist, MD;  Location: [facility];  Service: Cardiovascular;  Laterality: N/A;  . SURGERY SCROTAL / TESTICULAR     Testicular torsion  . TEE WITHOUT CARDIOVERSION N/A 04/09/2017   Procedure: TRANSESOPHAGEAL ECHOCARDIOGRAM (TEE);  Surgeon: Jolaine Artist, MD;  Location: [facility] ENDOSCOPY;  Service: Cardiovascular;  Laterality: N/A;  . TEE WITHOUT CARDIOVERSION N/A 05/11/2017   Procedure: TRANSESOPHAGEAL ECHOCARDIOGRAM (TEE);  Surgeon: Rexene Alberts, MD;  Location: [facility];  Service: Open Heart Surgery;  Laterality: N/A;       Home Medications    Prior to Admission medications   Medication Sig Start Date End Date Taking? Authorizing Provider  acetaminophen (TYLENOL) 500 MG tablet Take 1,000 mg by mouth every 6 (six) hours as needed for moderate pain or headache.     [provider]  aspirin EC 325 MG EC tablet Take 1 tablet (325 mg total) daily by mouth. 05/16/17   Nani Skillern, PA-C  carvedilol (COREG) 25 MG tablet Take 1 tablet (25 mg total) by mouth 2 (two) times daily with a meal. 11/18/16   Bensimhon, Shaune Pascal, MD  clobetasol cream (TEMOVATE) 7.03 % Apply 1 application topically every other day.    [provider]  ENTRESTO 97-103 MG TAKE 1 TABLET BY MOUTH TWICE DAILY 05/06/17    Bensimhon, Shaune Pascal, MD  furosemide (LASIX) 40 MG tablet Take 1 tablet (40 mg total) 2 (two) times daily by mouth. 05/16/17   Nani Skillern, PA-C  hydrALAZINE (APRESOLINE) 25 MG tablet Take 1 tablet (25 mg total) by mouth 3 (three) times daily. 04/01/17 06/30/17  Bensimhon, Shaune Pascal, MD  oxyCODONE (OXY IR/ROXICODONE) 5 MG immediate release tablet Take 5 mg by mouth every 4-6 hours PRN severe pain 05/16/17   Nani Skillern, PA-C  traMADol (ULTRAM) 50 MG tablet Take 1 tablet (50 mg total) as directed by mouth. Take 1-2 tab every 6 hours as needed for pain, no more than 10 tablets a day 05/25/17   Rexene Alberts, MD    Family History Family History  Problem Relation Age of Onset  . Breast cancer Mother   . Stroke Father     Social History Social History   Tobacco Use  . Smoking status: Never Smoker  . Smokeless tobacco: Never Used  Substance Use Topics  . Alcohol use: Yes    Alcohol/week: 0.0 oz    Comment: Occasionally  . Drug use: No     Allergies   Patient has no known allergies.   Review of Systems Review of Systems  Constitutional: Negative for chills and fever.  Respiratory: Negative for cough, chest tightness and shortness of breath.   Cardiovascular: Negative for chest pain.  Gastrointestinal: Negative for constipation, diarrhea, nausea and vomiting.  Musculoskeletal: Negative for arthralgias, back pain and joint swelling.  Skin: Positive for wound.  Neurological: Negative for dizziness, weakness and numbness.  Hematological: Does not bruise/bleed easily.  All other systems reviewed and are negative.    Physical Exam Updated Vital Signs BP 131/89 (BP Location: Right Arm)   Pulse 71   Temp 98 F (36.7 C) (Oral)   Resp 18   Ht 5\' 10"  (1.778 m)   Wt 99.8 kg (220 lb)   SpO2 98%   BMI 31.57 kg/m   Physical Exam  Constitutional: He is oriented to person, place, and time. He appears well-developed and well-nourished. No distress.  HENT:  Head:  Normocephalic and atraumatic.  Cardiovascular: Normal rate, regular rhythm and intact distal pulses.  Pulmonary/Chest: Effort normal and breath sounds normal. No respiratory distress.  Abdominal: Soft. Bowel sounds are normal. He exhibits no mass. There is tenderness. There is no rebound and no guarding.  minimal ttp of the upper abdomen.  No guarding or rebound   Musculoskeletal: He exhibits no edema or tenderness.  Neurological: He is alert and oriented to person, place, and time. He exhibits normal muscle tone. Coordination normal.  Skin: Skin is warm. Laceration noted. No erythema.  Two small incisions to the upper mid abdomen.  No surrounding erythema, no drainage noted.     Nursing note and vitals reviewed.    ED Treatments / Results  Labs (all labs ordered are listed, but only abnormal results are displayed) Labs Reviewed  COMPREHENSIVE METABOLIC PANEL - Abnormal; Notable for the  following components:      Result Value   Sodium 134 (*)    Glucose, Bld 100 (*)    Creatinine, Ser 1.42 (*)    Albumin 3.4 (*)    All other components within normal limits  CBC WITH DIFFERENTIAL/PLATELET - Abnormal; Notable for the following components:   Hemoglobin 12.2 (*)    HCT 36.3 (*)    All other components within normal limits    EKG  EKG Interpretation None       Radiology No results found.  Procedures Procedures (including critical care time)  Medications Ordered in ED Medications - No data to display   Initial Impression / Assessment and Plan / ED Course  I have reviewed the triage vital signs and the nursing notes.  Pertinent labs & imaging results that were available during my care of the patient were reviewed by me and considered in my medical decision making (see chart for details).     Labs or reassuring.  Patient is well-appearing and nontoxic.  He complains of clear to bloody drainage from the surgical wounds which may be related to a possible seroma.  I have  discussed the possibility of a developing abscess and ordered a CT of his abdomen.  Patient declines the CT at this time stating that he prefers to observe the wound for a few more days and will return if his symptoms worsen or the drainage increases. There is no concerning sx's for cellulitis and incisions do not appear infected.   Final Clinical Impressions(s) / ED Diagnoses   Final diagnoses:  Visit for wound check    ED Discharge Orders    None       Kem Parkinson, PA-C 05/28/17 2156    Francine Graven, DO 05/31/17 2116

## 2017-05-27 NOTE — ED Triage Notes (Signed)
Pt reports had open heart surgery 2 weeks ago and had 2 incisions on upper abd.  Reports had stitches out Tuesday but area is draining.

## 2017-05-27 NOTE — ED Notes (Signed)
Vital signs stable. 

## 2017-05-28 LAB — ECHO TEE
AOASC: 3.8 cm
ECHOEF: 10 %
LVOTD: 25 mm
Mean grad: 1 mmHg
SINUS: 3.7 cm
STJ: 3.2 cm

## 2017-06-11 ENCOUNTER — Other Ambulatory Visit: Payer: Self-pay | Admitting: Thoracic Surgery (Cardiothoracic Vascular Surgery)

## 2017-06-11 DIAGNOSIS — Z9889 Other specified postprocedural states: Secondary | ICD-10-CM

## 2017-06-14 ENCOUNTER — Ambulatory Visit (INDEPENDENT_AMBULATORY_CARE_PROVIDER_SITE_OTHER): Payer: Self-pay | Admitting: Thoracic Surgery (Cardiothoracic Vascular Surgery)

## 2017-06-14 ENCOUNTER — Encounter: Payer: Self-pay | Admitting: Thoracic Surgery (Cardiothoracic Vascular Surgery)

## 2017-06-14 ENCOUNTER — Ambulatory Visit
Admission: RE | Admit: 2017-06-14 | Discharge: 2017-06-14 | Disposition: A | Discharge status: Home / Self Care | Payer: BLUE CROSS/BLUE SHIELD | Source: Ambulatory Visit | Attending: Thoracic Surgery (Cardiothoracic Vascular Surgery) | Admitting: Thoracic Surgery (Cardiothoracic Vascular Surgery)

## 2017-06-14 ENCOUNTER — Other Ambulatory Visit: Payer: Self-pay

## 2017-06-14 VITALS — BP 136/90 | HR 80 | Ht 70.0 in | Wt 229.0 lb

## 2017-06-14 DIAGNOSIS — Z9889 Other specified postprocedural states: Secondary | ICD-10-CM

## 2017-06-14 DIAGNOSIS — J90 Pleural effusion, not elsewhere classified: Secondary | ICD-10-CM | POA: Diagnosis not present

## 2017-06-14 NOTE — Progress Notes (Signed)
[address]       ,[place] [address]             [phone]     CARDIOTHORACIC SURGERY OFFICE NOTE  Referring Provider is Bensimhon, Shaune Pascal, MD PCP is Sharilyn Sites, MD   HPI:  Patient is a 36 year old African-American male with history of aortic insufficiency, nonischemic cardiomyopathy with chronic combined systolic and diastolic congestive heart failure, long-standing hypertension with hypertensive heart disease, and chronic kidney disease who returns the office today for routine follow-up status post aortic valve repair on May 11, 2017.  The patient's early postoperative recovery in the hospital was uneventful and he was discharged home on the fourth postoperative day.  Since hospital discharge she was seen in the emergency room because of concern from drainage from his 2 chest tube incisions.  He was told that there did not appear to be any significant infection but CT scan was recommended.  The patient decided not to undergo CT scan.  He returns to our office today for routine follow-up.  He has not yet been seen in follow-up in the advanced heart failure clinic.  He reports that overall he is doing quite well.  He still has significant soreness in the chest but this continues to gradually improve.  He is no longer taking any sort of pain relievers other than occasional Tylenol.  He denies any shortness of breath.  His activity level is improving.  He is sleeping well at night.  Appetite is good.  Weight is continued to gradually trend down since hospital discharge and has been stable at 215 pounds over the past few days.   Current Outpatient Medications  Medication Sig Dispense Refill  . acetaminophen (TYLENOL) 500 MG tablet Take 1,000 mg by mouth every 6 (six) hours as needed for moderate pain or headache.     Marland Kitchen aspirin EC 325 MG EC tablet Take 1 tablet (325 mg total) daily by mouth. 30 tablet 0  . carvedilol (COREG) 25 MG tablet Take 1 tablet (25 mg  total) by mouth 2 (two) times daily with a meal. 60 tablet 6  . clobetasol cream (TEMOVATE) 4.12 % Apply 1 application topically every other day.    Marland Kitchen ENTRESTO 97-103 MG TAKE 1 TABLET BY MOUTH TWICE DAILY 60 tablet 3  . furosemide (LASIX) 40 MG tablet Take 1 tablet (40 mg total) 2 (two) times daily by mouth. 60 tablet 1  . hydrALAZINE (APRESOLINE) 25 MG tablet Take 1 tablet (25 mg total) by mouth 3 (three) times daily. 90 tablet 3  . mupirocin ointment (BACTROBAN) 2 %     . oxyCODONE (OXY IR/ROXICODONE) 5 MG immediate release tablet Take 5 mg by mouth every 4-6 hours PRN severe pain 30 tablet 0  . traMADol (ULTRAM) 50 MG tablet Take 1 tablet (50 mg total) as directed by mouth. Take 1-2 tab every 6 hours as needed for pain, no more than 10 tablets a day 60 tablet 0   No current facility-administered medications for this visit.       Physical Exam:   BP 136/90 (BP Location: Left Arm, Patient Position: Sitting, Cuff Size: Large)   Pulse 80   Ht 5\' 10"  (1.778 m)   Wt 229 lb (103.9 kg)   SpO2 98%   BMI 32.86 kg/m   General:  Well-appearing  Chest:   Clear to auscultation  CV:   Regular rate and rhythm with soft diastolic murmur at left lower  sternal border  Incisions:  Healing nicely, sternum is stable  Abdomen:  Soft nontender  Extremities:  Warm and well perfused  Diagnostic Tests:  CHEST  2 VIEW  COMPARISON:  05/14/2017  FINDINGS: There has been resolution of the atelectasis and effusion since the prior study. No pneumothorax. Lungs are clear. Chronic mild cardiomegaly. Pulmonary vascularity is normal. Aortic ring annuloplasty device is visible.  IMPRESSION: 1. No acute abnormalities. 2. Chronic mild cardiomegaly. 3. Resolution of atelectasis and effusions.   Electronically Signed   By: Lorriane Shire M.D.   On: 06/14/2017 16:27    Impression:  Patient is doing well approximately 1 month status post aortic valve repair.  Plan:  I have encouraged the  patient to continue to gradually increase his physical activity as tolerated but I reminded him to refrain from any sort of heavy lifting or strenuous use of his arms or shoulders for at least another 2 months.  We have discussed the possible timing for return to work.  I have encouraged him to enroll and participate in outpatient cardiac rehab program.  We have not recommended any changes to his current medications.  I do think he should undergo routine follow-up echocardiogram within the next month or so.  He has not yet been seen in follow-up in the advanced heart failure clinic and I suspect it would make sense to get an echocardiogram at that time to reassess LV function and the status of his aortic valve repair.  The patient will return to our office for routine follow-up in approximately 2 months.  We will review the results of his routine follow-up echocardiogram at that time.      Valentina Gu. Roxy Manns, MD 06/14/2017 4:32 PM

## 2017-06-14 NOTE — Patient Instructions (Signed)
Continue to avoid any heavy lifting or strenuous use of your arms or shoulders for at least a total of three months from the time of surgery.  After three months you may gradually increase how much you lift or otherwise use your arms or chest as tolerated, with limits based upon whether or not activities lead to the return of significant discomfort.  You are encouraged to enroll and participate in the outpatient cardiac rehab program beginning as soon as practical.  You may return to driving an automobile as long as you are no longer requiring oral narcotic pain relievers during the daytime.  It would be wise to start driving only short distances during the daylight and gradually increase from there as you feel comfortable.  Continue all previous medications without any changes at this time

## 2017-06-15 NOTE — Addendum Note (Signed)
Addended by: Amado Coe on: 06/15/2017 09:17 AM   Modules accepted: Orders

## 2017-06-16 ENCOUNTER — Other Ambulatory Visit (HOSPITAL_COMMUNITY): Payer: Self-pay

## 2017-06-16 ENCOUNTER — Telehealth (HOSPITAL_COMMUNITY): Payer: Self-pay | Admitting: Vascular Surgery

## 2017-06-16 DIAGNOSIS — I5042 Chronic combined systolic (congestive) and diastolic (congestive) heart failure: Secondary | ICD-10-CM

## 2017-06-16 NOTE — Telephone Encounter (Signed)
Left pt message to make echo appt before appt w/ DB 12/17, asked pt if he wants to get echo done @ [facility].

## 2017-06-21 ENCOUNTER — Other Ambulatory Visit (HOSPITAL_COMMUNITY): Payer: BLUE CROSS/BLUE SHIELD

## 2017-06-23 ENCOUNTER — Ambulatory Visit (HOSPITAL_COMMUNITY)
Admission: RE | Admit: 2017-06-23 | Discharge: 2017-06-23 | Disposition: A | Discharge status: Home / Self Care | Payer: BLUE CROSS/BLUE SHIELD | Source: Ambulatory Visit | Attending: Internal Medicine | Admitting: Internal Medicine

## 2017-06-23 ENCOUNTER — Ambulatory Visit (HOSPITAL_COMMUNITY): Payer: BLUE CROSS/BLUE SHIELD

## 2017-06-23 DIAGNOSIS — I5042 Chronic combined systolic (congestive) and diastolic (congestive) heart failure: Secondary | ICD-10-CM | POA: Diagnosis not present

## 2017-06-23 DIAGNOSIS — Q2545 Double aortic arch: Secondary | ICD-10-CM | POA: Insufficient documentation

## 2017-06-23 DIAGNOSIS — I110 Hypertensive heart disease with heart failure: Secondary | ICD-10-CM | POA: Insufficient documentation

## 2017-06-23 DIAGNOSIS — Z9889 Other specified postprocedural states: Secondary | ICD-10-CM | POA: Diagnosis not present

## 2017-06-23 DIAGNOSIS — I429 Cardiomyopathy, unspecified: Secondary | ICD-10-CM | POA: Insufficient documentation

## 2017-06-23 DIAGNOSIS — G4733 Obstructive sleep apnea (adult) (pediatric): Secondary | ICD-10-CM | POA: Insufficient documentation

## 2017-06-23 LAB — ECHOCARDIOGRAM COMPLETE
AOPV: 0.35 m/s
AOVTI: 45.4 cm
AV Area VTI: 1.22 cm2
AV VEL mean LVOT/AV: 0.38
AV area mean vel ind: 0.6 cm2/m2
AV vel: 1.24
AVA: 1.24 cm2
AVAREAMEANV: 1.33 cm2
AVAREAVTIIND: 0.56 cm2/m2
AVG: 14 mmHg
AVLVOTPG: 3 mmHg
AVPG: 26 mmHg
AVPKVEL: 253 cm/s
CHL CUP AV PEAK INDEX: 0.55
CHL CUP AV VALUE AREA INDEX: 0.56
CHL CUP TV REG PEAK VELOCITY: 157 cm/s
DOP CAL AO MEAN VELOCITY: 166 cm/s
E decel time: 211 msec
EERAT: 7.6
FS: 28 % (ref 28–44)
IVS/LV PW RATIO, ED: 0.92
LA diam end sys: 43 mm
LA diam index: 1.95 cm/m2
LA vol: 69.2 mL
LASIZE: 43 mm
LAVOLA4C: 71.4 mL
LAVOLIN: 31.3 mL/m2
LDCA: 3.46 cm2
LV E/e' medial: 7.6
LV TDI E'MEDIAL: 5.77
LV dias vol: 134 mL (ref 62–150)
LV sys vol index: 29 mL/m2
LV sys vol: 64 mL — AB (ref 21–61)
LVDIAVOLIN: 61 mL/m2
LVEEAVG: 7.6
LVELAT: 7.29 cm/s
LVOT VTI: 16.3 cm
LVOT diameter: 21 mm
LVOT peak VTI: 0.36 cm
LVOT peak vel: 89.1 cm/s
LVOTSV: 56 mL
MV Dec: 211
MV pk A vel: 40.1 m/s
MV pk E vel: 55.4 m/s
P 1/2 time: 932 ms
PW: 16.6 mm — AB (ref 0.6–1.1)
RV LATERAL S' VELOCITY: 6.31 cm/s
RV TAPSE: 10.2 mm
Simpson's disk: 53
Stroke v: 71 ml
TDI e' lateral: 7.29
TRMAXVEL: 157 cm/s

## 2017-06-23 NOTE — Progress Notes (Signed)
*  PRELIMINARY RESULTS* Echocardiogram 2D Echocardiogram has been performed.  [name] [name] 06/23/2017, 2:12 PM

## 2017-06-28 ENCOUNTER — Ambulatory Visit (HOSPITAL_COMMUNITY)
Admission: RE | Admit: 2017-06-28 | Discharge: 2017-06-28 | Disposition: A | Discharge status: Home / Self Care | Payer: BLUE CROSS/BLUE SHIELD | Source: Ambulatory Visit | Attending: Internal Medicine | Admitting: Internal Medicine

## 2017-06-28 ENCOUNTER — Encounter (HOSPITAL_COMMUNITY): Payer: Self-pay | Admitting: Internal Medicine

## 2017-06-28 VITALS — BP 136/88 | HR 72 | Wt 224.1 lb

## 2017-06-28 DIAGNOSIS — H532 Diplopia: Secondary | ICD-10-CM | POA: Insufficient documentation

## 2017-06-28 DIAGNOSIS — N183 Chronic kidney disease, stage 3 (moderate): Secondary | ICD-10-CM | POA: Insufficient documentation

## 2017-06-28 DIAGNOSIS — Z79899 Other long term (current) drug therapy: Secondary | ICD-10-CM | POA: Diagnosis not present

## 2017-06-28 DIAGNOSIS — I5022 Chronic systolic (congestive) heart failure: Secondary | ICD-10-CM | POA: Insufficient documentation

## 2017-06-28 DIAGNOSIS — Z9889 Other specified postprocedural states: Secondary | ICD-10-CM | POA: Diagnosis not present

## 2017-06-28 DIAGNOSIS — I340 Nonrheumatic mitral (valve) insufficiency: Secondary | ICD-10-CM | POA: Insufficient documentation

## 2017-06-28 DIAGNOSIS — I10 Essential (primary) hypertension: Secondary | ICD-10-CM

## 2017-06-28 DIAGNOSIS — I351 Nonrheumatic aortic (valve) insufficiency: Secondary | ICD-10-CM

## 2017-06-28 DIAGNOSIS — Z7982 Long term (current) use of aspirin: Secondary | ICD-10-CM | POA: Insufficient documentation

## 2017-06-28 DIAGNOSIS — Z9114 Patient's other noncompliance with medication regimen: Secondary | ICD-10-CM | POA: Diagnosis not present

## 2017-06-28 DIAGNOSIS — I429 Cardiomyopathy, unspecified: Secondary | ICD-10-CM | POA: Diagnosis not present

## 2017-06-28 DIAGNOSIS — R0683 Snoring: Secondary | ICD-10-CM | POA: Diagnosis not present

## 2017-06-28 DIAGNOSIS — R51 Headache: Secondary | ICD-10-CM | POA: Insufficient documentation

## 2017-06-28 DIAGNOSIS — I130 Hypertensive heart and chronic kidney disease with heart failure and stage 1 through stage 4 chronic kidney disease, or unspecified chronic kidney disease: Secondary | ICD-10-CM | POA: Insufficient documentation

## 2017-06-28 LAB — BASIC METABOLIC PANEL
ANION GAP: 8 (ref 5–15)
BUN: 15 mg/dL (ref 6–20)
CHLORIDE: 103 mmol/L (ref 101–111)
CO2: 24 mmol/L (ref 22–32)
Calcium: 9.1 mg/dL (ref 8.9–10.3)
Creatinine, Ser: 1.56 mg/dL — ABNORMAL HIGH (ref 0.61–1.24)
GFR, EST NON AFRICAN AMERICAN: 56 mL/min — AB (ref >60)
Glucose, Bld: 118 mg/dL — ABNORMAL HIGH (ref 65–99)
POTASSIUM: 4.3 mmol/L (ref 3.5–5.1)
SODIUM: 135 mmol/L (ref 135–145)

## 2017-06-28 NOTE — Progress Notes (Signed)
Advanced Heart Failure Clinic Note   Patient ID: [name] [name], male   DOB: 1980/09/21, 37 y.o.   MRN: [id number]  Primary PCP: [place]  Primary Cardiologist: Dr. Haroldine Laws  HPI: [name] [name] is a 36 y.o.male with systolic HF due to  NICM, hypertension and CKD stage II-III (baseline Cr 1.5-1.7).   He was first diagnosed with HF in 2012 and his EF recovered and was instructed to stop taking carvedilol, spironolactone, and lasix by previous cardiologist. Admitted to [facility] 0/17/51 for acute systolic heart failure. 12/31/11 ProBNP 3777. HIV nonreactive. Thyroid panel normal. Renal ultrasound was negative for obstruction. EF 20%. Discharge weight 193 lbs. Cath with normal coronaries.  Admitted 05/30/2015 with HTN urgency with troponin elevation likely consistent with myocardial strain/demand ischemia in the setting of medication non compliance, having run out 2-3 weeks ago and not refilling them. Placed back on his coreg, hydralazine, and spiro and was symptomatically stable on discharge. No ACE/ARB with CKD and does not tolerate nitrates with headaches. Discharge weight 193 lbs.  Underwent TEE in 9/18 with severe AI. EF 40-45%. Cath with normal coronaries. S/P AV repair 05/11/2017 with Dr. Roxy Manns.  PYP scan negative 10/18  Today he returns for HF follow up. Over the last year has been having double vision about once every other week. Overall feeling fine. Denies SOB/PND/Orthopnea. Appetite ok. No fever or chills. Weight at home 215-216 pounds. Taking all medications. Not walking much but will start cardiac rehab this week. Does not need pain medications. SBP 120-130s  ECHO 06/13/13: EF 20-25% ECHO 09/26/2014: EF 40%  ECHO 05/31/15 EF 20-25% Echo 11/17 EF 45-50% ECHO 03/2017 EF ~45% Severe AI  ECHO 06/23/2017: EF 45-50% . Grade II DD Left ventricle: The cavity size was normal. Systolic function was   mildly reduced. The estimated ejection fraction was in the range   of 45% to 50%. Diffuse  hypokinesis. Features are consistent with   a pseudonormal left ventricular filling pattern, with concomitant   abnormal relaxation and increased filling pressure (grade 2   diastolic dysfunction). Moderate to severe concentric left   ventricular hypertrophy. - Aortic valve: S/p aortic valve repair (Biostable HAART aortic   ring annuloplasty). There was moderate regurgitation, best seen   on apical 3 and 4 chamber views. Valve area (VTI): 1.24 cm^2.   Valve area (Vmax): 1.22 cm^2. Valve area (Vmean): 1.33 cm^2. - Aorta: Mild to moderate aortic root dilatation. - Right ventricle: Systolic function was moderately reduced.    Review of systems complete and found to be negative unless listed in HPI.    Past Medical History:  Diagnosis Date  . Anxiety   . CHF (congestive heart failure) ([place])   . Chronic systolic heart failure ([place])   . CKD (chronic kidney disease) stage 2, GFR 60-89 ml/min   . Dyspnea    with value issues- "if I dont take my medication"  . Essential hypertension, benign   . Headache   . History of pneumonia   . Mitral regurgitation    Moderate  . Noncompliance   . Nonischemic cardiomyopathy ([place])    Normal coronaries May 2012, LVEF < 20%  . Pneumonia 2011  . S/P aortic valve repair 05/11/2017   Complex valvuloplasty including plication of left coronary leaflet and 61mm Biostable HAART ring annuloplasty    Current Outpatient Medications  Medication Sig Dispense Refill  . acetaminophen (TYLENOL) 500 MG tablet Take 1,000 mg by mouth every 6 (six) hours as needed for moderate pain or headache.     Marland Kitchen  aspirin EC 325 MG EC tablet Take 1 tablet (325 mg total) daily by mouth. 30 tablet 0  . carvedilol (COREG) 25 MG tablet Take 1 tablet (25 mg total) by mouth 2 (two) times daily with a meal. 60 tablet 6  . clobetasol cream (TEMOVATE) 1.30 % Apply 1 application topically every other day.    Marland Kitchen ENTRESTO 97-103 MG TAKE 1 TABLET BY MOUTH TWICE DAILY 60 tablet 3  . furosemide  (LASIX) 40 MG tablet Take 1 tablet (40 mg total) 2 (two) times daily by mouth. (Patient taking differently: Take 40 mg by mouth daily. ) 60 tablet 1  . hydrALAZINE (APRESOLINE) 25 MG tablet Take 1 tablet (25 mg total) by mouth 3 (three) times daily. 90 tablet 3  . mupirocin ointment (BACTROBAN) 2 %     . oxyCODONE (OXY IR/ROXICODONE) 5 MG immediate release tablet Take 5 mg by mouth every 4-6 hours PRN severe pain 30 tablet 0  . traMADol (ULTRAM) 50 MG tablet Take 1 tablet (50 mg total) as directed by mouth. Take 1-2 tab every 6 hours as needed for pain, no more than 10 tablets a day 60 tablet 0   No current facility-administered medications for this encounter.     Vitals:   06/28/17 0901  BP: 136/88  Pulse: 72  SpO2: 97%  Weight: 224 lb 1.9 oz (101.7 kg)   Wt Readings from Last 3 Encounters:  06/28/17 224 lb 1.9 oz (101.7 kg)  06/14/17 229 lb (103.9 kg)  05/27/17 220 lb (99.8 kg)    PHYSICAL EXAM: General:  Well appearing. No resp difficulty HEENT: normal Neck: supple. no JVD. Carotids 2+ bilat; no bruits. No lymphadenopathy or thryomegaly appreciated. Cor: PMI nondisplaced. Regular rate & rhythm. No rubs, gallops. Soft SEM at RUSB. s2 ok No AI Lungs: clear Abdomen: soft, nontender, nondistended. No hepatosplenomegaly. No bruits or masses. Good bowel sounds. Extremities: no cyanosis, clubbing, rash, edema Neuro: alert & orientedx3, cranial nerves grossly intact. moves all 4 extremities w/o difficulty. Affect pleasant   ASSESSMENT & PLAN:  1) Chronic systolic HF: NICM,  ECHO ~86% in 2013. EF 45-50% in 2016. Suspect due to ongoing AI +/- HTN - s/p AoV repair 10/18 -ECHO 06/2017 EF 45-50% Grade II DD.  -PYP scan negative for amyloid.  -NYHA II. Volume status ok on 40 mg daily with extra 40 mg lasix for 3 pound weight gain.  - On goal dose carvedilol and entresto.  - Continue 25 mg mg hydralazine three times a day (had HAs with Imdur in past) 2) HTN :  - Stable. - Discussed  goal SBP < 135. Call me if consistently over 140 in CR 3) CKD stage III:  (baseline Cr 1.5-1.7) - BMET today 4) Snoring: Has mild polycythemia. Likely has OSA.   5) S/P AVR 05/01/2017 - Starting cardiac rehab this week.   Follow up in 3 months. Darrick Grinder, NP  9:14 AM    Patient seen and examined with Darrick Grinder, NP. We discussed all aspects of the encounter. I agree with the assessment and plan as stated above.   Doing very well post AoV repair. Echo from earlier this month reviewed personally. EF 45-50%. No AI. SBP much improved. Volume status looks good. Continue current regimen. Proceed with CR.   Glori Bickers, MD  9:50 AM

## 2017-06-28 NOTE — Patient Instructions (Signed)
Labs today  Your physician recommends that you schedule a follow-up appointment in: 3 months with Dr Haroldine Laws

## 2017-06-29 ENCOUNTER — Encounter (HOSPITAL_COMMUNITY): Payer: BLUE CROSS/BLUE SHIELD

## 2017-07-22 ENCOUNTER — Encounter (HOSPITAL_COMMUNITY): Payer: BLUE CROSS/BLUE SHIELD

## 2017-07-23 ENCOUNTER — Encounter (HOSPITAL_COMMUNITY)
Admission: RE | Admit: 2017-07-23 | Discharge: 2017-07-23 | Disposition: A | Discharge status: Home / Self Care | Payer: BLUE CROSS/BLUE SHIELD | Source: Ambulatory Visit | Attending: Thoracic Surgery (Cardiothoracic Vascular Surgery) | Admitting: Thoracic Surgery (Cardiothoracic Vascular Surgery)

## 2017-07-23 VITALS — BP 126/74 | HR 77 | Ht 70.0 in | Wt 225.0 lb

## 2017-07-23 DIAGNOSIS — I429 Cardiomyopathy, unspecified: Secondary | ICD-10-CM | POA: Diagnosis not present

## 2017-07-23 DIAGNOSIS — F419 Anxiety disorder, unspecified: Secondary | ICD-10-CM | POA: Insufficient documentation

## 2017-07-23 DIAGNOSIS — N182 Chronic kidney disease, stage 2 (mild): Secondary | ICD-10-CM | POA: Diagnosis not present

## 2017-07-23 DIAGNOSIS — I5042 Chronic combined systolic (congestive) and diastolic (congestive) heart failure: Secondary | ICD-10-CM | POA: Diagnosis not present

## 2017-07-23 DIAGNOSIS — I130 Hypertensive heart and chronic kidney disease with heart failure and stage 1 through stage 4 chronic kidney disease, or unspecified chronic kidney disease: Secondary | ICD-10-CM | POA: Insufficient documentation

## 2017-07-23 DIAGNOSIS — Z9889 Other specified postprocedural states: Secondary | ICD-10-CM | POA: Diagnosis not present

## 2017-07-23 DIAGNOSIS — I351 Nonrheumatic aortic (valve) insufficiency: Secondary | ICD-10-CM | POA: Diagnosis not present

## 2017-07-23 NOTE — Progress Notes (Signed)
Cardiac Individual Treatment Plan  Patient Details  Name: [name] [name] MRN: [id number] Date of Birth: 03/07/1981 Referring Provider:     CARDIAC REHAB PHASE II ORIENTATION from 07/23/2017 in [place]  Referring Provider  Dr. Roxy Manns      Initial Encounter Date:    CARDIAC REHAB PHASE II ORIENTATION from 07/23/2017 in [place]  Date  07/23/17  Referring Provider  Dr. Roxy Manns      Visit Diagnosis: S/P aortic valve repair  Patient's Home Medications on Admission:  Current Outpatient Medications:  .  acetaminophen (TYLENOL) 500 MG tablet, Take 1,000 mg by mouth every 6 (six) hours as needed for moderate pain or headache. , Disp: , Rfl:  .  aspirin EC 325 MG EC tablet, Take 1 tablet (325 mg total) daily by mouth., Disp: 30 tablet, Rfl: 0 .  carvedilol (COREG) 25 MG tablet, Take 1 tablet (25 mg total) by mouth 2 (two) times daily with a meal., Disp: 60 tablet, Rfl: 6 .  clobetasol cream (TEMOVATE) 9.50 %, Apply 1 application topically every other day., Disp: , Rfl:  .  ENTRESTO 97-103 MG, TAKE 1 TABLET BY MOUTH TWICE DAILY, Disp: 60 tablet, Rfl: 3 .  furosemide (LASIX) 40 MG tablet, Take 1 tablet (40 mg total) 2 (two) times daily by mouth. (Patient taking differently: Take 40 mg by mouth daily. ), Disp: 60 tablet, Rfl: 1 .  hydrALAZINE (APRESOLINE) 25 MG tablet, Take 1 tablet (25 mg total) by mouth 3 (three) times daily., Disp: 90 tablet, Rfl: 3 .  mupirocin ointment (BACTROBAN) 2 %, , Disp: , Rfl:  .  oxyCODONE (OXY IR/ROXICODONE) 5 MG immediate release tablet, Take 5 mg by mouth every 4-6 hours PRN severe pain (Patient not taking: Reported on 07/23/2017), Disp: 30 tablet, Rfl: 0 .  traMADol (ULTRAM) 50 MG tablet, Take 1 tablet (50 mg total) as directed by mouth. Take 1-2 tab every 6 hours as needed for pain, no more than 10 tablets a day (Patient not taking: Reported on 07/23/2017), Disp: 60 tablet, Rfl: 0  Past Medical History: Past Medical  History:  Diagnosis Date  . Anxiety   . CHF (congestive heart failure) ([place])   . Chronic systolic heart failure ([place])   . CKD (chronic kidney disease) stage 2, GFR 60-89 ml/min   . Dyspnea    with value issues- "if I dont take my medication"  . Essential hypertension, benign   . Headache   . History of pneumonia   . Mitral regurgitation    Moderate  . Noncompliance   . Nonischemic cardiomyopathy ([place])    Normal coronaries May 2012, LVEF < 20%  . Pneumonia 2011  . S/P aortic valve repair 05/11/2017   Complex valvuloplasty including plication of left coronary leaflet and 93mm Biostable HAART ring annuloplasty    Tobacco Use: Social History   Tobacco Use  Smoking Status Never Smoker  Smokeless Tobacco Never Used    Labs: Recent Review Flowsheet Data    Labs for ITP Cardiac and Pulmonary Rehab Latest Ref Rng & Units 05/12/2017 05/12/2017 05/12/2017 05/12/2017 05/13/2017   Cholestrol 0 - 200 mg/dL - - - - -   LDLCALC 0 - 99 mg/dL - - - - -   HDL >39 mg/dL - - - - -   Trlycerides <150 mg/dL - - - - -   Hemoglobin A1c 4.8 - 5.6 % - - - - -   PHART 7.350 - 7.450 - 7.405 - - -  PCO2ART 32.0 - 48.0 mmHg - 34.0 - - -   HCO3 20.0 - 28.0 mmol/L - 20.8 - - -   TCO2 22 - 32 mmol/L - - - 22 -   ACIDBASEDEF 0.0 - 2.0 mmol/L - 3.1(H) - - -   O2SAT % 96.1 96.7 74.5 - 91.3      Capillary Blood Glucose: Lab Results  Component Value Date   GLUCAP 134 (H) 05/14/2017   GLUCAP 125 (H) 05/14/2017   GLUCAP 119 (H) 05/13/2017   GLUCAP 121 (H) 05/13/2017   GLUCAP 138 (H) 05/13/2017     Exercise Target Goals: Date: 07/23/17  Exercise Program Goal: Individual exercise prescription set with THRR, safety & activity barriers. Participant demonstrates ability to understand and report RPE using BORG scale, to self-measure pulse accurately, and to acknowledge the importance of the exercise prescription.  Exercise Prescription Goal: Starting with aerobic activity 30 plus minutes a day, 3  days per week for initial exercise prescription. Provide home exercise prescription and guidelines that participant acknowledges understanding prior to discharge.  Activity Barriers & Risk Stratification: Activity Barriers & Cardiac Risk Stratification - 07/23/17 1331      Activity Barriers & Cardiac Risk Stratification   Activity Barriers  None    Cardiac Risk Stratification  High       6 Minute Walk: 6 Minute Walk    Row Name 07/23/17 1330         6 Minute Walk   Phase  Initial     Distance  1800 feet     Distance % Change  0 %     Distance Feet Change  0 ft     Walk Time  6 minutes     # of Rest Breaks  0     MPH  3.4     METS  3.61     RPE  12     Perceived Dyspnea   12     VO2 Peak  18.84     Symptoms  No     Resting HR  77 bpm     Resting BP  126/74     Resting Oxygen Saturation   97 %     Exercise Oxygen Saturation  during 6 min walk  97 %     Max Ex. HR  93 bpm     Max Ex. BP  142/70     2 Minute Post BP  122/72        Oxygen Initial Assessment:   Oxygen Re-Evaluation:   Oxygen Discharge (Final Oxygen Re-Evaluation):   Initial Exercise Prescription: Initial Exercise Prescription - 07/23/17 1300      Date of Initial Exercise RX and Referring Provider   Date  07/23/17    Referring Provider  Dr. Roxy Manns      Treadmill   MPH  2.2    Grade  0    Minutes  15    METs  2.6      Recumbant Elliptical   Level  1    RPM  45    Watts  48    Minutes  20    METs  2.9      Prescription Details   Frequency (times per week)  3    Duration  Progress to 30 minutes of continuous aerobic without signs/symptoms of physical distress      Intensity   THRR 40-80% of Max Heartrate  [phone]    Ratings of Perceived Exertion  11-13  Perceived Dyspnea  0-4      Progression   Progression  Continue progressive overload as per policy without signs/symptoms or physical distress.      Resistance Training   Training Prescription  Yes    Weight  1    Reps   10-15       Perform Capillary Blood Glucose checks as needed.  Exercise Prescription Changes:   Exercise Comments:   Exercise Goals and Review:  Exercise Goals    Row Name 07/23/17 1335             Exercise Goals   Increase Physical Activity  Yes       Intervention  Provide advice, education, support and counseling about physical activity/exercise needs.;Develop an individualized exercise prescription for aerobic and resistive training based on initial evaluation findings, risk stratification, comorbidities and participant's personal goals.       Expected Outcomes  Achievement of increased cardiorespiratory fitness and enhanced flexibility, muscular endurance and strength shown through measurements of functional capacity and personal statement of participant.       Increase Strength and Stamina  Yes       Intervention  Develop an individualized exercise prescription for aerobic and resistive training based on initial evaluation findings, risk stratification, comorbidities and participant's personal goals.;Provide advice, education, support and counseling about physical activity/exercise needs.       Expected Outcomes  Achievement of increased cardiorespiratory fitness and enhanced flexibility, muscular endurance and strength shown through measurements of functional capacity and personal statement of participant.       Able to understand and use rate of perceived exertion (RPE) scale  Yes       Intervention  Provide education and explanation on how to use RPE scale       Expected Outcomes  Short Term: Able to use RPE daily in rehab to express subjective intensity level;Long Term:  Able to use RPE to guide intensity level when exercising independently       Able to understand and use Dyspnea scale  Yes       Intervention  Provide education and explanation on how to use Dyspnea scale       Expected Outcomes  Short Term: Able to use Dyspnea scale daily in rehab to express subjective  sense of shortness of breath during exertion;Long Term: Able to use Dyspnea scale to guide intensity level when exercising independently       Knowledge and understanding of Target Heart Rate Range (THRR)  Yes       Intervention  Provide education and explanation of THRR including how the numbers were predicted and where they are located for reference       Expected Outcomes  Short Term: Able to state/look up THRR       Able to check pulse independently  Yes       Intervention  Provide education and demonstration on how to check pulse in carotid and radial arteries.;Review the importance of being able to check your own pulse for safety during independent exercise       Expected Outcomes  Short Term: Able to explain why pulse checking is important during independent exercise;Long Term: Able to check pulse independently and accurately       Understanding of Exercise Prescription  Yes       Intervention  Provide education, explanation, and written materials on patient's individual exercise prescription       Expected Outcomes  Short Term: Able to explain program exercise prescription;Long  Term: Able to explain home exercise prescription to exercise independently          Exercise Goals Re-Evaluation :    Discharge Exercise Prescription (Final Exercise Prescription Changes):   Nutrition:  Target Goals: Understanding of nutrition guidelines, daily intake of sodium 1500mg , cholesterol 200mg , calories 30% from fat and 7% or less from saturated fats, daily to have 5 or more servings of fruits and vegetables.  Biometrics: Pre Biometrics - 07/23/17 1336      Pre Biometrics   Height  5\' 10"  (1.778 m)    Waist Circumference  41.5 inches    Hip Circumference  42.5 inches    Waist to Hip Ratio  0.98 %    Triceps Skinfold  7 mm    % Body Fat  25.6 %    Grip Strength  56.8 kg    Flexibility  16 in    Single Leg Stand  60 seconds        Nutrition Therapy Plan and Nutrition Goals: Nutrition  Therapy & Goals - 07/23/17 1423      Personal Nutrition Goals   Nutrition Goal  Patient is on a regular diet. His Medficts score was 97. He is interested in learning how to eat healthier.     Additional Goals?  No      Intervention Plan   Expected Outcomes  Short Term Goal: Understand basic principles of dietary content, such as calories, fat, sodium, cholesterol and nutrients.       Nutrition Discharge: Rate Your Plate Scores:   Nutrition Goals Re-Evaluation:   Nutrition Goals Discharge (Final Nutrition Goals Re-Evaluation):   Psychosocial: Target Goals: Acknowledge presence or absence of significant depression and/or stress, maximize coping skills, provide positive support system. Participant is able to verbalize types and ability to use techniques and skills needed for reducing stress and depression.  Initial Review & Psychosocial Screening: Initial Psych Review & Screening - 07/23/17 1426      Initial Review   Current issues with  None Identified      Family Dynamics   Good Support System?  Yes    Comments  His QOL score was 26.36 and his PHQ-9 score was 7. He says he gets down at times when he looks at his scars but is improving.       Barriers   Psychosocial barriers to participate in program  There are no identifiable barriers or psychosocial needs.       Quality of Life Scores: Quality of Life - 07/23/17 1336      Quality of Life Scores   Health/Function Pre  25.2 %    Socioeconomic Pre  25.71 %    Psych/Spiritual Pre  29.14 %    Family Pre  27 %    GLOBAL Pre  26.36 %       PHQ-9: Recent Review Flowsheet Data    Depression screen [facility] 2/9 07/23/2017   Decreased Interest 0   Down, Depressed, Hopeless 1   PHQ - 2 Score 1   Altered sleeping 3   Tired, decreased energy 1   Change in appetite 1   Feeling bad or failure about yourself  1   Trouble concentrating 0   Moving slowly or fidgety/restless 0   Suicidal thoughts 0   PHQ-9 Score 7   Difficult  doing work/chores Not difficult at all     Interpretation of Total Score  Total Score Depression Severity:  1-4 = Minimal depression, 5-9 = Mild depression, 10-14 =  Moderate depression, 15-19 = Moderately severe depression, 20-27 = Severe depression   Psychosocial Evaluation and Intervention: Psychosocial Evaluation - 07/23/17 1428      Psychosocial Evaluation & Interventions   Interventions  Encouraged to exercise with the program and follow exercise prescription;Stress management education;Relaxation education    Expected Outcomes  Patient will have no psychosocial barriers identified at discharge.     Continue Psychosocial Services   No Follow up required       Psychosocial Re-Evaluation:   Psychosocial Discharge (Final Psychosocial Re-Evaluation):   Vocational Rehabilitation: Provide vocational rehab assistance to qualifying candidates.   Vocational Rehab Evaluation & Intervention: Vocational Rehab - 07/23/17 1422      Initial Vocational Rehab Evaluation & Intervention   Assessment shows need for Vocational Rehabilitation  No       Education: Education Goals: Education classes will be provided on a weekly basis, covering required topics. Participant will state understanding/return demonstration of topics presented.  Learning Barriers/Preferences: Learning Barriers/Preferences - 07/23/17 1421      Learning Barriers/Preferences   Learning Barriers  None    Learning Preferences  Written Material;Skilled Demonstration;Pictoral       Education Topics: Hypertension, Hypertension Reduction -Define heart disease and high blood pressure. Discus how high blood pressure affects the body and ways to reduce high blood pressure.   Exercise and Your Heart -Discuss why it is important to exercise, the FITT principles of exercise, normal and abnormal responses to exercise, and how to exercise safely.   Angina -Discuss definition of angina, causes of angina, treatment of  angina, and how to decrease risk of having angina.   Cardiac Medications -Review what the following cardiac medications are used for, how they affect the body, and side effects that may occur when taking the medications.  Medications include Aspirin, Beta blockers, calcium channel blockers, ACE Inhibitors, angiotensin receptor blockers, diuretics, digoxin, and antihyperlipidemics.   Congestive Heart Failure -Discuss the definition of CHF, how to live with CHF, the signs and symptoms of CHF, and how keep track of weight and sodium intake.   Heart Disease and Intimacy -Discus the effect sexual activity has on the heart, how changes occur during intimacy as we age, and safety during sexual activity.   Smoking Cessation / COPD -Discuss different methods to quit smoking, the health benefits of quitting smoking, and the definition of COPD.   Nutrition I: Fats -Discuss the types of cholesterol, what cholesterol does to the heart, and how cholesterol levels can be controlled.   Nutrition II: Labels -Discuss the different components of food labels and how to read food label   Heart Parts and Heart Disease -Discuss the anatomy of the heart, the pathway of blood circulation through the heart, and these are affected by heart disease.   Stress I: Signs and Symptoms -Discuss the causes of stress, how stress may lead to anxiety and depression, and ways to limit stress.   Stress II: Relaxation -Discuss different types of relaxation techniques to limit stress.   Warning Signs of Stroke / TIA -Discuss definition of a stroke, what the signs and symptoms are of a stroke, and how to identify when someone is having stroke.   Knowledge Questionnaire Score: Knowledge Questionnaire Score - 07/23/17 1421      Knowledge Questionnaire Score   Pre Score  18/24       Core Components/Risk Factors/Patient Goals at Admission: Personal Goals and Risk Factors at Admission - 07/23/17 1424      Core  Components/Risk Factors/Patient  Goals on Admission    Weight Management  Yes    Admit Weight  225 lb 1.6 oz (102.1 kg)    Goal Weight: Short Term  215 lb (97.5 kg)    Goal Weight: Long Term  205 lb (93 kg)    Expected Outcomes  Long Term: Adherence to nutrition and physical activity/exercise program aimed toward attainment of established weight goal;Weight Loss: Understanding of general recommendations for a balanced deficit meal plan, which promotes 1-2 lb weight loss per week and includes a negative energy balance of [phone] kcal/d;Weight Gain: Understanding of general recommendations for a high calorie, high protein meal plan that promotes weight gain by distributing calorie intake throughout the day with the consumption for 4-5 meals, snacks, and/or supplements    Hypertension  Yes    Intervention  Provide education on lifestyle modifcations including regular physical activity/exercise, weight management, moderate sodium restriction and increased consumption of fresh fruit, vegetables, and low fat dairy, alcohol moderation, and smoking cessation.;Monitor prescription use compliance.    Expected Outcomes  Short Term: Continued assessment and intervention until BP is < 140/74mm HG in hypertensive participants. < 130/1mm HG in hypertensive participants with diabetes, heart failure or chronic kidney disease.;Long Term: Maintenance of blood pressure at goal levels.    Personal Goal Other  Yes    Personal Goal  Get stronger; Get back to work; Lose weight-get under 200 lbs.     Intervention  Patient will attend CR 3 days/week and supplement with exercise 2 days/week.     Expected Outcomes  Patient will meet his personal goals.        Core Components/Risk Factors/Patient Goals Review:    Core Components/Risk Factors/Patient Goals at Discharge (Final Review):    ITP Comments:   Comments: Patient arrived for 1st visit/orientation/education at 1230. Patient was referred to CR by Dr Roxy Manns due to  s/p aortic valve repair [phone]).  During orientation advised patient on arrival and appointment times what to wear, what to do before, during and after exercise. Reviewed attendance and class policy. Talked about inclement weather and class consultation policy. Pt is scheduled to return Cardiac Rehab on 07/26/17 at 9:30. Pt was advised to come to class 15 minutes before class starts. Patient was also given instructions on meeting with the dietician and attending the Family Structure classes. Discussed RPE/Dpysnea scales. Discussed initial THR and how to find their radial and/or carotid pulse. Discussed the initial exercise prescription and how this effects their progress. Pt is eager to get started. Patient participated in warm up stretches followed by light weights and resistance bands. Patient was able to complete 6 minute walk test.  Patient was measured for the equipment. Discussed equipment safety with patient. Took patient pre-anthropometric measurements. Patient finished visit at 1430.

## 2017-07-23 NOTE — Progress Notes (Signed)
Daily Session Note  Patient Details  Name: [name] [name] MRN: [id number] Date of Birth: [birth year] Referring Provider:     CARDIAC REHAB PHASE II ORIENTATION from 07/23/2017 in [place]  Referring Provider  Dr. Roxy Manns      Encounter Date: 07/23/2017  Check In: Session Check In - 07/23/17 1230      Check-In   Location  [facility]    Staff Present  Suzanne Boron, BS, EP, Exercise Physiologist;Nakia Koble Wynetta Emery, RN, BSN    Supervising physician immediately available to respond to emergencies  See telemetry face sheet for immediately available MD    Medication changes reported      No    Fall or balance concerns reported     No    Warm-up and Cool-down  Performed as group-led instruction    Resistance Training Performed  Yes    VAD Patient?  No      Pain Assessment   Currently in Pain?  No/denies    Pain Score  0-No pain       Capillary Blood Glucose: No results found for this or any previous visit (from the past 24 hour(s)).    Social History   Tobacco Use  Smoking Status Never Smoker  Smokeless Tobacco Never Used    Goals Met:  Exercise tolerated well Personal goals reviewed Strength training completed today  Goals Unmet:  Not Applicable  Comments: Check out 1430.   Dr. Kate Sable is Medical Director for [facility] Cardiac and Pulmonary Rehab.

## 2017-07-23 NOTE — Progress Notes (Signed)
Cardiac/Pulmonary Rehab Medication Review by a Pharmacist  Does the patient  feel that his/her medications are working for him/her?  yes  Has the patient been experiencing any side effects to the medications prescribed?  no  Does the patient measure his/her own blood pressure or blood glucose at home?  yes   Does the patient have any problems obtaining medications due to transportation or finances?   no  Understanding of regimen: excellent Understanding of indications: excellent Potential of compliance: excellent  Questions asked to Determine Patient Understanding of Medication Regimen:  1. What is the name of the medication?  2. What is the medication used for?  3. When should it be taken?  4. How much should be taken?  5. How will you take it?  6. What side effects should you report?  Understanding Defined as: Excellent: All questions above are correct Good: Questions 1-4 are correct Fair: Questions 1-2 are correct  Poor: 1 or none of the above questions are correct   Pharmacist comments: 37 yo male with recent aortic valve replaced is participating in cardiac rehab. He was very knowledgeable regarding medication regimen and compliant. He monitors his BP with a manual cuff. I suggested he consider purchasing a monitor that also shows his HR or can also check manually since he is on a beta blocker. He is tolerating his regimen and not having any problems.   Thanks for the opportunity to participate in the care of this patient,  Isac Sarna, BS [place], [place] Clinical Pharmacist Pager [phone] 07/23/2017 1:48 PM

## 2017-07-26 ENCOUNTER — Encounter (HOSPITAL_COMMUNITY)
Admission: RE | Admit: 2017-07-26 | Discharge: 2017-07-26 | Disposition: A | Discharge status: Home / Self Care | Payer: BLUE CROSS/BLUE SHIELD | Source: Ambulatory Visit | Attending: Thoracic Surgery (Cardiothoracic Vascular Surgery) | Admitting: Thoracic Surgery (Cardiothoracic Vascular Surgery)

## 2017-07-26 DIAGNOSIS — I429 Cardiomyopathy, unspecified: Secondary | ICD-10-CM | POA: Diagnosis not present

## 2017-07-26 DIAGNOSIS — I351 Nonrheumatic aortic (valve) insufficiency: Secondary | ICD-10-CM | POA: Diagnosis not present

## 2017-07-26 DIAGNOSIS — Z9889 Other specified postprocedural states: Secondary | ICD-10-CM | POA: Diagnosis not present

## 2017-07-26 DIAGNOSIS — N182 Chronic kidney disease, stage 2 (mild): Secondary | ICD-10-CM | POA: Diagnosis not present

## 2017-07-26 DIAGNOSIS — I130 Hypertensive heart and chronic kidney disease with heart failure and stage 1 through stage 4 chronic kidney disease, or unspecified chronic kidney disease: Secondary | ICD-10-CM | POA: Diagnosis not present

## 2017-07-26 DIAGNOSIS — I5042 Chronic combined systolic (congestive) and diastolic (congestive) heart failure: Secondary | ICD-10-CM | POA: Diagnosis not present

## 2017-07-26 DIAGNOSIS — F419 Anxiety disorder, unspecified: Secondary | ICD-10-CM | POA: Diagnosis not present

## 2017-07-26 NOTE — Progress Notes (Signed)
Cardiac Individual Treatment Plan  Patient Details  Name: KURT HOFFMEIER MRN: [id number] Date of Birth: 07-Aug-1980 Referring Provider:     CARDIAC REHAB PHASE II ORIENTATION from 07/23/2017 in [place]  Referring Provider  Dr. Roxy Manns      Initial Encounter Date:    CARDIAC REHAB PHASE II ORIENTATION from 07/23/2017 in [place]  Date  07/23/17  Referring Provider  Dr. Roxy Manns      Visit Diagnosis: S/P aortic valve repair  Patient's Home Medications on Admission:  Current Outpatient Medications:  .  acetaminophen (TYLENOL) 500 MG tablet, Take 1,000 mg by mouth every 6 (six) hours as needed for moderate pain or headache. , Disp: , Rfl:  .  aspirin EC 325 MG EC tablet, Take 1 tablet (325 mg total) daily by mouth., Disp: 30 tablet, Rfl: 0 .  carvedilol (COREG) 25 MG tablet, Take 1 tablet (25 mg total) by mouth 2 (two) times daily with a meal., Disp: 60 tablet, Rfl: 6 .  clobetasol cream (TEMOVATE) 2.80 %, Apply 1 application topically every other day., Disp: , Rfl:  .  ENTRESTO 97-103 MG, TAKE 1 TABLET BY MOUTH TWICE DAILY, Disp: 60 tablet, Rfl: 3 .  furosemide (LASIX) 40 MG tablet, Take 1 tablet (40 mg total) 2 (two) times daily by mouth. (Patient taking differently: Take 40 mg by mouth daily. ), Disp: 60 tablet, Rfl: 1 .  hydrALAZINE (APRESOLINE) 25 MG tablet, Take 1 tablet (25 mg total) by mouth 3 (three) times daily., Disp: 90 tablet, Rfl: 3 .  mupirocin ointment (BACTROBAN) 2 %, , Disp: , Rfl:  .  oxyCODONE (OXY IR/ROXICODONE) 5 MG immediate release tablet, Take 5 mg by mouth every 4-6 hours PRN severe pain (Patient not taking: Reported on 07/23/2017), Disp: 30 tablet, Rfl: 0 .  traMADol (ULTRAM) 50 MG tablet, Take 1 tablet (50 mg total) as directed by mouth. Take 1-2 tab every 6 hours as needed for pain, no more than 10 tablets a day (Patient not taking: Reported on 07/23/2017), Disp: 60 tablet, Rfl: 0  Past Medical History: Past Medical  History:  Diagnosis Date  . Anxiety   . CHF (congestive heart failure) ([place])   . Chronic systolic heart failure ([place])   . CKD (chronic kidney disease) stage 2, GFR 60-89 ml/min   . Dyspnea    with value issues- "if I dont take my medication"  . Essential hypertension, benign   . Headache   . History of pneumonia   . Mitral regurgitation    Moderate  . Noncompliance   . Nonischemic cardiomyopathy ([place])    Normal coronaries May 2012, LVEF < 20%  . Pneumonia 2011  . S/P aortic valve repair 05/11/2017   Complex valvuloplasty including plication of left coronary leaflet and 89mm Biostable HAART ring annuloplasty    Tobacco Use: Social History   Tobacco Use  Smoking Status Never Smoker  Smokeless Tobacco Never Used    Labs: Recent Review Flowsheet Data    Labs for ITP Cardiac and Pulmonary Rehab Latest Ref Rng & Units 05/12/2017 05/12/2017 05/12/2017 05/12/2017 05/13/2017   Cholestrol 0 - 200 mg/dL - - - - -   LDLCALC 0 - 99 mg/dL - - - - -   HDL >39 mg/dL - - - - -   Trlycerides <150 mg/dL - - - - -   Hemoglobin A1c 4.8 - 5.6 % - - - - -   PHART 7.350 - 7.450 - 7.405 - - -  PCO2ART 32.0 - 48.0 mmHg - 34.0 - - -   HCO3 20.0 - 28.0 mmol/L - 20.8 - - -   TCO2 22 - 32 mmol/L - - - 22 -   ACIDBASEDEF 0.0 - 2.0 mmol/L - 3.1(H) - - -   O2SAT % 96.1 96.7 74.5 - 91.3      Capillary Blood Glucose: Lab Results  Component Value Date   GLUCAP 134 (H) 05/14/2017   GLUCAP 125 (H) 05/14/2017   GLUCAP 119 (H) 05/13/2017   GLUCAP 121 (H) 05/13/2017   GLUCAP 138 (H) 05/13/2017     Exercise Target Goals:    Exercise Program Goal: Individual exercise prescription set with THRR, safety & activity barriers. Participant demonstrates ability to understand and report RPE using BORG scale, to self-measure pulse accurately, and to acknowledge the importance of the exercise prescription.  Exercise Prescription Goal: Starting with aerobic activity 30 plus minutes a day, 3 days per week  for initial exercise prescription. Provide home exercise prescription and guidelines that participant acknowledges understanding prior to discharge.  Activity Barriers & Risk Stratification: Activity Barriers & Cardiac Risk Stratification - 07/23/17 1331      Activity Barriers & Cardiac Risk Stratification   Activity Barriers  None    Cardiac Risk Stratification  High       6 Minute Walk: 6 Minute Walk    Row Name 07/23/17 1330         6 Minute Walk   Phase  Initial     Distance  1800 feet     Distance % Change  0 %     Distance Feet Change  0 ft     Walk Time  6 minutes     # of Rest Breaks  0     MPH  3.4     METS  3.61     RPE  12     Perceived Dyspnea   12     VO2 Peak  18.84     Symptoms  No     Resting HR  77 bpm     Resting BP  126/74     Resting Oxygen Saturation   97 %     Exercise Oxygen Saturation  during 6 min walk  97 %     Max Ex. HR  93 bpm     Max Ex. BP  142/70     2 Minute Post BP  122/72        Oxygen Initial Assessment:   Oxygen Re-Evaluation:   Oxygen Discharge (Final Oxygen Re-Evaluation):   Initial Exercise Prescription: Initial Exercise Prescription - 07/23/17 1300      Date of Initial Exercise RX and Referring Provider   Date  07/23/17    Referring Provider  Dr. Roxy Manns      Treadmill   MPH  2.2    Grade  0    Minutes  15    METs  2.6      Recumbant Elliptical   Level  1    RPM  45    Watts  48    Minutes  20    METs  2.9      Prescription Details   Frequency (times per week)  3    Duration  Progress to 30 minutes of continuous aerobic without signs/symptoms of physical distress      Intensity   THRR 40-80% of Max Heartrate  [phone]    Ratings of Perceived Exertion  11-13  Perceived Dyspnea  0-4      Progression   Progression  Continue progressive overload as per policy without signs/symptoms or physical distress.      Resistance Training   Training Prescription  Yes    Weight  1    Reps  10-15        Perform Capillary Blood Glucose checks as needed.  Exercise Prescription Changes:   Exercise Comments:   Exercise Goals and Review:  Exercise Goals    Row Name 07/23/17 1335             Exercise Goals   Increase Physical Activity  Yes       Intervention  Provide advice, education, support and counseling about physical activity/exercise needs.;Develop an individualized exercise prescription for aerobic and resistive training based on initial evaluation findings, risk stratification, comorbidities and participant's personal goals.       Expected Outcomes  Achievement of increased cardiorespiratory fitness and enhanced flexibility, muscular endurance and strength shown through measurements of functional capacity and personal statement of participant.       Increase Strength and Stamina  Yes       Intervention  Develop an individualized exercise prescription for aerobic and resistive training based on initial evaluation findings, risk stratification, comorbidities and participant's personal goals.;Provide advice, education, support and counseling about physical activity/exercise needs.       Expected Outcomes  Achievement of increased cardiorespiratory fitness and enhanced flexibility, muscular endurance and strength shown through measurements of functional capacity and personal statement of participant.       Able to understand and use rate of perceived exertion (RPE) scale  Yes       Intervention  Provide education and explanation on how to use RPE scale       Expected Outcomes  Short Term: Able to use RPE daily in rehab to express subjective intensity level;Long Term:  Able to use RPE to guide intensity level when exercising independently       Able to understand and use Dyspnea scale  Yes       Intervention  Provide education and explanation on how to use Dyspnea scale       Expected Outcomes  Short Term: Able to use Dyspnea scale daily in rehab to express subjective sense of  shortness of breath during exertion;Long Term: Able to use Dyspnea scale to guide intensity level when exercising independently       Knowledge and understanding of Target Heart Rate Range (THRR)  Yes       Intervention  Provide education and explanation of THRR including how the numbers were predicted and where they are located for reference       Expected Outcomes  Short Term: Able to state/look up THRR       Able to check pulse independently  Yes       Intervention  Provide education and demonstration on how to check pulse in carotid and radial arteries.;Review the importance of being able to check your own pulse for safety during independent exercise       Expected Outcomes  Short Term: Able to explain why pulse checking is important during independent exercise;Long Term: Able to check pulse independently and accurately       Understanding of Exercise Prescription  Yes       Intervention  Provide education, explanation, and written materials on patient's individual exercise prescription       Expected Outcomes  Short Term: Able to explain program exercise prescription;Long  Term: Able to explain home exercise prescription to exercise independently          Exercise Goals Re-Evaluation :    Discharge Exercise Prescription (Final Exercise Prescription Changes):   Nutrition:  Target Goals: Understanding of nutrition guidelines, daily intake of sodium 1500mg , cholesterol 200mg , calories 30% from fat and 7% or less from saturated fats, daily to have 5 or more servings of fruits and vegetables.  Biometrics: Pre Biometrics - 07/23/17 1336      Pre Biometrics   Height  5\' 10"  (1.778 m)    Waist Circumference  41.5 inches    Hip Circumference  42.5 inches    Waist to Hip Ratio  0.98 %    Triceps Skinfold  7 mm    % Body Fat  25.6 %    Grip Strength  56.8 kg    Flexibility  16 in    Single Leg Stand  60 seconds        Nutrition Therapy Plan and Nutrition Goals: Nutrition Therapy &  Goals - 07/23/17 1423      Personal Nutrition Goals   Nutrition Goal  Patient is on a regular diet. His Medficts score was 97. He is interested in learning how to eat healthier.     Additional Goals?  No      Intervention Plan   Expected Outcomes  Short Term Goal: Understand basic principles of dietary content, such as calories, fat, sodium, cholesterol and nutrients.       Nutrition Discharge: Rate Your Plate Scores:   Nutrition Goals Re-Evaluation:   Nutrition Goals Discharge (Final Nutrition Goals Re-Evaluation):   Psychosocial: Target Goals: Acknowledge presence or absence of significant depression and/or stress, maximize coping skills, provide positive support system. Participant is able to verbalize types and ability to use techniques and skills needed for reducing stress and depression.  Initial Review & Psychosocial Screening: Initial Psych Review & Screening - 07/23/17 1426      Initial Review   Current issues with  None Identified      Family Dynamics   Good Support System?  Yes    Comments  His QOL score was 26.36 and his PHQ-9 score was 7. He says he gets down at times when he looks at his scars but is improving.       Barriers   Psychosocial barriers to participate in program  There are no identifiable barriers or psychosocial needs.       Quality of Life Scores: Quality of Life - 07/23/17 1336      Quality of Life Scores   Health/Function Pre  25.2 %    Socioeconomic Pre  25.71 %    Psych/Spiritual Pre  29.14 %    Family Pre  27 %    GLOBAL Pre  26.36 %       PHQ-9: Recent Review Flowsheet Data    Depression screen [facility] 2/9 07/23/2017   Decreased Interest 0   Down, Depressed, Hopeless 1   PHQ - 2 Score 1   Altered sleeping 3   Tired, decreased energy 1   Change in appetite 1   Feeling bad or failure about yourself  1   Trouble concentrating 0   Moving slowly or fidgety/restless 0   Suicidal thoughts 0   PHQ-9 Score 7   Difficult doing  work/chores Not difficult at all     Interpretation of Total Score  Total Score Depression Severity:  1-4 = Minimal depression, 5-9 = Mild depression, 10-14 =  Moderate depression, 15-19 = Moderately severe depression, 20-27 = Severe depression   Psychosocial Evaluation and Intervention: Psychosocial Evaluation - 07/23/17 1428      Psychosocial Evaluation & Interventions   Interventions  Encouraged to exercise with the program and follow exercise prescription;Stress management education;Relaxation education    Expected Outcomes  Patient will have no psychosocial barriers identified at discharge.     Continue Psychosocial Services   No Follow up required       Psychosocial Re-Evaluation:   Psychosocial Discharge (Final Psychosocial Re-Evaluation):   Vocational Rehabilitation: Provide vocational rehab assistance to qualifying candidates.   Vocational Rehab Evaluation & Intervention: Vocational Rehab - 07/23/17 1422      Initial Vocational Rehab Evaluation & Intervention   Assessment shows need for Vocational Rehabilitation  No       Education: Education Goals: Education classes will be provided on a weekly basis, covering required topics. Participant will state understanding/return demonstration of topics presented.  Learning Barriers/Preferences: Learning Barriers/Preferences - 07/23/17 1421      Learning Barriers/Preferences   Learning Barriers  None    Learning Preferences  Written Material;Skilled Demonstration;Pictoral       Education Topics: Hypertension, Hypertension Reduction -Define heart disease and high blood pressure. Discus how high blood pressure affects the body and ways to reduce high blood pressure.   Exercise and Your Heart -Discuss why it is important to exercise, the FITT principles of exercise, normal and abnormal responses to exercise, and how to exercise safely.   Angina -Discuss definition of angina, causes of angina, treatment of angina,  and how to decrease risk of having angina.   Cardiac Medications -Review what the following cardiac medications are used for, how they affect the body, and side effects that may occur when taking the medications.  Medications include Aspirin, Beta blockers, calcium channel blockers, ACE Inhibitors, angiotensin receptor blockers, diuretics, digoxin, and antihyperlipidemics.   Congestive Heart Failure -Discuss the definition of CHF, how to live with CHF, the signs and symptoms of CHF, and how keep track of weight and sodium intake.   Heart Disease and Intimacy -Discus the effect sexual activity has on the heart, how changes occur during intimacy as we age, and safety during sexual activity.   Smoking Cessation / COPD -Discuss different methods to quit smoking, the health benefits of quitting smoking, and the definition of COPD.   Nutrition I: Fats -Discuss the types of cholesterol, what cholesterol does to the heart, and how cholesterol levels can be controlled.   Nutrition II: Labels -Discuss the different components of food labels and how to read food label   Heart Parts and Heart Disease -Discuss the anatomy of the heart, the pathway of blood circulation through the heart, and these are affected by heart disease.   Stress I: Signs and Symptoms -Discuss the causes of stress, how stress may lead to anxiety and depression, and ways to limit stress.   Stress II: Relaxation -Discuss different types of relaxation techniques to limit stress.   Warning Signs of Stroke / TIA -Discuss definition of a stroke, what the signs and symptoms are of a stroke, and how to identify when someone is having stroke.   Knowledge Questionnaire Score: Knowledge Questionnaire Score - 07/23/17 1421      Knowledge Questionnaire Score   Pre Score  18/24       Core Components/Risk Factors/Patient Goals at Admission: Personal Goals and Risk Factors at Admission - 07/23/17 1424      Core  Components/Risk Factors/Patient  Goals on Admission    Weight Management  Yes    Admit Weight  225 lb 1.6 oz (102.1 kg)    Goal Weight: Short Term  215 lb (97.5 kg)    Goal Weight: Long Term  205 lb (93 kg)    Expected Outcomes  Long Term: Adherence to nutrition and physical activity/exercise program aimed toward attainment of established weight goal;Weight Loss: Understanding of general recommendations for a balanced deficit meal plan, which promotes 1-2 lb weight loss per week and includes a negative energy balance of [phone] kcal/d;Weight Gain: Understanding of general recommendations for a high calorie, high protein meal plan that promotes weight gain by distributing calorie intake throughout the day with the consumption for 4-5 meals, snacks, and/or supplements    Hypertension  Yes    Intervention  Provide education on lifestyle modifcations including regular physical activity/exercise, weight management, moderate sodium restriction and increased consumption of fresh fruit, vegetables, and low fat dairy, alcohol moderation, and smoking cessation.;Monitor prescription use compliance.    Expected Outcomes  Short Term: Continued assessment and intervention until BP is < 140/89mm HG in hypertensive participants. < 130/48mm HG in hypertensive participants with diabetes, heart failure or chronic kidney disease.;Long Term: Maintenance of blood pressure at goal levels.    Personal Goal Other  Yes    Personal Goal  Get stronger; Get back to work; Lose weight-get under 200 lbs.     Intervention  Patient will attend CR 3 days/week and supplement with exercise 2 days/week.     Expected Outcomes  Patient will meet his personal goals.        Core Components/Risk Factors/Patient Goals Review:    Core Components/Risk Factors/Patient Goals at Discharge (Final Review):    ITP Comments: ITP Comments    Row Name 07/26/17 1058           ITP Comments  Patient new to program completing 2 sessions. Will  monitor for progress.           Comments: ITP 30 Day REVIEW Patient new to program completing 2 sessions. Will monitor for progress.

## 2017-07-26 NOTE — Progress Notes (Signed)
Daily Session Note  Patient Details  Name: [name] [name] MRN: [id number] Date of Birth: 07/20/1980 Referring Provider:     CARDIAC REHAB PHASE II ORIENTATION from 07/23/2017 in [place] [facility]  Referring Provider  Dr. Owen      Encounter Date: 07/26/2017  Check In: Session Check In - 07/26/17 0930      Check-In   Location  [facility]    Staff Present  Gregory Cowan, BS, EP, Exercise Physiologist;Debra Johnson, RN, BSN    Supervising physician immediately available to respond to emergencies  See telemetry face sheet for immediately available MD    Medication changes reported      No    Fall or balance concerns reported     No    Warm-up and Cool-down  Performed as group-led instruction    Resistance Training Performed  No    VAD Patient?  No      Pain Assessment   Currently in Pain?  No/denies    Pain Score  0-No pain    Multiple Pain Sites  No       Capillary Blood Glucose: No results found for this or any previous visit (from the past 24 hour(s)).    Social History   Tobacco Use  Smoking Status Never Smoker  Smokeless Tobacco Never Used    Goals Met:  Independence with exercise equipment Exercise tolerated well No report of cardiac concerns or symptoms Strength training completed today  Goals Unmet:  Not Applicable  Comments: Check out 1030.   Dr. Suresh Koneswaran is Medical Director for [facility]. 

## 2017-07-28 ENCOUNTER — Encounter (HOSPITAL_COMMUNITY)
Admission: RE | Admit: 2017-07-28 | Discharge: 2017-07-28 | Disposition: A | Discharge status: Home / Self Care | Payer: BLUE CROSS/BLUE SHIELD | Source: Ambulatory Visit | Attending: Thoracic Surgery (Cardiothoracic Vascular Surgery) | Admitting: Thoracic Surgery (Cardiothoracic Vascular Surgery)

## 2017-07-28 DIAGNOSIS — I429 Cardiomyopathy, unspecified: Secondary | ICD-10-CM | POA: Diagnosis not present

## 2017-07-28 DIAGNOSIS — F419 Anxiety disorder, unspecified: Secondary | ICD-10-CM | POA: Diagnosis not present

## 2017-07-28 DIAGNOSIS — Z9889 Other specified postprocedural states: Secondary | ICD-10-CM

## 2017-07-28 DIAGNOSIS — I351 Nonrheumatic aortic (valve) insufficiency: Secondary | ICD-10-CM | POA: Diagnosis not present

## 2017-07-28 DIAGNOSIS — I5042 Chronic combined systolic (congestive) and diastolic (congestive) heart failure: Secondary | ICD-10-CM | POA: Diagnosis not present

## 2017-07-28 DIAGNOSIS — I130 Hypertensive heart and chronic kidney disease with heart failure and stage 1 through stage 4 chronic kidney disease, or unspecified chronic kidney disease: Secondary | ICD-10-CM | POA: Diagnosis not present

## 2017-07-28 DIAGNOSIS — N182 Chronic kidney disease, stage 2 (mild): Secondary | ICD-10-CM | POA: Diagnosis not present

## 2017-07-28 NOTE — Progress Notes (Signed)
Patient received the take home exercise plan today. THR was addressed as well as safety tips for being active when not in CR. Patient demonstrated an understanding and was encouraged to ask any future questions as they arise.

## 2017-07-28 NOTE — Progress Notes (Signed)
Daily Session Note  Patient Details  Name: TRAVERS GOODLEY MRN: [id number] Date of Birth: 1980/11/19 Referring Provider:     CARDIAC REHAB PHASE II ORIENTATION from 07/23/2017 in [place]  Referring Provider  Dr. Roxy Manns      Encounter Date: 07/28/2017  Check In: Session Check In - 07/28/17 0930      Check-In   Location  [facility]    Staff Present  Suzanne Boron, BS, EP, Exercise Physiologist;Joane Postel Wynetta Emery, RN, BSN    Supervising physician immediately available to respond to emergencies  See telemetry face sheet for immediately available MD    Medication changes reported      No    Fall or balance concerns reported     No    Warm-up and Cool-down  Performed as group-led instruction    Resistance Training Performed  Yes    VAD Patient?  No      Pain Assessment   Currently in Pain?  No/denies    Pain Score  0-No pain    Multiple Pain Sites  No       Capillary Blood Glucose: No results found for this or any previous visit (from the past 24 hour(s)).    Social History   Tobacco Use  Smoking Status Never Smoker  Smokeless Tobacco Never Used    Goals Met:  Independence with exercise equipment Exercise tolerated well No report of cardiac concerns or symptoms Strength training completed today  Goals Unmet:  Not Applicable  Comments: Check out 1030.   Dr. Kate Sable is Medical Director for [facility] Re[facility].

## 2017-07-30 ENCOUNTER — Emergency Department (HOSPITAL_COMMUNITY)
Admission: EM | Admit: 2017-07-30 | Discharge: 2017-07-30 | Disposition: A | Discharge status: Home / Self Care | Payer: BLUE CROSS/BLUE SHIELD | Attending: Emergency Medicine | Admitting: Emergency Medicine

## 2017-07-30 ENCOUNTER — Telehealth (HOSPITAL_COMMUNITY): Payer: Self-pay | Admitting: *Deleted

## 2017-07-30 ENCOUNTER — Encounter (HOSPITAL_COMMUNITY)
Admission: RE | Admit: 2017-07-30 | Discharge: 2017-07-30 | Disposition: A | Discharge status: Home / Self Care | Payer: BLUE CROSS/BLUE SHIELD | Source: Ambulatory Visit | Attending: Thoracic Surgery (Cardiothoracic Vascular Surgery) | Admitting: Thoracic Surgery (Cardiothoracic Vascular Surgery)

## 2017-07-30 ENCOUNTER — Emergency Department (HOSPITAL_COMMUNITY): Payer: BLUE CROSS/BLUE SHIELD

## 2017-07-30 DIAGNOSIS — R002 Palpitations: Secondary | ICD-10-CM

## 2017-07-30 DIAGNOSIS — N182 Chronic kidney disease, stage 2 (mild): Secondary | ICD-10-CM | POA: Diagnosis not present

## 2017-07-30 DIAGNOSIS — Z7982 Long term (current) use of aspirin: Secondary | ICD-10-CM | POA: Diagnosis not present

## 2017-07-30 DIAGNOSIS — I130 Hypertensive heart and chronic kidney disease with heart failure and stage 1 through stage 4 chronic kidney disease, or unspecified chronic kidney disease: Secondary | ICD-10-CM | POA: Diagnosis not present

## 2017-07-30 DIAGNOSIS — Z9889 Other specified postprocedural states: Secondary | ICD-10-CM

## 2017-07-30 DIAGNOSIS — I5022 Chronic systolic (congestive) heart failure: Secondary | ICD-10-CM | POA: Diagnosis not present

## 2017-07-30 DIAGNOSIS — R42 Dizziness and giddiness: Secondary | ICD-10-CM | POA: Diagnosis not present

## 2017-07-30 DIAGNOSIS — Z79899 Other long term (current) drug therapy: Secondary | ICD-10-CM | POA: Insufficient documentation

## 2017-07-30 LAB — COMPREHENSIVE METABOLIC PANEL
ALBUMIN: 3.7 g/dL (ref 3.5–5.0)
ALK PHOS: 59 U/L (ref 38–126)
ALT: 14 U/L — ABNORMAL LOW (ref 17–63)
AST: 16 U/L (ref 15–41)
Anion gap: 9 (ref 5–15)
BILIRUBIN TOTAL: 0.5 mg/dL (ref 0.3–1.2)
BUN: 18 mg/dL (ref 6–20)
CALCIUM: 9.3 mg/dL (ref 8.9–10.3)
CO2: 22 mmol/L (ref 22–32)
Chloride: 105 mmol/L (ref 101–111)
Creatinine, Ser: 1.51 mg/dL — ABNORMAL HIGH (ref 0.61–1.24)
GFR calc Af Amer: >60 mL/min (ref >60)
GFR calc non Af Amer: 58 mL/min — ABNORMAL LOW (ref >60)
GLUCOSE: 95 mg/dL (ref 65–99)
Potassium: 3.8 mmol/L (ref 3.5–5.1)
Sodium: 136 mmol/L (ref 135–145)
TOTAL PROTEIN: 7.5 g/dL (ref 6.5–8.1)

## 2017-07-30 LAB — CBC WITH DIFFERENTIAL/PLATELET
BASOS ABS: 0 10*3/uL (ref 0.0–0.1)
BASOS PCT: 1 %
Eosinophils Absolute: 0.2 10*3/uL (ref 0.0–0.7)
Eosinophils Relative: 3 %
HEMATOCRIT: 45.2 % (ref 39.0–52.0)
HEMOGLOBIN: 14.9 g/dL (ref 13.0–17.0)
Lymphocytes Relative: 35 %
Lymphs Abs: 1.8 10*3/uL (ref 0.7–4.0)
MCH: 27.1 pg (ref 26.0–34.0)
MCHC: 33 g/dL (ref 30.0–36.0)
MCV: 82.3 fL (ref 78.0–100.0)
Monocytes Absolute: 0.4 10*3/uL (ref 0.1–1.0)
Monocytes Relative: 8 %
NEUTROS ABS: 2.7 10*3/uL (ref 1.7–7.7)
NEUTROS PCT: 53 %
Platelets: 247 10*3/uL (ref 150–400)
RBC: 5.49 MIL/uL (ref 4.22–5.81)
RDW: 13.8 % (ref 11.5–15.5)
WBC: 5.1 10*3/uL (ref 4.0–10.5)

## 2017-07-30 LAB — MAGNESIUM: MAGNESIUM: 1.9 mg/dL (ref 1.7–2.4)

## 2017-07-30 LAB — TROPONIN I
TROPONIN I: 0.08 ng/mL — AB (ref <0.03)
Troponin I: 0.08 ng/mL (ref <0.03)

## 2017-07-30 LAB — BRAIN NATRIURETIC PEPTIDE: B Natriuretic Peptide: 247 pg/mL — ABNORMAL HIGH (ref 0.0–100.0)

## 2017-07-30 MED ORDER — MECLIZINE HCL 25 MG PO TABS: 25.0000 mg | ORAL_TABLET | Freq: Three times a day (TID) | ORAL | 0 refills | Status: DC | PRN

## 2017-07-30 NOTE — ED Notes (Signed)
Date and time results received: 07/30/17 1201 (use smartphrase ".now" to insert current time)  Test: trop Critical Value: 0.08  Name of Provider Notified: [email or web address]   Orders Received? Or Actions Taken?: none

## 2017-07-30 NOTE — Telephone Encounter (Signed)
Pt was at [facility] re[facility] warming up and almost had a syncopal episode. Patient states he has been experiencing these symptoms ever since valve replacement. Had a similar incident yesterday at home. I advised they take him to the emergency room for evaluation. Diane agreed with plan and would follow up with me if patient refused to go to the emergency room.

## 2017-07-30 NOTE — ED Provider Notes (Signed)
[facility] EMERGENCY DEPARTMENT Provider Note   CSN: [id number] Arrival date & time: 07/30/17  1012     History   Chief Complaint Chief Complaint  Patient presents with  . Palpitations    HPI COLUM COLT is a 37 y.o. male.  HPI States he was at cardiac rehab today and began having dizziness.  Describes dizziness as spinning sensation.  Had some nausea and diaphoresis, as well as, palpitations.  Denies chest pain at any point.  No new lower extremity swelling or pain.  Patient currently is asymptomatic Past Medical History:  Diagnosis Date  . Anxiety   . CHF (congestive heart failure) ([place])   . Chronic systolic heart failure ([place])   . CKD (chronic kidney disease) stage 2, GFR 60-89 ml/min   . Dyspnea    with value issues- "if I dont take my medication"  . Essential hypertension, benign   . Headache   . History of pneumonia   . Mitral regurgitation    Moderate  . Noncompliance   . Nonischemic cardiomyopathy ([place])    Normal coronaries May 2012, LVEF < 20%  . Pneumonia 2011  . S/P aortic valve repair 05/11/2017   Complex valvuloplasty including plication of left coronary leaflet and 72mm Biostable HAART ring annuloplasty    Patient Active Problem List   Diagnosis Date Noted  . S/P aortic valve repair 05/11/2017  . Aortic valve regurgitation   . Chronic systolic CHF (congestive heart failure) ([place])   . Hypertensive emergency 05/30/2015  . Essential hypertension 11/22/2014  . Shift work sleep disorder 10/30/2014  . Obstructive sleep apnea 10/30/2014  . Hypertensive crisis 09/26/2014  . Hypertensive urgency 06/13/2013  . CHF (congestive heart failure) ([place]) 06/13/2013  . CKD (chronic kidney disease) stage 3, GFR 30-59 ml/min (HCC) 06/13/2013  . Nonischemic cardiomyopathy ([place]) 06/13/2013  . HTN (hypertension) 02/25/2012  . Chronic combined systolic and diastolic heart failure ([place]) 01/08/2012  . Elevated troponin 01/01/2012  . Hypokalemia 12/31/2011  .  Chest pain 12/31/2011    Past Surgical History:  Procedure Laterality Date  . AORTIC VALVE REPAIR N/A 05/11/2017   Procedure: AORTIC VALVE REPAIR;  Surgeon: Rexene Alberts, MD;  Location: [place];  Service: Open Heart Surgery;  Laterality: N/A;  . RIGHT/LEFT HEART CATH AND CORONARY ANGIOGRAPHY N/A 04/20/2017   Procedure: RIGHT/LEFT HEART CATH AND CORONARY ANGIOGRAPHY;  Surgeon: Jolaine Artist, MD;  Location: [place] CV LAB;  Service: Cardiovascular;  Laterality: N/A;  . SURGERY SCROTAL / TESTICULAR     Testicular torsion  . TEE WITHOUT CARDIOVERSION N/A 04/09/2017   Procedure: TRANSESOPHAGEAL ECHOCARDIOGRAM (TEE);  Surgeon: Jolaine Artist, MD;  Location: [facility] ENDOSCOPY;  Service: Cardiovascular;  Laterality: N/A;  . TEE WITHOUT CARDIOVERSION N/A 05/11/2017   Procedure: TRANSESOPHAGEAL ECHOCARDIOGRAM (TEE);  Surgeon: Rexene Alberts, MD;  Location: [place];  Service: Open Heart Surgery;  Laterality: N/A;       Home Medications    Prior to Admission medications   Medication Sig Start Date End Date Taking? Authorizing Provider  acetaminophen (TYLENOL) 500 MG tablet Take 1,000 mg by mouth every 6 (six) hours as needed for moderate pain or headache.    Yes [provider]  carvedilol (COREG) 25 MG tablet Take 1 tablet (25 mg total) by mouth 2 (two) times daily with a meal. 11/18/16  Yes Bensimhon, Shaune Pascal, MD  ENTRESTO 97-103 MG TAKE 1 TABLET BY MOUTH TWICE DAILY 05/06/17  Yes Bensimhon, Shaune Pascal, MD  furosemide (LASIX) 40  MG tablet Take 1 tablet (40 mg total) 2 (two) times daily by mouth. Patient taking differently: Take 40 mg by mouth daily.  05/16/17  Yes Lars Pinks M, PA-C  hydrALAZINE (APRESOLINE) 25 MG tablet Take 1 tablet (25 mg total) by mouth 3 (three) times daily. 04/01/17 07/30/17 Yes Bensimhon, Shaune Pascal, MD  aspirin EC 325 MG EC tablet Take 1 tablet (325 mg total) daily by mouth. Patient not taking: Reported on 07/30/2017 05/16/17   Nani Skillern,  PA-C  meclizine (ANTIVERT) 25 MG tablet Take 1 tablet (25 mg total) by mouth 3 (three) times daily as needed for dizziness. 07/30/17   Julianne Rice, MD  traMADol (ULTRAM) 50 MG tablet Take 1 tablet (50 mg total) as directed by mouth. Take 1-2 tab every 6 hours as needed for pain, no more than 10 tablets a day Patient not taking: Reported on 07/23/2017 05/25/17   Rexene Alberts, MD    Family History Family History  Problem Relation Age of Onset  . Breast cancer Mother   . Stroke Father     Social History Social History   Tobacco Use  . Smoking status: Never Smoker  . Smokeless tobacco: Never Used  Substance Use Topics  . Alcohol use: Yes    Alcohol/week: 0.0 oz    Comment: Occasionally  . Drug use: No     Allergies   Patient has no known allergies.   Review of Systems Review of Systems  Constitutional: Negative for chills and fever.  HENT: Negative for congestion, ear pain, sinus pressure, sinus pain and tinnitus.   Eyes: Negative for visual disturbance.  Respiratory: Negative for cough and shortness of breath.   Cardiovascular: Positive for palpitations. Negative for chest pain and leg swelling.  Gastrointestinal: Positive for nausea. Negative for abdominal pain and vomiting.  Musculoskeletal: Negative for back pain and neck pain.  Skin: Negative for rash and wound.  Neurological: Positive for dizziness and light-headedness. Negative for syncope, weakness and numbness.  All other systems reviewed and are negative.    Physical Exam Updated Vital Signs BP (!) 144/87   Pulse 72   Temp 97.8 F (36.6 C) (Oral)   Resp (!) 24   Ht 5\' 10"  (1.778 m)   Wt 102.1 kg (225 lb)   SpO2 92%   BMI 32.28 kg/m   Physical Exam  Constitutional: He is oriented to person, place, and time. He appears well-developed and well-nourished. No distress.  HENT:  Head: Normocephalic and atraumatic.  Mouth/Throat: Oropharynx is clear and moist. No oropharyngeal exudate.  Bilateral  nasal mucosal edema.  No sinus tenderness to percussion.  Eyes: EOM are normal. Pupils are equal, round, and reactive to light.  No nystagmus  Neck: Normal range of motion. Neck supple.  Cardiovascular: Normal rate and regular rhythm. Exam reveals no gallop and no friction rub.  Murmur heard. Pulmonary/Chest: Effort normal and breath sounds normal. No stridor. No respiratory distress. He has no wheezes. He has no rales. He exhibits no tenderness.  Abdominal: Soft. Bowel sounds are normal. There is no tenderness. There is no rebound and no guarding.  Musculoskeletal: Normal range of motion. He exhibits no edema or tenderness.  No lower extremity swelling, asymmetry or tenderness.  Neurological: He is alert and oriented to person, place, and time.  Moving all extremities without focal deficit.  Sensation fully intact.  Skin: Skin is warm and dry. Capillary refill takes less than 2 seconds. No rash noted. No erythema.  Psychiatric: He has  a normal mood and affect. His behavior is normal.  Nursing note and vitals reviewed.    ED Treatments / Results  Labs (all labs ordered are listed, but only abnormal results are displayed) Labs Reviewed  COMPREHENSIVE METABOLIC PANEL - Abnormal; Notable for the following components:      Result Value   Creatinine, Ser 1.51 (*)    ALT 14 (*)    GFR calc non Af Amer 58 (*)    All other components within normal limits  TROPONIN I - Abnormal; Notable for the following components:   Troponin I 0.08 (*)    All other components within normal limits  BRAIN NATRIURETIC PEPTIDE - Abnormal; Notable for the following components:   B Natriuretic Peptide 247.0 (*)    All other components within normal limits  TROPONIN I - Abnormal; Notable for the following components:   Troponin I 0.08 (*)    All other components within normal limits  CBC WITH DIFFERENTIAL/PLATELET  MAGNESIUM    EKG  EKG Interpretation None     ED ECG REPORT   Date: 07/30/2017   Rate: 79  Rhythm: normal sinus rhythm  QRS Axis: left  Intervals: normal  ST/T Wave abnormalities: nonspecific T wave changes  Conduction Disutrbances:nonspecific intraventricular conduction delay  Narrative Interpretation:   Old EKG Reviewed: unchanged Patient with inverted T waves in lateral leads consistent with previous EKG. I have personally reviewed the EKG tracing and agree with the computerized printout as noted.  Radiology Dg Chest 2 View  Result Date: 07/30/2017 CLINICAL DATA:  With palpitations. EXAM: CHEST  2 VIEW COMPARISON:  Chest x-ray dated June 14, 2017. FINDINGS: Stable mild cardiomegaly status post aortic valve repair. Normal pulmonary vascularity. No focal consolidation, pleural effusion, or pneumothorax. No acute osseous abnormality. IMPRESSION: Stable mild cardiomegaly.  No active cardiopulmonary disease. Electronically Signed   By: Titus Dubin M.D.   On: 07/30/2017 12:12    Procedures Procedures (including critical care time)  Medications Ordered in ED Medications - No data to display   Initial Impression / Assessment and Plan / ED Course  I have reviewed the triage vital signs and the nursing notes.  Pertinent labs & imaging results that were available during my care of the patient were reviewed by me and considered in my medical decision making (see chart for details).    Patient continues to be asymptomatic.  Mild elevation in troponin but is stable with delta troponin.  No chest pain at any point.  Appears to be chronically elevated.  Patient does have some mild orthostasis this may be related to his medications.  Description of dizziness as vertiginous.  Will treat symptomatically with meclizine.  Low suspicion for central cause given normal neurologic exam.  Advised follow-up with his cardiologist and return precautions have been given.  Final Clinical Impressions(s) / ED Diagnoses   Final diagnoses:  Palpitations  Dizziness    ED Discharge  Orders        Ordered    meclizine (ANTIVERT) 25 MG tablet  3 times daily PRN     07/30/17 1425       Julianne Rice, MD 07/30/17 1433

## 2017-07-30 NOTE — ED Notes (Signed)
Patient transported to X-ray 

## 2017-07-30 NOTE — ED Triage Notes (Signed)
Pt reports having episodes of palpitations since having aortic valve repair on October 30th.  Today was in cardiac rehab (day 3) and began having palpitations, blurred vision, and thought he was going to pass out.  Denies complaints now.

## 2017-07-30 NOTE — Progress Notes (Signed)
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  Patient checked in today with no complaints. VS WNL: Initial b/p was 126/80. At the end of weights, patient stumbled and held to wall and said he was dizzy. Patient was assisted to a chair. He was slightly diaphoretic. Blood pressure was 100/60. He stated he had double vision with his dizziness. We called Dr. Clayborne Dana office to report episode and see if he could be seen. They could not see him today and advised him to go to ED. Patient's monitor showed tachycardia and he also reported feeling palpitations today before he started and last night. Patient transported to ED. He was feeling better, but not himself.

## 2017-08-02 ENCOUNTER — Encounter (HOSPITAL_COMMUNITY): Payer: BLUE CROSS/BLUE SHIELD

## 2017-08-04 ENCOUNTER — Encounter (HOSPITAL_COMMUNITY)
Admission: RE | Admit: 2017-08-04 | Discharge: 2017-08-04 | Disposition: A | Discharge status: Home / Self Care | Payer: BLUE CROSS/BLUE SHIELD | Source: Ambulatory Visit | Attending: Thoracic Surgery (Cardiothoracic Vascular Surgery) | Admitting: Thoracic Surgery (Cardiothoracic Vascular Surgery)

## 2017-08-04 DIAGNOSIS — Z9889 Other specified postprocedural states: Secondary | ICD-10-CM

## 2017-08-04 DIAGNOSIS — I429 Cardiomyopathy, unspecified: Secondary | ICD-10-CM | POA: Diagnosis not present

## 2017-08-04 DIAGNOSIS — I5042 Chronic combined systolic (congestive) and diastolic (congestive) heart failure: Secondary | ICD-10-CM | POA: Diagnosis not present

## 2017-08-04 DIAGNOSIS — I351 Nonrheumatic aortic (valve) insufficiency: Secondary | ICD-10-CM | POA: Diagnosis not present

## 2017-08-04 DIAGNOSIS — N182 Chronic kidney disease, stage 2 (mild): Secondary | ICD-10-CM | POA: Diagnosis not present

## 2017-08-04 DIAGNOSIS — I130 Hypertensive heart and chronic kidney disease with heart failure and stage 1 through stage 4 chronic kidney disease, or unspecified chronic kidney disease: Secondary | ICD-10-CM | POA: Diagnosis not present

## 2017-08-04 DIAGNOSIS — F419 Anxiety disorder, unspecified: Secondary | ICD-10-CM | POA: Diagnosis not present

## 2017-08-04 NOTE — Progress Notes (Signed)
Daily Session Note  Patient Details  Name: [name] [name] MRN: [id number] Date of Birth: 02-11-1981 Referring Provider:     CARDIAC REHAB PHASE II ORIENTATION from 07/23/2017 in [place]  Referring Provider  Dr. Roxy Manns      Encounter Date: 08/04/2017  Check In: Session Check In - 08/04/17 0930      Check-In   Location  [facility]    Staff Present  Suzanne Boron, BS, EP, Exercise Physiologist;Jilliana Burkes Wynetta Emery, RN, BSN;Diane Coad, MS, EP, [facility], Exercise Physiologist    Supervising physician immediately available to respond to emergencies  See telemetry face sheet for immediately available MD    Medication changes reported      No    Fall or balance concerns reported     No    Warm-up and Cool-down  Performed as group-led instruction    Resistance Training Performed  Yes    VAD Patient?  No      Pain Assessment   Currently in Pain?  No/denies    Pain Score  0-No pain    Multiple Pain Sites  No       Capillary Blood Glucose: No results found for this or any previous visit (from the past 24 hour(s)).    Social History   Tobacco Use  Smoking Status Never Smoker  Smokeless Tobacco Never Used    Goals Met:  Independence with exercise equipment Exercise tolerated well No report of cardiac concerns or symptoms Strength training completed today  Goals Unmet:  Not Applicable  Comments: Check out 1030.   Dr. Kate Sable is Medical Director for [facility] Cardiac and Pulmonary Rehab.

## 2017-08-06 ENCOUNTER — Encounter (HOSPITAL_COMMUNITY)
Admission: RE | Admit: 2017-08-06 | Discharge: 2017-08-06 | Disposition: A | Discharge status: Home / Self Care | Payer: BLUE CROSS/BLUE SHIELD | Source: Ambulatory Visit | Attending: Thoracic Surgery (Cardiothoracic Vascular Surgery) | Admitting: Thoracic Surgery (Cardiothoracic Vascular Surgery)

## 2017-08-06 DIAGNOSIS — Z9889 Other specified postprocedural states: Secondary | ICD-10-CM

## 2017-08-06 DIAGNOSIS — I130 Hypertensive heart and chronic kidney disease with heart failure and stage 1 through stage 4 chronic kidney disease, or unspecified chronic kidney disease: Secondary | ICD-10-CM | POA: Diagnosis not present

## 2017-08-06 DIAGNOSIS — F419 Anxiety disorder, unspecified: Secondary | ICD-10-CM | POA: Diagnosis not present

## 2017-08-06 DIAGNOSIS — I429 Cardiomyopathy, unspecified: Secondary | ICD-10-CM | POA: Diagnosis not present

## 2017-08-06 DIAGNOSIS — I5042 Chronic combined systolic (congestive) and diastolic (congestive) heart failure: Secondary | ICD-10-CM | POA: Diagnosis not present

## 2017-08-06 DIAGNOSIS — N182 Chronic kidney disease, stage 2 (mild): Secondary | ICD-10-CM | POA: Diagnosis not present

## 2017-08-06 DIAGNOSIS — I351 Nonrheumatic aortic (valve) insufficiency: Secondary | ICD-10-CM | POA: Diagnosis not present

## 2017-08-06 NOTE — Progress Notes (Signed)
Daily Session Note  Patient Details  Name: TITUS DRONE MRN: [id number] Date of Birth: [birth year] Referring Provider:     CARDIAC REHAB PHASE II ORIENTATION from 07/23/2017 in [place]  Referring Provider  Dr. Roxy Manns      Encounter Date: 08/06/2017  Check In: Session Check In - 08/06/17 0930      Check-In   Location  [facility]    Staff Present  Suzanne Boron, BS, EP, Exercise Physiologist;Takyah Ciaramitaro Wynetta Emery, RN, BSN;Diane Coad, MS, EP, [facility], Exercise Physiologist    Supervising physician immediately available to respond to emergencies  See telemetry face sheet for immediately available MD    Medication changes reported      No    Fall or balance concerns reported     No    Warm-up and Cool-down  Performed as group-led instruction    Resistance Training Performed  Yes    VAD Patient?  No      Pain Assessment   Currently in Pain?  No/denies    Pain Score  0-No pain    Multiple Pain Sites  No       Capillary Blood Glucose: No results found for this or any previous visit (from the past 24 hour(s)).    Social History   Tobacco Use  Smoking Status Never Smoker  Smokeless Tobacco Never Used    Goals Met:  Independence with exercise equipment Exercise tolerated well No report of cardiac concerns or symptoms Strength training completed today  Goals Unmet:  Not Applicable  Comments: Check out 1030.   Dr. Kate Sable is Medical Director for [facility] Cardiac and Pulmonary Rehab.

## 2017-08-09 ENCOUNTER — Encounter (HOSPITAL_COMMUNITY)
Admission: RE | Admit: 2017-08-09 | Discharge: 2017-08-09 | Disposition: A | Discharge status: Home / Self Care | Payer: BLUE CROSS/BLUE SHIELD | Source: Ambulatory Visit | Attending: Thoracic Surgery (Cardiothoracic Vascular Surgery) | Admitting: Thoracic Surgery (Cardiothoracic Vascular Surgery)

## 2017-08-09 DIAGNOSIS — Z9889 Other specified postprocedural states: Secondary | ICD-10-CM

## 2017-08-09 NOTE — Progress Notes (Signed)
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  His initial b/p reading at check in was 200/102. He has been without his medication for 2 days. He plans to go immediately to pharmacy and pick up his medication.

## 2017-08-11 ENCOUNTER — Encounter (HOSPITAL_COMMUNITY)
Admission: RE | Admit: 2017-08-11 | Discharge: 2017-08-11 | Disposition: A | Discharge status: Home / Self Care | Payer: BLUE CROSS/BLUE SHIELD | Source: Ambulatory Visit | Attending: Thoracic Surgery (Cardiothoracic Vascular Surgery) | Admitting: Thoracic Surgery (Cardiothoracic Vascular Surgery)

## 2017-08-11 DIAGNOSIS — Z9889 Other specified postprocedural states: Secondary | ICD-10-CM | POA: Diagnosis not present

## 2017-08-11 DIAGNOSIS — N182 Chronic kidney disease, stage 2 (mild): Secondary | ICD-10-CM | POA: Diagnosis not present

## 2017-08-11 DIAGNOSIS — I5042 Chronic combined systolic (congestive) and diastolic (congestive) heart failure: Secondary | ICD-10-CM | POA: Diagnosis not present

## 2017-08-11 DIAGNOSIS — F419 Anxiety disorder, unspecified: Secondary | ICD-10-CM | POA: Diagnosis not present

## 2017-08-11 DIAGNOSIS — I429 Cardiomyopathy, unspecified: Secondary | ICD-10-CM | POA: Diagnosis not present

## 2017-08-11 DIAGNOSIS — I351 Nonrheumatic aortic (valve) insufficiency: Secondary | ICD-10-CM | POA: Diagnosis not present

## 2017-08-11 DIAGNOSIS — I130 Hypertensive heart and chronic kidney disease with heart failure and stage 1 through stage 4 chronic kidney disease, or unspecified chronic kidney disease: Secondary | ICD-10-CM | POA: Diagnosis not present

## 2017-08-11 NOTE — Progress Notes (Signed)
Daily Session Note  Patient Details  Name: [name] [name] MRN: [id number] Date of Birth: [birth year] Referring Provider:     CARDIAC REHAB PHASE II ORIENTATION from 07/23/2017 in [place]  Referring Provider  Dr. Roxy Manns      Encounter Date: 08/11/2017  Check In: Session Check In - 08/11/17 0930      Check-In   Location  [facility]    Staff Present  Suzanne Boron, BS, EP, Exercise Physiologist;Adesuwa Osgood Wynetta Emery, RN, BSN;Diane Coad, MS, EP, [facility], Exercise Physiologist    Supervising physician immediately available to respond to emergencies  See telemetry face sheet for immediately available MD    Medication changes reported      No    Fall or balance concerns reported     No    Warm-up and Cool-down  Performed as group-led instruction    Resistance Training Performed  Yes    VAD Patient?  No      Pain Assessment   Currently in Pain?  No/denies    Pain Score  0-No pain    Multiple Pain Sites  No       Capillary Blood Glucose: No results found for this or any previous visit (from the past 24 hour(s)).    Social History   Tobacco Use  Smoking Status Never Smoker  Smokeless Tobacco Never Used    Goals Met:  Independence with exercise equipment Exercise tolerated well No report of cardiac concerns or symptoms Strength training completed today  Goals Unmet:  Not Applicable  Comments: Check out 1030.   Dr. Kate Sable is Medical Director for [facility] Cardiac and Pulmonary Rehab.

## 2017-08-13 ENCOUNTER — Encounter (HOSPITAL_COMMUNITY)
Admission: RE | Admit: 2017-08-13 | Discharge: 2017-08-13 | Disposition: A | Discharge status: Home / Self Care | Payer: BLUE CROSS/BLUE SHIELD | Source: Ambulatory Visit | Attending: Thoracic Surgery (Cardiothoracic Vascular Surgery) | Admitting: Thoracic Surgery (Cardiothoracic Vascular Surgery)

## 2017-08-13 DIAGNOSIS — I5042 Chronic combined systolic (congestive) and diastolic (congestive) heart failure: Secondary | ICD-10-CM | POA: Diagnosis not present

## 2017-08-13 DIAGNOSIS — N182 Chronic kidney disease, stage 2 (mild): Secondary | ICD-10-CM | POA: Diagnosis not present

## 2017-08-13 DIAGNOSIS — I429 Cardiomyopathy, unspecified: Secondary | ICD-10-CM | POA: Insufficient documentation

## 2017-08-13 DIAGNOSIS — Z9889 Other specified postprocedural states: Secondary | ICD-10-CM | POA: Diagnosis not present

## 2017-08-13 DIAGNOSIS — I130 Hypertensive heart and chronic kidney disease with heart failure and stage 1 through stage 4 chronic kidney disease, or unspecified chronic kidney disease: Secondary | ICD-10-CM | POA: Diagnosis not present

## 2017-08-13 DIAGNOSIS — F419 Anxiety disorder, unspecified: Secondary | ICD-10-CM | POA: Diagnosis not present

## 2017-08-13 DIAGNOSIS — I351 Nonrheumatic aortic (valve) insufficiency: Secondary | ICD-10-CM | POA: Diagnosis not present

## 2017-08-13 NOTE — Progress Notes (Signed)
Daily Session Note  Patient Details  Name: IZAAN KINGBIRD MRN: [id number] Date of Birth: 10-Mar-1981 Referring Provider:     CARDIAC REHAB PHASE II ORIENTATION from 07/23/2017 in [place]  Referring Provider  Dr. Roxy Manns      Encounter Date: 08/13/2017  Check In: Session Check In - 08/13/17 0930      Check-In   Location  [facility]    Staff Present  Suzanne Boron, BS, EP, Exercise Physiologist;Jahmil Macleod Wynetta Emery, RN, BSN;Diane Coad, MS, EP, [facility], Exercise Physiologist    Supervising physician immediately available to respond to emergencies  See telemetry face sheet for immediately available MD    Medication changes reported      No    Fall or balance concerns reported     No    Warm-up and Cool-down  Performed as group-led instruction    Resistance Training Performed  Yes    VAD Patient?  No      Pain Assessment   Currently in Pain?  No/denies    Pain Score  0-No pain    Multiple Pain Sites  No       Capillary Blood Glucose: No results found for this or any previous visit (from the past 24 hour(s)).    Social History   Tobacco Use  Smoking Status Never Smoker  Smokeless Tobacco Never Used    Goals Met:  Independence with exercise equipment Exercise tolerated well No report of cardiac concerns or symptoms Strength training completed today  Goals Unmet:  Not Applicable  Comments: Check out 1030.   Dr. Kate Sable is Medical Director for [facility] Cardiac and Pulmonary Rehab.

## 2017-08-16 ENCOUNTER — Encounter: Payer: BLUE CROSS/BLUE SHIELD | Admitting: Thoracic Surgery (Cardiothoracic Vascular Surgery)

## 2017-08-16 ENCOUNTER — Encounter (HOSPITAL_COMMUNITY)
Admission: RE | Admit: 2017-08-16 | Discharge: 2017-08-16 | Disposition: A | Discharge status: Home / Self Care | Payer: BLUE CROSS/BLUE SHIELD | Source: Ambulatory Visit | Attending: Thoracic Surgery (Cardiothoracic Vascular Surgery) | Admitting: Thoracic Surgery (Cardiothoracic Vascular Surgery)

## 2017-08-16 DIAGNOSIS — I130 Hypertensive heart and chronic kidney disease with heart failure and stage 1 through stage 4 chronic kidney disease, or unspecified chronic kidney disease: Secondary | ICD-10-CM | POA: Diagnosis not present

## 2017-08-16 DIAGNOSIS — I429 Cardiomyopathy, unspecified: Secondary | ICD-10-CM | POA: Diagnosis not present

## 2017-08-16 DIAGNOSIS — I351 Nonrheumatic aortic (valve) insufficiency: Secondary | ICD-10-CM | POA: Diagnosis not present

## 2017-08-16 DIAGNOSIS — Z9889 Other specified postprocedural states: Secondary | ICD-10-CM

## 2017-08-16 DIAGNOSIS — I5042 Chronic combined systolic (congestive) and diastolic (congestive) heart failure: Secondary | ICD-10-CM | POA: Diagnosis not present

## 2017-08-16 DIAGNOSIS — F419 Anxiety disorder, unspecified: Secondary | ICD-10-CM | POA: Diagnosis not present

## 2017-08-16 DIAGNOSIS — N182 Chronic kidney disease, stage 2 (mild): Secondary | ICD-10-CM | POA: Diagnosis not present

## 2017-08-16 NOTE — Progress Notes (Signed)
Daily Session Note  Patient Details  Name: HATCHER FRONING MRN: [id number] Date of Birth: Jan 06, 1981 Referring Provider:     CARDIAC REHAB PHASE II ORIENTATION from 07/23/2017 in [place]  Referring Provider  Dr. Roxy Manns      Encounter Date: 08/16/2017  Check In: Session Check In - 08/16/17 0930      Check-In   Location  [facility]    Staff Present  Suzanne Boron, BS, EP, Exercise Physiologist;Sabrina Arriaga Wynetta Emery, RN, BSN;Diane Coad, MS, EP, [facility], Exercise Physiologist    Supervising physician immediately available to respond to emergencies  See telemetry face sheet for immediately available MD    Medication changes reported      No    Fall or balance concerns reported     No    Warm-up and Cool-down  Performed as group-led instruction    Resistance Training Performed  Yes    VAD Patient?  No      Pain Assessment   Currently in Pain?  No/denies    Pain Score  0-No pain    Multiple Pain Sites  No       Capillary Blood Glucose: No results found for this or any previous visit (from the past 24 hour(s)).    Social History   Tobacco Use  Smoking Status Never Smoker  Smokeless Tobacco Never Used    Goals Met:  Independence with exercise equipment Exercise tolerated well No report of cardiac concerns or symptoms Strength training completed today  Goals Unmet:  Not Applicable  Comments: Check out 1030.   Dr. Kate Sable is Medical Director for [facility] Cardiac and Pulmonary Rehab.

## 2017-08-18 ENCOUNTER — Encounter (HOSPITAL_COMMUNITY): Payer: BLUE CROSS/BLUE SHIELD

## 2017-08-20 ENCOUNTER — Encounter (HOSPITAL_COMMUNITY)
Admission: RE | Admit: 2017-08-20 | Discharge: 2017-08-20 | Disposition: A | Discharge status: Home / Self Care | Payer: BLUE CROSS/BLUE SHIELD | Source: Ambulatory Visit | Attending: Thoracic Surgery (Cardiothoracic Vascular Surgery) | Admitting: Thoracic Surgery (Cardiothoracic Vascular Surgery)

## 2017-08-20 DIAGNOSIS — F419 Anxiety disorder, unspecified: Secondary | ICD-10-CM | POA: Diagnosis not present

## 2017-08-20 DIAGNOSIS — N182 Chronic kidney disease, stage 2 (mild): Secondary | ICD-10-CM | POA: Diagnosis not present

## 2017-08-20 DIAGNOSIS — I351 Nonrheumatic aortic (valve) insufficiency: Secondary | ICD-10-CM | POA: Diagnosis not present

## 2017-08-20 DIAGNOSIS — Z9889 Other specified postprocedural states: Secondary | ICD-10-CM | POA: Diagnosis not present

## 2017-08-20 DIAGNOSIS — I130 Hypertensive heart and chronic kidney disease with heart failure and stage 1 through stage 4 chronic kidney disease, or unspecified chronic kidney disease: Secondary | ICD-10-CM | POA: Diagnosis not present

## 2017-08-20 DIAGNOSIS — I429 Cardiomyopathy, unspecified: Secondary | ICD-10-CM | POA: Diagnosis not present

## 2017-08-20 DIAGNOSIS — I5042 Chronic combined systolic (congestive) and diastolic (congestive) heart failure: Secondary | ICD-10-CM | POA: Diagnosis not present

## 2017-08-20 NOTE — Progress Notes (Signed)
Daily Session Note  Patient Details  Name: [name] [name] MRN: [id number] Date of Birth: 10/11/1980 Referring Provider:     CARDIAC REHAB PHASE II ORIENTATION from 07/23/2017 in [place] CARDIAC REHABILITATION  Referring Provider  Dr. Owen      Encounter Date: 08/20/2017  Check In: Session Check In - 08/20/17 0930      Check-In   Location  [facility]    Staff Present  Gregory Cowan, BS, EP, Exercise Physiologist;Debra Johnson, RN, BSN;Diane Coad, MS, EP, [facility], Exercise Physiologist    Supervising physician immediately available to respond to emergencies  See telemetry face sheet for immediately available MD    Medication changes reported      No    Fall or balance concerns reported     No    Warm-up and Cool-down  Performed as group-led instruction    Resistance Training Performed  Yes    VAD Patient?  No      Pain Assessment   Currently in Pain?  No/denies    Pain Score  0-No pain    Multiple Pain Sites  No       Capillary Blood Glucose: No results found for this or any previous visit (from the past 24 hour(s)).    Social History   Tobacco Use  Smoking Status Never Smoker  Smokeless Tobacco Never Used    Goals Met:  Independence with exercise equipment Exercise tolerated well No report of cardiac concerns or symptoms Strength training completed today  Goals Unmet:  Not Applicable  Comments: Check out 1030.   Dr. Suresh Koneswaran is Medical Director for [place] Cardiac and Pulmonary Rehab. 

## 2017-08-23 ENCOUNTER — Encounter (HOSPITAL_COMMUNITY)
Admission: RE | Admit: 2017-08-23 | Discharge: 2017-08-23 | Disposition: A | Discharge status: Home / Self Care | Payer: BLUE CROSS/BLUE SHIELD | Source: Ambulatory Visit | Attending: Thoracic Surgery (Cardiothoracic Vascular Surgery) | Admitting: Thoracic Surgery (Cardiothoracic Vascular Surgery)

## 2017-08-23 DIAGNOSIS — Z9889 Other specified postprocedural states: Secondary | ICD-10-CM

## 2017-08-23 DIAGNOSIS — I429 Cardiomyopathy, unspecified: Secondary | ICD-10-CM | POA: Diagnosis not present

## 2017-08-23 DIAGNOSIS — I130 Hypertensive heart and chronic kidney disease with heart failure and stage 1 through stage 4 chronic kidney disease, or unspecified chronic kidney disease: Secondary | ICD-10-CM | POA: Diagnosis not present

## 2017-08-23 DIAGNOSIS — I351 Nonrheumatic aortic (valve) insufficiency: Secondary | ICD-10-CM | POA: Diagnosis not present

## 2017-08-23 DIAGNOSIS — I5042 Chronic combined systolic (congestive) and diastolic (congestive) heart failure: Secondary | ICD-10-CM | POA: Diagnosis not present

## 2017-08-23 DIAGNOSIS — F419 Anxiety disorder, unspecified: Secondary | ICD-10-CM | POA: Diagnosis not present

## 2017-08-23 DIAGNOSIS — N182 Chronic kidney disease, stage 2 (mild): Secondary | ICD-10-CM | POA: Diagnosis not present

## 2017-08-23 NOTE — Progress Notes (Signed)
Cardiac Individual Treatment Plan  Patient Details  Name: [name] [name] MRN: [id number] Date of Birth: 1981/03/23 Referring Provider:     [place] from 07/23/2017 in [place]  Referring Provider  Dr. Roxy Manns      Initial Encounter Date:    CARDIAC REHAB PHASE II ORIENTATION from 07/23/2017 in [place]  Date  07/23/17  Referring Provider  Dr. Roxy Manns      Visit Diagnosis: S/P aortic valve repair  Patient's Home Medications on Admission:  Current Outpatient Medications:  .  acetaminophen (TYLENOL) 500 MG tablet, Take 1,000 mg by mouth every 6 (six) hours as needed for moderate pain or headache. , Disp: , Rfl:  .  aspirin EC 325 MG EC tablet, Take 1 tablet (325 mg total) daily by mouth. (Patient not taking: Reported on 07/30/2017), Disp: 30 tablet, Rfl: 0 .  carvedilol (COREG) 25 MG tablet, Take 1 tablet (25 mg total) by mouth 2 (two) times daily with a meal., Disp: 60 tablet, Rfl: 6 .  ENTRESTO 97-103 MG, TAKE 1 TABLET BY MOUTH TWICE DAILY, Disp: 60 tablet, Rfl: 3 .  furosemide (LASIX) 40 MG tablet, Take 1 tablet (40 mg total) 2 (two) times daily by mouth. (Patient taking differently: Take 40 mg by mouth daily. ), Disp: 60 tablet, Rfl: 1 .  hydrALAZINE (APRESOLINE) 25 MG tablet, Take 1 tablet (25 mg total) by mouth 3 (three) times daily., Disp: 90 tablet, Rfl: 3 .  meclizine (ANTIVERT) 25 MG tablet, Take 1 tablet (25 mg total) by mouth 3 (three) times daily as needed for dizziness., Disp: 30 tablet, Rfl: 0 .  traMADol (ULTRAM) 50 MG tablet, Take 1 tablet (50 mg total) as directed by mouth. Take 1-2 tab every 6 hours as needed for pain, no more than 10 tablets a day (Patient not taking: Reported on 07/23/2017), Disp: 60 tablet, Rfl: 0  Past Medical History: Past Medical History:  Diagnosis Date  . Anxiety   . CHF (congestive heart failure) ([place])   . Chronic systolic heart failure ([place])   . CKD (chronic kidney  disease) stage 2, GFR 60-89 ml/min   . Dyspnea    with value issues- "if I dont take my medication"  . Essential hypertension, benign   . Headache   . History of pneumonia   . Mitral regurgitation    Moderate  . Noncompliance   . Nonischemic cardiomyopathy ([place])    Normal coronaries May 2012, LVEF < 20%  . Pneumonia 2011  . S/P aortic valve repair 05/11/2017   Complex valvuloplasty including plication of left coronary leaflet and 55mm Biostable HAART ring annuloplasty    Tobacco Use: Social History   Tobacco Use  Smoking Status Never Smoker  Smokeless Tobacco Never Used    Labs: Recent Review Flowsheet Data    Labs for ITP Cardiac and Pulmonary Rehab Latest Ref Rng & Units 05/12/2017 05/12/2017 05/12/2017 05/12/2017 05/13/2017   Cholestrol 0 - 200 mg/dL - - - - -   LDLCALC 0 - 99 mg/dL - - - - -   HDL >39 mg/dL - - - - -   Trlycerides <150 mg/dL - - - - -   Hemoglobin A1c 4.8 - 5.6 % - - - - -   PHART 7.350 - 7.450 - 7.405 - - -   PCO2ART 32.0 - 48.0 mmHg - 34.0 - - -   HCO3 20.0 - 28.0 mmol/L - 20.8 - - -   TCO2  22 - 32 mmol/L - - - 22 -   ACIDBASEDEF 0.0 - 2.0 mmol/L - 3.1(H) - - -   O2SAT % 96.1 96.7 74.5 - 91.3      Capillary Blood Glucose: Lab Results  Component Value Date   GLUCAP 134 (H) 05/14/2017   GLUCAP 125 (H) 05/14/2017   GLUCAP 119 (H) 05/13/2017   GLUCAP 121 (H) 05/13/2017   GLUCAP 138 (H) 05/13/2017     Exercise Target Goals:    Exercise Program Goal: Individual exercise prescription set using results from initial 6 min walk test and THRR while considering  patient's activity barriers and safety.   Exercise Prescription Goal: Starting with aerobic activity 30 plus minutes a day, 3 days per week for initial exercise prescription. Provide home exercise prescription and guidelines that participant acknowledges understanding prior to discharge.  Activity Barriers & Risk Stratification: Activity Barriers & Cardiac Risk Stratification - 07/23/17  1331      Activity Barriers & Cardiac Risk Stratification   Activity Barriers  None    Cardiac Risk Stratification  High       6 Minute Walk: 6 Minute Walk    Row Name 07/23/17 1330         6 Minute Walk   Phase  Initial     Distance  1800 feet     Distance % Change  0 %     Distance Feet Change  0 ft     Walk Time  6 minutes     # of Rest Breaks  0     MPH  3.4     METS  3.61     RPE  12     Perceived Dyspnea   12     VO2 Peak  18.84     Symptoms  No     Resting HR  77 bpm     Resting BP  126/74     Resting Oxygen Saturation   97 %     Exercise Oxygen Saturation  during 6 min walk  97 %     Max Ex. HR  93 bpm     Max Ex. BP  142/70     2 Minute Post BP  122/72        Oxygen Initial Assessment:   Oxygen Re-Evaluation:   Oxygen Discharge (Final Oxygen Re-Evaluation):   Initial Exercise Prescription: Initial Exercise Prescription - 07/23/17 1300      Date of Initial Exercise RX and Referring Provider   Date  07/23/17    Referring Provider  Dr. Roxy Manns      Treadmill   MPH  2.2    Grade  0    Minutes  15    METs  2.6      Recumbant Elliptical   Level  1    RPM  45    Watts  48    Minutes  20    METs  2.9      Prescription Details   Frequency (times per week)  3    Duration  Progress to 30 minutes of continuous aerobic without signs/symptoms of physical distress      Intensity   THRR 40-80% of Max Heartrate  [phone]    Ratings of Perceived Exertion  11-13    Perceived Dyspnea  0-4      Progression   Progression  Continue progressive overload as per policy without signs/symptoms or physical distress.      Resistance Training  Training Prescription  Yes    Weight  1    Reps  10-15       Perform Capillary Blood Glucose checks as needed.  Exercise Prescription Changes:  Exercise Prescription Changes    Row Name 07/28/17 1000 08/09/17 1400 08/25/17 1400         Response to Exercise   Blood Pressure (Admit)  -  150/82  160/90      Blood Pressure (Exercise)  -  150/90  170/92     Blood Pressure (Exit)  -  150/70  156/80     Heart Rate (Admit)  -  72 bpm  73 bpm     Heart Rate (Exercise)  -  103 bpm  88 bpm     Heart Rate (Exit)  -  82 bpm  82 bpm     Rating of Perceived Exertion (Exercise)  -  11  10     Duration  -  Progress to 30 minutes of  aerobic without signs/symptoms of physical distress  Progress to 30 minutes of  aerobic without signs/symptoms of physical distress     Intensity  -  THRR New 117-139-162  THRR New 117-140-162       Progression   Progression  -  Continue to progress workloads to maintain intensity without signs/symptoms of physical distress.  Continue to progress workloads to maintain intensity without signs/symptoms of physical distress.       Resistance Training   Training Prescription  Yes  Yes  Yes     Weight  1  2  3      Reps  10-15  10-15  10-15       Treadmill   MPH  2.2  2.4  2.5     Grade  0  0  0     Minutes  15  15  15      METs  2.6  2.8  2.9       Recumbant Elliptical   Level  1  1  2      RPM  45  58  62     Watts  48  78  80     Minutes  20  20  20      METs  2.9  5  4.3       Home Exercise Plan   Plans to continue exercise at  Home (comment)  Home (comment)  Home (comment)     Frequency  Add 2 additional days to program exercise sessions.  Add 2 additional days to program exercise sessions.  Add 2 additional days to program exercise sessions.     Initial Home Exercises Provided  07/28/17  07/28/17  07/28/17        Exercise Comments:  Exercise Comments    Row Name 07/28/17 1049 08/09/17 1426 08/25/17 1405       Exercise Comments  Patient received the take home exercise plan today. THR was addressed as well as safety tips for being active when not in CR. Patient demonstrated an understanding and was encouraged to ask any future questions as they arise.  Patient has not progressed much in CR due to several health concerns. However he has increased his speed on the  treadmill.   Patient is doing well in CR. He has increased his speed on the treadmill as well as his level on the recumbent elliptical. Patient states that he feels some strength returning and feels more endurance.  Exercise Goals and Review:  Exercise Goals    Row Name 07/23/17 1335             Exercise Goals   Increase Physical Activity  Yes       Intervention  Provide advice, education, support and counseling about physical activity/exercise needs.;Develop an individualized exercise prescription for aerobic and resistive training based on initial evaluation findings, risk stratification, comorbidities and participant's personal goals.       Expected Outcomes  Achievement of increased cardiorespiratory fitness and enhanced flexibility, muscular endurance and strength shown through measurements of functional capacity and personal statement of participant.       Increase Strength and Stamina  Yes       Intervention  Develop an individualized exercise prescription for aerobic and resistive training based on initial evaluation findings, risk stratification, comorbidities and participant's personal goals.;Provide advice, education, support and counseling about physical activity/exercise needs.       Expected Outcomes  Achievement of increased cardiorespiratory fitness and enhanced flexibility, muscular endurance and strength shown through measurements of functional capacity and personal statement of participant.       Able to understand and use rate of perceived exertion (RPE) scale  Yes       Intervention  Provide education and explanation on how to use RPE scale       Expected Outcomes  Short Term: Able to use RPE daily in rehab to express subjective intensity level;Long Term:  Able to use RPE to guide intensity level when exercising independently       Able to understand and use Dyspnea scale  Yes       Intervention  Provide education and explanation on how to use Dyspnea scale        Expected Outcomes  Short Term: Able to use Dyspnea scale daily in rehab to express subjective sense of shortness of breath during exertion;Long Term: Able to use Dyspnea scale to guide intensity level when exercising independently       Knowledge and understanding of Target Heart Rate Range (THRR)  Yes       Intervention  Provide education and explanation of THRR including how the numbers were predicted and where they are located for reference       Expected Outcomes  Short Term: Able to state/look up THRR       Able to check pulse independently  Yes       Intervention  Provide education and demonstration on how to check pulse in carotid and radial arteries.;Review the importance of being able to check your own pulse for safety during independent exercise       Expected Outcomes  Short Term: Able to explain why pulse checking is important during independent exercise;Long Term: Able to check pulse independently and accurately       Understanding of Exercise Prescription  Yes       Intervention  Provide education, explanation, and written materials on patient's individual exercise prescription       Expected Outcomes  Short Term: Able to explain program exercise prescription;Long Term: Able to explain home exercise prescription to exercise independently          Exercise Goals Re-Evaluation : Exercise Goals Re-Evaluation    Row Name 08/25/17 1403             Exercise Goal Re-Evaluation   Exercise Goals Review  Increase Physical Activity;Increase Strength and Stamina;Knowledge and understanding of Target Heart Rate Range (THRR)  Comments  Patient is doing well in CR. He has increased his speed on the treadmill as well as his level on the recumbent elliptical. Patient states that he feels some strength returning and feels more endurance.        Expected Outcomes  Patient wishes to get stronger and to get back to work and to lose weight.            Discharge Exercise Prescription (Final  Exercise Prescription Changes): Exercise Prescription Changes - 08/25/17 1400      Response to Exercise   Blood Pressure (Admit)  160/90    Blood Pressure (Exercise)  170/92    Blood Pressure (Exit)  156/80    Heart Rate (Admit)  73 bpm    Heart Rate (Exercise)  88 bpm    Heart Rate (Exit)  82 bpm    Rating of Perceived Exertion (Exercise)  10    Duration  Progress to 30 minutes of  aerobic without signs/symptoms of physical distress    Intensity  THRR New 117-140-162      Progression   Progression  Continue to progress workloads to maintain intensity without signs/symptoms of physical distress.      Resistance Training   Training Prescription  Yes    Weight  3    Reps  10-15      Treadmill   MPH  2.5    Grade  0    Minutes  15    METs  2.9      Recumbant Elliptical   Level  2    RPM  62    Watts  80    Minutes  20    METs  4.3      Home Exercise Plan   Plans to continue exercise at  Home (comment)    Frequency  Add 2 additional days to program exercise sessions.    Initial Home Exercises Provided  07/28/17       Nutrition:  Target Goals: Understanding of nutrition guidelines, daily intake of sodium 1500mg , cholesterol 200mg , calories 30% from fat and 7% or less from saturated fats, daily to have 5 or more servings of fruits and vegetables.  Biometrics: Pre Biometrics - 07/23/17 1336      Pre Biometrics   Height  5\' 10"  (1.778 m)    Waist Circumference  41.5 inches    Hip Circumference  42.5 inches    Waist to Hip Ratio  0.98 %    Triceps Skinfold  7 mm    % Body Fat  25.6 %    Grip Strength  56.8 kg    Flexibility  16 in    Single Leg Stand  60 seconds        Nutrition Therapy Plan and Nutrition Goals: Nutrition Therapy & Goals - 08/23/17 1502      Nutrition Therapy   RD appointment deferred  Yes      Personal Nutrition Goals   Nutrition Goal  Patient is on a regular diet. His Medficts score was 97. He is interested in learning how to eat  healthier.     Additional Goals?  No      Intervention Plan   Expected Outcomes  Short Term Goal: Understand basic principles of dietary content, such as calories, fat, sodium, cholesterol and nutrients.       Nutrition Assessments:   Nutrition Goals Re-Evaluation:   Nutrition Goals Discharge (Final Nutrition Goals Re-Evaluation):   Psychosocial: Target Goals: Acknowledge presence or absence  of significant depression and/or stress, maximize coping skills, provide positive support system. Participant is able to verbalize types and ability to use techniques and skills needed for reducing stress and depression.  Initial Review & Psychosocial Screening: Initial Psych Review & Screening - 07/23/17 1426      Initial Review   Current issues with  None Identified      Family Dynamics   Good Support System?  Yes    Comments  His QOL score was 26.36 and his PHQ-9 score was 7. He says he gets down at times when he looks at his scars but is improving.       Barriers   Psychosocial barriers to participate in program  There are no identifiable barriers or psychosocial needs.       Quality of Life Scores: Quality of Life - 07/23/17 1336      Quality of Life Scores   Health/Function Pre  25.2 %    Socioeconomic Pre  25.71 %    Psych/Spiritual Pre  29.14 %    Family Pre  27 %    GLOBAL Pre  26.36 %      Scores of 19 and below usually indicate a poorer quality of life in these areas.  A difference of  2-3 points is a clinically meaningful difference.  A difference of 2-3 points in the total score of the Quality of Life Index has been associated with significant improvement in overall quality of life, self-image, physical symptoms, and general health in studies assessing change in quality of life.  PHQ-9: Recent Review Flowsheet Data    Depression screen [facility] 2/9 07/23/2017   Decreased Interest 0   Down, Depressed, Hopeless 1   PHQ - 2 Score 1   Altered sleeping 3   Tired, decreased  energy 1   Change in appetite 1   Feeling bad or failure about yourself  1   Trouble concentrating 0   Moving slowly or fidgety/restless 0   Suicidal thoughts 0   PHQ-9 Score 7   Difficult doing work/chores Not difficult at all     Interpretation of Total Score  Total Score Depression Severity:  1-4 = Minimal depression, 5-9 = Mild depression, 10-14 = Moderate depression, 15-19 = Moderately severe depression, 20-27 = Severe depression   Psychosocial Evaluation and Intervention: Psychosocial Evaluation - 07/23/17 1428      Psychosocial Evaluation & Interventions   Interventions  Encouraged to exercise with the program and follow exercise prescription;Stress management education;Relaxation education    Expected Outcomes  Patient will have no psychosocial barriers identified at discharge.     Continue Psychosocial Services   No Follow up required       Psychosocial Re-Evaluation: Psychosocial Re-Evaluation    [place] Name 08/23/17 1507             Psychosocial Re-Evaluation   Current issues with  None Identified       Comments  Patient initial QOL score was 26.36 and his PHQ-9 score was 7. He says when he looks at his scars, he feels down but is not depressed. No barriers identified.        Expected Outcomes  Patient will have no psychosocial barriers identified at discharge.        Interventions  Stress management education;Encouraged to attend Cardiac Rehabilitation for the exercise;Relaxation education       Continue Psychosocial Services   No Follow up required          Psychosocial Discharge (  Final Psychosocial Re-Evaluation): Psychosocial Re-Evaluation - 08/23/17 1507      Psychosocial Re-Evaluation   Current issues with  None Identified    Comments  Patient initial QOL score was 26.36 and his PHQ-9 score was 7. He says when he looks at his scars, he feels down but is not depressed. No barriers identified.     Expected Outcomes  Patient will have no psychosocial barriers  identified at discharge.     Interventions  Stress management education;Encouraged to attend Cardiac Rehabilitation for the exercise;Relaxation education    Continue Psychosocial Services   No Follow up required       Vocational Rehabilitation: Provide vocational rehab assistance to qualifying candidates.   Vocational Rehab Evaluation & Intervention: Vocational Rehab - 07/23/17 1422      Initial Vocational Rehab Evaluation & Intervention   Assessment shows need for Vocational Rehabilitation  No       Education: Education Goals: Education classes will be provided on a weekly basis, covering required topics. Participant will state understanding/return demonstration of topics presented.  Learning Barriers/Preferences: Learning Barriers/Preferences - 07/23/17 1421      Learning Barriers/Preferences   Learning Barriers  None    Learning Preferences  Written Material;Skilled Demonstration;Pictoral       Education Topics: Hypertension, Hypertension Reduction -Define heart disease and high blood pressure. Discus how high blood pressure affects the body and ways to reduce high blood pressure.   Exercise and Your Heart -Discuss why it is important to exercise, the FITT principles of exercise, normal and abnormal responses to exercise, and how to exercise safely.   Angina -Discuss definition of angina, causes of angina, treatment of angina, and how to decrease risk of having angina.   Cardiac Medications -Review what the following cardiac medications are used for, how they affect the body, and side effects that may occur when taking the medications.  Medications include Aspirin, Beta blockers, calcium channel blockers, ACE Inhibitors, angiotensin receptor blockers, diuretics, digoxin, and antihyperlipidemics.   Congestive Heart Failure -Discuss the definition of CHF, how to live with CHF, the signs and symptoms of CHF, and how keep track of weight and sodium intake.   Heart  Disease and Intimacy -Discus the effect sexual activity has on the heart, how changes occur during intimacy as we age, and safety during sexual activity.   Smoking Cessation / COPD -Discuss different methods to quit smoking, the health benefits of quitting smoking, and the definition of COPD.   Nutrition I: Fats -Discuss the types of cholesterol, what cholesterol does to the heart, and how cholesterol levels can be controlled.   Nutrition II: Labels -Discuss the different components of food labels and how to read food label   Heart Parts/Heart Disease and PAD -Discuss the anatomy of the heart, the pathway of blood circulation through the heart, and these are affected by heart disease.   Stress I: Signs and Symptoms -Discuss the causes of stress, how stress may lead to anxiety and depression, and ways to limit stress.   CARDIAC REHAB PHASE II EXERCISE from 08/11/2017 in [place]  Date  07/28/17  Educator  DJ  Instruction Review Code  2- Demonstrated Understanding      Stress II: Relaxation -Discuss different types of relaxation techniques to limit stress.   CARDIAC REHAB PHASE II EXERCISE from 08/11/2017 in [place]  Date  08/04/17  Educator  DC  Instruction Review Code  2- Demonstrated Understanding      Warning Signs  of Stroke / TIA -Discuss definition of a stroke, what the signs and symptoms are of a stroke, and how to identify when someone is having stroke.   CARDIAC REHAB PHASE II EXERCISE from 08/11/2017 in [place]  Date  08/11/17  Educator  DC  Instruction Review Code  2- Demonstrated Understanding      Knowledge Questionnaire Score: Knowledge Questionnaire Score - 07/23/17 1421      Knowledge Questionnaire Score   Pre Score  18/24       Core Components/Risk Factors/Patient Goals at Admission: Personal Goals and Risk Factors at Admission - 07/23/17 1424      Core Components/Risk  Factors/Patient Goals on Admission    Weight Management  Yes    Admit Weight  225 lb 1.6 oz (102.1 kg)    Goal Weight: Short Term  215 lb (97.5 kg)    Goal Weight: Long Term  205 lb (93 kg)    Expected Outcomes  Long Term: Adherence to nutrition and physical activity/exercise program aimed toward attainment of established weight goal;Weight Loss: Understanding of general recommendations for a balanced deficit meal plan, which promotes 1-2 lb weight loss per week and includes a negative energy balance of [phone] kcal/d;Weight Gain: Understanding of general recommendations for a high calorie, high protein meal plan that promotes weight gain by distributing calorie intake throughout the day with the consumption for 4-5 meals, snacks, and/or supplements    Hypertension  Yes    Intervention  Provide education on lifestyle modifcations including regular physical activity/exercise, weight management, moderate sodium restriction and increased consumption of fresh fruit, vegetables, and low fat dairy, alcohol moderation, and smoking cessation.;Monitor prescription use compliance.    Expected Outcomes  Short Term: Continued assessment and intervention until BP is < 140/93mm HG in hypertensive participants. < 130/58mm HG in hypertensive participants with diabetes, heart failure or chronic kidney disease.;Long Term: Maintenance of blood pressure at goal levels.    Personal Goal Other  Yes    Personal Goal  Get stronger; Get back to work; Lose weight-get under 200 lbs.     Intervention  Patient will attend CR 3 days/week and supplement with exercise 2 days/week.     Expected Outcomes  Patient will meet his personal goals.        Core Components/Risk Factors/Patient Goals Review:  Goals and Risk Factor Review    Row Name 08/23/17 1503             Core Components/Risk Factors/Patient Goals Review   Personal Goals Review  Weight Management/Obesity;Hypertension;Other Get stronger; get back to work; lose  down to 200 lbs.        Review  Patient has completed 11 sessions losing 1lb. He is doing well in the program with progression. He says he feels stronger and is able to more with his neices and nephews without getting tired. He has to complete the program before he can return to work. His cardiologist has not released him to return yet. His blood pressues tend to fluctuate with his initial readings still hypertensive. Will continue to monitor for progress.        Expected Outcomes  Patient will continue to attend sessions and complete the program and meet his personal goals.           Core Components/Risk Factors/Patient Goals at Discharge (Final Review):  Goals and Risk Factor Review - 08/23/17 1503      Core Components/Risk Factors/Patient Goals Review   Personal Goals Review  Weight Management/Obesity;Hypertension;Other Get stronger; get back to work; lose down to 200 lbs.     Review  Patient has completed 11 sessions losing 1lb. He is doing well in the program with progression. He says he feels stronger and is able to more with his neices and nephews without getting tired. He has to complete the program before he can return to work. His cardiologist has not released him to return yet. His blood pressues tend to fluctuate with his initial readings still hypertensive. Will continue to monitor for progress.     Expected Outcomes  Patient will continue to attend sessions and complete the program and meet his personal goals.        ITP Comments: ITP Comments    Row Name 07/26/17 1058           ITP Comments  Patient new to program completing 2 sessions. Will monitor for progress.           Comments: ITP 30 Day REVIEW Patient doing well in the program. Will continue to monitor for progress.

## 2017-08-23 NOTE — Progress Notes (Signed)
Daily Session Note  Patient Details  Name: [name] [name] MRN: [id number] Date of Birth: [birth year] Referring Provider:     CARDIAC REHAB PHASE II ORIENTATION from 07/23/2017 in [place]  Referring Provider  Dr. Roxy Manns      Encounter Date: 08/23/2017  Check In: Session Check In - 08/23/17 0947      Check-In   Location  [facility]    Staff Present  Suzanne Boron, BS, EP, Exercise Physiologist;Debra Wynetta Emery, RN, BSN    Supervising physician immediately available to respond to emergencies  See telemetry face sheet for immediately available MD    Medication changes reported      No    Fall or balance concerns reported     No    Warm-up and Cool-down  Performed as group-led instruction    Resistance Training Performed  Yes    VAD Patient?  No      Pain Assessment   Currently in Pain?  No/denies    Pain Score  0-No pain    Multiple Pain Sites  No       Capillary Blood Glucose: No results found for this or any previous visit (from the past 24 hour(s)).    Social History   Tobacco Use  Smoking Status Never Smoker  Smokeless Tobacco Never Used    Goals Met:  Independence with exercise equipment Exercise tolerated well No report of cardiac concerns or symptoms Strength training completed today  Goals Unmet:  Not Applicable  Comments: Check out 1030   Dr. Kate Sable is Medical Director for [place] and Pulmonary Re[facility].

## 2017-08-25 ENCOUNTER — Encounter (HOSPITAL_COMMUNITY)
Admission: RE | Admit: 2017-08-25 | Discharge: 2017-08-25 | Disposition: A | Discharge status: Home / Self Care | Payer: BLUE CROSS/BLUE SHIELD | Source: Ambulatory Visit | Attending: Thoracic Surgery (Cardiothoracic Vascular Surgery) | Admitting: Thoracic Surgery (Cardiothoracic Vascular Surgery)

## 2017-08-25 DIAGNOSIS — I5042 Chronic combined systolic (congestive) and diastolic (congestive) heart failure: Secondary | ICD-10-CM | POA: Diagnosis not present

## 2017-08-25 DIAGNOSIS — Z9889 Other specified postprocedural states: Secondary | ICD-10-CM | POA: Diagnosis not present

## 2017-08-25 DIAGNOSIS — F419 Anxiety disorder, unspecified: Secondary | ICD-10-CM | POA: Diagnosis not present

## 2017-08-25 DIAGNOSIS — I429 Cardiomyopathy, unspecified: Secondary | ICD-10-CM | POA: Diagnosis not present

## 2017-08-25 DIAGNOSIS — I130 Hypertensive heart and chronic kidney disease with heart failure and stage 1 through stage 4 chronic kidney disease, or unspecified chronic kidney disease: Secondary | ICD-10-CM | POA: Diagnosis not present

## 2017-08-25 DIAGNOSIS — N182 Chronic kidney disease, stage 2 (mild): Secondary | ICD-10-CM | POA: Diagnosis not present

## 2017-08-25 DIAGNOSIS — I351 Nonrheumatic aortic (valve) insufficiency: Secondary | ICD-10-CM | POA: Diagnosis not present

## 2017-08-25 NOTE — Progress Notes (Signed)
Daily Session Note  Patient Details  Name: [name] [name] MRN: [id number] Date of Birth: 1980-08-07 Referring Provider:     CARDIAC REHAB PHASE II ORIENTATION from 07/23/2017 in [place]  Referring Provider  Dr. Roxy Manns      Encounter Date: 08/25/2017  Check In: Session Check In - 08/25/17 0931      Check-In   Location  [facility]    Staff Present  Russella Dar, MS, EP, [facility], Exercise Physiologist;Debra Wynetta Emery, RN, BSN;Donja Tipping, BS, EP, Exercise Physiologist    Supervising physician immediately available to respond to emergencies  See telemetry face sheet for immediately available MD    Medication changes reported      No    Fall or balance concerns reported     No    Warm-up and Cool-down  Performed as group-led instruction    Resistance Training Performed  Yes    VAD Patient?  No      Pain Assessment   Currently in Pain?  No/denies    Pain Score  0-No pain    Multiple Pain Sites  No       Capillary Blood Glucose: No results found for this or any previous visit (from the past 24 hour(s)).    Social History   Tobacco Use  Smoking Status Never Smoker  Smokeless Tobacco Never Used    Goals Met:  Independence with exercise equipment Exercise tolerated well No report of cardiac concerns or symptoms Strength training completed today  Goals Unmet:  Not Applicable  Comments: Check out 1030   Dr. Kate Sable is Medical Director for [name] and Pulmonary Re[facility].

## 2017-08-27 ENCOUNTER — Encounter (HOSPITAL_COMMUNITY)
Admission: RE | Admit: 2017-08-27 | Discharge: 2017-08-27 | Disposition: A | Discharge status: Home / Self Care | Payer: BLUE CROSS/BLUE SHIELD | Source: Ambulatory Visit | Attending: Thoracic Surgery (Cardiothoracic Vascular Surgery) | Admitting: Thoracic Surgery (Cardiothoracic Vascular Surgery)

## 2017-08-27 DIAGNOSIS — I351 Nonrheumatic aortic (valve) insufficiency: Secondary | ICD-10-CM | POA: Diagnosis not present

## 2017-08-27 DIAGNOSIS — F419 Anxiety disorder, unspecified: Secondary | ICD-10-CM | POA: Diagnosis not present

## 2017-08-27 DIAGNOSIS — I130 Hypertensive heart and chronic kidney disease with heart failure and stage 1 through stage 4 chronic kidney disease, or unspecified chronic kidney disease: Secondary | ICD-10-CM | POA: Diagnosis not present

## 2017-08-27 DIAGNOSIS — I5042 Chronic combined systolic (congestive) and diastolic (congestive) heart failure: Secondary | ICD-10-CM | POA: Diagnosis not present

## 2017-08-27 DIAGNOSIS — Z9889 Other specified postprocedural states: Secondary | ICD-10-CM

## 2017-08-27 DIAGNOSIS — I429 Cardiomyopathy, unspecified: Secondary | ICD-10-CM | POA: Diagnosis not present

## 2017-08-27 DIAGNOSIS — N182 Chronic kidney disease, stage 2 (mild): Secondary | ICD-10-CM | POA: Diagnosis not present

## 2017-08-27 NOTE — Progress Notes (Signed)
Daily Session Note  Patient Details  Name: [name] [name] MRN: [id number] Date of Birth: [birth year] Referring Provider:     CARDIAC REHAB PHASE II ORIENTATION from 07/23/2017 in [place]  Referring Provider  Dr. Roxy Manns      Encounter Date: 08/27/2017  Check In: Session Check In - 08/27/17 1003      Check-In   Location  [facility]    Staff Present  Diane Angelina Pih, MS, EP, [facility], Exercise Physiologist;Debra Wynetta Emery, RN, BSN;Rahmon Heigl, BS, EP, Exercise Physiologist    Supervising physician immediately available to respond to emergencies  See telemetry face sheet for immediately available MD    Medication changes reported      No    Fall or balance concerns reported     No    Warm-up and Cool-down  Performed as group-led instruction    Resistance Training Performed  Yes    VAD Patient?  No      Pain Assessment   Currently in Pain?  No/denies    Pain Score  0-No pain    Multiple Pain Sites  No       Capillary Blood Glucose: No results found for this or any previous visit (from the past 24 hour(s)).    Social History   Tobacco Use  Smoking Status Never Smoker  Smokeless Tobacco Never Used    Goals Met:  Independence with exercise equipment Exercise tolerated well No report of cardiac concerns or symptoms Strength training completed today  Goals Unmet:  Not Applicable  Comments: Check out 1030   Dr. Kate Sable is Medical Director for [place] and Pulmonary Rehab.

## 2017-08-30 ENCOUNTER — Encounter (HOSPITAL_COMMUNITY)
Admission: RE | Admit: 2017-08-30 | Discharge: 2017-08-30 | Disposition: A | Discharge status: Home / Self Care | Payer: BLUE CROSS/BLUE SHIELD | Source: Ambulatory Visit | Attending: Thoracic Surgery (Cardiothoracic Vascular Surgery) | Admitting: Thoracic Surgery (Cardiothoracic Vascular Surgery)

## 2017-08-30 DIAGNOSIS — I351 Nonrheumatic aortic (valve) insufficiency: Secondary | ICD-10-CM | POA: Diagnosis not present

## 2017-08-30 DIAGNOSIS — N182 Chronic kidney disease, stage 2 (mild): Secondary | ICD-10-CM | POA: Diagnosis not present

## 2017-08-30 DIAGNOSIS — I130 Hypertensive heart and chronic kidney disease with heart failure and stage 1 through stage 4 chronic kidney disease, or unspecified chronic kidney disease: Secondary | ICD-10-CM | POA: Diagnosis not present

## 2017-08-30 DIAGNOSIS — I5042 Chronic combined systolic (congestive) and diastolic (congestive) heart failure: Secondary | ICD-10-CM | POA: Diagnosis not present

## 2017-08-30 DIAGNOSIS — Z9889 Other specified postprocedural states: Secondary | ICD-10-CM | POA: Diagnosis not present

## 2017-08-30 DIAGNOSIS — I429 Cardiomyopathy, unspecified: Secondary | ICD-10-CM | POA: Diagnosis not present

## 2017-08-30 DIAGNOSIS — F419 Anxiety disorder, unspecified: Secondary | ICD-10-CM | POA: Diagnosis not present

## 2017-08-30 NOTE — Progress Notes (Signed)
Daily Session Note  Patient Details  Name: [name] [name] MRN: [id number] Date of Birth: Jun 16, 1981 Referring Provider:     CARDIAC REHAB PHASE II ORIENTATION from 07/23/2017 in [place]  Referring Provider  Dr. Roxy Manns      Encounter Date: 08/30/2017  Check In: Session Check In - 08/30/17 0930      Check-In   Location  [facility]    Staff Present  Diane Angelina Pih, MS, EP, [facility], Exercise Physiologist;Liem Copenhaver Wynetta Emery, RN, BSN;Gregory Cowan, BS, EP, Exercise Physiologist    Supervising physician immediately available to respond to emergencies  See telemetry face sheet for immediately available MD    Medication changes reported      No    Fall or balance concerns reported     No    Warm-up and Cool-down  Performed as group-led instruction    Resistance Training Performed  Yes    VAD Patient?  No      Pain Assessment   Currently in Pain?  No/denies    Pain Score  0-No pain    Multiple Pain Sites  No       Capillary Blood Glucose: No results found for this or any previous visit (from the past 24 hour(s)).    Social History   Tobacco Use  Smoking Status Never Smoker  Smokeless Tobacco Never Used    Goals Met:  Independence with exercise equipment Exercise tolerated well No report of cardiac concerns or symptoms Strength training completed today  Goals Unmet:  Not Applicable  Comments: Check out 1030.   Dr. Kate Sable is Medical Director for [facility] and Pulmonary Rehab.

## 2017-09-01 ENCOUNTER — Encounter (HOSPITAL_COMMUNITY)
Admission: RE | Admit: 2017-09-01 | Discharge: 2017-09-01 | Disposition: A | Discharge status: Home / Self Care | Payer: BLUE CROSS/BLUE SHIELD | Source: Ambulatory Visit | Attending: Thoracic Surgery (Cardiothoracic Vascular Surgery) | Admitting: Thoracic Surgery (Cardiothoracic Vascular Surgery)

## 2017-09-01 ENCOUNTER — Ambulatory Visit (INDEPENDENT_AMBULATORY_CARE_PROVIDER_SITE_OTHER): Payer: BLUE CROSS/BLUE SHIELD | Admitting: Thoracic Surgery (Cardiothoracic Vascular Surgery)

## 2017-09-01 ENCOUNTER — Encounter: Payer: Self-pay | Admitting: Thoracic Surgery (Cardiothoracic Vascular Surgery)

## 2017-09-01 VITALS — BP 150/90 | HR 73 | Resp 20 | Ht 70.0 in | Wt 222.0 lb

## 2017-09-01 DIAGNOSIS — I351 Nonrheumatic aortic (valve) insufficiency: Secondary | ICD-10-CM

## 2017-09-01 DIAGNOSIS — I130 Hypertensive heart and chronic kidney disease with heart failure and stage 1 through stage 4 chronic kidney disease, or unspecified chronic kidney disease: Secondary | ICD-10-CM | POA: Diagnosis not present

## 2017-09-01 DIAGNOSIS — I5042 Chronic combined systolic (congestive) and diastolic (congestive) heart failure: Secondary | ICD-10-CM | POA: Diagnosis not present

## 2017-09-01 DIAGNOSIS — Z9889 Other specified postprocedural states: Secondary | ICD-10-CM

## 2017-09-01 DIAGNOSIS — F419 Anxiety disorder, unspecified: Secondary | ICD-10-CM | POA: Diagnosis not present

## 2017-09-01 DIAGNOSIS — N182 Chronic kidney disease, stage 2 (mild): Secondary | ICD-10-CM | POA: Diagnosis not present

## 2017-09-01 DIAGNOSIS — I429 Cardiomyopathy, unspecified: Secondary | ICD-10-CM | POA: Diagnosis not present

## 2017-09-01 NOTE — Patient Instructions (Signed)

## 2017-09-01 NOTE — Progress Notes (Signed)
Daily Session Note  Patient Details  Name: ADORIAN GWYNNE MRN: [id number] Date of Birth: [birth year] Referring Provider:     CARDIAC REHAB PHASE II ORIENTATION from 07/23/2017 in [place]  Referring Provider  Dr. Roxy Manns      Encounter Date: 09/01/2017  Check In: Session Check In - 09/01/17 0941      Check-In   Location  [facility]    Staff Present  Russella Dar, MS, EP, [facility], Exercise Physiologist;Debra Wynetta Emery, RN, BSN;Ivette Castronova, BS, EP, Exercise Physiologist    Supervising physician immediately available to respond to emergencies  See telemetry face sheet for immediately available MD    Medication changes reported      No    Fall or balance concerns reported     No    Warm-up and Cool-down  Performed as group-led instruction    Resistance Training Performed  Yes      Pain Assessment   Currently in Pain?  No/denies    Pain Score  0-No pain    Multiple Pain Sites  No       Capillary Blood Glucose: No results found for this or any previous visit (from the past 24 hour(s)).    Social History   Tobacco Use  Smoking Status Never Smoker  Smokeless Tobacco Never Used    Goals Met:  Independence with exercise equipment Exercise tolerated well No report of cardiac concerns or symptoms Strength training completed today  Goals Unmet:  Not Applicable  Comments: Check out 1030   Dr. Kate Sable is Medical Director for [place] and Pulmonary Rehab.

## 2017-09-01 NOTE — Progress Notes (Addendum)
[name] [name]       [place] [address]             [phone]     CARDIOTHORACIC SURGERY OFFICE NOTE  Referring Provider is [name] [name], Shaune Pascal, MD PCP is Sharilyn Sites, MD   HPI:  Patient is a 37 year old African-American male with history of aortic insufficiency, nonischemic cardiomyopathy with chronic combined systolic and diastolic congestive heart failure, long-standing hypertension with hypertensive heart disease, and chronic kidney disease who returns the office today for routine follow-up status post aortic valve repair on May 11, 2017.  He was last seen here in our office on June 14, 2017 at which time he was doing well.  Since then he underwent routine follow-up echocardiogram and was seen in follow-up by Dr. Haroldine Laws on June 28, 2017.  Echocardiogram revealed some improvement in left ventricular systolic function with ejection fraction estimated 45-50%.  There was reportedly "moderate" residual aortic insufficiency with pressure halftime reported 932 ms.  Mean transvalvular gradient across aortic valve was estimated 14 mmHg.  The patient enrolled in the cardiac rehab program and has been making good progress.  He returns to our office today for routine follow-up.  He states that his sternotomy incision remains sensitive but otherwise he is doing well.  He states that his exercise tolerance has been improving and at this point he feels better than he did prior to surgery.  He has no other complaints.   Current Outpatient Medications  Medication Sig Dispense Refill  . acetaminophen (TYLENOL) 500 MG tablet Take 1,000 mg by mouth every 6 (six) hours as needed for moderate pain or headache.     . carvedilol (COREG) 25 MG tablet Take 1 tablet (25 mg total) by mouth 2 (two) times daily with a meal. 60 tablet 6  . ENTRESTO 97-103 MG TAKE 1 TABLET BY MOUTH TWICE DAILY 60 tablet 3  . furosemide (LASIX) 40 MG tablet Take 1 tablet (40 mg total) 2 (two) times  daily by mouth. (Patient taking differently: Take 40 mg by mouth daily. ) 60 tablet 1  . meclizine (ANTIVERT) 25 MG tablet Take 1 tablet (25 mg total) by mouth 3 (three) times daily as needed for dizziness. 30 tablet 0  . hydrALAZINE (APRESOLINE) 25 MG tablet Take 1 tablet (25 mg total) by mouth 3 (three) times daily. 90 tablet 3   No current facility-administered medications for this visit.       Physical Exam:   BP (!) 150/90   Pulse 73   Resp 20   Ht 5\' 10"  (1.778 m)   Wt 222 lb (100.7 kg)   SpO2 98% Comment: RA  BMI 31.85 kg/m   General:  Well-appearing  Chest:   Clear to auscultation  CV:   Regular rate and rhythm with grade 3/6 systolic and diastolic murmur heard best at the left lower sternal border  Incisions:  Well-healed, sternum is stable  Abdomen:  Soft nontender  Extremities:  Warm and well perfused  Diagnostic Tests:  Transthoracic Echocardiography  Patient:    Zenith, [name] [name] MR #:       [id number] Study Date: 06/23/2017 Gender:     M Age:        36 Height:     177.8 cm Weight:     103.9 kg BSA:        2.3 m^2 Pt. Status: Room:   ATTENDING    Pierre Bali, MD  [facility]  [name] [name], [name] [name]  REFERRING    [name] [name], [name] [name]  PERFORMING   [facility], Forestine Na  SONOGRAPHER  Alvino Chapel, RCS  cc:  ------------------------------------------------------------------- LV EF: 45% -   50%  ------------------------------------------------------------------- Indications:      CHF - 428.0.  ------------------------------------------------------------------- History:   PMH:  Acquired from the patient and from the patient&'s chart.  PMH:  Nonischemic cardiomyopathy, Obstructive sleep apnea, S/P AV repair 04/2017. Chronic combined systolic and diastolic heart failure  Risk factors:  Hypertension.  ------------------------------------------------------------------- Study Conclusions  - Left ventricle: The cavity size was normal. Systolic function  was   mildly reduced. The estimated ejection fraction was in the range   of 45% to 50%. Diffuse hypokinesis. Features are consistent with   a pseudonormal left ventricular filling pattern, with concomitant   abnormal relaxation and increased filling pressure (grade 2   diastolic dysfunction). Moderate to severe concentric left   ventricular hypertrophy. - Aortic valve: S/p aortic valve repair (Biostable HAART aortic   ring annuloplasty). There was moderate regurgitation, best seen   on apical 3 and 4 chamber views. Valve area (VTI): 1.24 cm^2.   Valve area (Vmax): 1.22 cm^2. Valve area (Vmean): 1.33 cm^2. - Aorta: Mild to moderate aortic root dilatation. - Right ventricle: Systolic function was moderately reduced.  ------------------------------------------------------------------- Study data:  Comparison was made to the study of 04/01/2017.  Study status:  Routine.  Procedure:  The patient reported no pain pre or post test. Transthoracic echocardiography. Image quality was adequate.  Study completion:  There were no complications. Transthoracic echocardiography.  M-mode, complete 2D, spectral Doppler, and color Doppler.  Birthdate:  Patient birthdate: 1981-03-02.  Age:  Patient is 37 yr old.  Sex:  Gender: male. BMI: 32.9 kg/m^2.  Blood pressure:     120/74  Patient status: Inpatient.  Study date:  Study date: 06/23/2017. Study time: 01:17 PM.  Location:  Echo laboratory.  -------------------------------------------------------------------  ------------------------------------------------------------------- Left ventricle:  The cavity size was normal. Systolic function was mildly reduced. The estimated ejection fraction was in the range of 45% to 50%. Diffuse hypokinesis. Moderate to severe concentric left ventricular hypertrophy. Features are consistent with a pseudonormal left ventricular filling pattern, with concomitant abnormal relaxation and increased filling pressure  (grade 2 diastolic dysfunction). There was no evidence of elevated ventricular filling pressure by Doppler parameters.  ------------------------------------------------------------------- Aortic valve:  S/p aortic valve repair (Biostable HAART aortic ring annuloplasty).  Doppler:   There was no stenosis.   There was moderate regurgitation, best seen on apical 3 and 4 chamber views.   VTI ratio of LVOT to aortic valve: 0.36. Valve area (VTI): 1.24 cm^2. Indexed valve area (VTI): 0.54 cm^2/m^2. Peak velocity ratio of LVOT to aortic valve: 0.35. Valve area (Vmax): 1.22 cm^2. Indexed valve area (Vmax): 0.53 cm^2/m^2. Mean velocity ratio of LVOT to aortic valve: 0.38. Valve area (Vmean): 1.33 cm^2. Indexed valve area (Vmean): 0.58 cm^2/m^2.    Mean gradient (S): 14 mm Hg. Peak gradient (S): 26 mm Hg.  ------------------------------------------------------------------- Aorta:  Mild to moderate aortic root dilatation.  ------------------------------------------------------------------- Mitral valve:   Normal thickness leaflets .  ------------------------------------------------------------------- Left atrium:  The atrium was normal in size.  ------------------------------------------------------------------- Atrial septum:  No defect or patent foramen ovale was identified.   ------------------------------------------------------------------- Right ventricle:  The cavity size was normal. Systolic function was moderately reduced.  ------------------------------------------------------------------- Pulmonic valve:    The valve appears to be grossly normal. Doppler:  There was no significant regurgitation.  ------------------------------------------------------------------- Tricuspid valve:   Structurally normal  valve.   Leaflet separation was normal.  Doppler:  Transvalvular velocity was within the normal range. There was trivial  regurgitation.  ------------------------------------------------------------------- Right atrium:  The atrium was normal in size.  ------------------------------------------------------------------- Pericardium:  There was no pericardial effusion.  ------------------------------------------------------------------- Systemic veins: Inferior vena cava: The vessel was normal in size. The respirophasic diameter changes were in the normal range (>= 50%), consistent with normal central venous pressure.  ------------------------------------------------------------------- Post procedure conclusions Ascending Aorta:  - Mild to moderate aortic root dilatation.  ------------------------------------------------------------------- Measurements   Left ventricle                           Value          Reference  LV ID, ED, PLAX chordal          (H)     55.3  mm       43 - 52  LV ID, ES, PLAX chordal          (H)     39.8  mm       23 - 38  LV fx shortening, PLAX chordal   (L)     28    %        >=29  LV PW thickness, ED                      16.6  mm       ----------  IVS/LV PW ratio, ED                      0.92           <=1.3  Stroke volume, 2D                        56    ml       ----------  Stroke volume/bsa, 2D                    24    ml/m^2   ----------  LV ejection fraction, 1-p A4C            60    %        ----------  LV end-diastolic volume, 2-p             134   ml       ----------  LV end-systolic volume, 2-p              64    ml       ----------  LV ejection fraction, 2-p                53    %        ----------  Stroke volume, 2-p                       71    ml       ----------  LV end-diastolic volume/bsa, 2-p         58    ml/m^2   ----------  LV end-systolic volume/bsa, 2-p          28    ml/m^2   ----------  Stroke volume/bsa, 2-p                   30.7  ml/m^2   ----------  LV e&', lateral  7.29  cm/s     ----------  LV E/e&', lateral                          7.6            ----------  LV e&', medial                            5.77  cm/s     ----------  LV E/e&', medial                          9.6            ----------  LV e&', average                           6.53  cm/s     ----------  LV E/e&', average                         8.48           ----------    Ventricular septum                       Value          Reference  IVS thickness, ED                        15.2  mm       ----------    LVOT                                     Value          Reference  LVOT ID, S                               21    mm       ----------  LVOT area                                3.46  cm^2     ----------  LVOT peak velocity, S                    89.1  cm/s     ----------  LVOT mean velocity, S                    63.9  cm/s     ----------  LVOT VTI, S                              16.3  cm       ----------  LVOT peak gradient, S                    3     mm Hg    ----------    Aortic valve                             Value  Reference  Aortic valve peak velocity, S            253   cm/s     ----------  Aortic valve mean velocity, S            166   cm/s     ----------  Aortic valve VTI, S                      45.4  cm       ----------  Aortic mean gradient, S                  14    mm Hg    ----------  Aortic peak gradient, S                  26    mm Hg    ----------  VTI ratio, LVOT/AV                       0.36           ----------  Aortic valve area, VTI                   1.24  cm^2     ----------  Aortic valve area/bsa, VTI               0.54  cm^2/m^2 ----------  Velocity ratio, peak, LVOT/AV            0.35           ----------  Aortic valve area, peak velocity         1.22  cm^2     ----------  Aortic valve area/bsa, peak              0.53  cm^2/m^2 ----------  velocity  Velocity ratio, mean, LVOT/AV            0.38           ----------  Aortic valve area, mean velocity         1.33  cm^2     ----------  Aortic valve  area/bsa, mean              0.58  cm^2/m^2 ----------  velocity  Aortic regurg pressure half-time         932   ms       ----------    Aorta                                    Value          Reference  Aortic root ID, ED                       45    mm       ----------    Left atrium                              Value          Reference  LA ID, A-P, ES                           43    mm       ----------  LA ID/bsa, A-P  1.87  cm/m^2   <=2.2  LA volume, S                             69.2  ml       ----------  LA volume/bsa, S                         30.1  ml/m^2   ----------  LA volume, ES, 1-p A4C                   71.4  ml       ----------  LA volume/bsa, ES, 1-p A4C               31.1  ml/m^2   ----------  LA volume, ES, 1-p A2C                   64.8  ml       ----------  LA volume/bsa, ES, 1-p A2C               28.2  ml/m^2   ----------    Mitral valve                             Value          Reference  Mitral E-wave peak velocity              55.4  cm/s     ----------  Mitral A-wave peak velocity              40.1  cm/s     ----------  Mitral deceleration time                 211   ms       150 - 230  Mitral E/A ratio, peak                   1.4            ----------    Pulmonary arteries                       Value          Reference  PA pressure, S, DP                       13    mm Hg    <=30    Tricuspid valve                          Value          Reference  Tricuspid regurg peak velocity           157   cm/s     ----------  Tricuspid peak RV-RA gradient            10    mm Hg    ----------    Right atrium                             Value          Reference  RA ID, S-I, ES, A4C              (H)  52.5  mm       34 - 49  RA area, ES, A4C                         14.7  cm^2     8.3 - 19.5  RA volume, ES, A/L                       33.9  ml       ----------  RA volume/bsa, ES, A/L                   14.8  ml/m^2   ----------    Systemic veins                            Value          Reference  Estimated CVP                            3     mm Hg    ----------    Right ventricle                          Value          Reference  RV ID, ED, PLAX                          28.6  mm       19 - 38  TAPSE                                    10.2  mm       ----------  RV pressure, S, DP                       13    mm Hg    <=30  RV s&', lateral, S                        6.31  cm/s     ----------  Legend: (L)  and  (H)  mark values outside specified reference range.  ------------------------------------------------------------------- Prepared and Electronically Authenticated by  [name] Sable, MD 2018-12-12T16:33:48   Impression:  Patient is doing well more than 4 months status post aortic valve repair.  Follow-up echocardiogram performed in December revealed improved left ventricular systolic function but moderate residual aortic insufficiency.  Clinically the patient is doing well.  Plan:  I have encouraged the patient to continue to gradually increase his physical activity without any particular limitations at this time.  I think he may return to work.  We have not recommended any changes to his current medications.  The patient has been reminded regarding the importance of dental hygiene and the lifelong need for antibiotic prophylaxis for all dental cleanings and other related invasive procedures.  The patient will return to our office next fall for routine follow-up approximately 1 year postoperatively.  I think it would make sense to repeat a follow-up echocardiogram at that time if one has not yet already been performed.   I spent in excess of 15 minutes during the conduct of this office consultation and >50% of  this time involved direct face-to-face encounter with the patient for counseling and/or coordination of their care.    Valentina Gu. Roxy Manns, MD 09/01/2017 4:05 PM

## 2017-09-03 ENCOUNTER — Encounter (HOSPITAL_COMMUNITY): Payer: BLUE CROSS/BLUE SHIELD

## 2017-09-06 ENCOUNTER — Encounter (HOSPITAL_COMMUNITY)
Admission: RE | Admit: 2017-09-06 | Discharge: 2017-09-06 | Disposition: A | Discharge status: Home / Self Care | Payer: BLUE CROSS/BLUE SHIELD | Source: Ambulatory Visit | Attending: Thoracic Surgery (Cardiothoracic Vascular Surgery) | Admitting: Thoracic Surgery (Cardiothoracic Vascular Surgery)

## 2017-09-06 DIAGNOSIS — F419 Anxiety disorder, unspecified: Secondary | ICD-10-CM | POA: Diagnosis not present

## 2017-09-06 DIAGNOSIS — Z9889 Other specified postprocedural states: Secondary | ICD-10-CM

## 2017-09-06 DIAGNOSIS — I130 Hypertensive heart and chronic kidney disease with heart failure and stage 1 through stage 4 chronic kidney disease, or unspecified chronic kidney disease: Secondary | ICD-10-CM | POA: Diagnosis not present

## 2017-09-06 DIAGNOSIS — I351 Nonrheumatic aortic (valve) insufficiency: Secondary | ICD-10-CM | POA: Diagnosis not present

## 2017-09-06 DIAGNOSIS — I429 Cardiomyopathy, unspecified: Secondary | ICD-10-CM | POA: Diagnosis not present

## 2017-09-06 DIAGNOSIS — N182 Chronic kidney disease, stage 2 (mild): Secondary | ICD-10-CM | POA: Diagnosis not present

## 2017-09-06 DIAGNOSIS — I5042 Chronic combined systolic (congestive) and diastolic (congestive) heart failure: Secondary | ICD-10-CM | POA: Diagnosis not present

## 2017-09-06 NOTE — Progress Notes (Signed)
Daily Session Note  Patient Details  Name: [name] [name] MRN: [id number] Date of Birth: 04-Mar-1981 Referring Provider:     CARDIAC REHAB PHASE II ORIENTATION from 07/23/2017 in [place]  Referring Provider  Dr. Roxy Manns      Encounter Date: 09/06/2017  Check In: Session Check In - 09/06/17 1005      Check-In   Location  [facility]    Staff Present  Diane Angelina Pih, MS, EP, [facility], Exercise Physiologist;Debra Wynetta Emery, RN, BSN;Cherl Gorney, BS, EP, Exercise Physiologist    Supervising physician immediately available to respond to emergencies  See telemetry face sheet for immediately available MD    Medication changes reported      No    Fall or balance concerns reported     No    Warm-up and Cool-down  Performed as group-led instruction    Resistance Training Performed  Yes    VAD Patient?  No      Pain Assessment   Currently in Pain?  No/denies    Pain Score  0-No pain    Multiple Pain Sites  No       Capillary Blood Glucose: No results found for this or any previous visit (from the past 24 hour(s)).    Social History   Tobacco Use  Smoking Status Never Smoker  Smokeless Tobacco Never Used    Goals Met:  Independence with exercise equipment Exercise tolerated well No report of cardiac concerns or symptoms Strength training completed today  Goals Unmet:  Not Applicable  Comments: Check out 1030   Dr. Kate Sable is Medical Director for [place] and Pulmonary Rehab.

## 2017-09-08 ENCOUNTER — Encounter (HOSPITAL_COMMUNITY)
Admission: RE | Admit: 2017-09-08 | Discharge: 2017-09-08 | Disposition: A | Discharge status: Home / Self Care | Payer: BLUE CROSS/BLUE SHIELD | Source: Ambulatory Visit | Attending: Thoracic Surgery (Cardiothoracic Vascular Surgery) | Admitting: Thoracic Surgery (Cardiothoracic Vascular Surgery)

## 2017-09-08 DIAGNOSIS — Z9889 Other specified postprocedural states: Secondary | ICD-10-CM | POA: Diagnosis not present

## 2017-09-08 DIAGNOSIS — I351 Nonrheumatic aortic (valve) insufficiency: Secondary | ICD-10-CM | POA: Diagnosis not present

## 2017-09-08 DIAGNOSIS — I130 Hypertensive heart and chronic kidney disease with heart failure and stage 1 through stage 4 chronic kidney disease, or unspecified chronic kidney disease: Secondary | ICD-10-CM | POA: Diagnosis not present

## 2017-09-08 DIAGNOSIS — I429 Cardiomyopathy, unspecified: Secondary | ICD-10-CM | POA: Diagnosis not present

## 2017-09-08 DIAGNOSIS — I5042 Chronic combined systolic (congestive) and diastolic (congestive) heart failure: Secondary | ICD-10-CM | POA: Diagnosis not present

## 2017-09-08 DIAGNOSIS — F419 Anxiety disorder, unspecified: Secondary | ICD-10-CM | POA: Diagnosis not present

## 2017-09-08 DIAGNOSIS — N182 Chronic kidney disease, stage 2 (mild): Secondary | ICD-10-CM | POA: Diagnosis not present

## 2017-09-08 NOTE — Progress Notes (Signed)
Daily Session Note  Patient Details  Name: [name] [name] MRN: [id number] Date of Birth: 03-29-1981 Referring Provider:     CARDIAC REHAB PHASE II ORIENTATION from 07/23/2017 in [place]  Referring Provider  Dr. Roxy Manns      Encounter Date: 09/08/2017  Check In: Session Check In - 09/08/17 1100      Check-In   Location  [facility]    Staff Present  Diane Angelina Pih, MS, EP, [facility], Exercise Physiologist;Jaiyanna Safran Wynetta Emery, RN, BSN;Gregory Cowan, BS, EP, Exercise Physiologist    Supervising physician immediately available to respond to emergencies  See telemetry face sheet for immediately available MD    Medication changes reported      No    Fall or balance concerns reported     No    Warm-up and Cool-down  Performed as group-led instruction    Resistance Training Performed  Yes    VAD Patient?  No      Pain Assessment   Currently in Pain?  No/denies    Pain Score  0-No pain    Multiple Pain Sites  No       Capillary Blood Glucose: No results found for this or any previous visit (from the past 24 hour(s)).    Social History   Tobacco Use  Smoking Status Never Smoker  Smokeless Tobacco Never Used    Goals Met:  Independence with exercise equipment Exercise tolerated well No report of cardiac concerns or symptoms Strength training completed today  Goals Unmet:  Not Applicable  Comments: Check out 1200.   Dr. Kate Sable is Medical Director for [facility] and [facility] Rehab.

## 2017-09-10 ENCOUNTER — Encounter (HOSPITAL_COMMUNITY): Payer: BLUE CROSS/BLUE SHIELD

## 2017-09-13 ENCOUNTER — Encounter (HOSPITAL_COMMUNITY)
Admission: RE | Admit: 2017-09-13 | Discharge: 2017-09-13 | Disposition: A | Discharge status: Home / Self Care | Payer: BLUE CROSS/BLUE SHIELD | Source: Ambulatory Visit | Attending: Thoracic Surgery (Cardiothoracic Vascular Surgery) | Admitting: Thoracic Surgery (Cardiothoracic Vascular Surgery)

## 2017-09-13 DIAGNOSIS — I351 Nonrheumatic aortic (valve) insufficiency: Secondary | ICD-10-CM | POA: Diagnosis not present

## 2017-09-13 DIAGNOSIS — Z9889 Other specified postprocedural states: Secondary | ICD-10-CM | POA: Insufficient documentation

## 2017-09-13 DIAGNOSIS — I5042 Chronic combined systolic (congestive) and diastolic (congestive) heart failure: Secondary | ICD-10-CM | POA: Insufficient documentation

## 2017-09-13 DIAGNOSIS — F419 Anxiety disorder, unspecified: Secondary | ICD-10-CM | POA: Insufficient documentation

## 2017-09-13 DIAGNOSIS — I429 Cardiomyopathy, unspecified: Secondary | ICD-10-CM | POA: Insufficient documentation

## 2017-09-13 DIAGNOSIS — N182 Chronic kidney disease, stage 2 (mild): Secondary | ICD-10-CM | POA: Insufficient documentation

## 2017-09-13 DIAGNOSIS — I130 Hypertensive heart and chronic kidney disease with heart failure and stage 1 through stage 4 chronic kidney disease, or unspecified chronic kidney disease: Secondary | ICD-10-CM | POA: Diagnosis not present

## 2017-09-13 NOTE — Progress Notes (Signed)
Cardiac Individual Treatment Plan  Patient Details  Name: [name] [name] MRN: [id number] Date of Birth: [birth year] Referring Provider:     [place] from 07/23/2017 in [place]  Referring Provider  Dr. Roxy Manns      Initial Encounter Date:    CARDIAC REHAB PHASE II ORIENTATION from 07/23/2017 in [place]  Date  07/23/17  Referring Provider  Dr. Roxy Manns      Visit Diagnosis: S/P aortic valve repair  Patient's Home Medications on Admission:  Current Outpatient Medications:  .  acetaminophen (TYLENOL) 500 MG tablet, Take 1,000 mg by mouth every 6 (six) hours as needed for moderate pain or headache. , Disp: , Rfl:  .  carvedilol (COREG) 25 MG tablet, Take 1 tablet (25 mg total) by mouth 2 (two) times daily with a meal., Disp: 60 tablet, Rfl: 6 .  ENTRESTO 97-103 MG, TAKE 1 TABLET BY MOUTH TWICE DAILY, Disp: 60 tablet, Rfl: 3 .  furosemide (LASIX) 40 MG tablet, Take 1 tablet (40 mg total) 2 (two) times daily by mouth. (Patient taking differently: Take 40 mg by mouth daily. ), Disp: 60 tablet, Rfl: 1 .  hydrALAZINE (APRESOLINE) 25 MG tablet, Take 1 tablet (25 mg total) by mouth 3 (three) times daily., Disp: 90 tablet, Rfl: 3 .  meclizine (ANTIVERT) 25 MG tablet, Take 1 tablet (25 mg total) by mouth 3 (three) times daily as needed for dizziness., Disp: 30 tablet, Rfl: 0  Past Medical History: Past Medical History:  Diagnosis Date  . Anxiety   . CHF (congestive heart failure) ([place])   . Chronic systolic heart failure ([place])   . CKD (chronic kidney disease) stage 2, GFR 60-89 ml/min   . Dyspnea    with value issues- "if I dont take my medication"  . Essential hypertension, benign   . Headache   . History of pneumonia   . Mitral regurgitation    Moderate  . Noncompliance   . Nonischemic cardiomyopathy ([place])    Normal coronaries May 2012, LVEF < 20%  . Pneumonia 2011  . S/P aortic valve repair 05/11/2017   Complex  valvuloplasty including plication of left coronary leaflet and 79mm Biostable HAART ring annuloplasty    Tobacco Use: Social History   Tobacco Use  Smoking Status Never Smoker  Smokeless Tobacco Never Used    Labs: Recent Review Flowsheet Data    Labs for ITP Cardiac and Pulmonary Rehab Latest Ref Rng & Units 05/12/2017 05/12/2017 05/12/2017 05/12/2017 05/13/2017   Cholestrol 0 - 200 mg/dL - - - - -   LDLCALC 0 - 99 mg/dL - - - - -   HDL >39 mg/dL - - - - -   Trlycerides <150 mg/dL - - - - -   Hemoglobin A1c 4.8 - 5.6 % - - - - -   PHART 7.350 - 7.450 - 7.405 - - -   PCO2ART 32.0 - 48.0 mmHg - 34.0 - - -   HCO3 20.0 - 28.0 mmol/L - 20.8 - - -   TCO2 22 - 32 mmol/L - - - 22 -   ACIDBASEDEF 0.0 - 2.0 mmol/L - 3.1(H) - - -   O2SAT % 96.1 96.7 74.5 - 91.3      Capillary Blood Glucose: Lab Results  Component Value Date   GLUCAP 134 (H) 05/14/2017   GLUCAP 125 (H) 05/14/2017   GLUCAP 119 (H) 05/13/2017   GLUCAP 121 (H) 05/13/2017   GLUCAP 138 (H)  05/13/2017     Exercise Target Goals:    Exercise Program Goal: Individual exercise prescription set using results from initial 6 min walk test and THRR while considering  patient's activity barriers and safety.   Exercise Prescription Goal: Starting with aerobic activity 30 plus minutes a day, 3 days per week for initial exercise prescription. Provide home exercise prescription and guidelines that participant acknowledges understanding prior to discharge.  Activity Barriers & Risk Stratification: Activity Barriers & Cardiac Risk Stratification - 07/23/17 1331      Activity Barriers & Cardiac Risk Stratification   Activity Barriers  None    Cardiac Risk Stratification  High       6 Minute Walk: 6 Minute Walk    Row Name 07/23/17 1330         6 Minute Walk   Phase  Initial     Distance  1800 feet     Distance % Change  0 %     Distance Feet Change  0 ft     Walk Time  6 minutes     # of Rest Breaks  0     MPH   3.4     METS  3.61     RPE  12     Perceived Dyspnea   12     VO2 Peak  18.84     Symptoms  No     Resting HR  77 bpm     Resting BP  126/74     Resting Oxygen Saturation   97 %     Exercise Oxygen Saturation  during 6 min walk  97 %     Max Ex. HR  93 bpm     Max Ex. BP  142/70     2 Minute Post BP  122/72        Oxygen Initial Assessment:   Oxygen Re-Evaluation:   Oxygen Discharge (Final Oxygen Re-Evaluation):   Initial Exercise Prescription: Initial Exercise Prescription - 07/23/17 1300      Date of Initial Exercise RX and Referring Provider   Date  07/23/17    Referring Provider  Dr. Roxy Manns      Treadmill   MPH  2.2    Grade  0    Minutes  15    METs  2.6      Recumbant Elliptical   Level  1    RPM  45    Watts  48    Minutes  20    METs  2.9      Prescription Details   Frequency (times per week)  3    Duration  Progress to 30 minutes of continuous aerobic without signs/symptoms of physical distress      Intensity   THRR 40-80% of Max Heartrate  [phone]    Ratings of Perceived Exertion  11-13    Perceived Dyspnea  0-4      Progression   Progression  Continue progressive overload as per policy without signs/symptoms or physical distress.      Resistance Training   Training Prescription  Yes    Weight  1    Reps  10-15       Perform Capillary Blood Glucose checks as needed.  Exercise Prescription Changes:  Exercise Prescription Changes    Row Name 07/28/17 1000 08/09/17 1400 08/25/17 1400 09/09/17 1000       Response to Exercise   Blood Pressure (Admit)  -  150/82  160/90  120/66  Blood Pressure (Exercise)  -  150/90  170/92  150/80    Blood Pressure (Exit)  -  150/70  156/80  120/70    Heart Rate (Admit)  -  72 bpm  73 bpm  70 bpm    Heart Rate (Exercise)  -  103 bpm  88 bpm  85 bpm    Heart Rate (Exit)  -  82 bpm  82 bpm  80 bpm    Rating of Perceived Exertion (Exercise)  -  11  10  10     Duration  -  Progress to 30 minutes of   aerobic without signs/symptoms of physical distress  Progress to 30 minutes of  aerobic without signs/symptoms of physical distress  Progress to 30 minutes of  aerobic without signs/symptoms of physical distress    Intensity  -  THRR New [id number]  THRR New 117-140-162  THRR New [phone]      Progression   Progression  -  Continue to progress workloads to maintain intensity without signs/symptoms of physical distress.  Continue to progress workloads to maintain intensity without signs/symptoms of physical distress.  Continue to progress workloads to maintain intensity without signs/symptoms of physical distress.      Resistance Training   Training Prescription  Yes  Yes  Yes  Yes    Weight  1  2  3  4     Reps  10-15  10-15  10-15  10-15      Treadmill   MPH  2.2  2.4  2.5  2.5    Grade  0  0  0  0    Minutes  15  15  15  15     METs  2.6  2.8  2.9  2.9      Recumbant Elliptical   Level  1  1  2  2     RPM  45  58  62  58    Watts  48  78  80  80    Minutes  20  20  20  20     METs  2.9  5  4.3  4.4      Home Exercise Plan   Plans to continue exercise at  Home (comment)  Home (comment)  Home (comment)  Home (comment)    Frequency  Add 2 additional days to program exercise sessions.  Add 2 additional days to program exercise sessions.  Add 2 additional days to program exercise sessions.  Add 2 additional days to program exercise sessions.    Initial Home Exercises Provided  07/28/17  07/28/17  07/28/17  07/28/17       Exercise Comments:  Exercise Comments    Row Name 07/28/17 1049 08/09/17 1426 08/25/17 1405 09/09/17 1012     Exercise Comments  Patient received the take home exercise plan today. THR was addressed as well as safety tips for being active when not in CR. Patient demonstrated an understanding and was encouraged to ask any future questions as they arise.  Patient has not progressed much in CR due to several health concerns. However he has increased his speed on the  treadmill.   Patient is doing well in CR. He has increased his speed on the treadmill as well as his level on the recumbent elliptical. Patient states that he feels some strength returning and feels more endurance.  Patient is doing well in CR and is mainataining his levels and watts on both machines. Patient has tsarted to  go back to work and states that he feels better with more stamina. Patient will continued to be monitored throughout the remainder of the program.        Exercise Goals and Review:  Exercise Goals    Row Name 07/23/17 1335             Exercise Goals   Increase Physical Activity  Yes       Intervention  Provide advice, education, support and counseling about physical activity/exercise needs.;Develop an individualized exercise prescription for aerobic and resistive training based on initial evaluation findings, risk stratification, comorbidities and participant's personal goals.       Expected Outcomes  Achievement of increased cardiorespiratory fitness and enhanced flexibility, muscular endurance and strength shown through measurements of functional capacity and personal statement of participant.       Increase Strength and Stamina  Yes       Intervention  Develop an individualized exercise prescription for aerobic and resistive training based on initial evaluation findings, risk stratification, comorbidities and participant's personal goals.;Provide advice, education, support and counseling about physical activity/exercise needs.       Expected Outcomes  Achievement of increased cardiorespiratory fitness and enhanced flexibility, muscular endurance and strength shown through measurements of functional capacity and personal statement of participant.       Able to understand and use rate of perceived exertion (RPE) scale  Yes       Intervention  Provide education and explanation on how to use RPE scale       Expected Outcomes  Short Term: Able to use RPE daily in rehab to  express subjective intensity level;Long Term:  Able to use RPE to guide intensity level when exercising independently       Able to understand and use Dyspnea scale  Yes       Intervention  Provide education and explanation on how to use Dyspnea scale       Expected Outcomes  Short Term: Able to use Dyspnea scale daily in rehab to express subjective sense of shortness of breath during exertion;Long Term: Able to use Dyspnea scale to guide intensity level when exercising independently       Knowledge and understanding of Target Heart Rate Range (THRR)  Yes       Intervention  Provide education and explanation of THRR including how the numbers were predicted and where they are located for reference       Expected Outcomes  Short Term: Able to state/look up THRR       Able to check pulse independently  Yes       Intervention  Provide education and demonstration on how to check pulse in carotid and radial arteries.;Review the importance of being able to check your own pulse for safety during independent exercise       Expected Outcomes  Short Term: Able to explain why pulse checking is important during independent exercise;Long Term: Able to check pulse independently and accurately       Understanding of Exercise Prescription  Yes       Intervention  Provide education, explanation, and written materials on patient's individual exercise prescription       Expected Outcomes  Short Term: Able to explain program exercise prescription;Long Term: Able to explain home exercise prescription to exercise independently          Exercise Goals Re-Evaluation : Exercise Goals Re-Evaluation    Row Name 08/25/17 1403 09/09/17 1011  Exercise Goal Re-Evaluation   Exercise Goals Review  Increase Physical Activity;Increase Strength and Stamina;Knowledge and understanding of Target Heart Rate Range (THRR)  Increase Physical Activity;Increase Strength and Stamina;Knowledge and understanding of Target Heart  Rate Range (THRR)      Comments  Patient is doing well in CR. He has increased his speed on the treadmill as well as his level on the recumbent elliptical. Patient states that he feels some strength returning and feels more endurance.   Patient is doing well in CR and is mainataining his levels and watts on both machines. Patient has tsarted to go back to work and states that he feels better with more stamina. Patient will continued to be monitored throughout the remainder of the program.       Expected Outcomes  Patient wishes to get stronger and to get back to work and to lose weight.   Patient wishes to get stronger and to get back to work and to lose weight.           Discharge Exercise Prescription (Final Exercise Prescription Changes): Exercise Prescription Changes - 09/09/17 1000      Response to Exercise   Blood Pressure (Admit)  120/66    Blood Pressure (Exercise)  150/80    Blood Pressure (Exit)  120/70    Heart Rate (Admit)  70 bpm    Heart Rate (Exercise)  85 bpm    Heart Rate (Exit)  80 bpm    Rating of Perceived Exertion (Exercise)  10    Duration  Progress to 30 minutes of  aerobic without signs/symptoms of physical distress    Intensity  THRR New [phone]      Progression   Progression  Continue to progress workloads to maintain intensity without signs/symptoms of physical distress.      Resistance Training   Training Prescription  Yes    Weight  4    Reps  10-15      Treadmill   MPH  2.5    Grade  0    Minutes  15    METs  2.9      Recumbant Elliptical   Level  2    RPM  58    Watts  80    Minutes  20    METs  4.4      Home Exercise Plan   Plans to continue exercise at  Home (comment)    Frequency  Add 2 additional days to program exercise sessions.    Initial Home Exercises Provided  07/28/17       Nutrition:  Target Goals: Understanding of nutrition guidelines, daily intake of sodium 1500mg , cholesterol 200mg , calories 30% from fat and 7% or  less from saturated fats, daily to have 5 or more servings of fruits and vegetables.  Biometrics: Pre Biometrics - 07/23/17 1336      Pre Biometrics   Height  5\' 10"  (1.778 m)    Waist Circumference  41.5 inches    Hip Circumference  42.5 inches    Waist to Hip Ratio  0.98 %    Triceps Skinfold  7 mm    % Body Fat  25.6 %    Grip Strength  56.8 kg    Flexibility  16 in    Single Leg Stand  60 seconds        Nutrition Therapy Plan and Nutrition Goals: Nutrition Therapy & Goals - 09/13/17 1343      Nutrition Therapy   RD  appointment deferred  Yes      Personal Nutrition Goals   Nutrition Goal  Patient is on a regular diet. His Medficts score was 97. He is interested in learning how to eat healthier.     Additional Goals?  No      Intervention Plan   Expected Outcomes  Short Term Goal: Understand basic principles of dietary content, such as calories, fat, sodium, cholesterol and nutrients.       Nutrition Assessments:   Nutrition Goals Re-Evaluation:   Nutrition Goals Discharge (Final Nutrition Goals Re-Evaluation):   Psychosocial: Target Goals: Acknowledge presence or absence of significant depression and/or stress, maximize coping skills, provide positive support system. Participant is able to verbalize types and ability to use techniques and skills needed for reducing stress and depression.  Initial Review & Psychosocial Screening: Initial Psych Review & Screening - 07/23/17 1426      Initial Review   Current issues with  None Identified      Family Dynamics   Good Support System?  Yes    Comments  His QOL score was 26.36 and his PHQ-9 score was 7. He says he gets down at times when he looks at his scars but is improving.       Barriers   Psychosocial barriers to participate in program  There are no identifiable barriers or psychosocial needs.       Quality of Life Scores: Quality of Life - 07/23/17 1336      Quality of Life Scores   Health/Function  Pre  25.2 %    Socioeconomic Pre  25.71 %    Psych/Spiritual Pre  29.14 %    Family Pre  27 %    GLOBAL Pre  26.36 %      Scores of 19 and below usually indicate a poorer quality of life in these areas.  A difference of  2-3 points is a clinically meaningful difference.  A difference of 2-3 points in the total score of the Quality of Life Index has been associated with significant improvement in overall quality of life, self-image, physical symptoms, and general health in studies assessing change in quality of life.  PHQ-9: Recent Review Flowsheet Data    Depression screen [facility] 2/9 07/23/2017   Decreased Interest 0   Down, Depressed, Hopeless 1   PHQ - 2 Score 1   Altered sleeping 3   Tired, decreased energy 1   Change in appetite 1   Feeling bad or failure about yourself  1   Trouble concentrating 0   Moving slowly or fidgety/restless 0   Suicidal thoughts 0   PHQ-9 Score 7   Difficult doing work/chores Not difficult at all     Interpretation of Total Score  Total Score Depression Severity:  1-4 = Minimal depression, 5-9 = Mild depression, 10-14 = Moderate depression, 15-19 = Moderately severe depression, 20-27 = Severe depression   Psychosocial Evaluation and Intervention: Psychosocial Evaluation - 07/23/17 1428      Psychosocial Evaluation & Interventions   Interventions  Encouraged to exercise with the program and follow exercise prescription;Stress management education;Relaxation education    Expected Outcomes  Patient will have no psychosocial barriers identified at discharge.     Continue Psychosocial Services   No Follow up required       Psychosocial Re-Evaluation: Psychosocial Re-Evaluation    [place] Name 08/23/17 1507 09/13/17 1347           Psychosocial Re-Evaluation   Current issues with  None Identified  None Identified      Comments  Patient initial QOL score was 26.36 and his PHQ-9 score was 7. He says when he looks at his scars, he feels down but is not  depressed. No barriers identified.   -      Expected Outcomes  Patient will have no psychosocial barriers identified at discharge.   Patient will have no psychosocial barriers identified at discharge.       Interventions  Stress management education;Encouraged to attend Cardiac Rehabilitation for the exercise;Relaxation education  Stress management education;Encouraged to attend Cardiac Rehabilitation for the exercise;Relaxation education      Continue Psychosocial Services   No Follow up required  No Follow up required         Psychosocial Discharge (Final Psychosocial Re-Evaluation): Psychosocial Re-Evaluation - 09/13/17 1347      Psychosocial Re-Evaluation   Current issues with  None Identified    Expected Outcomes  Patient will have no psychosocial barriers identified at discharge.     Interventions  Stress management education;Encouraged to attend Cardiac Rehabilitation for the exercise;Relaxation education    Continue Psychosocial Services   No Follow up required       Vocational Rehabilitation: Provide vocational rehab assistance to qualifying candidates.   Vocational Rehab Evaluation & Intervention: Vocational Rehab - 07/23/17 1422      Initial Vocational Rehab Evaluation & Intervention   Assessment shows need for Vocational Rehabilitation  No       Education: Education Goals: Education classes will be provided on a weekly basis, covering required topics. Participant will state understanding/return demonstration of topics presented.  Learning Barriers/Preferences: Learning Barriers/Preferences - 07/23/17 1421      Learning Barriers/Preferences   Learning Barriers  None    Learning Preferences  Written Material;Skilled Demonstration;Pictoral       Education Topics: Hypertension, Hypertension Reduction -Define heart disease and high blood pressure. Discus how high blood pressure affects the body and ways to reduce high blood pressure.   Exercise and Your  Heart -Discuss why it is important to exercise, the FITT principles of exercise, normal and abnormal responses to exercise, and how to exercise safely.   CARDIAC REHAB PHASE II EXERCISE from 09/08/2017 in [place]  Date  08/25/17  Educator  Larose  Instruction Review Code  2- Demonstrated Understanding      Angina -Discuss definition of angina, causes of angina, treatment of angina, and how to decrease risk of having angina.   CARDIAC REHAB PHASE II EXERCISE from 09/08/2017 in [place]  Date  09/01/17  Educator  DJ  Instruction Review Code  2- Demonstrated Understanding      Cardiac Medications -Review what the following cardiac medications are used for, how they affect the body, and side effects that may occur when taking the medications.  Medications include Aspirin, Beta blockers, calcium channel blockers, ACE Inhibitors, angiotensin receptor blockers, diuretics, digoxin, and antihyperlipidemics.   CARDIAC REHAB PHASE II EXERCISE from 09/08/2017 in [place]  Date  09/08/17  Educator  DJ  Instruction Review Code  2- Demonstrated Understanding      Congestive Heart Failure -Discuss the definition of CHF, how to live with CHF, the signs and symptoms of CHF, and how keep track of weight and sodium intake.   Heart Disease and Intimacy -Discus the effect sexual activity has on the heart, how changes occur during intimacy as we age, and safety during sexual activity.   Smoking Cessation /  COPD -Discuss different methods to quit smoking, the health benefits of quitting smoking, and the definition of COPD.   Nutrition I: Fats -Discuss the types of cholesterol, what cholesterol does to the heart, and how cholesterol levels can be controlled.   Nutrition II: Labels -Discuss the different components of food labels and how to read food label   Heart Parts/Heart Disease and PAD -Discuss the anatomy of the heart,  the pathway of blood circulation through the heart, and these are affected by heart disease.   Stress I: Signs and Symptoms -Discuss the causes of stress, how stress may lead to anxiety and depression, and ways to limit stress.   CARDIAC REHAB PHASE II EXERCISE from 09/08/2017 in [place]  Date  07/28/17  Educator  DJ  Instruction Review Code  2- Demonstrated Understanding      Stress II: Relaxation -Discuss different types of relaxation techniques to limit stress.   CARDIAC REHAB PHASE II EXERCISE from 09/08/2017 in [place]  Date  08/04/17  Educator  DC  Instruction Review Code  2- Demonstrated Understanding      Warning Signs of Stroke / TIA -Discuss definition of a stroke, what the signs and symptoms are of a stroke, and how to identify when someone is having stroke.   CARDIAC REHAB PHASE II EXERCISE from 09/08/2017 in [place]  Date  08/11/17  Educator  DC  Instruction Review Code  2- Demonstrated Understanding      Knowledge Questionnaire Score: Knowledge Questionnaire Score - 07/23/17 1421      Knowledge Questionnaire Score   Pre Score  18/24       Core Components/Risk Factors/Patient Goals at Admission: Personal Goals and Risk Factors at Admission - 07/23/17 1424      Core Components/Risk Factors/Patient Goals on Admission    Weight Management  Yes    Admit Weight  225 lb 1.6 oz (102.1 kg)    Goal Weight: Short Term  215 lb (97.5 kg)    Goal Weight: Long Term  205 lb (93 kg)    Expected Outcomes  Long Term: Adherence to nutrition and physical activity/exercise program aimed toward attainment of established weight goal;Weight Loss: Understanding of general recommendations for a balanced deficit meal plan, which promotes 1-2 lb weight loss per week and includes a negative energy balance of [phone] kcal/d;Weight Gain: Understanding of general recommendations for a high calorie, high protein  meal plan that promotes weight gain by distributing calorie intake throughout the day with the consumption for 4-5 meals, snacks, and/or supplements    Hypertension  Yes    Intervention  Provide education on lifestyle modifcations including regular physical activity/exercise, weight management, moderate sodium restriction and increased consumption of fresh fruit, vegetables, and low fat dairy, alcohol moderation, and smoking cessation.;Monitor prescription use compliance.    Expected Outcomes  Short Term: Continued assessment and intervention until BP is < 140/65mm HG in hypertensive participants. < 130/31mm HG in hypertensive participants with diabetes, heart failure or chronic kidney disease.;Long Term: Maintenance of blood pressure at goal levels.    Personal Goal Other  Yes    Personal Goal  Get stronger; Get back to work; Lose weight-get under 200 lbs.     Intervention  Patient will attend CR 3 days/week and supplement with exercise 2 days/week.     Expected Outcomes  Patient will meet his personal goals.        Core Components/Risk Factors/Patient Goals Review:  Goals and  Risk Factor Review    Row Name 08/23/17 1503 09/13/17 1344           Core Components/Risk Factors/Patient Goals Review   Personal Goals Review  Weight Management/Obesity;Hypertension;Other Get stronger; get back to work; lose down to 200 lbs.   Weight Management/Obesity;Hypertension;Other Get stronger; get back to work; lose down to 200 lbs.       Review  Patient has completed 11 sessions losing 1lb. He is doing well in the program with progression. He says he feels stronger and is able to more with his neices and nephews without getting tired. He has to complete the program before he can return to work. His cardiologist has not released him to return yet. His blood pressues tend to fluctuate with his initial readings still hypertensive. Will continue to monitor for progress.   Patient has completed 18 sessions gaining 4  lbs since last 30 day review. Patient is doing well in the program with progression. His cardiologist did release him to return to work and he passed the fitness test to return. He has returned to full time employment without difficulty. He says he feels stronger and better overall and is so very glad he is back at work. He plans to complete the progam. Will continue to monitor.       Expected Outcomes  Patient will continue to attend sessions and complete the program and meet his personal goals.   Patient will continue to attend sessions and complete the program and meet his personal goals.          Core Components/Risk Factors/Patient Goals at Discharge (Final Review):  Goals and Risk Factor Review - 09/13/17 1344      Core Components/Risk Factors/Patient Goals Review   Personal Goals Review  Weight Management/Obesity;Hypertension;Other Get stronger; get back to work; lose down to 200 lbs.     Review  Patient has completed 18 sessions gaining 4 lbs since last 30 day review. Patient is doing well in the program with progression. His cardiologist did release him to return to work and he passed the fitness test to return. He has returned to full time employment without difficulty. He says he feels stronger and better overall and is so very glad he is back at work. He plans to complete the progam. Will continue to monitor.     Expected Outcomes  Patient will continue to attend sessions and complete the program and meet his personal goals.        ITP Comments: ITP Comments    Row Name 07/26/17 1058           ITP Comments  Patient new to program completing 2 sessions. Will monitor for progress.           Comments: ITP 30 Day REVIEW Patient is doing well in the program. Will continue to monitor for progress.

## 2017-09-13 NOTE — Progress Notes (Signed)
Daily Session Note  Patient Details  Name: [name] [name] MRN: [id number] Date of Birth: December 13, 1980 Referring Provider:     CARDIAC REHAB PHASE II ORIENTATION from 07/23/2017 in [place]  Referring Provider  Dr. Roxy Manns      Encounter Date: 09/13/2017  Check In: Session Check In - 09/13/17 0938      Check-In   Location  [facility]    Staff Present  Russella Dar, MS, EP, [facility], Exercise Physiologist;Debra Wynetta Emery, RN, BSN;Mandrell Vangilder, BS, EP, Exercise Physiologist    Supervising physician immediately available to respond to emergencies  See telemetry face sheet for immediately available MD    Medication changes reported      No    Fall or balance concerns reported     No    Warm-up and Cool-down  Performed as group-led instruction    Resistance Training Performed  Yes    VAD Patient?  No      Pain Assessment   Currently in Pain?  No/denies    Pain Score  0-No pain    Multiple Pain Sites  No       Capillary Blood Glucose: No results found for this or any previous visit (from the past 24 hour(s)).    Social History   Tobacco Use  Smoking Status Never Smoker  Smokeless Tobacco Never Used    Goals Met:  Independence with exercise equipment Exercise tolerated well No report of cardiac concerns or symptoms Strength training completed today  Goals Unmet:  Not Applicable  Comments: Check out 1030   Dr. Kate Sable is Medical Director for [facility] and [facility].

## 2017-09-15 ENCOUNTER — Encounter (HOSPITAL_COMMUNITY): Payer: BLUE CROSS/BLUE SHIELD

## 2017-09-16 DIAGNOSIS — E6609 Other obesity due to excess calories: Secondary | ICD-10-CM | POA: Diagnosis not present

## 2017-09-16 DIAGNOSIS — R42 Dizziness and giddiness: Secondary | ICD-10-CM | POA: Diagnosis not present

## 2017-09-16 DIAGNOSIS — Z6834 Body mass index (BMI) 34.0-34.9, adult: Secondary | ICD-10-CM | POA: Diagnosis not present

## 2017-09-17 ENCOUNTER — Encounter (HOSPITAL_COMMUNITY): Payer: BLUE CROSS/BLUE SHIELD

## 2017-09-20 ENCOUNTER — Encounter (HOSPITAL_COMMUNITY): Payer: BLUE CROSS/BLUE SHIELD

## 2017-09-22 ENCOUNTER — Encounter (HOSPITAL_COMMUNITY): Payer: BLUE CROSS/BLUE SHIELD

## 2017-09-24 ENCOUNTER — Encounter (HOSPITAL_COMMUNITY): Payer: BLUE CROSS/BLUE SHIELD

## 2017-09-27 ENCOUNTER — Encounter: Payer: Self-pay | Admitting: Neurology

## 2017-09-27 ENCOUNTER — Encounter (HOSPITAL_COMMUNITY): Payer: BLUE CROSS/BLUE SHIELD

## 2017-09-29 ENCOUNTER — Encounter (HOSPITAL_COMMUNITY): Payer: BLUE CROSS/BLUE SHIELD

## 2017-09-30 NOTE — Progress Notes (Signed)
Discharge Progress Report  Patient Details  Name: [name] [name] MRN: [id number] Date of Birth: October 06, 1980 Referring Provider:     [facility] from 07/23/2017 in [place]  Referring Provider  Dr. Roxy Manns       Number of Visits: 18  Reason for Discharge:  Early Exit:  Lack of attendance/back to work.  Smoking History:  Social History   Tobacco Use  Smoking Status Never Smoker  Smokeless Tobacco Never Used    Diagnosis:  S/P aortic valve repair  ADL [facility]:   Initial Exercise Prescription: Initial Exercise Prescription - 07/23/17 1300      Date of Initial Exercise RX and Referring Provider   Date  07/23/17    Referring Provider  Dr. Roxy Manns      Treadmill   MPH  2.2    Grade  0    Minutes  15    METs  2.6      Recumbant Elliptical   Level  1    RPM  45    Watts  48    Minutes  20    METs  2.9      Prescription Details   Frequency (times per week)  3    Duration  Progress to 30 minutes of continuous aerobic without signs/symptoms of physical distress      Intensity   THRR 40-80% of Max Heartrate  [phone]    Ratings of Perceived Exertion  11-13    Perceived Dyspnea  0-4      Progression   Progression  Continue progressive overload as per policy without signs/symptoms or physical distress.      Resistance Training   Training Prescription  Yes    Weight  1    Reps  10-15       Discharge Exercise Prescription (Final Exercise Prescription Changes): Exercise Prescription Changes - 09/09/17 1000      Response to Exercise   Blood Pressure (Admit)  120/66    Blood Pressure (Exercise)  150/80    Blood Pressure (Exit)  120/70    Heart Rate (Admit)  70 bpm    Heart Rate (Exercise)  85 bpm    Heart Rate (Exit)  80 bpm    Rating of Perceived Exertion (Exercise)  10    Duration  Progress to 30 minutes of  aerobic without signs/symptoms of physical distress    Intensity  THRR New [phone]      Progression    Progression  Continue to progress workloads to maintain intensity without signs/symptoms of physical distress.      Resistance Training   Training Prescription  Yes    Weight  4    Reps  10-15      Treadmill   MPH  2.5    Grade  0    Minutes  15    METs  2.9      Recumbant Elliptical   Level  2    RPM  58    Watts  80    Minutes  20    METs  4.4      Home Exercise Plan   Plans to continue exercise at  Home (comment)    Frequency  Add 2 additional days to program exercise sessions.    Initial Home Exercises Provided  07/28/17       Functional Capacity: 6 Minute Walk    Row Name 07/23/17 1330         6 Minute  Walk   Phase  Initial     Distance  1800 feet     Distance % Change  0 %     Distance Feet Change  0 ft     Walk Time  6 minutes     # of Rest Breaks  0     MPH  3.4     METS  3.61     RPE  12     Perceived Dyspnea   12     VO2 Peak  18.84     Symptoms  No     Resting HR  77 bpm     Resting BP  126/74     Resting Oxygen Saturation   97 %     Exercise Oxygen Saturation  during 6 min walk  97 %     Max Ex. HR  93 bpm     Max Ex. BP  142/70     2 Minute Post BP  122/72        Psychological, QOL, Others - Outcomes: PHQ 2/9: Depression screen PHQ 2/9 07/23/2017  Decreased Interest 0  Down, Depressed, Hopeless 1  PHQ - 2 Score 1  Altered sleeping 3  Tired, decreased energy 1  Change in appetite 1  Feeling bad or failure about yourself  1  Trouble concentrating 0  Moving slowly or fidgety/restless 0  Suicidal thoughts 0  PHQ-9 Score 7  Difficult doing work/chores Not difficult at all    Quality of Life: Quality of Life - 07/23/17 1336      Quality of Life Scores   Health/Function Pre  25.2 %    Socioeconomic Pre  25.71 %    Psych/Spiritual Pre  29.14 %    Family Pre  27 %    GLOBAL Pre  26.36 %       Personal Goals: Goals established at orientation with interventions provided to work toward goal. Personal Goals and Risk Factors at  Admission - 07/23/17 1424      Core Components/Risk Factors/Patient Goals on Admission    Weight Management  Yes    Admit Weight  225 lb 1.6 oz (102.1 kg)    Goal Weight: Short Term  215 lb (97.5 kg)    Goal Weight: Long Term  205 lb (93 kg)    Expected Outcomes  Long Term: Adherence to nutrition and physical activity/exercise program aimed toward attainment of established weight goal;Weight Loss: Understanding of general recommendations for a balanced deficit meal plan, which promotes 1-2 lb weight loss per week and includes a negative energy balance of [phone] kcal/d;Weight Gain: Understanding of general recommendations for a high calorie, high protein meal plan that promotes weight gain by distributing calorie intake throughout the day with the consumption for 4-5 meals, snacks, and/or supplements    Hypertension  Yes    Intervention  Provide education on lifestyle modifcations including regular physical activity/exercise, weight management, moderate sodium restriction and increased consumption of fresh fruit, vegetables, and low fat dairy, alcohol moderation, and smoking cessation.;Monitor prescription use compliance.    Expected Outcomes  Short Term: Continued assessment and intervention until BP is < 140/52mm HG in hypertensive participants. < 130/62mm HG in hypertensive participants with diabetes, heart failure or chronic kidney disease.;Long Term: Maintenance of blood pressure at goal levels.    Personal Goal Other  Yes    Personal Goal  Get stronger; Get back to work; Lose weight-get under 200 lbs.     Intervention  Patient will  attend CR 3 days/week and supplement with exercise 2 days/week.     Expected Outcomes  Patient will meet his personal goals.         Personal Goals Discharge: Goals and Risk Factor Review    Row Name 08/23/17 1503 09/13/17 1344           Core Components/Risk Factors/Patient Goals Review   Personal Goals Review  Weight Management/Obesity;Hypertension;Other  Get stronger; get back to work; lose down to 200 lbs.   Weight Management/Obesity;Hypertension;Other Get stronger; get back to work; lose down to 200 lbs.       Review  Patient has completed 11 sessions losing 1lb. He is doing well in the program with progression. He says he feels stronger and is able to more with his neices and nephews without getting tired. He has to complete the program before he can return to work. His cardiologist has not released him to return yet. His blood pressues tend to fluctuate with his initial readings still hypertensive. Will continue to monitor for progress.   Patient has completed 18 sessions gaining 4 lbs since last 30 day review. Patient is doing well in the program with progression. His cardiologist did release him to return to work and he passed the fitness test to return. He has returned to full time employment without difficulty. He says he feels stronger and better overall and is so very glad he is back at work. He plans to complete the progam. Will continue to monitor.       Expected Outcomes  Patient will continue to attend sessions and complete the program and meet his personal goals.   Patient will continue to attend sessions and complete the program and meet his personal goals.          Exercise Goals and Review: Exercise Goals    Row Name 07/23/17 1335             Exercise Goals   Increase Physical Activity  Yes       Intervention  Provide advice, education, support and counseling about physical activity/exercise needs.;Develop an individualized exercise prescription for aerobic and resistive training based on initial evaluation findings, risk stratification, comorbidities and participant's personal goals.       Expected Outcomes  Achievement of increased cardiorespiratory fitness and enhanced flexibility, muscular endurance and strength shown through measurements of functional capacity and personal statement of participant.       Increase Strength and  Stamina  Yes       Intervention  Develop an individualized exercise prescription for aerobic and resistive training based on initial evaluation findings, risk stratification, comorbidities and participant's personal goals.;Provide advice, education, support and counseling about physical activity/exercise needs.       Expected Outcomes  Achievement of increased cardiorespiratory fitness and enhanced flexibility, muscular endurance and strength shown through measurements of functional capacity and personal statement of participant.       Able to understand and use rate of perceived exertion (RPE) scale  Yes       Intervention  Provide education and explanation on how to use RPE scale       Expected Outcomes  Short Term: Able to use RPE daily in rehab to express subjective intensity level;Long Term:  Able to use RPE to guide intensity level when exercising independently       Able to understand and use Dyspnea scale  Yes       Intervention  Provide education and explanation on how to use  Dyspnea scale       Expected Outcomes  Short Term: Able to use Dyspnea scale daily in rehab to express subjective sense of shortness of breath during exertion;Long Term: Able to use Dyspnea scale to guide intensity level when exercising independently       Knowledge and understanding of Target Heart Rate Range (THRR)  Yes       Intervention  Provide education and explanation of THRR including how the numbers were predicted and where they are located for reference       Expected Outcomes  Short Term: Able to state/look up THRR       Able to check pulse independently  Yes       Intervention  Provide education and demonstration on how to check pulse in carotid and radial arteries.;Review the importance of being able to check your own pulse for safety during independent exercise       Expected Outcomes  Short Term: Able to explain why pulse checking is important during independent exercise;Long Term: Able to check pulse  independently and accurately       Understanding of Exercise Prescription  Yes       Intervention  Provide education, explanation, and written materials on patient's individual exercise prescription       Expected Outcomes  Short Term: Able to explain program exercise prescription;Long Term: Able to explain home exercise prescription to exercise independently          Nutrition & Weight - Outcomes: Pre Biometrics - 07/23/17 1336      Pre Biometrics   Height  5\' 10"  (1.778 m)    Waist Circumference  41.5 inches    Hip Circumference  42.5 inches    Waist to Hip Ratio  0.98 %    Triceps Skinfold  7 mm    % Body Fat  25.6 %    Grip Strength  56.8 kg    Flexibility  16 in    Single Leg Stand  60 seconds        Nutrition: Nutrition Therapy & Goals - 09/13/17 1343      Nutrition Therapy   RD appointment deferred  Yes      Personal Nutrition Goals   Nutrition Goal  Patient is on a regular diet. His Medficts score was 97. He is interested in learning how to eat healthier.     Additional Goals?  No      Intervention Plan   Expected Outcomes  Short Term Goal: Understand basic principles of dietary content, such as calories, fat, sodium, cholesterol and nutrients.       Nutrition Discharge:   Education Questionnaire Score: Knowledge Questionnaire Score - 07/23/17 1421      Knowledge Questionnaire Score   Pre Score  18/24

## 2017-09-30 NOTE — Progress Notes (Signed)
Cardiac Individual Treatment Plan  Patient Details  Name: [name] [name] MRN: [id number] Date of Birth: 1980/09/17 Referring Provider:     [place] from 07/23/2017 in [facility]  Referring Provider  Dr. Roxy Manns      Initial Encounter Date:    CARDIAC REHAB PHASE II ORIENTATION from 07/23/2017 in [facility]  Date  07/23/17  Referring Provider  Dr. Roxy Manns      Visit Diagnosis: S/P aortic valve repair  Patient's Home Medications on Admission:  Current Outpatient Medications:  .  acetaminophen (TYLENOL) 500 MG tablet, Take 1,000 mg by mouth every 6 (six) hours as needed for moderate pain or headache. , Disp: , Rfl:  .  carvedilol (COREG) 25 MG tablet, Take 1 tablet (25 mg total) by mouth 2 (two) times daily with a meal., Disp: 60 tablet, Rfl: 6 .  ENTRESTO 97-103 MG, TAKE 1 TABLET BY MOUTH TWICE DAILY, Disp: 60 tablet, Rfl: 3 .  furosemide (LASIX) 40 MG tablet, Take 1 tablet (40 mg total) 2 (two) times daily by mouth. (Patient taking differently: Take 40 mg by mouth daily. ), Disp: 60 tablet, Rfl: 1 .  hydrALAZINE (APRESOLINE) 25 MG tablet, Take 1 tablet (25 mg total) by mouth 3 (three) times daily., Disp: 90 tablet, Rfl: 3 .  meclizine (ANTIVERT) 25 MG tablet, Take 1 tablet (25 mg total) by mouth 3 (three) times daily as needed for dizziness., Disp: 30 tablet, Rfl: 0  Past Medical History: Past Medical History:  Diagnosis Date  . Anxiety   . CHF (congestive heart failure) ([place])   . Chronic systolic heart failure ([place])   . CKD (chronic kidney disease) stage 2, GFR 60-89 ml/min   . Dyspnea    with value issues- "if I dont take my medication"  . Essential hypertension, benign   . Headache   . History of pneumonia   . Mitral regurgitation    Moderate  . Noncompliance   . Nonischemic cardiomyopathy ([place])    Normal coronaries May 2012, LVEF < 20%  . Pneumonia 2011  . S/P aortic valve repair 05/11/2017   Complex  valvuloplasty including plication of left coronary leaflet and 12mm Biostable HAART ring annuloplasty    Tobacco Use: Social History   Tobacco Use  Smoking Status Never Smoker  Smokeless Tobacco Never Used    Labs: Recent Review Flowsheet Data    Labs for ITP Cardiac and Pulmonary Rehab Latest Ref Rng & Units 05/12/2017 05/12/2017 05/12/2017 05/12/2017 05/13/2017   Cholestrol 0 - 200 mg/dL - - - - -   LDLCALC 0 - 99 mg/dL - - - - -   HDL >39 mg/dL - - - - -   Trlycerides <150 mg/dL - - - - -   Hemoglobin A1c 4.8 - 5.6 % - - - - -   PHART 7.350 - 7.450 - 7.405 - - -   PCO2ART 32.0 - 48.0 mmHg - 34.0 - - -   HCO3 20.0 - 28.0 mmol/L - 20.8 - - -   TCO2 22 - 32 mmol/L - - - 22 -   ACIDBASEDEF 0.0 - 2.0 mmol/L - 3.1(H) - - -   O2SAT % 96.1 96.7 74.5 - 91.3      Capillary Blood Glucose: Lab Results  Component Value Date   GLUCAP 134 (H) 05/14/2017   GLUCAP 125 (H) 05/14/2017   GLUCAP 119 (H) 05/13/2017   GLUCAP 121 (H) 05/13/2017   GLUCAP 138 (H)  05/13/2017     Exercise Target Goals:    Exercise Program Goal: Individual exercise prescription set using results from initial 6 min walk test and THRR while considering  patient's activity barriers and safety.   Exercise Prescription Goal: Starting with aerobic activity 30 plus minutes a day, 3 days per week for initial exercise prescription. Provide home exercise prescription and guidelines that participant acknowledges understanding prior to discharge.  Activity Barriers & Risk Stratification: Activity Barriers & Cardiac Risk Stratification - 07/23/17 1331      Activity Barriers & Cardiac Risk Stratification   Activity Barriers  None    Cardiac Risk Stratification  High       6 Minute Walk: 6 Minute Walk    Row Name 07/23/17 1330         6 Minute Walk   Phase  Initial     Distance  1800 feet     Distance % Change  0 %     Distance Feet Change  0 ft     Walk Time  6 minutes     # of Rest Breaks  0     MPH   3.4     METS  3.61     RPE  12     Perceived Dyspnea   12     VO2 Peak  18.84     Symptoms  No     Resting HR  77 bpm     Resting BP  126/74     Resting Oxygen Saturation   97 %     Exercise Oxygen Saturation  during 6 min walk  97 %     Max Ex. HR  93 bpm     Max Ex. BP  142/70     2 Minute Post BP  122/72        Oxygen Initial Assessment:   Oxygen Re-Evaluation:   Oxygen Discharge (Final Oxygen Re-Evaluation):   Initial Exercise Prescription: Initial Exercise Prescription - 07/23/17 1300      Date of Initial Exercise RX and Referring Provider   Date  07/23/17    Referring Provider  Dr. Roxy Manns      Treadmill   MPH  2.2    Grade  0    Minutes  15    METs  2.6      Recumbant Elliptical   Level  1    RPM  45    Watts  48    Minutes  20    METs  2.9      Prescription Details   Frequency (times per week)  3    Duration  Progress to 30 minutes of continuous aerobic without signs/symptoms of physical distress      Intensity   THRR 40-80% of Max Heartrate  [phone]    Ratings of Perceived Exertion  11-13    Perceived Dyspnea  0-4      Progression   Progression  Continue progressive overload as per policy without signs/symptoms or physical distress.      Resistance Training   Training Prescription  Yes    Weight  1    Reps  10-15       Perform Capillary Blood Glucose checks as needed.  Exercise Prescription Changes:  Exercise Prescription Changes    Row Name 07/28/17 1000 08/09/17 1400 08/25/17 1400 09/09/17 1000       Response to Exercise   Blood Pressure (Admit)  -  150/82  160/90  120/66  Blood Pressure (Exercise)  -  150/90  170/92  150/80    Blood Pressure (Exit)  -  150/70  156/80  120/70    Heart Rate (Admit)  -  72 bpm  73 bpm  70 bpm    Heart Rate (Exercise)  -  103 bpm  88 bpm  85 bpm    Heart Rate (Exit)  -  82 bpm  82 bpm  80 bpm    Rating of Perceived Exertion (Exercise)  -  11  10  10     Duration  -  Progress to 30 minutes of   aerobic without signs/symptoms of physical distress  Progress to 30 minutes of  aerobic without signs/symptoms of physical distress  Progress to 30 minutes of  aerobic without signs/symptoms of physical distress    Intensity  -  THRR New [phone]  THRR New 117-140-162  THRR New [phone]      Progression   Progression  -  Continue to progress workloads to maintain intensity without signs/symptoms of physical distress.  Continue to progress workloads to maintain intensity without signs/symptoms of physical distress.  Continue to progress workloads to maintain intensity without signs/symptoms of physical distress.      Resistance Training   Training Prescription  Yes  Yes  Yes  Yes    Weight  1  2  3  4     Reps  10-15  10-15  10-15  10-15      Treadmill   MPH  2.2  2.4  2.5  2.5    Grade  0  0  0  0    Minutes  15  15  15  15     METs  2.6  2.8  2.9  2.9      Recumbant Elliptical   Level  1  1  2  2     RPM  45  58  62  58    Watts  48  78  80  80    Minutes  20  20  20  20     METs  2.9  5  4.3  4.4      Home Exercise Plan   Plans to continue exercise at  Home (comment)  Home (comment)  Home (comment)  Home (comment)    Frequency  Add 2 additional days to program exercise sessions.  Add 2 additional days to program exercise sessions.  Add 2 additional days to program exercise sessions.  Add 2 additional days to program exercise sessions.    Initial Home Exercises Provided  07/28/17  07/28/17  07/28/17  07/28/17       Exercise Comments:  Exercise Comments    Row Name 07/28/17 1049 08/09/17 1426 08/25/17 1405 09/09/17 1012     Exercise Comments  Patient received the take home exercise plan today. THR was addressed as well as safety tips for being active when not in CR. Patient demonstrated an understanding and was encouraged to ask any future questions as they arise.  Patient has not progressed much in CR due to several health concerns. However he has increased his speed on the  treadmill.   Patient is doing well in CR. He has increased his speed on the treadmill as well as his level on the recumbent elliptical. Patient states that he feels some strength returning and feels more endurance.  Patient is doing well in CR and is mainataining his levels and watts on both machines. Patient has tsarted to  go back to work and states that he feels better with more stamina. Patient will continued to be monitored throughout the remainder of the program.        Exercise Goals and Review:  Exercise Goals    Row Name 07/23/17 1335             Exercise Goals   Increase Physical Activity  Yes       Intervention  Provide advice, education, support and counseling about physical activity/exercise needs.;Develop an individualized exercise prescription for aerobic and resistive training based on initial evaluation findings, risk stratification, comorbidities and participant's personal goals.       Expected Outcomes  Achievement of increased cardiorespiratory fitness and enhanced flexibility, muscular endurance and strength shown through measurements of functional capacity and personal statement of participant.       Increase Strength and Stamina  Yes       Intervention  Develop an individualized exercise prescription for aerobic and resistive training based on initial evaluation findings, risk stratification, comorbidities and participant's personal goals.;Provide advice, education, support and counseling about physical activity/exercise needs.       Expected Outcomes  Achievement of increased cardiorespiratory fitness and enhanced flexibility, muscular endurance and strength shown through measurements of functional capacity and personal statement of participant.       Able to understand and use rate of perceived exertion (RPE) scale  Yes       Intervention  Provide education and explanation on how to use RPE scale       Expected Outcomes  Short Term: Able to use RPE daily in rehab to  express subjective intensity level;Long Term:  Able to use RPE to guide intensity level when exercising independently       Able to understand and use Dyspnea scale  Yes       Intervention  Provide education and explanation on how to use Dyspnea scale       Expected Outcomes  Short Term: Able to use Dyspnea scale daily in rehab to express subjective sense of shortness of breath during exertion;Long Term: Able to use Dyspnea scale to guide intensity level when exercising independently       Knowledge and understanding of Target Heart Rate Range (THRR)  Yes       Intervention  Provide education and explanation of THRR including how the numbers were predicted and where they are located for reference       Expected Outcomes  Short Term: Able to state/look up THRR       Able to check pulse independently  Yes       Intervention  Provide education and demonstration on how to check pulse in carotid and radial arteries.;Review the importance of being able to check your own pulse for safety during independent exercise       Expected Outcomes  Short Term: Able to explain why pulse checking is important during independent exercise;Long Term: Able to check pulse independently and accurately       Understanding of Exercise Prescription  Yes       Intervention  Provide education, explanation, and written materials on patient's individual exercise prescription       Expected Outcomes  Short Term: Able to explain program exercise prescription;Long Term: Able to explain home exercise prescription to exercise independently          Exercise Goals Re-Evaluation : Exercise Goals Re-Evaluation    Row Name 08/25/17 1403 09/09/17 1011  Exercise Goal Re-Evaluation   Exercise Goals Review  Increase Physical Activity;Increase Strength and Stamina;Knowledge and understanding of Target Heart Rate Range (THRR)  Increase Physical Activity;Increase Strength and Stamina;Knowledge and understanding of Target Heart  Rate Range (THRR)      Comments  Patient is doing well in CR. He has increased his speed on the treadmill as well as his level on the recumbent elliptical. Patient states that he feels some strength returning and feels more endurance.   Patient is doing well in CR and is mainataining his levels and watts on both machines. Patient has tsarted to go back to work and states that he feels better with more stamina. Patient will continued to be monitored throughout the remainder of the program.       Expected Outcomes  Patient wishes to get stronger and to get back to work and to lose weight.   Patient wishes to get stronger and to get back to work and to lose weight.           Discharge Exercise Prescription (Final Exercise Prescription Changes): Exercise Prescription Changes - 09/09/17 1000      Response to Exercise   Blood Pressure (Admit)  120/66    Blood Pressure (Exercise)  150/80    Blood Pressure (Exit)  120/70    Heart Rate (Admit)  70 bpm    Heart Rate (Exercise)  85 bpm    Heart Rate (Exit)  80 bpm    Rating of Perceived Exertion (Exercise)  10    Duration  Progress to 30 minutes of  aerobic without signs/symptoms of physical distress    Intensity  THRR New [phone]      Progression   Progression  Continue to progress workloads to maintain intensity without signs/symptoms of physical distress.      Resistance Training   Training Prescription  Yes    Weight  4    Reps  10-15      Treadmill   MPH  2.5    Grade  0    Minutes  15    METs  2.9      Recumbant Elliptical   Level  2    RPM  58    Watts  80    Minutes  20    METs  4.4      Home Exercise Plan   Plans to continue exercise at  Home (comment)    Frequency  Add 2 additional days to program exercise sessions.    Initial Home Exercises Provided  07/28/17       Nutrition:  Target Goals: Understanding of nutrition guidelines, daily intake of sodium 1500mg , cholesterol 200mg , calories 30% from fat and 7% or  less from saturated fats, daily to have 5 or more servings of fruits and vegetables.  Biometrics: Pre Biometrics - 07/23/17 1336      Pre Biometrics   Height  5\' 10"  (1.778 m)    Waist Circumference  41.5 inches    Hip Circumference  42.5 inches    Waist to Hip Ratio  0.98 %    Triceps Skinfold  7 mm    % Body Fat  25.6 %    Grip Strength  56.8 kg    Flexibility  16 in    Single Leg Stand  60 seconds        Nutrition Therapy Plan and Nutrition Goals: Nutrition Therapy & Goals - 09/13/17 1343      Nutrition Therapy   RD  appointment deferred  Yes      Personal Nutrition Goals   Nutrition Goal  Patient is on a regular diet. His Medficts score was 97. He is interested in learning how to eat healthier.     Additional Goals?  No      Intervention Plan   Expected Outcomes  Short Term Goal: Understand basic principles of dietary content, such as calories, fat, sodium, cholesterol and nutrients.       Nutrition Assessments:   Nutrition Goals Re-Evaluation:   Nutrition Goals Discharge (Final Nutrition Goals Re-Evaluation):   Psychosocial: Target Goals: Acknowledge presence or absence of significant depression and/or stress, maximize coping skills, provide positive support system. Participant is able to verbalize types and ability to use techniques and skills needed for reducing stress and depression.  Initial Review & Psychosocial Screening: Initial Psych Review & Screening - 07/23/17 1426      Initial Review   Current issues with  None Identified      Family Dynamics   Good Support System?  Yes    Comments  His QOL score was 26.36 and his PHQ-9 score was 7. He says he gets down at times when he looks at his scars but is improving.       Barriers   Psychosocial barriers to participate in program  There are no identifiable barriers or psychosocial needs.       Quality of Life Scores: Quality of Life - 07/23/17 1336      Quality of Life Scores   Health/Function  Pre  25.2 %    Socioeconomic Pre  25.71 %    Psych/Spiritual Pre  29.14 %    Family Pre  27 %    GLOBAL Pre  26.36 %      Scores of 19 and below usually indicate a poorer quality of life in these areas.  A difference of  2-3 points is a clinically meaningful difference.  A difference of 2-3 points in the total score of the Quality of Life Index has been associated with significant improvement in overall quality of life, self-image, physical symptoms, and general health in studies assessing change in quality of life.  PHQ-9: Recent Review Flowsheet Data    Depression screen [facility] 2/9 07/23/2017   Decreased Interest 0   Down, Depressed, Hopeless 1   PHQ - 2 Score 1   Altered sleeping 3   Tired, decreased energy 1   Change in appetite 1   Feeling bad or failure about yourself  1   Trouble concentrating 0   Moving slowly or fidgety/restless 0   Suicidal thoughts 0   PHQ-9 Score 7   Difficult doing work/chores Not difficult at all     Interpretation of Total Score  Total Score Depression Severity:  1-4 = Minimal depression, 5-9 = Mild depression, 10-14 = Moderate depression, 15-19 = Moderately severe depression, 20-27 = Severe depression   Psychosocial Evaluation and Intervention: Psychosocial Evaluation - 07/23/17 1428      Psychosocial Evaluation & Interventions   Interventions  Encouraged to exercise with the program and follow exercise prescription;Stress management education;Relaxation education    Expected Outcomes  Patient will have no psychosocial barriers identified at discharge.     Continue Psychosocial Services   No Follow up required       Psychosocial Re-Evaluation: Psychosocial Re-Evaluation    [place] Name 08/23/17 1507 09/13/17 1347           Psychosocial Re-Evaluation   Current issues with  None Identified  None Identified      Comments  Patient initial QOL score was 26.36 and his PHQ-9 score was 7. He says when he looks at his scars, he feels down but is not  depressed. No barriers identified.   -      Expected Outcomes  Patient will have no psychosocial barriers identified at discharge.   Patient will have no psychosocial barriers identified at discharge.       Interventions  Stress management education;Encouraged to attend Cardiac Rehabilitation for the exercise;Relaxation education  Stress management education;Encouraged to attend Cardiac Rehabilitation for the exercise;Relaxation education      Continue Psychosocial Services   No Follow up required  No Follow up required         Psychosocial Discharge (Final Psychosocial Re-Evaluation): Psychosocial Re-Evaluation - 09/13/17 1347      Psychosocial Re-Evaluation   Current issues with  None Identified    Expected Outcomes  Patient will have no psychosocial barriers identified at discharge.     Interventions  Stress management education;Encouraged to attend Cardiac Rehabilitation for the exercise;Relaxation education    Continue Psychosocial Services   No Follow up required       Vocational Rehabilitation: Provide vocational rehab assistance to qualifying candidates.   Vocational Rehab Evaluation & Intervention: Vocational Rehab - 07/23/17 1422      Initial Vocational Rehab Evaluation & Intervention   Assessment shows need for Vocational Rehabilitation  No       Education: Education Goals: Education classes will be provided on a weekly basis, covering required topics. Participant will state understanding/return demonstration of topics presented.  Learning Barriers/Preferences: Learning Barriers/Preferences - 07/23/17 1421      Learning Barriers/Preferences   Learning Barriers  None    Learning Preferences  Written Material;Skilled Demonstration;Pictoral       Education Topics: Hypertension, Hypertension Reduction -Define heart disease and high blood pressure. Discus how high blood pressure affects the body and ways to reduce high blood pressure.   Exercise and Your  Heart -Discuss why it is important to exercise, the FITT principles of exercise, normal and abnormal responses to exercise, and how to exercise safely.   CARDIAC REHAB PHASE II EXERCISE from 09/08/2017 in [place]  Date  08/25/17  Educator  [place]  Instruction Review Code  2- Demonstrated Understanding      Angina -Discuss definition of angina, causes of angina, treatment of angina, and how to decrease risk of having angina.   CARDIAC REHAB PHASE II EXERCISE from 09/08/2017 in [place]  Date  09/01/17  Educator  DJ  Instruction Review Code  2- Demonstrated Understanding      Cardiac Medications -Review what the following cardiac medications are used for, how they affect the body, and side effects that may occur when taking the medications.  Medications include Aspirin, Beta blockers, calcium channel blockers, ACE Inhibitors, angiotensin receptor blockers, diuretics, digoxin, and antihyperlipidemics.   CARDIAC REHAB PHASE II EXERCISE from 09/08/2017 in [place]  Date  09/08/17  Educator  DJ  Instruction Review Code  2- Demonstrated Understanding      Congestive Heart Failure -Discuss the definition of CHF, how to live with CHF, the signs and symptoms of CHF, and how keep track of weight and sodium intake.   Heart Disease and Intimacy -Discus the effect sexual activity has on the heart, how changes occur during intimacy as we age, and safety during sexual activity.   Smoking Cessation /  COPD -Discuss different methods to quit smoking, the health benefits of quitting smoking, and the definition of COPD.   Nutrition I: Fats -Discuss the types of cholesterol, what cholesterol does to the heart, and how cholesterol levels can be controlled.   Nutrition II: Labels -Discuss the different components of food labels and how to read food label   Heart Parts/Heart Disease and PAD -Discuss the anatomy of the heart,  the pathway of blood circulation through the heart, and these are affected by heart disease.   Stress I: Signs and Symptoms -Discuss the causes of stress, how stress may lead to anxiety and depression, and ways to limit stress.   CARDIAC REHAB PHASE II EXERCISE from 09/08/2017 in [place]  Date  07/28/17  Educator  DJ  Instruction Review Code  2- Demonstrated Understanding      Stress II: Relaxation -Discuss different types of relaxation techniques to limit stress.   CARDIAC REHAB PHASE II EXERCISE from 09/08/2017 in [place]  Date  08/04/17  Educator  DC  Instruction Review Code  2- Demonstrated Understanding      Warning Signs of Stroke / TIA -Discuss definition of a stroke, what the signs and symptoms are of a stroke, and how to identify when someone is having stroke.   CARDIAC REHAB PHASE II EXERCISE from 09/08/2017 in [place]  Date  08/11/17  Educator  DC  Instruction Review Code  2- Demonstrated Understanding      Knowledge Questionnaire Score: Knowledge Questionnaire Score - 07/23/17 1421      Knowledge Questionnaire Score   Pre Score  18/24       Core Components/Risk Factors/Patient Goals at Admission: Personal Goals and Risk Factors at Admission - 07/23/17 1424      Core Components/Risk Factors/Patient Goals on Admission    Weight Management  Yes    Admit Weight  225 lb 1.6 oz (102.1 kg)    Goal Weight: Short Term  215 lb (97.5 kg)    Goal Weight: Long Term  205 lb (93 kg)    Expected Outcomes  Long Term: Adherence to nutrition and physical activity/exercise program aimed toward attainment of established weight goal;Weight Loss: Understanding of general recommendations for a balanced deficit meal plan, which promotes 1-2 lb weight loss per week and includes a negative energy balance of [phone] kcal/d;Weight Gain: Understanding of general recommendations for a high calorie, high protein  meal plan that promotes weight gain by distributing calorie intake throughout the day with the consumption for 4-5 meals, snacks, and/or supplements    Hypertension  Yes    Intervention  Provide education on lifestyle modifcations including regular physical activity/exercise, weight management, moderate sodium restriction and increased consumption of fresh fruit, vegetables, and low fat dairy, alcohol moderation, and smoking cessation.;Monitor prescription use compliance.    Expected Outcomes  Short Term: Continued assessment and intervention until BP is < 140/56mm HG in hypertensive participants. < 130/49mm HG in hypertensive participants with diabetes, heart failure or chronic kidney disease.;Long Term: Maintenance of blood pressure at goal levels.    Personal Goal Other  Yes    Personal Goal  Get stronger; Get back to work; Lose weight-get under 200 lbs.     Intervention  Patient will attend CR 3 days/week and supplement with exercise 2 days/week.     Expected Outcomes  Patient will meet his personal goals.        Core Components/Risk Factors/Patient Goals Review:  Goals and  Risk Factor Review    Row Name 08/23/17 1503 09/13/17 1344           Core Components/Risk Factors/Patient Goals Review   Personal Goals Review  Weight Management/Obesity;Hypertension;Other Get stronger; get back to work; lose down to 200 lbs.   Weight Management/Obesity;Hypertension;Other Get stronger; get back to work; lose down to 200 lbs.       Review  Patient has completed 11 sessions losing 1lb. He is doing well in the program with progression. He says he feels stronger and is able to more with his neices and nephews without getting tired. He has to complete the program before he can return to work. His cardiologist has not released him to return yet. His blood pressues tend to fluctuate with his initial readings still hypertensive. Will continue to monitor for progress.   Patient has completed 18 sessions gaining 4  lbs since last 30 day review. Patient is doing well in the program with progression. His cardiologist did release him to return to work and he passed the fitness test to return. He has returned to full time employment without difficulty. He says he feels stronger and better overall and is so very glad he is back at work. He plans to complete the progam. Will continue to monitor.       Expected Outcomes  Patient will continue to attend sessions and complete the program and meet his personal goals.   Patient will continue to attend sessions and complete the program and meet his personal goals.          Core Components/Risk Factors/Patient Goals at Discharge (Final Review):  Goals and Risk Factor Review - 09/13/17 1344      Core Components/Risk Factors/Patient Goals Review   Personal Goals Review  Weight Management/Obesity;Hypertension;Other Get stronger; get back to work; lose down to 200 lbs.     Review  Patient has completed 18 sessions gaining 4 lbs since last 30 day review. Patient is doing well in the program with progression. His cardiologist did release him to return to work and he passed the fitness test to return. He has returned to full time employment without difficulty. He says he feels stronger and better overall and is so very glad he is back at work. He plans to complete the progam. Will continue to monitor.     Expected Outcomes  Patient will continue to attend sessions and complete the program and meet his personal goals.        ITP Comments: ITP Comments    Row Name 07/26/17 1058 09/30/17 1454         ITP Comments  Patient new to program completing 2 sessions. Will monitor for progress.   Patient stopped attending after completing 18 sessions. Letter of discharge mailed to patient. MD will be notified.          Comments: Patient stopped coming to Cardiac Rehab on 09/13/17 after completing 18 sessions. Called patient to remind them of the Cardiac Rehab policy that if they do  not call or come for 3 consecutive visits that they would be discharged from the CR program. Doctor will be informed.

## 2017-10-01 ENCOUNTER — Encounter (HOSPITAL_COMMUNITY): Payer: BLUE CROSS/BLUE SHIELD

## 2017-10-04 ENCOUNTER — Encounter (HOSPITAL_COMMUNITY): Payer: Self-pay | Admitting: Internal Medicine

## 2017-10-04 ENCOUNTER — Ambulatory Visit (HOSPITAL_COMMUNITY)
Admission: RE | Admit: 2017-10-04 | Discharge: 2017-10-04 | Disposition: A | Discharge status: Home / Self Care | Payer: BLUE CROSS/BLUE SHIELD | Source: Ambulatory Visit | Attending: Internal Medicine | Admitting: Internal Medicine

## 2017-10-04 ENCOUNTER — Encounter (HOSPITAL_COMMUNITY): Payer: BLUE CROSS/BLUE SHIELD

## 2017-10-04 VITALS — BP 130/84 | HR 74 | Wt 227.8 lb

## 2017-10-04 DIAGNOSIS — I5022 Chronic systolic (congestive) heart failure: Secondary | ICD-10-CM | POA: Insufficient documentation

## 2017-10-04 DIAGNOSIS — I351 Nonrheumatic aortic (valve) insufficiency: Secondary | ICD-10-CM | POA: Diagnosis not present

## 2017-10-04 DIAGNOSIS — I340 Nonrheumatic mitral (valve) insufficiency: Secondary | ICD-10-CM | POA: Diagnosis not present

## 2017-10-04 DIAGNOSIS — Z79899 Other long term (current) drug therapy: Secondary | ICD-10-CM | POA: Insufficient documentation

## 2017-10-04 DIAGNOSIS — I130 Hypertensive heart and chronic kidney disease with heart failure and stage 1 through stage 4 chronic kidney disease, or unspecified chronic kidney disease: Secondary | ICD-10-CM | POA: Insufficient documentation

## 2017-10-04 DIAGNOSIS — N183 Chronic kidney disease, stage 3 (moderate): Secondary | ICD-10-CM | POA: Diagnosis not present

## 2017-10-04 DIAGNOSIS — I5042 Chronic combined systolic (congestive) and diastolic (congestive) heart failure: Secondary | ICD-10-CM | POA: Diagnosis not present

## 2017-10-04 DIAGNOSIS — F419 Anxiety disorder, unspecified: Secondary | ICD-10-CM | POA: Diagnosis not present

## 2017-10-04 DIAGNOSIS — Z8679 Personal history of other diseases of the circulatory system: Secondary | ICD-10-CM

## 2017-10-04 DIAGNOSIS — Z9889 Other specified postprocedural states: Secondary | ICD-10-CM | POA: Diagnosis not present

## 2017-10-04 DIAGNOSIS — R42 Dizziness and giddiness: Secondary | ICD-10-CM | POA: Insufficient documentation

## 2017-10-04 DIAGNOSIS — I428 Other cardiomyopathies: Secondary | ICD-10-CM | POA: Insufficient documentation

## 2017-10-04 DIAGNOSIS — Z9114 Patient's other noncompliance with medication regimen: Secondary | ICD-10-CM | POA: Diagnosis not present

## 2017-10-04 NOTE — Progress Notes (Signed)
Advanced Heart Failure Clinic Note   Patient ID: [name] [name], male   DOB: 06-18-1981, 37 y.o.   MRN: [id number]  Primary PCP: [name]  Primary Cardiologist: Dr. Haroldine [name]  HPI: [name] [name] is a 37 y.o.male with systolic HF due to  NICM, hypertension and CKD stage II-III (baseline Cr 1.5-1.7).   He was first diagnosed with HF in 2012 and his EF recovered and was instructed to stop taking carvedilol, spironolactone, and lasix by previous cardiologist. Admitted to [name] 12/31/28 for acute systolic heart failure. 12/31/11 ProBNP 3777. HIV nonreactive. Thyroid panel normal. Renal ultrasound was negative for obstruction. EF 20%. Discharge weight 193 lbs. Cath with normal coronaries.  Admitted 05/30/2015 with HTN urgency with troponin elevation likely consistent with myocardial strain/demand ischemia in the setting of medication non compliance, having run out 2-3 weeks ago and not refilling them. Placed back on his coreg, hydralazine, and spiro and was symptomatically stable on discharge. No ACE/ARB with CKD and does not tolerate nitrates with headaches. Discharge weight 193 lbs.  Underwent TEE in 9/18 with severe AI. EF 40-45%. Cath with normal coronaries. S/P AV repair 05/11/2017 with Dr. Roxy [name].  PYP scan negative 10/18  Today he returns for HF follow up. Saw Dr. Roxy [name] in f/u in 12/18. Echo at that time showed EF 45-50% with moderate recurrent AI. Says he feels ok. Back to working full time at [name]. No CP or SOB. No edema or PND. PCP is treating for vertigo. Taking meclizine. Has consult with Neuro in June. `No other neurologic symptoms.   ECHO 06/13/13: EF 20-25% ECHO 09/26/2014: EF 40%  ECHO 05/31/15 EF 20-25% Echo 11/17 EF 45-50% ECHO 03/2017 EF ~45% Severe AI  ECHO 06/23/2017: EF 45-50% . Grade II DD Left ventricle: The cavity size was normal. Systolic function was   mildly reduced. The estimated ejection fraction was in the range   of 45% to 50%. Diffuse hypokinesis. Features  are consistent with   a pseudonormal left ventricular filling pattern, with concomitant   abnormal relaxation and increased filling pressure (grade 2   diastolic dysfunction). Moderate to severe concentric left   ventricular hypertrophy. - Aortic valve: S/p aortic valve repair ([name] aortic   ring annuloplasty). There was moderate regurgitation, best seen   on apical 3 and 4 chamber views. Valve area (VTI): 1.24 cm^2.   Valve area (Vmax): 1.22 cm^2. Valve area (Vmean): 1.33 cm^2. - Aorta: Mild to moderate aortic root dilatation. - Right ventricle: Systolic function was moderately reduced.    Review of systems complete and found to be negative unless listed in HPI.    Past Medical History:  Diagnosis Date  . Anxiety   . CHF (congestive heart failure) ([name])   . Chronic systolic heart failure ([name])   . CKD (chronic kidney disease) stage 2, GFR 60-89 ml/min   . Dyspnea    with value issues- "if I dont take my medication"  . Essential hypertension, benign   . Headache   . History of pneumonia   . Mitral regurgitation    Moderate  . Noncompliance   . Nonischemic cardiomyopathy ([name] [name])    Normal coronaries May 2012, LVEF < 20%  . Pneumonia 2011  . S/P aortic valve repair 05/11/2017   Complex valvuloplasty including plication of left coronary leaflet and 51mm [name] ring annuloplasty    Current Outpatient Medications  Medication Sig Dispense Refill  . acetaminophen (TYLENOL) 500 MG tablet Take 1,000 mg by mouth every 6 (six) hours as needed  for moderate pain or headache.     . carvedilol (COREG) 25 MG tablet Take 1 tablet (25 mg total) by mouth 2 (two) times daily with a meal. 60 tablet 6  . ENTRESTO 97-103 MG TAKE 1 TABLET BY MOUTH TWICE DAILY 60 tablet 3  . furosemide (LASIX) 40 MG tablet Take 1 tablet (40 mg total) 2 (two) times daily by mouth. (Patient taking differently: Take 40 mg by mouth daily. ) 60 tablet 1  . hydrALAZINE (APRESOLINE) 25 MG tablet Take 1  tablet (25 mg total) by mouth 3 (three) times daily. 90 tablet 3  . meclizine (ANTIVERT) 25 MG tablet Take 1 tablet (25 mg total) by mouth 3 (three) times daily as needed for dizziness. 30 tablet 0   No current facility-administered medications for this encounter.     Vitals:   10/04/17 1025  BP: 130/84  Pulse: 74  SpO2: 97%  Weight: 227 lb 12.8 oz (103.3 kg)   Wt Readings from Last 3 Encounters:  10/04/17 227 lb 12.8 oz (103.3 kg)  09/01/17 222 lb (100.7 kg)  07/30/17 225 lb (102.1 kg)    PHYSICAL EXAM: General:  Well appearing. No resp difficulty HEENT: normal Neck: supple. no JVD. Carotids 2+ bilat; no bruits. No lymphadenopathy or thryomegaly appreciated. Cor: PMI nondisplaced. Regular rate & rhythm. No rubs, gallops or murmurs. Lungs: clear Abdomen: soft, nontender, nondistended. No hepatosplenomegaly. No bruits or masses. Good bowel sounds. Extremities: no cyanosis, clubbing, rash, edema Neuro: alert & orientedx3, cranial nerves grossly intact. moves all 4 extremities w/o difficulty. Affect pleasant   ASSESSMENT & PLAN:  1) Chronic systolic HF: NICM,  ECHO ~10% in 2013. EF 45-50% in 2016. Suspect due to ongoing AI +/- HTN - s/p AoV repair 10/18. F/u echo 12/18 with EF 45-50% moderate AI - Doing well. NYHA I. I did bedside echo in clinic today personally and AI appears mild on my limited images  - PYP scan negative for amyloid.  - On goal dose carvedilol and entresto.  - Continue 25 mg mg hydralazine three times a day (had HAs with Imdur in past) 2) HTN :  - Blood pressure well controlled. Continue current regimen.  3) CKD stage III:  (baseline Cr 1.5-1.7)  - Stable recently.  4) S/P AVR 05/01/2017 - Has finished CR. - Bedside echo today just mild AI - Will see back in 3 months with full echo  5) Vertigo - Continue meclizine. Refer for vestibular training .  Glori Bickers, MD  10:58 AM

## 2017-10-04 NOTE — Patient Instructions (Signed)
You have been referred to Vestibular Rehab  Your physician recommends that you schedule a follow-up appointment in: 3 months with echocardiogram

## 2017-10-06 ENCOUNTER — Encounter (HOSPITAL_COMMUNITY): Payer: BLUE CROSS/BLUE SHIELD

## 2017-10-08 ENCOUNTER — Encounter (HOSPITAL_COMMUNITY): Payer: BLUE CROSS/BLUE SHIELD

## 2017-10-11 ENCOUNTER — Encounter (HOSPITAL_COMMUNITY): Payer: BLUE CROSS/BLUE SHIELD

## 2017-10-13 ENCOUNTER — Encounter (HOSPITAL_COMMUNITY): Payer: BLUE CROSS/BLUE SHIELD

## 2017-10-15 ENCOUNTER — Encounter (HOSPITAL_COMMUNITY): Payer: BLUE CROSS/BLUE SHIELD

## 2017-11-01 ENCOUNTER — Encounter (HOSPITAL_COMMUNITY): Payer: Self-pay | Admitting: *Deleted

## 2017-11-01 NOTE — Progress Notes (Signed)
Received FMLA paperwork from patient on 10/13/2017.  Forms completed/signed and faxed today to [phone].  Patient is aware.  Original forms will be scanned to patient's electronic medical record.

## 2017-11-02 ENCOUNTER — Encounter: Payer: Self-pay | Admitting: Physical Therapy

## 2017-11-02 ENCOUNTER — Ambulatory Visit: Payer: BLUE CROSS/BLUE SHIELD | Attending: Internal Medicine | Admitting: Physical Therapy

## 2017-11-02 ENCOUNTER — Other Ambulatory Visit: Payer: Self-pay

## 2017-11-02 DIAGNOSIS — R42 Dizziness and giddiness: Secondary | ICD-10-CM

## 2017-11-02 NOTE — Therapy (Signed)
[facility] [address] [place], [place], [address] Phone: [phone]   Fax:  [phone]  Physical Therapy Evaluation Vestibular Evaluation and Discharge  Patient Details  Name: [name] [name] MRN: [id number] Date of Birth: [birth year] Referring Provider: Jolaine Artist, MD   Encounter Date: 11/02/2017  PT End of Session - 11/02/17 1911    Visit Number  1    Number of Visits  1    Authorization Type  [employer]    PT Start Time  5631    PT Stop Time  1100    PT Time Calculation (min)  45 min    Activity Tolerance  Patient tolerated treatment well    Behavior During Therapy  [facility] for tasks assessed/performed       Past Medical History:  Diagnosis Date  . Anxiety   . CHF (congestive heart failure) ([place])   . Chronic systolic heart failure ([place])   . CKD (chronic kidney disease) stage 2, GFR 60-89 ml/min   . Dyspnea    with value issues- "if I dont take my medication"  . Essential hypertension, benign   . Headache   . History of pneumonia   . Mitral regurgitation    Moderate  . Noncompliance   . Nonischemic cardiomyopathy ([place])    Normal coronaries May 2012, LVEF < 20%  . Pneumonia 2011  . S/P aortic valve repair 05/11/2017   Complex valvuloplasty including plication of left coronary leaflet and 48mm Biostable HAART ring annuloplasty    Past Surgical History:  Procedure Laterality Date  . AORTIC VALVE REPAIR N/A 05/11/2017   Procedure: AORTIC VALVE REPAIR;  Surgeon: Rexene Alberts, MD;  Location: [facility];  Service: Open Heart Surgery;  Laterality: N/A;  . RIGHT/LEFT HEART CATH AND CORONARY ANGIOGRAPHY N/A 04/20/2017   Procedure: RIGHT/LEFT HEART CATH AND CORONARY ANGIOGRAPHY;  Surgeon: Jolaine Artist, MD;  Location: [facility];  Service: Cardiovascular;  Laterality: N/A;  . SURGERY SCROTAL / TESTICULAR     Testicular torsion  . TEE WITHOUT CARDIOVERSION N/A 04/09/2017   Procedure:  TRANSESOPHAGEAL ECHOCARDIOGRAM (TEE);  Surgeon: Jolaine Artist, MD;  Location: [facility] ENDOSCOPY;  Service: Cardiovascular;  Laterality: N/A;  . TEE WITHOUT CARDIOVERSION N/A 05/11/2017   Procedure: TRANSESOPHAGEAL ECHOCARDIOGRAM (TEE);  Surgeon: Rexene Alberts, MD;  Location: [facility];  Service: Open Heart Surgery;  Laterality: N/A;    There were no vitals filed for this visit.   Subjective Assessment - 11/02/17 1015    Subjective  For the last year I've been having issues with double vision, dizziness, lightheaded, nausea and off balance (when having vertigo--otherwise balance is normal). When the vertigo is bad, the only thing that helps is to be still for a while. Usually take the meclizine when bad episode, but not sure it helps any.     Pertinent History  NICM EF 40%, HTN, CKD, AVR, h/o headaches    Currently in Pain?  No/denies         [facility] PT Assessment - 11/02/17 1012      Assessment   Medical Diagnosis  vertigo    Referring Provider  Bensimhon, Shaune Pascal, MD    Onset Date/Surgical Date  -- referred 10/04/17; pt reports onset > 1 year ago    Prior Therapy  taken to ED from Cardiac Rehab and MD prescribed meclizine; taking it when       Precautions   Precautions  None      Balance  Screen   Has the patient fallen in the past 6 months  No near falls    Has the patient had a decrease in activity level because of a fear of falling?   No    Is the patient reluctant to leave their home because of a fear of falling?   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 [place]   Overall Cognitive Status   Within Functional Limits for tasks assessed      Observation/Other Assessments   Observations  ambulates without signs of imbalance    Focus on Therapeutic Outcomes (FOTO)   FS 34 (risk adjusted 72) indicating patient views his condition far worse than FOTO adjusted score      Coordination   Gross Motor Movements are Fluid and Coordinated  Yes    Fine Motor Movements are Fluid and Coordinated  Yes           Vestibular Assessment - 11/02/17 1024      Vestibular Assessment   General Observation  ambulates without imbalance      Symptom Behavior   Type of Dizziness  Lightheadedness room spins; double vision    Frequency of Dizziness  at first maybe every other week; now every other day    Duration of Dizziness  10 minutes; after nausea is drained for 1-2 hours    Aggravating Factors  Driving homework online;     Relieving Factors  Lying supine;Head stationary      Occulomotor Exam   Occulomotor Alignment  Normal rt lower lid lower    Spontaneous  Absent    Gaze-induced  Absent    Smooth Pursuits  Intact    Saccades  Dysmetria rt eye overshoots; frog hops back to target    Comment  convergence WNL      Vestibulo-Occular Reflex   VOR Cancellation  Normal    Comment  HIT normal; test of skew negaitive      Visual Acuity   Static  10    Dynamic  10      Auditory   Comments  denies hearing changes; denies loud sounds triggering dizziness, reports ringing in ears "as long as I can remember" high pitched for seconds in either ear;       Other Tests   Tragal  normal     Hyperventilation  normal      Cognition   Cognition Orientation Level  Oriented x 4      Positional Sensitivities   Sit to Supine  No dizziness    Supine to Left Side  No dizziness    Supine to Right Side  No dizziness    Supine to Sitting  No dizziness    Right Hallpike  No dizziness    Up from Right Hallpike  No dizziness    Up from Left Hallpike  No dizziness    Nose to Right Knee  No dizziness     Right Knee to Sitting  No dizziness    Nose to Left Knee  No dizziness    Left Knee to Sitting  No dizziness    Head Turning x 5  No dizziness    Head Nodding x 5  No dizziness    Pivot Right in Standing  No dizziness    Pivot Left in Standing  No dizziness    Rolling Right  No dizziness    Rolling Left  No dizziness    Positional Sensitivities Comments  unable to elicit symptoms with positional changes          Objective measurements completed on examination: See above findings.              PT Education - 11/02/17 1909    Education provided  Yes    Education Details  results of assessment not indicative of vestibular cause for dizziness (all tests negative); ?migraine related dizziness (endorses he has a lot of stress); recommend follow-up with MD and ?obtain referral to neurologist specializing in headaches    Person(s) Educated  Patient    Methods  Explanation    Comprehension  Verbalized understanding                  Plan - 11/02/17 1911    Clinical Impression Statement  Patient referred for vesttibular evaluation due to >1 year history of dizziness (lightheaded, room spins, double vision, nausea). Vestibular evaluation was negative for vestibular etiology of symptoms (either central or peripheral causes). Unable to elicit symptoms during assessment (pt did endorse mild nausea at the conclusion of session and feeling that "spinning was going to start, but I stopped it."). Patient reports history of severe headaches (especially when he was younger) and states he often gets a headache after the vertigo and nausea episodes. Denies onset of headache prior to other symptoms. No further vestibular rehab indicated at this time. Referred patient back to his physician with recommendation for neurology consult to assess potential migraines causing dizziness. Patient expressed understanding of the plan and will follow-up with his physician.     Clinical Presentation   Stable    Clinical Presentation due to:  unable to elicit dizziness, vertigo or diplopia    Clinical Decision Making  Low    PT Frequency  One time visit    Recommended Other Services  neurology consult for ?migraine related vertigo    Consulted and Agree with Plan of Care  Patient       Patient will benefit from skilled therapeutic intervention in order to improve the following deficits and impairments:     Visit Diagnosis: Dizziness and giddiness - Plan: PT plan of care cert/re-cert     Problem List Patient Active Problem List   Diagnosis Date Noted  . S/P aortic valve repair 05/11/2017  . Aortic valve regurgitation   . Chronic systolic CHF (congestive heart failure) ([place])   . Hypertensive emergency 05/30/2015  . Essential hypertension 11/22/2014  . Shift work sleep disorder 10/30/2014  . Obstructive sleep apnea 10/30/2014  . Hypertensive crisis 09/26/2014  . Hypertensive urgency 06/13/2013  . CHF (congestive heart failure) ([place]) 06/13/2013  . CKD (chronic kidney disease) stage 3, GFR 30-59 ml/min ([facility]) 06/13/2013  . Nonischemic cardiomyopathy ([place]) 06/13/2013  . HTN (hypertension) 02/25/2012  . Chronic combined systolic and diastolic heart failure ([place]) 01/08/2012  . Elevated troponin 01/01/2012  . Hypokalemia 12/31/2011  . Chest pain 12/31/2011    Rexanne Mano, PT 11/02/2017, 7:22 PM  [facility] [address] [place], [place], [address] Phone:  [phone]   Fax:  [phone]  Name: JKWON TREPTOW MRN: [id number] Date of Birth: 04/14/1981

## 2017-11-05 ENCOUNTER — Encounter: Payer: Self-pay | Admitting: Neurology

## 2017-11-26 ENCOUNTER — Other Ambulatory Visit: Payer: Self-pay | Admitting: Physician Assistant

## 2017-11-26 ENCOUNTER — Other Ambulatory Visit (HOSPITAL_COMMUNITY): Payer: Self-pay | Admitting: Internal Medicine

## 2017-12-03 ENCOUNTER — Other Ambulatory Visit (HOSPITAL_COMMUNITY): Payer: Self-pay | Admitting: Student

## 2017-12-03 ENCOUNTER — Other Ambulatory Visit: Payer: Self-pay | Admitting: Physician Assistant

## 2017-12-23 ENCOUNTER — Other Ambulatory Visit (HOSPITAL_COMMUNITY): Payer: Self-pay | Admitting: Internal Medicine

## 2017-12-23 MED ORDER — CARVEDILOL 25 MG PO TABS: 25.0000 mg | ORAL_TABLET | Freq: Two times a day (BID) | ORAL | 6 refills | Status: DC

## 2018-01-04 ENCOUNTER — Other Ambulatory Visit (HOSPITAL_COMMUNITY): Payer: Self-pay | Admitting: Internal Medicine

## 2018-01-05 ENCOUNTER — Ambulatory Visit (HOSPITAL_BASED_OUTPATIENT_CLINIC_OR_DEPARTMENT_OTHER)
Admission: RE | Admit: 2018-01-05 | Discharge: 2018-01-05 | Disposition: A | Discharge status: Home / Self Care | Payer: BLUE CROSS/BLUE SHIELD | Source: Ambulatory Visit | Attending: Internal Medicine | Admitting: Internal Medicine

## 2018-01-05 ENCOUNTER — Ambulatory Visit (HOSPITAL_COMMUNITY)
Admission: RE | Admit: 2018-01-05 | Discharge: 2018-01-05 | Disposition: A | Discharge status: Home / Self Care | Payer: BLUE CROSS/BLUE SHIELD | Source: Ambulatory Visit | Attending: Family Medicine | Admitting: Family Medicine

## 2018-01-05 VITALS — BP 127/74 | HR 69 | Wt 227.0 lb

## 2018-01-05 DIAGNOSIS — Z9114 Patient's other noncompliance with medication regimen: Secondary | ICD-10-CM | POA: Diagnosis not present

## 2018-01-05 DIAGNOSIS — F419 Anxiety disorder, unspecified: Secondary | ICD-10-CM | POA: Insufficient documentation

## 2018-01-05 DIAGNOSIS — I340 Nonrheumatic mitral (valve) insufficiency: Secondary | ICD-10-CM | POA: Diagnosis not present

## 2018-01-05 DIAGNOSIS — N183 Chronic kidney disease, stage 3 (moderate): Secondary | ICD-10-CM | POA: Diagnosis not present

## 2018-01-05 DIAGNOSIS — I5042 Chronic combined systolic (congestive) and diastolic (congestive) heart failure: Secondary | ICD-10-CM | POA: Diagnosis not present

## 2018-01-05 DIAGNOSIS — I351 Nonrheumatic aortic (valve) insufficiency: Secondary | ICD-10-CM | POA: Diagnosis not present

## 2018-01-05 DIAGNOSIS — I130 Hypertensive heart and chronic kidney disease with heart failure and stage 1 through stage 4 chronic kidney disease, or unspecified chronic kidney disease: Secondary | ICD-10-CM | POA: Diagnosis not present

## 2018-01-05 DIAGNOSIS — Z8679 Personal history of other diseases of the circulatory system: Secondary | ICD-10-CM

## 2018-01-05 DIAGNOSIS — Z79899 Other long term (current) drug therapy: Secondary | ICD-10-CM | POA: Insufficient documentation

## 2018-01-05 DIAGNOSIS — Z9889 Other specified postprocedural states: Secondary | ICD-10-CM

## 2018-01-05 DIAGNOSIS — I429 Cardiomyopathy, unspecified: Secondary | ICD-10-CM | POA: Insufficient documentation

## 2018-01-05 DIAGNOSIS — I5022 Chronic systolic (congestive) heart failure: Secondary | ICD-10-CM | POA: Insufficient documentation

## 2018-01-05 DIAGNOSIS — I509 Heart failure, unspecified: Secondary | ICD-10-CM | POA: Diagnosis not present

## 2018-01-05 LAB — BASIC METABOLIC PANEL
ANION GAP: 6 (ref 5–15)
BUN: 16 mg/dL (ref 6–20)
CALCIUM: 8.9 mg/dL (ref 8.9–10.3)
CO2: 25 mmol/L (ref 22–32)
Chloride: 105 mmol/L (ref 98–111)
Creatinine, Ser: 1.58 mg/dL — ABNORMAL HIGH (ref 0.61–1.24)
GFR, EST NON AFRICAN AMERICAN: 55 mL/min — AB (ref >60)
GLUCOSE: 100 mg/dL — AB (ref 70–99)
POTASSIUM: 3.8 mmol/L (ref 3.5–5.1)
SODIUM: 136 mmol/L (ref 135–145)

## 2018-01-05 LAB — BRAIN NATRIURETIC PEPTIDE: B NATRIURETIC PEPTIDE 5: 337.1 pg/mL — AB (ref 0.0–100.0)

## 2018-01-05 MED ORDER — HYDRALAZINE HCL 25 MG PO TABS: 25.0000 mg | ORAL_TABLET | Freq: Three times a day (TID) | ORAL | 3 refills | Status: DC

## 2018-01-05 MED ORDER — SPIRONOLACTONE 25 MG PO TABS: 12.5000 mg | ORAL_TABLET | Freq: Every day | ORAL | 6 refills | Status: DC

## 2018-01-05 NOTE — Progress Notes (Signed)
Advanced Heart Failure Clinic Note   Patient ID: [name] [name], male   DOB: 1980-10-26, 37 y.o.   MRN: [id number]  Primary PCP: [place]  Primary Cardiologist: Dr. Haroldine Laws  HPI: [name] [name] is a 37 y.o.male with systolic HF due to  NICM, hypertension and CKD stage II-III (baseline Cr 1.5-1.7).   He was first diagnosed with HF in 2012 and his EF recovered and was instructed to stop taking carvedilol, spironolactone, and lasix by previous cardiologist. Admitted to [facility] 0/93/23 for acute systolic heart failure. 12/31/11 ProBNP 3777. HIV nonreactive. Thyroid panel normal. Renal ultrasound was negative for obstruction. EF 20%. Discharge weight 193 lbs. Cath with normal coronaries.  Admitted 05/30/2015 with HTN urgency with troponin elevation likely consistent with myocardial strain/demand ischemia in the setting of medication non compliance, having run out 2-3 weeks ago and not refilling them. Placed back on his coreg, hydralazine, and spiro and was symptomatically stable on discharge. No ACE/ARB with CKD and does not tolerate nitrates with headaches. Discharge weight 193 lbs.  Underwent TEE in 9/18 with severe AI. EF 40-45%. Cath with normal coronaries. S/P AV repair 05/11/2017 with Dr. Roxy Manns.  PYP scan negative 10/18  Today he returns for HF follow up. Complaining of lots of stress. Says he had palpitations when he ran out carvedilol for 2-3 days.   Overall feeling fine but is finding it harder to do his activities. Denies SOB/PND/Orthopnea. Mild dyspnea with steps. SOB when lifting 70-90 pounds at work. Feels it is a struggle. Appetite ok. No fever or chills. Weight at home pounds 225-227 pounds. Taking all medications. Working at [name] full time. He is required to do lots of heavy lifting.   ECHO 06/13/13: EF 20-25% ECHO 09/26/2014: EF 40%  ECHO 05/31/15 EF 20-25% Echo 11/17 EF 45-50% ECHO 03/2017 EF ~45% Severe AI  ECHO 06/23/2017: EF 45-50% . Grade II DD Left ventricle: The  cavity size was normal. Systolic function was   mildly reduced. The estimated ejection fraction was in the range   of 45% to 50%. Diffuse hypokinesis. Features are consistent with   a pseudonormal left ventricular filling pattern, with concomitant   abnormal relaxation and increased filling pressure (grade 2   diastolic dysfunction). Moderate to severe concentric left   ventricular hypertrophy. - Aortic valve: S/p aortic valve repair (Biostable HAART aortic   ring annuloplasty). There was moderate regurgitation, best seen   on apical 3 and 4 chamber views. Valve area (VTI): 1.24 cm^2.   Valve area (Vmax): 1.22 cm^2. Valve area (Vmean): 1.33 cm^2. - Aorta: Mild to moderate aortic root dilatation. - Right ventricle: Systolic function was moderately reduced.  Review of systems complete and found to be negative unless listed in HPI.   Past Medical History:  Diagnosis Date  . Anxiety   . CHF (congestive heart failure) ([place])   . Chronic systolic heart failure ([place])   . CKD (chronic kidney disease) stage 2, GFR 60-89 ml/min   . Dyspnea    with value issues- "if I dont take my medication"  . Essential hypertension, benign   . Headache   . History of pneumonia   . Mitral regurgitation    Moderate  . Noncompliance   . Nonischemic cardiomyopathy ([place])    Normal coronaries May 2012, LVEF < 20%  . Pneumonia 2011  . S/P aortic valve repair 05/11/2017   Complex valvuloplasty including plication of left coronary leaflet and 57mm Biostable HAART ring annuloplasty    Current Outpatient Medications  Medication  Sig Dispense Refill  . acetaminophen (TYLENOL) 500 MG tablet Take 1,000 mg by mouth every 6 (six) hours as needed for moderate pain or headache.     . carvedilol (COREG) 25 MG tablet Take 1 tablet (25 mg total) by mouth 2 (two) times daily with a meal. 60 tablet 6  . ENTRESTO 97-103 MG TAKE 1 TABLET BY MOUTH TWICE DAILY 60 tablet 3  . furosemide (LASIX) 40 MG tablet Take 1 tablet (40 mg  total) 2 (two) times daily by mouth. (Patient taking differently: Take 40 mg by mouth daily. ) 60 tablet 1  . hydrALAZINE (APRESOLINE) 25 MG tablet TAKE 1 TABLET BY MOUTH THREE TIMES DAILY 90 tablet 3  . meclizine (ANTIVERT) 25 MG tablet Take 1 tablet (25 mg total) by mouth 3 (three) times daily as needed for dizziness. 30 tablet 0   No current facility-administered medications for this encounter.     Vitals:   01/05/18 1103  BP: 127/74  Pulse: 69  SpO2: 99%  Weight: 227 lb (103 kg)   Wt Readings from Last 3 Encounters:  01/05/18 227 lb (103 kg)  10/04/17 227 lb 12.8 oz (103.3 kg)  09/01/17 222 lb (100.7 kg)    PHYSICAL EXAM: General:  Well appearing. No resp difficulty HEENT: normal Neck: supple. no JVD. Carotids 2+ bilat; no bruits. No lymphadenopathy or thryomegaly appreciated. Cor: PMI nondisplaced. Regular rate & rhythm. 2/6 AS or 2/6 AI Lungs: clear Abdomen: soft, nontender, nondistended. No hepatosplenomegaly. No bruits or masses. Good bowel sounds. Extremities: no cyanosis, clubbing, rash, edema Neuro: alert & orientedx3, cranial nerves grossly intact. moves all 4 extremities w/o difficulty. Affect pleasant  ASSESSMENT & PLAN: 1) Chronic systolic HF: NICM,  ECHO ~60% in 2013. EF 45-50% in 2016. Suspect due to ongoing AI +/- HTN - s/p AoV repair 10/18. F/u echo 12/18 with EF 45-50% moderate AI - ECHO today reviewed and discussed by Dr Haroldine Laws.  EF 30-35% Mod AI.  - NYHA II. Volume status ok.  - PYP scan negative for amyloid.  - Continue coreg 25 mg BID - Continue Entresto 97/103 mg BID - Continue 25 mg mg hydralazine three times a day (had HAs with Imdur in past) Add 12.5 mg spironolactone daily  - Refer to EP  2) HTN :  -stable.  3) CKD stage III:  (baseline Cr 1.5-1.7)  - BMET today 4) S/P AV repair 05/01/2017 - Has finished CR. - moderate AI on ECHO. Unchanged    Refer to EP for ICD.   Darrick Grinder NP-C  11:48 AM   Patient seen and examined with Darrick Grinder, NP. We discussed all aspects of the encounter. I agree with the assessment and plan as stated above.   Echo reviewed personally EF 30-35%. Moderately dilated. Moderate AI. Mild MR. Functional status worse. NYHA II. May not be able to keep up with his job much longer. Will refer for ICD. Volume status ok. AI is moderate but unchanged. Will continue to follow.   Glori Bickers, MD  12:19 PM

## 2018-01-05 NOTE — Progress Notes (Signed)
  Echocardiogram 2D Echocardiogram has been performed.  [name] [name] 01/05/2018, 10:53 AM

## 2018-01-05 NOTE — Patient Instructions (Signed)
Start Spironolactone 12.5 mg (1/2 tab) daily  Labs today  Labs in 1 week at [facility] have been referred to EP to discuss getting a defibrillator  Your physician recommends that you schedule a follow-up appointment in: 8 weeks

## 2018-01-10 ENCOUNTER — Ambulatory Visit: Payer: BLUE CROSS/BLUE SHIELD | Admitting: Neurology

## 2018-01-10 ENCOUNTER — Encounter: Payer: Self-pay | Admitting: Neurology

## 2018-01-10 VITALS — BP 146/80 | HR 70 | Ht 70.0 in | Wt 226.0 lb

## 2018-01-10 DIAGNOSIS — G43109 Migraine with aura, not intractable, without status migrainosus: Secondary | ICD-10-CM | POA: Diagnosis not present

## 2018-01-10 NOTE — Patient Instructions (Signed)
1.  You may take Tylenol and meclizine for now.  I will contact your cardiologist regarding which anti-nausea medication would be okay to take.  Limit use of Tylenol to no more than 2 days out of week to prevent rebound headache 2.  I will contact your cardiologist about which daily migraine preventative to take 3.  Keep headache diary 4.  Drink plenty of water. 5.  Try to increase exercise 6.  Follow up in 3 months.

## 2018-01-10 NOTE — Progress Notes (Addendum)
NEUROLOGY CONSULTATION NOTE  [name] [name] MRN: [id number] DOB: [birth year]  Referring provider: Dr. Hilma Favors Primary care provider: Dr. Hilma Favors  Reason for consult:  dizziness  HISTORY OF PRESENT ILLNESS: [name] [name] is a 37 year old right-handed male with systolic heart failure, hypertension, s/p aortic valve repair and CKD stage II-III who presents for dizziness.  History supplemented by referring provider's note  He began experiencing episodes of dizziness in early 2018.  He describes a severe spinning sensation blurred/double vision and severe nausea that can last a couple of minutes.  Sometimes he has associated bifrontal headaches, usually mild non-throbbing and occasionally severe throbbing, which lasts 1 to 2 hours with Tylenol.  There is no associated vomiting, unilateral numbness or weakness.  It occurs about 4 times a week.  There are no specific aggravating factors.  Sleep helps relieve it.  He treats it with Tylenol, meclizine and sleep.  He was evaluated at vestibular rehab where no central or peripheral vestibulopathy was appreciated.    Current NSAIDS:  no Current analgesics:  Tylenol Current triptans:  no Current anti-emetic:  no Current muscle relaxants:  no Current anti-anxiolytic:  no Current sleep aide:  no Current Antihypertensive medications:  Coreg 25mg  twice daily, Lasix 40mg  daily, spironolactone 12.5mg  daily Current Antidepressant medications:  no Current Anticonvulsant medications:  no Current Vitamins/Herbal/Supplements:  no Current Antihistamines/Decongestants:  no Other therapy:  no  Past NSAIDS:  no Past analgesics:  no Past abortive triptans:  no Past muscle relaxants:  no Past anti-emetic:  no Other past therapies:  no  Caffeine:  Rarely Dr. Malachi Bonds Alcohol:  rarely Smoker:  no Diet:  Water.  Sprite Exercise:  no Depression:  no; Anxiety:  yes Sleep hygiene:  Poor He reports past history of severe headaches as a child and  adolescence. Family history of headache:  mom  01/05/18 BMP:  Na 136, K 3.8, glucose 100, BUN 16, Cr 1.58  PAST MEDICAL HISTORY: Past Medical History:  Diagnosis Date  . Anxiety   . CHF (congestive heart failure) ([place])   . Chronic systolic heart failure ([place])   . CKD (chronic kidney disease) stage 2, GFR 60-89 ml/min   . Dyspnea    with value issues- "if I dont take my medication"  . Essential hypertension, benign   . Headache   . History of pneumonia   . Mitral regurgitation    Moderate  . Noncompliance   . Nonischemic cardiomyopathy ([place])    Normal coronaries May 2012, LVEF < 20%  . Pneumonia 2011  . S/P aortic valve repair 05/11/2017   Complex valvuloplasty including plication of left coronary leaflet and 72mm Biostable HAART ring annuloplasty    PAST SURGICAL HISTORY: Past Surgical History:  Procedure Laterality Date  . AORTIC VALVE REPAIR N/A 05/11/2017   Procedure: AORTIC VALVE REPAIR;  Surgeon: Rexene Alberts, MD;  Location: [place];  Service: Open Heart Surgery;  Laterality: N/A;  . RIGHT/LEFT HEART CATH AND CORONARY ANGIOGRAPHY N/A 04/20/2017   Procedure: RIGHT/LEFT HEART CATH AND CORONARY ANGIOGRAPHY;  Surgeon: Jolaine Artist, MD;  Location: [place] [facility] LAB;  Service: Cardiovascular;  Laterality: N/A;  . SURGERY SCROTAL / TESTICULAR     Testicular torsion  . TEE WITHOUT CARDIOVERSION N/A 04/09/2017   Procedure: TRANSESOPHAGEAL ECHOCARDIOGRAM (TEE);  Surgeon: Jolaine Artist, MD;  Location: [facility] ENDOSCOPY;  Service: Cardiovascular;  Laterality: N/A;  . TEE WITHOUT CARDIOVERSION N/A 05/11/2017   Procedure: TRANSESOPHAGEAL ECHOCARDIOGRAM (TEE);  Surgeon: Rexene Alberts, MD;  Location: [facility] OR;  Service: Open Heart Surgery;  Laterality: N/A;    MEDICATIONS: Current Outpatient Medications on File Prior to Visit  Medication Sig Dispense Refill  . acetaminophen (TYLENOL) 500 MG tablet Take 1,000 mg by mouth every 6 (six) hours as needed for moderate pain or  headache.     . carvedilol (COREG) 25 MG tablet Take 1 tablet (25 mg total) by mouth 2 (two) times daily with a meal. 60 tablet 6  . ENTRESTO 97-103 MG TAKE 1 TABLET BY MOUTH TWICE DAILY 60 tablet 3  . furosemide (LASIX) 40 MG tablet Take 1 tablet (40 mg total) 2 (two) times daily by mouth. (Patient taking differently: Take 40 mg by mouth daily. ) 60 tablet 1  . hydrALAZINE (APRESOLINE) 25 MG tablet Take 1 tablet (25 mg total) by mouth 3 (three) times daily. 90 tablet 3  . meclizine (ANTIVERT) 25 MG tablet Take 1 tablet (25 mg total) by mouth 3 (three) times daily as needed for dizziness. 30 tablet 0  . spironolactone (ALDACTONE) 25 MG tablet Take 0.5 tablets (12.5 mg total) by mouth daily. 15 tablet 6   No current facility-administered medications on file prior to visit.     ALLERGIES: No Known Allergies  FAMILY HISTORY: Family History  Problem Relation Age of Onset  . Breast cancer Mother   . Stroke Father   . Hypertension Father     SOCIAL HISTORY: Social History   Socioeconomic History  . Marital status: Married    Spouse name: RaShannon  . Number of children: 0  . Years of education: Not on file  . Highest education level: Some college, no degree  Occupational History    Employer: [employer] Needs  . Financial resource strain: Not on file  . Food insecurity:    Worry: Not on file    Inability: Not on file  . Transportation needs:    Medical: Not on file    Non-medical: Not on file  Tobacco Use  . Smoking status: Never Smoker  . Smokeless tobacco: Never Used  Substance and Sexual Activity  . Alcohol use: Yes    Alcohol/week: 0.0 oz    Comment: , 2x month  . Drug use: No  . Sexual activity: Yes    Birth control/protection: Condom  Lifestyle  . Physical activity:    Days per week: Not on file    Minutes per session: Not on file  . Stress: Not on file  Relationships  . Social connections:    Talks on phone: Not on file    Gets together: Not  on file    Attends religious service: Not on file    Active member of club or organization: Not on file    Attends meetings of clubs or organizations: Not on file    Relationship status: Not on file  . Intimate partner violence:    Fear of current or ex partner: Not on file    Emotionally abused: Not on file    Physically abused: Not on file    Forced sexual activity: Not on file  Other Topics Concern  . Not on file  Social History Narrative   Occupation: just started sanitation job cleaning      Patient is right-handed. He lives with his wife in a 1 story house. He drinks 1-2 sodas a day.     REVIEW OF SYSTEMS: Constitutional: No fevers, chills, or sweats, no generalized fatigue, change in appetite Eyes: No visual changes, double  vision, eye pain Ear, nose and throat: No hearing loss, ear pain, nasal congestion, sore throat Cardiovascular: No chest pain, palpitations Respiratory:  No shortness of breath at rest or with exertion, wheezes GastrointestinaI: No nausea, vomiting, diarrhea, abdominal pain, fecal incontinence Genitourinary:  No dysuria, urinary retention or frequency Musculoskeletal:  No neck pain, back pain Integumentary: No rash, pruritus, skin lesions Neurological: as above Psychiatric: No depression, insomnia, anxiety Endocrine: No palpitations, fatigue, diaphoresis, mood swings, change in appetite, change in weight, increased thirst Hematologic/Lymphatic:  No purpura, petechiae. Allergic/Immunologic: no itchy/runny eyes, nasal congestion, recent allergic reactions, rashes  PHYSICAL EXAM: Vitals:   01/10/18 0833  BP: (!) 146/80  Pulse: 70  SpO2: 97%   General: No acute distress.  Patient appears well-groomed.  Head:  Normocephalic/atraumatic Eyes:  fundi examined but not visualized Neck: supple, no paraspinal tenderness, full range of motion Back: No paraspinal tenderness Heart: regular rate and rhythm Lungs: Clear to auscultation bilaterally. Vascular:  No carotid bruits. Neurological Exam: Mental status: alert and oriented to person, place, and time, recent and remote memory intact, fund of knowledge intact, attention and concentration intact, speech fluent and not dysarthric, language intact. Cranial nerves: CN I: not tested CN II: pupils equal, round and reactive to light, visual fields intact CN III, IV, VI:  full range of motion, no nystagmus, no ptosis CN V: facial sensation intact CN VII: upper and lower face symmetric CN VIII: hearing intact CN IX, X: gag intact, uvula midline CN XI: sternocleidomastoid and trapezius muscles intact CN XII: tongue midline Bulk & Tone: normal, no fasciculations. Motor:  5/5 throughout  Sensation:  temperature and vibration sensation intact. Deep Tendon Reflexes:  2+ throughout, toes downgoing.  Finger to nose testing:  Without dysmetria.  Heel to shin:  Without dysmetria.  Gait:  Normal station and stride.  Able to turn and tandem walk. Romberg negative.  IMPRESSION: Probable migraine with aura, not intractable/vestibular migraine  PLAN: Due to CKD, I would not start topiramate.  I will contact Dr. Haroldine Laws (his cardiologist) to see if he has objection using nortriptyline or anti-emetic such as Zofran or promethazine.  Otherwise, I would try Depakote or propranolol.  In the meantime, he will take Tylenol and meclizine for acute attacks, limiting to no more than 2 days out of week to prevent rebound headache.  He should keep headache diary, drink water and exercise  Follow up in 3 months.  Thank you for allowing me to take part in the care of this patient.  Metta Clines, DO  CC:  Sharilyn Sites, MD  Glori Bickers, MD  ADDENDUM:  As per Dr. Haroldine Laws, he may take nortriptyline and an antiemetic such as Zofran or Phenergan.  We will start nortriptyline 25mg  at bedtime (may increase dose in 4 weeks if needed).  For nausea, will prescribe Zofran ODT 4mg  every 8 hours as needed. Metta Clines,  DO

## 2018-01-22 ENCOUNTER — Emergency Department (HOSPITAL_COMMUNITY): Payer: BLUE CROSS/BLUE SHIELD

## 2018-01-22 ENCOUNTER — Emergency Department (HOSPITAL_COMMUNITY)
Admission: EM | Admit: 2018-01-22 | Discharge: 2018-01-22 | Disposition: A | Discharge status: Home / Self Care | Payer: BLUE CROSS/BLUE SHIELD | Attending: Emergency Medicine | Admitting: Emergency Medicine

## 2018-01-22 ENCOUNTER — Other Ambulatory Visit: Payer: Self-pay

## 2018-01-22 ENCOUNTER — Encounter (HOSPITAL_COMMUNITY): Payer: Self-pay | Admitting: Emergency Medicine

## 2018-01-22 DIAGNOSIS — I130 Hypertensive heart and chronic kidney disease with heart failure and stage 1 through stage 4 chronic kidney disease, or unspecified chronic kidney disease: Secondary | ICD-10-CM | POA: Insufficient documentation

## 2018-01-22 DIAGNOSIS — I5022 Chronic systolic (congestive) heart failure: Secondary | ICD-10-CM | POA: Insufficient documentation

## 2018-01-22 DIAGNOSIS — Z79899 Other long term (current) drug therapy: Secondary | ICD-10-CM | POA: Diagnosis not present

## 2018-01-22 DIAGNOSIS — N182 Chronic kidney disease, stage 2 (mild): Secondary | ICD-10-CM | POA: Diagnosis not present

## 2018-01-22 DIAGNOSIS — R0789 Other chest pain: Secondary | ICD-10-CM | POA: Diagnosis not present

## 2018-01-22 DIAGNOSIS — R079 Chest pain, unspecified: Secondary | ICD-10-CM | POA: Diagnosis not present

## 2018-01-22 LAB — BASIC METABOLIC PANEL
Anion gap: 7 (ref 5–15)
BUN: 20 mg/dL (ref 6–20)
CALCIUM: 8.6 mg/dL — AB (ref 8.9–10.3)
CO2: 22 mmol/L (ref 22–32)
Chloride: 108 mmol/L (ref 98–111)
Creatinine, Ser: 1.61 mg/dL — ABNORMAL HIGH (ref 0.61–1.24)
GFR, EST NON AFRICAN AMERICAN: 54 mL/min — AB (ref >60)
Glucose, Bld: 180 mg/dL — ABNORMAL HIGH (ref 70–99)
Potassium: 3.9 mmol/L (ref 3.5–5.1)
SODIUM: 137 mmol/L (ref 135–145)

## 2018-01-22 LAB — CBC
HCT: 47.6 % (ref 39.0–52.0)
Hemoglobin: 16.1 g/dL (ref 13.0–17.0)
MCH: 28 pg (ref 26.0–34.0)
MCHC: 33.8 g/dL (ref 30.0–36.0)
MCV: 82.6 fL (ref 78.0–100.0)
PLATELETS: 208 10*3/uL (ref 150–400)
RBC: 5.76 MIL/uL (ref 4.22–5.81)
RDW: 14.1 % (ref 11.5–15.5)
WBC: 4.3 10*3/uL (ref 4.0–10.5)

## 2018-01-22 LAB — I-STAT TROPONIN, ED: TROPONIN I, POC: 0.04 ng/mL (ref 0.00–0.08)

## 2018-01-22 MED ORDER — MELOXICAM 7.5 MG PO TABS: 15.0000 mg | ORAL_TABLET | Freq: Every day | ORAL | 0 refills | Status: DC

## 2018-01-22 NOTE — ED Provider Notes (Signed)
[facility] EMERGENCY DEPARTMENT Provider Note   CSN: [id number] Arrival date & time: 01/22/18  1658     History   Chief Complaint Chief Complaint  Patient presents with  . Chest Pain    HPI [name] [name] is a 37 y.o. male.  HPI  The pt is a 37 y/o male - he has a hx of CHF - NICM, history of aortic valve repair due to aortic insufficiency, has been seen multiple times by the cardiology service and in fact has had extensive workups for the causes of chest pain in the past including 2 heart catheterizations, the most recent of which was within the last year and showed reproducible nonobstructive coronary disease. He states that when he was at work last night he was reaching for something and felt acute onset of sharp pain in his left chest during this movement. Ever since that time he has had reproducible pain in the left chest with movement of the arm, taking deep breaths and certain movements with the left side of his body. He also has pain at rest however is more exacerbated with those above activities. There is no fevers, no coughing, no swelling of the legs, no history of pulmonary embolism. He does not feel a heaviness or pressure or any radiation of the pain to his back and arms and shoulders jaw or neck.  Past Medical History:  Diagnosis Date  . Anxiety   . CHF (congestive heart failure) ([place])   . Chronic systolic heart failure ([place])   . CKD (chronic kidney disease) stage 2, GFR 60-89 ml/min   . Dyspnea    with value issues- "if I dont take my medication"  . Essential hypertension, benign   . Headache   . History of pneumonia   . Mitral regurgitation    Moderate  . Noncompliance   . Nonischemic cardiomyopathy ([place])    Normal coronaries May 2012, LVEF < 20%  . Pneumonia 2011  . S/P aortic valve repair 05/11/2017   Complex valvuloplasty including plication of left coronary leaflet and 53mm Biostable HAART ring annuloplasty    Patient Active Problem List   Diagnosis Date Noted  . S/P aortic valve repair 05/11/2017  . Aortic valve regurgitation   . Chronic systolic CHF (congestive heart failure) ([place])   . Hypertensive emergency 05/30/2015  . Essential hypertension 11/22/2014  . Shift work sleep disorder 10/30/2014  . Obstructive sleep apnea 10/30/2014  . Hypertensive crisis 09/26/2014  . Hypertensive urgency 06/13/2013  . CHF (congestive heart failure) ([place]) 06/13/2013  . CKD (chronic kidney disease) stage 3, GFR 30-59 ml/min (HCC) 06/13/2013  . Nonischemic cardiomyopathy ([place]) 06/13/2013  . HTN (hypertension) 02/25/2012  . Chronic combined systolic and diastolic heart failure ([place]) 01/08/2012  . Elevated troponin 01/01/2012  . Hypokalemia 12/31/2011  . Chest pain 12/31/2011    Past Surgical History:  Procedure Laterality Date  . AORTIC VALVE REPAIR N/A 05/11/2017   Procedure: AORTIC VALVE REPAIR;  Surgeon: Rexene Alberts, MD;  Location: [place];  Service: Open Heart Surgery;  Laterality: N/A;  . RIGHT/LEFT HEART CATH AND CORONARY ANGIOGRAPHY N/A 04/20/2017   Procedure: RIGHT/LEFT HEART CATH AND CORONARY ANGIOGRAPHY;  Surgeon: Jolaine Artist, MD;  Location: [place] [facility];  Service: Cardiovascular;  Laterality: N/A;  . SURGERY SCROTAL / TESTICULAR     Testicular torsion  . TEE WITHOUT CARDIOVERSION N/A 04/09/2017   Procedure: TRANSESOPHAGEAL ECHOCARDIOGRAM (TEE);  Surgeon: Jolaine Artist, MD;  Location: [place];  Service: Cardiovascular;  Laterality: N/A;  . TEE WITHOUT CARDIOVERSION N/A 05/11/2017   Procedure: TRANSESOPHAGEAL ECHOCARDIOGRAM (TEE);  Surgeon: Rexene Alberts, MD;  Location: [place];  Service: Open Heart Surgery;  Laterality: N/A;        Home Medications    Prior to Admission medications   Medication Sig Start Date End Date Taking? Authorizing Provider  carvedilol (COREG) 25 MG tablet Take 1 tablet (25 mg total) by mouth 2 (two) times daily with a meal. 12/23/17  Yes Bensimhon, Shaune Pascal, MD    ENTRESTO 97-103 MG TAKE 1 TABLET BY MOUTH TWICE DAILY 12/23/17  Yes Bensimhon, Shaune Pascal, MD  furosemide (LASIX) 40 MG tablet Take 1 tablet (40 mg total) 2 (two) times daily by mouth. Patient taking differently: Take 40 mg by mouth daily.  05/16/17  Yes Lars Pinks M, PA-C  hydrALAZINE (APRESOLINE) 25 MG tablet Take 1 tablet (25 mg total) by mouth 3 (three) times daily. 01/05/18  Yes Bensimhon, Shaune Pascal, MD  Ibuprofen-diphenhydrAMINE HCl (ADVIL PM) 200-25 MG CAPS Take 200 mg by mouth as needed (pain).   Yes [provider]  meclizine (ANTIVERT) 25 MG tablet Take 1 tablet (25 mg total) by mouth 3 (three) times daily as needed for dizziness. 07/30/17  Yes Julianne Rice, MD  spironolactone (ALDACTONE) 25 MG tablet Take 0.5 tablets (12.5 mg total) by mouth daily. 01/05/18  Yes Bensimhon, Shaune Pascal, MD  acetaminophen (TYLENOL) 500 MG tablet Take 1,000 mg by mouth every 6 (six) hours as needed for moderate pain or headache.     [provider]  meloxicam (MOBIC) 7.5 MG tablet Take 2 tablets (15 mg total) by mouth daily. 01/22/18   Noemi Chapel, MD    Family History Family History  Problem Relation Age of Onset  . Breast cancer Mother   . Stroke Father   . Hypertension Father     Social History Social History   Tobacco Use  . Smoking status: Never Smoker  . Smokeless tobacco: Never Used  Substance Use Topics  . Alcohol use: Yes    Alcohol/week: 0.0 oz    Comment: occasional  . Drug use: No     Allergies   Patient has no known allergies.   Review of Systems Review of Systems  All other systems reviewed and are negative.    Physical Exam Updated Vital Signs BP 134/77 (BP Location: Left Arm)   Pulse 73   Temp 97.7 F (36.5 C) (Oral)   Resp (!) 25   Ht 5\' 10"  (1.778 m)   Wt 102.5 kg (226 lb)   SpO2 98%   BMI 32.43 kg/m   Physical Exam  Constitutional: He appears well-developed and well-nourished. No distress.  HENT:  Head: Normocephalic and  atraumatic.  Mouth/Throat: Oropharynx is clear and moist. No oropharyngeal exudate.  Eyes: Pupils are equal, round, and reactive to light. Conjunctivae and EOM are normal. Right eye exhibits no discharge. Left eye exhibits no discharge. No scleral icterus.  Neck: Normal range of motion. Neck supple. No JVD present. No thyromegaly present.  Cardiovascular: Normal rate, regular rhythm, normal heart sounds and intact distal pulses. Exam reveals no gallop and no friction rub.  No murmur heard. Pulmonary/Chest: Effort normal and breath sounds normal. No respiratory distress. He has no wheezes. He has no rales.  Abdominal: Soft. Bowel sounds are normal. He exhibits no distension and no mass. There is no tenderness.  Musculoskeletal: Normal range of motion. He exhibits no edema or tenderness.  Lymphadenopathy:  He has no cervical adenopathy.  Neurological: He is alert. Coordination normal.  Skin: Skin is warm and dry. No rash noted. No erythema.  Well-healed sternotomy scar  Psychiatric: He has a normal mood and affect. His behavior is normal.  Nursing note and vitals reviewed.    ED Treatments / Results  Labs (all labs ordered are listed, but only abnormal results are displayed) Labs Reviewed  BASIC METABOLIC PANEL - Abnormal; Notable for the following components:      Result Value   Glucose, Bld 180 (*)    Creatinine, Ser 1.61 (*)    Calcium 8.6 (*)    GFR calc non Af Amer 54 (*)    All other components within normal limits  CBC  I-STAT TROPONIN, ED    EKG EKG Interpretation  Date/Time:  Saturday January 22 2018 17:05:48 EDT Ventricular Rate:  70 PR Interval:    QRS Duration: 114 QT Interval:  396 QTC Calculation: 428 R Axis:   -3 Text Interpretation:  Sinus rhythm Ventricular premature complex Prolonged PR interval Probable left atrial enlargement Incomplete left bundle branch block LVH with secondary repolarization abnormality Since last tracing in June of 2019, no changes  Confirmed by Noemi Chapel [id number]) on 01/22/2018 5:25:02 PM   Radiology Dg Chest 2 View  Result Date: 01/22/2018 CLINICAL DATA:  Nonradiating left-sided chest pain beginning this morning. EXAM: CHEST - 2 VIEW COMPARISON:  07/30/2017 FINDINGS: There are changes from previous cardiac surgery. Cardiac silhouette is mildly enlarged. No mediastinal or hilar masses. No evidence of adenopathy. Clear lungs.  No pleural effusion or pneumothorax. Skeletal structures are intact. IMPRESSION: 1. No acute cardiopulmonary disease. 2. Stable cardiomegaly and changes from prior cardiac surgery. Electronically Signed   By: Lajean Manes M.D.   On: 01/22/2018 18:07    Procedures Procedures (including critical care time)  Medications Ordered in ED Medications - No data to display   Initial Impression / Assessment and Plan / ED Course  I have reviewed the triage vital signs and the nursing notes.  Pertinent labs & imaging results that were available during my care of the patient were reviewed by me and considered in my medical decision making (see chart for details).  Clinical Course as of Jan 23 1828  Sat Jan 22, 2018  [place] Labs reviewed, compared with prior labs, creatinine is 1.6, this is the same as prior, blood counts are normal and troponin is normal.   [BM]  1827 X-ray shows cardiomegaly consistent with prior, no other acute findings including no pneumothorax and no pneumonia.   [BM]  8546 Patient updated with HIS findings, anti-inflammatories and home, patient stable for discharge, will keep him out of work today.   [BM]    Clinical Course User Index [BM] Noemi Chapel, MD    There are no heart murmurs, there is no edema or JVD, the lung sounds and heart sounds are normal, he does have some pleuritic pain when he takes deep breaths. Without tachycardia or hypoxia tachypnea or peripheral edema or other risk for pulmonary embolism it makes it much more likely that this is musculoskeletal giventhe  worsening with left arm movement and the onset at work when he was reaching with his arm. EKG is unchanged, there are signs of left ventricular hypertrophy with repolarization abnormalities which are unchanged from prior EKG. Labs were ordered by nursing, chest x-ray has been ordered, as long as these are okay the patient should be stable for discharge on NSAIDs    Final  Clinical Impressions(s) / ED Diagnoses   Final diagnoses:  Chest wall pain    ED Discharge Orders        Ordered    meloxicam (MOBIC) 7.5 MG tablet  Daily     01/22/18 1828       Noemi Chapel, MD 01/22/18 1829

## 2018-01-22 NOTE — Discharge Instructions (Signed)
Mobic twice daily as needed for pain Rest your chest wall (no lifting) for 24 hours,  ER for worsening symptoms.  Please obtain all of your results from medical records or have your doctors office obtain the results - share them with your doctor - you should be seen at your doctors office in the next 2 days. Call today to arrange your follow up. Take the medications as prescribed. Please review all of the medicines and only take them if you do not have an allergy to them. Please be aware that if you are taking birth control pills, taking other prescriptions, ESPECIALLY ANTIBIOTICS may make the birth control ineffective - if this is the case, either do not engage in sexual activity or use alternative methods of birth control such as condoms until you have finished the medicine and your family doctor says it is OK to restart them. If you are on a blood thinner such as COUMADIN, be aware that any other medicine that you take may cause the coumadin to either work too much, or not enough - you should have your coumadin level rechecked in next 7 days if this is the case.  ?  It is also a possibility that you have an allergic reaction to any of the medicines that you have been prescribed - Everybody reacts differently to medications and while MOST people have no trouble with most medicines, you may have a reaction such as nausea, vomiting, rash, swelling, shortness of breath. If this is the case, please stop taking the medicine immediately and contact your physician.  ?  You should return to the ER if you develop severe or worsening symptoms.

## 2018-01-22 NOTE — ED Triage Notes (Addendum)
Patient c/o left side chest pain, non radiating. Per patient started this morning while at work. Patient states that he went to lift a heavy roll when he felt the sever chest pain. Patient states some shortness of breath and back pain. Denies any nausea, vomiting, dizziness, or weakness. Pain worse with movement and palpitation. Patient does have cardiac hx of CHF and open heart surgery. Per wife patient was told he may have to get a defibrillator at last cardiologist appointment but she is unsure as to why. Denies any swelling ion lower extremities.

## 2018-01-31 ENCOUNTER — Encounter: Payer: Self-pay | Admitting: Internal Medicine

## 2018-01-31 ENCOUNTER — Ambulatory Visit: Payer: BLUE CROSS/BLUE SHIELD | Admitting: Internal Medicine

## 2018-01-31 VITALS — BP 116/74 | HR 69 | Ht 70.0 in | Wt 229.4 lb

## 2018-01-31 DIAGNOSIS — I5022 Chronic systolic (congestive) heart failure: Secondary | ICD-10-CM

## 2018-01-31 DIAGNOSIS — Z9889 Other specified postprocedural states: Secondary | ICD-10-CM

## 2018-01-31 DIAGNOSIS — Z8679 Personal history of other diseases of the circulatory system: Secondary | ICD-10-CM

## 2018-01-31 NOTE — Patient Instructions (Signed)
Medication Instructions:  The current medical regimen is effective;  continue present plan and medications.  Testing/Procedures: Your physician has requested that you have an echocardiogram. Echocardiography is a painless test that uses sound waves to create images of your heart. It provides your doctor with information about the size and shape of your heart and how well your heart's chambers and valves are working. This procedure takes approximately one hour. There are no restrictions for this procedure.  This is due the 1 st week in September 2019.  Follow-Up: Follow up based on the results of your 2 D Echo.  If you need a refill on your cardiac medications before your next appointment, please call your pharmacy.  Thank you for choosing [facility]!!

## 2018-01-31 NOTE — Progress Notes (Addendum)
HPI [name] [name] is referred today by Dr. Reine Just to consider ICD insertion. He is a pleasant 37 yo man with HTN heart disease, chronic systolic heart failure which has worsened and improved, AI s/p AVR, now with residual AI, who had a repeat echo in June which demonstrated an EF of 30-35%. He has class 2 symptoms.  No Known Allergies   Current Outpatient Medications  Medication Sig Dispense Refill  . acetaminophen (TYLENOL) 500 MG tablet Take 1,000 mg by mouth every 6 (six) hours as needed for moderate pain or headache.     . carvedilol (COREG) 25 MG tablet Take 1 tablet (25 mg total) by mouth 2 (two) times daily with a meal. 60 tablet 6  . ENTRESTO 97-103 MG TAKE 1 TABLET BY MOUTH TWICE DAILY 60 tablet 3  . furosemide (LASIX) 40 MG tablet Take 1 tablet (40 mg total) 2 (two) times daily by mouth. 60 tablet 1  . hydrALAZINE (APRESOLINE) 25 MG tablet Take 1 tablet (25 mg total) by mouth 3 (three) times daily. 90 tablet 3  . Ibuprofen-diphenhydrAMINE HCl (ADVIL PM) 200-25 MG CAPS Take 200 mg by mouth as needed (pain).    . meclizine (ANTIVERT) 25 MG tablet Take 1 tablet (25 mg total) by mouth 3 (three) times daily as needed for dizziness. 30 tablet 0  . spironolactone (ALDACTONE) 25 MG tablet Take 0.5 tablets (12.5 mg total) by mouth daily. 15 tablet 6   No current facility-administered medications for this visit.      Past Medical History:  Diagnosis Date  . Anxiety   . CHF (congestive heart failure) ([place])   . Chronic systolic heart failure ([place])   . CKD (chronic kidney disease) stage 2, GFR 60-89 ml/min   . Dyspnea    with value issues- "if I dont take my medication"  . Essential hypertension, benign   . Headache   . History of pneumonia   . Mitral regurgitation    Moderate  . Noncompliance   . Nonischemic cardiomyopathy ([place])    Normal coronaries May 2012, LVEF < 20%  . Pneumonia 2011  . S/P aortic valve repair 05/11/2017   Complex valvuloplasty including plication of left  coronary leaflet and 87mm Biostable HAART ring annuloplasty    ROS:   All systems reviewed and negative except as noted in the HPI.   Past Surgical History:  Procedure Laterality Date  . AORTIC VALVE REPAIR N/A 05/11/2017   Procedure: AORTIC VALVE REPAIR;  Surgeon: Rexene Alberts, MD;  Location: [place];  Service: Open Heart Surgery;  Laterality: N/A;  . RIGHT/LEFT HEART CATH AND CORONARY ANGIOGRAPHY N/A 04/20/2017   Procedure: RIGHT/LEFT HEART CATH AND CORONARY ANGIOGRAPHY;  Surgeon: Jolaine Artist, MD;  Location: [facility];  Service: Cardiovascular;  Laterality: N/A;  . SURGERY SCROTAL / TESTICULAR     Testicular torsion  . TEE WITHOUT CARDIOVERSION N/A 04/09/2017   Procedure: TRANSESOPHAGEAL ECHOCARDIOGRAM (TEE);  Surgeon: Jolaine Artist, MD;  Location: [facility];  Service: Cardiovascular;  Laterality: N/A;  . TEE WITHOUT CARDIOVERSION N/A 05/11/2017   Procedure: TRANSESOPHAGEAL ECHOCARDIOGRAM (TEE);  Surgeon: Rexene Alberts, MD;  Location: [place];  Service: Open Heart Surgery;  Laterality: N/A;     Family History  Problem Relation Age of Onset  . Breast cancer Mother   . Stroke Father   . Hypertension Father      Social History   Socioeconomic History  . Marital status: Married  Spouse name: RaShannon  . Number of children: 0  . Years of education: Not on file  . Highest education level: Some college, no degree  Occupational History    Employer: [employer]  . Financial resource strain: Not on file  . Food insecurity:    Worry: Not on file    Inability: Not on file  . Transportation needs:    Medical: Not on file    Non-medical: Not on file  Tobacco Use  . Smoking status: Never Smoker  . Smokeless tobacco: Never Used  Substance and Sexual Activity  . Alcohol use: Yes    Alcohol/week: 0.0 oz    Comment: occasional  . Drug use: No  . Sexual activity: Yes    Birth control/protection: Condom  Lifestyle  . Physical  activity:    Days per week: Not on file    Minutes per session: Not on file  . Stress: Not on file  Relationships  . Social connections:    Talks on phone: Not on file    Gets together: Not on file    Attends religious service: Not on file    Active member of club or organization: Not on file    Attends meetings of clubs or organizations: Not on file    Relationship status: Not on file  . Intimate partner violence:    Fear of current or ex partner: Not on file    Emotionally abused: Not on file    Physically abused: Not on file    Forced sexual activity: Not on file  Other Topics Concern  . Not on file  Social History Narrative   Occupation: just started sanitation job cleaning      Patient is right-handed. He lives with his wife in a 1 story house. He drinks 1-2 sodas a day.      BP 116/74   Pulse 69   Ht 5\' 10"  (1.778 m)   Wt 229 lb 6.4 oz (104.1 kg)   SpO2 98%   BMI 32.92 kg/m   Physical Exam:  Well appearing 37 yo man, NAD HEENT: Unremarkable Neck:  6 cm JVD, no thyromegally Lymphatics:  No adenopathy Back:  No CVA tenderness Lungs:  Clear with no wheezes HEART:  Regular rate rhythm, 2/6 AI  murmur, no rubs, no clicks Abd:  soft, positive bowel sounds, no organomegally, no rebound, no guarding Ext:  2 plus pulses, no edema, no cyanosis, no clubbing Skin:  No rashes no nodules Neuro:  CN II through XII intact, motor grossly intact  EKG - nsr with LVH and qrs widening   Assess/Plan: 1. Chronic systolic heart failure- his EF has been up and down. I have recommended he undergo a repeat echo on maximal medical therapy 3-4 months after his last echo. If his EF remains reduced then I have recommended proceeding with ICD insertion. If his EF improves, then no ICD indicated for primary prevention. 2. AI - we will need to follow this.Clearly present on exam and on echo. His pulse pressure is not increased 3. HTN - his blood pressure is well controlled on maximal  medical therapy.  [name] [name].D.

## 2018-03-02 ENCOUNTER — Telehealth: Payer: Self-pay

## 2018-03-02 MED ORDER — NORTRIPTYLINE HCL 25 MG PO CAPS: 25.0000 mg | ORAL_CAPSULE | Freq: Every day | ORAL | 3 refills | Status: DC

## 2018-03-02 MED ORDER — ONDANSETRON 4 MG PO TBDP: 4.0000 mg | ORAL_TABLET | Freq: Three times a day (TID) | ORAL | 0 refills | Status: DC | PRN

## 2018-03-02 NOTE — Telephone Encounter (Signed)
-----   Message from Pieter Partridge, DO sent at 01/11/2018  6:46 AM EDT ----- I would like to prescribe patient nortriptyline 25mg  at bedtime to help prevent vestibular migraine.  If dizzy spells not improved in 4 weeks, he should contact us and we can increase dose.  For nausea, let us prescribe Zofran ODT 4mg  every 8 hours as needed.

## 2018-03-10 ENCOUNTER — Encounter (HOSPITAL_COMMUNITY): Payer: BLUE CROSS/BLUE SHIELD | Admitting: Internal Medicine

## 2018-03-17 ENCOUNTER — Ambulatory Visit (HOSPITAL_COMMUNITY): Payer: BLUE CROSS/BLUE SHIELD | Attending: Internal Medicine

## 2018-04-11 ENCOUNTER — Encounter: Payer: Self-pay | Admitting: Internal Medicine

## 2018-04-12 NOTE — Progress Notes (Deleted)
NEUROLOGY FOLLOW UP OFFICE NOTE  [name] [name] [id number]  HISTORY OF PRESENT ILLNESS: [name] [name] is a 37 year old right-handed male with systolic heart failure, hypertension, s/p aortic valve repair and CKD stage II-III who follows up for migraine with aura/vestibular migraine.  UPDATE: Intensity:  *** Duration:  *** Frequency:  *** Frequency of abortive medication: *** Current NSAIDS:  no Current analgesics:  Tylenol Current triptans:  no Current ergotamine:  no Current anti-emetic:  Zofran ODT 4mg  Current muscle relaxants:  no Current anti-anxiolytic:  no Current sleep aide:  no Current Antihypertensive medications:  Coreg, Lasix, spironolactone Current Antidepressant medications:  Nortriptyline 25mg  at bedtime Current Anticonvulsant medications:  no Current anti-CGRP:  no Current Vitamins/Herbal/Supplements:  no Current Antihistamines/Decongestants:  no Other therapy:  no Other medication:  no  Caffeine:  Rarely Dr. Malachi Bonds Alcohol:  rarely Smoker:  no Diet:  Water.  Sprite Exercise:  no Depression:  no; Anxiety:  yes Sleep hygiene:  poor  HISTORY: He began experiencing episodes of dizziness in early 2018.  He describes a severe spinning sensation blurred/double vision and severe nausea that can last a couple of minutes.  Sometimes he has associated bifrontal headaches, usually mild non-throbbing and occasionally severe throbbing, which lasts 1 to 2 hours with Tylenol.  There is no associated vomiting, unilateral numbness or weakness.  It occurs about 4 times a week.  There are no specific aggravating factors.  Sleep helps relieve it.  He treats it with Tylenol, meclizine and sleep.  He was evaluated at vestibular rehab where no central or peripheral vestibulopathy was appreciated.    Past NSAIDS:  no Past analgesics:  no Past abortive triptans:  no Past muscle relaxants:  no Past anti-emetic:  no Other past therapies:  no  He reports past history  of severe headaches as a child and adolescence. Family history of headache:  mom  PAST MEDICAL HISTORY: Past Medical History:  Diagnosis Date  . Anxiety   . CHF (congestive heart failure) ([facility])   . Chronic systolic heart failure ([place])   . CKD (chronic kidney disease) stage 2, GFR 60-89 ml/min   . Dyspnea    with value issues- "if I dont take my medication"  . Essential hypertension, benign   . Headache   . History of pneumonia   . Mitral regurgitation    Moderate  . Noncompliance   . Nonischemic cardiomyopathy ([place])    Normal coronaries May 2012, LVEF < 20%  . Pneumonia 2011  . S/P aortic valve repair 05/11/2017   Complex valvuloplasty including plication of left coronary leaflet and 77mm Biostable HAART ring annuloplasty    MEDICATIONS: Current Outpatient Medications on File Prior to Visit  Medication Sig Dispense Refill  . acetaminophen (TYLENOL) 500 MG tablet Take 1,000 mg by mouth every 6 (six) hours as needed for moderate pain or headache.     . carvedilol (COREG) 25 MG tablet Take 1 tablet (25 mg total) by mouth 2 (two) times daily with a meal. 60 tablet 6  . ENTRESTO 97-103 MG TAKE 1 TABLET BY MOUTH TWICE DAILY 60 tablet 3  . furosemide (LASIX) 40 MG tablet Take 1 tablet (40 mg total) 2 (two) times daily by mouth. 60 tablet 1  . hydrALAZINE (APRESOLINE) 25 MG tablet Take 1 tablet (25 mg total) by mouth 3 (three) times daily. 90 tablet 3  . Ibuprofen-diphenhydrAMINE HCl (ADVIL PM) 200-25 MG CAPS Take 200 mg by mouth as needed (pain).    Marland Kitchen  meclizine (ANTIVERT) 25 MG tablet Take 1 tablet (25 mg total) by mouth 3 (three) times daily as needed for dizziness. 30 tablet 0  . nortriptyline (PAMELOR) 25 MG capsule Take 1 capsule (25 mg total) by mouth at bedtime. 30 capsule 3  . ondansetron (ZOFRAN ODT) 4 MG disintegrating tablet Take 1 tablet (4 mg total) by mouth every 8 (eight) hours as needed for nausea or vomiting. 20 tablet 0  . spironolactone (ALDACTONE) 25 MG tablet Take  0.5 tablets (12.5 mg total) by mouth daily. 15 tablet 6   No current facility-administered medications on file prior to visit.     ALLERGIES: No Known Allergies  FAMILY HISTORY: Family History  Problem Relation Age of Onset  . Breast cancer Mother   . Stroke Father   . Hypertension Father    SOCIAL HISTORY: Social History   Socioeconomic History  . Marital status: Married    Spouse name: RaShannon  . Number of children: 0  . Years of education: Not on file  . Highest education level: Some college, no degree  Occupational History    Employer: [employer]  . Financial resource strain: Not on file  . Food insecurity:    Worry: Not on file    Inability: Not on file  . Transportation needs:    Medical: Not on file    Non-medical: Not on file  Tobacco Use  . Smoking status: Never Smoker  . Smokeless tobacco: Never Used  Substance and Sexual Activity  . Alcohol use: Yes    Alcohol/week: 0.0 standard drinks    Comment: occasional  . Drug use: No  . Sexual activity: Yes    Birth control/protection: Condom  Lifestyle  . Physical activity:    Days per week: Not on file    Minutes per session: Not on file  . Stress: Not on file  Relationships  . Social connections:    Talks on phone: Not on file    Gets together: Not on file    Attends religious service: Not on file    Active member of club or organization: Not on file    Attends meetings of clubs or organizations: Not on file    Relationship status: Not on file  . Intimate partner violence:    Fear of current or ex partner: Not on file    Emotionally abused: Not on file    Physically abused: Not on file    Forced sexual activity: Not on file  Other Topics Concern  . Not on file  Social History Narrative   Occupation: just started sanitation job cleaning      Patient is right-handed. He lives with his wife in a 1 story house. He drinks 1-2 sodas a day.     REVIEW OF  SYSTEMS: Constitutional: No fevers, chills, or sweats, no generalized fatigue, change in appetite Eyes: No visual changes, double vision, eye pain Ear, nose and throat: No hearing loss, ear pain, nasal congestion, sore throat Cardiovascular: No chest pain, palpitations Respiratory:  No shortness of breath at rest or with exertion, wheezes GastrointestinaI: No nausea, vomiting, diarrhea, abdominal pain, fecal incontinence Genitourinary:  No dysuria, urinary retention or frequency Musculoskeletal:  No neck pain, back pain Integumentary: No rash, pruritus, skin lesions Neurological: as above Psychiatric: No depression, insomnia, anxiety Endocrine: No palpitations, fatigue, diaphoresis, mood swings, change in appetite, change in weight, increased thirst Hematologic/Lymphatic:  No purpura, petechiae. Allergic/Immunologic: no itchy/runny eyes, nasal congestion, recent allergic reactions, rashes  PHYSICAL EXAM: *** General: No acute distress.  Patient appears ***-groomed.  *** body habitus. Head:  Normocephalic/atraumatic Eyes:  Fundi examined but not visualized Neck: supple, no paraspinal tenderness, full range of motion Heart:  Regular rate and rhythm Lungs:  Clear to auscultation bilaterally Back: No paraspinal tenderness Neurological Exam: alert and oriented to person, place, and time. Attention span and concentration intact, recent and remote memory intact, fund of knowledge intact.  Speech fluent and not dysarthric, language intact.  CN II-XII intact. Bulk and tone normal, muscle strength 5/5 throughout.  Sensation to light touch, temperature and vibration intact.  Deep tendon reflexes 2+ throughout, toes downgoing.  Finger to nose and heel to shin testing intact.  Gait normal, Romberg negative.  IMPRESSION: Probable migraine with aura, without status migrainosus, not intractable/vestibular migraine  PLAN: 1.  For preventative management, *** 2.  For abortive therapy, *** 3.  Limit  use of pain relievers to no more than 2 days out of week to prevent risk of rebound or medication-overuse headache. 4.  Keep headache diary 5.  Exercise, hydration, caffeine cessation, sleep hygiene, monitor for and avoid triggers 6.  Consider:  magnesium citrate 400mg  daily, riboflavin 400mg  daily, and coenzyme Q10 100mg  three times daily 7.  Follow up ***  Metta Clines, DO  CC: Sharilyn Sites, MD

## 2018-04-14 ENCOUNTER — Ambulatory Visit: Payer: BLUE CROSS/BLUE SHIELD | Admitting: Neurology

## 2018-04-15 ENCOUNTER — Telehealth: Payer: Self-pay

## 2018-04-15 NOTE — Telephone Encounter (Signed)
New message    Just an FYI. We have made several attempts to contact this patient including sending a letter to schedule or reschedule their echocardiogram. We will be removing the patient from the echo WQ.   Thank you 

## 2018-04-16 NOTE — Telephone Encounter (Signed)
Ok. GT

## 2018-05-02 ENCOUNTER — Encounter: Payer: BLUE CROSS/BLUE SHIELD | Admitting: Thoracic Surgery (Cardiothoracic Vascular Surgery)

## 2018-05-28 ENCOUNTER — Other Ambulatory Visit (HOSPITAL_COMMUNITY): Payer: Self-pay | Admitting: Internal Medicine

## 2018-07-29 DIAGNOSIS — Z3141 Encounter for fertility testing: Secondary | ICD-10-CM | POA: Diagnosis not present

## 2018-08-03 DIAGNOSIS — Z31440 Encounter of male for testing for genetic disease carrier status for procreative management: Secondary | ICD-10-CM | POA: Diagnosis not present

## 2018-08-03 DIAGNOSIS — Z3141 Encounter for fertility testing: Secondary | ICD-10-CM | POA: Diagnosis not present

## 2018-08-05 ENCOUNTER — Other Ambulatory Visit (HOSPITAL_COMMUNITY): Payer: Self-pay | Admitting: Internal Medicine

## 2018-08-05 NOTE — Telephone Encounter (Signed)
Pt needs follow up appointment [phone]

## 2018-09-23 ENCOUNTER — Other Ambulatory Visit (HOSPITAL_COMMUNITY): Payer: Self-pay | Admitting: Internal Medicine

## 2018-11-07 ENCOUNTER — Telehealth (HOSPITAL_COMMUNITY): Payer: Self-pay | Admitting: Vascular Surgery

## 2018-11-07 NOTE — Telephone Encounter (Signed)
Called pt to reschedule or make tele vist for 5/14 appt, pt phone is disconnected , will put pt on waitlist

## 2018-11-14 ENCOUNTER — Other Ambulatory Visit (HOSPITAL_COMMUNITY): Payer: Self-pay | Admitting: Internal Medicine

## 2018-11-17 DIAGNOSIS — Z6834 Body mass index (BMI) 34.0-34.9, adult: Secondary | ICD-10-CM | POA: Diagnosis not present

## 2018-11-17 DIAGNOSIS — E6609 Other obesity due to excess calories: Secondary | ICD-10-CM | POA: Diagnosis not present

## 2018-11-17 DIAGNOSIS — I429 Cardiomyopathy, unspecified: Secondary | ICD-10-CM | POA: Diagnosis not present

## 2018-11-17 DIAGNOSIS — B349 Viral infection, unspecified: Secondary | ICD-10-CM | POA: Diagnosis not present

## 2018-11-17 DIAGNOSIS — I5042 Chronic combined systolic (congestive) and diastolic (congestive) heart failure: Secondary | ICD-10-CM | POA: Diagnosis not present

## 2018-11-17 DIAGNOSIS — Z20828 Contact with and (suspected) exposure to other viral communicable diseases: Secondary | ICD-10-CM | POA: Diagnosis not present

## 2018-11-24 ENCOUNTER — Encounter (HOSPITAL_COMMUNITY): Payer: BLUE CROSS/BLUE SHIELD | Admitting: Internal Medicine

## 2019-01-06 ENCOUNTER — Other Ambulatory Visit (HOSPITAL_COMMUNITY): Payer: Self-pay | Admitting: Internal Medicine

## 2019-01-16 ENCOUNTER — Other Ambulatory Visit (HOSPITAL_COMMUNITY): Payer: Self-pay | Admitting: Internal Medicine

## 2019-01-19 ENCOUNTER — Other Ambulatory Visit: Payer: Self-pay

## 2019-01-19 ENCOUNTER — Ambulatory Visit (HOSPITAL_COMMUNITY)
Admission: RE | Admit: 2019-01-19 | Discharge: 2019-01-19 | Disposition: A | Discharge status: Home / Self Care | Payer: BC Managed Care – PPO | Source: Ambulatory Visit | Attending: Cardiology | Admitting: Cardiology

## 2019-01-19 ENCOUNTER — Encounter (HOSPITAL_COMMUNITY): Payer: Self-pay

## 2019-01-19 VITALS — BP 120/86 | HR 70 | Wt 232.0 lb

## 2019-01-19 DIAGNOSIS — I5020 Unspecified systolic (congestive) heart failure: Secondary | ICD-10-CM | POA: Diagnosis not present

## 2019-01-19 DIAGNOSIS — R0789 Other chest pain: Secondary | ICD-10-CM

## 2019-01-19 DIAGNOSIS — I5022 Chronic systolic (congestive) heart failure: Secondary | ICD-10-CM

## 2019-01-19 DIAGNOSIS — R079 Chest pain, unspecified: Secondary | ICD-10-CM

## 2019-01-19 DIAGNOSIS — N183 Chronic kidney disease, stage 3 (moderate): Secondary | ICD-10-CM | POA: Diagnosis not present

## 2019-01-19 DIAGNOSIS — I10 Essential (primary) hypertension: Secondary | ICD-10-CM

## 2019-01-19 DIAGNOSIS — I130 Hypertensive heart and chronic kidney disease with heart failure and stage 1 through stage 4 chronic kidney disease, or unspecified chronic kidney disease: Secondary | ICD-10-CM | POA: Insufficient documentation

## 2019-01-19 DIAGNOSIS — Z79899 Other long term (current) drug therapy: Secondary | ICD-10-CM | POA: Insufficient documentation

## 2019-01-19 DIAGNOSIS — I428 Other cardiomyopathies: Secondary | ICD-10-CM | POA: Insufficient documentation

## 2019-01-19 NOTE — Patient Instructions (Signed)
Labs were done today. We will call you with any ABNORMAL results. No news is good news!  Your physician has requested that you have an echocardiogram. Echocardiography is a painless test that uses sound waves to create images of your heart. It provides your doctor with information about the size and shape of your heart and how well your heart's chambers and valves are working. This procedure takes approximately one hour. There are no restrictions for this procedure. This order has been placed to be completed at [facility], they will call to schedule an appointment with you.  Please take 40 mg (2 tabs) extra Lasix ONE DOSE ONLY.  Your physician recommends that you schedule a follow-up appointment in: 3 Months.  At the [facility], you and your health needs are our priority. As part of our continuing mission to provide you with exceptional heart care, we have created designated Provider Care Teams. These Care Teams include your primary Cardiologist (physician) and Advanced Practice Providers (APPs- Physician Assistants and Nurse Practitioners) who all work together to provide you with the care you need, when you need it.   You may see any of the following providers on your designated Care Team at your next follow up: Marland Kitchen Dr Glori Bickers . Dr Loralie Champagne . Darrick Grinder, NP

## 2019-01-19 NOTE — Progress Notes (Signed)
Advanced Heart Failure Clinic Note   Patient ID: [name] [name], male   DOB: May 10, 1981, 38 y.o.   MRN: [id number]  Primary PCP: [name] [name]  Primary Cardiologist: Dr. Haroldine [name]  HPI: [name] [name] is a 38 y.o.male with systolic HF due to  NICM, hypertension and CKD stage II-III (baseline Cr 1.5-1.7).   He was first diagnosed with HF in 2012 and his EF recovered and was instructed to stop taking carvedilol, spironolactone, and lasix by previous cardiologist. Admitted to [name] 6/96/78 for acute systolic heart failure. 12/31/11 ProBNP 3777. HIV nonreactive. Thyroid panel normal. Renal ultrasound was negative for obstruction. EF 20%. Discharge weight 193 lbs. Cath with normal coronaries.  Admitted 05/30/2015 with HTN urgency with troponin elevation likely consistent with myocardial strain/demand ischemia in the setting of medication non compliance, having run out 2-3 weeks ago and not refilling them. Placed back on his coreg, hydralazine, and spiro and was symptomatically stable on discharge. No ACE/ARB with CKD and does not tolerate nitrates with headaches. Discharge weight 193 lbs.  Underwent TEE in 9/18 with severe AI. EF 40-45%. Cath with normal coronaries. S/P AV repair 05/11/2017 with Dr. Roxy [name].  PYP scan negative 10/18  Today he returns for HF follow up. Overall feeling fine but had some chest discomfort last week. Says just started working again 4 weeks ago and he has been lifting heavy objects and pulling/pushing items at work.  Mild SOB with steps. Denies PND/Orthopnea. Appetite ok but has been eating lots fast food.  No fever or chills. Not weighing at home.  Taking all medications. Working full time at [employer].   ECHO 06/13/13: EF 20-25% ECHO 09/26/2014: EF 40%  ECHO 05/31/15 EF 20-25% Echo 11/17 EF 45-50% ECHO 03/2017 EF ~45% Severe AI  ECHO 06/23/2017: EF 45-50% . Grade II DD Left ventricle: The cavity size was normal. Systolic function was   mildly reduced. The estimated  ejection fraction was in the range   of 45% to 50%. Diffuse hypokinesis. Features are consistent with   a pseudonormal left ventricular filling pattern, with concomitant   abnormal relaxation and increased filling pressure (grade 2   diastolic dysfunction). Moderate to severe concentric left   ventricular hypertrophy. - Aortic valve: S/p aortic valve repair (Biostable HAART aortic   ring annuloplasty). There was moderate regurgitation, best seen   on apical 3 and 4 chamber views. Valve area (VTI): 1.24 cm^2.   Valve area (Vmax): 1.22 cm^2. Valve area (Vmean): 1.33 cm^2. - Aorta: Mild to moderate aortic root dilatation. - Right ventricle: Systolic function was moderately reduced. ECHO 2019 EF 30-35%  Moderate regurgitation. RV moderately reduced.   Review of systems complete and found to be negative unless listed in HPI.   Past Medical History:  Diagnosis Date   Anxiety    CHF (congestive heart failure) ([facility])    Chronic systolic heart failure ([facility])    CKD (chronic kidney disease) stage 2, GFR 60-89 ml/min    Dyspnea    with value issues- "if I dont take my medication"   Essential hypertension, benign    Headache    History of pneumonia    Mitral regurgitation    Moderate   Noncompliance    Nonischemic cardiomyopathy ([place])    Normal coronaries May 2012, LVEF < 20%   Pneumonia 2011   S/P aortic valve repair 05/11/2017   Complex valvuloplasty including plication of left coronary leaflet and 74mm Biostable HAART ring annuloplasty    Current Outpatient Medications  Medication Sig Dispense  Refill   acetaminophen (TYLENOL) 500 MG tablet Take 1,000 mg by mouth every 6 (six) hours as needed for moderate pain or headache.      carvedilol (COREG) 25 MG tablet TAKE 1 TABLET BY MOUTH TWICE DAILY WITH MEALS 60 tablet 0   ENTRESTO 97-103 MG Take 1 tablet by mouth twice daily 60 tablet 0   furosemide (LASIX) 40 MG tablet Take 2 tablets by mouth once daily 60 tablet 0    hydrALAZINE (APRESOLINE) 25 MG tablet TAKE 1 TABLET BY MOUTH THREE TIMES DAILY 90 tablet 0   No current facility-administered medications for this encounter.     Vitals:   01/19/19 1156  BP: 120/86  Pulse: 70  SpO2: 98%  Weight: 105.2 kg (232 lb)   Wt Readings from Last 3 Encounters:  01/19/19 105.2 kg (232 lb)  01/31/18 104.1 kg (229 lb 6.4 oz)  01/22/18 102.5 kg (226 lb)    PHYSICAL EXAM: General:  Well appearing. No resp difficulty HEENT: normal Neck: supple. JVP 9-10 . Carotids 2+ bilat; no bruits. No lymphadenopathy or thryomegaly appreciated. Cor: PMI nondisplaced. Regular rate & rhythm. No rubs, gallops . 2/6 AS. Lungs: clear Abdomen: soft, nontender, nondistended. No hepatosplenomegaly. No bruits or masses. Good bowel sounds. Extremities: no cyanosis, clubbing, rash, edema Neuro: alert & orientedx3, cranial nerves grossly intact. moves all 4 extremities w/o difficulty. Affect pleasant  EKG; SR 71 bpm   ASSESSMENT & PLAN: 1) Chronic systolic HF: NICM,  ECHO ~22% in 2013. EF 45-50% in 2016. Suspect due to ongoing AI +/- HTN - s/p AoV repair 10/18. F/u echo 12/18 with EF 45-50% moderate AI - ECHO 2019 EF 30-35% Mod AI. Set up for repeat ECHO   - PYP scan negative for amyloid.  - NYHA II. Volume status mildly elevated. Give extra dose of lasix today then he will continue 40 mg po lasix.  - Continue coreg 25 mg BID - Continue Entresto 97/103 mg BID - Continue 25 mg mg hydralazine three times a day (had HAs with Imdur in past) - Not on spiro. Not sure why. Check BMEt and consider at next office visit.  2) HTN :  -Stable.  3) CKD stage III:  (baseline Cr 1.5-1.7)  Check BMEt  4) S/P AV repair 05/01/2017 - Has finished CR. - moderate AI on ECHO. Unchanged 5) Chest Pain EKG ok. No ST changes. Had cath 2018. Suspect musculoskeletal.     Set up for repeat ECHO. Follow up in 3 months unless EF is worse.  Check BMET today.   [name] Cogbill NP-C  12:16 PM

## 2019-01-20 DIAGNOSIS — Z6835 Body mass index (BMI) 35.0-35.9, adult: Secondary | ICD-10-CM | POA: Diagnosis not present

## 2019-01-20 DIAGNOSIS — I5042 Chronic combined systolic (congestive) and diastolic (congestive) heart failure: Secondary | ICD-10-CM | POA: Diagnosis not present

## 2019-01-20 DIAGNOSIS — I10 Essential (primary) hypertension: Secondary | ICD-10-CM | POA: Diagnosis not present

## 2019-01-20 DIAGNOSIS — E6609 Other obesity due to excess calories: Secondary | ICD-10-CM | POA: Diagnosis not present

## 2019-01-31 ENCOUNTER — Encounter (HOSPITAL_COMMUNITY): Payer: Self-pay

## 2019-01-31 ENCOUNTER — Other Ambulatory Visit: Payer: Self-pay

## 2019-01-31 ENCOUNTER — Emergency Department (HOSPITAL_COMMUNITY): Payer: BC Managed Care – PPO

## 2019-01-31 ENCOUNTER — Emergency Department (HOSPITAL_COMMUNITY)
Admission: EM | Admit: 2019-01-31 | Discharge: 2019-01-31 | Disposition: A | Discharge status: Home / Self Care | Payer: BC Managed Care – PPO | Attending: Emergency Medicine | Admitting: Emergency Medicine

## 2019-01-31 DIAGNOSIS — I5042 Chronic combined systolic (congestive) and diastolic (congestive) heart failure: Secondary | ICD-10-CM | POA: Insufficient documentation

## 2019-01-31 DIAGNOSIS — N183 Chronic kidney disease, stage 3 (moderate): Secondary | ICD-10-CM | POA: Diagnosis not present

## 2019-01-31 DIAGNOSIS — Z79899 Other long term (current) drug therapy: Secondary | ICD-10-CM | POA: Diagnosis not present

## 2019-01-31 DIAGNOSIS — I130 Hypertensive heart and chronic kidney disease with heart failure and stage 1 through stage 4 chronic kidney disease, or unspecified chronic kidney disease: Secondary | ICD-10-CM | POA: Insufficient documentation

## 2019-01-31 DIAGNOSIS — R079 Chest pain, unspecified: Secondary | ICD-10-CM

## 2019-01-31 DIAGNOSIS — I447 Left bundle-branch block, unspecified: Secondary | ICD-10-CM | POA: Diagnosis not present

## 2019-01-31 DIAGNOSIS — R0789 Other chest pain: Secondary | ICD-10-CM | POA: Diagnosis not present

## 2019-01-31 LAB — CBC
HCT: 42.9 % (ref 39.0–52.0)
Hemoglobin: 13.9 g/dL (ref 13.0–17.0)
MCH: 27.7 pg (ref 26.0–34.0)
MCHC: 32.4 g/dL (ref 30.0–36.0)
MCV: 85.6 fL (ref 80.0–100.0)
Platelets: 212 10*3/uL (ref 150–400)
RBC: 5.01 MIL/uL (ref 4.22–5.81)
RDW: 14.4 % (ref 11.5–15.5)
WBC: 5.4 10*3/uL (ref 4.0–10.5)
nRBC: 0 % (ref 0.0–0.2)

## 2019-01-31 LAB — BASIC METABOLIC PANEL
Anion gap: 9 (ref 5–15)
BUN: 17 mg/dL (ref 6–20)
CO2: 24 mmol/L (ref 22–32)
Calcium: 8.6 mg/dL — ABNORMAL LOW (ref 8.9–10.3)
Chloride: 105 mmol/L (ref 98–111)
Creatinine, Ser: 1.6 mg/dL — ABNORMAL HIGH (ref 0.61–1.24)
GFR calc Af Amer: >60 mL/min (ref >60)
GFR calc non Af Amer: 54 mL/min — ABNORMAL LOW (ref >60)
Glucose, Bld: 136 mg/dL — ABNORMAL HIGH (ref 70–99)
Potassium: 4 mmol/L (ref 3.5–5.1)
Sodium: 138 mmol/L (ref 135–145)

## 2019-01-31 LAB — TROPONIN I (HIGH SENSITIVITY)
Troponin I (High Sensitivity): 71 ng/L — ABNORMAL HIGH (ref <18)
Troponin I (High Sensitivity): 72 ng/L — ABNORMAL HIGH (ref <18)

## 2019-01-31 MED ORDER — SODIUM CHLORIDE 0.9% FLUSH: 3.0000 mL | Freq: Once | INTRAVENOUS | Status: DC

## 2019-01-31 NOTE — ED Triage Notes (Signed)
Pt reports that left chest began hurting last week . Seen by Hilma Favors on Monday and thought was a pulled muscle. Conts to hurt. Pain worse since Friday. Pain left chest and under arm and down to elbow

## 2019-01-31 NOTE — ED Provider Notes (Signed)
[facility] EMERGENCY DEPARTMENT Provider Note   CSN: [id number] Arrival date & time: 01/31/19  3716    History   Chief Complaint Chief Complaint  Patient presents with  . Chest Pain    HPI [name] [name] is a 38 y.o. male.     HPI Patient presents with chest pain.  Has had for around the last 4 days.  Seen by PCP and thought was a pulled muscle.  It is dull in his left upper chest.  Worse with movements.  States at work when he is moving heavy things it hurts.  Does not necessarily hurt with exertion.  No fevers or chills.  No cough.  No swelling in his legs.  History of CHF but has had a clean cath previously.  No abdominal pain.  No radiation to back.  Worse if he gets pressed on or he moves. Past Medical History:  Diagnosis Date  . Anxiety   . CHF (congestive heart failure) ([place])   . Chronic systolic heart failure ([place])   . CKD (chronic kidney disease) stage 2, GFR 60-89 ml/min   . Dyspnea    with value issues- "if I dont take my medication"  . Essential hypertension, benign   . Headache   . History of pneumonia   . Mitral regurgitation    Moderate  . Noncompliance   . Nonischemic cardiomyopathy ([place])    Normal coronaries May 2012, LVEF < 20%  . Pneumonia 2011  . S/P aortic valve repair 05/11/2017   Complex valvuloplasty including plication of left coronary leaflet and 106mm Biostable HAART ring annuloplasty    Patient Active Problem List   Diagnosis Date Noted  . S/P aortic valve repair 05/11/2017  . Aortic valve regurgitation   . Chronic systolic CHF (congestive heart failure) ([place])   . Hypertensive emergency 05/30/2015  . Essential hypertension 11/22/2014  . Shift work sleep disorder 10/30/2014  . Obstructive sleep apnea 10/30/2014  . Hypertensive crisis 09/26/2014  . Hypertensive urgency 06/13/2013  . CHF (congestive heart failure) ([place]) 06/13/2013  . CKD (chronic kidney disease) stage 3, GFR 30-59 ml/min (HCC) 06/13/2013  . Nonischemic cardiomyopathy  ([place]) 06/13/2013  . HTN (hypertension) 02/25/2012  . Chronic combined systolic and diastolic heart failure ([place]) 01/08/2012  . Elevated troponin 01/01/2012  . Hypokalemia 12/31/2011  . Chest pain 12/31/2011    Past Surgical History:  Procedure Laterality Date  . AORTIC VALVE REPAIR N/A 05/11/2017   Procedure: AORTIC VALVE REPAIR;  Surgeon: Rexene Alberts, MD;  Location: [facility];  Service: Open Heart Surgery;  Laterality: N/A;  . CARDIAC SURGERY    . RIGHT/LEFT HEART CATH AND CORONARY ANGIOGRAPHY N/A 04/20/2017   Procedure: RIGHT/LEFT HEART CATH AND CORONARY ANGIOGRAPHY;  Surgeon: Jolaine Artist, MD;  Location: [place] CV LAB;  Service: Cardiovascular;  Laterality: N/A;  . SURGERY SCROTAL / TESTICULAR     Testicular torsion  . TEE WITHOUT CARDIOVERSION N/A 04/09/2017   Procedure: TRANSESOPHAGEAL ECHOCARDIOGRAM (TEE);  Surgeon: Jolaine Artist, MD;  Location: [facility] ENDOSCOPY;  Service: Cardiovascular;  Laterality: N/A;  . TEE WITHOUT CARDIOVERSION N/A 05/11/2017   Procedure: TRANSESOPHAGEAL ECHOCARDIOGRAM (TEE);  Surgeon: Rexene Alberts, MD;  Location: [place];  Service: Open Heart Surgery;  Laterality: N/A;        Home Medications    Prior to Admission medications   Medication Sig Start Date End Date Taking? Authorizing Provider  acetaminophen (TYLENOL) 500 MG tablet Take 1,000 mg by mouth every 6 (six) hours as  needed for moderate pain or headache.    Yes [provider]  carvedilol (COREG) 25 MG tablet TAKE 1 TABLET BY MOUTH TWICE DAILY WITH MEALS Patient taking differently: Take 25 mg by mouth 2 (two) times daily with a meal.  01/17/19  Yes Bensimhon, Shaune Pascal, MD  diphenhydramine-acetaminophen (TYLENOL PM) 25-500 MG TABS tablet Take 1 tablet by mouth at bedtime as needed.   Yes [provider]  ENTRESTO 97-103 MG Take 1 tablet by mouth twice daily Patient taking differently: 1 tablet 2 (two) times daily.  01/17/19  Yes Bensimhon, Shaune Pascal, MD  furosemide  (LASIX) 40 MG tablet Take 2 tablets by mouth once daily 01/06/19  Yes Bensimhon, Shaune Pascal, MD  hydrALAZINE (APRESOLINE) 25 MG tablet TAKE 1 TABLET BY MOUTH THREE TIMES DAILY Patient taking differently: Take 25 mg by mouth 3 (three) times daily.  01/17/19  Yes Bensimhon, Shaune Pascal, MD    Family History Family History  Problem Relation Age of Onset  . Breast cancer Mother   . Stroke Father   . Hypertension Father     Social History Social History   Tobacco Use  . Smoking status: Never Smoker  . Smokeless tobacco: Never Used  Substance Use Topics  . Alcohol use: Yes    Alcohol/week: 0.0 standard drinks    Comment: occasional  . Drug use: No     Allergies   Patient has no known allergies.   Review of Systems Review of Systems  Constitutional: Negative for appetite change.  HENT: Negative for congestion.   Respiratory: Negative for shortness of breath.   Cardiovascular: Positive for chest pain.  Gastrointestinal: Negative for abdominal pain.  Genitourinary: Negative for flank pain.  Musculoskeletal: Negative for back pain.  Skin: Negative for rash.  Neurological: Negative for weakness.  Psychiatric/Behavioral: Negative for confusion.     Physical Exam Updated Vital Signs BP 139/69 (BP Location: Left Arm)   Pulse 80   Temp 97.9 F (36.6 C) (Oral)   Resp 19   Ht 5\' 10"  (1.778 m)   Wt 106.6 kg   SpO2 98%   BMI 33.72 kg/m   Physical Exam Vitals signs and nursing note reviewed.  HENT:     Head: Normocephalic.  Cardiovascular:     Rate and Rhythm: Regular rhythm.  Pulmonary:     Breath sounds: No wheezing or rhonchi.  Chest:     Chest wall: Tenderness present.     Comments: Tenderness to left upper anterior chest wall.  No rash.  No deformity. Abdominal:     Palpations: Abdomen is soft.  Musculoskeletal:     Right lower leg: No edema.     Left lower leg: No edema.  Skin:    General: Skin is warm.     Capillary Refill: Capillary refill takes less than 2  seconds.  Neurological:     General: No focal deficit present.      ED Treatments / Results  Labs (all labs ordered are listed, but only abnormal results are displayed) Labs Reviewed  BASIC METABOLIC PANEL - Abnormal; Notable for the following components:      Result Value   Glucose, Bld 136 (*)    Creatinine, Ser 1.60 (*)    Calcium 8.6 (*)    GFR calc non Af Amer 54 (*)    All other components within normal limits  TROPONIN I (HIGH SENSITIVITY) - Abnormal; Notable for the following components:   Troponin I (High Sensitivity) 71 (*)  All other components within normal limits  TROPONIN I (HIGH SENSITIVITY) - Abnormal; Notable for the following components:   Troponin I (High Sensitivity) 72 (*)    All other components within normal limits  CBC    EKG EKG Interpretation  Date/Time:  Tuesday January 31 2019 09:34:56 EDT Ventricular Rate:  77 PR Interval:    QRS Duration: 118 QT Interval:  388 QTC Calculation: 440 R Axis:   46 Text Interpretation:  Sinus rhythm Incomplete left bundle branch block LVH with secondary repolarization abnormality Confirmed by Davonna Belling [phone]) on 01/31/2019 9:39:58 AM   Radiology Dg Chest 2 View  Result Date: 01/31/2019 CLINICAL DATA:  Anterior chest pain for the past 2 weeks. EXAM: CHEST - 2 VIEW COMPARISON:  None. FINDINGS: Stable mildly enlarged cardiac silhouette and median sternotomy wires with anterior mediastinal surgical clips. Clear lungs with normal vascularity. Normal appearing bones. IMPRESSION: No acute abnormality. Stable mild cardiomegaly. Electronically Signed   By: Claudie Revering M.D.   On: 01/31/2019 10:57    Procedures Procedures (including critical care time)  Medications Ordered in ED Medications  sodium chloride flush (NS) 0.9 % injection 3 mL (has no administration in time range)     Initial Impression / Assessment and Plan / ED Course  I have reviewed the triage vital signs and the nursing notes.  Pertinent  labs & imaging results that were available during my care of the patient were reviewed by me and considered in my medical decision making (see chart for details).        Patient with chest pain.  Reproducible on upper chest.  Known CHF but has had clean cath in the past.  Troponin mildly elevated.  Troponin stable on recheck.  Discussed with Dr. Harl Bowie.  Will have patient follow-up as an outpatient.  Final Clinical Impressions(s) / ED Diagnoses   Final diagnoses:  Nonspecific chest pain    ED Discharge Orders    None       Davonna Belling, MD 01/31/19 1504

## 2019-02-13 ENCOUNTER — Other Ambulatory Visit: Payer: Self-pay

## 2019-02-13 ENCOUNTER — Ambulatory Visit (HOSPITAL_COMMUNITY)
Admission: RE | Admit: 2019-02-13 | Discharge: 2019-02-13 | Disposition: A | Discharge status: Home / Self Care | Payer: BC Managed Care – PPO | Source: Ambulatory Visit | Attending: Cardiology | Admitting: Cardiology

## 2019-02-13 DIAGNOSIS — I5022 Chronic systolic (congestive) heart failure: Secondary | ICD-10-CM | POA: Insufficient documentation

## 2019-02-13 NOTE — Progress Notes (Deleted)
*  PRELIMINARY RESULTS* Echocardiogram 2D Echocardiogram has been performed.  [name] [name] 02/13/2019, 1:11 PM

## 2019-02-15 DIAGNOSIS — Z0183 Encounter for blood typing: Secondary | ICD-10-CM | POA: Diagnosis not present

## 2019-02-15 DIAGNOSIS — Z113 Encounter for screening for infections with a predominantly sexual mode of transmission: Secondary | ICD-10-CM | POA: Diagnosis not present

## 2019-02-15 DIAGNOSIS — Z1159 Encounter for screening for other viral diseases: Secondary | ICD-10-CM | POA: Diagnosis not present

## 2019-02-15 DIAGNOSIS — Z31440 Encounter of male for testing for genetic disease carrier status for procreative management: Secondary | ICD-10-CM | POA: Diagnosis not present

## 2019-02-20 ENCOUNTER — Telehealth (HOSPITAL_COMMUNITY): Payer: Self-pay

## 2019-02-20 NOTE — Telephone Encounter (Signed)
Patient aware of results of echo and appreciative

## 2019-02-20 NOTE — Telephone Encounter (Signed)
-----   Message from Larey Dresser, MD sent at 02/13/2019  3:13 PM EDT ----- EF 45%, aortic valve repair with moderate aortic insufficiency.

## 2019-03-03 ENCOUNTER — Other Ambulatory Visit (HOSPITAL_COMMUNITY): Payer: Self-pay | Admitting: Internal Medicine

## 2019-09-13 ENCOUNTER — Telehealth (HOSPITAL_COMMUNITY): Payer: Self-pay | Admitting: Cardiology

## 2019-09-13 NOTE — Telephone Encounter (Signed)
Patient called with concerns of CP over the weekend that has since subsided and increased palpitations. Reports increase in weight, medication compliance, mildly more SOB, denies swelling/edema.  Add on appt available 09/14/19 with PA. Patient appreciative of add on appt. Advised to report to ER if CP returns/worsens.

## 2019-09-14 ENCOUNTER — Encounter (HOSPITAL_COMMUNITY): Payer: Self-pay

## 2019-09-14 ENCOUNTER — Ambulatory Visit (HOSPITAL_COMMUNITY)
Admission: RE | Admit: 2019-09-14 | Discharge: 2019-09-14 | Disposition: A | Discharge status: Home / Self Care | Payer: BC Managed Care – PPO | Source: Ambulatory Visit | Attending: Cardiology | Admitting: Cardiology

## 2019-09-14 ENCOUNTER — Other Ambulatory Visit: Payer: Self-pay

## 2019-09-14 VITALS — BP 140/82 | HR 71 | Wt 237.5 lb

## 2019-09-14 DIAGNOSIS — I428 Other cardiomyopathies: Secondary | ICD-10-CM | POA: Diagnosis not present

## 2019-09-14 DIAGNOSIS — R0789 Other chest pain: Secondary | ICD-10-CM

## 2019-09-14 DIAGNOSIS — I351 Nonrheumatic aortic (valve) insufficiency: Secondary | ICD-10-CM

## 2019-09-14 DIAGNOSIS — R002 Palpitations: Secondary | ICD-10-CM | POA: Diagnosis not present

## 2019-09-14 DIAGNOSIS — Z79899 Other long term (current) drug therapy: Secondary | ICD-10-CM | POA: Diagnosis not present

## 2019-09-14 DIAGNOSIS — I5022 Chronic systolic (congestive) heart failure: Secondary | ICD-10-CM | POA: Diagnosis not present

## 2019-09-14 DIAGNOSIS — Z9889 Other specified postprocedural states: Secondary | ICD-10-CM

## 2019-09-14 DIAGNOSIS — I130 Hypertensive heart and chronic kidney disease with heart failure and stage 1 through stage 4 chronic kidney disease, or unspecified chronic kidney disease: Secondary | ICD-10-CM | POA: Insufficient documentation

## 2019-09-14 DIAGNOSIS — N183 Chronic kidney disease, stage 3 unspecified: Secondary | ICD-10-CM | POA: Insufficient documentation

## 2019-09-14 LAB — BASIC METABOLIC PANEL
Anion gap: 10 (ref 5–15)
BUN: 13 mg/dL (ref 6–20)
CO2: 22 mmol/L (ref 22–32)
Calcium: 8.9 mg/dL (ref 8.9–10.3)
Chloride: 105 mmol/L (ref 98–111)
Creatinine, Ser: 1.47 mg/dL — ABNORMAL HIGH (ref 0.61–1.24)
GFR calc Af Amer: >60 mL/min (ref >60)
GFR calc non Af Amer: 60 mL/min — ABNORMAL LOW (ref >60)
Glucose, Bld: 143 mg/dL — ABNORMAL HIGH (ref 70–99)
Potassium: 3.9 mmol/L (ref 3.5–5.1)
Sodium: 137 mmol/L (ref 135–145)

## 2019-09-14 LAB — CBC
HCT: 47.7 % (ref 39.0–52.0)
Hemoglobin: 15.3 g/dL (ref 13.0–17.0)
MCH: 27.2 pg (ref 26.0–34.0)
MCHC: 32.1 g/dL (ref 30.0–36.0)
MCV: 84.7 fL (ref 80.0–100.0)
Platelets: 218 10*3/uL (ref 150–400)
RBC: 5.63 MIL/uL (ref 4.22–5.81)
RDW: 14.5 % (ref 11.5–15.5)
WBC: 5.1 10*3/uL (ref 4.0–10.5)
nRBC: 0 % (ref 0.0–0.2)

## 2019-09-14 LAB — MAGNESIUM: Magnesium: 2 mg/dL (ref 1.7–2.4)

## 2019-09-14 LAB — TSH: TSH: 1.652 u[IU]/mL (ref 0.350–4.500)

## 2019-09-14 NOTE — Progress Notes (Signed)
ReDS Vest / Clip - 09/14/19 1000      ReDS Vest / Clip   Station Marker  D    Ruler Value  41    ReDS Value Range  Moderate volume overload    ReDS Actual Value  30    Anatomical Comments  sitting

## 2019-09-14 NOTE — Progress Notes (Signed)
Advanced Heart Failure Clinic Note   Patient ID: [name] [name], male   DOB: 05-17-1981, 39 y.o.   MRN: [id number]  Primary PCP: [facility]  Primary Cardiologist: Dr. Haroldine Laws  Reason for Visit: Palpitations and chest pain   HPI: [name] [name] is a 39 y.o.male with systolic HF due to  NICM, hypertension and CKD stage II-III (baseline Cr 1.5-1.7).   He was first diagnosed with HF in 2012 and his EF recovered and was instructed to stop taking carvedilol, spironolactone, and lasix by previous cardiologist. Admitted to [facility] 4/78/29 for acute systolic heart failure. 12/31/11 ProBNP 3777. HIV nonreactive. Thyroid panel normal. Renal ultrasound was negative for obstruction. EF 20%. Discharge weight 193 lbs. Cath with normal coronaries.  Admitted 05/30/2015 with HTN urgency with troponin elevation likely consistent with myocardial strain/demand ischemia in the setting of medication non compliance, having run out 2-3 weeks ago and not refilling them. Placed back on his coreg, hydralazine, and spiro and was symptomatically stable on discharge. No ACE/ARB with CKD and does not tolerate nitrates with headaches. Discharge weight 193 lbs.  Underwent TEE in 9/18 with severe AI. EF 40-45%. Cath with normal coronaries. S/P AV repair 05/11/2017 with Dr. Roxy Manns.  PYP scan negative 10/18  Had repeat Echo 02/13/19. EF 45% w/ moderate AI. EF out of range for ICD.  He presents to clinic today for new palpitations and chest pain. Symptoms present for roughly 1 week. Palpitations intermittent but occur almost daily. Can last 1-2 min at a time but no associated dizziness, syncope/ near syncope. Occasionally feels SOB w/ palpitations but no exertional dyspnea. Can walk up stairs w/o SOB. He notes gradual 10-12 lb wt gain since last August. No LEE. Reports full compliance w/ meds. Denies stimulants. No excess caffeine or ETOH. Denies fever, chills, n/v/d.   His CP is non exertional. Feels like a cramp in left upper  chest. Non radiating. Last occurred 3 days ago. Works at [employer]. Required to do heavy lifting. Currently CP free.   EKG today shows NSR, LAD, LVH 77 bpm.   ReDs Clip 30%    ECHO 06/13/13: EF 20-25% ECHO 09/26/2014: EF 40%  ECHO 05/31/15 EF 20-25% Echo 11/17 EF 45-50% ECHO 03/2017 EF ~45% Severe AI  ECHO 06/23/2017: EF 45-50% . Grade II DD ECHO 2019 EF 30-35%  Moderate aortic regurgitation. RV moderately reduced.  Echo 02/2019, EF 45%, moderate AI  Review of systems complete and found to be negative unless listed in HPI.   Past Medical History:  Diagnosis Date  . Anxiety   . CHF (congestive heart failure) ([place])   . Chronic systolic heart failure ([place])   . CKD (chronic kidney disease) stage 2, GFR 60-89 ml/min   . Dyspnea    with value issues- "if I dont take my medication"  . Essential hypertension, benign   . Headache   . History of pneumonia   . Mitral regurgitation    Moderate  . Noncompliance   . Nonischemic cardiomyopathy ([place])    Normal coronaries May 2012, LVEF < 20%  . Pneumonia 2011  . S/P aortic valve repair 05/11/2017   Complex valvuloplasty including plication of left coronary leaflet and 44mm Biostable HAART ring annuloplasty    Current Outpatient Medications  Medication Sig Dispense Refill  . acetaminophen (TYLENOL) 500 MG tablet Take 1,000 mg by mouth every 6 (six) hours as needed for moderate pain or headache.     . carvedilol (COREG) 25 MG tablet TAKE 1 TABLET BY MOUTH  TWICE DAILY WITH MEALS 180 tablet 3  . diphenhydramine-acetaminophen (TYLENOL PM) 25-500 MG TABS tablet Take 1 tablet by mouth at bedtime as needed.    Marland Kitchen ENTRESTO 97-103 MG Take 1 tablet by mouth twice daily 180 tablet 3  . furosemide (LASIX) 40 MG tablet Take 2 tablets by mouth once daily 180 tablet 3  . hydrALAZINE (APRESOLINE) 25 MG tablet TAKE 1 TABLET BY MOUTH THREE TIMES DAILY 270 tablet 3   No current facility-administered medications for this encounter.    Vitals:    09/14/19 0928  BP: 140/82  Pulse: 71  SpO2: 99%  Weight: 107.7 kg (237 lb 8 oz)   Wt Readings from Last 3 Encounters:  09/14/19 107.7 kg (237 lb 8 oz)  01/31/19 106.6 kg (235 lb)  01/19/19 105.2 kg (232 lb)    PHYSICAL EXAM: ReDs Clip 30% General:  Well appearing, moderately obese AAM. No respiratory difficulty HEENT: normal Neck: supple. no JVD. Carotids 2+ bilat; no bruits. No lymphadenopathy or thyromegaly appreciated. Cor: PMI nondisplaced. Regular rate & rhythm. 3/6 diastolic murmur (AI) loudest at RUSB Lungs: clear Abdomen: soft, nontender, nondistended. No hepatosplenomegaly. No bruits or masses. Good bowel sounds. Extremities: no cyanosis, clubbing, rash, edema Neuro: alert & oriented x 3, cranial nerves grossly intact. moves all 4 extremities w/o difficulty. Affect pleasant.   EKG today shows NSR, LAD, LVH 77 bpm.   ASSESSMENT & PLAN: 1) Chronic systolic HF: NICM,  ECHO ~62% in 2013. EF 45-50% in 2016. Suspect due to ongoing AI +/- HTN - s/p AoV repair 10/18. F/u echo 12/18 with EF 45-50% moderate AI - ECHO 2019 EF 30-35% Mod AI.  - ECHO 02/2019 EF 45% (out of range for ICD)  -PYP scan negative for amyloid.  - NYHA II. Euvolemic by exam and by ReDs Clip, 30%.  - Continue Lasix 40 mg qd - Continue Coreg 25 mg BID - Continue Entresto 97/103 mg BID - Continue 25 mg mg hydralazine tid (had HAs with Imdur in past) - Check BMP today   2) HTN :  -Stable on current regimen.  3) CKD stage III:  (baseline Cr 1.5-1.7)  - Check BMP today   4) S/P AV repair 05/01/2017 - moderate AI on ECHO 02/2020 - Now w/ palpitations and atypical CP. Will repeat echo to reassess status of valve  5) Chest Pain - EKG ok. No ST changes. Had [place] in 2018 showing normal coronaries. - CP is not c/w ischemic etiology. Suspect likely musculoskeletal chest pain. No ischemic w/u. Plan echo per above.   6. Palpitations   - EKG shows NSR. HR in the 70s - Will order Zio patch x 7 days - Repeat  Echo  - Check BMP, Mg, CBC and TSH   F/u in 2 weeks   Marquett Bertoli PA-C 9:38 AM

## 2019-09-14 NOTE — Patient Instructions (Signed)
Lab work done today. We will notify you of any abnormal lab work. No news is good news!  Your physician has requested that you have an echocardiogram. Echocardiography is a painless test that uses sound waves to create images of your heart. It provides your doctor with information about the size and shape of your heart and how well your heart's chambers and valves are working. This procedure takes approximately one hour. There are no restrictions for this procedure.  Your provider has recommended that  you wear a Zio Patch for 7 days.  This monitor will record your heart rhythm for our review.  IF you have any symptoms while wearing the monitor please press the button.  If you have any issues with the patch or you notice a red or orange light on it please call the company at [phone].  Once you remove the patch please mail it back to the company as soon as possible so we can get the results.   Please follow up with the [facility] in 2 weeks.  At the [facility], you and your health needs are our priority. As part of our continuing mission to provide you with exceptional heart care, we have created designated Provider Care Teams. These Care Teams include your primary Cardiologist (physician) and Advanced Practice Providers (APPs- Physician Assistants and Nurse Practitioners) who all work together to provide you with the care you need, when you need it.   You may see any of the following providers on your designated Care Team at your next follow up: Marland Kitchen Dr Glori Bickers . Dr Loralie Champagne . Darrick Grinder, NP . Lyda Jester, PA . Audry Riles, PharmD   Please be sure to bring in all your medications bottles to every appointment.

## 2019-09-28 ENCOUNTER — Ambulatory Visit (HOSPITAL_BASED_OUTPATIENT_CLINIC_OR_DEPARTMENT_OTHER)
Admission: RE | Admit: 2019-09-28 | Discharge: 2019-09-28 | Disposition: A | Discharge status: Home / Self Care | Payer: BC Managed Care – PPO | Source: Ambulatory Visit | Attending: Cardiology | Admitting: Cardiology

## 2019-09-28 ENCOUNTER — Other Ambulatory Visit: Payer: Self-pay

## 2019-09-28 ENCOUNTER — Encounter (HOSPITAL_COMMUNITY): Payer: Self-pay | Admitting: *Deleted

## 2019-09-28 ENCOUNTER — Encounter (HOSPITAL_COMMUNITY): Payer: Self-pay

## 2019-09-28 ENCOUNTER — Ambulatory Visit (HOSPITAL_COMMUNITY)
Admission: RE | Admit: 2019-09-28 | Discharge: 2019-09-28 | Disposition: A | Discharge status: Home / Self Care | Payer: BC Managed Care – PPO | Source: Ambulatory Visit | Attending: Cardiology | Admitting: Cardiology

## 2019-09-28 VITALS — BP 142/85 | HR 85 | Wt 237.0 lb

## 2019-09-28 DIAGNOSIS — I428 Other cardiomyopathies: Secondary | ICD-10-CM | POA: Insufficient documentation

## 2019-09-28 DIAGNOSIS — R002 Palpitations: Secondary | ICD-10-CM

## 2019-09-28 DIAGNOSIS — R008 Other abnormalities of heart beat: Secondary | ICD-10-CM | POA: Insufficient documentation

## 2019-09-28 DIAGNOSIS — Z79899 Other long term (current) drug therapy: Secondary | ICD-10-CM | POA: Diagnosis not present

## 2019-09-28 DIAGNOSIS — Z9889 Other specified postprocedural states: Secondary | ICD-10-CM | POA: Diagnosis not present

## 2019-09-28 DIAGNOSIS — I5022 Chronic systolic (congestive) heart failure: Secondary | ICD-10-CM

## 2019-09-28 DIAGNOSIS — Z7901 Long term (current) use of anticoagulants: Secondary | ICD-10-CM | POA: Diagnosis not present

## 2019-09-28 DIAGNOSIS — N183 Chronic kidney disease, stage 3 unspecified: Secondary | ICD-10-CM | POA: Diagnosis not present

## 2019-09-28 DIAGNOSIS — I130 Hypertensive heart and chronic kidney disease with heart failure and stage 1 through stage 4 chronic kidney disease, or unspecified chronic kidney disease: Secondary | ICD-10-CM | POA: Diagnosis not present

## 2019-09-28 NOTE — Progress Notes (Signed)
  Echocardiogram 2D Echocardiogram has been performed.  [name] [name] 09/28/2019, 8:52 AM

## 2019-09-28 NOTE — Progress Notes (Signed)
Advanced Heart Failure Clinic Note   Patient ID: [name] [name], male   DOB: 17-Jul-1980, 39 y.o.   MRN: [id number]  Primary PCP: [name] [name]  Primary Cardiologist: Dr. Haroldine [name]  Reason for Visit: F/u for Palpitations  HPI: [name] is a 39 y.o.male with systolic HF due to  NICM, hypertension and CKD stage II-III (baseline Cr 1.5-1.7).   He was first diagnosed with HF in 2012 and his EF recovered and was instructed to stop taking carvedilol, spironolactone, and lasix by previous cardiologist. Admitted to [facility] 3/50/09 for acute systolic heart failure. 12/31/11 ProBNP 3777. HIV nonreactive. Thyroid panel normal. Renal ultrasound was negative for obstruction. EF 20%. Discharge weight 193 lbs. Cath with normal coronaries.  Admitted 05/30/2015 with HTN urgency with troponin elevation likely consistent with myocardial strain/demand ischemia in the setting of medication non compliance, having run out 2-3 weeks ago and not refilling them. Placed back on his coreg, hydralazine, and spiro and was symptomatically stable on discharge. No ACE/ARB with CKD and does not tolerate nitrates with headaches. Discharge weight 193 lbs.  Underwent TEE in 9/18 with severe AI. EF 40-45%. Cath with normal coronaries. S/P AV repair 05/11/2017 with Dr. Roxy [name].  PYP scan negative 10/18  Had repeat Echo 02/13/19. EF 45% w/ moderate AI. EF out of range for ICD.  Seen in clinic 09/14/19 and complained of intermittent palpitations but no associated dizziness, syncope/ near syncope. Occasionally felt SOB w/ palpitations but no exertional dyspnea. Able to walk up stairs w/o SOB. He was euvolemic on exam. ReDs clip reading was 30%. His CP was described as non exertional. Felt like a cramp in left upper chest. Non radiating. Works at [employer]. Required to do heavy lifting. EKG showed NSR w/ no ischemic abnormality. His CP was felt to be musculoskeletal and noncardiac.   Labs were obtained at visit, including BMP, Mg, TSH  and CBC, all of which were unremarkable. He was ordered to wear a Zio patch and get repeat echo.  Zio path showed mainly NSR w/ 1 brief run of NSVT (4 beats), 4 brief runs of SVT and rare PVCs < 1% burden. See complete report below. 2D echo completed earlier today. Interpretation pending.   Today in f/u, he reports feeling better. Notes less palpitations. Now rare. No recurrent CP. Continues to deny dyspnea. No exertional symptoms. Denies syncope/ near syncope. Volume status ok. Wt stable. HR 85 bpm.     Zio patch results Preliminary Findings Final Interpretation Patient had a min HR of 58 bpm, max HR of 174 bpm, and avg HR of 76 bpm. Predominant underlying rhythm was Sinus Rhythm. 1 run of Ventricular Tachycardia occurred lasting 4 beats with a max rate of 174 bpm (avg 170 bpm). 4 Supraventricular Tachycardia runs occurred, the run with the fastest interval lasting 4 beats with a max rate of 158 bpm, the longest lasting 9 beats with an avg rate of 133 bpm. Isolated SVEs were rare (<1.0%), SVE Couplets were rare (<1.0%), and no SVE Triplets were present. Isolated VEs were rare (<1.0%), VE Couplets were rare (<1.0%), and no VE Triplets were present. Ventricular Bigeminy and Trigeminy were present.   ECHO 06/13/13: EF 20-25% ECHO 09/26/2014: EF 40%  ECHO 05/31/15 EF 20-25% Echo 11/17 EF 45-50% ECHO 03/2017 EF ~45% Severe AI  ECHO 06/23/2017: EF 45-50% . Grade II DD ECHO 2019 EF 30-35%  Moderate aortic regurgitation. RV moderately reduced.  Echo 02/2019, EF 45%, moderate AI  Review of systems complete and found to  be negative unless listed in HPI.   Past Medical History:  Diagnosis Date  . Anxiety   . CHF (congestive heart failure) ([place])   . Chronic systolic heart failure ([place])   . CKD (chronic kidney disease) stage 2, GFR 60-89 ml/min   . Dyspnea    with value issues- "if I dont take my medication"  . Essential hypertension, benign   . Headache   . History of pneumonia   . Mitral  regurgitation    Moderate  . Noncompliance   . Nonischemic cardiomyopathy ([place])    Normal coronaries May 2012, LVEF < 20%  . Pneumonia 2011  . S/P aortic valve repair 05/11/2017   Complex valvuloplasty including plication of left coronary leaflet and 34mm Biostable HAART ring annuloplasty    Current Outpatient Medications  Medication Sig Dispense Refill  . acetaminophen (TYLENOL) 500 MG tablet Take 1,000 mg by mouth every 6 (six) hours as needed for moderate pain or headache.     . carvedilol (COREG) 25 MG tablet TAKE 1 TABLET BY MOUTH TWICE DAILY WITH MEALS 180 tablet 3  . diphenhydramine-acetaminophen (TYLENOL PM) 25-500 MG TABS tablet Take 1 tablet by mouth at bedtime as needed.    Marland Kitchen ENTRESTO 97-103 MG Take 1 tablet by mouth twice daily 180 tablet 3  . furosemide (LASIX) 40 MG tablet Take 2 tablets by mouth once daily 180 tablet 3  . hydrALAZINE (APRESOLINE) 25 MG tablet TAKE 1 TABLET BY MOUTH THREE TIMES DAILY 270 tablet 3   No current facility-administered medications for this encounter.    Vitals:   09/28/19 0905  BP: (!) 142/85  Pulse: 85  SpO2: 96%  Weight: 107.5 kg (237 lb)   Wt Readings from Last 3 Encounters:  09/28/19 107.5 kg (237 lb)  09/14/19 107.7 kg (237 lb 8 oz)  01/31/19 106.6 kg (235 lb)    PHYSICAL EXAM: General:  Well appearing, moderately obese AAM. No respiratory difficulty HEENT: normal Neck: supple. no JVD. Carotids 2+ bilat; no bruits. No lymphadenopathy or thyromegaly appreciated. Cor: PMI nondisplaced. Regular rate & rhythm. 3/6 diastolic murmur (AI) at R/LUSB Lungs: clear Abdomen: soft, nontender, nondistended. No hepatosplenomegaly. No bruits or masses. Good bowel sounds. Extremities: no cyanosis, clubbing, rash, edema Neuro: alert & oriented x 3, cranial nerves grossly intact. moves all 4 extremities w/o difficulty. Affect pleasant.   ASSESSMENT & PLAN: 1) Chronic systolic HF: NICM,  ECHO ~94% in 2013. EF 45-50% in 2016. Suspect due to  ongoing AI +/- HTN - s/p AoV repair 10/18. F/u echo 12/18 with EF 45-50% moderate AI - ECHO 2019 EF 30-35% Mod AI.  - ECHO 02/2019 EF 45% (out of range for ICD)  -PYP scan negative for amyloid.  - NYHA Class II. Euvolemic by exam. Wt stable  - Continue Lasix 40 mg qd - Continue Coreg 25 mg BID - Continue Entresto 97/103 mg BID - Continue 25 mg mg hydralazine tid (had HAs with Imdur in past) - BP mildly elevated but just took AMs just prior to appt  - Check BMP today  - Awaiting results for repeat echo. If EF has dropped further, will retry low dose Imdur  2) HTN :  - BP mildly elevated but just took AMs just prior to appt (142/85) - Will check EF on repeat Echo. If EF lower, will add Imdur to hydralazine   3) CKD stage III:  (baseline Cr 1.5-1.7)  - Check BMP today   4) S/P AV repair 05/01/2017 - moderate AI  on ECHO 02/2020 - echo repeated today, interpretation pending    7. Palpitations   - recent labs and Zio patch unremarkable, as outlined above - Echo pending  - Symptoms have improved since last visit.  - Continue Coreg 25 mg bid   F/u in 2-3 months. I will review echo results. If significant findings on echo (wosened systolic function or AI), will arrange f/u sooner.    Zettie Gootee PA-C 9:24 AM

## 2019-09-28 NOTE — Progress Notes (Signed)
Received STD forms from [employer] for pt, he states he was out of work for only a week 3/1 through 3/9 and returned to work on 3/10, forms completed, signed by Dr Haroldine Laws and faxed back to [employer] at [phone]

## 2019-09-28 NOTE — Patient Instructions (Signed)
It was great to see you today! No medication changes are needed at this time.  Your physician recommends that you schedule a follow-up appointment in: 3 months with Dr Bensimhon  Do the following things EVERYDAY: 1) Weigh yourself in the morning before breakfast. Write it down and keep it in a log. 2) Take your medicines as prescribed 3) Eat low salt foods--Limit salt (sodium) to 2000 mg per day.  4) Stay as active as you can everyday 5) Limit all fluids for the day to less than 2 liters  At the [facility], you and your health needs are our priority. As part of our continuing mission to provide you with exceptional heart care, we have created designated Provider Care Teams. These Care Teams include your primary Cardiologist (physician) and Advanced Practice Providers (APPs- Physician Assistants and Nurse Practitioners) who all work together to provide you with the care you need, when you need it.   You may see any of the following providers on your designated Care Team at your next follow up: . Dr Daniel Bensimhon . Dr Dalton McLean . Amy Clegg, NP . Brittainy Simmons, PA . Lauren Kemp, PharmD   Please be sure to bring in all your medications bottles to every appointment.     

## 2019-10-05 ENCOUNTER — Emergency Department (HOSPITAL_COMMUNITY): Payer: BC Managed Care – PPO

## 2019-10-05 ENCOUNTER — Other Ambulatory Visit: Payer: Self-pay

## 2019-10-05 ENCOUNTER — Emergency Department (HOSPITAL_COMMUNITY)
Admission: EM | Admit: 2019-10-05 | Discharge: 2019-10-05 | Disposition: A | Discharge status: Home / Self Care | Payer: BC Managed Care – PPO | Attending: Emergency Medicine | Admitting: Emergency Medicine

## 2019-10-05 ENCOUNTER — Encounter (HOSPITAL_COMMUNITY): Payer: Self-pay | Admitting: *Deleted

## 2019-10-05 DIAGNOSIS — M79672 Pain in left foot: Secondary | ICD-10-CM | POA: Diagnosis not present

## 2019-10-05 DIAGNOSIS — N183 Chronic kidney disease, stage 3 unspecified: Secondary | ICD-10-CM | POA: Diagnosis not present

## 2019-10-05 DIAGNOSIS — I130 Hypertensive heart and chronic kidney disease with heart failure and stage 1 through stage 4 chronic kidney disease, or unspecified chronic kidney disease: Secondary | ICD-10-CM | POA: Diagnosis not present

## 2019-10-05 DIAGNOSIS — M7989 Other specified soft tissue disorders: Secondary | ICD-10-CM | POA: Diagnosis not present

## 2019-10-05 DIAGNOSIS — I5042 Chronic combined systolic (congestive) and diastolic (congestive) heart failure: Secondary | ICD-10-CM | POA: Diagnosis not present

## 2019-10-05 DIAGNOSIS — Z79899 Other long term (current) drug therapy: Secondary | ICD-10-CM | POA: Insufficient documentation

## 2019-10-05 MED ORDER — NAPROXEN 375 MG PO TABS: 375.0000 mg | ORAL_TABLET | Freq: Two times a day (BID) | ORAL | 0 refills | Status: DC

## 2019-10-05 NOTE — ED Triage Notes (Signed)
Pt with left foot pain since waking up yesterday morning. Mild swelling per pt, pt denies any injury to it. Unable to put weight on left foot.

## 2019-10-05 NOTE — ED Provider Notes (Signed)
[facility] EMERGENCY DEPARTMENT Provider Note   CSN: [id number] Arrival date & time: 10/05/19  1547     History Chief Complaint  Patient presents with  . Foot Pain    [name] [name] is a 39 y.o. male.  Patient reporting left foot pain since yesterday morning. No known injury. He noticed the pain when he attempted to get out of bed. Pain localized along the medial aspect overlying the arch. He is unable to bear weight on the foot due to pain.  The history is provided by the patient. No language interpreter was used.  Foot Pain The current episode started yesterday. The problem occurs constantly. The problem has not changed since onset.The symptoms are aggravated by walking.       Past Medical History:  Diagnosis Date  . Anxiety   . CHF (congestive heart failure) ([place])   . Chronic systolic heart failure ([place])   . CKD (chronic kidney disease) stage 2, GFR 60-89 ml/min   . Dyspnea    with value issues- "if I dont take my medication"  . Essential hypertension, benign   . Headache   . History of pneumonia   . Mitral regurgitation    Moderate  . Noncompliance   . Nonischemic cardiomyopathy ([place])    Normal coronaries May 2012, LVEF < 20%  . Pneumonia 2011  . S/P aortic valve repair 05/11/2017   Complex valvuloplasty including plication of left coronary leaflet and 29mm Biostable HAART ring annuloplasty    Patient Active Problem List   Diagnosis Date Noted  . S/P aortic valve repair 05/11/2017  . Aortic valve regurgitation   . Chronic systolic CHF (congestive heart failure) ([place])   . Hypertensive emergency 05/30/2015  . Essential hypertension 11/22/2014  . Shift work sleep disorder 10/30/2014  . Obstructive sleep apnea 10/30/2014  . Hypertensive crisis 09/26/2014  . Hypertensive urgency 06/13/2013  . CHF (congestive heart failure) ([place]) 06/13/2013  . CKD (chronic kidney disease) stage 3, GFR 30-59 ml/min (HCC) 06/13/2013  . Nonischemic cardiomyopathy ([place])  06/13/2013  . HTN (hypertension) 02/25/2012  . Chronic combined systolic and diastolic heart failure ([place]) 01/08/2012  . Elevated troponin 01/01/2012  . Hypokalemia 12/31/2011  . Chest pain 12/31/2011    Past Surgical History:  Procedure Laterality Date  . AORTIC VALVE REPAIR N/A 05/11/2017   Procedure: AORTIC VALVE REPAIR;  Surgeon: Rexene Alberts, MD;  Location: [place];  Service: Open Heart Surgery;  Laterality: N/A;  . CARDIAC SURGERY    . RIGHT/LEFT HEART CATH AND CORONARY ANGIOGRAPHY N/A 04/20/2017   Procedure: RIGHT/LEFT HEART CATH AND CORONARY ANGIOGRAPHY;  Surgeon: Jolaine Artist, MD;  Location: [place] CV LAB;  Service: Cardiovascular;  Laterality: N/A;  . SURGERY SCROTAL / TESTICULAR     Testicular torsion  . TEE WITHOUT CARDIOVERSION N/A 04/09/2017   Procedure: TRANSESOPHAGEAL ECHOCARDIOGRAM (TEE);  Surgeon: Jolaine Artist, MD;  Location: [facility] ENDOSCOPY;  Service: Cardiovascular;  Laterality: N/A;  . TEE WITHOUT CARDIOVERSION N/A 05/11/2017   Procedure: TRANSESOPHAGEAL ECHOCARDIOGRAM (TEE);  Surgeon: Rexene Alberts, MD;  Location: [place];  Service: Open Heart Surgery;  Laterality: N/A;       Family History  Problem Relation Age of Onset  . Breast cancer Mother   . Stroke Father   . Hypertension Father     Social History   Tobacco Use  . Smoking status: Never Smoker  . Smokeless tobacco: Never Used  Substance Use Topics  . Alcohol use: Yes    Alcohol/week:  0.0 standard drinks    Comment: occasional  . Drug use: No    Home Medications Prior to Admission medications   Medication Sig Start Date End Date Taking? Authorizing Provider  acetaminophen (TYLENOL) 500 MG tablet Take 1,000 mg by mouth every 6 (six) hours as needed for moderate pain or headache.     [provider]  carvedilol (COREG) 25 MG tablet TAKE 1 TABLET BY MOUTH TWICE DAILY WITH MEALS 03/03/19   Bensimhon, Shaune Pascal, MD  diphenhydramine-acetaminophen (TYLENOL PM) 25-500 MG TABS  tablet Take 1 tablet by mouth at bedtime as needed.    [provider]  ENTRESTO 97-103 MG Take 1 tablet by mouth twice daily 03/03/19   Bensimhon, Shaune Pascal, MD  furosemide (LASIX) 40 MG tablet Take 2 tablets by mouth once daily 03/03/19   Bensimhon, Shaune Pascal, MD  hydrALAZINE (APRESOLINE) 25 MG tablet TAKE 1 TABLET BY MOUTH THREE TIMES DAILY 03/03/19   Bensimhon, Shaune Pascal, MD    Allergies    Patient has no known allergies.  Review of Systems   Review of Systems  Musculoskeletal: Positive for myalgias.  All other systems reviewed and are negative.   Physical Exam Updated Vital Signs BP 121/67 (BP Location: Right Arm)   Pulse 86   Temp 98.5 F (36.9 C)   Resp 20   Ht 5\' 10"  (1.778 m)   Wt 106.6 kg   SpO2 98%   BMI 33.72 kg/m   Physical Exam Vitals and nursing note reviewed.  HENT:     Head: Normocephalic.     Mouth/Throat:     Mouth: Mucous membranes are moist.  Eyes:     Conjunctiva/sclera: Conjunctivae normal.  Cardiovascular:     Rate and Rhythm: Normal rate.  Pulmonary:     Effort: Pulmonary effort is normal.  Abdominal:     Palpations: Abdomen is soft.  Musculoskeletal:        General: Tenderness present. No signs of injury.     Left foot: Normal capillary refill. Tenderness present. No deformity or bony tenderness. Normal pulse.       Feet:  Skin:    General: Skin is warm and dry.  Neurological:     Mental Status: He is alert and oriented to person, place, and time.  Psychiatric:        Mood and Affect: Mood normal.     ED Results / Procedures / Treatments   Labs (all labs ordered are listed, but only abnormal results are displayed) Labs Reviewed - No data to display  EKG None  Radiology DG Foot Complete Left  Result Date: 10/05/2019 CLINICAL DATA:  Swelling and pain. EXAM: LEFT FOOT - COMPLETE 3+ VIEW COMPARISON:  04/02/2016. FINDINGS: No acute bony or joint abnormality identified. No evidence of fracture dislocation. No radiopaque foreign  body. IMPRESSION: No acute abnormality Electronically Signed   By: Marcello Moores  Register   On: 10/05/2019 16:48    Procedures Procedures (including critical care time)  Medications Ordered in ED Medications - No data to display  ED Course  I have reviewed the triage vital signs and the nursing notes.  Pertinent labs & imaging results that were available during my care of the patient were reviewed by me and considered in my medical decision making (see chart for details).    MDM Rules/Calculators/A&P                      Patient X-Ray negative for obvious fracture or dislocation.  Pt advised to follow up with PCP and/or orthopedics. Patient given ace wrap, crutches while in ED, conservative therapy recommended and discussed. Patient will be discharged home & is agreeable with above plan. Returns precautions discussed. Pt appears safe for discharge. Final Clinical Impression(s) / ED Diagnoses Final diagnoses:  Foot pain, left    Rx / DC Orders ED Discharge Orders         Ordered    naproxen (NAPROSYN) 375 MG tablet  2 times daily     10/05/19 1726           Etta Quill, NP 10/05/19 1729    Davonna Belling, MD 10/05/19 [phone]

## 2019-10-10 DIAGNOSIS — Z1389 Encounter for screening for other disorder: Secondary | ICD-10-CM | POA: Diagnosis not present

## 2019-10-10 DIAGNOSIS — M722 Plantar fascial fibromatosis: Secondary | ICD-10-CM | POA: Diagnosis not present

## 2019-10-10 DIAGNOSIS — E6609 Other obesity due to excess calories: Secondary | ICD-10-CM | POA: Diagnosis not present

## 2019-10-10 DIAGNOSIS — M7989 Other specified soft tissue disorders: Secondary | ICD-10-CM | POA: Diagnosis not present

## 2019-10-10 DIAGNOSIS — Z6836 Body mass index (BMI) 36.0-36.9, adult: Secondary | ICD-10-CM | POA: Diagnosis not present

## 2019-10-12 ENCOUNTER — Encounter: Payer: Self-pay | Admitting: Orthopaedic Surgery

## 2019-10-12 ENCOUNTER — Ambulatory Visit (INDEPENDENT_AMBULATORY_CARE_PROVIDER_SITE_OTHER): Payer: BC Managed Care – PPO | Admitting: Orthopaedic Surgery

## 2019-10-12 ENCOUNTER — Other Ambulatory Visit: Payer: Self-pay

## 2019-10-12 ENCOUNTER — Ambulatory Visit: Payer: BC Managed Care – PPO

## 2019-10-12 VITALS — BP 180/84 | HR 95 | Ht 70.0 in | Wt 236.0 lb

## 2019-10-12 DIAGNOSIS — M25572 Pain in left ankle and joints of left foot: Secondary | ICD-10-CM

## 2019-10-12 DIAGNOSIS — S8255XA Nondisplaced fracture of medial malleolus of left tibia, initial encounter for closed fracture: Secondary | ICD-10-CM

## 2019-10-12 NOTE — Patient Instructions (Signed)
OUT OF WORK ?

## 2019-10-12 NOTE — Progress Notes (Signed)
Subjective:    Patient ID: [name] [name], male    DOB: [birth year], 39 y.o.   MRN: [id number]  HPI He hurt his left ankle at home on 10-04-2019.  He went to the ER the next day.  X-rays of the foot were negative.  He had more foot pain that day.  He has seen his family doctor and had injection of the plantar fascia.  That is better but his medial ankle on the left is swelling and tender.  I have reviewed the ER notes.  I have independently reviewed and interpreted x-rays of this patient done at another site by another physician or qualified health professional.  He has crutches, post op shoe.   Review of Systems  Constitutional: Positive for activity change.  Musculoskeletal: Positive for arthralgias, gait problem and joint swelling.  All other systems reviewed and are negative.  For Review of Systems, all other systems reviewed and are negative.  The following is a summary of the past history medically, past history surgically, known current medicines, social history and family history.  This information is gathered electronically by the computer from prior information and documentation.  I review this each visit and have found including this information at this point in the chart is beneficial and informative.   Past Medical History:  Diagnosis Date  . Anxiety   . CHF (congestive heart failure) ([place])   . Chronic systolic heart failure ([place])   . CKD (chronic kidney disease) stage 2, GFR 60-89 ml/min   . Dyspnea    with value issues- "if I dont take my medication"  . Essential hypertension, benign   . Headache   . History of pneumonia   . Mitral regurgitation    Moderate  . Noncompliance   . Nonischemic cardiomyopathy ([place])    Normal coronaries May 2012, LVEF < 20%  . Pneumonia 2011  . S/P aortic valve repair 05/11/2017   Complex valvuloplasty including plication of left coronary leaflet and 40mm Biostable HAART ring annuloplasty    Past Surgical History:  Procedure  Laterality Date  . AORTIC VALVE REPAIR N/A 05/11/2017   Procedure: AORTIC VALVE REPAIR;  Surgeon: Rexene Alberts, MD;  Location: [place];  Service: Open Heart Surgery;  Laterality: N/A;  . CARDIAC SURGERY    . RIGHT/LEFT HEART CATH AND CORONARY ANGIOGRAPHY N/A 04/20/2017   Procedure: RIGHT/LEFT HEART CATH AND CORONARY ANGIOGRAPHY;  Surgeon: Jolaine Artist, MD;  Location: [place] CV LAB;  Service: Cardiovascular;  Laterality: N/A;  . SURGERY SCROTAL / TESTICULAR     Testicular torsion  . TEE WITHOUT CARDIOVERSION N/A 04/09/2017   Procedure: TRANSESOPHAGEAL ECHOCARDIOGRAM (TEE);  Surgeon: Jolaine Artist, MD;  Location[facility] ENDOSCOPY;  Service: Cardiovascular;  Laterality: N/A;  . TEE WITHOUT CARDIOVERSION N/A 05/11/2017   Procedure: TRANSESOPHAGEAL ECHOCARDIOGRAM (TEE);  Surgeon: Rexene Alberts, MD;  Location: [place];  Service: Open Heart Surgery;  Laterality: N/A;    Current Outpatient Medications on File Prior to Visit  Medication Sig Dispense Refill  . acetaminophen (TYLENOL) 500 MG tablet Take 1,000 mg by mouth every 6 (six) hours as needed for moderate pain or headache.     . carvedilol (COREG) 25 MG tablet TAKE 1 TABLET BY MOUTH TWICE DAILY WITH MEALS 180 tablet 3  . colchicine 0.6 MG tablet     . diphenhydramine-acetaminophen (TYLENOL PM) 25-500 MG TABS tablet Take 1 tablet by mouth at bedtime as needed.    Marland Kitchen ENTRESTO 97-103 MG Take 1  tablet by mouth twice daily 180 tablet 3  . furosemide (LASIX) 40 MG tablet Take 2 tablets by mouth once daily 180 tablet 3  . hydrALAZINE (APRESOLINE) 25 MG tablet TAKE 1 TABLET BY MOUTH THREE TIMES DAILY 270 tablet 3  . naproxen (NAPROSYN) 375 MG tablet Take 1 tablet (375 mg total) by mouth 2 (two) times daily. 20 tablet 0   No current facility-administered medications on file prior to visit.    Social History   Socioeconomic History  . Marital status: Married    Spouse name: RaShannon  . Number of children: 0  . Years of education:  Not on file  . Highest education level: Some college, no degree  Occupational History    Employer: [employer]  Tobacco Use  . Smoking status: Never Smoker  . Smokeless tobacco: Never Used  Substance and Sexual Activity  . Alcohol use: Yes    Alcohol/week: 0.0 standard drinks    Comment: occasional  . Drug use: No  . Sexual activity: Yes    Birth control/protection: Condom  Other Topics Concern  . Not on file  Social History Narrative   Occupation: just started sanitation job cleaning      Patient is right-handed. He lives with his wife in a 1 story house. He drinks 1-2 sodas a day.    Social Determinants of Health   Financial Resource Strain:   . Difficulty of Paying Living Expenses:   Food Insecurity:   . Worried About Charity fundraiser in the Last Year:   . Arboriculturist in the Last Year:   Transportation Needs: No Transportation Needs  . Lack of Transportation (Medical): No  . Lack of Transportation (Non-Medical): No  Physical Activity: Insufficiently Active  . Days of Exercise per Week: 4 days  . Minutes of Exercise per Session: 30 min  Stress:   . Feeling of Stress :   Social Connections:   . Frequency of Communication with Friends and Family:   . Frequency of Social Gatherings with Friends and Family:   . Attends Religious Services:   . Active Member of Clubs or Organizations:   . Attends Archivist Meetings:   Marland Kitchen Marital Status:   Intimate Partner Violence:   . Fear of Current or Ex-Partner:   . Emotionally Abused:   Marland Kitchen Physically Abused:   . Sexually Abused:     Family History  Problem Relation Age of Onset  . Breast cancer Mother   . Stroke Father   . Hypertension Father     BP (!) 180/84   Pulse 95   Ht 5\' 10"  (1.778 m)   Wt 236 lb (107 kg)   BMI 33.86 kg/m   Body mass index is 33.86 kg/m.      Objective:   Physical Exam Vitals and nursing note reviewed.  Constitutional:      Appearance: He is well-developed.   HENT:     Head: Normocephalic and atraumatic.  Eyes:     Conjunctiva/sclera: Conjunctivae normal.     Pupils: Pupils are equal, round, and reactive to light.  Cardiovascular:     Rate and Rhythm: Normal rate and regular rhythm.  Pulmonary:     Effort: Pulmonary effort is normal.  Abdominal:     Palpations: Abdomen is soft.  Musculoskeletal:     Cervical back: Normal range of motion and neck supple.       Feet:  Skin:    General: Skin is warm and dry.  Neurological:     Mental Status: He is alert and oriented to person, place, and time.     Cranial Nerves: No cranial nerve deficit.     Motor: No abnormal muscle tone.     Coordination: Coordination normal.     Deep Tendon Reflexes: Reflexes are normal and symmetric. Reflexes normal.  Psychiatric:        Behavior: Behavior normal.        Thought Content: Thought content normal.        Judgment: Judgment normal.   x-rays were done of the left ankle, reported separately.  He has fracture medial malleolus.        Assessment & Plan:   Encounter Diagnoses  Name Primary?  . Acute left ankle pain Yes  . Closed nondisplaced fracture of medial malleolus of left tibia, initial encounter    He was shown his x-rays.  I will give him CAM walker.  Stay out of work.  Return in two weeks.  X-rays of the left ankle on return.  Contrast Bath instructions given.  Call if any problem.  Precautions discussed.   Electronically Signed Sanjuana Kava, MD 4/1/20219:14 AM

## 2019-10-23 ENCOUNTER — Telehealth: Payer: Self-pay | Admitting: Orthopaedic Surgery

## 2019-10-23 NOTE — Telephone Encounter (Signed)
Called patient to notify of [employer] short term disability forms received in fax; therefore, another [employer] fee and authorization form to be completed and signed for [employer] forms to be processed. Voiced understanding and will be in tomorrow to take care of.

## 2019-10-24 DIAGNOSIS — Z6836 Body mass index (BMI) 36.0-36.9, adult: Secondary | ICD-10-CM | POA: Diagnosis not present

## 2019-10-24 DIAGNOSIS — E6609 Other obesity due to excess calories: Secondary | ICD-10-CM | POA: Diagnosis not present

## 2019-10-24 DIAGNOSIS — M722 Plantar fascial fibromatosis: Secondary | ICD-10-CM | POA: Diagnosis not present

## 2019-10-26 ENCOUNTER — Ambulatory Visit: Payer: BC Managed Care – PPO

## 2019-10-26 ENCOUNTER — Encounter: Payer: Self-pay | Admitting: Orthopaedic Surgery

## 2019-10-26 ENCOUNTER — Ambulatory Visit (INDEPENDENT_AMBULATORY_CARE_PROVIDER_SITE_OTHER): Payer: BC Managed Care – PPO | Admitting: Orthopaedic Surgery

## 2019-10-26 ENCOUNTER — Other Ambulatory Visit: Payer: Self-pay

## 2019-10-26 DIAGNOSIS — S8255XD Nondisplaced fracture of medial malleolus of left tibia, subsequent encounter for closed fracture with routine healing: Secondary | ICD-10-CM

## 2019-10-26 NOTE — Patient Instructions (Signed)
Out of work until next visit

## 2019-10-26 NOTE — Progress Notes (Signed)
My ankle is better  He has been using the CAM walker.  He has less pain of the right medial ankle.  He has had problems with plantar fascitis and has seen [facility] for that.  X-rays were done of the right ankle, reported separately.  NV Intact.  ROM is good.  Encounter Diagnosis  Name Primary?  . Closed nondisplaced fracture of medial malleolus of left tibia with routine healing, subsequent encounter Yes   Continue the CAM walker, he can come out of it in the house.  Return in three weeks.  X-rays then.  Stay out of work.  Call if any problem.  Precautions discussed.   Electronically Signed Sanjuana Kava, MD 4/15/20218:42 AM

## 2019-11-16 ENCOUNTER — Other Ambulatory Visit: Payer: Self-pay

## 2019-11-16 ENCOUNTER — Encounter: Payer: Self-pay | Admitting: Orthopaedic Surgery

## 2019-11-16 ENCOUNTER — Ambulatory Visit: Payer: BC Managed Care – PPO

## 2019-11-16 ENCOUNTER — Ambulatory Visit (INDEPENDENT_AMBULATORY_CARE_PROVIDER_SITE_OTHER): Payer: BC Managed Care – PPO | Admitting: Orthopaedic Surgery

## 2019-11-16 DIAGNOSIS — S8255XD Nondisplaced fracture of medial malleolus of left tibia, subsequent encounter for closed fracture with routine healing: Secondary | ICD-10-CM

## 2019-11-16 NOTE — Progress Notes (Signed)
My ankle is better but my heel hurts.  He has no ankle pain.  He has some pain over the Achilles insertion on the left.  X-rays were done of the left ankle, reported separately.  Encounter Diagnosis  Name Primary?  . Closed nondisplaced fracture of medial malleolus of left tibia with routine healing, subsequent encounter Yes   Come out of CAM walker.  Use ice, Aspercreme, BioFreeze or Voltaren Gel to heel.  Return in two weeks.  X-rays then.  Call if any problem.  Precautions discussed.   Electronically Signed Sanjuana Kava, MD 5/6/20219:27 AM

## 2019-11-16 NOTE — Patient Instructions (Signed)
Ok to stop boot you can continue to use the crutch for a bit longer if you need it. Use ice and Use Aspercreme, Biofreeze or Voltaren gel over the counter 2-3 times daily make sure you rub it in well each time you use it.   Ok to wear boot also if the tendon continues to be inflammed

## 2019-11-30 ENCOUNTER — Encounter: Payer: Self-pay | Admitting: Orthopaedic Surgery

## 2019-11-30 ENCOUNTER — Ambulatory Visit (INDEPENDENT_AMBULATORY_CARE_PROVIDER_SITE_OTHER): Payer: BC Managed Care – PPO | Admitting: Orthopaedic Surgery

## 2019-11-30 ENCOUNTER — Ambulatory Visit: Payer: BC Managed Care – PPO

## 2019-11-30 ENCOUNTER — Other Ambulatory Visit: Payer: Self-pay

## 2019-11-30 DIAGNOSIS — S8255XD Nondisplaced fracture of medial malleolus of left tibia, subsequent encounter for closed fracture with routine healing: Secondary | ICD-10-CM

## 2019-11-30 NOTE — Progress Notes (Signed)
I am OK  He has no pain of the left ankle.  He has full ROM.    X-rays were done of the left ankle, reported separately.  Encounter Diagnosis  Name Primary?  . Closed nondisplaced fracture of medial malleolus of left tibia with routine healing, subsequent encounter Yes   Discharge.  Call if any problem.  Precautions discussed.   Electronically Signed Sanjuana Kava, MD 5/20/20218:47 AM

## 2019-12-19 ENCOUNTER — Ambulatory Visit: Payer: BC Managed Care – PPO | Admitting: Orthopaedic Surgery

## 2019-12-31 NOTE — Progress Notes (Signed)
Patient no show. Error

## 2020-01-01 ENCOUNTER — Inpatient Hospital Stay (HOSPITAL_COMMUNITY)
Admission: RE | Admit: 2020-01-01 | Discharge: 2020-01-01 | Disposition: A | Discharge status: Home / Self Care | Payer: BC Managed Care – PPO | Source: Ambulatory Visit | Attending: Internal Medicine | Admitting: Internal Medicine

## 2020-03-18 ENCOUNTER — Other Ambulatory Visit (HOSPITAL_COMMUNITY): Payer: Self-pay | Admitting: Internal Medicine

## 2020-05-01 ENCOUNTER — Other Ambulatory Visit (HOSPITAL_COMMUNITY): Payer: Self-pay | Admitting: Internal Medicine

## 2020-05-06 ENCOUNTER — Encounter (HOSPITAL_COMMUNITY): Payer: Self-pay | Admitting: Internal Medicine

## 2020-05-06 ENCOUNTER — Other Ambulatory Visit (HOSPITAL_COMMUNITY): Payer: Self-pay

## 2020-05-06 ENCOUNTER — Other Ambulatory Visit: Payer: Self-pay

## 2020-05-06 ENCOUNTER — Ambulatory Visit (HOSPITAL_COMMUNITY)
Admission: RE | Admit: 2020-05-06 | Discharge: 2020-05-06 | Disposition: A | Discharge status: Home / Self Care | Payer: BC Managed Care – PPO | Source: Ambulatory Visit | Attending: Internal Medicine | Admitting: Internal Medicine

## 2020-05-06 VITALS — BP 144/76 | HR 71 | Wt 233.8 lb

## 2020-05-06 DIAGNOSIS — I428 Other cardiomyopathies: Secondary | ICD-10-CM | POA: Insufficient documentation

## 2020-05-06 DIAGNOSIS — I351 Nonrheumatic aortic (valve) insufficiency: Secondary | ICD-10-CM | POA: Diagnosis not present

## 2020-05-06 DIAGNOSIS — Z7901 Long term (current) use of anticoagulants: Secondary | ICD-10-CM | POA: Insufficient documentation

## 2020-05-06 DIAGNOSIS — I5022 Chronic systolic (congestive) heart failure: Secondary | ICD-10-CM

## 2020-05-06 DIAGNOSIS — I10 Essential (primary) hypertension: Secondary | ICD-10-CM

## 2020-05-06 DIAGNOSIS — I130 Hypertensive heart and chronic kidney disease with heart failure and stage 1 through stage 4 chronic kidney disease, or unspecified chronic kidney disease: Secondary | ICD-10-CM | POA: Insufficient documentation

## 2020-05-06 DIAGNOSIS — Z79899 Other long term (current) drug therapy: Secondary | ICD-10-CM | POA: Diagnosis not present

## 2020-05-06 DIAGNOSIS — N183 Chronic kidney disease, stage 3 unspecified: Secondary | ICD-10-CM | POA: Insufficient documentation

## 2020-05-06 DIAGNOSIS — R002 Palpitations: Secondary | ICD-10-CM | POA: Diagnosis not present

## 2020-05-06 DIAGNOSIS — Z9889 Other specified postprocedural states: Secondary | ICD-10-CM

## 2020-05-06 DIAGNOSIS — K4090 Unilateral inguinal hernia, without obstruction or gangrene, not specified as recurrent: Secondary | ICD-10-CM | POA: Insufficient documentation

## 2020-05-06 DIAGNOSIS — E6609 Other obesity due to excess calories: Secondary | ICD-10-CM | POA: Diagnosis not present

## 2020-05-06 DIAGNOSIS — Z6835 Body mass index (BMI) 35.0-35.9, adult: Secondary | ICD-10-CM | POA: Diagnosis not present

## 2020-05-06 MED ORDER — DAPAGLIFLOZIN PROPANEDIOL 10 MG PO TABS: 10.0000 mg | ORAL_TABLET | Freq: Every day | ORAL | 5 refills | Status: DC

## 2020-05-06 MED ORDER — FUROSEMIDE 40 MG PO TABS: 40.0000 mg | ORAL_TABLET | Freq: Every day | ORAL | 5 refills | Status: DC

## 2020-05-06 MED ORDER — FUROSEMIDE 40 MG PO TABS: 80.0000 mg | ORAL_TABLET | Freq: Every day | ORAL | 3 refills | Status: DC

## 2020-05-06 NOTE — Patient Instructions (Signed)
No labs done today.   DECREASE Lasix(1 tablet) by mouth daily.  START Farxiga 10mg ( 1 tablet) by mouth daily.  No other medication changes were made. Please continue all other medications as prescribed.  Please remember to check your home blood pressures readings daily. ( blood pressure logs were provided to you today). The goal for your Systolic blood pressure readings(top number) should range between 120-140.   Your physician recommends that you schedule a follow-up appointment in: 6 months with an echo prior to your appointment. Please contact our office in March 2022 to schedule an April appointment.   If you have any questions or concerns before your next appointment please send [place] a message through [place] or call our office at [phone].    TO LEAVE A MESSAGE FOR THE NURSE SELECT OPTION 2, PLEASE LEAVE A MESSAGE INCLUDING: . YOUR NAME . DATE OF BIRTH . CALL BACK NUMBER . REASON FOR CALL**this is important as we prioritize the call backs  [place] AS LONG AS YOU CALL BEFORE 4:00 PM   Your physician has requested that you have an echocardiogram. Echocardiography is a painless test that uses sound waves to create images of your heart. It provides your doctor with information about the size and shape of your heart and how well your heart's chambers and valves are working. This procedure takes approximately one hour. There are no restrictions for this procedure.    At the [facility], you and your health needs are our priority. As part of our continuing mission to provide you with exceptional heart care, we have created designated Provider Care Teams. These Care Teams include your primary Cardiologist (physician) and Advanced Practice Providers (APPs- Physician Assistants and Nurse Practitioners) who all work together to provide you with the care you need, when you need it.   You may see any of the following providers on your  designated Care Team at your next follow up: Marland Kitchen Dr Glori Bickers . Dr Loralie Champagne . Darrick Grinder, NP . Lyda Jester, PA . Audry Riles, PharmD   Please be sure to bring in all your medications bottles to every appointment.

## 2020-05-06 NOTE — Progress Notes (Signed)
Advanced Heart Failure Clinic Note   Patient ID: ZAYDIN BILLEY, male   DOB: [birth year], 39 y.o.   MRN: [id number]  Primary PCP: [facility]  Primary Cardiologist: Dr. Haroldine Laws  Reason for Visit: F/u for Palpitations  HPI: Mr. Murrillo is a 39 y.o.male with systolic HF due to  NICM, hypertension and CKD stage II-III (baseline Cr 1.5-1.7).   He was first diagnosed with HF in 2012 and his EF recovered and was instructed to stop taking carvedilol, spironolactone, and lasix by previous cardiologist. Admitted to [facility] 0/99/83 for acute systolic heart failure. 12/31/11 ProBNP 3777. HIV nonreactive. Thyroid panel normal. Renal ultrasound was negative for obstruction. EF 20%. Discharge weight 193 lbs. Cath with normal coronaries.  Admitted 05/30/2015 with HTN urgency with troponin elevation likely consistent with myocardial strain/demand ischemia in the setting of medication non compliance, having run out 2-3 weeks ago and not refilling them. Placed back on his coreg, hydralazine, and spiro and was symptomatically stable on discharge. No ACE/ARB with CKD and does not tolerate nitrates with headaches. Discharge weight 193 lbs.  Underwent TEE in 9/18 with severe AI. EF 40-45%. Cath with normal coronaries. S/P AV repair 05/11/2017 with Dr. Roxy Manns.  PYP scan negative 10/18  Echo 02/13/19. EF 45% w/ moderate AI. EF out of range for ICD. Echo 3/21 EF 45% moderate AI. LV dimensions stable.   Seen in clinic 09/14/19 and complained of intermittent palpitations. Echo stable  Zio patchNSR w/ 1 brief run of NSVT (4 beats), 4 brief runs of SVT and rare PVCs < 1% burden.   Here for f/u. Still working at [employer]. Denies CP or SOB.  No edema, orthopnea or PND. Palpitations better. Developed R inguinal hernia. Going to school for Sports Management. ([employer])  ECHO 06/13/13: EF 20-25% ECHO 09/26/2014: EF 40%  ECHO 05/31/15 EF 20-25% Echo 11/17 EF 45-50% ECHO 03/2017 EF ~45% Severe AI  ECHO 06/23/2017:  EF 45-50% . Grade II DD ECHO 2019 EF 30-35%  Moderate aortic regurgitation. RV moderately reduced.  Echo 02/2019, EF 45%, moderate AI  Review of systems complete and found to be negative unless listed in HPI.   Past Medical History:  Diagnosis Date  . Anxiety   . CHF (congestive heart failure) ([place])   . Chronic systolic heart failure ([place])   . CKD (chronic kidney disease) stage 2, GFR 60-89 ml/min   . Dyspnea    with value issues- "if I dont take my medication"  . Essential hypertension, benign   . Headache   . History of pneumonia   . Mitral regurgitation    Moderate  . Noncompliance   . Nonischemic cardiomyopathy ([place])    Normal coronaries May 2012, LVEF < 20%  . Pneumonia 2011  . S/P aortic valve repair 05/11/2017   Complex valvuloplasty including plication of left coronary leaflet and 24mm Biostable HAART ring annuloplasty    Current Outpatient Medications  Medication Sig Dispense Refill  . acetaminophen (TYLENOL) 500 MG tablet Take 1,000 mg by mouth every 6 (six) hours as needed for moderate pain or headache.     . carvedilol (COREG) 25 MG tablet TAKE 1 TABLET BY MOUTH TWICE DAILY WITH MEALS 60 tablet 0  . diphenhydramine-acetaminophen (TYLENOL PM) 25-500 MG TABS tablet Take 1 tablet by mouth at bedtime as needed.    Marland Kitchen ENTRESTO 97-103 MG Take 1 tablet by mouth twice daily 60 tablet 0  . furosemide (LASIX) 40 MG tablet Take 2 tablets (80 mg total) by mouth daily.  60 tablet 3  . hydrALAZINE (APRESOLINE) 25 MG tablet TAKE 1 TABLET BY MOUTH THREE TIMES DAILY 90 tablet 0   No current facility-administered medications for this encounter.    Vitals:   05/06/20 1457  BP: (!) 144/76  Pulse: 71  SpO2: 98%  Weight: 106.1 kg (233 lb 12.8 oz)   Wt Readings from Last 3 Encounters:  05/06/20 106.1 kg (233 lb 12.8 oz)  10/12/19 107 kg (236 lb)  10/05/19 106.6 kg (235 lb)    PHYSICAL EXAM: General:  Well appearing. No resp difficulty HEENT: normal Neck: supple. no JVD.  Carotids 2+ bilat; no bruits. No lymphadenopathy or thryomegaly appreciated. Cor: PMI nondisplaced. Regular rate & rhythm. 3/6 SEM RUSB  2/6 AI Lungs: clear Abdomen: soft, nontender, nondistended. No hepatosplenomegaly. No bruits or masses. Good bowel sounds. Extremities: no cyanosis, clubbing, rash, edema Neuro: alert & orientedx3, cranial nerves grossly intact. moves all 4 extremities w/o difficulty. Affect pleasant   ASSESSMENT & PLAN: 1) Chronic systolic HF: NICM,  ECHO ~48% in 2013. EF 45-50% in 2016. Suspect due to ongoing AI +/- HTN - s/p AoV repair 10/18. F/u echo 12/18 with EF 45-50% moderate AI - ECHO 2019 EF 30-35% Mod AI.  - ECHO 02/2019 EF 45% (out of range for ICD) - Echo 3/21 EF 45% moderate AI. LV dimensions stable   -PYP scan negative for amyloid.  - Stable NYHA I-II. Volume status ok.  - Add farxiga 10  - Decrease Lasix 40 mg daily - Continue Coreg 25 mg BID - Continue Entresto 97/103 mg BID - Continue 25 mg mg hydralazine tid (had HAs with Imdur in past). BP was low this am (108) but seems to run higher. I have asked him to follow BPs closely at home and report back to [place]. Increase hydral as tolerated - labs today - Watch AI and LV dimensions with yearly echos.   2) HTN :  - Management as above  3) CKD stage III:  (baseline Cr 1.5-1.7)  - Check BMP today  - Start Farxiga for nephro protection - Keep SBP < 140  4) S/P AV repair 05/01/2017 - continues with moderate AI on ECHO 02/2020. LV dimensions stable - reinforced need for SBE prophylaxis  - - Watch AI and LV dimensions with yearly echos.   5) Palpitations   - Zio Patch ok   Glori Bickers, MD  4:33 PM

## 2020-05-07 ENCOUNTER — Telehealth (HOSPITAL_COMMUNITY): Payer: Self-pay

## 2020-05-07 NOTE — Telephone Encounter (Signed)
Called pt to determine if medical leave paper work is for ongoing absence or intermittent leave. lmom.

## 2020-05-08 NOTE — Addendum Note (Signed)
Encounter addended by: Scarlette Calico, RN on: 05/08/2020 4:02 PM  Actions taken: Order list changed, Diagnosis association updated

## 2020-05-14 ENCOUNTER — Encounter (HOSPITAL_COMMUNITY): Payer: Self-pay | Admitting: *Deleted

## 2020-05-14 NOTE — Progress Notes (Signed)
Pt's FMLA forms for intermittent FMLA completed and signed by Dr Haroldine Laws and faxed into [place] at [phone]

## 2020-05-28 ENCOUNTER — Other Ambulatory Visit (HOSPITAL_COMMUNITY): Payer: Self-pay | Admitting: Physician Assistant

## 2020-05-28 ENCOUNTER — Other Ambulatory Visit: Payer: Self-pay | Admitting: Physician Assistant

## 2020-05-28 DIAGNOSIS — K4090 Unilateral inguinal hernia, without obstruction or gangrene, not specified as recurrent: Secondary | ICD-10-CM

## 2020-06-05 ENCOUNTER — Ambulatory Visit (HOSPITAL_COMMUNITY): Admission: RE | Admit: 2020-06-05 | Payer: BC Managed Care – PPO | Source: Ambulatory Visit

## 2020-06-05 ENCOUNTER — Encounter (HOSPITAL_COMMUNITY): Payer: Self-pay

## 2020-06-07 ENCOUNTER — Encounter (HOSPITAL_COMMUNITY): Payer: Self-pay

## 2020-06-07 ENCOUNTER — Emergency Department (HOSPITAL_COMMUNITY)
Admission: EM | Admit: 2020-06-07 | Discharge: 2020-06-07 | Disposition: A | Discharge status: Home / Self Care | Payer: BC Managed Care – PPO | Attending: Emergency Medicine | Admitting: Emergency Medicine

## 2020-06-07 ENCOUNTER — Other Ambulatory Visit: Payer: Self-pay

## 2020-06-07 ENCOUNTER — Emergency Department (HOSPITAL_COMMUNITY): Payer: BC Managed Care – PPO

## 2020-06-07 DIAGNOSIS — Z79899 Other long term (current) drug therapy: Secondary | ICD-10-CM | POA: Insufficient documentation

## 2020-06-07 DIAGNOSIS — I517 Cardiomegaly: Secondary | ICD-10-CM | POA: Diagnosis not present

## 2020-06-07 DIAGNOSIS — R059 Cough, unspecified: Secondary | ICD-10-CM | POA: Insufficient documentation

## 2020-06-07 DIAGNOSIS — Z20822 Contact with and (suspected) exposure to covid-19: Secondary | ICD-10-CM | POA: Insufficient documentation

## 2020-06-07 DIAGNOSIS — I5022 Chronic systolic (congestive) heart failure: Secondary | ICD-10-CM | POA: Diagnosis not present

## 2020-06-07 DIAGNOSIS — N182 Chronic kidney disease, stage 2 (mild): Secondary | ICD-10-CM | POA: Insufficient documentation

## 2020-06-07 DIAGNOSIS — I130 Hypertensive heart and chronic kidney disease with heart failure and stage 1 through stage 4 chronic kidney disease, or unspecified chronic kidney disease: Secondary | ICD-10-CM | POA: Diagnosis not present

## 2020-06-07 LAB — RESP PANEL BY RT-PCR (FLU A&B, COVID) ARPGX2
Influenza A by PCR: NEGATIVE
Influenza B by PCR: NEGATIVE
SARS Coronavirus 2 by RT PCR: NEGATIVE

## 2020-06-07 MED ORDER — ALBUTEROL SULFATE HFA 108 (90 BASE) MCG/ACT IN AERS
2.0000 | INHALATION_SPRAY | Freq: Once | RESPIRATORY_TRACT | Status: AC
  Administered 2020-06-07: 2 via RESPIRATORY_TRACT
  Filled 2020-06-07: qty 6.7

## 2020-06-07 MED ORDER — DOXYCYCLINE HYCLATE 100 MG PO CAPS: 100.0000 mg | ORAL_CAPSULE | Freq: Two times a day (BID) | ORAL | 0 refills | Status: DC

## 2020-06-07 NOTE — ED Provider Notes (Signed)
[facility] EMERGENCY DEPARTMENT Provider Note   CSN: [id number] Arrival date & time: 06/07/20  9509     History Chief Complaint  Patient presents with  . Cough    [name] [name] is a 39 y.o. male.  HPI      [name] [name] is a 39 y.o. male with past medical history of CHF, CKD stage II, hypertension and aortic valve repair who presents to the Emergency Department complaining of nasal congestion, rhinorrhea and cough.  Symptoms have been present for 2 days.  Initially, he states his symptoms presented as mild sore throat with head congestion.  He has been taking over-the-counter Coricidin with minimal to no relief.  Last evening, he reports noticing wheezing and discomfort of his left lateral chest while lying supine.  He states his cough is mostly been nonproductive.  He denies any persistent chest pain or dyspnea.  States he is able to walk and exert himself without shortness of breath.  He denies fever or known Covid exposures.  He also denies abdominal pain, vomiting or diarrhea.  No loss of taste or smell.  No household members are currently ill.  He is not vaccinated for the COVID-19 virus.   Past Medical History:  Diagnosis Date  . Anxiety   . CHF (congestive heart failure) ([place])   . Chronic systolic heart failure ([place])   . CKD (chronic kidney disease) stage 2, GFR 60-89 ml/min   . Dyspnea    with value issues- "if I dont take my medication"  . Essential hypertension, benign   . Headache   . History of pneumonia   . Mitral regurgitation    Moderate  . Noncompliance   . Nonischemic cardiomyopathy ([place])    Normal coronaries May 2012, LVEF < 20%  . Pneumonia 2011  . S/P aortic valve repair 05/11/2017   Complex valvuloplasty including plication of left coronary leaflet and 50mm Biostable HAART ring annuloplasty    Patient Active Problem List   Diagnosis Date Noted  . S/P aortic valve repair 05/11/2017  . Aortic valve regurgitation   . Chronic systolic CHF  (congestive heart failure) ([place])   . Hypertensive emergency 05/30/2015  . Essential hypertension 11/22/2014  . Shift work sleep disorder 10/30/2014  . Obstructive sleep apnea 10/30/2014  . Hypertensive crisis 09/26/2014  . Hypertensive urgency 06/13/2013  . CHF (congestive heart failure) ([place]) 06/13/2013  . CKD (chronic kidney disease) stage 3, GFR 30-59 ml/min (HCC) 06/13/2013  . Nonischemic cardiomyopathy ([place]) 06/13/2013  . HTN (hypertension) 02/25/2012  . Chronic combined systolic and diastolic heart failure ([place]) 01/08/2012  . Elevated troponin 01/01/2012  . Hypokalemia 12/31/2011  . Chest pain 12/31/2011    Past Surgical History:  Procedure Laterality Date  . AORTIC VALVE REPAIR N/A 05/11/2017   Procedure: AORTIC VALVE REPAIR;  Surgeon: Rexene Alberts, MD;  Location: [facility];  Service: Open Heart Surgery;  Laterality: N/A;  . CARDIAC SURGERY    . RIGHT/LEFT HEART CATH AND CORONARY ANGIOGRAPHY N/A 04/20/2017   Procedure: RIGHT/LEFT HEART CATH AND CORONARY ANGIOGRAPHY;  Surgeon: Jolaine Artist, MD;  Location: [facility] CV [facility];  Service: Cardiovascular;  Laterality: N/A;  . SURGERY SCROTAL / TESTICULAR     Testicular torsion  . TEE WITHOUT CARDIOVERSION N/A 04/09/2017   Procedure: TRANSESOPHAGEAL ECHOCARDIOGRAM (TEE);  Surgeon: Jolaine Artist, MD;  [facility] ENDOSCOPY;  Service: Cardiovascular;  Laterality: N/A;  . TEE WITHOUT CARDIOVERSION N/A 05/11/2017   Procedure: TRANSESOPHAGEAL ECHOCARDIOGRAM (TEE);  Surgeon: Darylene Price  H, MD;  Location: [facility];  Service: Open Heart Surgery;  Laterality: N/A;       Family History  Problem Relation Age of Onset  . Breast cancer Mother   . Stroke Father   . Hypertension Father     Social History   Tobacco Use  . Smoking status: Never Smoker  . Smokeless tobacco: Never Used  Vaping Use  . Vaping Use: Never used  Substance Use Topics  . Alcohol use: Yes    Alcohol/week: 0.0 standard drinks    Comment: occasional   . Drug use: No    Home Medications Prior to Admission medications   Medication Sig Start Date End Date Taking? Authorizing Provider  acetaminophen (TYLENOL) 500 MG tablet Take 1,000 mg by mouth every 6 (six) hours as needed for moderate pain or headache.     [provider]  carvedilol (COREG) 25 MG tablet TAKE 1 TABLET BY MOUTH TWICE DAILY WITH MEALS 05/02/20   Bensimhon, Shaune Pascal, MD  dapagliflozin propanediol (FARXIGA) 10 MG TABS tablet Take 1 tablet (10 mg total) by mouth daily before breakfast. 05/06/20   Bensimhon, Shaune Pascal, MD  diphenhydramine-acetaminophen (TYLENOL PM) 25-500 MG TABS tablet Take 1 tablet by mouth at bedtime as needed.    [provider]  ENTRESTO 97-103 MG Take 1 tablet by mouth twice daily 05/02/20   Bensimhon, Shaune Pascal, MD  furosemide (LASIX) 40 MG tablet Take 1 tablet (40 mg total) by mouth daily. 05/06/20   Bensimhon, Shaune Pascal, MD  hydrALAZINE (APRESOLINE) 25 MG tablet TAKE 1 TABLET BY MOUTH THREE TIMES DAILY 05/02/20   Bensimhon, Shaune Pascal, MD    Allergies    Patient has no known allergies.  Review of Systems   Review of Systems  Constitutional: Negative for chills, fatigue and fever.  HENT: Positive for congestion, rhinorrhea and sore throat. Negative for trouble swallowing.   Respiratory: Positive for cough, chest tightness and wheezing. Negative for shortness of breath.   Cardiovascular: Negative for chest pain and palpitations.  Gastrointestinal: Negative for abdominal pain, blood in stool, nausea and vomiting.  Genitourinary: Negative for dysuria and flank pain.  Musculoskeletal: Negative for arthralgias, myalgias, neck pain and neck stiffness.  Skin: Negative for rash.  Neurological: Negative for dizziness, weakness, numbness and headaches.  Hematological: Does not bruise/bleed easily.    Physical Exam Updated Vital Signs BP 97/67 (BP Location: Right Arm)   Pulse 81   Temp 98.1 F (36.7 C) (Oral)   Resp 18   Ht 5\' 10"   (1.778 m)   Wt 104.3 kg   SpO2 96%   BMI 33.00 kg/m   Physical Exam Vitals and nursing note reviewed.  Constitutional:      General: He is not in acute distress.    Appearance: Normal appearance. He is not ill-appearing.  HENT:     Right Ear: Tympanic membrane and ear canal normal.     Left Ear: Tympanic membrane and ear canal normal.     Mouth/Throat:     Mouth: Mucous membranes are moist.     Pharynx: No oropharyngeal exudate or posterior oropharyngeal erythema.     Comments: Uvula midline and nonedematous.  No exudates Cardiovascular:     Rate and Rhythm: Normal rate and regular rhythm.     Pulses: Normal pulses.  Pulmonary:     Effort: Pulmonary effort is normal.     Breath sounds: Normal breath sounds.  Abdominal:     Palpations: Abdomen is soft.  Tenderness: There is no abdominal tenderness.  Musculoskeletal:        General: Normal range of motion.     Cervical back: Normal range of motion.     Right lower leg: No edema.     Left lower leg: No edema.  Skin:    General: Skin is warm.     Capillary Refill: Capillary refill takes less than 2 seconds.     Findings: No rash.  Neurological:     General: No focal deficit present.     Mental Status: He is alert.     Sensory: No sensory deficit.     Motor: No weakness.     ED Results / Procedures / Treatments   Labs (all labs ordered are listed, but only abnormal results are displayed) Labs Reviewed  RESP PANEL BY RT-PCR (FLU A&B, COVID) [id number]    EKG None  Radiology DG Chest Portable 1 View  Result Date: 06/07/2020 CLINICAL DATA:  Cough. EXAM: PORTABLE CHEST 1 VIEW COMPARISON:  January 31, 2019. FINDINGS: Enlarged cardiac silhouette with changes of median sternotomy. No consolidation. No visible pleural effusions or pneumothorax. No acute osseous abnormality. IMPRESSION: Cardiomegaly without evidence of acute cardiopulmonary disease. Electronically Signed   By: Margaretha Sheffield MD   On: 06/07/2020 10:24     Procedures Procedures (including critical care time)  Medications Ordered in ED Medications - No data to display  ED Course  I have reviewed the triage vital signs and the nursing notes.  Pertinent labs & imaging results that were available during my care of the patient were reviewed by me and considered in my medical decision making (see chart for details).    MDM Rules/Calculators/A&P                          Patient here with complaints of nasal congestion, rhinorrhea and cough x2 days.  No known Covid exposures.  No household members are currently ill.  He has not been vaccinated against COVID-19.    Chest x-ray shows cardiomegaly without evidence of acute disease, no pleural effusion.  Covid, influenza swab negative.  [name] [name] was evaluated in Emergency Department on 06/10/2020 for the symptoms described in the history of present illness. He was evaluated in the context of the global COVID-19 pandemic, which necessitated consideration that the patient might be at risk for infection with the SARS-CoV-2 virus that causes COVID-19. Institutional protocols and algorithms that pertain to the evaluation of patients at risk for COVID-19 are in a state of rapid change based on information released by regulatory bodies including the CDC and federal and state organizations. These policies and algorithms were followed during the patient's care in the ED.    Final Clinical Impression(s) / ED Diagnoses Final diagnoses:  Cough    Rx / DC Orders ED Discharge Orders    None       Kem Parkinson, PA-C 06/10/20 1315    Milton Ferguson, MD 06/11/20 [phone]

## 2020-06-07 NOTE — ED Triage Notes (Signed)
Pt reports has been coughing and wheezing for 2 days. Pt  Reports he also has nasal and head congestion

## 2020-06-07 NOTE — Discharge Instructions (Addendum)
Your chest x-ray today did not show evidence for pneumonia or fluid on your lungs.  Your flu and Covid test were negative.  Use the albuterol inhaler, 1 to 2 puffs every 4-6 hours as needed for wheezing.  Tylenol if needed for fever and/or body aches.  Take the antibiotic as directed until its finished.  Follow-up with your primary care provider for recheck or return to the emergency department if you develop any worsening symptoms.

## 2020-06-12 ENCOUNTER — Other Ambulatory Visit (HOSPITAL_COMMUNITY): Payer: Self-pay | Admitting: Internal Medicine

## 2020-06-19 ENCOUNTER — Emergency Department (HOSPITAL_COMMUNITY): Payer: BC Managed Care – PPO

## 2020-06-19 ENCOUNTER — Emergency Department (HOSPITAL_COMMUNITY)
Admission: EM | Admit: 2020-06-19 | Discharge: 2020-06-19 | Disposition: A | Discharge status: Home / Self Care | Payer: BC Managed Care – PPO | Attending: Emergency Medicine | Admitting: Emergency Medicine

## 2020-06-19 ENCOUNTER — Other Ambulatory Visit: Payer: Self-pay

## 2020-06-19 ENCOUNTER — Encounter (HOSPITAL_COMMUNITY): Payer: Self-pay | Admitting: Emergency Medicine

## 2020-06-19 DIAGNOSIS — I130 Hypertensive heart and chronic kidney disease with heart failure and stage 1 through stage 4 chronic kidney disease, or unspecified chronic kidney disease: Secondary | ICD-10-CM | POA: Insufficient documentation

## 2020-06-19 DIAGNOSIS — I5042 Chronic combined systolic (congestive) and diastolic (congestive) heart failure: Secondary | ICD-10-CM | POA: Insufficient documentation

## 2020-06-19 DIAGNOSIS — N183 Chronic kidney disease, stage 3 unspecified: Secondary | ICD-10-CM | POA: Insufficient documentation

## 2020-06-19 DIAGNOSIS — M25521 Pain in right elbow: Secondary | ICD-10-CM | POA: Insufficient documentation

## 2020-06-19 DIAGNOSIS — M19021 Primary osteoarthritis, right elbow: Secondary | ICD-10-CM | POA: Diagnosis not present

## 2020-06-19 DIAGNOSIS — Z79899 Other long term (current) drug therapy: Secondary | ICD-10-CM | POA: Insufficient documentation

## 2020-06-19 MED ORDER — KETOROLAC TROMETHAMINE 30 MG/ML IJ SOLN: 30.0000 mg | Freq: Once | INTRAMUSCULAR | Status: DC

## 2020-06-19 MED ORDER — IBUPROFEN 400 MG PO TABS
600.0000 mg | ORAL_TABLET | Freq: Once | ORAL | Status: AC
  Administered 2020-06-19: 600 mg via ORAL
  Filled 2020-06-19: qty 2

## 2020-06-19 NOTE — ED Triage Notes (Signed)
Pt c/o Rt arm pain at the elbow with movement. Denies injury.

## 2020-06-19 NOTE — ED Provider Notes (Signed)
[facility] EMERGENCY DEPARTMENT Provider Note   CSN: [id number] Arrival date & time: 06/19/20  1501     History Chief Complaint  Patient presents with  . Arm Pain    [name] [name] is a 39 y.o. male presenting to the emergency department with complaint of right elbow pain.  He noticed over the last couple of days pain worsening.  He has associated swelling to the lateral aspect of his elbow.  He is able to range his elbow though this causes pain.  No particular trauma though does state he works at [employer] and does repetitive pushing and pulling movements with his arms.  He has treated his symptoms with Goody's powder and ibuprofen with moderate relief.  Denies fevers or chills, history of gout or IV drug use, history of immunocompromise.  The history is provided by the patient.       Past Medical History:  Diagnosis Date  . Anxiety   . CHF (congestive heart failure) ([place])   . Chronic systolic heart failure ([place])   . CKD (chronic kidney disease) stage 2, GFR 60-89 ml/min   . Dyspnea    with value issues- "if I dont take my medication"  . Essential hypertension, benign   . Headache   . History of pneumonia   . Mitral regurgitation    Moderate  . Noncompliance   . Nonischemic cardiomyopathy ([place])    Normal coronaries May 2012, LVEF < 20%  . Pneumonia 2011  . S/P aortic valve repair 05/11/2017   Complex valvuloplasty including plication of left coronary leaflet and 74mm Biostable HAART ring annuloplasty    Patient Active Problem List   Diagnosis Date Noted  . S/P aortic valve repair 05/11/2017  . Aortic valve regurgitation   . Chronic systolic CHF (congestive heart failure) ([place])   . Hypertensive emergency 05/30/2015  . Essential hypertension 11/22/2014  . Shift work sleep disorder 10/30/2014  . Obstructive sleep apnea 10/30/2014  . Hypertensive crisis 09/26/2014  . Hypertensive urgency 06/13/2013  . CHF (congestive heart failure) ([place]) 06/13/2013  . CKD  (chronic kidney disease) stage 3, GFR 30-59 ml/min (H[facility]) 06/13/2013  . Nonischemic cardiomyopathy ([place]) 06/13/2013  . HTN (hypertension) 02/25/2012  . Chronic combined systolic and diastolic heart failure ([place]) 01/08/2012  . Elevated troponin 01/01/2012  . Hypokalemia 12/31/2011  . Chest pain 12/31/2011    Past Surgical History:  Procedure Laterality Date  . AORTIC VALVE REPAIR N/A 05/11/2017   Procedure: AORTIC VALVE REPAIR;  Surgeon: Rexene Alberts, MD;  Location: [facility];  Service: Open Heart Surgery;  Laterality: N/A;  . CARDIAC SURGERY    . RIGHT/LEFT HEART CATH AND CORONARY ANGIOGRAPHY N/A 04/20/2017   Procedure: RIGHT/LEFT HEART CATH AND CORONARY ANGIOGRAPHY;  Surgeon: Jolaine Artist, MD;  Location: [facility];  Service: Cardiovascular;  Laterality: N/A;  . SURGERY SCROTAL / TESTICULAR     Testicular torsion  . TEE WITHOUT CARDIOVERSION N/A 04/09/2017   Procedure: TRANSESOPHAGEAL ECHOCARDIOGRAM (TEE);  Surgeon: Jolaine Artist, MD;  Location: [facility] ENDOSCOPY;  Service: Cardiovascular;  Laterality: N/A;  . TEE WITHOUT CARDIOVERSION N/A 05/11/2017   Procedure: TRANSESOPHAGEAL ECHOCARDIOGRAM (TEE);  Surgeon: Rexene Alberts, MD;  Location: [facility];  Service: Open Heart Surgery;  Laterality: N/A;       Family History  Problem Relation Age of Onset  . Breast cancer Mother   . Stroke Father   . Hypertension Father     Social History   Tobacco Use  .  Smoking status: Never Smoker  . Smokeless tobacco: Never Used  Vaping Use  . Vaping Use: Never used  Substance Use Topics  . Alcohol use: Yes    Alcohol/week: 0.0 standard drinks    Comment: occasional  . Drug use: No    Home Medications Prior to Admission medications   Medication Sig Start Date End Date Taking? Authorizing Provider  acetaminophen (TYLENOL) 500 MG tablet Take 1,000 mg by mouth every 6 (six) hours as needed for moderate pain or headache.    Yes [provider]  carvedilol (COREG) 25  MG tablet TAKE 1 TABLET BY MOUTH TWICE DAILY WITH MEALS 06/12/20  Yes Bensimhon, Shaune Pascal, MD  diphenhydramine-acetaminophen (TYLENOL PM) 25-500 MG TABS tablet Take 1 tablet by mouth at bedtime as needed.   Yes [provider]  ENTRESTO 97-103 MG Take 1 tablet by mouth twice daily 06/12/20  Yes Bensimhon, Shaune Pascal, MD  furosemide (LASIX) 40 MG tablet Take 1 tablet (40 mg total) by mouth daily. 05/06/20  Yes Bensimhon, Shaune Pascal, MD  dapagliflozin propanediol (FARXIGA) 10 MG TABS tablet Take 1 tablet (10 mg total) by mouth daily before breakfast. Patient not taking: Reported on 06/19/2020 05/06/20   Bensimhon, Shaune Pascal, MD  doxycycline (VIBRAMYCIN) 100 MG capsule Take 1 capsule (100 mg total) by mouth 2 (two) times daily. Patient not taking: Reported on 06/19/2020 06/07/20   Kem Parkinson, PA-C  hydrALAZINE (APRESOLINE) 25 MG tablet TAKE 1 TABLET BY MOUTH THREE TIMES DAILY 06/12/20   Bensimhon, Shaune Pascal, MD    Allergies    Patient has no known allergies.  Review of Systems   Review of Systems  Constitutional: Negative for fever.  Musculoskeletal: Positive for arthralgias.  Skin: Negative for color change and wound.  Allergic/Immunologic: Negative for immunocompromised state.    Physical Exam Updated Vital Signs BP (!) 141/71 (BP Location: Left Arm)   Pulse 85   Temp 97.9 F (36.6 C) (Oral)   Resp 18   Ht 5\' 10"  (1.778 m)   Wt 104.3 kg   SpO2 94%   BMI 33.00 kg/m   Physical Exam Vitals and nursing note reviewed.  Constitutional:      General: He is not in acute distress.    Appearance: He is well-developed.  HENT:     Head: Normocephalic and atraumatic.  Eyes:     Conjunctiva/sclera: Conjunctivae normal.  Cardiovascular:     Rate and Rhythm: Normal rate.  Pulmonary:     Effort: Pulmonary effort is normal.  Musculoskeletal:     Comments: Right elbow with tenderness and localized swelling over the lateral aspect over the lateral epicondyle.  There is no redness or  warmth.  No TTP or swelling over the olecranon process.  Patient is able to range the elbow and wrist though this does cause pain.  Normal distal radial pulse distal sensation.  Neurological:     Mental Status: He is alert.  Psychiatric:        Mood and Affect: Mood normal.        Behavior: Behavior normal.     ED Results / Procedures / Treatments   Labs (all labs ordered are listed, but only abnormal results are displayed) Labs Reviewed - No data to display  EKG None  Radiology DG Elbow Complete Right  Result Date: 06/19/2020 CLINICAL DATA:  Right elbow pain with movement. EXAM: RIGHT ELBOW - COMPLETE 3+ VIEW COMPARISON:  None. FINDINGS: There is no evidence of an acute fracture, dislocation, or  joint effusion. A small bony spur is seen along the right olecranon process. A mild amount of adjacent dorsal soft tissue swelling is noted. IMPRESSION: Degenerative changes without an acute osseous abnormality. Electronically Signed   By: Virgina Norfolk M.D.   On: 06/19/2020 16:30    Procedures Procedures (including critical care time)  Medications Ordered in ED Medications  ibuprofen (ADVIL) tablet 600 mg (600 mg Oral Given 06/19/20 1617)    ED Course  I have reviewed the triage vital signs and the nursing notes.  Pertinent labs & imaging results that were available during my care of the patient were reviewed by me and considered in my medical decision making (see chart for details).  Clinical Course as of Jun 19 1649  Wed Jun 19, 2020  1625 X-ray independently reviewed and interpreted - Appears negative for acute fracture  DG Elbow Complete Right [JR]    Clinical Course User Index [JR] Lavanya Roa, [place] N, PA-C   MDM Rules/Calculators/A&P                           Patient with right elbow pain and swelling over the lateral aspect.  He works in a [employer] and does repetitive pulling and pushing movements.  Suspect symptoms are due to a tendinitis.  No history of  gout, no risk factors for septic joint, swelling is localized to the lateral aspect.  X-rays negative for acute fracture or evidence of joint effusion.  Recommend RICE therapy, Ace wrap applied.  Considering patient's history of CKD, recommend Tylenol as needed for pain.  Will provide orthopedic referral for follow-up if symptoms persist.  Work Note provided for few days rest.  Discussed results, findings, treatment and follow up. Patient advised of return precautions. Patient verbalized understanding and agreed with plan.  Final Clinical Impression(s) / ED Diagnoses Final diagnoses:  Right elbow pain    Rx / DC Orders ED Discharge Orders    None       Frances Joynt, [place] N, PA-C 71/24/58 [id number]    Delora Fuel, MD 33/82/50 0010

## 2020-06-19 NOTE — Discharge Instructions (Addendum)
Your x-ray shows no evidence of fracture or swelling in your joint.  As discussed her pain and swelling may be due to a tendinitis.  Please rest your elbow, apply ice for 20 minutes at a time to help with pain and swelling.  Wear the Ace wrap for compression and support.  You can take Tylenol every 4-6 hours as needed for pain.  Considering your kidney function, it is not recommended to take ibuprofen or Aleve (any NSAID) on a regular basis.  Schedule appointment with the orthopedic specialist if symptoms persist.

## 2020-06-25 ENCOUNTER — Ambulatory Visit (HOSPITAL_COMMUNITY): Admission: RE | Admit: 2020-06-25 | Payer: BC Managed Care – PPO | Source: Ambulatory Visit

## 2020-07-18 ENCOUNTER — Other Ambulatory Visit (HOSPITAL_COMMUNITY): Payer: Self-pay | Admitting: Internal Medicine

## 2020-08-01 DIAGNOSIS — M722 Plantar fascial fibromatosis: Secondary | ICD-10-CM | POA: Diagnosis not present

## 2020-08-01 DIAGNOSIS — G4709 Other insomnia: Secondary | ICD-10-CM | POA: Diagnosis not present

## 2020-08-01 DIAGNOSIS — Z6834 Body mass index (BMI) 34.0-34.9, adult: Secondary | ICD-10-CM | POA: Diagnosis not present

## 2020-08-01 DIAGNOSIS — R7309 Other abnormal glucose: Secondary | ICD-10-CM | POA: Diagnosis not present

## 2020-08-01 DIAGNOSIS — E6609 Other obesity due to excess calories: Secondary | ICD-10-CM | POA: Diagnosis not present

## 2020-09-21 ENCOUNTER — Other Ambulatory Visit (HOSPITAL_COMMUNITY): Payer: Self-pay | Admitting: Internal Medicine

## 2020-10-02 ENCOUNTER — Emergency Department (HOSPITAL_COMMUNITY): Payer: BC Managed Care – PPO

## 2020-10-02 ENCOUNTER — Encounter (HOSPITAL_COMMUNITY): Payer: Self-pay

## 2020-10-02 ENCOUNTER — Other Ambulatory Visit: Payer: Self-pay

## 2020-10-02 ENCOUNTER — Emergency Department (HOSPITAL_COMMUNITY)
Admission: EM | Admit: 2020-10-02 | Discharge: 2020-10-02 | Disposition: A | Discharge status: Home / Self Care | Payer: BC Managed Care – PPO | Attending: Emergency Medicine | Admitting: Emergency Medicine

## 2020-10-02 DIAGNOSIS — N182 Chronic kidney disease, stage 2 (mild): Secondary | ICD-10-CM | POA: Diagnosis not present

## 2020-10-02 DIAGNOSIS — I110 Hypertensive heart disease with heart failure: Secondary | ICD-10-CM | POA: Diagnosis not present

## 2020-10-02 DIAGNOSIS — Z79899 Other long term (current) drug therapy: Secondary | ICD-10-CM | POA: Diagnosis not present

## 2020-10-02 DIAGNOSIS — R002 Palpitations: Secondary | ICD-10-CM | POA: Diagnosis not present

## 2020-10-02 DIAGNOSIS — I712 Thoracic aortic aneurysm, without rupture, unspecified: Secondary | ICD-10-CM

## 2020-10-02 DIAGNOSIS — I509 Heart failure, unspecified: Secondary | ICD-10-CM

## 2020-10-02 DIAGNOSIS — I130 Hypertensive heart and chronic kidney disease with heart failure and stage 1 through stage 4 chronic kidney disease, or unspecified chronic kidney disease: Secondary | ICD-10-CM | POA: Insufficient documentation

## 2020-10-02 DIAGNOSIS — Z9861 Coronary angioplasty status: Secondary | ICD-10-CM | POA: Diagnosis not present

## 2020-10-02 DIAGNOSIS — I517 Cardiomegaly: Secondary | ICD-10-CM | POA: Diagnosis not present

## 2020-10-02 DIAGNOSIS — I5023 Acute on chronic systolic (congestive) heart failure: Secondary | ICD-10-CM

## 2020-10-02 DIAGNOSIS — R0602 Shortness of breath: Secondary | ICD-10-CM | POA: Diagnosis not present

## 2020-10-02 DIAGNOSIS — J90 Pleural effusion, not elsewhere classified: Secondary | ICD-10-CM | POA: Diagnosis not present

## 2020-10-02 DIAGNOSIS — R079 Chest pain, unspecified: Secondary | ICD-10-CM | POA: Diagnosis not present

## 2020-10-02 LAB — BASIC METABOLIC PANEL
Anion gap: 7 (ref 5–15)
BUN: 21 mg/dL — ABNORMAL HIGH (ref 6–20)
CO2: 20 mmol/L — ABNORMAL LOW (ref 22–32)
Calcium: 8.9 mg/dL (ref 8.9–10.3)
Chloride: 110 mmol/L (ref 98–111)
Creatinine, Ser: 1.56 mg/dL — ABNORMAL HIGH (ref 0.61–1.24)
GFR, Estimated: 58 mL/min — ABNORMAL LOW (ref >60)
Glucose, Bld: 112 mg/dL — ABNORMAL HIGH (ref 70–99)
Potassium: 4 mmol/L (ref 3.5–5.1)
Sodium: 137 mmol/L (ref 135–145)

## 2020-10-02 LAB — CBC
HCT: 43.5 % (ref 39.0–52.0)
Hemoglobin: 14 g/dL (ref 13.0–17.0)
MCH: 27.3 pg (ref 26.0–34.0)
MCHC: 32.2 g/dL (ref 30.0–36.0)
MCV: 84.8 fL (ref 80.0–100.0)
Platelets: 242 10*3/uL (ref 150–400)
RBC: 5.13 MIL/uL (ref 4.22–5.81)
RDW: 15.6 % — ABNORMAL HIGH (ref 11.5–15.5)
WBC: 4.6 10*3/uL (ref 4.0–10.5)
nRBC: 0 % (ref 0.0–0.2)

## 2020-10-02 LAB — TROPONIN I (HIGH SENSITIVITY)
Troponin I (High Sensitivity): 25 ng/L — ABNORMAL HIGH (ref <18)
Troponin I (High Sensitivity): 24 ng/L — ABNORMAL HIGH (ref <18)

## 2020-10-02 LAB — BRAIN NATRIURETIC PEPTIDE: B Natriuretic Peptide: 3053.6 pg/mL — ABNORMAL HIGH (ref 0.0–100.0)

## 2020-10-02 LAB — D-DIMER, QUANTITATIVE: D-Dimer, Quant: 0.66 ug/mL-FEU — ABNORMAL HIGH (ref 0.00–0.50)

## 2020-10-02 MED ORDER — IOHEXOL 350 MG/ML SOLN
75.0000 mL | Freq: Once | INTRAVENOUS | Status: AC | PRN
  Administered 2020-10-02: 75 mL via INTRAVENOUS

## 2020-10-02 MED ORDER — FUROSEMIDE 10 MG/ML IJ SOLN
80.0000 mg | Freq: Once | INTRAMUSCULAR | Status: AC
  Administered 2020-10-02: 80 mg via INTRAVENOUS
  Filled 2020-10-02: qty 8

## 2020-10-02 MED ORDER — POTASSIUM CHLORIDE CRYS ER 20 MEQ PO TBCR
40.0000 meq | EXTENDED_RELEASE_TABLET | Freq: Once | ORAL | Status: AC
  Administered 2020-10-02: 40 meq via ORAL
  Filled 2020-10-02: qty 2

## 2020-10-02 MED ORDER — FUROSEMIDE 80 MG PO TABS: 80.0000 mg | ORAL_TABLET | Freq: Every day | ORAL | 0 refills | Status: DC

## 2020-10-02 MED ORDER — POTASSIUM CHLORIDE CRYS ER 20 MEQ PO TBCR: 20.0000 meq | EXTENDED_RELEASE_TABLET | Freq: Every day | ORAL | 0 refills | Status: DC

## 2020-10-02 NOTE — Consult Note (Addendum)
Advanced Heart Failure Team Consult Note   PCP:  Sharilyn Sites, MD  PCP-Cardiology: Dr. Haroldine Laws   Reason for Consultation: Acute on Chronic Systolic Heart Failure     HPI:    [name] is being seen today for a/c systolic heart failure at the request of Dr. Lou Miner, Emergency Medicine.   [name] is a 40 y.o.male with systolic HF due to  NICM, hypertension and CKD stage II-III (baseline Cr 1.5-1.7).   He was first diagnosed with HF in 2012 and his EF recovered and was instructed to stop taking carvedilol, spironolactone, and lasix by previous cardiologist. Admitted to [facility] 9/73/53 for acute systolic heart failure. 12/31/11 ProBNP 3777. HIV nonreactive. Thyroid panel normal. Renal ultrasound was negative for obstruction. EF 20%. Discharge weight 193 lbs. Cath with normal coronaries.  Admitted 05/30/2015 with HTN urgency with troponin elevation likely consistent with myocardial strain/demand ischemia in the setting of medication non compliance, having run out 2-3 weeks ago and not refilling them. Placed back on his coreg, hydralazine, and spiro and was symptomatically stable on discharge. No ACE/ARB with CKD and does not tolerate nitrates with headaches. Discharge weight 193 lbs.  Underwent TEE in 9/18 with severe AI. EF 40-45%. Cath with normal coronaries. S/P AV repair 05/11/2017 with Dr. Roxy Manns.  PYP scan negative 10/18  Echo 02/13/19. EF 45% w/ moderate AI. EF out of range for ICD. Echo 3/21 EF 45% moderate AI. LV dimensions stable.   Seen in clinic 09/14/19 and complained of intermittent palpitations. Echo stable  Zio patchNSR w/ 1 brief run of NSVT (4 beats), 4 brief runs of SVT and rare PVCs < 1% burden.   Last seen in [facility] 10/21 w/ stable NYHA Class I-II symptoms. He was instructed to start Farxiga 10 and reduce Lasix to 40 mg once daily. Unfortunately, he never started [place] and has been taking Lasix qod, but increasing dose to 80 mg on the days that he takes it.    He now presents to ED w/ complaints of increasing exertional dyspnea and orthopnea/PND along w/ intermittent palpitations and sharp atypical CP. Has felt more anxious lately and thought he was having panic attacks. He and his wife are expecting twin boys in several months. Will be first time father. He reports full med compliance but admits to dietary indiscretion w/ sodium. Eats out regularly. Denies any significant wt gain at home. No LEE.   In ED, BNP is markedly elevated at 3,053 (prior baseline 250-600 range). HS trop 25>>24. EKG NSR w/ LVH 68 bpm. CXR shows cardiomegaly but no overt edema. D-dimer 0.66. Subsequent Chest CT negative for PE but does show pleural effusions bilaterally though no appreciable edema or airspace consolidation.  There is dilatation of the ascending thoracic aorta with measured diameter of 4.9 x 4.7 cm. Measured diameter at the level of the sinuses of Valsalva is 4.8 cm. No dissection. No pericardial effusion or pericardial thickening. The main pulmonary outflow tract measures 4.9 cm, dilated in consistent with pulmonary arterial hypertension. SCr 1.56 c/w baseline. K 4.0. CO2 20. SBPs 299M-426S systolic.   ReDs Clip done in ED, mildly elevated at 41%. Bedside Echo done, EF appears mildly lower than prior study, 30-35%.   He was given 80 IV Lasix in ED w/ good UOP and symptomatic response.     Review of Systems: [y] = yes, [ ]  = no   . General: Weight gain [ ] ; Weight loss [ ] ; Anorexia [ ] ; Fatigue [ ] ; Fever [ ] ;  Chills [ ] ; Weakness [ ]   . Cardiac: Chest pain/pressure [ ] ; Resting SOB [Y ]; Exertional SOB [ Y]; Orthopnea [ Y]; Pedal Edema [ ] ; Palpitations [ ] ; Syncope [ ] ; Presyncope [ ] ; Paroxysmal nocturnal dyspnea[ Y]  . Pulmonary: Cough [ ] ; Wheezing[ ] ; Hemoptysis[ ] ; Sputum [ ] ; Snoring [ ]   . GI: Vomiting[ ] ; Dysphagia[ ] ; Melena[ ] ; Hematochezia [ ] ; Heartburn[ ] ; Abdominal pain [ ] ; Constipation [ ] ; Diarrhea [ ] ; BRBPR [ ]   . GU: Hematuria[ ] ; Dysuria  [ ] ; Nocturia[ ]   . Vascular: Pain in legs with walking [ ] ; Pain in feet with lying flat [ ] ; Non-healing sores [ ] ; Stroke [ ] ; TIA [ ] ; Slurred speech [ ] ;  . Neuro: Headaches[ ] ; Vertigo[ ] ; Seizures[ ] ; Paresthesias[ ] ;Blurred vision [ ] ; Diplopia [ ] ; Vision changes [ ]   . Ortho/Skin: Arthritis [ ] ; Joint pain [ ] ; Muscle pain [ ] ; Joint swelling [ ] ; Back Pain [ ] ; Rash [ ]   . Psych: Depression[ ] ; Anxiety[ ]   . Heme: Bleeding problems [ ] ; Clotting disorders [ ] ; Anemia [ ]   . Endocrine: Diabetes [ ] ; Thyroid dysfunction[ ]    Home Medications Prior to Admission medications   Medication Sig Start Date End Date Taking? Authorizing Provider  acetaminophen (TYLENOL) 500 MG tablet Take 1,000 mg by mouth every 6 (six) hours as needed for moderate pain or headache.    Yes [provider]  ALPRAZolam Duanne Moron) 0.5 MG tablet Take 0.5 mg by mouth at bedtime. 09/18/20  Yes [provider]  carvedilol (COREG) 25 MG tablet TAKE 1 TABLET BY MOUTH TWICE DAILY WITH MEALS Patient taking differently: Take 25 mg by mouth 2 (two) times daily with a meal. 09/23/20  Yes Bensimhon, Shaune Pascal, MD  diphenhydramine-acetaminophen (TYLENOL PM) 25-500 MG TABS tablet Take 1 tablet by mouth at bedtime as needed (sleep, pain).   Yes [provider]  ENTRESTO 97-103 MG Take 1 tablet by mouth twice daily 09/23/20  Yes Bensimhon, Shaune Pascal, MD  furosemide (LASIX) 40 MG tablet Take 1 tablet (40 mg total) by mouth daily. 05/06/20  Yes Bensimhon, Shaune Pascal, MD  hydrALAZINE (APRESOLINE) 25 MG tablet TAKE 1 TABLET BY MOUTH THREE TIMES DAILY Patient taking differently: Take 25 mg by mouth 3 (three) times daily. 09/23/20  Yes Bensimhon, Shaune Pascal, MD  dapagliflozin propanediol (FARXIGA) 10 MG TABS tablet Take 1 tablet (10 mg total) by mouth daily before breakfast. Patient not taking: No sig reported 05/06/20   Bensimhon, Shaune Pascal, MD    Past Medical History: Past Medical History:  Diagnosis Date  . Anxiety    . CHF (congestive heart failure) ([place])   . Chronic systolic heart failure ([place])   . CKD (chronic kidney disease) stage 2, GFR 60-89 ml/min   . Dyspnea    with value issues- "if I dont take my medication"  . Essential hypertension, benign   . Headache   . History of pneumonia   . Mitral regurgitation    Moderate  . Noncompliance   . Nonischemic cardiomyopathy ([place])    Normal coronaries May 2012, LVEF < 20%  . Pneumonia 2011  . S/P aortic valve repair 05/11/2017   Complex valvuloplasty including plication of left coronary leaflet and 2mm Biostable HAART ring annuloplasty    Past Surgical History: Past Surgical History:  Procedure Laterality Date  . AORTIC VALVE REPAIR N/A 05/11/2017   Procedure: AORTIC VALVE REPAIR;  Surgeon: Rexene Alberts, MD;  Location: [facility] OR;  Service: Open Heart Surgery;  Laterality: N/A;  . CARDIAC SURGERY    . RIGHT/LEFT HEART CATH AND CORONARY ANGIOGRAPHY N/A 04/20/2017   Procedure: RIGHT/LEFT HEART CATH AND CORONARY ANGIOGRAPHY;  Surgeon: Jolaine Artist, MD;  Location: [place] CV LAB;  Service: Cardiovascular;  Laterality: N/A;  . SURGERY SCROTAL / TESTICULAR     Testicular torsion  . TEE WITHOUT CARDIOVERSION N/A 04/09/2017   Procedure: TRANSESOPHAGEAL ECHOCARDIOGRAM (TEE);  Surgeon: Jolaine Artist, MD;  Location: [facility] ENDOSCOPY;  Service: Cardiovascular;  Laterality: N/A;  . TEE WITHOUT CARDIOVERSION N/A 05/11/2017   Procedure: TRANSESOPHAGEAL ECHOCARDIOGRAM (TEE);  Surgeon: Rexene Alberts, MD;  Location: [facility];  Service: Open Heart Surgery;  Laterality: N/A;    Family History:  Family History  Problem Relation Age of Onset  . Breast cancer Mother   . Stroke Father   . Hypertension Father     Social History: Social History   Socioeconomic History  . Marital status: Married    Spouse name: RaShannon  . Number of children: 0  . Years of education: Not on file  . Highest education level: Some college, no degree  Occupational  History    Employer: [employer]  Tobacco Use  . Smoking status: Never Smoker  . Smokeless tobacco: Never Used  Vaping Use  . Vaping Use: Never used  Substance and Sexual Activity  . Alcohol use: Yes    Alcohol/week: 0.0 standard drinks    Comment: occasional  . Drug use: No  . Sexual activity: Yes    Birth control/protection: Condom  Other Topics Concern  . Not on file  Social History Narrative   Occupation: just started sanitation job cleaning      Patient is right-handed. He lives with his wife in a 1 story house. He drinks 1-2 sodas a day.    Social Determinants of Health   Financial Resource Strain: Not on file  Food Insecurity: Not on file  Transportation Needs: Not on file  Physical Activity: Not on file  Stress: Not on file  Social Connections: Not on file    Allergies:  No Known Allergies  Objective:    Vital Signs:   Temp:  [97.9 F (36.6 C)] 97.9 F (36.6 C) (03/23 0848) Pulse Rate:  [71-82] 77 (03/23 1330) Resp:  [16-29] 19 (03/23 1330) BP: (117-144)/(70-84) 144/84 (03/23 1330) SpO2:  [96 %-99 %] 99 % (03/23 1330)   There were no vitals filed for this visit.   Physical Exam     General:  Well appearing young AAM. No respiratory difficulty HEENT: Normal Neck: Supple.  JVD elevated to jaw. Carotids 2+ bilat; no bruits. No lymphadenopathy or thyromegaly appreciated. Cor: PMI nondisplaced. Regular rate & rhythm. 3/6 AI murmur loudest LUSB Lungs: Clear Abdomen: Soft, nontender, nondistended. No hepatosplenomegaly. No bruits or masses. Good bowel sounds. Extremities: No cyanosis, clubbing, rash, edema, rt hand splint  Neuro: Alert & oriented x 3, cranial nerves grossly intact. moves all 4 extremities w/o difficulty. Affect pleasant.   Telemetry   NSR 80s-90s   EKG   NSR w/ LVH 68 bpm   Labs     Basic Metabolic Panel: Recent Labs  Lab 10/02/20 0851  NA 137  K 4.0  CL 110  CO2 20*  GLUCOSE 112*  BUN 21*  CREATININE 1.56*   CALCIUM 8.9    Liver Function Tests: No results for input(s): AST, ALT, ALKPHOS, BILITOT, PROT, ALBUMIN in the last 168 hours. No results for  input(s): LIPASE, AMYLASE in the last 168 hours. No results for input(s): AMMONIA in the last 168 hours.  CBC: Recent Labs  Lab 10/02/20 0851  WBC 4.6  HGB 14.0  HCT 43.5  MCV 84.8  PLT 242    Cardiac Enzymes: No results for input(s): CKTOTAL, CKMB, CKMBINDEX, TROPONINI in the last 168 hours.  BNP: BNP (last 3 results) Recent Labs    10/02/20 1050  BNP 3,053.6*    ProBNP (last 3 results) No results for input(s): PROBNP in the last 8760 hours.   CBG: No results for input(s): GLUCAP in the last 168 hours.  Coagulation Studies: No results for input(s): LABPROT, INR in the last 72 hours.  Imaging: DG Chest 2 View  Result Date: 10/02/2020 CLINICAL DATA:  Chest pain with shortness of breath and cardiac palpitations EXAM: CHEST - 2 VIEW COMPARISON:  June 07, 2020 FINDINGS: There is stable cardiomegaly. Patient is status post median sternotomy with aortic valve replacement. Pulmonary vascularity appears within normal limits. Lungs clear. No adenopathy. No bone lesions. IMPRESSION: Cardiomegaly. No edema or airspace opacity. Status post median sternotomy with aortic valve replacement. Electronically Signed   By: Lowella Grip III M.D.   On: 10/02/2020 09:41   CT Angio Chest PE W/Cm &/Or Wo Cm  Result Date: 10/02/2020 CLINICAL DATA:  Chest pain and shortness of breath. Elevated D-dimer EXAM: CT ANGIOGRAPHY CHEST WITH CONTRAST TECHNIQUE: Multidetector CT imaging of the chest was performed using the standard protocol during bolus administration of intravenous contrast. Multiplanar CT image reconstructions and MIPs were obtained to evaluate the vascular anatomy. CONTRAST:  76mL OMNIPAQUE IOHEXOL 350 MG/ML SOLN COMPARISON:  October 02, 2020 chest radiograph CT angiogram chest Nov 23, 2010 FINDINGS: Cardiovascular: There is no  appreciable pulmonary embolus. The patient is status post aortic valve replacement. There is dilatation of the ascending thoracic aorta with measured diameter of 4.9 x 4.7 cm. Measured diameter at the level of the sinuses of Valsalva is 4.8 cm. No dissection evident; the contrast bolus in the aorta is not sufficient for dissection assessment. Scattered foci of calcification are noted in visualized great vessels. There is generalized cardiac enlargement. No pericardial effusion or pericardial thickening. The main pulmonary outflow tract measures 4.9 cm, dilated in consistent with pulmonary arterial hypertension. Mediastinum/Nodes: Visualized thyroid appears normal. There are scattered subcentimeter mediastinal lymph nodes. No evident adenopathy by size criteria. No evident esophageal lesions. Lungs/Pleura: There are pleural effusions bilaterally. There is no appreciable edema or airspace consolidation. There is a 3 mm granuloma in the anterior segment of the left upper lobe. There is mild bibasilar atelectasis. There are scattered areas of mosaic attenuation in the lower lobes. No pneumothorax. Trachea and major bronchial structures appear patent. Upper Abdomen: There is reflux of contrast into the inferior vena cava and hepatic veins. There is upper abdominal aortic atherosclerosis. Visualized upper abdominal structures otherwise appear unremarkable. Musculoskeletal: Status post median sternotomy. No blastic or lytic bone lesions. No chest wall lesions. Review of the MIP images confirms the above findings. IMPRESSION: 1.  No demonstrable pulmonary embolus. 2. Status post aortic valve replacement. Prominence of the ascending thoracic aorta with measured diameter 4.9 x 4.7 cm. Ascending thoracic aortic aneurysm. Recommend semi-annual imaging followup by CTA or MRA and referral to cardiothoracic surgery if not already obtained. This recommendation follows 2010 ACCF/AHA/AATS/ACR/ASA/SCA/SCAI/SIR/STS/SVM Guidelines for  the Diagnosis and Management of Patients With Thoracic Aortic Disease. Circulation. 2010; 121: P536-R443. Aortic aneurysm NOS (ICD10-I71.9). No appreciable dissection. Note that the contrast bolus in  the aorta is not sufficient for dissection assessment. 3.  Foci of aortic atherosclerosis. 4.  Cardiomegaly.  No pericardial effusion. 5. Enlargement of the main pulmonary outflow tract to 4.9 cm, an appearance indicative of pulmonary arterial hypertension. 6. Pleural effusions bilaterally. Areas of atelectatic change. No consolidation. Scattered areas of mosaic attenuation may indicate a degree of underlying small airways obstructive disease. Small granuloma left upper lobe. 7. Reflux of contrast in the inferior vena cava and hepatic veins is a finding felt to be indicative of increased right heart pressure. 8.  No adenopathy. Aortic Atherosclerosis (ICD10-I70.0). Electronically Signed   By: Lowella Grip III M.D.   On: 10/02/2020 13:14      Assessment/Plan   1. Acute on Chronic Systolic Heart Failure NICM,  ECHO ~20% in 2013. EF 45-50% in 2016. Suspect due to ongoing AI +/- HTN - s/p AoV repair 10/18. F/u echo 12/18 with EF 45-50% moderate AI - ECHO 2019 EF 30-35% Mod AI.  - ECHO 02/2019 EF 45% (out of range for ICD) - Echo 3/21 EF 45% moderate AI. LV dimensions stable. RV moderately reduced   -PYP scan negative for amyloid.  - Now NYHA Class II-early III. BNP high at 3,053 (prior baseline 250-600 range), though no overt edema on imaging. - ReDs Clip elevated at 41%. Bedside Echo w/ EF ~30-35% - Good response to IV Lasix 80 mg in ED w/ symptomatic improvement  - Ok for d/c home from ED w/ titration of home lasix regimen and close clinic f/u - Increase home Lasix to 80 mg daily  - Add 20 mEQ of KCl daily  - Continue Entresto 97-103 mg bid - Continue Hydralazine 25 mg tid. Not on nitrate due to intolerance (HAs) - Continue Coreg 25 mg bid - Discussed importance of low sodium diet  - Will will  arrange post ED f/u in the AHF Clinic next week to reassess volume status - Will try to start [place] at next clinic visit    2. Aortic Insuffiencey S/p Repair - s/p repair by Dr. Roxy Manns in 2018 - Last echo 02/2020 showed mod AI  3. Dilated Ascending Aortic Dilation  -  Chest CT today shows dilatation of the ascending thoracic aorta with measured diameter of 4.9 x 4.7 cm. Measured diameter at the level of the sinuses of Valsalva is 4.8 cm. No dissection. - Will need referral back to CT surgery for annual surveillance.  - BP/HR well controlled  4. Stage III CKD - Baseline SCr 1.6, stable  5. HTN - controlled on current regimen   Ok for d/c home from ED. We will place f/u Appt Info in AVS   Cardiac Meds for Discharge Lasix 80 mg daily (increased dose) KCl 20 mEq daily (new) Entresto 97-103 mg bid  Hydralazine 25 mg tid  Coreg 25 mg bid    Lyda Jester, PA-C 10/02/2020, 1:52 PM  Advanced Heart Failure Team Pager [phone] (M-F; 7a - 5p)  Please contact [facility] for night-coverage after hours (4p -7a ) and weekends on [email or web address]   Patient seen and examined with the above-signed Advanced Practice Provider and/or Housestaff. I personally reviewed laboratory data, imaging studies and relevant notes. I independently examined the patient and formulated the important aspects of the plan. I have edited the note to reflect any of my changes or salient points. I have personally discussed the plan with the patient and/or family.  40 y/o with systolic HF and AI s/p AoV repair presents to ER with ADHF in  setting of cutting back his lasix.   We performed bedside echo in ER  EF~35% though suboptimal windows. ReDS 42%   We gave IV lasix in ER with brisk response. Breathing much better.   General:  Well appearing. No resp difficulty HEENT: normal Neck: supple. JVP 10 Carotids 2+ bilat; no bruits. No lymphadenopathy or thryomegaly appreciated. Cor: PMI nondisplaced. Regular rate &  rhythm. No rubs, gallops or murmurs. Lungs: clear Abdomen: soft, nontender, nondistended. No hepatosplenomegaly. No bruits or masses. Good bowel sounds. Extremities: no cyanosis, clubbing, rash, 1-2+ edema Neuro: alert & orientedx3, cranial nerves grossly intact. moves all 4 extremities w/o difficulty. Affect pleasant  He has mild to moderate volume overload in setting of cutting back his diuretics. Echo essentially stable. No high risk features. Responding well to IV lasix. Will increase lasix to 80mg  po daily (was taking qOD). F/u in HF Clinic next week. Hopefully can get him on SGLT2i and cut back lasix.   Discussed personally with ED MD.   Glori Bickers, MD  10:08 PM

## 2020-10-02 NOTE — ED Provider Notes (Signed)
[facility] EMERGENCY DEPARTMENT Provider Note   CSN: [id number] Arrival date & time: 10/02/20  7672     History No chief complaint on file.   [name] [name] is a 40 y.o. male with PMHx HTN, CKD stage II, CHF with EF 45%, nonischemic cardiomyopathy who presents to the ED today with complaint of sudden onset, intermittent, palpitations and left sided chest pain x 1 month.  Patient also complains of shortness of breath.  He believes he feels like his heart starts beating fast and the knee will have some pain and get the thinking about it and then feel anxious and start having some shortness of breath.  He states that the last episode of chest pain was approximately 2 nights ago.  He cannot tell me how long it lasts.  He denies any diaphoresis, nausea, vomiting.  Per chart review patient did have a ZIO monitor last year which did show some runs of non-SVT as well as SVT however patient does not recall this whatsoever.  He states that the last time he wore a monitor was in 2012 or 2013.  He states that this is when he was first diagnosed with congestive heart failure.  He has been compliant with his medications and has not missed any doses.  Patient is a never smoker.  He denies any family history of CAD.  He denies any history of DVT/PE.  No recent prolonged travel or immobilization.  No hemoptysis.  No exogenous hormone use.  No active malignancy.  Zio XT Report 1. Sinus rhythm -  avg HR of 76 2. One run of NSVT - lasting 4 beats with a max rate of 174 bpm (avg 170 bpm).  3. Four runs of SVT  occurred, the longest lasting 9 beats with an avg rate of 133 bpm.  4. Rare PACs and PVCs with periods of bigeminy & trigeminy. 5. Patient triggered events associated with NSR, PVCs and PACs,   The history is provided by the patient and medical records.       Past Medical History:  Diagnosis Date  . Anxiety   . CHF (congestive heart failure) ([place])   . Chronic systolic heart  failure ([place])   . CKD (chronic kidney disease) stage 2, GFR 60-89 ml/min   . Dyspnea    with value issues- "if I dont take my medication"  . Essential hypertension, benign   . Headache   . History of pneumonia   . Mitral regurgitation    Moderate  . Noncompliance   . Nonischemic cardiomyopathy ([place])    Normal coronaries May 2012, LVEF < 20%  . Pneumonia 2011  . S/P aortic valve repair 05/11/2017   Complex valvuloplasty including plication of left coronary leaflet and 77mm Biostable HAART ring annuloplasty    Patient Active Problem List   Diagnosis Date Noted  . S/P aortic valve repair 05/11/2017  . Aortic valve regurgitation   . Chronic systolic CHF (congestive heart failure) ([place])   . Hypertensive emergency 05/30/2015  . Essential hypertension 11/22/2014  . Shift work sleep disorder 10/30/2014  . Obstructive sleep apnea 10/30/2014  . Hypertensive crisis 09/26/2014  . Hypertensive urgency 06/13/2013  . CHF (congestive heart failure) ([place]) 06/13/2013  . CKD (chronic kidney disease) stage 3, GFR 30-59 ml/min (H[facility]) 06/13/2013  . Nonischemic cardiomyopathy ([place]) 06/13/2013  . HTN (hypertension) 02/25/2012  . Chronic combined systolic and diastolic heart failure ([place]) 01/08/2012  . Elevated troponin 01/01/2012  . Hypokalemia 12/31/2011  . Chest  pain 12/31/2011    Past Surgical History:  Procedure Laterality Date  . AORTIC VALVE REPAIR N/A 05/11/2017   Procedure: AORTIC VALVE REPAIR;  Surgeon: Rexene Alberts, MD;  Location: [place];  Service: Open Heart Surgery;  Laterality: N/A;  . CARDIAC SURGERY    . RIGHT/LEFT HEART CATH AND CORONARY ANGIOGRAPHY N/A 04/20/2017   Procedure: RIGHT/LEFT HEART CATH AND CORONARY ANGIOGRAPHY;  Surgeon: Jolaine Artist, MD;  Location: [place] CV LAB;  Service: Cardiovascular;  Laterality: N/A;  . SURGERY SCROTAL / TESTICULAR     Testicular torsion  . TEE WITHOUT CARDIOVERSION N/A 04/09/2017   Procedure: TRANSESOPHAGEAL ECHOCARDIOGRAM  (TEE);  Surgeon: Jolaine Artist, MD;  Location: [facility] ENDOSCOPY;  Service: Cardiovascular;  Laterality: N/A;  . TEE WITHOUT CARDIOVERSION N/A 05/11/2017   Procedure: TRANSESOPHAGEAL ECHOCARDIOGRAM (TEE);  Surgeon: Rexene Alberts, MD;  Location: [place];  Service: Open Heart Surgery;  Laterality: N/A;       Family History  Problem Relation Age of Onset  . Breast cancer Mother   . Stroke Father   . Hypertension Father     Social History   Tobacco Use  . Smoking status: Never Smoker  . Smokeless tobacco: Never Used  Vaping Use  . Vaping Use: Never used  Substance Use Topics  . Alcohol use: Yes    Alcohol/week: 0.0 standard drinks    Comment: occasional  . Drug use: No    Home Medications Prior to Admission medications   Medication Sig Start Date End Date Taking? Authorizing Provider  acetaminophen (TYLENOL) 500 MG tablet Take 1,000 mg by mouth every 6 (six) hours as needed for moderate pain or headache.    Yes [provider]  ALPRAZolam Duanne Moron) 0.5 MG tablet Take 0.5 mg by mouth at bedtime. 09/18/20  Yes [provider]  carvedilol (COREG) 25 MG tablet TAKE 1 TABLET BY MOUTH TWICE DAILY WITH MEALS Patient taking differently: Take 25 mg by mouth 2 (two) times daily with a meal. 09/23/20  Yes Bensimhon, Shaune Pascal, MD  diphenhydramine-acetaminophen (TYLENOL PM) 25-500 MG TABS tablet Take 1 tablet by mouth at bedtime as needed (sleep, pain).   Yes [provider]  ENTRESTO 97-103 MG Take 1 tablet by mouth twice daily 09/23/20  Yes Bensimhon, Shaune Pascal, MD  furosemide (LASIX) 40 MG tablet Take 1 tablet (40 mg total) by mouth daily. 05/06/20  Yes Bensimhon, Shaune Pascal, MD  hydrALAZINE (APRESOLINE) 25 MG tablet TAKE 1 TABLET BY MOUTH THREE TIMES DAILY Patient taking differently: Take 25 mg by mouth 3 (three) times daily. 09/23/20  Yes Bensimhon, Shaune Pascal, MD  dapagliflozin propanediol (FARXIGA) 10 MG TABS tablet Take 1 tablet (10 mg total) by mouth daily before  breakfast. Patient not taking: No sig reported 05/06/20   Bensimhon, Shaune Pascal, MD    Allergies    Patient has no known allergies.  Review of Systems   Review of Systems  Constitutional: Negative for chills, diaphoresis and fever.  Respiratory: Positive for shortness of breath.   Cardiovascular: Positive for chest pain and palpitations. Negative for leg swelling.  Gastrointestinal: Negative for nausea and vomiting.  All other systems reviewed and are negative.   Physical Exam Updated Vital Signs BP 122/72 (BP Location: Left Arm)   Pulse 77   Temp 97.9 F (36.6 C) (Oral)   Resp 16   SpO2 99%   Physical Exam Vitals and nursing note reviewed.  Constitutional:      Appearance: He is not ill-appearing or  diaphoretic.  HENT:     Head: Normocephalic and atraumatic.  Eyes:     Conjunctiva/sclera: Conjunctivae normal.  Cardiovascular:     Rate and Rhythm: Normal rate and regular rhythm.     Pulses: Normal pulses.  Pulmonary:     Effort: Pulmonary effort is normal.     Breath sounds: Normal breath sounds. No wheezing, rhonchi or rales.  Abdominal:     Palpations: Abdomen is soft.     Tenderness: There is no abdominal tenderness. There is no guarding or rebound.  Musculoskeletal:     Cervical back: Neck supple.     Right lower leg: No edema.     Left lower leg: No edema.  Skin:    General: Skin is warm and dry.  Neurological:     Mental Status: He is alert.     ED Results / Procedures / Treatments   Labs (all labs ordered are listed, but only abnormal results are displayed) Labs Reviewed  BASIC METABOLIC PANEL - Abnormal; Notable for the following components:      Result Value   CO2 20 (*)    Glucose, Bld 112 (*)    BUN 21 (*)    Creatinine, Ser 1.56 (*)    GFR, Estimated 58 (*)    All other components within normal limits  CBC - Abnormal; Notable for the following components:   RDW 15.6 (*)    All other components within normal limits  BRAIN NATRIURETIC  PEPTIDE - Abnormal; Notable for the following components:   B Natriuretic Peptide 3,053.6 (*)    All other components within normal limits  D-DIMER, QUANTITATIVE - Abnormal; Notable for the following components:   D-Dimer, Quant 0.66 (*)    All other components within normal limits  TROPONIN I (HIGH SENSITIVITY) - Abnormal; Notable for the following components:   Troponin I (High Sensitivity) 25 (*)    All other components within normal limits  TROPONIN I (HIGH SENSITIVITY) - Abnormal; Notable for the following components:   Troponin I (High Sensitivity) 24 (*)    All other components within normal limits    EKG EKG Interpretation  Date/Time:  Wednesday October 02 2020 09:02:49 EDT Ventricular Rate:  68 PR Interval:  204 QRS Duration: 124 QT Interval:  432 QTC Calculation: 459 R Axis:   98 Text Interpretation: Normal sinus rhythm Rightward axis Left ventricular hypertrophy with QRS widening and repolarization abnormality ( Cornell product , Romhilt-Estes ) No significant change since last tracing Confirmed by Blanchie Dessert [phone]) on 10/02/2020 10:32:36 AM   Radiology DG Chest 2 View  Result Date: 10/02/2020 CLINICAL DATA:  Chest pain with shortness of breath and cardiac palpitations EXAM: CHEST - 2 VIEW COMPARISON:  June 07, 2020 FINDINGS: There is stable cardiomegaly. Patient is status post median sternotomy with aortic valve replacement. Pulmonary vascularity appears within normal limits. Lungs clear. No adenopathy. No bone lesions. IMPRESSION: Cardiomegaly. No edema or airspace opacity. Status post median sternotomy with aortic valve replacement. Electronically Signed   By: Lowella Grip III M.D.   On: 10/02/2020 09:41   CT Angio Chest PE W/Cm &/Or Wo Cm  Result Date: 10/02/2020 CLINICAL DATA:  Chest pain and shortness of breath. Elevated D-dimer EXAM: CT ANGIOGRAPHY CHEST WITH CONTRAST TECHNIQUE: Multidetector CT imaging of the chest was performed using the standard  protocol during bolus administration of intravenous contrast. Multiplanar CT image reconstructions and MIPs were obtained to evaluate the vascular anatomy. CONTRAST:  72mL OMNIPAQUE IOHEXOL 350 MG/ML SOLN COMPARISON:  October 02, 2020 chest radiograph CT angiogram chest Nov 23, 2010 FINDINGS: Cardiovascular: There is no appreciable pulmonary embolus. The patient is status post aortic valve replacement. There is dilatation of the ascending thoracic aorta with measured diameter of 4.9 x 4.7 cm. Measured diameter at the level of the sinuses of Valsalva is 4.8 cm. No dissection evident; the contrast bolus in the aorta is not sufficient for dissection assessment. Scattered foci of calcification are noted in visualized great vessels. There is generalized cardiac enlargement. No pericardial effusion or pericardial thickening. The main pulmonary outflow tract measures 4.9 cm, dilated in consistent with pulmonary arterial hypertension. Mediastinum/Nodes: Visualized thyroid appears normal. There are scattered subcentimeter mediastinal lymph nodes. No evident adenopathy by size criteria. No evident esophageal lesions. Lungs/Pleura: There are pleural effusions bilaterally. There is no appreciable edema or airspace consolidation. There is a 3 mm granuloma in the anterior segment of the left upper lobe. There is mild bibasilar atelectasis. There are scattered areas of mosaic attenuation in the lower lobes. No pneumothorax. Trachea and major bronchial structures appear patent. Upper Abdomen: There is reflux of contrast into the inferior vena cava and hepatic veins. There is upper abdominal aortic atherosclerosis. Visualized upper abdominal structures otherwise appear unremarkable. Musculoskeletal: Status post median sternotomy. No blastic or lytic bone lesions. No chest wall lesions. Review of the MIP images confirms the above findings. IMPRESSION: 1.  No demonstrable pulmonary embolus. 2. Status post aortic valve replacement.  Prominence of the ascending thoracic aorta with measured diameter 4.9 x 4.7 cm. Ascending thoracic aortic aneurysm. Recommend semi-annual imaging followup by CTA or MRA and referral to cardiothoracic surgery if not already obtained. This recommendation follows 2010 ACCF/AHA/AATS/ACR/ASA/SCA/SCAI/SIR/STS/SVM Guidelines for the Diagnosis and Management of Patients With Thoracic Aortic Disease. Circulation. 2010; 121: Q330-Q762. Aortic aneurysm NOS (ICD10-I71.9). No appreciable dissection. Note that the contrast bolus in the aorta is not sufficient for dissection assessment. 3.  Foci of aortic atherosclerosis. 4.  Cardiomegaly.  No pericardial effusion. 5. Enlargement of the main pulmonary outflow tract to 4.9 cm, an appearance indicative of pulmonary arterial hypertension. 6. Pleural effusions bilaterally. Areas of atelectatic change. No consolidation. Scattered areas of mosaic attenuation may indicate a degree of underlying small airways obstructive disease. Small granuloma left upper lobe. 7. Reflux of contrast in the inferior vena cava and hepatic veins is a finding felt to be indicative of increased right heart pressure. 8.  No adenopathy. Aortic Atherosclerosis (ICD10-I70.0). Electronically Signed   By: Lowella Grip III M.D.   On: 10/02/2020 13:14    Procedures Procedures   Medications Ordered in ED Medications  iohexol (OMNIPAQUE) 350 MG/ML injection 75 mL (75 mLs Intravenous Contrast Given 10/02/20 1250)  furosemide (LASIX) injection 80 mg (80 mg Intravenous Given 10/02/20 1508)  potassium chloride SA (KLOR-CON) CR tablet 40 mEq (40 mEq Oral Given 10/02/20 1511)    ED Course  I have reviewed the triage vital signs and the nursing notes.  Pertinent labs & imaging results that were available during my care of the patient were reviewed by me and considered in my medical decision making (see chart for details).    MDM Rules/Calculators/A&P                          40 year old male who  presents to the ED today with complaint of intermittent palpitations, left-sided chest pain, shortness of breath for the past month.  Last episode approximately 2 days ago.  On arrival to the ED  vitals are stable.  Patient afebrile at 97.9, nontachycardic with a heart rate of 71, nontachypneic.  Blood pressure stable 121/72.  EKG with normal sinus rhythm at 68, no acute ischemic changes.  Chest x-ray with cardiomegaly, no other acute findings.  Troponin of 25 initially.  My exam patient is resting comfortably in no acute distress.  He has equal pulses bilaterally.  His lungs are clear to auscultation bilaterally.  He denies any risk factors regarding PE or DVT at this time however given concern for palpitations will plan for dimer today.  Will add on BNP as well given history of CHF.  It does appear that patient had a CO monitor last year in 2021 which did show a couple episodes of non-SVT as well as SVT however patient cannot recall this. He saw Dr. Haroldine Laws for same.  D dimer elevated at 0.66. CTA ordered at this time BNP significantly elevated at 3,053.6. Will await CTA at this time as pt may be having right heart strain Repeat troponin 24  CTA: IMPRESSION:  1. No demonstrable pulmonary embolus.    2. Status post aortic valve replacement. Prominence of the ascending  thoracic aorta with measured diameter 4.9 x 4.7 cm. Ascending  thoracic aortic aneurysm. Recommend semi-annual imaging followup by  CTA or MRA and referral to cardiothoracic surgery if not already  obtained. This recommendation follows 2010  ACCF/AHA/AATS/ACR/ASA/SCA/SCAI/SIR/STS/SVM Guidelines for the  Diagnosis and Management of Patients With Thoracic Aortic Disease.  Circulation. 2010; 121: J696-V893. Aortic aneurysm NOS  (ICD10-I71.9). No appreciable dissection. Note that the contrast  bolus in the aorta is not sufficient for dissection assessment.    3. Foci of aortic atherosclerosis.    4. Cardiomegaly. No  pericardial effusion.    5. Enlargement of the main pulmonary outflow tract to 4.9 cm, an  appearance indicative of pulmonary arterial hypertension.    6. Pleural effusions bilaterally. Areas of atelectatic change. No  consolidation. Scattered areas of mosaic attenuation may indicate a  degree of underlying small airways obstructive disease. Small  granuloma left upper lobe.    7. Reflux of contrast in the inferior vena cava and hepatic veins is  a finding felt to be indicative of increased right heart pressure.    8. No adenopathy.   Given elevated BNP have discussed admission with pt however he is hesitant as he feels like he cannot miss work. It does appear that pt was taking 40 mg Lasix BID however he saw his cardiologist in October and decreased to 40 mg once daily with addition of Farxiga. Pt admits that he has been taking 40 mg BID every other day and has not been taking the [place] whatsoever. Will plan to discuss with pt's cardiologist Dr. Haroldine Laws to see if pt is candidate for at home IV diuresis given his hesitancy for admission however I suspect admission is likely the most appropriate course.   Discussed case with [place] [facility] PA-C who will come evaluate patient however suspects he will require admission given significantly elevated BNP. She is under the impression that the home IV program is no longer accepting patients.  Cardiology has evaluated patient - they are planning for bedside ECHO at this time to assess ReDS clip as well as LV function and will touch base regarding disposition at that time.  Has provided 80 mg IV lasix.   At shift change case signed out to oncoming provider Marko Plume, PA-C, who will dispo pt accordingly.   This note was prepared using Dragon  voice recognition software and may include unintentional dictation errors due to the inherent limitations of voice recognition software.  Final Clinical Impression(s) / ED Diagnoses Final  diagnoses:  Acute on chronic congestive heart failure, unspecified heart failure type [facility])    Rx / DC Orders ED Discharge Orders    None       Eustaquio Maize, PA-C 10/02/20 1531    Blanchie Dessert, MD 10/03/20 1610

## 2020-10-02 NOTE — Discharge Instructions (Addendum)
Please follow up with your vascular surgeon regarding your ED visit today and findings of thoracic aortic aneurysm on CT scan. It is recommended that you have an annual follow up with repeat imaging.   You should be seen in the heart failure clinic in 1 week as discussed with the cardiologist today.  Additionally been prescribed 80 mg of Lasix to take daily at home along with daily potassium supplementation.  Please take these as prescribed.    Return to the emergency department if you develop any new chest pain, palpitations, difficulty breathing, nausea vomiting that does not stop, or any other severe symptoms.

## 2020-10-02 NOTE — Progress Notes (Signed)
Heart Failure Navigator Progress Note   ReDS Vest / Clip - 10/02/20 1500      ReDS Vest / Clip   Station Marker D    Ruler Value 36    ReDS Value Range High volume overload    ReDS Actual Value 41

## 2020-10-02 NOTE — ED Provider Notes (Signed)
  Physical Exam  BP 127/68   Pulse 79   Temp 97.9 F (36.6 C) (Oral)   Resp 19   SpO2 99%    ED Course/Procedures     Procedures  MDM   Care of this patient assumed from preceding ED provider, M. Alroy Bailiff, PA-C at time of shift change.  Please see her associated note for further insight this patient's ED course.  In brief, 40 year old male with history of CHF with EF of 45% as well as hypertension, CKD stage II, and nonischemic cardiomyopathy presented to the emergency department today for sudden onset intermittent palpitations and left-sided chest pain.  Patient appeared fluid overloaded.  Recently, dose of Lasix was decreased and he was prescribed Farxiga, however unfortunately he did not begin the [place] as he did not wish to start a new medication.  He has been taking 80 mg of Lasix every other day but has significantly elevated BNP at this time.  CBC is unremarkable, BMP with very mild bump in patient's creatinine from baseline,  troponin mildly elevated to 24, flat at patient's baseline, significantly elevated BNP to 3053, and elevated D-dimer to 0.66.  Patient underwent a CT angiogram which was negative for pulmonary embolus, but revealed known pulmonary hypertension and pleural effusions bilaterally as well as signs of increased right heart pressure.  At time of shift change patient is awaiting heart failure team evaluation with bedside echo as he is adamant he does not want to be admitted to the hospital.  I am awaiting their recommendations for disposition and treatment plan.  Heart failure physician, Dr. Shan Levans and APP Rosita Fire, PA-C, communicated with this provider after their evaluation of the patient.  They feel he is safe to be discharged home at this time with prescription for 80 mg of Lasix to be taken daily, as well as prescription for 20 MeQ potassium to be taken daily with his Lasix.  He will be seen in the heart failure clinic in follow-up in 1 week.  I appreciate the  collaboration in the care of this patient.  Mr. Saini voiced understanding of his medical evaluation and treatment plan.  Each of his questions answered to his expressed satisfaction.  Return precautions given.  Patient is stable and appropriate for discharge at this time.  This chart was dictated using voice recognition software, Dragon. Despite the best efforts of this provider to proofread and correct errors, errors may still occur which can change documentation meaning.       Emeline Darling, PA-C 10/02/20 1631    Lorelle Gibbs, DO 10/03/20 1617

## 2020-10-02 NOTE — ED Triage Notes (Signed)
Patient complains of intermittent chest pain with palpitations x 1 month. States that it comes and goes almost daily, hx CHF

## 2020-10-08 ENCOUNTER — Encounter (HOSPITAL_COMMUNITY): Payer: BC Managed Care – PPO

## 2020-10-08 NOTE — Progress Notes (Incomplete)
Advanced Heart Failure Clinic Note   Patient ID: [name] [name], male   DOB: 03/07/1981, 40 y.o.   MRN: [id number]  Primary PCP: [name]  Primary Cardiologist: Dr. Haroldine [name]  Reason for Visit: Post ED f/u for a/c systolic heart failure   HPI: [name] [name] is a 40 y.o.male with systolic HF due to  NICM, hypertension and CKD stage II-III (baseline Cr 1.5-1.7).   He was first diagnosed with HF in 2012 and his EF recovered and was instructed to stop taking carvedilol, spironolactone, and lasix by previous cardiologist. Admitted to [name] 0/25/42 for acute systolic heart failure. 12/31/11 ProBNP 3777. HIV nonreactive. Thyroid panel normal. Renal ultrasound was negative for obstruction. EF 20%. Discharge weight 193 lbs. Cath with normal coronaries.  Admitted 05/30/2015 with HTN urgency with troponin elevation likely consistent with myocardial strain/demand ischemia in the setting of medication non compliance, having run out 2-3 weeks ago and not refilling them. Placed back on his coreg, hydralazine, and spiro and was symptomatically stable on discharge. No ACE/ARB with CKD and does not tolerate nitrates with headaches. Discharge weight 193 lbs.  Underwent TEE in 9/18 with severe AI. EF 40-45%. Cath with normal coronaries. S/P AV repair 05/11/2017 with Dr. Roxy [name].  PYP scan negative 10/18  Echo 02/13/19. EF 45% w/ moderate AI. EF out of range for ICD. Echo 3/21 EF 45% moderate AI. LV dimensions stable.   Seen in clinic 09/14/19 and complained of intermittent palpitations. Echo stable  Zio patchNSR w/ 1 brief run of NSVT (4 beats), 4 brief runs of SVT and rare PVCs < 1% burden.   Last seen in [name] 10/21 w/ stable NYHA Class I-II symptoms. He was instructed to start Farxiga 10 and reduce Lasix to 40 mg once daily. Unfortunately, he never started [place] and had been taking Lasix qod, but increasing dose to 80 mg on the days that he takes it.   He ended up in the [name] last week on 3/23 w/  complaints of  increasing exertional dyspnea and orthopnea/PND along w/ intermittent palpitations and sharp atypical CP. He reported full med compliance but admited to dietary indiscretion w/ sodium. He was found to be moderately fluid overloaded.  ReDs Clip done in ED, mildly elevated at 41%. Bedside Echo done, EF appears mildly lower than prior study, 30-35%. BNP was markedly elevated at 3,053 (prior baseline 250-600 range). HS trop 25>>24. EKG NSR w/ LVH 68 bpm. CXR showed cardiomegaly but no overt edema. D-dimer 0.66. Subsequent Chest CT negative for PE but does show pleural effusions bilaterally though no appreciable edema or airspace consolidation.  There is dilatation of the ascending thoracic aorta with measured diameter of 4.9 x 4.7 cm. Measured diameter at the level of the sinuses of Valsalva is 4.8 cm. No dissection. No pericardial effusion or pericardial thickening. The main pulmonary outflow tract measures 4.9 cm, dilated in consistent with pulmonary arterial hypertension. SCr 1.56 c/w baseline. K 4.0. CO2 20. SBPs [id number] systolic.    He was given 80 IV Lasix in ED w/ good UOP and symptomatic response. He was felt to be stable for d/c home from ED. We increased his lasix to 80 mg daily.   He now returns to clinic for post ED f/u.      ECHO 06/13/13: EF 20-25% ECHO 09/26/2014: EF 40%  ECHO 05/31/15 EF 20-25% Echo 11/17 EF 45-50% ECHO 03/2017 EF ~45% Severe AI  ECHO 06/23/2017: EF 45-50% . Grade II DD ECHO 2019 EF 30-35%  Moderate aortic regurgitation.  RV moderately reduced.  Echo 02/2019, EF 45%, moderate AI  Review of systems complete and found to be negative unless listed in HPI.   Past Medical History:  Diagnosis Date  . Anxiety   . CHF (congestive heart failure) ([name])   . Chronic systolic heart failure ([name])   . CKD (chronic kidney disease) stage 2, GFR 60-89 ml/min   . Dyspnea    with value issues- "if I dont take my medication"  . Essential hypertension, benign   .  Headache   . History of pneumonia   . Mitral regurgitation    Moderate  . Noncompliance   . Nonischemic cardiomyopathy ([place] [name])    Normal coronaries May 2012, LVEF < 20%  . Pneumonia 2011  . S/P aortic valve repair 05/11/2017   Complex valvuloplasty including plication of left coronary leaflet and 73mm Biostable HAART ring annuloplasty    Current Outpatient Medications  Medication Sig Dispense Refill  . acetaminophen (TYLENOL) 500 MG tablet Take 1,000 mg by mouth every 6 (six) hours as needed for moderate pain or headache.     . ALPRAZolam (XANAX) 0.5 MG tablet Take 0.5 mg by mouth at bedtime.    . carvedilol (COREG) 25 MG tablet TAKE 1 TABLET BY MOUTH TWICE DAILY WITH MEALS (Patient taking differently: Take 25 mg by mouth 2 (two) times daily with a meal.) 60 tablet 11  . dapagliflozin propanediol (FARXIGA) 10 MG TABS tablet Take 1 tablet (10 mg total) by mouth daily before breakfast. (Patient not taking: No sig reported) 30 tablet 5  . diphenhydramine-acetaminophen (TYLENOL PM) 25-500 MG TABS tablet Take 1 tablet by mouth at bedtime as needed (sleep, pain).    Marland Kitchen ENTRESTO 97-103 MG Take 1 tablet by mouth twice daily 60 tablet 11  . furosemide (LASIX) 80 MG tablet Take 1 tablet (80 mg total) by mouth daily. 30 tablet 0  . hydrALAZINE (APRESOLINE) 25 MG tablet TAKE 1 TABLET BY MOUTH THREE TIMES DAILY (Patient taking differently: Take 25 mg by mouth 3 (three) times daily.) 90 tablet 11  . potassium chloride SA (KLOR-CON) 20 MEQ tablet Take 1 tablet (20 mEq total) by mouth daily. 30 tablet 0   No current facility-administered medications for this visit.    There were no vitals filed for this visit. Wt Readings from Last 3 Encounters:  06/19/20 104.3 kg (230 lb)  06/07/20 104.3 kg (230 lb)  05/06/20 106.1 kg (233 lb 12.8 oz)    PHYSICAL EXAM: General:  Well appearing. No respiratory difficulty HEENT: normal Neck: supple. no JVD. Carotids 2+ bilat; no bruits. No lymphadenopathy or  thyromegaly appreciated. Cor: PMI nondisplaced. Regular rate & rhythm. No rubs, gallops or murmurs. Lungs: clear Abdomen: soft, nontender, nondistended. No hepatosplenomegaly. No bruits or masses. Good bowel sounds. Extremities: no cyanosis, clubbing, rash, edema Neuro: alert & oriented x 3, cranial nerves grossly intact. moves all 4 extremities w/o difficulty. Affect pleasant.   ASSESSMENT & PLAN:  1. Chronic Systolic Heart Failure NICM, ECHO ~20% in 2013. EF 45-50% in 2016. Suspect due to ongoing AI +/- HTN - s/p AoV repair 10/18. F/u echo 12/18 with EF 45-50% moderate AI - ECHO 2019 EF 30-35% Mod AI.  - ECHO 02/2019 EF 45% (out of range for ICD) - Echo 3/21 EF 45% moderate AI. LV dimensions stable. RV moderately reduced  -PYP scan negative for amyloid.  - Now NYHA Class II-early III. BNP high at 3,053 (prior baseline 250-600 range), though no overt edema on imaging. - ReDs Clip  elevated at 41%. Bedside Echo w/ EF ~30-35% - Good response to IV Lasix 80 mg in ED w/ symptomatic improvement  - Ok for d/c home from ED w/ titration of home lasix regimen and close clinic f/u - Increase home Lasix to 80 mg daily  - Add 20 mEQ of KCl daily  - Continue Entresto 97-103 mg bid - Continue Hydralazine 25 mg tid. Not on nitrate due to intolerance (HAs) - Continue Coreg 25 mg bid - Discussed importance of low sodium diet  - Will will arrange post ED f/u in the AHF Clinic next week to reassess volume status - Will try to start [place] at next clinic visit    2. Aortic Insuffiencey S/p Repair - s/p repair by Dr. Roxy [name] in 2018 - Last echo 02/2020 showed mod AI  3. Dilated Ascending Aortic Dilation  -  Chest CT today shows dilatation of the ascending thoracic aorta with measured diameter of 4.9 x 4.7 cm. Measured diameter at the level of the sinuses of Valsalva is 4.8 cm. No dissection. - Will need referral back to CT surgery for annual surveillance.  - BP/HR well controlled  4. Stage III  CKD - Baseline SCr 1.6 - lasix recently increased, check BMP today  - is SCr stable, will start [place], [name]-C  9:16 AM

## 2020-11-05 ENCOUNTER — Other Ambulatory Visit (HOSPITAL_COMMUNITY): Payer: Self-pay

## 2020-11-05 MED ORDER — FUROSEMIDE 40 MG PO TABS: 40.0000 mg | ORAL_TABLET | Freq: Every day | ORAL | 1 refills | Status: DC

## 2020-11-14 ENCOUNTER — Encounter (HOSPITAL_COMMUNITY): Payer: Self-pay

## 2020-11-14 ENCOUNTER — Other Ambulatory Visit: Payer: Self-pay

## 2020-11-14 ENCOUNTER — Ambulatory Visit (HOSPITAL_COMMUNITY)
Admission: RE | Admit: 2020-11-14 | Discharge: 2020-11-14 | Disposition: A | Discharge status: Home / Self Care | Payer: BC Managed Care – PPO | Source: Ambulatory Visit | Attending: Adult Health | Admitting: Adult Health

## 2020-11-14 VITALS — BP 130/72 | HR 80 | Wt 225.8 lb

## 2020-11-14 DIAGNOSIS — Z79899 Other long term (current) drug therapy: Secondary | ICD-10-CM | POA: Insufficient documentation

## 2020-11-14 DIAGNOSIS — N183 Chronic kidney disease, stage 3 unspecified: Secondary | ICD-10-CM | POA: Insufficient documentation

## 2020-11-14 DIAGNOSIS — I5022 Chronic systolic (congestive) heart failure: Secondary | ICD-10-CM

## 2020-11-14 DIAGNOSIS — I130 Hypertensive heart and chronic kidney disease with heart failure and stage 1 through stage 4 chronic kidney disease, or unspecified chronic kidney disease: Secondary | ICD-10-CM | POA: Diagnosis not present

## 2020-11-14 DIAGNOSIS — I10 Essential (primary) hypertension: Secondary | ICD-10-CM

## 2020-11-14 DIAGNOSIS — I5042 Chronic combined systolic (congestive) and diastolic (congestive) heart failure: Secondary | ICD-10-CM

## 2020-11-14 DIAGNOSIS — I351 Nonrheumatic aortic (valve) insufficiency: Secondary | ICD-10-CM | POA: Diagnosis not present

## 2020-11-14 DIAGNOSIS — N1831 Chronic kidney disease, stage 3a: Secondary | ICD-10-CM

## 2020-11-14 DIAGNOSIS — I5020 Unspecified systolic (congestive) heart failure: Secondary | ICD-10-CM | POA: Diagnosis not present

## 2020-11-14 DIAGNOSIS — I428 Other cardiomyopathies: Secondary | ICD-10-CM

## 2020-11-14 DIAGNOSIS — R002 Palpitations: Secondary | ICD-10-CM | POA: Diagnosis not present

## 2020-11-14 LAB — BASIC METABOLIC PANEL
Anion gap: 7 (ref 5–15)
BUN: 20 mg/dL (ref 6–20)
CO2: 24 mmol/L (ref 22–32)
Calcium: 8.9 mg/dL (ref 8.9–10.3)
Chloride: 106 mmol/L (ref 98–111)
Creatinine, Ser: 1.72 mg/dL — ABNORMAL HIGH (ref 0.61–1.24)
GFR, Estimated: 51 mL/min — ABNORMAL LOW (ref >60)
Glucose, Bld: 89 mg/dL (ref 70–99)
Potassium: 4.6 mmol/L (ref 3.5–5.1)
Sodium: 137 mmol/L (ref 135–145)

## 2020-11-14 MED ORDER — FUROSEMIDE 80 MG PO TABS: 80.0000 mg | ORAL_TABLET | Freq: Every day | ORAL | 3 refills | Status: DC

## 2020-11-14 MED ORDER — POTASSIUM CHLORIDE CRYS ER 20 MEQ PO TBCR: 20.0000 meq | EXTENDED_RELEASE_TABLET | Freq: Every day | ORAL | 3 refills | Status: DC

## 2020-11-14 NOTE — Patient Instructions (Signed)
INCREASE Lasix to 80mg , one tab daily Potassium has been refilled  Labs today We will only contact you if something comes back abnormal or we need to make some changes. Otherwise no news is good news!  Your physician recommends that you schedule a follow-up appointment in: 6 weeks  in the Advanced Practitioners (PA/NP) Clinic   Your physician recommends that you schedule a follow-up appointment in: 3 months with Dr Haroldine Laws and echo  Your physician has requested that you have an echocardiogram. Echocardiography is a painless test that uses sound waves to create images of your heart. It provides your doctor with information about the size and shape of your heart and how well your heart's chambers and valves are working. This procedure takes approximately one hour. There are no restrictions for this procedure.  Do the following things EVERYDAY: 1) Weigh yourself in the morning before breakfast. Write it down and keep it in a log. 2) Take your medicines as prescribed 3) Eat low salt foods--Limit salt (sodium) to 2000 mg per day.  4) Stay as active as you can everyday 5) Limit all fluids for the day to less than 2 liters  At the [facility], you and your health needs are our priority. As part of our continuing mission to provide you with exceptional heart care, we have created designated Provider Care Teams. These Care Teams include your primary Cardiologist (physician) and Advanced Practice Providers (APPs- Physician Assistants and Nurse Practitioners) who all work together to provide you with the care you need, when you need it.   You may see any of the following providers on your designated Care Team at your next follow up: Marland Kitchen Dr Glori Bickers . Dr Loralie Champagne . Dr Vickki Muff . Darrick Grinder, NP . Lyda Jester, [place] . Audry Riles, PharmD   Please be sure to bring in all your medications bottles to every appointment.    If you have any  questions or concerns before your next appointment please send [place] a message through [place] or call our office at [phone].    TO LEAVE A MESSAGE FOR THE NURSE SELECT OPTION 2, PLEASE LEAVE A MESSAGE INCLUDING: . YOUR NAME . DATE OF BIRTH . CALL BACK NUMBER . REASON FOR CALL**this is important as we prioritize the call backs  YOU WILL RECEIVE A CALL BACK THE SAME DAY AS LONG AS YOU CALL BEFORE 4:00 PM

## 2020-11-14 NOTE — Progress Notes (Signed)
Advanced Heart Failure Clinic Note   Patient ID: LYALL FACIANE, male   DOB: 01/02/1981, 40 y.o.   MRN: [id number]  Primary PCP: [place]  Primary Cardiologist: Dr. Haroldine Laws  Reason for Visit: Heart Failure   HPI: Mr. Mcclinton is a 40 y.o.male with systolic HF due to  NICM, hypertension and CKD stage II-III (baseline Cr 1.5-1.7).   He was first diagnosed with HF in 2012 and his EF recovered and was instructed to stop taking carvedilol, spironolactone, and lasix by previous cardiologist. Admitted to [facility] 3/47/42 for acute systolic heart failure. 12/31/11 ProBNP 3777. HIV nonreactive. Thyroid panel normal. Renal ultrasound was negative for obstruction. EF 20%. Discharge weight 193 lbs. Cath with normal coronaries.  Admitted 05/30/2015 with HTN urgency with troponin elevation likely consistent with myocardial strain/demand ischemia in the setting of medication non compliance, having run out 2-3 weeks ago and not refilling them. Placed back on his coreg, hydralazine, and spiro and was symptomatically stable on discharge. No ACE/ARB with CKD and does not tolerate nitrates with headaches. Discharge weight 193 lbs.  Underwent TEE in 9/18 with severe AI. EF 40-45%. Cath with normal coronaries. S/P AV repair 05/11/2017 with Dr. Roxy Manns.  PYP scan negative 10/18  Echo 02/13/19. EF 45% w/ moderate AI. EF out of range for ICD. Echo 3/21 EF 45% moderate AI. LV dimensions stable.   Seen in clinic 09/14/19 and complained of intermittent palpitations. Echo stable  Zio patchNSR w/ 1 brief run of NSVT (4 beats), 4 brief runs of SVT and rare PVCs < 1% burden.   ED visit 10/02/20 with volume overload. Reds Clip 42%. Instructed to increase lasix to 80 mg daily however the medication script remained 40 mg daily. POCUS showed EF 35%.   Cancelled his HF follow up on 10/08/20.   Today he returns for HF follow up.Overall feeling fine. SOB with inclines. Denies PND/Orthopnea. Appetite ok. No fever or chills.  Weight at home 222-225 pounds. Taking all medications. He does not want take farxiga. Currently on leave from work due to hand injury. Lives with his wife.    ECHO 06/13/13: EF 20-25% ECHO 09/26/2014: EF 40%  ECHO 05/31/15 EF 20-25% Echo 11/17 EF 45-50% ECHO 03/2017 EF ~45% Severe AI  ECHO 06/23/2017: EF 45-50% . Grade II DD ECHO 2019 EF 30-35%  Moderate aortic regurgitation. RV moderately reduced.  Echo 02/2019, EF 45%, moderate AI Echo 09/2019   Review of systems complete and found to be negative unless listed in HPI.   Past Medical History:  Diagnosis Date  . Anxiety   . CHF (congestive heart failure) ([place])   . Chronic systolic heart failure ([place])   . CKD (chronic kidney disease) stage 2, GFR 60-89 ml/min   . Dyspnea    with value issues- "if I dont take my medication"  . Essential hypertension, benign   . Headache   . History of pneumonia   . Mitral regurgitation    Moderate  . Noncompliance   . Nonischemic cardiomyopathy ([place])    Normal coronaries May 2012, LVEF < 20%  . Pneumonia 2011  . S/P aortic valve repair 05/11/2017   Complex valvuloplasty including plication of left coronary leaflet and 14mm Biostable HAART ring annuloplasty    Current Outpatient Medications  Medication Sig Dispense Refill  . acetaminophen (TYLENOL) 500 MG tablet Take 1,000 mg by mouth every 6 (six) hours as needed for moderate pain or headache.     . ALPRAZolam (XANAX) 0.5 MG tablet Take 0.5 mg  by mouth at bedtime.    . carvedilol (COREG) 25 MG tablet TAKE 1 TABLET BY MOUTH TWICE DAILY WITH MEALS (Patient taking differently: Take 25 mg by mouth 2 (two) times daily with a meal.) 60 tablet 11  . diphenhydramine-acetaminophen (TYLENOL PM) 25-500 MG TABS tablet Take 1 tablet by mouth at bedtime as needed (sleep, pain).    Marland Kitchen ENTRESTO 97-103 MG Take 1 tablet by mouth twice daily 60 tablet 11  . furosemide (LASIX) 40 MG tablet Take 1 tablet (40 mg total) by mouth daily. 30 tablet 1  . hydrALAZINE  (APRESOLINE) 25 MG tablet TAKE 1 TABLET BY MOUTH THREE TIMES DAILY (Patient taking differently: Take 25 mg by mouth 3 (three) times daily.) 90 tablet 11  . potassium chloride SA (KLOR-CON) 20 MEQ tablet Take 1 tablet (20 mEq total) by mouth daily. (Patient not taking: Reported on 11/14/2020) 30 tablet 0   No current facility-administered medications for this encounter.    Vitals:   11/14/20 1510  BP: 130/72  Pulse: 80  SpO2: 95%  Weight: 102.4 kg (225 lb 12.8 oz)   Wt Readings from Last 3 Encounters:  11/14/20 102.4 kg (225 lb 12.8 oz)  06/19/20 104.3 kg (230 lb)  06/07/20 104.3 kg (230 lb)    ReDS Vest / Clip - 11/14/20 1500      ReDS Vest / Clip   Station Marker D    Ruler Value 32    ReDS Value Range High volume overload    ReDS Actual Value 45            PHYSICAL EXAM: General:  Well appearing. No resp difficulty. Walked in the clinic.  HEENT: normal Neck: supple. JVP 9-10 . Carotids 2+ bilat; no bruits. No lymphadenopathy or thryomegaly appreciated. Cor: PMI nondisplaced. Regular rate & rhythm. No rubs, gallops. 2/6 AS. Lungs: clear Abdomen: soft, nontender, nondistended. No hepatosplenomegaly. No bruits or masses. Good bowel sounds. Extremities: no cyanosis, clubbing, rash, edema Neuro: alert & orientedx3, cranial nerves grossly intact. moves all 4 extremities w/o difficulty. Affect pleasant   ASSESSMENT & PLAN: 1) Chronic systolic HF: NICM,  ECHO ~75% in 2013. EF 45-50% in 2016. Suspect due to ongoing AI +/- HTN - s/p AoV repair 10/18. F/u echo 12/18 with EF 45-50% moderate AI - ECHO 2019 EF 30-35% Mod AI.  - ECHO 02/2019 EF 45% (out of range for ICD) - Echo 09/2019 EF 45% moderate AI. LV dimensions stable   -PYP scan negative for amyloid.  Reds Clip 45%.  - Stable NYHA II-. Volume status elevated. Increase lasix to 80 mg daily. Refill potassium.  -Discussed adding farxiga. He does not want to add.   - Continue Coreg 25 mg BID - Continue Entresto 97/103 mg  BID - Continue 25 mg mg hydralazine tid (had HAs with Imdur in past).  higher.  2) HTN :  - Management as above  3) CKD stage III:  (baseline Cr 1.5-1.7)  - Check BMP today  - He does not want to try farxiga. Discussed at length.   - Keep SBP < 140  4) S/P AV repair 05/01/2017 - continues with moderate AI on ECHO 02/2020. LV dimensions stable - reinforced need for SBE prophylaxis  - - Watch AI and LV dimensions with yearly echos.   5) Palpitations   - Zio Patch ok   Check BEMT   Follow up 6 weeks to reassess volume status   Follow up in 12 weeks with Dr Haroldine Laws and an ECHO.  Darrick Grinder, NP  3:25 PM

## 2020-11-14 NOTE — Progress Notes (Signed)
ReDS Vest / Clip - 11/14/20 1500      ReDS Vest / Clip   Station Marker D    Ruler Value 32    ReDS Value Range High volume overload    ReDS Actual Value 45

## 2020-12-13 DIAGNOSIS — Z6833 Body mass index (BMI) 33.0-33.9, adult: Secondary | ICD-10-CM | POA: Diagnosis not present

## 2020-12-13 DIAGNOSIS — Z1331 Encounter for screening for depression: Secondary | ICD-10-CM | POA: Diagnosis not present

## 2020-12-13 DIAGNOSIS — H01001 Unspecified blepharitis right upper eyelid: Secondary | ICD-10-CM | POA: Diagnosis not present

## 2020-12-13 DIAGNOSIS — E6609 Other obesity due to excess calories: Secondary | ICD-10-CM | POA: Diagnosis not present

## 2020-12-13 DIAGNOSIS — G4700 Insomnia, unspecified: Secondary | ICD-10-CM | POA: Diagnosis not present

## 2020-12-25 ENCOUNTER — Encounter (HOSPITAL_COMMUNITY): Payer: BC Managed Care – PPO

## 2020-12-26 ENCOUNTER — Encounter (HOSPITAL_COMMUNITY): Payer: BC Managed Care – PPO

## 2021-01-21 NOTE — Progress Notes (Signed)
Advanced Heart Failure Clinic Note   Patient ID: RIGBY LEONHARDT, male   DOB: 09/08/1980, 40 y.o.   MRN: [id number]  Primary PCP: [place]  Primary Cardiologist: Dr. Haroldine Laws  Reason for Visit: Heart Failure   HPI: Mr. Sallade is a 40 y.o.male with systolic HF due to  NICM, hypertension and CKD stage II-III (baseline Cr 1.5-1.7).   He was first diagnosed with HF in 2012 and his EF recovered and was instructed to stop taking carvedilol, spironolactone, and lasix by previous cardiologist. Admitted to [facility] 12/26/05 for acute systolic heart failure. 12/31/11 ProBNP 3777. HIV nonreactive. Thyroid panel normal. Renal ultrasound was negative for obstruction. EF 20%. Discharge weight 193 lbs. Cath with normal coronaries.  Admitted 05/30/2015 with HTN urgency with troponin elevation likely consistent with myocardial strain/demand ischemia in the setting of medication non compliance, having run out 2-3 weeks ago and not refilling them. Placed back on his coreg, hydralazine, and spiro and was symptomatically stable on discharge. No ACE/ARB with CKD and does not tolerate nitrates with headaches. Discharge weight 193 lbs.  Underwent TEE in 9/18 with severe AI. EF 40-45%. Cath with normal coronaries. S/P AV repair 05/11/2017 with Dr. Roxy Manns.  PYP scan negative 10/18  Echo 02/13/19. EF 45% w/ moderate AI. EF out of range for ICD. Echo 3/21 EF 45% moderate AI. LV dimensions stable.   Seen in clinic 09/14/19 and complained of intermittent palpitations. Echo stable  Zio patchNSR w/ 1 brief run of NSVT (4 beats), 4 brief runs of SVT and rare PVCs < 1% burden.   ED visit 10/02/20 with volume overload. Reds Clip 42%. Instructed to increase lasix to 80 mg daily however the medication script remained 40 mg daily. POC US showed EF 35%.   Cancelled his HF follow up on 10/08/20.   He returned 5/22 for HF follow up. SOB with inclines. Weight at home 222-225 pounds. He does not want take [place]. Currently on  leave from work due to hand injury. Lives with his wife.    Today he returns for HF follow up. He and his wife welcomed twin boys & they have been in NICU at [facility] for past month. Overall feeling fine, tired from having twins. Denies increasing SOB, CP, dizziness, edema, or PND/Orthopnea. Appetite ok. No fever or chills. Weight at home 220-225 pounds. Taking all medications. 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  Cardiac Studies: - Echo (2/14): EF 20-25% - Echo (3/16): EF 40%  - Echo (11/16): EF 20-25% - Echo (11/17): EF 45-50% - Echo (9/18): EF ~45% Severe AI  - Echo (12/18): EF 45-50% . Grade II DD - Echo (2019): EF 30-35%  Moderate aortic regurgitation. RV moderately reduced.  - Echo (8/20): EF 45%, moderate AI - Echo (3/21): EF 45%, Grade II DD, moderate AI  - POC [place] (3/22): EF 35%   Review of systems complete and found to be negative unless listed in HPI.   Past Medical History:  Diagnosis Date   Anxiety    CHF (congestive heart failure) (HCC)    Chronic systolic heart failure (HCC)    CKD (chronic kidney disease) stage 2, GFR 60-89 ml/min    Dyspnea    with value issues- "if I dont take my medication"   Essential hypertension, benign    Headache    History of pneumonia    Mitral regurgitation    Moderate   Noncompliance    Nonischemic cardiomyopathy ([place])  Normal coronaries May 2012, LVEF < 20%   Pneumonia 2011   S/P aortic valve repair 05/11/2017   Complex valvuloplasty including plication of left coronary leaflet and 61mm Biostable HAART ring annuloplasty    Current Outpatient Medications  Medication Sig Dispense Refill   acetaminophen (TYLENOL) 500 MG tablet Take 1,000 mg by mouth every 6 (six) hours as needed for moderate pain or headache.      ALPRAZolam (XANAX) 0.5 MG tablet Take 0.5 mg by mouth at bedtime.     carvedilol (COREG) 25 MG tablet TAKE 1 TABLET BY MOUTH TWICE DAILY WITH MEALS 60 tablet 11    ENTRESTO 97-103 MG Take 1 tablet by mouth twice daily 60 tablet 11   furosemide (LASIX) 80 MG tablet Take 1 tablet (80 mg total) by mouth daily. 30 tablet 3   hydrALAZINE (APRESOLINE) 25 MG tablet TAKE 1 TABLET BY MOUTH THREE TIMES DAILY 90 tablet 11   potassium chloride SA (KLOR-CON) 20 MEQ tablet Take 1 tablet (20 mEq total) by mouth daily. 30 tablet 3   No current facility-administered medications for this encounter.  BP 112/77   Pulse 71   Wt 99.9 kg (220 lb 3.2 oz)   SpO2 96%   BMI 31.60 kg/m   Wt Readings from Last 3 Encounters:  01/22/21 99.9 kg (220 lb 3.2 oz)  11/14/20 102.4 kg (225 lb 12.8 oz)  06/19/20 104.3 kg (230 lb)   PHYSICAL EXAM: General:  NAD. No resp difficulty HEENT: Normal Neck: Supple. No JVD. Carotids 2+ bilat; no bruits. No lymphadenopathy or thryomegaly appreciated. Cor: PMI nondisplaced. Regular rate & rhythm. No rubs, gallops, II/VI AS Lungs: Clear Abdomen: Soft, nontender, nondistended. No hepatosplenomegaly. No bruits or masses. Good bowel sounds. Extremities: No cyanosis, clubbing, rash, edema Neuro: Alert & oriented x 3, cranial nerves grossly intact. Moves all 4 extremities w/o difficulty. Affect pleasant.  REDs: 43%  ECG: SR +LVH 68 bpm (personally reviewed).  ASSESSMENT & PLAN: 1) Chronic systolic HF: NICM,  ECHO ~23% in 2013. EF 45-50% in 2016. Suspect due to ongoing AI +/- HTN - s/p AoV repair 10/18. F/u echo 12/18 with EF 45-50% moderate AI - Echo (2019): EF 30-35% Mod AI.  - Echo (8/20): EF 45% (out of range for ICD) - Echo (3/21): EF 45% moderate AI. LV dimensions stable   -PYP scan negative for amyloid.  - Stable NYHA II. Volume good on exam & weight down, Reds elevated.  - Continue lasix to 80 mg daily.  - Long discussion about adding [place]. He does not want to add, would rather just use lasix. Discussed lasix does not strengthen heart. He will think about it. - Continue Coreg 25 mg bid. - Continue Entresto 97/103 mg bid. -  Continue hydralazine 25 mg tid (had HAs with Imdur in past).   - Stressed importance of daily weights, salt & fluid restriction and medication adherence. - BMET and BNP today.  2) HTN :  - Stable. - Continue current meds.  3) CKD stage III:  (baseline Cr 1.5-1.7)  - He does not want to try [place]. Discussed at length.   - Keep SBP < 140. - BMET today.  4) S/P AV repair 05/01/2017 - Continues with moderate AI on ECHO 02/2020. LV dimensions stable. - Reinforced need for SBE prophylaxis.  - Watch AI and LV dimensions with yearly echos.   5) Palpitations   - No further symptoms. - Zio Patch ok   Follow up in 6 weeks with Dr Haroldine Laws +  echo as scheduled.  [place], FNP  01/22/21

## 2021-01-22 ENCOUNTER — Other Ambulatory Visit: Payer: Self-pay

## 2021-01-22 ENCOUNTER — Ambulatory Visit (HOSPITAL_COMMUNITY)
Admission: RE | Admit: 2021-01-22 | Discharge: 2021-01-22 | Disposition: A | Discharge status: Home / Self Care | Payer: BC Managed Care – PPO | Source: Ambulatory Visit | Attending: Family Medicine | Admitting: Family Medicine

## 2021-01-22 ENCOUNTER — Encounter (HOSPITAL_COMMUNITY): Payer: Self-pay

## 2021-01-22 VITALS — BP 112/77 | HR 71 | Wt 220.2 lb

## 2021-01-22 DIAGNOSIS — R002 Palpitations: Secondary | ICD-10-CM | POA: Diagnosis not present

## 2021-01-22 DIAGNOSIS — I428 Other cardiomyopathies: Secondary | ICD-10-CM | POA: Diagnosis not present

## 2021-01-22 DIAGNOSIS — N1831 Chronic kidney disease, stage 3a: Secondary | ICD-10-CM | POA: Diagnosis not present

## 2021-01-22 DIAGNOSIS — I351 Nonrheumatic aortic (valve) insufficiency: Secondary | ICD-10-CM

## 2021-01-22 DIAGNOSIS — N183 Chronic kidney disease, stage 3 unspecified: Secondary | ICD-10-CM | POA: Diagnosis not present

## 2021-01-22 DIAGNOSIS — I5042 Chronic combined systolic (congestive) and diastolic (congestive) heart failure: Secondary | ICD-10-CM

## 2021-01-22 DIAGNOSIS — Z79899 Other long term (current) drug therapy: Secondary | ICD-10-CM | POA: Insufficient documentation

## 2021-01-22 DIAGNOSIS — I5022 Chronic systolic (congestive) heart failure: Secondary | ICD-10-CM | POA: Diagnosis not present

## 2021-01-22 DIAGNOSIS — I130 Hypertensive heart and chronic kidney disease with heart failure and stage 1 through stage 4 chronic kidney disease, or unspecified chronic kidney disease: Secondary | ICD-10-CM | POA: Insufficient documentation

## 2021-01-22 DIAGNOSIS — I10 Essential (primary) hypertension: Secondary | ICD-10-CM | POA: Diagnosis not present

## 2021-01-22 DIAGNOSIS — Z9889 Other specified postprocedural states: Secondary | ICD-10-CM

## 2021-01-22 LAB — BASIC METABOLIC PANEL
Anion gap: 6 (ref 5–15)
BUN: 19 mg/dL (ref 6–20)
CO2: 23 mmol/L (ref 22–32)
Calcium: 9 mg/dL (ref 8.9–10.3)
Chloride: 106 mmol/L (ref 98–111)
Creatinine, Ser: 1.66 mg/dL — ABNORMAL HIGH (ref 0.61–1.24)
GFR, Estimated: 53 mL/min — ABNORMAL LOW (ref >60)
Glucose, Bld: 117 mg/dL — ABNORMAL HIGH (ref 70–99)
Potassium: 4.5 mmol/L (ref 3.5–5.1)
Sodium: 135 mmol/L (ref 135–145)

## 2021-01-22 LAB — BRAIN NATRIURETIC PEPTIDE: B Natriuretic Peptide: 787.8 pg/mL — ABNORMAL HIGH (ref 0.0–100.0)

## 2021-01-22 NOTE — Patient Instructions (Signed)
It was great to see you today! No medication changes are needed at this time.   Labs today We will only contact you if something comes back abnormal or we need to make some changes. Otherwise no news is good news!   Keep followup as scheduled with Dr Haroldine Laws  Do the following things EVERYDAY: Weigh yourself in the morning before breakfast. Write it down and keep it in a log. Take your medicines as prescribed Eat low salt foods--Limit salt (sodium) to 2000 mg per day.  Stay as active as you can everyday Limit all fluids for the day to less than 2 liters  milAt the [facility], you and your health needs are our priority. As part of our continuing mission to provide you with exceptional heart care, we have created designated Provider Care Teams. These Care Teams include your primary Cardiologist (physician) and Advanced Practice Providers (APPs- Physician Assistants and Nurse Practitioners) who all work together to provide you with the care you need, when you need it.   You may see any of the following providers on your designated Care Team at your next follow up: Dr Glori Bickers Dr Loralie Champagne Dr Patrice Paradise, NP Lyda Jester, [place] Ginnie Smart Audry Riles, PharmD   Please be sure to bring in all your medications bottles to every appointment.    If you have any questions or concerns before your next appointment please send [place] a message through [place] or call our office at [phone].    TO LEAVE A MESSAGE FOR THE NURSE SELECT OPTION 2, PLEASE LEAVE A MESSAGE INCLUDING: YOUR NAME DATE OF BIRTH CALL BACK NUMBER REASON FOR CALL**this is important as we prioritize the call backs  YOU WILL RECEIVE A CALL BACK THE SAME DAY AS LONG AS YOU CALL BEFORE 4:00 PM

## 2021-01-22 NOTE — Progress Notes (Signed)
ReDS Vest / Clip - 01/22/21 0900       ReDS Vest / Clip   Station Marker D    Ruler Value 32    ReDS Value Range High volume overload    ReDS Actual Value 43

## 2021-02-18 ENCOUNTER — Encounter (HOSPITAL_COMMUNITY): Payer: BC Managed Care – PPO | Admitting: Internal Medicine

## 2021-02-18 ENCOUNTER — Ambulatory Visit (HOSPITAL_COMMUNITY): Admission: RE | Admit: 2021-02-18 | Payer: BC Managed Care – PPO | Source: Ambulatory Visit

## 2021-03-04 ENCOUNTER — Other Ambulatory Visit (HOSPITAL_COMMUNITY): Payer: Self-pay

## 2021-03-04 MED ORDER — FUROSEMIDE 80 MG PO TABS: 80.0000 mg | ORAL_TABLET | Freq: Every day | ORAL | 0 refills | Status: DC

## 2021-03-27 ENCOUNTER — Other Ambulatory Visit (HOSPITAL_COMMUNITY): Payer: Self-pay | Admitting: Adult Health

## 2021-04-29 ENCOUNTER — Ambulatory Visit (HOSPITAL_COMMUNITY)
Admission: RE | Admit: 2021-04-29 | Discharge: 2021-04-29 | Disposition: A | Discharge status: Home / Self Care | Payer: BC Managed Care – PPO | Source: Ambulatory Visit | Attending: Internal Medicine | Admitting: Internal Medicine

## 2021-04-29 ENCOUNTER — Other Ambulatory Visit: Payer: Self-pay

## 2021-04-29 ENCOUNTER — Ambulatory Visit (HOSPITAL_COMMUNITY)
Admission: RE | Admit: 2021-04-29 | Discharge: 2021-04-29 | Disposition: A | Discharge status: Home / Self Care | Payer: BC Managed Care – PPO | Source: Ambulatory Visit | Attending: Adult Health | Admitting: Adult Health

## 2021-04-29 ENCOUNTER — Encounter (HOSPITAL_COMMUNITY): Payer: Self-pay | Admitting: Internal Medicine

## 2021-04-29 VITALS — BP 112/62 | HR 76 | Wt 217.6 lb

## 2021-04-29 DIAGNOSIS — N183 Chronic kidney disease, stage 3 unspecified: Secondary | ICD-10-CM | POA: Diagnosis not present

## 2021-04-29 DIAGNOSIS — Z79899 Other long term (current) drug therapy: Secondary | ICD-10-CM | POA: Insufficient documentation

## 2021-04-29 DIAGNOSIS — R002 Palpitations: Secondary | ICD-10-CM | POA: Insufficient documentation

## 2021-04-29 DIAGNOSIS — I5022 Chronic systolic (congestive) heart failure: Secondary | ICD-10-CM | POA: Insufficient documentation

## 2021-04-29 DIAGNOSIS — I428 Other cardiomyopathies: Secondary | ICD-10-CM | POA: Diagnosis not present

## 2021-04-29 DIAGNOSIS — I5042 Chronic combined systolic (congestive) and diastolic (congestive) heart failure: Secondary | ICD-10-CM | POA: Diagnosis not present

## 2021-04-29 DIAGNOSIS — I130 Hypertensive heart and chronic kidney disease with heart failure and stage 1 through stage 4 chronic kidney disease, or unspecified chronic kidney disease: Secondary | ICD-10-CM | POA: Diagnosis not present

## 2021-04-29 DIAGNOSIS — I080 Rheumatic disorders of both mitral and aortic valves: Secondary | ICD-10-CM | POA: Insufficient documentation

## 2021-04-29 LAB — BASIC METABOLIC PANEL
Anion gap: 9 (ref 5–15)
BUN: 21 mg/dL — ABNORMAL HIGH (ref 6–20)
CO2: 24 mmol/L (ref 22–32)
Calcium: 9.2 mg/dL (ref 8.9–10.3)
Chloride: 105 mmol/L (ref 98–111)
Creatinine, Ser: 1.69 mg/dL — ABNORMAL HIGH (ref 0.61–1.24)
GFR, Estimated: 52 mL/min — ABNORMAL LOW (ref >60)
Glucose, Bld: 87 mg/dL (ref 70–99)
Potassium: 3.9 mmol/L (ref 3.5–5.1)
Sodium: 138 mmol/L (ref 135–145)

## 2021-04-29 LAB — ECHOCARDIOGRAM COMPLETE
AR max vel: 1.56 cm2
AV Area VTI: 1.46 cm2
AV Area mean vel: 1.41 cm2
AV Mean grad: 22 mmHg
AV Peak grad: 36.8 mmHg
Ao pk vel: 3.03 m/s
Calc EF: 36.5 %
MV M vel: 4.32 m/s
MV Peak grad: 74.6 mmHg
P 1/2 time: 316 msec
Radius: 0.3 cm
S' Lateral: 5.2 cm
Single Plane A2C EF: 28.5 %
Single Plane A4C EF: 41.9 %

## 2021-04-29 LAB — BRAIN NATRIURETIC PEPTIDE: B Natriuretic Peptide: 2433.6 pg/mL — ABNORMAL HIGH (ref 0.0–100.0)

## 2021-04-29 NOTE — Progress Notes (Signed)
[facility] Note   Patient ID: [name] [name], male   DOB: [birth year], 40 y.o.   MRN: [id number]  Primary PCP: [facility]  Primary Cardiologist: Dr. Haroldine Laws  Reason for Visit: Heart Failure   HPI: Mr. Koury is a 40 y.o.male with systolic HF due to  NICM, hypertension and CKD stage II-III (baseline Cr 1.5-1.7).   He was first diagnosed with HF in 2012 and his EF recovered and was instructed to stop taking carvedilol, spironolactone, and lasix by previous cardiologist. Admitted to [facility] 0/53/97 for acute systolic heart failure. 12/31/11 ProBNP 3777. HIV nonreactive. Thyroid panel normal. Renal ultrasound was negative for obstruction. EF 20%. Discharge weight 193 lbs. Cath with normal coronaries.  Admitted 05/30/2015 with HTN urgency with troponin elevation likely consistent with myocardial strain/demand ischemia in the setting of medication non compliance, having run out 2-3 weeks ago and not refilling them. Placed back on his coreg, hydralazine, and spiro and was symptomatically stable on discharge. No ACE/ARB with CKD and does not tolerate nitrates with headaches. Discharge weight 193 lbs.  Underwent TEE in 9/18 with severe AI. EF 40-45%. Cath with normal coronaries. S/P AV repair 05/11/2017 with Dr. Roxy Manns.  PYP scan negative 10/18  Echo 02/13/19. EF 45% w/ moderate AI. EF out of range for ICD. Echo 3/21 EF 45% moderate AI. LV dimensions stable.   Seen in clinic 09/14/19 and complained of intermittent palpitations. Echo stable  Zio patchNSR w/ 1 brief run of NSVT (4 beats), 4 brief runs of SVT and rare PVCs < 1% burden.   ED visit 10/02/20 with volume overload. Reds Clip 42%. Instructed to increase lasix to 80 mg daily however the medication script remained 40 mg daily. POC US showed EF 35%.   Today he returns for HF follow up. He and his wife had twin boys in 6/15 (they were in NICU for 2 months). Now doing well. He feels good. Denies SOB, orthopnea or PND. Taking  all meds  Echo today EF 30-35% LV dilated. Mild-mod AI Mod-severe central MR Personally reviewed   Cardiac Studies: - Echo (2/14): EF 20-25% - Echo (3/16): EF 40%  - Echo (11/16): EF 20-25% - Echo (11/17): EF 45-50% - Echo (9/18): EF ~45% Severe AI  - Echo (12/18): EF 45-50% . Grade II DD - Echo (2019): EF 30-35%  Moderate aortic regurgitation. RV moderately reduced.  - Echo (8/20): EF 45%, moderate AI - Echo (3/21): EF 45%, Grade II DD, moderate AI  - POC [place] (3/22): EF 35%   Review of systems complete and found to be negative unless listed in HPI.   Past Medical History:  Diagnosis Date   Anxiety    CHF (congestive heart failure) (HCC)    Chronic systolic heart failure (HCC)    CKD (chronic kidney disease) stage 2, GFR 60-89 ml/min    Dyspnea    with value issues- "if I dont take my medication"   Essential hypertension, benign    Headache    History of pneumonia    Mitral regurgitation    Moderate   Noncompliance    Nonischemic cardiomyopathy ([place])    Normal coronaries May 2012, LVEF < 20%   Pneumonia 2011   S/P aortic valve repair 05/11/2017   Complex valvuloplasty including plication of left coronary leaflet and 62mm Biostable HAART ring annuloplasty    Current Outpatient Medications  Medication Sig Dispense Refill   acetaminophen (TYLENOL) 500 MG tablet Take 1,000 mg by mouth every 6 (six) hours  as needed for moderate pain or headache.      ALPRAZolam (XANAX) 0.5 MG tablet Take 0.5 mg by mouth at bedtime.     carvedilol (COREG) 25 MG tablet TAKE 1 TABLET BY MOUTH TWICE DAILY WITH MEALS 60 tablet 11   ENTRESTO 97-103 MG Take 1 tablet by mouth twice daily 60 tablet 11   furosemide (LASIX) 80 MG tablet Take 1 tablet (80 mg total) by mouth daily. 90 tablet 0   hydrALAZINE (APRESOLINE) 25 MG tablet TAKE 1 TABLET BY MOUTH THREE TIMES DAILY 90 tablet 11   potassium chloride SA (KLOR-CON) 20 MEQ tablet Take 1 tablet by mouth once daily 30 tablet 0   No current  facility-administered medications for this encounter.  BP 112/62   Pulse 76   Wt 98.7 kg (217 lb 9.6 oz)   SpO2 98%   BMI 31.22 kg/m   Wt Readings from Last 3 Encounters:  04/29/21 98.7 kg (217 lb 9.6 oz)  01/22/21 99.9 kg (220 lb 3.2 oz)  11/14/20 102.4 kg (225 lb 12.8 oz)   PHYSICAL EXAM: General:  Well appearing. No resp difficulty HEENT: normal Neck: supple. no JVD. Carotids 2+ bilat; no bruits. No lymphadenopathy or thryomegaly appreciated. Cor: PMI nondisplaced. Regular rate & rhythm. 2/6 AS soft AI 2/6 Mr s2 ok  Lungs: clear Abdomen: obese soft, nontender, nondistended. No hepatosplenomegaly. No bruits or masses. Good bowel sounds. Extremities: no cyanosis, clubbing, rash, edema Neuro: alert & orientedx3, cranial nerves grossly intact. moves all 4 extremities w/o difficulty. Affect pleasant  ASSESSMENT & PLAN:  1) Chronic systolic HF: NICM,  ECHO ~28% in 2013. EF 45-50% in 2016. Suspect due to ongoing AI +/- HTN - s/p AoV repair 10/18. F/u echo 12/18 with EF 45-50% moderate AI - Echo (2019): EF 30-35% Mod AI.  - Echo (8/20): EF 45% (out of range for ICD) - Echo (3/21): EF 45% moderate AI. LV dimensions stable (LVIDd 6.5cm) - Echo today 04/29/21 EF 30-35% LV dilated. Mild-mod AI Mod-severe central MR (LVIDd 6.0cm) Personally reviewed  -PYP scan negative for amyloid.  - Overall stable NYHA II. Volume status ok on exam however echo worse today. - Continue lasix to 80 mg daily.  - Again I stressed to him the need to consider adding [place]. He will d/w his wife and get back to [place].  - Continue Coreg 25 mg bid. - Continue Entresto 97/103 mg bid. - Continue hydralazine 25 mg tid (had HAs with Imdur in past).   - Has not been on spiro due to hyperkalemia - Labs today  2) HTN :  - Blood pressure well controlled.   3) CKD stage III:  (baseline Cr 1.5-1.7)  - Encouraged SGLT2i see above - Keep SBP < 140. - Labs today  4) S/P AV repair 05/01/2017 - Continues with mild to  moderate AI - LV moderately dilated. May need to consider TEE if EF not improving  5) Palpitations   - No further symptoms. - Zio Patch ok    Glori Bickers, MD  04/29/21

## 2021-04-29 NOTE — Progress Notes (Signed)
  Echocardiogram 2D Echocardiogram has been performed.  Fidel Levy 04/29/2021, 2:58 PM

## 2021-04-29 NOTE — Patient Instructions (Signed)
It was great to see you today! No medication changes are needed at this time. However please be sure to speak with your wife regarding Vania Rea and [employer] today We will only contact you if something comes back abnormal or we need to make some changes. Otherwise no news is good news!  Your physician recommends that you schedule a follow-up appointment in: 3 months  in the Advanced Practition[facility] (PA/NP) Clinic    Do the following things EVERYDAY: Weigh yourself in the morning before breakfast. Write it down and keep it in a log. Take your medicines as prescribed Eat low salt foods--Limit salt (sodium) to 2000 mg per day.  Stay as active as you can everyday Limit all fluids for the day to less than 2 liters  At the [facility], you and your health needs are our priority. As part of our continuing mission to provide you with exceptional heart care, we have created designated Provider Care Teams. These Care Teams include your primary Cardiologist (physician) and Advanced Practice Providers (APPs- Physician Assistants and Nurse Practitioners) who all work together to provide you with the care you need, when you need it.   You may see any of the following providers on your designated Care Team at your next follow up: Dr Glori Bickers Dr Loralie Champagne Dr Patrice Paradise, NP Lyda Jester, [place] Ginnie Smart Audry Riles, PharmD   Please be sure to bring in all your medications bottles to every appointment.   If you have any questions or concerns before your next appointment please send [place] a message through [place] or call our office at [phone].    TO LEAVE A MESSAGE FOR THE NURSE SELECT OPTION 2, PLEASE LEAVE A MESSAGE INCLUDING: YOUR NAME DATE OF BIRTH CALL BACK NUMBER REASON FOR CALL**this is important as we prioritize the call backs  YOU WILL RECEIVE A CALL BACK THE SAME DAY AS LONG AS YOU CALL BEFORE 4:00 PM

## 2021-06-04 ENCOUNTER — Encounter (HOSPITAL_COMMUNITY): Payer: Self-pay | Admitting: *Deleted

## 2021-06-04 NOTE — Progress Notes (Signed)
Pt's form for intermittent FMLA completed, signed by Dr Haroldine Laws, and faxed into [employer] at [phone]

## 2021-06-10 ENCOUNTER — Encounter: Payer: Self-pay | Admitting: Orthopaedic Surgery

## 2021-06-10 ENCOUNTER — Ambulatory Visit (INDEPENDENT_AMBULATORY_CARE_PROVIDER_SITE_OTHER): Payer: BC Managed Care – PPO | Admitting: Orthopaedic Surgery

## 2021-06-10 ENCOUNTER — Ambulatory Visit: Payer: BC Managed Care – PPO

## 2021-06-10 ENCOUNTER — Other Ambulatory Visit: Payer: Self-pay

## 2021-06-10 VITALS — BP 110/75 | HR 103 | Ht 70.0 in | Wt 218.0 lb

## 2021-06-10 DIAGNOSIS — M79672 Pain in left foot: Secondary | ICD-10-CM

## 2021-06-10 MED ORDER — PREDNISONE 5 MG (21) PO TBPK: ORAL_TABLET | ORAL | 0 refills | Status: DC

## 2021-06-10 NOTE — Progress Notes (Addendum)
My left foot hurts.  He has developed pain in the dorsum of the left foot over the last week to ten days.  It is getting worse.  He has no trauma.  He has no redness or swelling.  It hurts to bear weight. He has to wear steel tipped shoes at work and had a very hard time last night.  He is limping badly to the left.  He has tried Tylenol and Advil, ice and heat, elevation with only slight help.  He has no change in shoe wear.  Left foot is tender over the dorsum, mid portion at the junction of the mid foot to forefoot.  He has no swelling.  Pain is in the center of the foot, no redness. ROM of ankle and toes are full.  There are no lesions.  NV intact.  X-rays were done of the left foot, reported separately.  Negative.  Encounter Diagnosis  Name Primary?   Acute foot pain, left Yes   I will give CAM walker.  I told him to use Aspercreme or Biofreeze.    I will give note to be out of work.  I will give prednisone dose pack.  I want him checked for gout, serum uric acid.  Return in one week.  Call if any problem.  Precautions discussed.  Electronically Signed Sanjuana Kava, MD 11/29/20222:57 PM  Addendum: Serum uric acid returned very high, 12.1.  I will begin allopurinol and colchicine.  Begin now.  Electronically Signed Sanjuana Kava, MD 12/1/202212:30 PM

## 2021-06-10 NOTE — Patient Instructions (Signed)
Out of work this week.

## 2021-06-11 LAB — URIC ACID: Uric Acid, Serum: 12.1 mg/dL — ABNORMAL HIGH (ref 4.0–8.0)

## 2021-06-12 ENCOUNTER — Other Ambulatory Visit (HOSPITAL_COMMUNITY): Payer: Self-pay | Admitting: Internal Medicine

## 2021-06-12 MED ORDER — ALLOPURINOL 300 MG PO TABS: 300.0000 mg | ORAL_TABLET | Freq: Every day | ORAL | 5 refills | Status: DC

## 2021-06-12 MED ORDER — COLCHICINE 0.6 MG PO TABS: ORAL_TABLET | ORAL | 3 refills | Status: DC

## 2021-06-12 NOTE — Addendum Note (Signed)
Addended by: Willette Pa on: 06/12/2021 12:31 PM   Modules accepted: Orders

## 2021-06-17 ENCOUNTER — Other Ambulatory Visit: Payer: Self-pay

## 2021-06-17 ENCOUNTER — Encounter: Payer: Self-pay | Admitting: Orthopaedic Surgery

## 2021-06-17 ENCOUNTER — Ambulatory Visit (INDEPENDENT_AMBULATORY_CARE_PROVIDER_SITE_OTHER): Payer: BC Managed Care – PPO | Admitting: Orthopaedic Surgery

## 2021-06-17 DIAGNOSIS — M10072 Idiopathic gout, left ankle and foot: Secondary | ICD-10-CM

## 2021-06-17 NOTE — Progress Notes (Signed)
I feel better.  His uric acid was 12.1.  I called in allopurinol and colchicine but he has not gotten it yet.  I have explained what gout is and the treatment plans.  His left foot is not tender today. He is walking well.  NV intact. Encounter Diagnosis  Name Primary?   Acute idiopathic gout of left foot Yes   Get the medicine.  Return prn.  Call if any problem.  Precautions discussed.  Electronically Signed Sanjuana Kava, MD 12/6/20222:16 PM

## 2021-06-17 NOTE — Patient Instructions (Addendum)
Gout Let your primary care doctor know your uric acid was 12.1 and they will repeat the level at some point,you may also need other labs done as well  Gout is a condition that causes painful swelling of the joints. Gout is a type of inflammation of the joints (arthritis). This condition is caused by having too much uric acid in the body. Uric acid is a chemical that forms when the body breaks down substances called purines. Purines are important for building body proteins. When the body has too much uric acid, sharp crystals can form and build up inside the joints. This causes pain and swelling. Gout attacks can happen quickly and may be very painful (acute gout). Over time, the attacks can affect more joints and become more frequent (chronic gout). Gout can also cause uric acid to build up under the skin and inside the kidneys. What are the causes? This condition is caused by too much uric acid in your blood. This can happen because: Your kidneys do not remove enough uric acid from your blood. This is the most common cause. Your body makes too much uric acid. This can happen with some cancers and cancer treatments. It can also occur if your body is breaking down too many red blood cells (hemolytic anemia). You eat too many foods that are high in purines. These foods include organ meats and some seafood. Alcohol, especially beer, is also high in purines. A gout attack may be triggered by trauma or stress. What increases the risk? You are more likely to develop this condition if you: Have a family history of gout. Are male and middle-aged. Are male and have gone through menopause. Are obese. Frequently drink alcohol, especially beer. Are dehydrated. Lose weight too quickly. Have an organ transplant. Have lead poisoning. Take certain medicines, including aspirin, cyclosporine, diuretics, levodopa, and niacin. Have kidney disease. Have a skin condition called psoriasis. What are the signs or  symptoms? An attack of acute gout happens quickly. It usually occurs in just one joint. The most common place is the big toe. Attacks often start at night. Other joints that may be affected include joints of the feet, ankle, knee, fingers, wrist, or elbow. Symptoms of this condition may include: Severe pain. Warmth. Swelling. Stiffness. Tenderness. The affected joint may be very painful to touch. Shiny, red, or purple skin. Chills and fever. Chronic gout may cause symptoms more frequently. More joints may be involved. You may also have white or yellow lumps (tophi) on your hands or feet or in other areas near your joints. How is this diagnosed? This condition is diagnosed based on your symptoms, medical history, and physical exam. You may have tests, such as: Blood tests to measure uric acid levels. Removal of joint fluid with a thin needle (aspiration) to look for uric acid crystals. X-rays to look for joint damage. How is this treated? Treatment for this condition has two phases: treating an acute attack and preventing future attacks. Acute gout treatment may include medicines to reduce pain and swelling, including: NSAIDs. Steroids. These are strong anti-inflammatory medicines that can be taken by mouth (orally) or injected into a joint. Colchicine. This medicine relieves pain and swelling when it is taken soon after an attack. It can be given by mouth or through an IV. Preventive treatment may include: Daily use of smaller doses of NSAIDs or colchicine. Use of a medicine that reduces uric acid levels in your blood. Changes to your diet. You may need to see  a dietitian about what to eat and drink to prevent gout. Follow these instructions at home: During a gout attack  If directed, put ice on the affected area: Put ice in a plastic bag. Place a towel between your skin and the bag. Leave the ice on for 20 minutes, 2-3 times a day. Raise (elevate) the affected joint above the level of  your heart as often as possible. Rest the joint as much as possible. If the affected joint is in your leg, you may be given crutches to use. Follow instructions from your health care provider about eating or drinking restrictions. Avoiding future gout attacks Follow a low-purine diet as told by your dietitian or health care provider. Avoid foods and drinks that are high in purines, including liver, kidney, anchovies, asparagus, herring, mushrooms, mussels, and beer. Maintain a healthy weight or lose weight if you are overweight. If you want to lose weight, talk with your health care provider. It is important that you do not lose weight too quickly. Start or maintain an exercise program as told by your health care provider. Eating and drinking Drink enough fluids to keep your urine pale yellow. If you drink alcohol: Limit how much you use to: 0-1 drink a day for women. 0-2 drinks a day for men. Be aware of how much alcohol is in your drink. In the [place] one drink equals one 12 oz bottle of beer (355 mL) one 5 oz glass of wine (148 mL), or one 1 oz glass of hard liquor (44 mL). General instructions Take over-the-counter and prescription medicines only as told by your health care provider. Do not drive or use heavy machinery while taking prescription pain medicine. Return to your normal activities as told by your health care provider. Ask your health care provider what activities are safe for you. Keep all follow-up visits as told by your health care provider. This is important. Contact a health care provider if you have: Another gout attack. Continuing symptoms of a gout attack after 10 days of treatment. Side effects from your medicines. Chills or a fever. Burning pain when you urinate. Pain in your lower back or belly. Get help right away if you: Have severe or uncontrolled pain. Cannot urinate. Summary Gout is painful swelling of the joints caused by inflammation. The most common  site of pain is the big toe, but it can affect other joints in the body. Medicines and dietary changes can help to prevent and treat gout attacks. This information is not intended to replace advice given to you by your health care provider. Make sure you discuss any questions you have with your health care provider. Document Revised: 01/07/2018 Document Reviewed: 01/19/2018 Elsevier Patient Education  [place] of work until Friday.

## 2021-06-21 ENCOUNTER — Other Ambulatory Visit (HOSPITAL_COMMUNITY): Payer: Self-pay | Admitting: Adult Health

## 2021-08-01 ENCOUNTER — Telehealth (HOSPITAL_COMMUNITY): Payer: Self-pay

## 2021-08-01 NOTE — Telephone Encounter (Signed)
Called and was unable to leave patient a voice message to confirm/remind patient of their appointment at the [facility] on 08/04/21.

## 2021-08-01 NOTE — Progress Notes (Addendum)
Advanced Heart Failure Clinic Note   Patient ID: [name] [name], male   DOB: 21-Sep-1980, 41 y.o.   MRN: [id number]  Primary PCP: [name]  HF Cardiologist: Dr. Haroldine [name]  Reason for Visit: Heart Failure   HPI: [name] [name] is a 41 y.o.with systolic HF due to  NICM, hypertension and CKD stage II-III (baseline Cr 1.5-1.7).   He was first diagnosed with HF in 2012 and his EF recovered and was instructed to stop taking carvedilol, spironolactone, and lasix by previous cardiologist. Admitted to [facility] 01/03/75 for acute systolic heart failure. 12/31/11 ProBNP 3777. HIV nonreactive. Thyroid panel normal. Renal ultrasound was negative for obstruction. EF 20%. Discharge weight 193 lbs. Cath with normal coronaries.  Admitted 05/30/2015 with HTN urgency with troponin elevation likely consistent with myocardial strain/demand ischemia in [name] setting of medication non compliance, having run out 2-3 weeks ago and not refilling them. Placed back on his coreg, hydralazine, and spiro and was symptomatically stable on discharge. No ACE/ARB with CKD and does not tolerate nitrates with headaches. Discharge weight 193 lbs.  Underwent TEE in 9/18 with severe AI. EF 40-45%. Cath with normal coronaries. S/P AV repair 05/11/2017 with Dr. Roxy [name].  PYP scan negative 10/18  Echo 02/13/19. EF 45% w/ moderate AI. EF out of range for ICD. Echo 3/21 EF 45% moderate AI. LV dimensions stable.   Seen in clinic 09/14/19 and complained of intermittent palpitations. Echo stable  Zio patch NSR w/ 1 brief run of NSVT (4 beats), 4 brief runs of SVT and rare PVCs < 1% burden.   ED visit 10/02/20 with volume overload. Reds Clip 42%. Instructed to increase lasix to 80 mg daily however [name] medication script remained 40 mg daily. POC US showed EF 35%.   Echo 10/22 EF 30-35% LV dilated. Mild-mod AI Mod-severe central MR.   Today he returns for HF follow up. Overall feeling fine. Main complaint is a productive cough x 1 month. No  recent sick contacts. He has some SOB walking further distances while carrying his sons, but otherwise no issues. Denies palpitations, CP, dizziness, edema, or PND/Orthopnea. Appetite ok. No fever or chills. Weight at home stable. Taking all medications. 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 Twin boys are now 1 months old.  Cardiac Studies: - Echo (2/14): EF 20-25% - Echo (3/16): EF 40%  - Echo (11/16): EF 20-25% - Echo (11/17): EF 45-50% - Echo (9/18): EF ~45% Severe AI  - Echo (12/18): EF 45-50% . Grade II DD - Echo (2019): EF 30-35%  Moderate aortic regurgitation. RV moderately reduced.  - Echo (8/20): EF 45%, moderate AI - Echo (3/21): EF 45%, Grade II DD, moderate AI  - POC [place] (3/22): EF 35% - Echo (10/22): EF 30-35%, LV dilated, mild-mod AI, moderate-severe central MR.   Review of systems complete and found to be negative unless listed in HPI.   Past Medical History:  Diagnosis Date   Anxiety    CHF (congestive heart failure) (HCC)    Chronic systolic heart failure (HCC)    CKD (chronic kidney disease) stage 2, GFR 60-89 ml/min    Dyspnea    with value issues- "if I dont take my medication"   Essential hypertension, benign    Headache    History of pneumonia    Mitral regurgitation    Moderate   Noncompliance    Nonischemic cardiomyopathy ([name] [name])    Normal coronaries May 2012, LVEF < 20%   Pneumonia 2011   S/P aortic valve  repair 05/11/2017   Complex valvuloplasty including plication of left coronary leaflet and 46mm Biostable HAART ring annuloplasty    Current Outpatient Medications  Medication Sig Dispense Refill   acetaminophen (TYLENOL) 500 MG tablet Take 1,000 mg by mouth every 6 (six) hours as needed for moderate pain or headache.      allopurinol (ZYLOPRIM) 300 MG tablet Take 1 tablet (300 mg total) by mouth daily. 30 tablet 5   ALPRAZolam (XANAX) 0.5 MG tablet Take 0.5 mg by mouth at bedtime.     carvedilol (COREG) 25 MG tablet TAKE 1 TABLET BY MOUTH  TWICE DAILY WITH MEALS 60 tablet 11   colchicine 0.6 MG tablet One by mouth three times a day for five days for gout pain. 15 tablet 3   ENTRESTO 97-103 MG Take 1 tablet by mouth twice daily 60 tablet 11   furosemide (LASIX) 80 MG tablet Take 1 tablet by mouth once daily 90 tablet 0   hydrALAZINE (APRESOLINE) 25 MG tablet TAKE 1 TABLET BY MOUTH THREE TIMES DAILY 90 tablet 11   potassium chloride SA (KLOR-CON M) 20 MEQ tablet Take 1 tablet by mouth once daily 30 tablet 11   No current facility-administered medications for this encounter.  BP 100/68    Pulse 74    Wt 95.8 kg (211 lb 3.2 oz)    SpO2 97%    BMI 30.30 kg/m   Wt Readings from Last 3 Encounters:  08/04/21 95.8 kg (211 lb 3.2 oz)  06/10/21 98.9 kg (218 lb)  04/29/21 98.7 kg (217 lb 9.6 oz)   PHYSICAL EXAM: General:  NAD. No resp difficulty HEENT: Normal Neck: Supple. No JVD. Carotids 2+ bilat; no bruits. No lymphadenopathy or thryomegaly appreciated. Cor: PMI nondisplaced. Regular rate & rhythm. No rubs, gallops, 2/6 AS and 2/6 MR, audible S2 Lungs: Clear Abdomen: Soft, nontender, nondistended. No hepatosplenomegaly. No bruits or masses. Good bowel sounds. Extremities: No cyanosis, clubbing, rash, edema Neuro: Alert & oriented x 3, cranial nerves grossly intact. Moves all 4 extremities w/o difficulty. Affect pleasant.  ASSESSMENT & PLAN:  1) Chronic systolic HF: NICM,   - Echo ~20% in 2013. EF 45-50% in 2016. Suspect due to ongoing AI +/- HTN - s/p AoV repair 10/18. F/u echo 12/18 with EF 45-50% moderate AI - Echo (2019): EF 30-35% Mod AI.  - Echo (8/20): EF 45% (out of range for ICD) - Echo (3/21): EF 45% moderate AI. LV dimensions stable (LVIDd 6.5cm) - Echo (10/22): EF 30-35% LV dilated. Mild-mod AI Mod-severe central MR (LVIDd 6.0cm)  - Overall stable NYHA II. Volume status ok on exam, weight down 6 lbs. - Continue Lasix 80 mg daily.  - Again stressed consideration of Farxiga. He says he does not want to start a new  med that he will have to take for life and concerned that it initially was a med for diabetics. He will d/w his wife and get back to [place].  - Continue Coreg 25 mg bid. - Continue Entresto 97/103 mg bid. - Continue hydralazine 25 mg tid (had HAs with Imdur in past).   - Has not been on spiro due to hyperkalemia. - BMET/BNP today. Check A1C, this may reassure him with starting [place].  2) HTN :  - Blood pressure well controlled.  - Continue current meds.  3) CKD stage III:  (baseline Cr 1.5-1.7)  - Encouraged SGLT2i see above. - Keep SBP < 140. - Labs today.  4) S/P AV repair 05/01/2017 - Continues with  mild to moderate AI. - LV moderately dilated. May need to consider TEE if EF not improving.  5) Palpitations   - No further symptoms. - Zio Patch ok.   Follow up in 3 months with Dr. Haroldine [name].   [place], FNP  08/04/21

## 2021-08-04 ENCOUNTER — Ambulatory Visit (HOSPITAL_COMMUNITY)
Admission: RE | Admit: 2021-08-04 | Discharge: 2021-08-04 | Disposition: A | Discharge status: Home / Self Care | Payer: BC Managed Care – PPO | Source: Ambulatory Visit | Attending: Family Medicine | Admitting: Family Medicine

## 2021-08-04 ENCOUNTER — Encounter (HOSPITAL_COMMUNITY): Payer: Self-pay

## 2021-08-04 ENCOUNTER — Other Ambulatory Visit: Payer: Self-pay

## 2021-08-04 VITALS — BP 100/68 | HR 74 | Wt 211.2 lb

## 2021-08-04 DIAGNOSIS — I130 Hypertensive heart and chronic kidney disease with heart failure and stage 1 through stage 4 chronic kidney disease, or unspecified chronic kidney disease: Secondary | ICD-10-CM | POA: Insufficient documentation

## 2021-08-04 DIAGNOSIS — N183 Chronic kidney disease, stage 3 unspecified: Secondary | ICD-10-CM | POA: Diagnosis not present

## 2021-08-04 DIAGNOSIS — Z9889 Other specified postprocedural states: Secondary | ICD-10-CM | POA: Diagnosis not present

## 2021-08-04 DIAGNOSIS — N1831 Chronic kidney disease, stage 3a: Secondary | ICD-10-CM

## 2021-08-04 DIAGNOSIS — Z79899 Other long term (current) drug therapy: Secondary | ICD-10-CM | POA: Diagnosis not present

## 2021-08-04 DIAGNOSIS — I428 Other cardiomyopathies: Secondary | ICD-10-CM | POA: Insufficient documentation

## 2021-08-04 DIAGNOSIS — R002 Palpitations: Secondary | ICD-10-CM

## 2021-08-04 DIAGNOSIS — I10 Essential (primary) hypertension: Secondary | ICD-10-CM | POA: Diagnosis not present

## 2021-08-04 DIAGNOSIS — I5022 Chronic systolic (congestive) heart failure: Secondary | ICD-10-CM | POA: Diagnosis not present

## 2021-08-04 DIAGNOSIS — I351 Nonrheumatic aortic (valve) insufficiency: Secondary | ICD-10-CM | POA: Diagnosis not present

## 2021-08-04 LAB — BRAIN NATRIURETIC PEPTIDE: B Natriuretic Peptide: 2664.4 pg/mL — ABNORMAL HIGH (ref 0.0–100.0)

## 2021-08-04 LAB — BASIC METABOLIC PANEL
Anion gap: 8 (ref 5–15)
BUN: 22 mg/dL — ABNORMAL HIGH (ref 6–20)
CO2: 22 mmol/L (ref 22–32)
Calcium: 8.7 mg/dL — ABNORMAL LOW (ref 8.9–10.3)
Chloride: 107 mmol/L (ref 98–111)
Creatinine, Ser: 1.8 mg/dL — ABNORMAL HIGH (ref 0.61–1.24)
GFR, Estimated: 48 mL/min — ABNORMAL LOW (ref >60)
Glucose, Bld: 101 mg/dL — ABNORMAL HIGH (ref 70–99)
Potassium: 4.2 mmol/L (ref 3.5–5.1)
Sodium: 137 mmol/L (ref 135–145)

## 2021-08-04 NOTE — Patient Instructions (Addendum)
Thank you for coming in today  Labs were done today, if any labs are abnormal the clinic will call you  Your physician recommends that you schedule a follow-up appointment in:  3 months with Dr. Haroldine Laws  At the [facility], you and your health needs are our priority. As part of our continuing mission to provide you with exceptional heart care, we have created designated Provider Care Teams. These Care Teams include your primary Cardiologist (physician) and Advanced Practice Providers (APPs- Physician Assistants and Nurse Practitioners) who all work together to provide you with the care you need, when you need it.   You may see any of the following providers on your designated Care Team at your next follow up: Dr Glori Bickers Dr Haynes Kerns, NP Lyda Jester, [place] [facility] [place], [place] Audry Riles, PharmD   Please be sure to bring in all your medications bottles to every appointment.   If you have any questions or concerns before your next appointment please send [place] a message through [place] or call our office at [phone].    TO LEAVE A MESSAGE FOR THE NURSE SELECT OPTION 2, PLEASE LEAVE A MESSAGE INCLUDING: YOUR NAME DATE OF BIRTH CALL BACK NUMBER REASON FOR CALL**this is important as we prioritize the call backs  YOU WILL RECEIVE A CALL BACK THE SAME DAY AS LONG AS YOU CALL BEFORE 4:00 PM

## 2021-08-05 ENCOUNTER — Telehealth (HOSPITAL_COMMUNITY): Payer: Self-pay

## 2021-08-05 DIAGNOSIS — I5042 Chronic combined systolic (congestive) and diastolic (congestive) heart failure: Secondary | ICD-10-CM

## 2021-08-05 NOTE — Telephone Encounter (Addendum)
Spoke with patient. Repeat labs booked. Wants to discuss the use of Farxgia with his wife.Stated he would call us back once discussed  ----- Message from Rafael Bihari, [place] sent at 08/05/2021  1:38 PM EST ----- BNP remains elevated and kidney function slightly elevated above his baseline.   Would like to start [place] if he is agreeable, this will help with the fluid, protect kidneys and heart.   Repeat BMET in 2-3 weeks, regardless if he would like to start [place] or not.

## 2021-08-11 ENCOUNTER — Emergency Department (HOSPITAL_COMMUNITY): Payer: BC Managed Care – PPO

## 2021-08-11 ENCOUNTER — Emergency Department (HOSPITAL_COMMUNITY)
Admission: EM | Admit: 2021-08-11 | Discharge: 2021-08-11 | Disposition: A | Discharge status: Home / Self Care | Payer: BC Managed Care – PPO | Attending: Emergency Medicine | Admitting: Emergency Medicine

## 2021-08-11 ENCOUNTER — Other Ambulatory Visit: Payer: Self-pay

## 2021-08-11 ENCOUNTER — Encounter (HOSPITAL_COMMUNITY): Payer: Self-pay | Admitting: *Deleted

## 2021-08-11 DIAGNOSIS — Z20822 Contact with and (suspected) exposure to covid-19: Secondary | ICD-10-CM | POA: Diagnosis not present

## 2021-08-11 DIAGNOSIS — E871 Hypo-osmolality and hyponatremia: Secondary | ICD-10-CM | POA: Diagnosis not present

## 2021-08-11 DIAGNOSIS — R0602 Shortness of breath: Secondary | ICD-10-CM | POA: Diagnosis not present

## 2021-08-11 DIAGNOSIS — I517 Cardiomegaly: Secondary | ICD-10-CM | POA: Diagnosis not present

## 2021-08-11 DIAGNOSIS — J40 Bronchitis, not specified as acute or chronic: Secondary | ICD-10-CM | POA: Diagnosis not present

## 2021-08-11 DIAGNOSIS — J209 Acute bronchitis, unspecified: Secondary | ICD-10-CM

## 2021-08-11 DIAGNOSIS — I509 Heart failure, unspecified: Secondary | ICD-10-CM | POA: Diagnosis not present

## 2021-08-11 DIAGNOSIS — R079 Chest pain, unspecified: Secondary | ICD-10-CM

## 2021-08-11 DIAGNOSIS — R0789 Other chest pain: Secondary | ICD-10-CM | POA: Diagnosis not present

## 2021-08-11 LAB — BASIC METABOLIC PANEL
Anion gap: 7 (ref 5–15)
BUN: 29 mg/dL — ABNORMAL HIGH (ref 6–20)
CO2: 24 mmol/L (ref 22–32)
Calcium: 8.4 mg/dL — ABNORMAL LOW (ref 8.9–10.3)
Chloride: 102 mmol/L (ref 98–111)
Creatinine, Ser: 1.86 mg/dL — ABNORMAL HIGH (ref 0.61–1.24)
GFR, Estimated: 46 mL/min — ABNORMAL LOW (ref >60)
Glucose, Bld: 105 mg/dL — ABNORMAL HIGH (ref 70–99)
Potassium: 3.8 mmol/L (ref 3.5–5.1)
Sodium: 133 mmol/L — ABNORMAL LOW (ref 135–145)

## 2021-08-11 LAB — RESP PANEL BY RT-PCR (FLU A&B, COVID) ARPGX2
Influenza A by PCR: NEGATIVE
Influenza B by PCR: NEGATIVE
SARS Coronavirus 2 by RT PCR: NEGATIVE

## 2021-08-11 LAB — CBC
HCT: 39.8 % (ref 39.0–52.0)
Hemoglobin: 13 g/dL (ref 13.0–17.0)
MCH: 28.3 pg (ref 26.0–34.0)
MCHC: 32.7 g/dL (ref 30.0–36.0)
MCV: 86.5 fL (ref 80.0–100.0)
Platelets: 199 10*3/uL (ref 150–400)
RBC: 4.6 MIL/uL (ref 4.22–5.81)
RDW: 15.5 % (ref 11.5–15.5)
WBC: 7 10*3/uL (ref 4.0–10.5)
nRBC: 0 % (ref 0.0–0.2)

## 2021-08-11 LAB — TROPONIN I (HIGH SENSITIVITY)
Troponin I (High Sensitivity): 37 ng/L — ABNORMAL HIGH (ref <18)
Troponin I (High Sensitivity): 38 ng/L — ABNORMAL HIGH (ref <18)

## 2021-08-11 LAB — BRAIN NATRIURETIC PEPTIDE: B Natriuretic Peptide: 2439 pg/mL — ABNORMAL HIGH (ref 0.0–100.0)

## 2021-08-11 MED ORDER — BENZONATATE 100 MG PO CAPS: 100.0000 mg | ORAL_CAPSULE | Freq: Three times a day (TID) | ORAL | 0 refills | Status: DC

## 2021-08-11 MED ORDER — AZITHROMYCIN 250 MG PO TABS
500.0000 mg | ORAL_TABLET | Freq: Once | ORAL | Status: AC
  Administered 2021-08-11: 500 mg via ORAL
  Filled 2021-08-11: qty 2

## 2021-08-11 MED ORDER — BENZONATATE 100 MG PO CAPS
200.0000 mg | ORAL_CAPSULE | Freq: Once | ORAL | Status: AC
  Administered 2021-08-11: 200 mg via ORAL
  Filled 2021-08-11: qty 2

## 2021-08-11 MED ORDER — AZITHROMYCIN 250 MG PO TABS: 250.0000 mg | ORAL_TABLET | Freq: Every day | ORAL | 0 refills | Status: DC

## 2021-08-11 NOTE — ED Provider Notes (Signed)
[name] [name] Provider Note   CSN: [id number] Arrival date & time: 08/11/21  1815     History  Chief Complaint  Patient presents with   Chest Pain    [name] [name] is a 41 y.o. male.  He said he has been coughing hard for few weeks.  Has had some greenish sputum.  No fevers or chills.  Chest is been sore from coughing and now today felt worse.  History of CHF, compliant with medications.  History of aortic valve replacement not on anticoagulation.  Denies tobacco.  The history is provided by the patient.  Chest Pain Pain location:  L chest Pain quality: aching   Pain radiates to:  Does not radiate Pain severity:  Moderate Onset quality:  Gradual Duration:  2 weeks Timing:  Intermittent Progression:  Worsening Chronicity:  Recurrent Relieved by:  Nothing Worsened by:  Coughing Ineffective treatments:  None tried Associated symptoms: cough   Associated symptoms: no abdominal pain, no fever, no headache, no lower extremity edema, no nausea, no shortness of breath and no vomiting   Risk factors: hypertension   Risk factors: no smoking       Home Medications Prior to Admission medications   Medication Sig Start Date End Date Taking? Authorizing Provider  acetaminophen (TYLENOL) 500 MG tablet Take 1,000 mg by mouth every 6 (six) hours as needed for moderate pain or headache.     [provider]  allopurinol (ZYLOPRIM) 300 MG tablet Take 1 tablet (300 mg total) by mouth daily. 06/12/21   Sanjuana Kava, MD  ALPRAZolam Duanne Moron) 0.5 MG tablet Take 0.5 mg by mouth at bedtime. 09/18/20   [provider]  carvedilol (COREG) 25 MG tablet TAKE 1 TABLET BY MOUTH TWICE DAILY WITH MEALS 09/23/20   Bensimhon, Shaune Pascal, MD  colchicine 0.6 MG tablet One by mouth three times a day for five days for gout pain. 06/12/21   Sanjuana Kava, MD  ENTRESTO 97-103 MG Take 1 tablet by mouth twice daily 09/23/20   Bensimhon, Shaune Pascal, MD  furosemide (LASIX) 80 MG  tablet Take 1 tablet by mouth once daily 06/23/21   Bensimhon, Shaune Pascal, MD  hydrALAZINE (APRESOLINE) 25 MG tablet TAKE 1 TABLET BY MOUTH THREE TIMES DAILY 09/23/20   Bensimhon, Shaune Pascal, MD  potassium chloride SA (KLOR-CON M) 20 MEQ tablet Take 1 tablet by mouth once daily 06/13/21   Bensimhon, Shaune Pascal, MD      Allergies    Patient has no known allergies.    Review of Systems   Review of Systems  Constitutional:  Negative for fever.  HENT:  Negative for sore throat.   Eyes:  Negative for visual disturbance.  Respiratory:  Positive for cough. Negative for shortness of breath.   Cardiovascular:  Positive for chest pain.  Gastrointestinal:  Negative for abdominal pain, nausea and vomiting.  Genitourinary:  Negative for dysuria.  Musculoskeletal:  Negative for neck pain.  Skin:  Negative for rash.  Neurological:  Negative for headaches.   Physical Exam Updated Vital Signs BP 122/69 (BP Location: Right Arm)    Pulse 96    Temp 98.2 F (36.8 C) (Oral)    Resp 19    Ht 5\' 10"  (1.778 m)    Wt 97.8 kg    SpO2 98%    BMI 30.95 kg/m  Physical Exam Vitals and nursing note reviewed.  Constitutional:      General: He is not in acute distress.  Appearance: He is well-developed.  HENT:     Head: Normocephalic and atraumatic.  Eyes:     Conjunctiva/sclera: Conjunctivae normal.  Cardiovascular:     Rate and Rhythm: Normal rate and regular rhythm.     Heart sounds: No murmur heard. Pulmonary:     Effort: Pulmonary effort is normal. No respiratory distress.     Breath sounds: Normal breath sounds.  Abdominal:     Palpations: Abdomen is soft.     Tenderness: There is no abdominal tenderness.  Musculoskeletal:        General: No swelling. Normal range of motion.     Cervical back: Neck supple.     Right lower leg: No tenderness. No edema.     Left lower leg: No tenderness. No edema.  Skin:    General: Skin is warm and dry.     Capillary Refill: Capillary refill takes less than 2  seconds.  Neurological:     General: No focal deficit present.     Mental Status: He is alert.    ED Results / Procedures / Treatments   Labs (all labs ordered are listed, but only abnormal results are displayed) Labs Reviewed  BASIC METABOLIC PANEL - Abnormal; Notable for the following components:      Result Value   Sodium 133 (*)    Glucose, Bld 105 (*)    BUN 29 (*)    Creatinine, Ser 1.86 (*)    Calcium 8.4 (*)    GFR, Estimated 46 (*)    All other components within normal limits  BRAIN NATRIURETIC PEPTIDE - Abnormal; Notable for the following components:   B Natriuretic Peptide 2,439.0 (*)    All other components within normal limits  TROPONIN I (HIGH SENSITIVITY) - Abnormal; Notable for the following components:   Troponin I (High Sensitivity) 37 (*)    All other components within normal limits  TROPONIN I (HIGH SENSITIVITY) - Abnormal; Notable for the following components:   Troponin I (High Sensitivity) 38 (*)    All other components within normal limits  RESP PANEL BY RT-PCR (FLU A&B, COVID) [id number]  CBC    EKG EKG Interpretation  Date/Time:  Monday August 11 2021 18:34:50 EST Ventricular Rate:  92 PR Interval:  204 QRS Duration: 124 QT Interval:  377 QTC Calculation: 467 R Axis:   -70 Text Interpretation: Sinus rhythm Borderline prolonged PR interval Probable left atrial enlargement Nonspecific IVCD with LAD LVH with secondary repolarization abnormality Anterior ST elevation, probably due to LVH Baseline wander in lead(s) V2 No significant change since prior 7/22 Confirmed by Aletta Edouard [phone]) on 08/11/2021 6:38:40 PM  Radiology DG Chest Portable 1 View  Result Date: 08/11/2021 CLINICAL DATA:  Chest pain and shortness of breath. EXAM: PORTABLE CHEST 1 VIEW COMPARISON:  Chest x-ray 01/22/2018. CT chest 10/02/2020. FINDINGS: Cardiac silhouette is markedly enlarged, unchanged. Sternotomy wires are present. The lungs are clear. There is no pleural effusion  or pneumothorax. No acute fractures are seen. IMPRESSION: 1. Stable marked cardiomegaly. 2. The lungs are clear. Electronically Signed   By: Ronney Asters M.D.   On: 08/11/2021 18:48    Procedures Procedures    Medications Ordered in ED Medications  azithromycin (ZITHROMAX) tablet 500 mg (500 mg Oral Given 08/11/21 2248)  benzonatate (TESSALON) capsule 200 mg (200 mg Oral Given 08/11/21 2249)    ED Course/ Medical Decision Making/ A&P Clinical Course as of 08/12/21 0857  Mon Aug 11, 2021  1846 Patient's chest x-ray shows marked  cardiomegaly no acute infiltrates.  Awaiting radiology reading. [MB]    Clinical Course User Index [MB] Hayden Rasmussen, MD                           Medical Decision Making Amount and/or Complexity of Data Reviewed Labs: ordered. Radiology: ordered.  Risk Prescription drug management.  ESTON HESLIN was evaluated in Emergency Department on 08/11/2021 for the symptoms described in the history of present illness. He was evaluated in the context of the global COVID-19 pandemic, which necessitated consideration that the patient might be at risk for infection with the SARS-CoV-2 virus that causes COVID-19. Institutional protocols and algorithms that pertain to the evaluation of patients at risk for COVID-19 are in a state of rapid change based on information released by regulatory bodies including the CDC and federal and state organizations. These policies and algorithms were followed during the patient's care in the ED.  This patient complains of cough productive of sputum, chest pain; this involves an extensive number of treatment Options and is a complaint that carries with it a high risk of complications and Morbidity. The differential includes bronchitis, pneumonia, COVID flu, CHF, ACS, pneumothorax, PE  I ordered, reviewed and interpreted labs, which included CBC with normal white count normal hemoglobin, chemistries with mildly low sodium, elevated  BUN/creatinine stable from priors, troponins mildly elevated but flat, BNP elevated similar to priors, COVID and flu negative I ordered medication Tessalon Perles and Zithromax for bronchitis I ordered imaging studies which included chest x-ray and I independently    visualized and interpreted imaging which showed cardiomegaly no gross infiltrates overt congestive heart failure  Previous records obtained and reviewed in epic including prior cardiology notes  After the interventions stated above, I reevaluated the patient and found patient to be oxygenating well in no distress.  Reviewed results of work-up with him.  No indications for admission at this time.  Will cover with antibiotics and cough medicine for his symptoms.  Recommended close follow-up with his cardiologist to make sure they if they want to adjust any of his medications.  Return instructions discussed          Final Clinical Impression(s) / ED Diagnoses Final diagnoses:  Nonspecific chest pain  Acute bronchitis, unspecified organism    Rx / DC Orders ED Discharge Orders          Ordered    azithromycin (ZITHROMAX) 250 MG tablet  Daily        08/11/21 2231    benzonatate (TESSALON) 100 MG capsule  Every 8 hours        08/11/21 2231              Hayden Rasmussen, MD 08/12/21 0900

## 2021-08-11 NOTE — ED Notes (Signed)
Pt care taken, no complaints at this time. 

## 2021-08-11 NOTE — Discharge Instructions (Addendum)
You are seen in the emergency department for cough and some chest pain that have been going on for 2 weeks.  Your chest x-ray did not show an obvious pneumonia.  Your heart enzymes and congestive heart failure numbers were mildly elevated.  We are treating you with antibiotics and cough medicine for possible bronchitis.  Please contact your cardiology team and have them review her labs to make sure there is nothing else different they want to do with your regular medications.  Return if any worsening or concerning symptoms

## 2021-08-11 NOTE — ED Triage Notes (Signed)
Pt with left chest pain for past 3 hours.  Squeezing in nature, SOB, denies any N/V.  Pt with hx of CHF, denies any weight gain.

## 2021-08-19 ENCOUNTER — Ambulatory Visit (HOSPITAL_COMMUNITY)
Admission: RE | Admit: 2021-08-19 | Discharge: 2021-08-19 | Disposition: A | Discharge status: Home / Self Care | Payer: BC Managed Care – PPO | Source: Ambulatory Visit | Attending: Family Medicine | Admitting: Family Medicine

## 2021-08-19 ENCOUNTER — Other Ambulatory Visit (HOSPITAL_COMMUNITY): Payer: BC Managed Care – PPO

## 2021-08-19 ENCOUNTER — Other Ambulatory Visit: Payer: Self-pay

## 2021-08-19 ENCOUNTER — Encounter (HOSPITAL_COMMUNITY): Payer: Self-pay

## 2021-08-19 VITALS — BP 102/68 | HR 77 | Wt 220.4 lb

## 2021-08-19 DIAGNOSIS — E875 Hyperkalemia: Secondary | ICD-10-CM | POA: Insufficient documentation

## 2021-08-19 DIAGNOSIS — I10 Essential (primary) hypertension: Secondary | ICD-10-CM | POA: Diagnosis not present

## 2021-08-19 DIAGNOSIS — N1831 Chronic kidney disease, stage 3a: Secondary | ICD-10-CM | POA: Diagnosis not present

## 2021-08-19 DIAGNOSIS — I5022 Chronic systolic (congestive) heart failure: Secondary | ICD-10-CM | POA: Diagnosis not present

## 2021-08-19 DIAGNOSIS — I428 Other cardiomyopathies: Secondary | ICD-10-CM | POA: Diagnosis not present

## 2021-08-19 DIAGNOSIS — N183 Chronic kidney disease, stage 3 unspecified: Secondary | ICD-10-CM | POA: Diagnosis not present

## 2021-08-19 DIAGNOSIS — Z9889 Other specified postprocedural states: Secondary | ICD-10-CM | POA: Diagnosis not present

## 2021-08-19 DIAGNOSIS — J40 Bronchitis, not specified as acute or chronic: Secondary | ICD-10-CM | POA: Diagnosis not present

## 2021-08-19 DIAGNOSIS — R002 Palpitations: Secondary | ICD-10-CM

## 2021-08-19 DIAGNOSIS — Z79899 Other long term (current) drug therapy: Secondary | ICD-10-CM | POA: Insufficient documentation

## 2021-08-19 DIAGNOSIS — I130 Hypertensive heart and chronic kidney disease with heart failure and stage 1 through stage 4 chronic kidney disease, or unspecified chronic kidney disease: Secondary | ICD-10-CM | POA: Diagnosis not present

## 2021-08-19 DIAGNOSIS — Z952 Presence of prosthetic heart valve: Secondary | ICD-10-CM | POA: Diagnosis not present

## 2021-08-19 DIAGNOSIS — R062 Wheezing: Secondary | ICD-10-CM

## 2021-08-19 LAB — BASIC METABOLIC PANEL
Anion gap: 7 (ref 5–15)
BUN: 21 mg/dL — ABNORMAL HIGH (ref 6–20)
CO2: 19 mmol/L — ABNORMAL LOW (ref 22–32)
Calcium: 8.8 mg/dL — ABNORMAL LOW (ref 8.9–10.3)
Chloride: 108 mmol/L (ref 98–111)
Creatinine, Ser: 1.63 mg/dL — ABNORMAL HIGH (ref 0.61–1.24)
GFR, Estimated: 54 mL/min — ABNORMAL LOW (ref >60)
Glucose, Bld: 117 mg/dL — ABNORMAL HIGH (ref 70–99)
Potassium: 4.5 mmol/L (ref 3.5–5.1)
Sodium: 134 mmol/L — ABNORMAL LOW (ref 135–145)

## 2021-08-19 LAB — HEMOGLOBIN A1C
Hgb A1c MFr Bld: 6 % — ABNORMAL HIGH (ref 4.8–5.6)
Mean Plasma Glucose: 125.5 mg/dL

## 2021-08-19 MED ORDER — DAPAGLIFLOZIN PROPANEDIOL 10 MG PO TABS: 10.0000 mg | ORAL_TABLET | Freq: Every day | ORAL | 11 refills | Status: DC

## 2021-08-19 MED ORDER — ALBUTEROL SULFATE HFA 108 (90 BASE) MCG/ACT IN AERS: 2.0000 | INHALATION_SPRAY | Freq: Four times a day (QID) | RESPIRATORY_TRACT | 2 refills | Status: DC | PRN

## 2021-08-19 NOTE — Progress Notes (Signed)
ReDS Vest / Clip - 08/19/21 1400       ReDS Vest / Clip   Station Marker D    Ruler Value 35    ReDS Value Range Moderate volume overload    ReDS Actual Value 37

## 2021-08-19 NOTE — Patient Instructions (Signed)
INCREASE Torsemide to 80 mg, in the AM and 40 mg in the PM for 5 days then resume normal dose of 80 mg daily thereafter -please take an additional 20 meq of potassium x 5 days  START Farxiga 10 mg, one tab daily  Labs today We will only contact you if something comes back abnormal or we need to make some changes. Otherwise no news is good news!  Labs needed in 10 days  Keep cardiology follow up as scheduled  Do the following things EVERYDAY: Weigh yourself in the morning before breakfast. Write it down and keep it in a log. Take your medicines as prescribed Eat low salt foods--Limit salt (sodium) to 2000 mg per day.  Stay as active as you can everyday Limit all fluids for the day to less than 2 liters  At the [facility], you and your health needs are our priority. As part of our continuing mission to provide you with exceptional heart care, we have created designated Provider Care Teams. These Care Teams include your primary Cardiologist (physician) and Advanced Practice Providers (APPs- Physician Assistants and Nurse Practitioners) who all work together to provide you with the care you need, when you need it.   You may see any of the following providers on your designated Care Team at your next follow up: Dr Glori Bickers Dr Haynes Kerns, NP Lyda Jester, [place] [facility] [place], [place] Audry Riles, PharmD   Please be sure to bring in all your medications bottles to every appointment.   If you have any questions or concerns before your next appointment please send [place] a message through [place] or call our office at [phone].    TO LEAVE A MESSAGE FOR THE NURSE SELECT OPTION 2, PLEASE LEAVE A MESSAGE INCLUDING: YOUR NAME DATE OF BIRTH CALL BACK NUMBER REASON FOR CALL**this is important as we prioritize the call backs  YOU WILL RECEIVE A CALL BACK THE SAME DAY AS LONG AS YOU CALL BEFORE 4:00 PM

## 2021-08-19 NOTE — Progress Notes (Addendum)
Advanced Heart Failure Clinic Note   Patient ID: SULO JANCZAK, male   DOB: 08/21/1980, 41 y.o.   MRN: [id number]  Primary PCP: [facility]  HF Cardiologist: Dr. Haroldine Laws  Reason for Visit: Heart Failure   HPI: Mr. Ellery is a 41 y.o.with systolic HF due to  NICM, hypertension and CKD stage II-III (baseline Cr 1.5-1.7).   He was first diagnosed with HF in 2012 and his EF recovered and was instructed to stop taking carvedilol, spironolactone, and lasix by previous cardiologist. Admitted to [facility] 2/35/57 for acute systolic heart failure. 12/31/11 ProBNP 3777. HIV nonreactive. Thyroid panel normal. Renal ultrasound was negative for obstruction. EF 20%. Discharge weight 193 lbs. Cath with normal coronaries.  Admitted 05/30/2015 with HTN urgency with troponin elevation likely consistent with myocardial strain/demand ischemia in the setting of medication non compliance, having run out 2-3 weeks ago and not refilling them. Placed back on his coreg, hydralazine, and spiro and was symptomatically stable on discharge. No ACE/ARB with CKD and does not tolerate nitrates with headaches. Discharge weight 193 lbs.  Underwent TEE in 9/18 with severe AI. EF 40-45%. Cath with normal coronaries. S/P AV repair 05/11/2017 with Dr. Roxy Manns.  PYP scan negative 10/18  Echo 02/13/19. EF 45% w/ moderate AI. EF out of range for ICD. Echo 3/21 EF 45% moderate AI. LV dimensions stable.   Seen in clinic 09/14/19 and complained of intermittent palpitations. Echo stable  Zio patch NSR w/ 1 brief run of NSVT (4 beats), 4 brief runs of SVT and rare PVCs < 1% burden.   ED visit 10/02/20 with volume overload. Reds Clip 42%. Instructed to increase lasix to 80 mg daily however the medication script remained 40 mg daily. POC US showed EF 35%.   Echo 10/22 EF 30-35% LV dilated. Mild-mod AI Mod-severe central MR.   Follow up 1/23, doing well symptomatically. Did not want to start SGLT2i.  Seen in ED 1/23 with CP + cough.  Treated with tessalon perles and azithromycin for bronchitis.  Today he returns for post ED visit HF follow up. Feeling fatigued and more SOB. He is SOB with carrying heavy items at work. Feels run down since being diagnosed with bronchitis. Denies palpitations, CP, dizziness, edema, or PND/Orthopnea. Appetite ok. No fever or chills. Taking all medications. He works Biochemist, clinical for [employer]. Twin boys are now 90 months old.  Cardiac Studies: - Echo (2/14): EF 20-25% - Echo (3/16): EF 40%  - Echo (11/16): EF 20-25% - Echo (11/17): EF 45-50% - Echo (9/18): EF ~45% Severe AI  - Echo (12/18): EF 45-50% . Grade II DD - Echo (2019): EF 30-35%  Moderate aortic regurgitation. RV moderately reduced.  - Echo (8/20): EF 45%, moderate AI - Echo (3/21): EF 45%, Grade II DD, moderate AI  - POC [place] (3/22): EF 35% - Echo (10/22): EF 30-35%, LV dilated, mild-mod AI, moderate-severe central MR.   Review of systems complete and found to be negative unless listed in HPI.   Past Medical History:  Diagnosis Date   Anxiety    CHF (congestive heart failure) (HCC)    Chronic systolic heart failure (HCC)    CKD (chronic kidney disease) stage 2, GFR 60-89 ml/min    Dyspnea    with value issues- "if I dont take my medication"   Essential hypertension, benign    Headache    History of pneumonia    Mitral regurgitation    Moderate   Noncompliance    Nonischemic cardiomyopathy (  [place])    Normal coronaries May 2012, LVEF < 20%   Pneumonia 2011   S/P aortic valve repair 05/11/2017   Complex valvuloplasty including plication of left coronary leaflet and 28mm Biostable HAART ring annuloplasty    Current Outpatient Medications  Medication Sig Dispense Refill   acetaminophen (TYLENOL) 500 MG tablet Take 1,000 mg by mouth every 6 (six) hours as needed for moderate pain or headache.      allopurinol (ZYLOPRIM) 300 MG tablet Take 1 tablet (300 mg total) by mouth daily. 30 tablet 5   ALPRAZolam (XANAX)  0.5 MG tablet Take 0.5 mg by mouth at bedtime.     carvedilol (COREG) 25 MG tablet TAKE 1 TABLET BY MOUTH TWICE DAILY WITH MEALS 60 tablet 11   colchicine 0.6 MG tablet Take 0.6 mg by mouth as needed.     ENTRESTO 97-103 MG Take 1 tablet by mouth twice daily 60 tablet 11   furosemide (LASIX) 80 MG tablet Take 1 tablet by mouth once daily 90 tablet 0   hydrALAZINE (APRESOLINE) 25 MG tablet TAKE 1 TABLET BY MOUTH THREE TIMES DAILY 90 tablet 11   potassium chloride SA (KLOR-CON M) 20 MEQ tablet Take 1 tablet by mouth once daily 30 tablet 11   No current facility-administered medications for this encounter.   BP 102/68    Pulse 77    Wt 100 kg (220 lb 6.4 oz)    SpO2 97%    BMI 31.62 kg/m   Wt Readings from Last 3 Encounters:  08/19/21 100 kg (220 lb 6.4 oz)  08/11/21 97.8 kg (215 lb 11.2 oz)  08/04/21 95.8 kg (211 lb 3.2 oz)   PHYSICAL EXAM: General:  NAD. No resp difficulty HEENT: Normal Neck: Supple. JVP 6-7. Carotids 2+ bilat; no bruits. No lymphadenopathy or thryomegaly appreciated. Cor: PMI nondisplaced. Regular rate & rhythm. No rubs, gallops or murmurs. Lungs: Clear, faint wheeze LUL and RLL Abdomen: Soft, nontender, nondistended. No hepatosplenomegaly. No bruits or masses. Good bowel sounds. Extremities: No cyanosis, clubbing, rash, edema Neuro: Alert & oriented x 3, cranial nerves grossly intact. Moves all 4 extremities w/o difficulty. Affect pleasant.  ReDs: 37%  ASSESSMENT & PLAN: 1) Chronic systolic HF: NICM,   - Echo ~20% in 2013. EF 45-50% in 2016. Suspect due to ongoing AI +/- HTN - s/p AoV repair 10/18. F/u echo 12/18 with EF 45-50% moderate AI - Echo (2019): EF 30-35% Mod AI.  - Echo (8/20): EF 45% (out of range for ICD) - Echo (3/21): EF 45% moderate AI. LV dimensions stable (LVIDd 6.5cm) - Echo (10/22): EF 30-35% LV dilated. Mild-mod AI Mod-severe central MR (LVIDd 6.0cm)  - Worse NYHA II-early III, limited mostly by fatigue, suspect in part limited by recent  viral URI/bronchitis. Volume status mildly up on exam, ReDs 37%. Weight up 9 lbs.  - Start Farxiga 10 mg daily. Discussed rationale for med and potential side effects. - Increase Lasix to 80 mg q AM/40 mg q PM + extra 20 KCL x 5 days, then back to Lasix 80 mg daily. - Continue Coreg 25 mg bid. - Continue Entresto 97/103 mg bid. - Continue hydralazine 25 mg tid (had HAs with Imdur in past).   - Has not been on spiro due to hyperkalemia. - BMET today repeat in 10-14 days. Check A1C, with starting [place].  2) HTN   - Blood pressure well controlled.  - Continue current meds.  3) CKD stage III   - baseline Cr 1.5-1.7  -  Encouraged SGLT2i see above. - Keep SBP < 140. - Labs today.  4) S/P AV repair 05/01/2017 - Continues with mild to moderate AI. - LV moderately dilated. May need to consider TEE if EF not improving.  5) Palpitations   - No further symptoms. - Zio Patch ok.   6) Wheezing - Recently treated for bronchitis. - Faint wheezes on exam, given Rx for albuterol inhaler. - If wheezing continues, consider switching beta blocker to bisoprolol.  Follow up in 2 months with Dr. Haroldine Laws as scheduled.   [place], FNP  08/19/21

## 2021-08-29 ENCOUNTER — Other Ambulatory Visit: Payer: Self-pay

## 2021-08-29 ENCOUNTER — Ambulatory Visit (HOSPITAL_COMMUNITY)
Admission: RE | Admit: 2021-08-29 | Discharge: 2021-08-29 | Disposition: A | Discharge status: Home / Self Care | Payer: BC Managed Care – PPO | Source: Ambulatory Visit | Attending: Internal Medicine | Admitting: Internal Medicine

## 2021-08-29 DIAGNOSIS — I5022 Chronic systolic (congestive) heart failure: Secondary | ICD-10-CM | POA: Insufficient documentation

## 2021-08-29 LAB — BASIC METABOLIC PANEL
Anion gap: 10 (ref 5–15)
BUN: 31 mg/dL — ABNORMAL HIGH (ref 6–20)
CO2: 22 mmol/L (ref 22–32)
Calcium: 8.8 mg/dL — ABNORMAL LOW (ref 8.9–10.3)
Chloride: 105 mmol/L (ref 98–111)
Creatinine, Ser: 1.83 mg/dL — ABNORMAL HIGH (ref 0.61–1.24)
GFR, Estimated: 47 mL/min — ABNORMAL LOW (ref >60)
Glucose, Bld: 99 mg/dL (ref 70–99)
Potassium: 4.2 mmol/L (ref 3.5–5.1)
Sodium: 137 mmol/L (ref 135–145)

## 2021-09-14 ENCOUNTER — Other Ambulatory Visit (HOSPITAL_COMMUNITY): Payer: Self-pay | Admitting: Internal Medicine

## 2021-09-29 DIAGNOSIS — I517 Cardiomegaly: Secondary | ICD-10-CM | POA: Diagnosis not present

## 2021-09-29 DIAGNOSIS — I429 Cardiomyopathy, unspecified: Secondary | ICD-10-CM | POA: Diagnosis not present

## 2021-09-29 DIAGNOSIS — I10 Essential (primary) hypertension: Secondary | ICD-10-CM | POA: Diagnosis not present

## 2021-09-29 DIAGNOSIS — Z6835 Body mass index (BMI) 35.0-35.9, adult: Secondary | ICD-10-CM | POA: Diagnosis not present

## 2021-09-29 DIAGNOSIS — I420 Dilated cardiomyopathy: Secondary | ICD-10-CM | POA: Diagnosis not present

## 2021-09-29 DIAGNOSIS — E6609 Other obesity due to excess calories: Secondary | ICD-10-CM | POA: Diagnosis not present

## 2021-09-29 DIAGNOSIS — J208 Acute bronchitis due to other specified organisms: Secondary | ICD-10-CM | POA: Diagnosis not present

## 2021-09-29 DIAGNOSIS — I5042 Chronic combined systolic (congestive) and diastolic (congestive) heart failure: Secondary | ICD-10-CM | POA: Diagnosis not present

## 2021-09-29 DIAGNOSIS — G4709 Other insomnia: Secondary | ICD-10-CM | POA: Diagnosis not present

## 2021-10-01 ENCOUNTER — Telehealth (HOSPITAL_COMMUNITY): Payer: Self-pay | Admitting: *Deleted

## 2021-10-01 NOTE — Telephone Encounter (Signed)
I called the patient to discuss his concerns. Legs and ankles have been swelling since about 2 weeks after his last follow-up. At last visit in February, started Farxiga and lasix increased for 5 days since he appeared volume overloaded.  ? ?Weight 225 lb at visit with PCP this week. 220 lb at last visit in 02/23 and 211 lb in January. Reports had been sick with viral URI in January. ? ?Denies increasing dyspnea. No change in diet. Adherent with medications. ? ?Recommendations: instructed patient to double lasix (increase to 80 mg BID) X 4 days and take an extra tablet of KCL those days. He confirmed he will call on 03/24 if swelling not beginning to improve. May need f/u in clinic +/- IV diuresis. ? ?BMET in 1 week. Please arrange lab appointment. ?

## 2021-10-01 NOTE — Telephone Encounter (Signed)
Pt left vm at 4:43pm stating that since starting farxiga he has swelling in his feet,legs,and ankles.  ? ?Routed to triage pool and [place] ?

## 2021-10-03 NOTE — Telephone Encounter (Signed)
Left vm for pt to return my call to schedule lab appt.  ?

## 2021-10-07 DIAGNOSIS — J209 Acute bronchitis, unspecified: Secondary | ICD-10-CM | POA: Diagnosis not present

## 2021-10-07 DIAGNOSIS — U071 COVID-19: Secondary | ICD-10-CM | POA: Diagnosis not present

## 2021-10-31 ENCOUNTER — Emergency Department (HOSPITAL_COMMUNITY): Payer: BC Managed Care – PPO

## 2021-10-31 ENCOUNTER — Encounter (HOSPITAL_COMMUNITY): Payer: Self-pay | Admitting: *Deleted

## 2021-10-31 ENCOUNTER — Other Ambulatory Visit: Payer: Self-pay

## 2021-10-31 ENCOUNTER — Emergency Department (HOSPITAL_COMMUNITY)
Admission: EM | Admit: 2021-10-31 | Discharge: 2021-10-31 | Disposition: A | Discharge status: Home / Self Care | Payer: BC Managed Care – PPO | Attending: Emergency Medicine | Admitting: Emergency Medicine

## 2021-10-31 DIAGNOSIS — N50812 Left testicular pain: Secondary | ICD-10-CM

## 2021-10-31 DIAGNOSIS — N183 Chronic kidney disease, stage 3 unspecified: Secondary | ICD-10-CM | POA: Diagnosis not present

## 2021-10-31 DIAGNOSIS — I861 Scrotal varices: Secondary | ICD-10-CM | POA: Diagnosis not present

## 2021-10-31 DIAGNOSIS — R7309 Other abnormal glucose: Secondary | ICD-10-CM | POA: Insufficient documentation

## 2021-10-31 DIAGNOSIS — I130 Hypertensive heart and chronic kidney disease with heart failure and stage 1 through stage 4 chronic kidney disease, or unspecified chronic kidney disease: Secondary | ICD-10-CM | POA: Insufficient documentation

## 2021-10-31 DIAGNOSIS — D649 Anemia, unspecified: Secondary | ICD-10-CM | POA: Insufficient documentation

## 2021-10-31 DIAGNOSIS — Z9889 Other specified postprocedural states: Secondary | ICD-10-CM

## 2021-10-31 DIAGNOSIS — I5042 Chronic combined systolic (congestive) and diastolic (congestive) heart failure: Secondary | ICD-10-CM | POA: Insufficient documentation

## 2021-10-31 DIAGNOSIS — I129 Hypertensive chronic kidney disease with stage 1 through stage 4 chronic kidney disease, or unspecified chronic kidney disease: Secondary | ICD-10-CM | POA: Diagnosis not present

## 2021-10-31 DIAGNOSIS — Z8616 Personal history of COVID-19: Secondary | ICD-10-CM | POA: Insufficient documentation

## 2021-10-31 DIAGNOSIS — Z954 Presence of other heart-valve replacement: Secondary | ICD-10-CM | POA: Diagnosis not present

## 2021-10-31 DIAGNOSIS — N433 Hydrocele, unspecified: Secondary | ICD-10-CM | POA: Diagnosis not present

## 2021-10-31 DIAGNOSIS — I509 Heart failure, unspecified: Secondary | ICD-10-CM

## 2021-10-31 DIAGNOSIS — R808 Other proteinuria: Secondary | ICD-10-CM | POA: Diagnosis not present

## 2021-10-31 DIAGNOSIS — Z7984 Long term (current) use of oral hypoglycemic drugs: Secondary | ICD-10-CM | POA: Insufficient documentation

## 2021-10-31 DIAGNOSIS — I5022 Chronic systolic (congestive) heart failure: Secondary | ICD-10-CM | POA: Diagnosis not present

## 2021-10-31 DIAGNOSIS — Z79899 Other long term (current) drug therapy: Secondary | ICD-10-CM | POA: Diagnosis not present

## 2021-10-31 LAB — COMPREHENSIVE METABOLIC PANEL
ALT: 18 U/L (ref 0–44)
AST: 17 U/L (ref 15–41)
Albumin: 3.3 g/dL — ABNORMAL LOW (ref 3.5–5.0)
Alkaline Phosphatase: 54 U/L (ref 38–126)
Anion gap: 7 (ref 5–15)
BUN: 28 mg/dL — ABNORMAL HIGH (ref 6–20)
CO2: 22 mmol/L (ref 22–32)
Calcium: 8.2 mg/dL — ABNORMAL LOW (ref 8.9–10.3)
Chloride: 109 mmol/L (ref 98–111)
Creatinine, Ser: 1.5 mg/dL — ABNORMAL HIGH (ref 0.61–1.24)
GFR, Estimated: 60 mL/min — ABNORMAL LOW (ref >60)
Glucose, Bld: 121 mg/dL — ABNORMAL HIGH (ref 70–99)
Potassium: 3.5 mmol/L (ref 3.5–5.1)
Sodium: 138 mmol/L (ref 135–145)
Total Bilirubin: 0.7 mg/dL (ref 0.3–1.2)
Total Protein: 6.5 g/dL (ref 6.5–8.1)

## 2021-10-31 LAB — URINALYSIS, ROUTINE W REFLEX MICROSCOPIC
Bacteria, UA: NONE SEEN
Bilirubin Urine: NEGATIVE
Glucose, UA: NEGATIVE mg/dL
Hgb urine dipstick: NEGATIVE
Ketones, ur: NEGATIVE mg/dL
Leukocytes,Ua: NEGATIVE
Nitrite: NEGATIVE
Protein, ur: 100 mg/dL — AB
Specific Gravity, Urine: 1.006 (ref 1.005–1.030)
pH: 6 (ref 5.0–8.0)

## 2021-10-31 LAB — CBC WITH DIFFERENTIAL/PLATELET
Abs Immature Granulocytes: 0.04 10*3/uL (ref 0.00–0.07)
Basophils Absolute: 0 10*3/uL (ref 0.0–0.1)
Basophils Relative: 1 %
Eosinophils Absolute: 0.1 10*3/uL (ref 0.0–0.5)
Eosinophils Relative: 2 %
HCT: 38.5 % — ABNORMAL LOW (ref 39.0–52.0)
Hemoglobin: 12.7 g/dL — ABNORMAL LOW (ref 13.0–17.0)
Immature Granulocytes: 1 %
Lymphocytes Relative: 35 %
Lymphs Abs: 2 10*3/uL (ref 0.7–4.0)
MCH: 27.1 pg (ref 26.0–34.0)
MCHC: 33 g/dL (ref 30.0–36.0)
MCV: 82.1 fL (ref 80.0–100.0)
Monocytes Absolute: 0.4 10*3/uL (ref 0.1–1.0)
Monocytes Relative: 7 %
Neutro Abs: 3.1 10*3/uL (ref 1.7–7.7)
Neutrophils Relative %: 54 %
Platelets: 222 10*3/uL (ref 150–400)
RBC: 4.69 MIL/uL (ref 4.22–5.81)
RDW: 17.1 % — ABNORMAL HIGH (ref 11.5–15.5)
WBC: 5.8 10*3/uL (ref 4.0–10.5)
nRBC: 0 % (ref 0.0–0.2)

## 2021-10-31 MED ORDER — NAPROXEN 375 MG PO TABS: 375.0000 mg | ORAL_TABLET | Freq: Two times a day (BID) | ORAL | 0 refills | Status: DC

## 2021-10-31 MED ORDER — SODIUM CHLORIDE 0.9 % IV BOLUS
500.0000 mL | Freq: Once | INTRAVENOUS | Status: AC
  Administered 2021-10-31: 500 mL via INTRAVENOUS

## 2021-10-31 MED ORDER — FENTANYL CITRATE PF 50 MCG/ML IJ SOSY
50.0000 ug | PREFILLED_SYRINGE | Freq: Once | INTRAMUSCULAR | Status: AC
  Administered 2021-10-31: 50 ug via INTRAVENOUS
  Filled 2021-10-31: qty 1

## 2021-10-31 NOTE — Discharge Instructions (Addendum)
You have been provided the contact information for a Urologist by the name of Dr. Alyson Ingles.  Please call and contact them to schedule an appointment as soon as possible, preferably within the next 3 to 5 days for consultation and possible treatment. ? ?Please call and schedule a follow-up appointment with your primary care within the next week or so for reevaluation of elevated protein in your urine and noncritical but seemingly progressing anemia. ? ?If your urine culture shows anything abnormal, you will receive a phone call with further instructions. ? ?A prescription has been called into your pharmacy by the name of naproxen.  This may help provide additional pain relief until you are able to follow-up with urology. ? ?Return to the ED for new or worsening symptoms as discussed. ?

## 2021-10-31 NOTE — ED Triage Notes (Signed)
Pt in c/o L testicular pain onset yesterday worsening now, hx of testicular torsion, pt states, "it feels the same" pt L testicle is swollen, denies penile discharge, denies dysuria, A&O x4 ?

## 2021-10-31 NOTE — ED Provider Notes (Signed)
?[facility] ?Provider Note ? ? ?CSN: [id number] ?Arrival date & time: 10/31/21  1543 ? ?  ? ?History ? ?Chief Complaint  ?Patient presents with  ? Testicle Pain  ? ? ?[name] [name] is a 41 y.o. male presenting with left testicular pain since 2:30 AM this morning.  Hx of right testicular torsion about 5-10 years ago.  Pain started this morning when he was at work and was rated 10 out of 10.  He said after a few hours the pain is lessened to about a 7 out of 10 and remains constant.  Worsened with contact.  Also associated with lower pelvic pain.  Nothing is relieve the pain.  Denies nausea/vomiting.  Denies any urinary or bowel symptoms or recent contacts with STDs.  Denies recent fever.  Finished recovering from COVID 3 weeks ago.  No history of blood clots or coagulation issues.  Denies history of orchiopexy or any abdominal surgeries.  Denies shortness of breath or chest pain. ? ?Hx of chronic systolic and diastolic heart failure, essential hypertension, CKD stage III, status post aortic valve repair, nonischemic cardiomyopathy ? ?The history is provided by the patient and medical records.  ?Testicle Pain ? ? ?  ? ?Home Medications ?Prior to Admission medications   ?Medication Sig Start Date End Date Taking? Authorizing Provider  ?acetaminophen (TYLENOL) 500 MG tablet Take 1,000 mg by mouth every 6 (six) hours as needed for moderate pain or headache.    Yes [provider]  ?albuterol (VENTOLIN HFA) 108 (90 Base) MCG/ACT inhaler Inhale 2 puffs into the lungs every 6 (six) hours as needed for wheezing or shortness of breath. 08/19/21  Yes [place], Maricela Bo, FNP  ?ALPRAZolam (XANAX) 0.5 MG tablet Take 0.5 mg by mouth at bedtime. 09/18/20  Yes [provider]  ?carvedilol (COREG) 25 MG tablet TAKE 1 TABLET BY MOUTH TWICE DAILY WITH MEALS ?Patient taking differently: Take 25 mg by mouth 2 (two) times daily with a meal. 09/23/20  Yes Bensimhon, Shaune Pascal, MD   ?Chlorpheniramine-Acetaminophen (CORICIDIN HBP COLD/FLU PO) Take 1 tablet by mouth daily as needed (cough).   Yes [provider]  ?dapagliflozin propanediol (FARXIGA) 10 MG TABS tablet Take 1 tablet (10 mg total) by mouth daily before breakfast. 08/19/21  Yes Milford, Maricela Bo, FNP  ?ENTRESTO 97-103 MG Take 1 tablet by mouth twice daily ?Patient taking differently: Take 1 tablet by mouth 2 (two) times daily. 09/23/20  Yes Bensimhon, Shaune Pascal, MD  ?furosemide (LASIX) 80 MG tablet Take 1 tablet by mouth once daily ?Patient taking differently: Take 80 mg by mouth daily. 09/15/21  Yes Bensimhon, Shaune Pascal, MD  ?hydrALAZINE (APRESOLINE) 25 MG tablet TAKE 1 TABLET BY MOUTH THREE TIMES DAILY ?Patient taking differently: Take 25 mg by mouth 3 (three) times daily. 09/23/20  Yes Bensimhon, Shaune Pascal, MD  ?naproxen (NAPROSYN) 375 MG tablet Take 1 tablet (375 mg total) by mouth 2 (two) times daily. 10/31/21  Yes Prince Rome, PA-C  ?potassium chloride SA (KLOR-CON M) 20 MEQ tablet Take 1 tablet by mouth once daily ?Patient taking differently: Take 20 mEq by mouth daily. 06/13/21  Yes Bensimhon, Shaune Pascal, MD  ?allopurinol (ZYLOPRIM) 300 MG tablet Take 1 tablet (300 mg total) by mouth daily. ?Patient not taking: Reported on 10/31/2021 06/12/21   Sanjuana Kava, MD  ?colchicine 0.6 MG tablet Take 0.6 mg by mouth as needed. ?Patient not taking: Reported on 10/31/2021    [provider]  ?   ? ?  Allergies    ?Patient has no known allergies.   ? ?Review of Systems   ?Review of Systems  ?Genitourinary:  Positive for testicular pain.  ? ?Physical Exam ?Updated Vital Signs ?BP 124/74   Pulse 74   Temp 97.8 ?F (36.6 ?C) (Oral)   Resp 18   Ht '5\' 10"'$  (1.778 m)   Wt 98.8 kg   SpO2 95%   BMI 31.25 kg/m?  ?Physical Exam ?Vitals and nursing note reviewed. Exam conducted with a chaperone present.  ?Constitutional:   ?   General: He is not in acute distress. ?   Appearance: Normal appearance. He is well-developed. He is not  ill-appearing or diaphoretic.  ?HENT:  ?   Head: Normocephalic and atraumatic.  ?Eyes:  ?   Conjunctiva/sclera: Conjunctivae normal.  ?Cardiovascular:  ?   Rate and Rhythm: Normal rate and regular rhythm.  ?   Heart sounds: No murmur heard. ?Pulmonary:  ?   Effort: Pulmonary effort is normal. No respiratory distress.  ?   Breath sounds: Normal breath sounds.  ?Abdominal:  ?   Palpations: Abdomen is soft.  ?   Tenderness: There is no abdominal tenderness.  ?Genitourinary: ?   Penis: Normal. No phimosis, paraphimosis, erythema, tenderness, discharge, swelling or lesions.   ?   Testes:     ?   Right: Mass, tenderness or swelling not present. Right testis is descended.     ?   Left: Tenderness and swelling present. Left testis is descended.  ?Musculoskeletal:     ?   General: No swelling.  ?   Cervical back: Neck supple.  ?Skin: ?   General: Skin is warm and dry.  ?   Capillary Refill: Capillary refill takes less than 2 seconds.  ?Neurological:  ?   Mental Status: He is alert.  ?Psychiatric:     ?   Mood and Affect: Mood normal.  ?On this ? ?ED Results / Procedures / Treatments   ?Labs ?(all labs ordered are listed, but only abnormal results are displayed) ?Labs Reviewed  ?COMPREHENSIVE METABOLIC PANEL - Abnormal; Notable for the following components:  ?    Result Value  ? Glucose, Bld 121 (*)   ? BUN 28 (*)   ? Creatinine, Ser 1.50 (*)   ? Calcium 8.2 (*)   ? Albumin 3.3 (*)   ? GFR, Estimated 60 (*)   ? All other components within normal limits  ?CBC WITH DIFFERENTIAL/PLATELET - Abnormal; Notable for the following components:  ? Hemoglobin 12.7 (*)   ? HCT 38.5 (*)   ? RDW 17.1 (*)   ? All other components within normal limits  ?URINALYSIS, ROUTINE W REFLEX MICROSCOPIC - Abnormal; Notable for the following components:  ? Color, Urine STRAW (*)   ? Protein, ur 100 (*)   ? All other components within normal limits  ?URINE CULTURE  ? ? ?EKG ?None ? ?Radiology ?US SCROTUM W/DOPPLER ? ?Result Date: 10/31/2021 ?CLINICAL  DATA:  Left scrotal pain EXAM: SCROTAL ULTRASOUND DOPPLER ULTRASOUND OF THE TESTICLES TECHNIQUE: Complete ultrasound examination of the testicles, epididymis, and other scrotal structures was performed. Color and spectral Doppler ultrasound were also utilized to evaluate blood flow to the testicles. COMPARISON:  None. FINDINGS: Right testicle Measurements: 3.4 x 2.3 x 2.5 cm. No mass or microlithiasis visualized. Left testicle Measurements: 4.5 x 2.8 x 4 cm. No mass or microlithiasis visualized. Left testes measures larger than right without significant increased vascularity. Right epididymis:  Normal in size and appearance.  Left epididymis:  Normal in size and appearance. Hydrocele: Bilateral hydrocele is present, more so on the left side. There are low-level echoes in the hydrocele. Varicocele: There is ectasia of vessels in the left pampiniform plexus measuring up to 3.4 mm suggesting left varicocele. Pulsed Doppler interrogation of both testes demonstrates normal low resistance arterial and venous waveforms bilaterally. There is skin thickening and edema in the left scrotal wall without any loculated fluid collections. IMPRESSION: There is no evidence of testicular torsion. There is homogeneous echogenicity in both testes. Left testis appears larger than right without increased vascularity. This asymmetric appearance may be related to smaller size of the right testis, possibly related to history of remote right testicular torsion. Bilateral hydrocele, more so on the left side. There is ectasia of vessels in the left pampiniform plexus suggesting possible left varicocele. Electronically Signed   By: Elmer Picker M.D.   On: 10/31/2021 17:36   ? ?Procedures ?Procedures  ? ? ?Medications Ordered in ED ?Medications  ?fentaNYL (SUBLIMAZE) injection 50 mcg (50 mcg Intravenous Given 10/31/21 1645)  ?sodium chloride 0.9 % bolus 500 mL (0 mLs Intravenous Stopped 10/31/21 1805)  ? ? ?ED Course/ Medical Decision  Making/ A&P ?  ?                        ?Medical Decision Making ? ?41 y.o. male presents to the ED for concern of Testicle Pain ? Marland Kitchen  This involves an extensive number of treatment options, and is a complaint that carries with it a high risk

## 2021-11-02 LAB — URINE CULTURE: Culture: NO GROWTH

## 2021-11-07 ENCOUNTER — Ambulatory Visit (HOSPITAL_COMMUNITY)
Admission: RE | Admit: 2021-11-07 | Discharge: 2021-11-07 | Disposition: A | Discharge status: Home / Self Care | Payer: BC Managed Care – PPO | Source: Ambulatory Visit | Attending: Internal Medicine | Admitting: Internal Medicine

## 2021-11-07 ENCOUNTER — Telehealth (HOSPITAL_COMMUNITY): Payer: Self-pay | Admitting: *Deleted

## 2021-11-07 ENCOUNTER — Encounter (HOSPITAL_COMMUNITY): Payer: Self-pay | Admitting: Internal Medicine

## 2021-11-07 VITALS — BP 104/60 | HR 71 | Wt 219.2 lb

## 2021-11-07 DIAGNOSIS — N183 Chronic kidney disease, stage 3 unspecified: Secondary | ICD-10-CM | POA: Insufficient documentation

## 2021-11-07 DIAGNOSIS — I5042 Chronic combined systolic (congestive) and diastolic (congestive) heart failure: Secondary | ICD-10-CM | POA: Diagnosis not present

## 2021-11-07 DIAGNOSIS — N1831 Chronic kidney disease, stage 3a: Secondary | ICD-10-CM | POA: Diagnosis not present

## 2021-11-07 DIAGNOSIS — I130 Hypertensive heart and chronic kidney disease with heart failure and stage 1 through stage 4 chronic kidney disease, or unspecified chronic kidney disease: Secondary | ICD-10-CM | POA: Diagnosis not present

## 2021-11-07 DIAGNOSIS — I10 Essential (primary) hypertension: Secondary | ICD-10-CM | POA: Diagnosis not present

## 2021-11-07 DIAGNOSIS — Z7984 Long term (current) use of oral hypoglycemic drugs: Secondary | ICD-10-CM | POA: Insufficient documentation

## 2021-11-07 DIAGNOSIS — I428 Other cardiomyopathies: Secondary | ICD-10-CM | POA: Insufficient documentation

## 2021-11-07 DIAGNOSIS — I340 Nonrheumatic mitral (valve) insufficiency: Secondary | ICD-10-CM | POA: Insufficient documentation

## 2021-11-07 DIAGNOSIS — I5022 Chronic systolic (congestive) heart failure: Secondary | ICD-10-CM | POA: Insufficient documentation

## 2021-11-07 DIAGNOSIS — E875 Hyperkalemia: Secondary | ICD-10-CM | POA: Insufficient documentation

## 2021-11-07 DIAGNOSIS — Z9889 Other specified postprocedural states: Secondary | ICD-10-CM | POA: Diagnosis not present

## 2021-11-07 DIAGNOSIS — R002 Palpitations: Secondary | ICD-10-CM | POA: Insufficient documentation

## 2021-11-07 LAB — BASIC METABOLIC PANEL
Anion gap: 8 (ref 5–15)
BUN: 21 mg/dL — ABNORMAL HIGH (ref 6–20)
CO2: 22 mmol/L (ref 22–32)
Calcium: 8.6 mg/dL — ABNORMAL LOW (ref 8.9–10.3)
Chloride: 108 mmol/L (ref 98–111)
Creatinine, Ser: 1.62 mg/dL — ABNORMAL HIGH (ref 0.61–1.24)
GFR, Estimated: 55 mL/min — ABNORMAL LOW (ref >60)
Glucose, Bld: 108 mg/dL — ABNORMAL HIGH (ref 70–99)
Potassium: 4.2 mmol/L (ref 3.5–5.1)
Sodium: 138 mmol/L (ref 135–145)

## 2021-11-07 LAB — BRAIN NATRIURETIC PEPTIDE: B Natriuretic Peptide: 4004.5 pg/mL — ABNORMAL HIGH (ref 0.0–100.0)

## 2021-11-07 NOTE — Patient Instructions (Signed)
Medication Changes: ? ?None, Continue current medications ? ?Lab Work: ? ?Labs done today, we will call you for abnormal results ? ?Testing/Procedures: ? ?Your physician has requested that you have an echocardiogram. Echocardiography is a painless test that uses sound waves to create images of your heart. It provides your doctor with information about the size and shape of your heart and how well your heart?s chambers and valves are working. This procedure takes approximately one hour. There are no restrictions for this procedure. ? ?Referrals: ? ?None ? ?Special Instructions // Education: ? ?Do the following things EVERYDAY: ?Weigh yourself in the morning before breakfast. Write it down and keep it in a log. ?Take your medicines as prescribed ?Eat low salt foods--Limit salt (sodium) to 2000 mg per day.  ?Stay as active as you can everyday ?Limit all fluids for the day to less than 2 liters ? ? ?Follow-Up in: 4 months ? ?At the [facility], you and your health needs are our priority. We have a designated team specialized in the treatment of Heart Failure. This Care Team includes your primary Heart Failure Specialized Cardiologist (physician), Advanced Practice Providers (APPs- Physician Assistants and Nurse Practitioners), and Pharmacist who all work together to provide you with the care you need, when you need it.  ? ?You may see any of the following providers on your designated Care Team at your next follow up: ? ?Dr Glori Bickers ?Dr Loralie Champagne ?Darrick Grinder, NP ?Lyda Jester, PA ?Jessica Milford,NP ?Marlyce Huge, PA ?Audry Riles, PharmD ? ? ?Please be sure to bring in all your medications bottles to every appointment.  ? ?Need to Contact us: ? ?If you have any questions or concerns before your next appointment please send [place] a message through [place] or call our office at [phone].   ? ?TO LEAVE A MESSAGE FOR THE NURSE SELECT OPTION 2, PLEASE LEAVE A MESSAGE INCLUDING: ?YOUR  NAME ?DATE OF BIRTH ?CALL BACK NUMBER ?REASON FOR CALL**this is important as we prioritize the call backs ? ?YOU WILL RECEIVE A CALL BACK THE SAME DAY AS LONG AS YOU CALL BEFORE 4:00 PM ? ? ?

## 2021-11-07 NOTE — Progress Notes (Signed)
ReDS Vest / Clip - 11/07/21 1100   ? ?  ? ReDS Vest / Clip  ? Station Marker D   ? Ruler Value 33.5   ? ReDS Value Range Low volume   ? ReDS Actual Value 35   ? ?  ?  ? ?  ? ? ?

## 2021-11-07 NOTE — Progress Notes (Signed)
?Advanced Heart Failure Clinic Note  ? ?Patient ID: [name] [name], male   DOB: 05-22-1981, 41 y.o.   MRN: [id number] ? ?Primary PCP: [place]  ?HF Cardiologist: Dr. Haroldine [name] ? ?Reason for Visit: Heart Failure  ? ?HPI: ?[name] [name] is a 41 y.o.with systolic HF due to  NICM, hypertension and CKD stage II-III (baseline Cr 1.5-1.7).  ? ?He was first diagnosed with HF in 2012 and his EF recovered and was instructed to stop taking carvedilol, spironolactone, and lasix by previous cardiologist. Admitted to [facility] 12/24/41 for acute systolic heart failure. 12/31/11 ProBNP 3777. HIV nonreactive. Thyroid panel normal. Renal ultrasound was negative for obstruction. EF 20%. Discharge weight 193 lbs. Cath with normal coronaries. ? ?Admitted 05/30/2015 with HTN urgency with troponin elevation likely consistent with myocardial strain/demand ischemia in the setting of medication non compliance, having run out 2-3 weeks ago and not refilling them. Placed back on his coreg, hydralazine, and spiro and was symptomatically stable on discharge. No ACE/ARB with CKD and does not tolerate nitrates with headaches. Discharge weight 193 lbs. ? ?Underwent TEE in 9/18 with severe AI. EF 40-45%. Cath with normal coronaries. S/P AV repair 05/11/2017 with Dr. Roxy [name]. ? ?PYP scan negative 10/18 ? ?Echo 02/13/19. EF 45% w/ moderate AI. EF out of range for ICD. ?Echo 3/21 EF 45% moderate AI. LV dimensions stable.  ? ?Seen in clinic 09/14/19 and complained of intermittent palpitations. Echo stable  Zio patch NSR w/ 1 brief run of NSVT (4 beats), 4 brief runs of SVT and rare PVCs < 1% burden.  ? ?ED visit 10/02/20 with volume overload. Reds Clip 42%. Instructed to increase lasix to 80 mg daily however the medication script remained 40 mg daily. POC US showed EF 35%.  ? ?Echo 10/22 EF 30-35% LV dilated. Mild-mod AI Mod-severe central MR.  ? ?Here for routine f/u. Started [place] 2 months ago (after some debate). Says after he started, he started to  notice more swelling in legs. No SOB, orthopnea or PND. Weight stable.  ? ?Cardiac Studies: ?- Echo (2/14): EF 20-25% ?- Echo (3/16): EF 40%  ?- Echo (11/16): EF 20-25% ?- Echo (11/17): EF 45-50% ?- Echo (9/18): EF ~45% Severe AI  ?- Echo (12/18): EF 45-50% . Grade II DD ?- Echo (2019): EF 30-35%  Moderate aortic regurgitation. RV moderately reduced.  ?- Echo (8/20): EF 45%, moderate AI ?- Echo (3/21): EF 45%, Grade II DD, moderate AI  ?- POC [place] (3/22): EF 35% ?- Echo (10/22): EF 30-35%, LV dilated, mild-mod AI, moderate-severe central MR. ?  ?Review of systems complete and found to be negative unless listed in HPI.  ? ?Past Medical History:  ?Diagnosis Date  ? Anxiety   ? CHF (congestive heart failure) ([place])   ? Chronic systolic heart failure ([place])   ? CKD (chronic kidney disease) stage 2, GFR 60-89 ml/min   ? Dyspnea   ? with value issues- "if I dont take my medication"  ? Essential hypertension, benign   ? Headache   ? History of pneumonia   ? Mitral regurgitation   ? Moderate  ? Noncompliance   ? Nonischemic cardiomyopathy ([place])   ? Normal coronaries May 2012, LVEF < 20%  ? Pneumonia 2011  ? S/P aortic valve repair 05/11/2017  ? Complex valvuloplasty including plication of left coronary leaflet and 63m Biostable HAART ring annuloplasty  ? ? ?Current Outpatient Medications  ?Medication Sig Dispense Refill  ? acetaminophen (TYLENOL) 500 MG tablet Take 1,000 mg by  mouth every 6 (six) hours as needed for moderate pain or headache.     ? albuterol (VENTOLIN HFA) 108 (90 Base) MCG/ACT inhaler Inhale 2 puffs into the lungs every 6 (six) hours as needed for wheezing or shortness of breath. 8 g 2  ? allopurinol (ZYLOPRIM) 300 MG tablet Take 1 tablet (300 mg total) by mouth daily. 30 tablet 5  ? ALPRAZolam (XANAX) 0.5 MG tablet Take 0.5 mg by mouth at bedtime.    ? carvedilol (COREG) 25 MG tablet TAKE 1 TABLET BY MOUTH TWICE DAILY WITH MEALS 60 tablet 11  ? dapagliflozin propanediol (FARXIGA) 10 MG TABS tablet Take 1  tablet (10 mg total) by mouth daily before breakfast. 30 tablet 11  ? ENTRESTO 97-103 MG Take 1 tablet by mouth twice daily 60 tablet 11  ? furosemide (LASIX) 80 MG tablet Take 1 tablet by mouth once daily 90 tablet 0  ? hydrALAZINE (APRESOLINE) 25 MG tablet TAKE 1 TABLET BY MOUTH THREE TIMES DAILY 90 tablet 11  ? potassium chloride SA (KLOR-CON M) 20 MEQ tablet Take 1 tablet by mouth once daily 30 tablet 11  ? ?No current facility-administered medications for this encounter.  ? ?BP 104/60   Pulse 71   Wt 99.4 kg (219 lb 3.2 oz)   SpO2 96%   BMI 31.45 kg/m?  ? ?Wt Readings from Last 3 Encounters:  ?11/07/21 99.4 kg (219 lb 3.2 oz)  ?10/31/21 98.8 kg (217 lb 13 oz)  ?08/19/21 100 kg (220 lb 6.4 oz)  ? ?PHYSICAL EXAM: ?General:  Well appearing. No resp difficulty ?HEENT: normal ?Neck: supple. JVP 7-8 Carotids 2+ bilat; no bruits. No lymphadenopathy or thryomegaly appreciated. ?Cor: PMI nondisplaced. Regular rate & rhythm. 2/6 MR ?Lungs: clear ?Abdomen: soft, nontender, nondistended. No hepatosplenomegaly. No bruits or masses. Good bowel sounds. ?Extremities: no cyanosis, clubbing, rash, 1+ edema ?Neuro: alert & orientedx3, cranial nerves grossly intact. moves all 4 extremities w/o difficulty. Affect pleasant ? ?ReDs: 35% ? ?ASSESSMENT & PLAN: ?1) Chronic systolic HF: NICM,   ?- Echo ~20% in 2013. EF 45-50% in 2016. Suspect due to ongoing AI +/- HTN ?- s/p AoV repair 10/18. F/u echo 12/18 with EF 45-50% moderate AI ?- Echo (2019): EF 30-35% Mod AI.  ?- Echo (8/20): EF 45% (out of range for ICD) ?- Echo (3/21): EF 45% moderate AI. LV dimensions stable (LVIDd 6.5cm) ?- Echo (10/22): EF 30-35% LV dilated. Mild-mod AI Mod-severe central MR (LVIDd 6.0cm)  ?- Stable NYHA II. Not very active ?- Continue Farxiga 10 mg  ?- Continue lasix 80 daily. Volume slightly up today. Double lasix x 2 days  ?- Continue Coreg 25 mg bid. ?- Continue Entresto 97/103 mg bid. ?- Continue hydralazine 25 mg tid (had HAs with Imdur in past).    ?- Has not been on spiro due to hyperkalemia. ?- Will need repeat echo. If EF <= 35% consider ICD ? ?2) Mitral regurgitation ?- functional. Moderate to severe MR ?- Repeat echo ?- Consider mTEE ? ?3) HTN   ?- Blood pressure well controlled.  ?- Continue current meds. ? ?4) CKD stage III   ?- baseline Cr 1.5-1.7  ?- Continue SGLT2i ?- Labs today. ? ?5) S/P AV repair 05/01/2017 ?- Continues with mild to moderate AI. ?- LV moderately dilated. May need to consider TEE if EF not improving. ? ?6) Palpitations   ?- No further symptoms. ?- Zio Patch ok.  ? ?Glori Bickers, MD  ?11/07/21 ?

## 2021-11-07 NOTE — Telephone Encounter (Signed)
Per Dr Haroldine Laws, ReDS reading 35% today and BNP resulted at >4000, he would like pt to increase Lasix to 80 mg BID for 2 days ? ?Pt is aware, agreeable, and verbalized understanding. ? ?Also advised pt his intermittent FMLA forms have been completed and faxed into [employer], he is thankful ?

## 2021-11-20 ENCOUNTER — Ambulatory Visit (HOSPITAL_COMMUNITY): Payer: BC Managed Care – PPO

## 2021-11-24 ENCOUNTER — Other Ambulatory Visit (HOSPITAL_COMMUNITY): Payer: Self-pay | Admitting: Internal Medicine

## 2021-11-28 ENCOUNTER — Other Ambulatory Visit (HOSPITAL_COMMUNITY): Payer: Self-pay | Admitting: Internal Medicine

## 2021-12-03 ENCOUNTER — Encounter: Payer: Self-pay | Admitting: Orthopedic Surgery

## 2021-12-03 ENCOUNTER — Ambulatory Visit: Payer: BC Managed Care – PPO | Admitting: Orthopedic Surgery

## 2021-12-03 VITALS — BP 110/66 | HR 85 | Ht 70.0 in | Wt 219.0 lb

## 2021-12-03 DIAGNOSIS — M10071 Idiopathic gout, right ankle and foot: Secondary | ICD-10-CM | POA: Diagnosis not present

## 2021-12-03 MED ORDER — COLCHICINE 0.6 MG PO TABS: 0.6000 mg | ORAL_TABLET | Freq: Three times a day (TID) | ORAL | 0 refills | Status: DC

## 2021-12-03 MED ORDER — PREDNISONE 10 MG (21) PO TBPK: ORAL_TABLET | ORAL | 0 refills | Status: DC

## 2021-12-03 NOTE — Progress Notes (Signed)
New Patient Visit  Assessment: [name] [name] is a 41 y.o. male with the following: 1. Acute idiopathic gout involving toe of right foot  Plan: [name] [name] has a history of gout, with excellent response to colchicine and prednisone.  Current symptoms similar to previous symptoms in his left foot.  We will treat as an acute gout flare.  Continue allopurinol.  Provided prescription for colchicine, as well as prednisone.  Follow-up as needed.  Follow-up: Return if symptoms worsen or fail to improve.  Subjective:  Chief Complaint  Patient presents with   Foot Pain    Right great toe, feels like a gout flare up, has been taking allopurinol daily    History of Present Illness: [name] [name] is a 41 y.o. male who presents for evaluation of right foot pain.  He has previously been evaluated by Dr. Luna Glasgow, treated for gout of the left foot.  He has been taking allopurinol daily.  No prior flares in his right foot.  This is been ongoing for the past week or so.  He notes severe pain and tenderness in the right great toe, specifically the MTP joint.   Review of Systems: No fevers or chills No numbness or tingling No chest pain No shortness of breath No bowel or bladder dysfunction No GI distress No headaches   Medical History:  Past Medical History:  Diagnosis Date   Anxiety    CHF (congestive heart failure) (HCC)    Chronic systolic heart failure (HCC)    CKD (chronic kidney disease) stage 2, GFR 60-89 ml/min    Dyspnea    with value issues- "if I dont take my medication"   Essential hypertension, benign    Headache    History of pneumonia    Mitral regurgitation    Moderate   Noncompliance    Nonischemic cardiomyopathy ([place])    Normal coronaries May 2012, LVEF < 20%   Pneumonia 2011   S/P aortic valve repair 05/11/2017   Complex valvuloplasty including plication of left coronary leaflet and 49m Biostable HAART ring annuloplasty    Past Surgical History:   Procedure Laterality Date   AORTIC VALVE REPAIR N/A 05/11/2017   Procedure: AORTIC VALVE REPAIR;  Surgeon: ORexene Alberts MD;  [facility] [facility]  Service: Open Heart Surgery;  Laterality: N/A;   CARDIAC SURGERY     RIGHT/LEFT HEART CATH AND CORONARY ANGIOGRAPHY N/A 04/20/2017   Procedure: RIGHT/LEFT HEART CATH AND CORONARY ANGIOGRAPHY;  Surgeon: BJolaine Artist MD;  [facility];  Service: Cardiovascular;  Laterality: N/A;   SURGERY SCROTAL / TESTICULAR     Testicular torsion   TEE WITHOUT CARDIOVERSION N/A 04/09/2017   Procedure: TRANSESOPHAGEAL ECHOCARDIOGRAM (TEE);  Surgeon: BJolaine Artist MD;  [facility];  Service: Cardiovascular;  Laterality: N/A;   TEE WITHOUT CARDIOVERSION N/A 05/11/2017   Procedure: TRANSESOPHAGEAL ECHOCARDIOGRAM (TEE);  Surgeon: ORexene Alberts MD;  [facility]  Service: Open Heart Surgery;  Laterality: N/A;    Family History  Problem Relation Age of Onset   Breast cancer Mother    Stroke Father    Hypertension Father    Social History   Tobacco Use   Smoking status: Never   Smokeless tobacco: Never  Vaping Use   Vaping Use: Never used  Substance Use Topics   Alcohol use: Yes    Alcohol/week: 0.0 standard drinks    Comment: occasional   Drug use: No    No Known Allergies  Current Meds  Medication Sig   acetaminophen (TYLENOL) 500 MG tablet Take 1,000 mg by mouth every 6 (six) hours as needed for moderate pain or headache.    albuterol (VENTOLIN HFA) 108 (90 Base) MCG/ACT inhaler Inhale 2 puffs into the lungs every 6 (six) hours as needed for wheezing or shortness of breath.   allopurinol (ZYLOPRIM) 300 MG tablet Take 1 tablet (300 mg total) by mouth daily.   ALPRAZolam (XANAX) 0.5 MG tablet Take 0.5 mg by mouth at bedtime.   carvedilol (COREG) 25 MG tablet TAKE 1 TABLET BY MOUTH TWICE DAILY WITH MEALS   colchicine 0.6 MG tablet Take 1 tablet (0.6 mg total) by mouth 3 (three) times daily for 5 days.    dapagliflozin propanediol (FARXIGA) 10 MG TABS tablet Take 1 tablet (10 mg total) by mouth daily before breakfast.   ENTRESTO 97-103 MG Take 1 tablet by mouth twice daily   furosemide (LASIX) 80 MG tablet Take 1 tablet by mouth once daily   hydrALAZINE (APRESOLINE) 25 MG tablet TAKE 1 TABLET BY MOUTH THREE TIMES DAILY   potassium chloride SA (KLOR-CON M) 20 MEQ tablet Take 1 tablet by mouth once daily   predniSONE (STERAPRED UNI-PAK 21 TAB) 10 MG (21) TBPK tablet 10 mg DS 12 as directed    Objective: BP 110/66   Pulse 85   Ht '5\' 10"'$  (1.778 m)   Wt 219 lb (99.3 kg)   BMI 31.42 kg/m   Physical Exam:  General: Alert and oriented. and No acute distress. Gait: Right sided antalgic gait.  Evaluation of the right foot demonstrates some general swelling.  Mild redness around the medial forefoot.  Tenderness to palpation at the first MTP joint.  Squeeze of pain with gentle range of motion.  IMAGING: No new imaging obtained today   New Medications:  Meds ordered this encounter  Medications   colchicine 0.6 MG tablet    Sig: Take 1 tablet (0.6 mg total) by mouth 3 (three) times daily for 5 days.    Dispense:  15 tablet    Refill:  0   predniSONE (STERAPRED UNI-PAK 21 TAB) 10 MG (21) TBPK tablet    Sig: 10 mg DS 12 as directed    Dispense:  48 tablet    Refill:  0      Mordecai Rasmussen, MD  12/03/2021 10:59 PM

## 2021-12-03 NOTE — Patient Instructions (Signed)
Note for work, excuse from work [employer].  Ok to return Friday

## 2021-12-09 ENCOUNTER — Ambulatory Visit (HOSPITAL_COMMUNITY): Payer: BC Managed Care – PPO | Attending: Internal Medicine

## 2021-12-09 ENCOUNTER — Encounter (HOSPITAL_COMMUNITY): Payer: Self-pay

## 2021-12-22 ENCOUNTER — Emergency Department (HOSPITAL_COMMUNITY): Payer: BC Managed Care – PPO

## 2021-12-22 ENCOUNTER — Inpatient Hospital Stay (HOSPITAL_COMMUNITY)
Admission: EM | Admit: 2021-12-22 | Discharge: 2021-12-25 | DRG: 682 | Disposition: A | Discharge status: Home / Self Care | Payer: BC Managed Care – PPO | Attending: Family Medicine | Admitting: Family Medicine

## 2021-12-22 ENCOUNTER — Encounter (HOSPITAL_COMMUNITY): Payer: Self-pay | Admitting: Emergency Medicine

## 2021-12-22 ENCOUNTER — Other Ambulatory Visit: Payer: Self-pay

## 2021-12-22 DIAGNOSIS — T148XXA Other injury of unspecified body region, initial encounter: Secondary | ICD-10-CM | POA: Diagnosis present

## 2021-12-22 DIAGNOSIS — N179 Acute kidney failure, unspecified: Principal | ICD-10-CM | POA: Diagnosis present

## 2021-12-22 DIAGNOSIS — I5023 Acute on chronic systolic (congestive) heart failure: Secondary | ICD-10-CM | POA: Diagnosis present

## 2021-12-22 DIAGNOSIS — R079 Chest pain, unspecified: Secondary | ICD-10-CM | POA: Diagnosis not present

## 2021-12-22 DIAGNOSIS — I110 Hypertensive heart disease with heart failure: Secondary | ICD-10-CM | POA: Diagnosis not present

## 2021-12-22 DIAGNOSIS — F419 Anxiety disorder, unspecified: Secondary | ICD-10-CM | POA: Diagnosis present

## 2021-12-22 DIAGNOSIS — Z20822 Contact with and (suspected) exposure to covid-19: Secondary | ICD-10-CM | POA: Diagnosis present

## 2021-12-22 DIAGNOSIS — W57XXXA Bitten or stung by nonvenomous insect and other nonvenomous arthropods, initial encounter: Secondary | ICD-10-CM | POA: Diagnosis not present

## 2021-12-22 DIAGNOSIS — Z8249 Family history of ischemic heart disease and other diseases of the circulatory system: Secondary | ICD-10-CM

## 2021-12-22 DIAGNOSIS — R778 Other specified abnormalities of plasma proteins: Secondary | ICD-10-CM | POA: Diagnosis present

## 2021-12-22 DIAGNOSIS — N182 Chronic kidney disease, stage 2 (mild): Secondary | ICD-10-CM | POA: Diagnosis not present

## 2021-12-22 DIAGNOSIS — Z28310 Unvaccinated for covid-19: Secondary | ICD-10-CM | POA: Diagnosis not present

## 2021-12-22 DIAGNOSIS — R0602 Shortness of breath: Secondary | ICD-10-CM | POA: Diagnosis not present

## 2021-12-22 DIAGNOSIS — Z79899 Other long term (current) drug therapy: Secondary | ICD-10-CM

## 2021-12-22 DIAGNOSIS — R7989 Other specified abnormal findings of blood chemistry: Secondary | ICD-10-CM | POA: Diagnosis present

## 2021-12-22 DIAGNOSIS — I340 Nonrheumatic mitral (valve) insufficiency: Secondary | ICD-10-CM | POA: Diagnosis not present

## 2021-12-22 DIAGNOSIS — I428 Other cardiomyopathies: Secondary | ICD-10-CM | POA: Diagnosis present

## 2021-12-22 DIAGNOSIS — R0789 Other chest pain: Secondary | ICD-10-CM | POA: Diagnosis not present

## 2021-12-22 DIAGNOSIS — I7121 Aneurysm of the ascending aorta, without rupture: Secondary | ICD-10-CM | POA: Diagnosis not present

## 2021-12-22 DIAGNOSIS — I10 Essential (primary) hypertension: Secondary | ICD-10-CM | POA: Diagnosis not present

## 2021-12-22 DIAGNOSIS — I130 Hypertensive heart and chronic kidney disease with heart failure and stage 1 through stage 4 chronic kidney disease, or unspecified chronic kidney disease: Secondary | ICD-10-CM | POA: Diagnosis present

## 2021-12-22 DIAGNOSIS — Z952 Presence of prosthetic heart valve: Secondary | ICD-10-CM

## 2021-12-22 DIAGNOSIS — I5021 Acute systolic (congestive) heart failure: Secondary | ICD-10-CM | POA: Diagnosis not present

## 2021-12-22 DIAGNOSIS — I509 Heart failure, unspecified: Secondary | ICD-10-CM | POA: Diagnosis not present

## 2021-12-22 DIAGNOSIS — R059 Cough, unspecified: Secondary | ICD-10-CM | POA: Diagnosis not present

## 2021-12-22 LAB — BASIC METABOLIC PANEL
Anion gap: 6 (ref 5–15)
BUN: 26 mg/dL — ABNORMAL HIGH (ref 6–20)
CO2: 22 mmol/L (ref 22–32)
Calcium: 8.4 mg/dL — ABNORMAL LOW (ref 8.9–10.3)
Chloride: 107 mmol/L (ref 98–111)
Creatinine, Ser: 2.11 mg/dL — ABNORMAL HIGH (ref 0.61–1.24)
GFR, Estimated: 40 mL/min — ABNORMAL LOW (ref >60)
Glucose, Bld: 111 mg/dL — ABNORMAL HIGH (ref 70–99)
Potassium: 4 mmol/L (ref 3.5–5.1)
Sodium: 135 mmol/L (ref 135–145)

## 2021-12-22 LAB — CBC
HCT: 43.2 % (ref 39.0–52.0)
Hemoglobin: 14.2 g/dL (ref 13.0–17.0)
MCH: 27.8 pg (ref 26.0–34.0)
MCHC: 32.9 g/dL (ref 30.0–36.0)
MCV: 84.5 fL (ref 80.0–100.0)
Platelets: 166 10*3/uL (ref 150–400)
RBC: 5.11 MIL/uL (ref 4.22–5.81)
RDW: 19.1 % — ABNORMAL HIGH (ref 11.5–15.5)
WBC: 8.3 10*3/uL (ref 4.0–10.5)
nRBC: 0 % (ref 0.0–0.2)

## 2021-12-22 LAB — TROPONIN I (HIGH SENSITIVITY)
Troponin I (High Sensitivity): 50 ng/L — ABNORMAL HIGH (ref <18)
Troponin I (High Sensitivity): 49 ng/L — ABNORMAL HIGH (ref <18)

## 2021-12-22 LAB — BRAIN NATRIURETIC PEPTIDE: B Natriuretic Peptide: 3079 pg/mL — ABNORMAL HIGH (ref 0.0–100.0)

## 2021-12-22 LAB — SARS CORONAVIRUS 2 BY RT PCR: SARS Coronavirus 2 by RT PCR: NEGATIVE

## 2021-12-22 MED ORDER — SODIUM CHLORIDE 0.9 % IV BOLUS
500.0000 mL | Freq: Once | INTRAVENOUS | Status: AC
  Administered 2021-12-22: 500 mL via INTRAVENOUS

## 2021-12-22 MED ORDER — ACETAMINOPHEN 500 MG PO TABS
1000.0000 mg | ORAL_TABLET | Freq: Once | ORAL | Status: AC
  Administered 2021-12-22: 1000 mg via ORAL
  Filled 2021-12-22: qty 2

## 2021-12-22 MED ORDER — BENZONATATE 100 MG PO CAPS
100.0000 mg | ORAL_CAPSULE | Freq: Once | ORAL | Status: AC
  Administered 2021-12-22: 100 mg via ORAL
  Filled 2021-12-22: qty 1

## 2021-12-22 NOTE — ED Triage Notes (Signed)
Pt having left-sided chest pain that started this afternoon, pt has history of CHF.

## 2021-12-22 NOTE — ED Provider Notes (Signed)
[facility] EMERGENCY DEPARTMENT Provider Note   CSN: [id number] Arrival date & time: 12/22/21  1840     History  Chief Complaint  Patient presents with   Chest Pain    [name] [name] is a 41 y.o. male.  [name] [name] is a 41 y.o. male with a history of non-ischemic cardiomyopathy and chronic systolic heart failure, hypertension, CKD, who presents to the emergency department for evaluation of chest pain.  Patient reports left-sided chest pain and feeling ill.  He reports that yesterday while at work he started feeling a bit under the weather with an occasional cough and some shortness of breath.  No noted fever.  She felt a bit better this morning and so went to work, but while at work this morning symptoms worsen and he reports he had an episode where he got very sweaty and started to have left-sided chest pain and shortness of breath.  Upon arrival symptoms have eased off, but given patient's heart history who was concerned.  He denies any radiation of pain to the arm neck or jaw.  He does report that it seemed to improve with rest.  No pleuritic pain.  Reports that he has been compliant with all of his heart failure medications, follows with Dr. Haroldine Laws.  Reports that his weights have been stable and he has not had worsening swelling in his legs.  Was supposed to have an echo recently but had to reschedule the appointment.  The history is provided by the patient and medical records.  Chest Pain Associated symptoms: cough, diaphoresis and shortness of breath   Associated symptoms: no abdominal pain, no nausea and no vomiting        Home Medications Prior to Admission medications   Medication Sig Start Date End Date Taking? Authorizing Provider  acetaminophen (TYLENOL) 500 MG tablet Take 1,000 mg by mouth every 6 (six) hours as needed for moderate pain or headache.    Yes [provider]  albuterol (VENTOLIN HFA) 108 (90 Base) MCG/ACT inhaler Inhale 2 puffs into the  lungs every 6 (six) hours as needed for wheezing or shortness of breath. 08/19/21  Yes [place], Maricela Bo, FNP  allopurinol (ZYLOPRIM) 300 MG tablet Take 1 tablet (300 mg total) by mouth daily. 06/12/21  Yes Sanjuana Kava, MD  ALPRAZolam Duanne Moron) 0.5 MG tablet Take 0.5 mg by mouth at bedtime. 09/18/20  Yes [provider]  carvedilol (COREG) 25 MG tablet TAKE 1 TABLET BY MOUTH TWICE DAILY WITH MEALS 11/28/21  Yes Bensimhon, Shaune Pascal, MD  dapagliflozin propanediol (FARXIGA) 10 MG TABS tablet Take 1 tablet (10 mg total) by mouth daily before breakfast. 08/19/21  Yes [place], [place], [place]  ENTRESTO 97-103 MG Take 1 tablet by mouth twice daily 11/24/21  Yes Bensimhon, Shaune Pascal, MD  furosemide (LASIX) 80 MG tablet Take 1 tablet by mouth once daily 11/24/21  Yes Bensimhon, Shaune Pascal, MD  hydrALAZINE (APRESOLINE) 25 MG tablet TAKE 1 TABLET BY MOUTH THREE TIMES DAILY 11/28/21  Yes Bensimhon, Shaune Pascal, MD  potassium chloride SA (KLOR-CON M) 20 MEQ tablet Take 1 tablet by mouth once daily 06/13/21  Yes Bensimhon, Shaune Pascal, MD  colchicine 0.6 MG tablet Take 1 tablet (0.6 mg total) by mouth 3 (three) times daily for 5 days. Patient not taking: Reported on 12/22/2021 12/03/21 12/08/21  Mordecai Rasmussen, MD  predniSONE (STERAPRED UNI-PAK 21 TAB) 10 MG (21) TBPK tablet 10 mg DS 12 as directed Patient not taking: Reported on 12/22/2021  12/03/21   Mordecai Rasmussen, MD      Allergies    Patient has no known allergies.    Review of Systems   Review of Systems  Constitutional:  Positive for diaphoresis.  Respiratory:  Positive for cough and shortness of breath.   Cardiovascular:  Positive for chest pain.  Gastrointestinal:  Negative for abdominal pain, nausea and vomiting.  Neurological:  Positive for light-headedness. Negative for syncope.    Physical Exam Updated Vital Signs BP 123/67 (BP Location: Right Arm)   Pulse 94   Temp 98.2 F (36.8 C) (Oral)   Resp 20   Ht '5\' 10"'$  (1.778 m)   Wt 99.3 kg   SpO2 96%    BMI 31.41 kg/m  Physical Exam Vitals and nursing note reviewed.  Constitutional:      General: He is not in acute distress.    Appearance: Normal appearance. He is well-developed. He is not ill-appearing or diaphoretic.  HENT:     Head: Normocephalic and atraumatic.  Eyes:     General:        Right eye: No discharge.        Left eye: No discharge.     Pupils: Pupils are equal, round, and reactive to light.  Cardiovascular:     Rate and Rhythm: Normal rate and regular rhythm.     Pulses: Normal pulses.          Radial pulses are 2+ on the right side and 2+ on the left side.       Dorsalis pedis pulses are 2+ on the right side and 2+ on the left side.     Heart sounds: Normal heart sounds. No murmur heard.    No friction rub. No gallop.  Pulmonary:     Effort: Pulmonary effort is normal. No respiratory distress.     Breath sounds: Normal breath sounds. No wheezing or rales.     Comments: Respirations equal and unlabored, patient able to speak in full sentences, lungs clear to auscultation bilaterally  Chest:     Chest wall: No tenderness.  Abdominal:     General: Bowel sounds are normal. There is no distension.     Palpations: Abdomen is soft. There is no mass.     Tenderness: There is no abdominal tenderness. There is no guarding.     Comments: Abdomen soft, nondistended, nontender to palpation in all quadrants without guarding or peritoneal signs  Musculoskeletal:        General: No deformity.     Cervical back: Neck supple.     Right lower leg: No tenderness. No edema.     Left lower leg: No tenderness. No edema.  Skin:    General: Skin is warm and dry.     Capillary Refill: Capillary refill takes less than 2 seconds.  Neurological:     Mental Status: He is alert and oriented to person, place, and time.     Coordination: Coordination normal.     Comments: Speech is clear, able to follow commands Moves extremities without ataxia, coordination intact  Psychiatric:         Mood and Affect: Mood normal.        Behavior: Behavior normal.     ED Results / Procedures / Treatments   Labs (all labs ordered are listed, but only abnormal results are displayed) Labs Reviewed  BASIC METABOLIC PANEL - Abnormal; Notable for the following components:      Result Value   Glucose,  Bld 111 (*)    BUN 26 (*)    Creatinine, Ser 2.11 (*)    Calcium 8.4 (*)    GFR, Estimated 40 (*)    All other components within normal limits  CBC - Abnormal; Notable for the following components:   RDW 19.1 (*)    All other components within normal limits  TROPONIN I (HIGH SENSITIVITY) - Abnormal; Notable for the following components:   Troponin I (High Sensitivity) 50 (*)    All other components within normal limits  SARS CORONAVIRUS 2 BY RT PCR  BRAIN NATRIURETIC PEPTIDE    EKG EKG Interpretation  Date/Time:  Monday December 22 2021 18:49:50 EDT Ventricular Rate:  95 PR Interval:  190 QRS Duration: 114 QT Interval:  354 QTC Calculation: 444 R Axis:   152 Text Interpretation: Sinus rhythm with Premature atrial complexes with Abberant conduction Possible Left atrial enlargement Right axis deviation Incomplete left bundle branch block Left ventricular hypertrophy with repolarization abnormality ( Cornell product ) Abnormal ECG When compared with ECG of 07-Nov-2021 11:59, Abberant conduction is now Present Incomplete left bundle branch block has replaced Non-specific intra-ventricular conduction delay Confirmed by Thamas Jaegers ([id number]) on 12/22/2021 7:53:48 PM  Radiology DG Chest 2 View  Result Date: 12/22/2021 CLINICAL DATA:  Chest pain, shortness of breath, cough EXAM: CHEST - 2 VIEW COMPARISON:  08/11/2021 FINDINGS: Prior median sternotomy. Cardiomegaly. No confluent opacities, effusions or edema. No acute bony abnormality. IMPRESSION: Cardiomegaly.  No active disease. Electronically Signed   By: Rolm Baptise M.D.   On: 12/22/2021 19:27    Procedures Procedures    Medications  Ordered in ED Medications  acetaminophen (TYLENOL) tablet 1,000 mg (has no administration in time range)  benzonatate (TESSALON) capsule 100 mg (has no administration in time range)    ED Course/ Medical Decision Making/ A&P                           Medical Decision Making Amount and/or Complexity of Data Reviewed Labs: ordered. Radiology: ordered.  Risk OTC drugs. Prescription drug management. Decision regarding hospitalization.   FESTUS PURSEL is a 41 y.o. male presents to the ED for concern of chest pain and shortness of breath, this involves an extensive number of treatment options, and is a complaint that carries with it a high risk of complications and morbidity.  The differential diagnosis includes ACS, CHF exacerbation, pneumonia, PE, aortic dissection, pneumothorax, GERD, anxiety, musculoskeletal pain.   Additional history obtained:  Additional history obtained from chart review External records from outside source obtained and reviewed including most recent follow-up with Dr. Britta Mccreedy with cardiology.  As well as patient's prior echoes and heart caths.   Lab Tests:  I Ordered, reviewed, and interpreted labs.  The pertinent results include: No leukocytosis and normal hemoglobin, no significant electrolyte derangements, but slight worsening in renal function, creatinine usually is 1.5-1.6, today 2.11.  BUN slightly elevated as well.  Initial troponin elevated at 50, it looks like patient's typical baseline is in the 20s-30s.  BNP is also elevated above patient's baseline at 3079, typically in the 2000's.   Imaging Studies ordered:  I ordered imaging studies including chest x-ray I independently visualized and interpreted imaging which showed cardiomegaly without pulmonary edema or evidence of pneumonia I agree with the radiologist interpretation   Cardiac Monitoring:  The patient was maintained on a cardiac monitor.  I personally viewed and interpreted the  cardiac monitored which showed an  underlying rhythm of: Normal sinus rhythm with some PACs, incomplete left bundle branch block noted    ED Course:  Patient with chest pain, diaphoresis, shortness of breath and cough.  No fever and no signs of pneumonia, presentation is concerning in setting of patient cardiac history, troponins are flat but increased from patient's baseline and he has elevated BNP given concern for worsening heart failure.  Presentation is not suggestive of aortic dissection or PE.  COVID test is negative.  Suspect patient will need admission for closer monitoring and likely repeat echocardiogram.  Will discuss with cardiology   Consultations Obtained:  I requested consultation with the cardiologist,  and discussed lab and imaging findings as well as pertinent plan -Dr. Doylene Canard with cardiology recommends medicine admission with cardiology consult in the morning, feels patient could stay at any time and does not need transfer to [facility]. Case discussed with Dr. Clearence Ped with [facility] hospitalist including pertinent labs, imaging and plan, she will see and admit the patient.   Dispostion:  After consideration of the diagnostic results and the patients response to treatment feel that the patent would benefit from admission.          Final Clinical Impression(s) / ED Diagnoses Final diagnoses:  Left-sided chest pain  AKI (acute kidney injury) ([place])  Acute on chronic congestive heart failure, unspecified heart failure type [facility])    Rx / DC Orders ED Discharge Orders     None         Janet Berlin 01/05/22 1208    Luna Fuse, MD 01/10/22 [phone]

## 2021-12-22 NOTE — ED Notes (Signed)
Patient transported to X-ray 

## 2021-12-23 ENCOUNTER — Observation Stay (HOSPITAL_COMMUNITY): Payer: BC Managed Care – PPO

## 2021-12-23 DIAGNOSIS — N182 Chronic kidney disease, stage 2 (mild): Secondary | ICD-10-CM | POA: Diagnosis present

## 2021-12-23 DIAGNOSIS — Z79899 Other long term (current) drug therapy: Secondary | ICD-10-CM | POA: Diagnosis not present

## 2021-12-23 DIAGNOSIS — I10 Essential (primary) hypertension: Secondary | ICD-10-CM | POA: Diagnosis not present

## 2021-12-23 DIAGNOSIS — Z28310 Unvaccinated for covid-19: Secondary | ICD-10-CM | POA: Diagnosis not present

## 2021-12-23 DIAGNOSIS — I428 Other cardiomyopathies: Secondary | ICD-10-CM | POA: Diagnosis present

## 2021-12-23 DIAGNOSIS — N179 Acute kidney failure, unspecified: Secondary | ICD-10-CM | POA: Diagnosis not present

## 2021-12-23 DIAGNOSIS — R778 Other specified abnormalities of plasma proteins: Secondary | ICD-10-CM

## 2021-12-23 DIAGNOSIS — R0789 Other chest pain: Secondary | ICD-10-CM | POA: Diagnosis not present

## 2021-12-23 DIAGNOSIS — R079 Chest pain, unspecified: Secondary | ICD-10-CM | POA: Diagnosis not present

## 2021-12-23 DIAGNOSIS — Z8249 Family history of ischemic heart disease and other diseases of the circulatory system: Secondary | ICD-10-CM | POA: Diagnosis not present

## 2021-12-23 DIAGNOSIS — T148XXA Other injury of unspecified body region, initial encounter: Secondary | ICD-10-CM | POA: Diagnosis present

## 2021-12-23 DIAGNOSIS — W57XXXA Bitten or stung by nonvenomous insect and other nonvenomous arthropods, initial encounter: Secondary | ICD-10-CM | POA: Diagnosis present

## 2021-12-23 DIAGNOSIS — I7121 Aneurysm of the ascending aorta, without rupture: Secondary | ICD-10-CM | POA: Diagnosis present

## 2021-12-23 DIAGNOSIS — I340 Nonrheumatic mitral (valve) insufficiency: Secondary | ICD-10-CM | POA: Diagnosis not present

## 2021-12-23 DIAGNOSIS — I509 Heart failure, unspecified: Secondary | ICD-10-CM

## 2021-12-23 DIAGNOSIS — I5023 Acute on chronic systolic (congestive) heart failure: Secondary | ICD-10-CM

## 2021-12-23 DIAGNOSIS — Z20822 Contact with and (suspected) exposure to covid-19: Secondary | ICD-10-CM | POA: Diagnosis present

## 2021-12-23 DIAGNOSIS — Z952 Presence of prosthetic heart valve: Secondary | ICD-10-CM | POA: Diagnosis not present

## 2021-12-23 DIAGNOSIS — F419 Anxiety disorder, unspecified: Secondary | ICD-10-CM | POA: Diagnosis present

## 2021-12-23 DIAGNOSIS — I5021 Acute systolic (congestive) heart failure: Secondary | ICD-10-CM

## 2021-12-23 DIAGNOSIS — I130 Hypertensive heart and chronic kidney disease with heart failure and stage 1 through stage 4 chronic kidney disease, or unspecified chronic kidney disease: Secondary | ICD-10-CM | POA: Diagnosis present

## 2021-12-23 LAB — ECHOCARDIOGRAM COMPLETE
AR max vel: 1.12 cm2
AV Area VTI: 1.06 cm2
AV Area mean vel: 1.07 cm2
AV Mean grad: 16 mmHg
AV Peak grad: 32.6 mmHg
Ao pk vel: 2.86 m/s
Area-P 1/2: 6.54 cm2
Calc EF: 32.7 %
Height: 70 in
MV M vel: 4.21 m/s
MV Peak grad: 70.9 mmHg
MV VTI: 2.07 cm2
P 1/2 time: 363 msec
Radius: 0.53 cm
S' Lateral: 5.7 cm
Single Plane A2C EF: 21.6 %
Single Plane A4C EF: 41.1 %
Weight: 3408 oz

## 2021-12-23 LAB — CBC WITH DIFFERENTIAL/PLATELET
Abs Immature Granulocytes: 0.02 10*3/uL (ref 0.00–0.07)
Basophils Absolute: 0.1 10*3/uL (ref 0.0–0.1)
Basophils Relative: 1 %
Eosinophils Absolute: 0.2 10*3/uL (ref 0.0–0.5)
Eosinophils Relative: 2 %
HCT: 41.7 % (ref 39.0–52.0)
Hemoglobin: 13.7 g/dL (ref 13.0–17.0)
Immature Granulocytes: 0 %
Lymphocytes Relative: 17 %
Lymphs Abs: 1.2 10*3/uL (ref 0.7–4.0)
MCH: 27.6 pg (ref 26.0–34.0)
MCHC: 32.9 g/dL (ref 30.0–36.0)
MCV: 84.1 fL (ref 80.0–100.0)
Monocytes Absolute: 0.7 10*3/uL (ref 0.1–1.0)
Monocytes Relative: 10 %
Neutro Abs: 4.6 10*3/uL (ref 1.7–7.7)
Neutrophils Relative %: 70 %
Platelets: 160 10*3/uL (ref 150–400)
RBC: 4.96 MIL/uL (ref 4.22–5.81)
RDW: 19 % — ABNORMAL HIGH (ref 11.5–15.5)
WBC: 6.7 10*3/uL (ref 4.0–10.5)
nRBC: 0 % (ref 0.0–0.2)

## 2021-12-23 LAB — HIV ANTIBODY (ROUTINE TESTING W REFLEX): HIV Screen 4th Generation wRfx: NONREACTIVE

## 2021-12-23 LAB — COMPREHENSIVE METABOLIC PANEL
ALT: 15 U/L (ref 0–44)
AST: 14 U/L — ABNORMAL LOW (ref 15–41)
Albumin: 3.2 g/dL — ABNORMAL LOW (ref 3.5–5.0)
Alkaline Phosphatase: 52 U/L (ref 38–126)
Anion gap: 5 (ref 5–15)
BUN: 24 mg/dL — ABNORMAL HIGH (ref 6–20)
CO2: 22 mmol/L (ref 22–32)
Calcium: 8.1 mg/dL — ABNORMAL LOW (ref 8.9–10.3)
Chloride: 112 mmol/L — ABNORMAL HIGH (ref 98–111)
Creatinine, Ser: 1.54 mg/dL — ABNORMAL HIGH (ref 0.61–1.24)
GFR, Estimated: 58 mL/min — ABNORMAL LOW (ref >60)
Glucose, Bld: 100 mg/dL — ABNORMAL HIGH (ref 70–99)
Potassium: 4.3 mmol/L (ref 3.5–5.1)
Sodium: 139 mmol/L (ref 135–145)
Total Bilirubin: 1.1 mg/dL (ref 0.3–1.2)
Total Protein: 6 g/dL — ABNORMAL LOW (ref 6.5–8.1)

## 2021-12-23 LAB — MAGNESIUM: Magnesium: 2.3 mg/dL (ref 1.7–2.4)

## 2021-12-23 LAB — TROPONIN I (HIGH SENSITIVITY): Troponin I (High Sensitivity): 42 ng/L — ABNORMAL HIGH (ref <18)

## 2021-12-23 MED ORDER — ONDANSETRON HCL 4 MG/2ML IJ SOLN: 4.0000 mg | Freq: Four times a day (QID) | INTRAMUSCULAR | Status: DC | PRN

## 2021-12-23 MED ORDER — HEPARIN SODIUM (PORCINE) 5000 UNIT/ML IJ SOLN
5000.0000 [IU] | Freq: Three times a day (TID) | INTRAMUSCULAR | Status: DC
  Administered 2021-12-23 – 2021-12-25 (×7): 5000 [IU] via SUBCUTANEOUS
  Filled 2021-12-23 (×7): qty 1

## 2021-12-23 MED ORDER — FUROSEMIDE 10 MG/ML IJ SOLN
60.0000 mg | Freq: Two times a day (BID) | INTRAMUSCULAR | Status: DC
  Administered 2021-12-23 – 2021-12-25 (×5): 60 mg via INTRAVENOUS
  Filled 2021-12-23 (×5): qty 6

## 2021-12-23 MED ORDER — CARVEDILOL 12.5 MG PO TABS
25.0000 mg | ORAL_TABLET | Freq: Two times a day (BID) | ORAL | Status: DC
  Administered 2021-12-23 – 2021-12-25 (×4): 25 mg via ORAL
  Filled 2021-12-23 (×5): qty 2

## 2021-12-23 MED ORDER — MORPHINE SULFATE (PF) 2 MG/ML IV SOLN: 2.0000 mg | INTRAVENOUS | Status: DC | PRN

## 2021-12-23 MED ORDER — ALBUTEROL SULFATE (2.5 MG/3ML) 0.083% IN NEBU: 2.5000 mg | INHALATION_SOLUTION | RESPIRATORY_TRACT | Status: DC | PRN

## 2021-12-23 MED ORDER — ALPRAZOLAM 0.5 MG PO TABS
0.5000 mg | ORAL_TABLET | Freq: Every day | ORAL | Status: DC
  Administered 2021-12-23 – 2021-12-24 (×2): 0.5 mg via ORAL
  Filled 2021-12-23 (×2): qty 1

## 2021-12-23 MED ORDER — HYDRALAZINE HCL 25 MG PO TABS
25.0000 mg | ORAL_TABLET | Freq: Three times a day (TID) | ORAL | Status: DC
  Filled 2021-12-23: qty 1

## 2021-12-23 MED ORDER — DOXYCYCLINE HYCLATE 100 MG PO TABS
200.0000 mg | ORAL_TABLET | Freq: Once | ORAL | Status: AC
Start: 2021-12-23 — End: 2021-12-23
  Administered 2021-12-23: 200 mg via ORAL
  Filled 2021-12-23: qty 2

## 2021-12-23 MED ORDER — ACETAMINOPHEN 325 MG PO TABS: 650.0000 mg | ORAL_TABLET | Freq: Four times a day (QID) | ORAL | Status: DC | PRN

## 2021-12-23 MED ORDER — SPIRONOLACTONE 12.5 MG HALF TABLET
12.5000 mg | ORAL_TABLET | Freq: Every day | ORAL | Status: DC
  Administered 2021-12-23 – 2021-12-24 (×2): 12.5 mg via ORAL
  Filled 2021-12-23 (×2): qty 1

## 2021-12-23 MED ORDER — DAPAGLIFLOZIN PROPANEDIOL 10 MG PO TABS
10.0000 mg | ORAL_TABLET | Freq: Every day | ORAL | Status: DC
  Filled 2021-12-23 (×2): qty 1

## 2021-12-23 MED ORDER — SACUBITRIL-VALSARTAN 97-103 MG PO TABS
1.0000 | ORAL_TABLET | Freq: Two times a day (BID) | ORAL | Status: DC
Start: 2021-12-23 — End: 2021-12-25
  Administered 2021-12-23 – 2021-12-25 (×5): 1 via ORAL
  Filled 2021-12-23 (×7): qty 1

## 2021-12-23 MED ORDER — OXYCODONE HCL 5 MG PO TABS: 5.0000 mg | ORAL_TABLET | ORAL | Status: DC | PRN

## 2021-12-23 MED ORDER — FUROSEMIDE 10 MG/ML IJ SOLN
60.0000 mg | Freq: Every day | INTRAMUSCULAR | Status: DC
  Administered 2021-12-23: 60 mg via INTRAVENOUS
  Filled 2021-12-23: qty 6

## 2021-12-23 MED ORDER — ONDANSETRON HCL 4 MG PO TABS: 4.0000 mg | ORAL_TABLET | Freq: Four times a day (QID) | ORAL | Status: DC | PRN

## 2021-12-23 MED ORDER — ACETAMINOPHEN 650 MG RE SUPP: 650.0000 mg | Freq: Four times a day (QID) | RECTAL | Status: DC | PRN

## 2021-12-23 NOTE — Consult Note (Signed)
Cardiology Consultation:   Patient ID: THEON SOBOTKA MRN: [id number]; DOB: Aug 24, 1980  Admit date: 12/22/2021 Date of Consult: 12/23/2021  PCP:  Sharilyn Sites, MD   [facility] Cardiologist:  Glori Bickers, MD  EP: Dr. Lovena Le  Patient Profile:   GEORDIE NOONEY is a 41 y.o. male with a hx of HFrEF (EF 40-45% in 03/2017, at 45% in 09/2019 and 30-35% in 04/2021), severe AI (s/p AV repair in 04/2017 with complex valvuloplasty including plication of left coronary leaflet and aortic ring annuloplasty), moderate to severe MR (by echo in 04/2021), normal cors by cath in 2012 and 04/2017, HTN and Stage 2-3 CKD who is being seen 12/23/2021 for the evaluation of chest pain and CHF at the request of Dr. Clearence Ped.  History of Present Illness:   Mr. Gaymon has been followed by the [facility] and was last evaluated in 10/2021 and reported worsening lower extremity edema since being started on Farxiga. Weight was stable at 219 lbs and he was continued on Farxiga '10mg'$  daily, Lasix '80mg'$  daily, Coreg '25mg'$  BID, Entresto 97-'103mg'$  BID and Hydralazine '25mg'$  TID. Was previously intolerant to Imdur secondary to headaches and intolerant to Spironolactone secondary to hyperkalemia. Was recommended he have a repeat echo and consider an ICD if EF remained reduced.    He presented to [facility] ED on 12/24/2021 for evaluation of chest pain. In talking with the patient today, he reports that starting on Sunday night he developed a productive cough with clear and yellow phlegm intermittently.  He coughed throughout the day yesterday and reports his chest would hurt with the frequent coughing, therefore he decided to seek evaluation. Says that he has been more short of breath with exertion but denies specific orthopnea, PND, abdominal distention or lower extremity edema. Reports good compliance with his medication regimen and has not missed any doses of Lasix, Coreg, Entresto or  Hydralazine.  Says he did run out of [place] approximately a week ago and has been concerned about being on the medication as he felt like this caused worsening lower extremity edema upon starting the medicine. In regards to his diet, he reports consuming fast food frequently as he works full-time, is currently in school for sports management and is also the father of one year old twins. He does not weigh himself daily but says his baseline is between 215 - 218 lbs.  Initial labs showed WBC 8.3, Hgb 14.2, platelets 166, Na+ 135, K+ 4.0 creatinine 2.11 (baseline 1.5 - 1.6). BNP 3079. Initial Hs Troponin 50 with repeat values of 49 and 42. CXR showed cardiomegaly with no active disease. EKG shows NSR, HR 95 with PAC's and PVC's along with LVH and associated repol abnormalities which are similar to prior tracings.   He received IV Lasix '60mg'$  on admission. Repeat BMET this AM shows his creatinine has improved to 1.54.   Past Medical History:  Diagnosis Date   Anxiety    CHF (congestive heart failure) (HCC)    Chronic systolic heart failure (HCC)    CKD (chronic kidney disease) stage 2, GFR 60-89 ml/min    Dyspnea    with value issues- "if I dont take my medication"   Essential hypertension, benign    Headache    History of pneumonia    Mitral regurgitation    Moderate   Noncompliance    Nonischemic cardiomyopathy ([place])    Normal coronaries May 2012, LVEF < 20%   Pneumonia 2011   S/P aortic valve  repair 05/11/2017   Complex valvuloplasty including plication of left coronary leaflet and 31m Biostable HAART ring annuloplasty    Past Surgical History:  Procedure Laterality Date   AORTIC VALVE REPAIR N/A 05/11/2017   Procedure: AORTIC VALVE REPAIR;  Surgeon: ORexene Alberts MD;  [facility]  Service: Open Heart Surgery;  Laterality: N/A;   CARDIAC SURGERY     RIGHT/LEFT HEART CATH AND CORONARY ANGIOGRAPHY N/A 04/20/2017   Procedure: RIGHT/LEFT HEART CATH AND CORONARY ANGIOGRAPHY;   Surgeon: BJolaine Artist MD;  [facility];  Service: Cardiovascular;  Laterality: N/A;   SURGERY SCROTAL / TESTICULAR     Testicular torsion   TEE WITHOUT CARDIOVERSION N/A 04/09/2017   Procedure: TRANSESOPHAGEAL ECHOCARDIOGRAM (TEE);  Surgeon: BJolaine Artist MD;  [facility];  Service: Cardiovascular;  Laterality: N/A;   TEE WITHOUT CARDIOVERSION N/A 05/11/2017   Procedure: TRANSESOPHAGEAL ECHOCARDIOGRAM (TEE);  Surgeon: ORexene Alberts MD;  [facility]  Service: Open Heart Surgery;  Laterality: N/A;     Home Medications:  Prior to Admission medications   Medication Sig Start Date End Date Taking? Authorizing Provider  acetaminophen (TYLENOL) 500 MG tablet Take 1,000 mg by mouth every 6 (six) hours as needed for moderate pain or headache.    Yes [provider]  albuterol (VENTOLIN HFA) 108 (90 Base) MCG/ACT inhaler Inhale 2 puffs into the lungs every 6 (six) hours as needed for wheezing or shortness of breath. 08/19/21  Yes Milford, JMaricela Bo FNP  allopurinol (ZYLOPRIM) 300 MG tablet Take 1 tablet (300 mg total) by mouth daily. 06/12/21  Yes KSanjuana Kava MD  ALPRAZolam (Duanne Moron 0.5 MG tablet Take 0.5 mg by mouth at bedtime. 09/18/20  Yes [provider]  carvedilol (COREG) 25 MG tablet TAKE 1 TABLET BY MOUTH TWICE DAILY WITH MEALS 11/28/21  Yes Bensimhon, DShaune Pascal MD  dapagliflozin propanediol (FARXIGA) 10 MG TABS tablet Take 1 tablet (10 mg total) by mouth daily before breakfast. 08/19/21  Yes [place], JHolt FNorth [place] ENTRESTO 97-103 MG Take 1 tablet by mouth twice daily 11/24/21  Yes Bensimhon, DShaune Pascal MD  furosemide (LASIX) 80 MG tablet Take 1 tablet by mouth once daily 11/24/21  Yes Bensimhon, DShaune Pascal MD  hydrALAZINE (APRESOLINE) 25 MG tablet TAKE 1 TABLET BY MOUTH THREE TIMES DAILY 11/28/21  Yes Bensimhon, DShaune Pascal MD  potassium chloride SA (KLOR-CON M) 20 MEQ tablet Take 1 tablet by mouth once daily 06/13/21  Yes Bensimhon,  DShaune Pascal MD  colchicine 0.6 MG tablet Take 1 tablet (0.6 mg total) by mouth 3 (three) times daily for 5 days. Patient not taking: Reported on 12/22/2021 12/03/21 12/08/21  CMordecai Rasmussen MD  predniSONE (STERAPRED UNI-PAK 21 TAB) 10 MG (21) TBPK tablet 10 mg DS 12 as directed Patient not taking: Reported on 12/22/2021 12/03/21   CMordecai Rasmussen MD    Inpatient Medications: Scheduled Meds:  ALPRAZolam  0.5 mg Oral QHS   carvedilol  25 mg Oral BID WC   doxycycline  200 mg Oral Once   furosemide  60 mg Intravenous BID   heparin  5,000 Units Subcutaneous Q8H   sacubitril-valsartan  1 tablet Oral BID   Continuous Infusions:  PRN Meds: acetaminophen **OR** acetaminophen, albuterol, morphine injection, ondansetron **OR** ondansetron (ZOFRAN) IV, oxyCODONE  Allergies:   No Known Allergies  Social History:   Social History   Socioeconomic History   Marital status: Married    Spouse name: RaShannon   Number  of children: 0   Years of education: Not on file   Highest education level: Some college, no degree  Occupational History    Employer: [employer]  Tobacco Use   Smoking status: Never   Smokeless tobacco: Never  Vaping Use   Vaping Use: Never used  Substance and Sexual Activity   Alcohol use: Yes    Alcohol/week: 0.0 standard drinks of alcohol    Comment: occasional   Drug use: No   Sexual activity: Yes    Birth control/protection: Condom  Other Topics Concern   Not on file  Social History Narrative   Occupation: just started sanitation job cleaning      Patient is right-handed. He lives with his wife in a 1 story house. He drinks 1-2 sodas a day.    Social Determinants of Health   Financial Resource Strain: Not on file  Food Insecurity: Not on file  Transportation Needs: No Transportation Needs (09/28/2019)   PRAPARE - Hydrologist (Medical): No    Lack of Transportation (Non-Medical): No  Physical Activity: Insufficiently Active  (09/28/2019)   Exercise Vital Sign    Days of Exercise per Week: 4 days    Minutes of Exercise per Session: 30 min  Stress: Not on file  Social Connections: Not on file  Intimate Partner Violence: Not on file    Family History:    Family History  Problem Relation Age of Onset   Breast cancer Mother    Stroke Father    Hypertension Father      ROS:  Please see the history of present illness.   All other ROS reviewed and negative.     Physical Exam/Data:   Vitals:   12/23/21 0000 12/23/21 0022 12/23/21 0039 12/23/21 0616  BP: 109/70  110/66 105/70  Pulse: 84 81 83 76  Resp: 17 (!) '22 18 18  '$ Temp: 98.1 F (36.7 C)  98.2 F (36.8 C) 98.2 F (36.8 C)  TempSrc: Oral  Oral Oral  SpO2: 97% 97% 93% 97%  Weight:   99.2 kg 96.6 kg  Height:   '5\' 10"'$  (1.778 m)     Intake/Output Summary (Last 24 hours) at 12/23/2021 0847 Last data filed at 12/23/2021 0636 Gross per 24 hour  Intake 240 ml  Output 1400 ml  Net -1160 ml      12/23/2021    6:16 AM 12/23/2021   12:39 AM 12/22/2021    6:45 PM  Last 3 Weights  Weight (lbs) 213 lb 218 lb 11.2 oz 218 lb 14.7 oz  Weight (kg) 96.616 kg 99.202 kg 99.3 kg     Body mass index is 30.56 kg/m.  General: Pleasant male appearing in no acute distress. HEENT: normal Neck: JVD at 10-11 cm.  Vascular: No carotid bruits; Distal pulses 2+ bilaterally Cardiac:  normal S1, S2; RRR; 2/6 systolic murmur along RUSB and Apex.  Lungs: rales along bases bilaterally.  Abd: soft, nontender, no hepatomegaly  Ext: no pitting edema Musculoskeletal:  No deformities, BUE and BLE strength normal and equal Skin: warm and dry  Neuro:  CNs 2-12 intact, no focal abnormalities noted Psych:  Normal affect   EKG:  The EKG was personally reviewed and demonstrates: NSR, HR 95 with PAC's and PVC's along with LVH and associated repol abnormalities which are similar to prior tracings.  Telemetry:  Telemetry was personally reviewed and demonstrates: NSR, HR in 70's to  80's. Occasional PVC's.   Relevant CV Studies:  R/LHC:  04/2017 Findings:   Ao = 125/75 (97) LV = 96/10 RA = 4 RV = 32/7 PA = 31/17 (23) PCW = 16 Fick cardiac output/index = 4.8/2.2 PVR = 1.5 WU Ao sat = 99% PA sat = 69%, 71%   Assessment: 1. Normal coronaries with left dominant system 2. Severe AI and dilated aortic root by echo 3. Well compensated filling pressures   Plan/Discussion:   For possible aortic valve repair with Dr. Roxy Manns.     Echo: 04/29/2021 IMPRESSIONS     1. Left ventricular ejection fraction, by estimation, is 30 to 35%. The  left ventricle has moderately decreased function. The left ventricle has  no regional wall motion abnormalities. The left ventricular internal  cavity size was moderately dilated.  Left ventricular diastolic parameters are consistent with Grade II  diastolic dysfunction (pseudonormalization).   2. Right ventricular systolic function is normal. The right ventricular  size is normal.   3. Left atrial size was severely dilated.   4. Right atrial size was mild to moderately dilated.   5. The mitral valve is normal in structure. Moderate to severe mitral  valve regurgitation. No evidence of mitral stenosis.   6. Aortic valve repair with biostable HAART aortic ring annuloplasty. The  aortic valve has been repaired/replaced. Aortic valve regurgitation is  mild to moderate. No aortic stenosis is present. There is a 19 mm valve  present in the aortic position.  Procedure Date: 05/11/17. Aortic regurgitation PHT measures 316 msec.  Aortic valve area, by VTI measures 1.46 cm. Aortic valve mean gradient  measures 22.0 mmHg. Aortic valve Vmax measures 3.03 m/s.   7. Aortic dilatation noted. There is moderate dilatation of the ascending  aorta, measuring 47 mm.   8. The inferior vena cava is normal in size with greater than 50%  respiratory variability, suggesting right atrial pressure of 3 mmHg.   Comparison(s): EF and MR worse since  previous. Ascending Ao dilation  stable.   Laboratory Data:  High Sensitivity Troponin:   Recent Labs  Lab 12/22/21 1908 12/22/21 2100 12/23/21 0446  TROPONINIHS 50* 49* 42*     Chemistry Recent Labs  Lab 12/22/21 1908 12/23/21 0446  NA 135 139  K 4.0 4.3  CL 107 112*  CO2 22 22  GLUCOSE 111* 100*  BUN 26* 24*  CREATININE 2.11* 1.54*  CALCIUM 8.4* 8.1*  MG  --  2.3  GFRNONAA 40* 58*  ANIONGAP 6 5    Recent Labs  Lab 12/23/21 0446  PROT 6.0*  ALBUMIN 3.2*  AST 14*  ALT 15  ALKPHOS 52  BILITOT 1.1   Lipids No results for input(s): "CHOL", "TRIG", "HDL", "LABVLDL", "LDLCALC", "CHOLHDL" in the last 168 hours.  Hematology Recent Labs  Lab 12/22/21 1908 12/23/21 0446  WBC 8.3 6.7  RBC 5.11 4.96  HGB 14.2 13.7  HCT 43.2 41.7  MCV 84.5 84.1  MCH 27.8 27.6  MCHC 32.9 32.9  RDW 19.1* 19.0*  PLT 166 160   Thyroid No results for input(s): "TSH", "FREET4" in the last 168 hours.  BNP Recent Labs  Lab 12/22/21 1908  BNP 3,079.0*    DDimer No results for input(s): "DDIMER" in the last 168 hours.   Radiology/Studies:  DG Chest 2 View  Result Date: 12/22/2021 CLINICAL DATA:  Chest pain, shortness of breath, cough EXAM: CHEST - 2 VIEW COMPARISON:  08/11/2021 FINDINGS: Prior median sternotomy. Cardiomegaly. No confluent opacities, effusions or edema. No acute bony abnormality. IMPRESSION: Cardiomegaly.  No active disease.  Electronically Signed   By: Rolm Baptise M.D.   On: 12/22/2021 19:27     Assessment and Plan:   1. Acute HFrEF - He has a known cardiomyopathy with EF at 30-35% by most recent echo in 04/2021. Presents with a worsening productive cough and dyspnea on exertion for the past 2 days in the setting of dietary noncompliance as he has been consuming a significant mount of sodium in the form of fast food. - BNP elevated to 3079 on admission and while CXR showed no acute findings, he does have rales on examination. Repeat echo is pending.  - He  received IV Lasix '60mg'$  while in the ED. Will order '60mg'$  BID. Follow I&O's along with daily weights. Will ask for a ReDS vest reading to be obtained. Continue PTA Coreg '25mg'$  BID and Entresto 97-'103mg'$  BID. Will hold Hydralazine for now given his softer BP (at 92/53 - 123/67 since admission). Can likely add back once on PO diuretics. He has been intolerant to Imdur due to headaches and is not on Spironolactone given hyperkalemia with this in the past. He has been without [place] for the past week and feels like this was causing lower extremity edema in the past. Doubt this would be the culprit given its diuretic effect but he reports symptoms started after initiation of this. Could consider switching to Jardiance.   2. Pleuritic Chest Pain/Elevated Troponin Values - His chest pain started after having frequent coughing and was exacerbated with coughing. No association with exertion. Symptoms have now resolved.  - Hs Troponin values have been flat at 50, 49 and 42 which is most consistent with demand ischemia in the setting of his CHF exacerbation as compared to ACS. Repeat echo pending.  - Given his atypical symptoms and normal cors by cath in 04/2017, would not anticipate repeat ischemic evaluation at this time.   3. Severe AI - He is s/p AV repair in 04/2017 with complex valvuloplasty including plication of left coronary leaflet and aortic ring annuloplasty. Echo in 04/2021 showed mild to moderate aortic valve regurgitation. Repeat echocardiogram pending.  4. Mitral Regurgitation - This was moderate to severe by echo in 04/2021. Repeat echo pending. If this has progressed, may need a TEE for additional evaluation.   5. AKI on Stage 2-3 CKD - Baseline creatinine 1.5  -1.6. Elevated to 2.11 on admission and improved to 1.54 this AM with diuresis. Repeat BMET in AM.   6. Tick Bite - He has removed two deer ticks from his body since admission and one was attached. Will order a prophylactic dose of  Doxycycline '200mg'$  x1 for Lyme disease since the tick has been removed within the past 72 hours.    Risk Assessment/Risk Scores:       New York Heart Association (NYHA) Functional Class NYHA Class III   For questions or updates, please contact [facility] Please consult [email or web address] for contact info under    Signed, Erma Heritage, PA-C  12/23/2021 8:47 AM

## 2021-12-23 NOTE — Assessment & Plan Note (Signed)
Troponin is elevated at 50 and 49 - Cycle 1 more troponin with a.m. labs - Chest pain currently resolved - EKG shows no acute ischemic changes - Echo in the a.m. - Inpatient consult to cardiology

## 2021-12-23 NOTE — Assessment & Plan Note (Signed)
-   History of CHF with an ejection fraction of 30-35% in October 2022 - No peripheral edema, no oxygen requirement, no dyspnea - Chest x-ray shows cardiomegaly - Continue Lasix, Entresto, Coreg - [employer] as patient believes he has been more symptomatic since starting it -Cardiology consulted from the ED and recommends repeat echo in the a.m. and inpatient cardiology consult-orders placed

## 2021-12-23 NOTE — Assessment & Plan Note (Addendum)
--   appreciate cardiology input -- follow up repeated TTE -- continue diuresis as ordered -- further recommendations to follow

## 2021-12-23 NOTE — H&P (Signed)
History and Physical    Patient: [name] [name] DOB: October 02, 1980 DOA: 12/22/2021 DOS: the patient was seen and examined on 12/23/2021 PCP: Sharilyn Sites, MD  Patient coming from: Home  Chief Complaint:  Chief Complaint  Patient presents with   Chest Pain   HPI: [name] [name] is a 41 y.o. male with medical history significant of anxiety, CHF, CKD stage II, hypertension, nonischemic cardiomyopathy, aortic valve repair, and more presents to ED with a chief complaint of chest pain.  Patient reports that the symptoms initially started with cough on the 11th.  Around 4 PM on the 11th patient started experiencing a cough productive of clear and sometimes yellow sputum.  He denies any fever.  He did have 1 episode of posttussis emesis.  On the 12th at 4 PM he developed chest pain.  It happened while he was loading the dishwasher.  Mostly only associated to when he coughs.  He describes the pain as a tightness and a squeezing.  There is no sharp or burning sensation.  The pain is located on the left side of his chest.  There is no radiation to his jaw or arm.  He had no palpitations.  Patient has never had a heart attack.  He has had bronchitis and his symptoms today feel similar to when he had bronchitis in the past.  He did hear himself wheezing today.  He has no history of asthma.  He had an inhaler left over from when he had bronchitis, and attempted to use it but it did not help his symptoms today.  Patient has no personal or family history of premature MI.  Patient reports no peripheral edema, but reports that he usually does not get peripheral edema.  Since starting [place] he has had more peripheral edema, but is still rare.  Patient has no other complaints at this time  Patient does not smoke, he drinks about once per year, he does not use illicit drugs, and he is not vaccinated for COVID.  Patient is full code. Review of Systems: As mentioned in the history of present illness.  All other systems reviewed and are negative. Past Medical History:  Diagnosis Date   Anxiety    CHF (congestive heart failure) (HCC)    Chronic systolic heart failure (HCC)    CKD (chronic kidney disease) stage 2, GFR 60-89 ml/min    Dyspnea    with value issues- "if I dont take my medication"   Essential hypertension, benign    Headache    History of pneumonia    Mitral regurgitation    Moderate   Noncompliance    Nonischemic cardiomyopathy ([place])    Normal coronaries May 2012, LVEF < 20%   Pneumonia 2011   S/P aortic valve repair 05/11/2017   Complex valvuloplasty including plication of left coronary leaflet and 43m Biostable HAART ring annuloplasty   Past Surgical History:  Procedure Laterality Date   AORTIC VALVE REPAIR N/A 05/11/2017   Procedure: AORTIC VALVE REPAIR;  Surgeon: ORexene Alberts MD;  [facility]  Service: Open Heart Surgery;  Laterality: N/A;   CARDIAC SURGERY     RIGHT/LEFT HEART CATH AND CORONARY ANGIOGRAPHY N/A 04/20/2017   Procedure: RIGHT/LEFT HEART CATH AND CORONARY ANGIOGRAPHY;  Surgeon: BJolaine Artist MD;  [facility];  Service: Cardiovascular;  Laterality: N/A;   SURGERY SCROTAL / TESTICULAR     Testicular torsion   TEE WITHOUT CARDIOVERSION N/A 04/09/2017   Procedure: TRANSESOPHAGEAL ECHOCARDIOGRAM (  TEE);  Surgeon: Jolaine Artist, MD;  Location: [facility];  Service: Cardiovascular;  Laterality: N/A;   TEE WITHOUT CARDIOVERSION N/A 05/11/2017   Procedure: TRANSESOPHAGEAL ECHOCARDIOGRAM (TEE);  Surgeon: Rexene Alberts, MD;  Location: [facility];  Service: Open Heart Surgery;  Laterality: N/A;   Social History:  reports that he has never smoked. He has never used smokeless tobacco. He reports current alcohol use. He reports that he does not use drugs.  No Known Allergies  Family History  Problem Relation Age of Onset   Breast cancer Mother    Stroke Father    Hypertension Father     Prior to Admission medications    Medication Sig Start Date End Date Taking? Authorizing Provider  acetaminophen (TYLENOL) 500 MG tablet Take 1,000 mg by mouth every 6 (six) hours as needed for moderate pain or headache.    Yes [provider]  albuterol (VENTOLIN HFA) 108 (90 Base) MCG/ACT inhaler Inhale 2 puffs into the lungs every 6 (six) hours as needed for wheezing or shortness of breath. 08/19/21  Yes [place], Maricela Bo, FNP  allopurinol (ZYLOPRIM) 300 MG tablet Take 1 tablet (300 mg total) by mouth daily. 06/12/21  Yes Sanjuana Kava, MD  ALPRAZolam Duanne Moron) 0.5 MG tablet Take 0.5 mg by mouth at bedtime. 09/18/20  Yes [provider]  carvedilol (COREG) 25 MG tablet TAKE 1 TABLET BY MOUTH TWICE DAILY WITH MEALS 11/28/21  Yes Bensimhon, Shaune Pascal, MD  dapagliflozin propanediol (FARXIGA) 10 MG TABS tablet Take 1 tablet (10 mg total) by mouth daily before breakfast. 08/19/21  Yes [place], [place], [place]  ENTRESTO 97-103 MG Take 1 tablet by mouth twice daily 11/24/21  Yes Bensimhon, Shaune Pascal, MD  furosemide (LASIX) 80 MG tablet Take 1 tablet by mouth once daily 11/24/21  Yes Bensimhon, Shaune Pascal, MD  hydrALAZINE (APRESOLINE) 25 MG tablet TAKE 1 TABLET BY MOUTH THREE TIMES DAILY 11/28/21  Yes Bensimhon, Shaune Pascal, MD  potassium chloride SA (KLOR-CON M) 20 MEQ tablet Take 1 tablet by mouth once daily 06/13/21  Yes Bensimhon, Shaune Pascal, MD  colchicine 0.6 MG tablet Take 1 tablet (0.6 mg total) by mouth 3 (three) times daily for 5 days. Patient not taking: Reported on 12/22/2021 12/03/21 12/08/21  Mordecai Rasmussen, MD  predniSONE (STERAPRED UNI-PAK 21 TAB) 10 MG (21) TBPK tablet 10 mg DS 12 as directed Patient not taking: Reported on 12/22/2021 12/03/21   Mordecai Rasmussen, MD    Physical Exam: Vitals:   12/22/21 2330 12/23/21 0000 12/23/21 0022 12/23/21 0039  BP: 104/71 109/70  110/66  Pulse: 74 84 81 83  Resp: 19 17 (!) 22 18  Temp:  98.1 F (36.7 C)  98.2 F (36.8 C)  TempSrc:  Oral  Oral  SpO2: 98% 97% 97% 93%  Weight:     99.2 kg  Height:    '5\' 10"'$  (1.778 m)   1.  General: Patient lying supine in bed,  no acute distress   2. Psychiatric: Alert and oriented x 3, mood and behavior normal for situation, pleasant and cooperative with exam   3. Neurologic: Speech and language are normal, face is symmetric, moves all 4 extremities voluntarily, at baseline without acute deficits on limited exam   4. HEENMT:  Head is atraumatic, normocephalic, pupils reactive to light, neck is supple, trachea is midline, mucous membranes are moist   5. Respiratory : Lungs are clear to auscultation bilaterally without wheezing, rhonchi, rales, no cyanosis, no increase in  work of breathing or accessory muscle use   6. Cardiovascular : Heart rate normal, rhythm is regular, murmur present, rubs or gallops, no peripheral edema, peripheral pulses palpated   7. Gastrointestinal:  Abdomen is soft, nondistended, nontender to palpation bowel sounds active, no masses or organomegaly palpated   8. Skin:  Skin is warm, dry and intact without rashes, acute lesions, or ulcers on limited exam   9.Musculoskeletal:  No acute deformities or trauma, no asymmetry in tone, no peripheral edema, peripheral pulses palpated, no tenderness to palpation in the extremities  Data Reviewed: In the ED Temp 98.2, heart rate 45-94, respiratory rate 16-28, blood pressure 95/52, satting at 98% on room air No leukocytosis with a white blood cell count of 8.3, hemoglobin 14.2, platelets 166 Chemistry panel reveals an elevated creatinine at 2.11 BNP is elevated from his baseline at 3079, baseline seems to be around 2000, about a month ago his BNP was 4000 Troponins are mildly elevated at 50, 49 EKG shows a heart rate of 78, sinus rhythm, QTc 470 with a left bundle Chest x-ray shows cardiomegaly Patient was given Tylenol Tessalon and 500 mL bolus Cardiology was consulted and recommends hobs overnight with repeat echo in the a.m. and inpatient consult to  cardiology  Assessment and Plan: * Chest pain - Chest pain with noncardiac features including worse with cough, no radiation, nonexertional - Troponin mildly elevated 50, 49 - Cycle another troponin with a.m. labs - EKG shows no acute ischemic changes - Chest x-ray shows cardiomegaly - Patient given Tylenol, Tessalon in the ED - Currently chest pain-free - Cards consult as per the assessment for CHF  Elevated troponin Troponin is elevated at 50 and 49 - Cycle 1 more troponin with a.m. labs - Chest pain currently resolved - EKG shows no acute ischemic changes - Echo in the a.m. - Inpatient consult to cardiology  CHF (congestive heart failure) ([place]) - History of CHF with an ejection fraction of 30-35% in October 2022 - No peripheral edema, no oxygen requirement, no dyspnea - Chest x-ray shows cardiomegaly - Continue Lasix, Entresto, Coreg - [employer] as patient believes he has been more symptomatic since starting it -Cardiology consulted from the ED and recommends repeat echo in the a.m. and inpatient cardiology consult-orders placed  HTN (hypertension) - Continue hydralazine and Coreg - Continue to monitor  AKI (acute kidney injury) ([place]) - Creatinine baseline 1.62, creatinine today 2.11 - Likely cardiorenal syndrome - Patient was given fluid in the ED, but I think he is more likely to have congestion-start Lasix - Trend in the a.m.      Advance Care Planning:   Code Status: Full Code   Consults: Cardiology  Family Communication: No family at bedside  Severity of Illness: The appropriate patient status for this patient is OBSERVATION. Observation status is judged to be reasonable and necessary in order to provide the required intensity of service to ensure the patient's safety. The patient's presenting symptoms, physical exam findings, and initial radiographic and laboratory data in the context of their medical condition is felt to place them at decreased risk  for further clinical deterioration. Furthermore, it is anticipated that the patient will be medically stable for discharge from the hospital within 2 midnights of admission.   Author: Rolla Plate, DO 12/23/2021 5:02 AM  For on call review [email or web address].

## 2021-12-23 NOTE — Progress Notes (Signed)
ASSUMPTION OF CARE NOTE   12/23/2021 6:02 PM  [name] [name] was seen and examined.  The H&P by the admitting provider, orders, imaging was reviewed.  Please see new orders.  Will continue to follow.   Assessment and Plan: * Chest pain - Chest pain with noncardiac features including worse with cough, no radiation, nonexertional - Troponin mildly elevated 50, 49 - Cycle another troponin with a.m. labs - EKG shows no acute ischemic changes - Chest x-ray shows cardiomegaly - Patient given Tylenol, Tessalon in the ED - Currently chest pain-free - Cards consult as per the assessment for CHF  Acute heart failure reduced EF  -- appreciate cardiology input -- follow up repeated TTE -- continue diuresis as ordered -- further recommendations to follow  CHF (congestive heart failure) (HCC) - History of CHF with an ejection fraction of 30-35% in October 2022 - No peripheral edema, no oxygen requirement, no dyspnea - Chest x-ray shows cardiomegaly - Continue Lasix, Entresto, Coreg - [employer] as patient believes he has been more symptomatic since starting it -Cardiology consulted from the ED and recommends repeat echo in the a.m. and inpatient cardiology consult-orders placed  HTN (hypertension) - Continue hydralazine and Coreg - Continue to monitor  Elevated troponin Troponin is elevated at 50 and 49 - Cycle 1 more troponin with a.m. labs - Chest pain currently resolved - EKG shows no acute ischemic changes - Echo in the a.m. - Inpatient consult to cardiology  AKI (acute kidney injury) ([place]) - Creatinine baseline 1.62, creatinine today 2.11 - Likely cardiorenal syndrome - Patient was given fluid in the ED, but I think he is more likely to have congestion-start Lasix - Trend in the a.m.    Vitals:   12/23/21 0945 12/23/21 1431  BP: (!) 106/56 102/63  Pulse: 95 85  Resp:    Temp:  98.7 F (37.1 C)  SpO2:  98%    Results for orders placed or performed during the  hospital encounter of 12/22/21  SARS Coronavirus 2 by RT PCR (hospital order, performed in [place] hospital lab) *cepheid single result test* Anterior Nasal Swab   Specimen: Anterior Nasal Swab  Result Value Ref Range   SARS Coronavirus 2 by RT PCR NEGATIVE NEGATIVE  Basic metabolic panel  Result Value Ref Range   Sodium 135 135 - 145 mmol/L   Potassium 4.0 3.5 - 5.1 mmol/L   Chloride 107 98 - 111 mmol/L   CO2 22 22 - 32 mmol/L   Glucose, Bld 111 (H) 70 - 99 mg/dL   BUN 26 (H) 6 - 20 mg/dL   Creatinine, Ser 2.11 (H) 0.61 - 1.24 mg/dL   Calcium 8.4 (L) 8.9 - 10.3 mg/dL   GFR, Estimated 40 (L) >60 mL/min   Anion gap 6 5 - 15  CBC  Result Value Ref Range   WBC 8.3 4.0 - 10.5 K/uL   RBC 5.11 4.22 - 5.81 MIL/uL   Hemoglobin 14.2 13.0 - 17.0 g/dL   HCT 43.2 39.0 - 52.0 %   MCV 84.5 80.0 - 100.0 fL   MCH 27.8 26.0 - 34.0 pg   MCHC 32.9 30.0 - 36.0 g/dL   RDW 19.1 (H) 11.5 - 15.5 %   Platelets 166 150 - 400 K/uL   nRBC 0.0 0.0 - 0.2 %  Brain natriuretic peptide  Result Value Ref Range   B Natriuretic Peptide 3,079.0 (H) 0.0 - 100.0 pg/mL  HIV Antibody (routine testing w rflx)  Result Value Ref  Range   HIV Screen 4th Generation wRfx Non Reactive Non Reactive  Comprehensive metabolic panel  Result Value Ref Range   Sodium 139 135 - 145 mmol/L   Potassium 4.3 3.5 - 5.1 mmol/L   Chloride 112 (H) 98 - 111 mmol/L   CO2 22 22 - 32 mmol/L   Glucose, Bld 100 (H) 70 - 99 mg/dL   BUN 24 (H) 6 - 20 mg/dL   Creatinine, Ser 1.54 (H) 0.61 - 1.24 mg/dL   Calcium 8.1 (L) 8.9 - 10.3 mg/dL   Total Protein 6.0 (L) 6.5 - 8.1 g/dL   Albumin 3.2 (L) 3.5 - 5.0 g/dL   AST 14 (L) 15 - 41 U/L   ALT 15 0 - 44 U/L   Alkaline Phosphatase 52 38 - 126 U/L   Total Bilirubin 1.1 0.3 - 1.2 mg/dL   GFR, Estimated 58 (L) >60 mL/min   Anion gap 5 5 - 15  Magnesium  Result Value Ref Range   Magnesium 2.3 1.7 - 2.4 mg/dL  CBC with Differential/Platelet  Result Value Ref Range   WBC 6.7 4.0 - 10.5 K/uL    RBC 4.96 4.22 - 5.81 MIL/uL   Hemoglobin 13.7 13.0 - 17.0 g/dL   HCT 41.7 39.0 - 52.0 %   MCV 84.1 80.0 - 100.0 fL   MCH 27.6 26.0 - 34.0 pg   MCHC 32.9 30.0 - 36.0 g/dL   RDW 19.0 (H) 11.5 - 15.5 %   Platelets 160 150 - 400 K/uL   nRBC 0.0 0.0 - 0.2 %   Neutrophils Relative % 70 %   Neutro Abs 4.6 1.7 - 7.7 K/uL   Lymphocytes Relative 17 %   Lymphs Abs 1.2 0.7 - 4.0 K/uL   Monocytes Relative 10 %   Monocytes Absolute 0.7 0.1 - 1.0 K/uL   Eosinophils Relative 2 %   Eosinophils Absolute 0.2 0.0 - 0.5 K/uL   Basophils Relative 1 %   Basophils Absolute 0.1 0.0 - 0.1 K/uL   Immature Granulocytes 0 %   Abs Immature Granulocytes 0.02 0.00 - 0.07 K/uL  ECHOCARDIOGRAM COMPLETE  Result Value Ref Range   Weight 3,408 oz   Height 70 in   BP 106/56 mmHg   Single Plane A2C EF 21.6 %   Single Plane A4C EF 41.1 %   Calc EF 32.7 %   AR max vel 1.12 cm2   AV Area VTI 1.06 cm2   AV Mean grad 16.0 mmHg   AV Peak grad 32.6 mmHg   Ao pk vel 2.86 m/s   AV Area mean vel 1.07 cm2   MV VTI 2.07 cm2   Area-P 1/2 6.54 cm2   S' Lateral 5.70 cm   MV M vel 4.21 m/s   MV Peak grad 70.9 mmHg   P 1/2 time 363 msec   Radius 0.53 cm  Troponin I (High Sensitivity)  Result Value Ref Range   Troponin I (High Sensitivity) 50 (H) <18 ng/L  Troponin I (High Sensitivity)  Result Value Ref Range   Troponin I (High Sensitivity) 49 (H) <18 ng/L  Troponin I (High Sensitivity)  Result Value Ref Range   Troponin I (High Sensitivity) 42 (H) <18 ng/L     C. Wynetta Emery, MD [facility]   12/22/2021  6:49 PM How to contact the [facility] Attending or Consulting provider [place] or covering provider during after hours [place], for this patient?  Check the care team in [facility] and look for  a) attending/consulting [place] provider listed and b) the [place] team listed Log into [email or web address] and use [place]'s universal password to access. If you do not have the password, please contact the hospital operator. Locate the  [facility] provider you are looking for under [facility] and page to a number that you can be directly reached. If you still have difficulty reaching the provider, please page the [facility] (Director on Call) for the Hospitalists listed on amion for assistance.

## 2021-12-23 NOTE — Progress Notes (Signed)
   12/23/21 1100  ReDS Vest / Clip  Station Marker D  Ruler Value 36  ReDS Value Range 36 - 40  ReDS Actual Value 37

## 2021-12-23 NOTE — Hospital Course (Signed)
41 y.o. male with medical history significant of anxiety, CHF, CKD stage II, hypertension, nonischemic cardiomyopathy, aortic valve repair, and more presents to ED with a chief complaint of chest pain.  Patient reports that the symptoms initially started with cough on the 11th.  Around 4 PM on the 11th patient started experiencing a cough productive of clear and sometimes yellow sputum.  He denies any fever.  He did have 1 episode of posttussis emesis.  On the 12th at 4 PM he developed chest pain.  It happened while he was loading the dishwasher.  Mostly only associated to when he coughs.  He describes the pain as a tightness and a squeezing.  There is no sharp or burning sensation.  The pain is located on the left side of his chest.  There is no radiation to his jaw or arm.  He had no palpitations.  Patient has never had a heart attack.  He has had bronchitis and his symptoms today feel similar to when he had bronchitis in the past.  He did hear himself wheezing today.  He has no history of asthma.  He had an inhaler left over from when he had bronchitis, and attempted to use it but it did not help his symptoms today.  Patient has no personal or family history of premature MI.  Patient reports no peripheral edema, but reports that he usually does not get peripheral edema.  Since starting [place] he has had more peripheral edema, but is still rare.  Patient has no other complaints at this time  Patient does not smoke, he drinks about once per year, he does not use illicit drugs, and he is not vaccinated for COVID.  Patient is full code.

## 2021-12-23 NOTE — Assessment & Plan Note (Signed)
-   Creatinine baseline 1.62, creatinine today 2.11 - Likely cardiorenal syndrome - Patient was given fluid in the ED, but I think he is more likely to have congestion-start Lasix - Trend in the a.m.

## 2021-12-23 NOTE — Progress Notes (Signed)
*  PRELIMINARY RESULTS* Echocardiogram 2D Echocardiogram has been performed.  Elpidio Anis 12/23/2021, 12:49 PM

## 2021-12-23 NOTE — Plan of Care (Signed)
  Problem: Education: Goal: Knowledge of General Education information will improve Description Including pain rating scale, medication(s)/side effects and non-pharmacologic comfort measures Outcome: Progressing   Problem: Health Behavior/Discharge Planning: Goal: Ability to manage health-related needs will improve Outcome: Progressing   

## 2021-12-23 NOTE — Assessment & Plan Note (Signed)
-   Continue hydralazine and Coreg - Continue to monitor

## 2021-12-23 NOTE — TOC Initial Note (Signed)
Transition of Care [facility]) - Initial/Assessment Note    Patient Details  Name: [name] [name] MRN: [id number] Date of Birth: [birth year]  [facility]) CM/SW Contact:    Salome Arnt, LCSW Phone Number: 12/23/2021, 7:50 AM  Clinical Narrative:  Pt admitted with chest pain. TOC received consult for CHF screening. Pt reports he lives with his wife and almost 41 year old twin boys. He works full-time at [name]. Pt has transportation to appointments.   CHF screening completed. Pt indicates he was diagnosed with CHF in 2012. He has a scale at home, but weighs himself less than one time a week. When asked about following a heart healthy diet, pt states, "Not too good, not like I should." Pt said he is followed by a cardiologist and takes medications as prescribed. Discussed CHF book, but pt indicates he is well educated on recommendations and doesn't feel he needs any further information. TOC will continue to follow.                  Expected Discharge Plan: Home/Self Care Barriers to Discharge: Continued Medical Work up   Patient Goals and CMS Choice Patient states their goals for this hospitalization and ongoing recovery are:: return home and to work   Choice offered to / list presented to : Patient  Expected Discharge Plan and Services Expected Discharge Plan: Home/Self Care In-house Referral: Clinical Social Work     Living arrangements for the past 2 months: Single Family Home                                      Prior Living Arrangements/Services Living arrangements for the past 2 months: Single Family Home Lives with:: Spouse, Minor Children Patient language and need for interpreter reviewed:: Yes Do you feel safe going back to the place where you live?: Yes      Need for Family Participation in Patient Care: No (Comment)     Criminal Activity/Legal Involvement Pertinent to Current Situation/Hospitalization: No - Comment as  needed  Activities of Daily Living Home Assistive Devices/Equipment: None ADL Screening (condition at time of admission) Patient's cognitive ability adequate to safely complete daily activities?: Yes Is the patient deaf or have difficulty hearing?: No Does the patient have difficulty seeing, even when wearing glasses/contacts?: No Does the patient have difficulty concentrating, remembering, or making decisions?: No Patient able to express need for assistance with ADLs?: Yes Does the patient have difficulty dressing or bathing?: No Independently performs ADLs?: Yes (appropriate for developmental age) Does the patient have difficulty walking or climbing stairs?: Yes (becomes SOB) Weakness of Legs: None Weakness of Arms/Hands: None  Permission Sought/Granted                  Emotional Assessment     Affect (typically observed): Appropriate Orientation: : Oriented to Self, Oriented to  Time, Oriented to Place, Oriented to Situation Alcohol / Substance Use: Not Applicable Psych Involvement: No (comment)  Admission diagnosis:  AKI (acute kidney injury) ([place]) [N17.9] Chest pain [R07.9] Acute on chronic congestive heart failure, unspecified heart failure type ([place]) [I50.9] Left-sided chest pain [R07.9] Patient Active Problem List   Diagnosis Date Noted   S/P aortic valve repair 05/11/2017   Aortic valve regurgitation    Chronic systolic CHF (congestive heart failure) ([place])    Hypertensive emergency 05/30/2015   Essential hypertension 11/22/2014   Shift work sleep  disorder 10/30/2014   Obstructive sleep apnea 10/30/2014   Hypertensive crisis 09/26/2014   Hypertensive urgency 06/13/2013   CHF (congestive heart failure) ([place]) 06/13/2013   CKD (chronic kidney disease) stage 3, GFR 30-59 ml/min ([place]) 06/13/2013   Nonischemic cardiomyopathy ([place]) 06/13/2013   HTN (hypertension) 02/25/2012   Chronic combined systolic and diastolic heart failure ([place]) 01/08/2012   Elevated  troponin 01/01/2012   Hypokalemia 12/31/2011   Chest pain 12/31/2011   AKI (acute kidney injury) ([place]) 12/31/2011   PCP:  Sharilyn Sites, MD Pharmacy:   [place] [place] - [place] [address] [place] [address] Phone: [phone] Fax: [phone]     Social Determinants of Health (SDOH) Interventions    Readmission Risk Interventions     No data to display

## 2021-12-23 NOTE — Assessment & Plan Note (Signed)
-   Chest pain with noncardiac features including worse with cough, no radiation, nonexertional - Troponin mildly elevated 50, 49 - Cycle another troponin with a.m. labs - EKG shows no acute ischemic changes - Chest x-ray shows cardiomegaly - Patient given Tylenol, Tessalon in the ED - Currently chest pain-free - Cards consult as per the assessment for CHF

## 2021-12-24 DIAGNOSIS — R079 Chest pain, unspecified: Secondary | ICD-10-CM | POA: Diagnosis not present

## 2021-12-24 DIAGNOSIS — N179 Acute kidney failure, unspecified: Secondary | ICD-10-CM | POA: Diagnosis not present

## 2021-12-24 DIAGNOSIS — I10 Essential (primary) hypertension: Secondary | ICD-10-CM | POA: Diagnosis not present

## 2021-12-24 LAB — BASIC METABOLIC PANEL
Anion gap: 7 (ref 5–15)
BUN: 23 mg/dL — ABNORMAL HIGH (ref 6–20)
CO2: 21 mmol/L — ABNORMAL LOW (ref 22–32)
Calcium: 8 mg/dL — ABNORMAL LOW (ref 8.9–10.3)
Chloride: 108 mmol/L (ref 98–111)
Creatinine, Ser: 1.6 mg/dL — ABNORMAL HIGH (ref 0.61–1.24)
GFR, Estimated: 56 mL/min — ABNORMAL LOW (ref >60)
Glucose, Bld: 98 mg/dL (ref 70–99)
Potassium: 3.6 mmol/L (ref 3.5–5.1)
Sodium: 136 mmol/L (ref 135–145)

## 2021-12-24 LAB — MAGNESIUM: Magnesium: 2 mg/dL (ref 1.7–2.4)

## 2021-12-24 NOTE — Progress Notes (Signed)
   12/24/21 1851  ReDS Vest / Clip  Station Marker D  Ruler Value 35  ReDS Value Range 36 - 40  ReDS Actual Value 38

## 2021-12-24 NOTE — Progress Notes (Signed)
PROGRESS NOTE     [name] [name], is a 41 y.o. male, DOB - 1981/04/01, BHA:[id number]  Admit date - 12/22/2021   Admitting Physician Murlean Iba, MD  Outpatient Primary MD for the patient is Sharilyn Sites, MD  LOS - 1  Chief Complaint  Patient presents with   Chest Pain        Brief Narrative:   41 y.o. male with medical history significant of anxiety, CHF, CKD stage II, hypertension, nonischemic cardiomyopathy, aortic valve repair, and more presents to ED with a chief complaint of chest pain.  Patient reports that the symptoms initially started with cough on the 11th.  Around 4 PM on the 11th patient started experiencing a cough productive of clear and sometimes yellow sputum.  He denies any fever.  He did have 1 episode of posttussis emesis.  On the 12th at 4 PM he developed chest pain.  It happened while he was loading the dishwasher.  Mostly only associated to when he coughs.  He describes the pain as a tightness and a squeezing.  There is no sharp or burning sensation.  The pain is located on the left side of his chest.  There is no radiation to his jaw or arm.  He had no palpitations.  Patient has never had a heart attack.  He has had bronchitis and his symptoms today feel similar to when he had bronchitis in the past.  He did hear himself wheezing today.  He has no history of asthma.  He had an inhaler left over from when he had bronchitis, and attempted to use it but it did not help his symptoms today.  Patient has no personal or family history of premature MI.  Patient reports no peripheral edema, but reports that he usually does not get peripheral edema.  Since starting [place] he has had more peripheral edema, but is still rare.  Patient has no other complaints at this time  Patient does not smoke, he drinks about once per year, he does not use illicit drugs, and he is not vaccinated for COVID.  Patient is full code.    -Assessment and Plan: 1)*Atypical chest pain -  Atypical Chest pain was worse with cough (pleuritic), no radiation, nonexertional - Troponin mildly elevated 50 > 49>>42 - EKG shows no acute ischemic changes - Chest x-ray shows cardiomegaly -Remains chest pain-free (resolved with diuresis) -Cardiology consult appreciated -  2)HFrEF/Acute on chronic heart failure reduced EF /nonischemic cardiomyopathy diagnosed back in late 2011/early 2012 after respiratory infection/pneumonia --- Echo from 12/23/2021 with EF down to 20 to 25% from 30 to 65% previously, global hypokinesis of the left ventricle noted --Improving with IV diuresis----weight is down to 211 pounds from 219 pounds on admission  Intake/Output Summary (Last 24 hours) at 12/24/2021 1315 Last data filed at 12/24/2021 1123 Gross per 24 hour  Intake 720 ml  Output 2150 ml  Net -1430 ml  -Continue Coreg 25 mg twice daily, Lasix 60 mg IV twice daily, Entresto 97/103 twice daily -Continue to hold Farxiga and Aldactone due to kidney concerns -Dyspnea on exertion persist -Cough related to CHF persist -Cardiology service recommends against discharge on 12/24/2021 they recommend additional IV diuresis  3)AKI----acute kidney injury on CKD stage -3A  -- Likely cardiorenal syndrome  creatinine on admission=2.11  ,  baseline creatinine = 1.6 range   ,  -Creatinine is currently back to baseline, continue to watch closely with IV diuresis -renally adjust medications, avoid nephrotoxic agents / dehydration  /  hypotension  4)Severe aortic regurgitation-status post TAVR on 05/11/2017/moderate to severe aortic regurgitation--- -Echo from 12/23/2021 with evidence of prior TAVR, there is severe and eccentric aortic regurgitation, and moderate to severe mitral regurgitation -Cardiology service discussed case with Dr. Haroldine Laws who advised outpatient TEE after further management/stabilization of his current CHF exacerbation   5)Ascending Aortic Aorta aneurysm measuring 46 mm--severe aortic root  dilatation noted -Outpatient follow-up with cardiology and vascular surgery advised  Disposition/Need for in-Hospital Stay- patient unable to be discharged at this time due to --- significantly exacerbation with drop in EF, remains symptomatic with dyspnea on exertion requiring additional IV diuresis*  Status is: Inpatient   Disposition: The patient is from: Home              Anticipated d/c is to: Home              Anticipated d/c date is: 1 day              Patient currently is not medically stable to d/c. Barriers: Not Clinically Stable-   Code Status :  -  Code Status: Full Code   Family Communication:    (patient is alert, awake and coherent)  Discussed with his wife at bedside  DVT Prophylaxis  :   - SCDs  heparin injection 5,000 Units Start: 12/23/21 0600 SCDs Start: 12/23/21 0200   Lab Results  Component Value Date   PLT 160 12/23/2021    Inpatient Medications  Scheduled Meds:  ALPRAZolam  0.5 mg Oral QHS   carvedilol  25 mg Oral BID WC   furosemide  60 mg Intravenous BID   heparin  5,000 Units Subcutaneous Q8H   sacubitril-valsartan  1 tablet Oral BID   Continuous Infusions: PRN Meds:.acetaminophen **OR** acetaminophen, albuterol, morphine injection, ondansetron **OR** ondansetron (ZOFRAN) IV, oxyCODONE   Anti-infectives (From admission, onward)    Start     Dose/Rate Route Frequency Ordered Stop   12/23/21 0915  doxycycline (VIBRA-TABS) tablet 200 mg        200 mg Oral  Once 12/23/21 0825 12/23/21 0950         Subjective: [name] today has no fevers, no emesis,  No chest pain,   Wife at bedside, -Inquiring about possible discharge home today -Cough and dyspnea on exertion persist -Voiding well   Objective: Vitals:   12/23/21 1431 12/23/21 2041 12/24/21 0451 12/24/21 0500  BP: 102/63 94/61 111/68   Pulse: 85 82 85   Resp:  20 20   Temp: 98.7 F (37.1 C) (!) 97.5 F (36.4 C) (!) 97.4 F (36.3 C)   TempSrc: Oral Oral Oral   SpO2: 98%  100% 99%   Weight:    95.6 kg  Height:        Intake/Output Summary (Last 24 hours) at 12/24/2021 1257 Last data filed at 12/24/2021 1123 Gross per 24 hour  Intake 960 ml  Output 2150 ml  Net -1190 ml   Filed Weights   12/23/21 0039 12/23/21 0616 12/24/21 0500  Weight: 99.2 kg 96.6 kg 95.6 kg   Physical Exam  Gen:- Awake Alert, no conversational dyspnea HEENT:- .AT, No sclera icterus Neck-Supple Neck,No JVD,.  Lungs-fair air movement, faint bibasilar Rales  CV- S1, S2 normal, regular , 4/6 systolic murmur Abd-  +ve B.Sounds, Abd Soft, No tenderness,    Extremity/Skin:- No significant edema, pedal pulses present  Psych-affect is appropriate, oriented x3 Neuro-no new focal deficits, no tremors  Data Reviewed: I have personally reviewed following labs  and imaging studies  CBC: Recent Labs  Lab 12/22/21 1908 12/23/21 0446  WBC 8.3 6.7  NEUTROABS  --  4.6  HGB 14.2 13.7  HCT 43.2 41.7  MCV 84.5 84.1  PLT 166 017   Basic Metabolic Panel: Recent Labs  Lab 12/22/21 1908 12/23/21 0446 12/24/21 0438  NA 135 139 136  K 4.0 4.3 3.6  CL 107 112* 108  CO2 22 22 21*  GLUCOSE 111* 100* 98  BUN 26* 24* 23*  CREATININE 2.11* 1.54* 1.60*  CALCIUM 8.4* 8.1* 8.0*  MG  --  2.3 2.0   GFR: Estimated Creatinine Clearance: 71.2 mL/min (A) (by C-G formula based on SCr of 1.6 mg/dL (H)). Liver Function Tests: Recent Labs  Lab 12/23/21 0446  AST 14*  ALT 15  ALKPHOS 52  BILITOT 1.1  PROT 6.0*  ALBUMIN 3.2*   Cardiac Enzymes: No results for input(s): "CKTOTAL", "CKMB", "CKMBINDEX", "TROPONINI" in the last 168 hours. BNP (last 3 results) No results for input(s): "PROBNP" in the last 8760 hours. HbA1C: No results for input(s): "HGBA1C" in the last 72 hours. Sepsis Labs: '@LABRCNTIP'$ (procalcitonin:4,lacticidven:4) ) Recent Results (from the past 240 hour(s))  SARS Coronavirus 2 by RT PCR (hospital order, performed in [facility] hospital lab) *cepheid single result test*  Anterior Nasal Swab     Status: None   Collection Time: 12/22/21  8:19 PM   Specimen: Anterior Nasal Swab  Result Value Ref Range Status   SARS Coronavirus 2 by RT PCR NEGATIVE NEGATIVE Final    Comment: (NOTE) SARS-CoV-2 target nucleic acids are NOT DETECTED.  The SARS-CoV-2 RNA is generally detectable in upper and lower respiratory specimens during the acute phase of infection. The lowest concentration of SARS-CoV-2 viral copies this assay can detect is 250 copies / mL. A negative result does not preclude SARS-CoV-2 infection and should not be used as the sole basis for treatment or other patient management decisions.  A negative result may occur with improper specimen collection / handling, submission of specimen other than nasopharyngeal swab, presence of viral mutation(s) within the areas targeted by this assay, and inadequate number of viral copies (<250 copies / mL). A negative result must be combined with clinical observations, patient history, and epidemiological information.  Fact Sheet for Patients:   https://www.patel.info/  Fact Sheet for Healthcare Providers: https://hall.com/  This test is not yet approved or  cleared by the [place] FDA and has been authorized for detection and/or diagnosis of SARS-CoV-2 by FDA under an Emergency Use Authorization (EUA).  This EUA will remain in effect (meaning this test can be used) for the duration of the COVID-19 declaration under Section 564(b)(1) of the Act, 21 U.S.C. section 360bbb-3(b)(1), unless the authorization is terminated or revoked sooner.  Performed at [facility], [address] [place], [place] [address]       Radiology Studies: ECHOCARDIOGRAM COMPLETE  Result Date: 12/23/2021    ECHOCARDIOGRAM REPORT   Patient Name:   ANTWOINE ZORN Date of Exam: 12/23/2021 Medical Rec #:  [id number]         Height:       70.0 in Accession #:    [id number]        Weight:        213.0 lb Date of Birth:  03-Nov-1980         BSA:          2.144 m Patient Age:    22 years  BP:           105/71 mmHg Patient Gender: M                 HR:           78 bpm. Exam Location:  [facility] Procedure: 2D Echo, Cardiac Doppler and Color Doppler Indications:    Chest Pain  History:        Patient has prior history of Echocardiogram examinations, most                 recent 04/29/2021. CHF and Cardiomyopathy, Signs/Symptoms:Chest                 Pain; Risk Factors:Hypertension. S/p Aortic valve repair                 05/11/2017. HAART aortic ring.                 Aortic Valve: 19 mm valve is present in the aortic position.                 Procedure Date: 05/11/2017.  Sonographer:    Wenda Low Referring Phys: [id number] ASIA B [place]  1. Left ventricular ejection fraction, by estimation, is 20 to 25%. The left ventricle has severely decreased function. The left ventricle demonstrates global hypokinesis. The left ventricular internal cavity size was severely dilated. Indeterminate diastolic filling due to E-A fusion.  2. Right ventricular systolic function is severely reduced. The right ventricular size is moderately enlarged. There is normal pulmonary artery systolic pressure. The estimated right ventricular systolic pressure is 22.9 mmHg.  3. Left atrial size was severely dilated.  4. Right atrial size was severely dilated.  5. The mitral valve is grossly normal. Moderate to severe mitral valve regurgitation. No evidence of mitral stenosis.  6. S/p Aortic Valve Repair with 19 mm annuloplasty ring (05/11/2017). There is residual aortic regurgitation that is severe and eccentric. V max 2.9 m/s, MG 16 mmHG. Suspect this is related to AI. Recommend TEE for further characterization. The aortic valve has been repaired/replaced. Aortic valve regurgitation is severe. There is a 19 mm valve present in the aortic position. Procedure Date: 05/11/2017.  7. Aortic dilatation noted. Aneurysm  of the ascending aorta, measuring 47 mm. There is severe dilatation of the aortic root, measuring 44 mm.  8. The inferior vena cava is normal in size with <50% respiratory variability, suggesting right atrial pressure of 8 mmHg. Comparison(s): Changes from prior study are noted. EF now 20-25%. Severe AI. FINDINGS  Left Ventricle: Left ventricular ejection fraction, by estimation, is 20 to 25%. The left ventricle has severely decreased function. The left ventricle demonstrates global hypokinesis. The left ventricular internal cavity size was severely dilated. There is no concentric left ventricular hypertrophy. Indeterminate diastolic filling due to E-A fusion. Right Ventricle: The right ventricular size is moderately enlarged. No increase in right ventricular wall thickness. Right ventricular systolic function is severely reduced. There is normal pulmonary artery systolic pressure. The tricuspid regurgitant velocity is 2.50 m/s, and with an assumed right atrial pressure of 8 mmHg, the estimated right ventricular systolic pressure is 79.8 mmHg. Left Atrium: Left atrial size was severely dilated. Right Atrium: Right atrial size was severely dilated. Pericardium: There is no evidence of pericardial effusion. Mitral Valve: The mitral valve is grossly normal. Moderate to severe mitral valve regurgitation. No evidence of mitral valve stenosis. MV peak gradient, 9.1 mmHg. The mean mitral valve gradient is 2.0 mmHg.  Tricuspid Valve: The tricuspid valve is grossly normal. Tricuspid valve regurgitation is mild . No evidence of tricuspid stenosis. Aortic Valve: S/p Aortic Valve Repair with 19 mm annuloplasty ring (05/11/2017). There is residual aortic regurgitation that is severe and eccentric. V max 2.9 m/s, MG 16 mmHG. Suspect this is related to AI. Recommend TEE for further characterization. The aortic valve has been repaired/replaced. Aortic valve regurgitation is severe. Aortic regurgitation PHT measures 363 msec. Aortic  valve mean gradient measures 16.0 mmHg. Aortic valve peak gradient measures 32.6 mmHg. Aortic valve area, by VTI measures 1.06 cm. There is a 19 mm valve present in the aortic position. Procedure Date: 05/11/2017. Pulmonic Valve: The pulmonic valve was grossly normal. Pulmonic valve regurgitation is mild. No evidence of pulmonic stenosis. Aorta: Aortic dilatation noted. There is severe dilatation of the aortic root, measuring 44 mm. There is an aneurysm involving the ascending aorta measuring 47 mm. Venous: The inferior vena cava is normal in size with less than 50% respiratory variability, suggesting right atrial pressure of 8 mmHg. IAS/Shunts: The atrial septum is grossly normal.  LEFT VENTRICLE PLAX 2D LVIDd:         6.90 cm LVIDs:         5.70 cm LV PW:         1.40 cm LV IVS:        1.30 cm LVOT diam:     2.00 cm LV SV:         56 LV SV Index:   26 LVOT Area:     3.14 cm  LV Volumes (MOD) LV vol d, MOD A2C: 231.0 ml LV vol d, MOD A4C: 214.0 ml LV vol s, MOD A2C: 181.0 ml LV vol s, MOD A4C: 126.0 ml LV SV MOD A2C:     50.0 ml LV SV MOD A4C:     214.0 ml LV SV MOD BP:      75.8 ml RIGHT VENTRICLE RV Basal diam:  3.95 cm RV Mid diam:    3.10 cm RV S prime:     9.03 cm/s TAPSE (M-mode): 1.6 cm LEFT ATRIUM              Index        RIGHT ATRIUM           Index LA diam:        6.20 cm  2.89 cm/m   RA Area:     26.90 cm LA Vol (A2C):   193.0 ml 90.02 ml/m  RA Volume:   86.80 ml  40.49 ml/m LA Vol (A4C):   171.0 ml 79.76 ml/m LA Biplane Vol: 186.0 ml 86.76 ml/m  AORTIC VALVE                     PULMONIC VALVE AV Area (Vmax):    1.12 cm      PV Vmax:       0.81 m/s AV Area (Vmean):   1.07 cm      PV Peak grad:  2.6 mmHg AV Area (VTI):     1.06 cm AV Vmax:           285.67 cm/s AV Vmean:          180.333 cm/s AV VTI:            0.529 m AV Peak Grad:      32.6 mmHg AV Mean Grad:      16.0 mmHg LVOT Vmax:  102.00 cm/s LVOT Vmean:        61.200 cm/s LVOT VTI:          0.178 m LVOT/AV VTI ratio: 0.34 AI  PHT:            363 msec  AORTA Ao Root diam: 4.40 cm Ao Asc diam:  4.70 cm MITRAL VALVE                  TRICUSPID VALVE MV Area (PHT): 6.54 cm       TR Peak grad:   25.0 mmHg MV Area VTI:   2.07 cm       TR Vmax:        250.00 cm/s MV Peak grad:  9.1 mmHg MV Mean grad:  2.0 mmHg       SHUNTS MV Vmax:       1.51 m/s       Systemic VTI:  0.18 m MV Vmean:      62.5 cm/s      Systemic Diam: 2.00 cm MV Decel Time: 116 msec MR Peak grad:    70.9 mmHg MR Mean grad:    41.0 mmHg MR Vmax:         421.00 cm/s MR Vmean:        296.0 cm/s MR PISA:         1.79 cm MR PISA Eff ROA: 42 mm MR PISA Radius:  0.53 cm MV E velocity: 132.00 cm/s Eleonore Chiquito MD Electronically signed by Eleonore Chiquito MD Signature Date/Time: 12/23/2021/1:54:12 PM    Final    DG Chest 2 View  Result Date: 12/22/2021 CLINICAL DATA:  Chest pain, shortness of breath, cough EXAM: CHEST - 2 VIEW COMPARISON:  08/11/2021 FINDINGS: Prior median sternotomy. Cardiomegaly. No confluent opacities, effusions or edema. No acute bony abnormality. IMPRESSION: Cardiomegaly.  No active disease. Electronically Signed   By: Rolm Baptise M.D.   On: 12/22/2021 19:27     Scheduled Meds:  ALPRAZolam  0.5 mg Oral QHS   carvedilol  25 mg Oral BID WC   furosemide  60 mg Intravenous BID   heparin  5,000 Units Subcutaneous Q8H   sacubitril-valsartan  1 tablet Oral BID   Continuous Infusions:   LOS: 1 day    Roxan Hockey M.D on 12/24/2021 at 12:57 PM  Go to [email or web address] - for contact info  [facility] - Office  [phone]  If 7PM-7AM, please contact night-coverage [email or web address]  12/24/2021, 12:57 PM

## 2021-12-24 NOTE — Progress Notes (Addendum)
Progress Note  Patient Name: [name] [name] Date of Encounter: 12/24/2021  [facility] Cardiologist: Glori Bickers, MD   Subjective   Denies any chest pain but still has shortness of breath and cough when walking he is anxious to go home.  2D echo yesterday showed severe aortic insufficiency.  Inpatient Medications    Scheduled Meds:  ALPRAZolam  0.5 mg Oral QHS   carvedilol  25 mg Oral BID WC   furosemide  60 mg Intravenous BID   heparin  5,000 Units Subcutaneous Q8H   sacubitril-valsartan  1 tablet Oral BID   spironolactone  12.5 mg Oral Daily   Continuous Infusions:  PRN Meds: acetaminophen **OR** acetaminophen, albuterol, morphine injection, ondansetron **OR** ondansetron (ZOFRAN) IV, oxyCODONE   Vital Signs    Vitals:   12/23/21 1431 12/23/21 2041 12/24/21 0451 12/24/21 0500  BP: 102/63 94/61 111/68   Pulse: 85 82 85   Resp:  20 20   Temp: 98.7 F (37.1 C) (!) 97.5 F (36.4 C) (!) 97.4 F (36.3 C)   TempSrc: Oral Oral Oral   SpO2: 98% 100% 99%   Weight:    95.6 kg  Height:        Intake/Output Summary (Last 24 hours) at 12/24/2021 1018 Last data filed at 12/24/2021 0500 Gross per 24 hour  Intake 720 ml  Output 1550 ml  Net -830 ml      12/24/2021    5:00 AM 12/23/2021    6:16 AM 12/23/2021   12:39 AM  Last 3 Weights  Weight (lbs) 210 lb 11.2 oz 213 lb 218 lb 11.2 oz  Weight (kg) 95.573 kg 96.616 kg 99.202 kg      Telemetry    Normal sinus rhythm- Personally Reviewed  ECG    No new EKG to review- Personally Reviewed  Physical Exam   GEN: No acute distress.   Neck: No JVD Cardiac: RRR, no murmurs, rubs, or gallops.  Respiratory: Crackles at bases bilaterally GI: Soft, nontender, non-distended  MS: No edema; No deformity. Neuro:  Nonfocal  Psych: Normal affect   Labs    High Sensitivity Troponin:   Recent Labs  Lab 12/22/21 1908 12/22/21 2100 12/23/21 0446  TROPONINIHS 50* 49* 42*      Chemistry Recent Labs  Lab  12/22/21 1908 12/23/21 0446 12/24/21 0438  NA 135 139 136  K 4.0 4.3 3.6  CL 107 112* 108  CO2 22 22 21*  GLUCOSE 111* 100* 98  BUN 26* 24* 23*  CREATININE 2.11* 1.54* 1.60*  CALCIUM 8.4* 8.1* 8.0*  PROT  --  6.0*  --   ALBUMIN  --  3.2*  --   AST  --  14*  --   ALT  --  15  --   ALKPHOS  --  52  --   BILITOT  --  1.1  --   GFRNONAA 40* 58* 56*  ANIONGAP '6 5 7     '$ Hematology Recent Labs  Lab 12/22/21 1908 12/23/21 0446  WBC 8.3 6.7  RBC 5.11 4.96  HGB 14.2 13.7  HCT 43.2 41.7  MCV 84.5 84.1  MCH 27.8 27.6  MCHC 32.9 32.9  RDW 19.1* 19.0*  PLT 166 160    BNP Recent Labs  Lab 12/22/21 1908  BNP 3,079.0*     DDimer No results for input(s): "DDIMER" in the last 168 hours.   Radiology    ECHOCARDIOGRAM COMPLETE  Result Date: 12/23/2021    ECHOCARDIOGRAM REPORT  Patient Name:   [name] [name] Date of Exam: 12/23/2021 Medical Rec #:  [id number]         Height:       70.0 in Accession #:    [id number]        Weight:       213.0 lb Date of Birth:  March 25, 1981         BSA:          2.144 m Patient Age:    41 years          BP:           105/71 mmHg Patient Gender: M                 HR:           78 bpm. Exam Location:  [facility] Procedure: 2D Echo, Cardiac Doppler and Color Doppler Indications:    Chest Pain  History:        Patient has prior history of Echocardiogram examinations, most                 recent 04/29/2021. CHF and Cardiomyopathy, Signs/Symptoms:Chest                 Pain; Risk Factors:Hypertension. S/p Aortic valve repair                 05/11/2017. HAART aortic ring.                 Aortic Valve: 19 mm valve is present in the aortic position.                 Procedure Date: 05/11/2017.  Sonographer:    Wenda Low Referring Phys: [id number] ASIA B [place]  1. Left ventricular ejection fraction, by estimation, is 20 to 25%. The left ventricle has severely decreased function. The left ventricle demonstrates global hypokinesis. The left  ventricular internal cavity size was severely dilated. Indeterminate diastolic filling due to E-A fusion.  2. Right ventricular systolic function is severely reduced. The right ventricular size is moderately enlarged. There is normal pulmonary artery systolic pressure. The estimated right ventricular systolic pressure is 57.3 mmHg.  3. Left atrial size was severely dilated.  4. Right atrial size was severely dilated.  5. The mitral valve is grossly normal. Moderate to severe mitral valve regurgitation. No evidence of mitral stenosis.  6. S/p Aortic Valve Repair with 19 mm annuloplasty ring (05/11/2017). There is residual aortic regurgitation that is severe and eccentric. V max 2.9 m/s, MG 16 mmHG. Suspect this is related to AI. Recommend TEE for further characterization. The aortic valve has been repaired/replaced. Aortic valve regurgitation is severe. There is a 19 mm valve present in the aortic position. Procedure Date: 05/11/2017.  7. Aortic dilatation noted. Aneurysm of the ascending aorta, measuring 47 mm. There is severe dilatation of the aortic root, measuring 44 mm.  8. The inferior vena cava is normal in size with <50% respiratory variability, suggesting right atrial pressure of 8 mmHg. Comparison(s): Changes from prior study are noted. EF now 20-25%. Severe AI. FINDINGS  Left Ventricle: Left ventricular ejection fraction, by estimation, is 20 to 25%. The left ventricle has severely decreased function. The left ventricle demonstrates global hypokinesis. The left ventricular internal cavity size was severely dilated. There is no concentric left ventricular hypertrophy. Indeterminate diastolic filling due to E-A fusion. Right Ventricle: The right ventricular size is moderately enlarged. No increase in right ventricular wall thickness. Right  ventricular systolic function is severely reduced. There is normal pulmonary artery systolic pressure. The tricuspid regurgitant velocity is 2.50 m/s, and with an assumed  right atrial pressure of 8 mmHg, the estimated right ventricular systolic pressure is 16.1 mmHg. Left Atrium: Left atrial size was severely dilated. Right Atrium: Right atrial size was severely dilated. Pericardium: There is no evidence of pericardial effusion. Mitral Valve: The mitral valve is grossly normal. Moderate to severe mitral valve regurgitation. No evidence of mitral valve stenosis. MV peak gradient, 9.1 mmHg. The mean mitral valve gradient is 2.0 mmHg. Tricuspid Valve: The tricuspid valve is grossly normal. Tricuspid valve regurgitation is mild . No evidence of tricuspid stenosis. Aortic Valve: S/p Aortic Valve Repair with 19 mm annuloplasty ring (05/11/2017). There is residual aortic regurgitation that is severe and eccentric. V max 2.9 m/s, MG 16 mmHG. Suspect this is related to AI. Recommend TEE for further characterization. The aortic valve has been repaired/replaced. Aortic valve regurgitation is severe. Aortic regurgitation PHT measures 363 msec. Aortic valve mean gradient measures 16.0 mmHg. Aortic valve peak gradient measures 32.6 mmHg. Aortic valve area, by VTI measures 1.06 cm. There is a 19 mm valve present in the aortic position. Procedure Date: 05/11/2017. Pulmonic Valve: The pulmonic valve was grossly normal. Pulmonic valve regurgitation is mild. No evidence of pulmonic stenosis. Aorta: Aortic dilatation noted. There is severe dilatation of the aortic root, measuring 44 mm. There is an aneurysm involving the ascending aorta measuring 47 mm. Venous: The inferior vena cava is normal in size with less than 50% respiratory variability, suggesting right atrial pressure of 8 mmHg. IAS/Shunts: The atrial septum is grossly normal.  LEFT VENTRICLE PLAX 2D LVIDd:         6.90 cm LVIDs:         5.70 cm LV PW:         1.40 cm LV IVS:        1.30 cm LVOT diam:     2.00 cm LV SV:         56 LV SV Index:   26 LVOT Area:     3.14 cm  LV Volumes (MOD) LV vol d, MOD A2C: 231.0 ml LV vol d, MOD A4C: 214.0  ml LV vol s, MOD A2C: 181.0 ml LV vol s, MOD A4C: 126.0 ml LV SV MOD A2C:     50.0 ml LV SV MOD A4C:     214.0 ml LV SV MOD BP:      75.8 ml RIGHT VENTRICLE RV Basal diam:  3.95 cm RV Mid diam:    3.10 cm RV S prime:     9.03 cm/s TAPSE (M-mode): 1.6 cm LEFT ATRIUM              Index        RIGHT ATRIUM           Index LA diam:        6.20 cm  2.89 cm/m   RA Area:     26.90 cm LA Vol (A2C):   193.0 ml 90.02 ml/m  RA Volume:   86.80 ml  40.49 ml/m LA Vol (A4C):   171.0 ml 79.76 ml/m LA Biplane Vol: 186.0 ml 86.76 ml/m  AORTIC VALVE                     PULMONIC VALVE AV Area (Vmax):    1.12 cm      PV Vmax:       0.81  m/s AV Area (Vmean):   1.07 cm      PV Peak grad:  2.6 mmHg AV Area (VTI):     1.06 cm AV Vmax:           285.67 cm/s AV Vmean:          180.333 cm/s AV VTI:            0.529 m AV Peak Grad:      32.6 mmHg AV Mean Grad:      16.0 mmHg LVOT Vmax:         102.00 cm/s LVOT Vmean:        61.200 cm/s LVOT VTI:          0.178 m LVOT/AV VTI ratio: 0.34 AI PHT:            363 msec  AORTA Ao Root diam: 4.40 cm Ao Asc diam:  4.70 cm MITRAL VALVE                  TRICUSPID VALVE MV Area (PHT): 6.54 cm       TR Peak grad:   25.0 mmHg MV Area VTI:   2.07 cm       TR Vmax:        250.00 cm/s MV Peak grad:  9.1 mmHg MV Mean grad:  2.0 mmHg       SHUNTS MV Vmax:       1.51 m/s       Systemic VTI:  0.18 m MV Vmean:      62.5 cm/s      Systemic Diam: 2.00 cm MV Decel Time: 116 msec MR Peak grad:    70.9 mmHg MR Mean grad:    41.0 mmHg MR Vmax:         421.00 cm/s MR Vmean:        296.0 cm/s MR PISA:         1.79 cm MR PISA Eff ROA: 42 mm MR PISA Radius:  0.53 cm MV E velocity: 132.00 cm/s Eleonore Chiquito MD Electronically signed by Eleonore Chiquito MD Signature Date/Time: 12/23/2021/1:54:12 PM    Final    DG Chest 2 View  Result Date: 12/22/2021 CLINICAL DATA:  Chest pain, shortness of breath, cough EXAM: CHEST - 2 VIEW COMPARISON:  08/11/2021 FINDINGS: Prior median sternotomy. Cardiomegaly. No confluent  opacities, effusions or edema. No acute bony abnormality. IMPRESSION: Cardiomegaly.  No active disease. Electronically Signed   By: Rolm Baptise M.D.   On: 12/22/2021 19:27    Cardiac Studies   2D echo 12/23/2021 IMPRESSIONS    1. Left ventricular ejection fraction, by estimation, is 20 to 25%. The  left ventricle has severely decreased function. The left ventricle  demonstrates global hypokinesis. The left ventricular internal cavity size  was severely dilated. Indeterminate  diastolic filling due to E-A fusion.   2. Right ventricular systolic function is severely reduced. The right  ventricular size is moderately enlarged. There is normal pulmonary artery  systolic pressure. The estimated right ventricular systolic pressure is  01.7 mmHg.   3. Left atrial size was severely dilated.   4. Right atrial size was severely dilated.   5. The mitral valve is grossly normal. Moderate to severe mitral valve  regurgitation. No evidence of mitral stenosis.   6. S/p Aortic Valve Repair with 19 mm annuloplasty ring (05/11/2017).  There is residual aortic regurgitation that is severe and eccentric. V max  2.9 m/s, MG 16 mmHG. Suspect this  is related to AI. Recommend TEE for  further characterization. The aortic  valve has been repaired/replaced. Aortic valve regurgitation is severe.  There is a 19 mm valve present in the aortic position. Procedure Date:  05/11/2017.   7. Aortic dilatation noted. Aneurysm of the ascending aorta, measuring 47  mm. There is severe dilatation of the aortic root, measuring 44 mm.   8. The inferior vena cava is normal in size with <50% respiratory  variability, suggesting right atrial pressure of 8 mmHg.   Comparison(s): Changes from prior study are noted. EF now 20-25%. Severe  AI.   Patient Profile     41 y.o. male  with a hx of HFrEF (EF 40-45% in 03/2017, at 45% in 09/2019 and 30-35% in 04/2021), severe AI (s/p AV repair in 04/2017 with complex valvuloplasty  including plication of left coronary leaflet and aortic ring annuloplasty), moderate to severe MR (by echo in 04/2021), normal cors by cath in 2012 and 04/2017, HTN and Stage 2-3 CKD who is being seen 12/23/2021 for the evaluation of chest pain and CHF at the request of Dr. Clearence Ped.  Assessment & Plan    #Acute on chronic systolic heart failure, EF 30-35% #Severe aortic insufficiency #Severe mitral regurgitation -Admitted with worsening shortness of breath as well as cough.  He describes chest discomfort when he coughs and is consistent with pleuritic pain in the setting of cough.  Left heart cath in 2018 was normal. -Now grossly volume overloaded with BNP 3000.   -2D echo this admission now with worsening LV function from his baseline EF of 30 to 35% now down to 20 to 25% with global hypokinesis likely related to worsening valvular heart disease -He was started on Lasix 60 mg IV twice daily -He put out 1.55 L yesterday and is net -1.75 L since admission -Weight is down 3 pounds from yesterday and 8 pounds from admission -Serum creatinine bumped from 1.54>>1.6 today after addition of spironolactone -Continue carvedilol 25 mg twice daily, Entresto 97-23 mg twice daily -Replete potassium to keep greater than 4.  Mag was 2.0 today -[place] was stopped due to problems with swelling according to the patient -Case was discussed with Dr. Haroldine Laws and Dr. Davina Poke and recommendation was for outpatient TEE after treatment for his current heart failure exacerbation  -He is anxious to go home but still short of breath with cough at times and has crackles in his lung bases so we will give another day of IV diuretics and reassess  #Acute kidney injury -Secondary to volume overload.  -Initially improved with diuresis from 2.11>>1.54 but now bumped to 1.6 today -continue to follow while diuresing   #Elevated troponin, demand in the setting of acute systolic heart failure -hs troponin 50 >> 49 >> 42   -chest symptoms are pleuritic and likely related to pulmonary edema.  Improving with diuresis. -Left heart cath in 2018 was normal. -Nonischemic in the past and likely related to valvular heart disease.  EF is now worsened from 30 to 35% down to 20 to 25% but I suspect this is related to severe aortic and mitral valvular heart disease -No plans for ischemic evaluation this admission. -Ultimately will likely need work-up for valvular heart disease.  Suspect this will include left heart catheterization but we do not to do this this admission.  I have spent a total of 35 minutes with patient reviewing 2D echo, telemetry, EKGs, labs and examining patient as well as establishing an assessment and plan that was  discussed with the patient.  > 50% of time was spent in direct patient care.     For questions or updates, please contact [place] Please consult [email or web address] for contact info under        Signed, Fransico Him, MD  12/24/2021, 10:18 AM

## 2021-12-25 DIAGNOSIS — R079 Chest pain, unspecified: Secondary | ICD-10-CM | POA: Diagnosis not present

## 2021-12-25 DIAGNOSIS — N179 Acute kidney failure, unspecified: Secondary | ICD-10-CM | POA: Diagnosis not present

## 2021-12-25 DIAGNOSIS — I509 Heart failure, unspecified: Secondary | ICD-10-CM | POA: Diagnosis not present

## 2021-12-25 LAB — BASIC METABOLIC PANEL
Anion gap: 7 (ref 5–15)
BUN: 23 mg/dL — ABNORMAL HIGH (ref 6–20)
CO2: 24 mmol/L (ref 22–32)
Calcium: 8.5 mg/dL — ABNORMAL LOW (ref 8.9–10.3)
Chloride: 106 mmol/L (ref 98–111)
Creatinine, Ser: 1.57 mg/dL — ABNORMAL HIGH (ref 0.61–1.24)
GFR, Estimated: 57 mL/min — ABNORMAL LOW (ref >60)
Glucose, Bld: 86 mg/dL (ref 70–99)
Potassium: 3.8 mmol/L (ref 3.5–5.1)
Sodium: 137 mmol/L (ref 135–145)

## 2021-12-25 MED ORDER — ALBUTEROL SULFATE HFA 108 (90 BASE) MCG/ACT IN AERS: 2.0000 | INHALATION_SPRAY | Freq: Four times a day (QID) | RESPIRATORY_TRACT | 2 refills | Status: DC | PRN

## 2021-12-25 MED ORDER — POTASSIUM CHLORIDE ER 20 MEQ PO TBCR: 20.0000 meq | EXTENDED_RELEASE_TABLET | Freq: Every day | ORAL | 2 refills | Status: DC

## 2021-12-25 MED ORDER — FUROSEMIDE 80 MG PO TABS: 80.0000 mg | ORAL_TABLET | Freq: Two times a day (BID) | ORAL | 3 refills | Status: DC

## 2021-12-25 MED ORDER — ENTRESTO 97-103 MG PO TABS: 1.0000 | ORAL_TABLET | Freq: Two times a day (BID) | ORAL | 3 refills | Status: DC

## 2021-12-25 MED ORDER — ISOSORBIDE MONONITRATE 10 MG PO TABS: 10.0000 mg | ORAL_TABLET | Freq: Two times a day (BID) | ORAL | 3 refills | Status: DC

## 2021-12-25 MED ORDER — CARVEDILOL 25 MG PO TABS: 25.0000 mg | ORAL_TABLET | Freq: Two times a day (BID) | ORAL | 3 refills | Status: DC

## 2021-12-25 MED ORDER — FUROSEMIDE 80 MG PO TABS: 80.0000 mg | ORAL_TABLET | Freq: Two times a day (BID) | ORAL | Status: DC

## 2021-12-25 MED ORDER — HYDRALAZINE HCL 10 MG PO TABS: 10.0000 mg | ORAL_TABLET | Freq: Three times a day (TID) | ORAL | 3 refills | Status: DC

## 2021-12-25 NOTE — Discharge Summary (Signed)
[name] [name], is a 41 y.o. male  DOB 01/02/1981  MRN [id number].  Admission date:  12/22/2021  Admitting Physician  Murlean Iba, MD  Discharge Date:  12/25/2021   Primary MD  Sharilyn Sites, MD  Recommendations for primary care physician for things to follow:   1)Very low-salt diet advised 2)Weigh yourself daily, call if you gain more than 3 pounds in 1 day or more than 5 pounds in 1 week as your diuretic medications may need to be adjusted 3)Limit your Fluid  intake to no more than 60 ounces (1.8 Liters) per day 4)follow up with cardiologist Dr. Haroldine Laws as advised--- you may need referral to [facility] for evaluation for possible aortic valve surgery 5) please note that there has been several changes to your medications 6)Repeat CBC and BMP blood test in about a week or so advised 7)Avoid ibuprofen/Advil/Aleve/Motrin/Goody Powders/Naproxen/BC powders/Meloxicam/Diclofenac/Indomethacin and other Nonsteroidal anti-inflammatory medications as these will make you more likely to bleed and can cause stomach ulcers, can also cause Kidney problems.  Admission Diagnosis  AKI (acute kidney injury) ([place]) [N17.9] Chest pain [R07.9] Acute on chronic congestive heart failure, unspecified heart failure type ([place]) [I50.9] Left-sided chest pain [R07.9] Acute heart failure ([place]) [I50.9]   Discharge Diagnosis  AKI (acute kidney injury) ([place]) [N17.9] Chest pain [R07.9] Acute on chronic congestive heart failure, unspecified heart failure type ([place]) [I50.9] Left-sided chest pain [R07.9] Acute heart failure ([place]) [I50.9]  ***  Principal Problem:   Chest pain Active Problems:   Elevated troponin   AKI (acute kidney injury) ([place])   HTN (hypertension)   CHF (congestive heart failure) (HCC)   Acute heart failure reduced EF       Past Medical History:  Diagnosis Date   Anxiety    CHF  (congestive heart failure) (HCC)    Chronic systolic heart failure (HCC)    CKD (chronic kidney disease) stage 2, GFR 60-89 ml/min    Dyspnea    with value issues- "if I dont take my medication"   Essential hypertension, benign    Headache    History of pneumonia    Mitral regurgitation    Moderate   Noncompliance    Nonischemic cardiomyopathy ([place])    Normal coronaries May 2012, LVEF < 20%   Pneumonia 2011   S/P aortic valve repair 05/11/2017   Complex valvuloplasty including plication of left coronary leaflet and 48m Biostable HAART ring annuloplasty    Past Surgical History:  Procedure Laterality Date   AORTIC VALVE REPAIR N/A 05/11/2017   Procedure: AORTIC VALVE REPAIR;  Surgeon: ORexene Alberts MD;  [place]  Service: Open Heart Surgery;  Laterality: N/A;   CARDIAC SURGERY     RIGHT/LEFT HEART CATH AND CORONARY ANGIOGRAPHY N/A 04/20/2017   Procedure: RIGHT/LEFT HEART CATH AND CORONARY ANGIOGRAPHY;  Surgeon: BJolaine Artist MD;  [place] [facility];  Service: Cardiovascular;  Laterality: N/A;   SURGERY SCROTAL / TESTICULAR     Testicular torsion   TEE WITHOUT CARDIOVERSION  N/A 04/09/2017   Procedure: TRANSESOPHAGEAL ECHOCARDIOGRAM (TEE);  Surgeon: Jolaine Artist, MD;  Location: [facility] ENDOSCOPY;  Service: Cardiovascular;  Laterality: N/A;   TEE WITHOUT CARDIOVERSION N/A 05/11/2017   Procedure: TRANSESOPHAGEAL ECHOCARDIOGRAM (TEE);  Surgeon: Rexene Alberts, MD;  Location: [place];  Service: Open Heart Surgery;  Laterality: N/A;       HPI  from the history and physical done on the day of admission:     ***  ****     Hospital Course:     41 y.o. male with medical history significant of anxiety, CHF, CKD stage II, hypertension, nonischemic cardiomyopathy, aortic valve repair, and more presents to ED with a chief complaint of chest pain.  Patient reports that the symptoms initially started with cough on the 11th.  Around 4 PM on the 11th patient  started experiencing a cough productive of clear and sometimes yellow sputum.  He denies any fever.  He did have 1 episode of posttussis emesis.  On the 12th at 4 PM he developed chest pain.  It happened while he was loading the dishwasher.  Mostly only associated to when he coughs.  He describes the pain as a tightness and a squeezing.  There is no sharp or burning sensation.  The pain is located on the left side of his chest.  There is no radiation to his jaw or arm.  He had no palpitations.  Patient has never had a heart attack.  He has had bronchitis and his symptoms today feel similar to when he had bronchitis in the past.  He did hear himself wheezing today.  He has no history of asthma.  He had an inhaler left over from when he had bronchitis, and attempted to use it but it did not help his symptoms today.  Patient has no personal or family history of premature MI.  Patient reports no peripheral edema, but reports that he usually does not get peripheral edema.  Since starting [place] he has had more peripheral edema, but is still rare.  Patient has no other complaints at this time  Patient does not smoke, he drinks about once per year, he does not use illicit drugs, and he is not vaccinated for COVID.  Patient is full code.  ***** Assessment and Plan: * Chest pain - Chest pain with noncardiac features including worse with cough, no radiation, nonexertional - Troponin mildly elevated 50, 49 - Cycle another troponin with a.m. labs - EKG shows no acute ischemic changes - Chest x-ray shows cardiomegaly - Patient given Tylenol, Tessalon in the ED - Currently chest pain-free - Cards consult as per the assessment for CHF  Elevated troponin Troponin is elevated at 50 and 49 - Cycle 1 more troponin with a.m. labs - Chest pain currently resolved - EKG shows no acute ischemic changes - Echo in the a.m. - Inpatient consult to cardiology  Acute heart failure reduced EF  -- appreciate cardiology  input -- follow up repeated TTE -- continue diuresis as ordered -- further recommendations to follow  CHF (congestive heart failure) ([place]) - History of CHF with an ejection fraction of 30-35% in October 2022 - No peripheral edema, no oxygen requirement, no dyspnea - Chest x-ray shows cardiomegaly - Continue Lasix, Entresto, Coreg - [employer] as patient believes he has been more symptomatic since starting it -Cardiology consulted from the ED and recommends repeat echo in the a.m. and inpatient cardiology consult-orders placed  HTN (hypertension) - Continue hydralazine and Coreg -  Continue to monitor  AKI (acute kidney injury) ([place]) - Creatinine baseline 1.62, creatinine today 2.11 - Likely cardiorenal syndrome - Patient was given fluid in the ED, but I think he is more likely to have congestion-start Lasix - Trend in the a.m.        Discharge Condition: ***  Follow UP   Follow-up Information     Bensimhon, Shaune Pascal, MD Follow up on 01/05/2022.   Specialty: Cardiology Why: Follow-up with the [facility] on 01/05/2022 at 3:30 PM. Will be with an NP or PA that works with Dr. Haroldine Laws. Contact information: [address] [place] [address] [phone]                  Consults obtained - ***  Diet and Activity recommendation:  As advised  Discharge Instructions    **** Discharge Instructions     Call MD for:  difficulty breathing, headache or visual disturbances   Complete by: As directed    Call MD for:  extreme fatigue   Complete by: As directed    Call MD for:  persistant dizziness or light-headedness   Complete by: As directed    Call MD for:  persistant nausea and vomiting   Complete by: As directed    Call MD for:  temperature >100.4   Complete by: As directed    Diet - low sodium heart healthy   Complete by: As directed    Discharge instructions   Complete by: As directed    1)Very low-salt diet  advised 2)Weigh yourself daily, call if you gain more than 3 pounds in 1 day or more than 5 pounds in 1 week as your diuretic medications may need to be adjusted 3)Limit your Fluid  intake to no more than 60 ounces (1.8 Liters) per day 4)follow up with cardiologist Dr. Haroldine Laws as advised--- you may need referral to [facility] for evaluation for possible aortic valve surgery 5) please note that there has been several changes to your medications 6)Repeat CBC and BMP blood test in about a week or so advised 7)Avoid ibuprofen/Advil/Aleve/Motrin/Goody Powders/Naproxen/BC powders/Meloxicam/Diclofenac/Indomethacin and other Nonsteroidal anti-inflammatory medications as these will make you more likely to bleed and can cause stomach ulcers, can also cause Kidney problems.   Increase activity slowly   Complete by: As directed          Discharge Medications     Allergies as of 12/25/2021   No Known Allergies      Medication List     STOP taking these medications    colchicine 0.6 MG tablet   dapagliflozin propanediol 10 MG Tabs tablet Commonly known as: Farxiga   potassium chloride SA 20 MEQ tablet Commonly known as: KLOR-CON M   predniSONE 10 MG (21) Tbpk tablet Commonly known as: STERAPRED UNI-PAK 21 TAB       TAKE these medications    acetaminophen 500 MG tablet Commonly known as: TYLENOL Take 1,000 mg by mouth every 6 (six) hours as needed for moderate pain or headache.   albuterol 108 (90 Base) MCG/ACT inhaler Commonly known as: VENTOLIN HFA Inhale 2 puffs into the lungs every 6 (six) hours as needed for wheezing or shortness of breath.   allopurinol 300 MG tablet Commonly known as: ZYLOPRIM Take 1 tablet (300 mg total) by mouth daily.   ALPRAZolam 0.5 MG tablet Commonly known as: XANAX Take 0.5 mg by mouth at bedtime.   carvedilol 25 MG tablet Commonly known  as: COREG Take 1 tablet (25 mg total) by mouth 2 (two) times daily with a meal.    Entresto 97-103 MG Generic drug: sacubitril-valsartan Take 1 tablet by mouth 2 (two) times daily.   furosemide 80 MG tablet Commonly known as: LASIX Take 1 tablet (80 mg total) by mouth 2 (two) times daily. What changed: when to take this   hydrALAZINE 10 MG tablet Commonly known as: APRESOLINE Take 1 tablet (10 mg total) by mouth 3 (three) times daily. What changed:  medication strength how much to take   isosorbide mononitrate 10 MG tablet Commonly known as: ISMO Take 1 tablet (10 mg total) by mouth 2 (two) times daily.   Potassium Chloride ER 20 MEQ Tbcr Take 20 mEq by mouth daily. Take daily while on Lasix/furosemide        Major procedures and Radiology Reports - PLEASE review detailed and final reports for all details, in brief -   ***  ECHOCARDIOGRAM COMPLETE  Result Date: 12/23/2021    ECHOCARDIOGRAM REPORT   Patient Name:   [name] [name] Date of Exam: 12/23/2021 Medical Rec #:  [id number]         Height:       70.0 in Accession #:    [id number]        Weight:       213.0 lb Date of Birth:  1981-05-13         BSA:          2.144 m Patient Age:    55 years          BP:           105/71 mmHg Patient Gender: M                 HR:           78 bpm. Exam Location:  [facility] Procedure: 2D Echo, Cardiac Doppler and Color Doppler Indications:    Chest Pain  History:        Patient has prior history of Echocardiogram examinations, most                 recent 04/29/2021. CHF and Cardiomyopathy, Signs/Symptoms:Chest                 Pain; Risk Factors:Hypertension. S/p Aortic valve repair                 05/11/2017. HAART aortic ring.                 Aortic Valve: 19 mm valve is present in the aortic position.                 Procedure Date: 05/11/2017.  Sonographer:    Wenda Low Referring Phys: [id number] ASIA B [place]  1. Left ventricular ejection fraction, by estimation, is 20 to 25%. The left ventricle has severely decreased function. The left ventricle  demonstrates global hypokinesis. The left ventricular internal cavity size was severely dilated. Indeterminate diastolic filling due to E-A fusion.  2. Right ventricular systolic function is severely reduced. The right ventricular size is moderately enlarged. There is normal pulmonary artery systolic pressure. The estimated right ventricular systolic pressure is 18.2 mmHg.  3. Left atrial size was severely dilated.  4. Right atrial size was severely dilated.  5. The mitral valve is grossly normal. Moderate to severe mitral valve regurgitation. No evidence of mitral stenosis.  6. S/p Aortic Valve Repair with 19 mm annuloplasty ring (  05/11/2017). There is residual aortic regurgitation that is severe and eccentric. V max 2.9 m/s, MG 16 mmHG. Suspect this is related to AI. Recommend TEE for further characterization. The aortic valve has been repaired/replaced. Aortic valve regurgitation is severe. There is a 19 mm valve present in the aortic position. Procedure Date: 05/11/2017.  7. Aortic dilatation noted. Aneurysm of the ascending aorta, measuring 47 mm. There is severe dilatation of the aortic root, measuring 44 mm.  8. The inferior vena cava is normal in size with <50% respiratory variability, suggesting right atrial pressure of 8 mmHg. Comparison(s): Changes from prior study are noted. EF now 20-25%. Severe AI. FINDINGS  Left Ventricle: Left ventricular ejection fraction, by estimation, is 20 to 25%. The left ventricle has severely decreased function. The left ventricle demonstrates global hypokinesis. The left ventricular internal cavity size was severely dilated. There is no concentric left ventricular hypertrophy. Indeterminate diastolic filling due to E-A fusion. Right Ventricle: The right ventricular size is moderately enlarged. No increase in right ventricular wall thickness. Right ventricular systolic function is severely reduced. There is normal pulmonary artery systolic pressure. The tricuspid regurgitant  velocity is 2.50 m/s, and with an assumed right atrial pressure of 8 mmHg, the estimated right ventricular systolic pressure is 76.8 mmHg. Left Atrium: Left atrial size was severely dilated. Right Atrium: Right atrial size was severely dilated. Pericardium: There is no evidence of pericardial effusion. Mitral Valve: The mitral valve is grossly normal. Moderate to severe mitral valve regurgitation. No evidence of mitral valve stenosis. MV peak gradient, 9.1 mmHg. The mean mitral valve gradient is 2.0 mmHg. Tricuspid Valve: The tricuspid valve is grossly normal. Tricuspid valve regurgitation is mild . No evidence of tricuspid stenosis. Aortic Valve: S/p Aortic Valve Repair with 19 mm annuloplasty ring (05/11/2017). There is residual aortic regurgitation that is severe and eccentric. V max 2.9 m/s, MG 16 mmHG. Suspect this is related to AI. Recommend TEE for further characterization. The aortic valve has been repaired/replaced. Aortic valve regurgitation is severe. Aortic regurgitation PHT measures 363 msec. Aortic valve mean gradient measures 16.0 mmHg. Aortic valve peak gradient measures 32.6 mmHg. Aortic valve area, by VTI measures 1.06 cm. There is a 19 mm valve present in the aortic position. Procedure Date: 05/11/2017. Pulmonic Valve: The pulmonic valve was grossly normal. Pulmonic valve regurgitation is mild. No evidence of pulmonic stenosis. Aorta: Aortic dilatation noted. There is severe dilatation of the aortic root, measuring 44 mm. There is an aneurysm involving the ascending aorta measuring 47 mm. Venous: The inferior vena cava is normal in size with less than 50% respiratory variability, suggesting right atrial pressure of 8 mmHg. IAS/Shunts: The atrial septum is grossly normal.  LEFT VENTRICLE PLAX 2D LVIDd:         6.90 cm LVIDs:         5.70 cm LV PW:         1.40 cm LV IVS:        1.30 cm LVOT diam:     2.00 cm LV SV:         56 LV SV Index:   26 LVOT Area:     3.14 cm  LV Volumes (MOD) LV vol d,  MOD A2C: 231.0 ml LV vol d, MOD A4C: 214.0 ml LV vol s, MOD A2C: 181.0 ml LV vol s, MOD A4C: 126.0 ml LV SV MOD A2C:     50.0 ml LV SV MOD A4C:     214.0 ml LV SV MOD BP:  75.8 ml RIGHT VENTRICLE RV Basal diam:  3.95 cm RV Mid diam:    3.10 cm RV S prime:     9.03 cm/s TAPSE (M-mode): 1.6 cm LEFT ATRIUM              Index        RIGHT ATRIUM           Index LA diam:        6.20 cm  2.89 cm/m   RA Area:     26.90 cm LA Vol (A2C):   193.0 ml 90.02 ml/m  RA Volume:   86.80 ml  40.49 ml/m LA Vol (A4C):   171.0 ml 79.76 ml/m LA Biplane Vol: 186.0 ml 86.76 ml/m  AORTIC VALVE                     PULMONIC VALVE AV Area (Vmax):    1.12 cm      PV Vmax:       0.81 m/s AV Area (Vmean):   1.07 cm      PV Peak grad:  2.6 mmHg AV Area (VTI):     1.06 cm AV Vmax:           285.67 cm/s AV Vmean:          180.333 cm/s AV VTI:            0.529 m AV Peak Grad:      32.6 mmHg AV Mean Grad:      16.0 mmHg LVOT Vmax:         102.00 cm/s LVOT Vmean:        61.200 cm/s LVOT VTI:          0.178 m LVOT/AV VTI ratio: 0.34 AI PHT:            363 msec  AORTA Ao Root diam: 4.40 cm Ao Asc diam:  4.70 cm MITRAL VALVE                  TRICUSPID VALVE MV Area (PHT): 6.54 cm       TR Peak grad:   25.0 mmHg MV Area VTI:   2.07 cm       TR Vmax:        250.00 cm/s MV Peak grad:  9.1 mmHg MV Mean grad:  2.0 mmHg       SHUNTS MV Vmax:       1.51 m/s       Systemic VTI:  0.18 m MV Vmean:      62.5 cm/s      Systemic Diam: 2.00 cm MV Decel Time: 116 msec MR Peak grad:    70.9 mmHg MR Mean grad:    41.0 mmHg MR Vmax:         421.00 cm/s MR Vmean:        296.0 cm/s MR PISA:         1.79 cm MR PISA Eff ROA: 42 mm MR PISA Radius:  0.53 cm MV E velocity: 132.00 cm/s Eleonore Chiquito MD Electronically signed by Eleonore Chiquito MD Signature Date/Time: 12/23/2021/1:54:12 PM    Final    DG Chest 2 View  Result Date: 12/22/2021 CLINICAL DATA:  Chest pain, shortness of breath, cough EXAM: CHEST - 2 VIEW COMPARISON:  08/11/2021 FINDINGS: Prior median  sternotomy. Cardiomegaly. No confluent opacities, effusions or edema. No acute bony abnormality. IMPRESSION: Cardiomegaly.  No active disease. Electronically Signed   By: Rolm Baptise  M.D.   On: 12/22/2021 19:27    Micro Results   *** Recent Results (from the past 240 hour(s))  SARS Coronavirus 2 by RT PCR (hospital order, performed in [facility] hospital lab) *cepheid single result test* Anterior Nasal Swab     Status: None   Collection Time: 12/22/21  8:19 PM   Specimen: Anterior Nasal Swab  Result Value Ref Range Status   SARS Coronavirus 2 by RT PCR NEGATIVE NEGATIVE Final    Comment: (NOTE) SARS-CoV-2 target nucleic acids are NOT DETECTED.  The SARS-CoV-2 RNA is generally detectable in upper and lower respiratory specimens during the acute phase of infection. The lowest concentration of SARS-CoV-2 viral copies this assay can detect is 250 copies / mL. A negative result does not preclude SARS-CoV-2 infection and should not be used as the sole basis for treatment or other patient management decisions.  A negative result may occur with improper specimen collection / handling, submission of specimen other than nasopharyngeal swab, presence of viral mutation(s) within the areas targeted by this assay, and inadequate number of viral copies (<250 copies / mL). A negative result must be combined with clinical observations, patient history, and epidemiological information.  Fact Sheet for Patients:   https://www.patel.info/  Fact Sheet for Healthcare Providers: https://hall.com/  This test is not yet approved or  cleared by the [place] FDA and has been authorized for detection and/or diagnosis of SARS-CoV-2 by FDA under an Emergency Use Authorization (EUA).  This EUA will remain in effect (meaning this test can be used) for the duration of the COVID-19 declaration under Section 564(b)(1) of the Act, 21 U.S.C. section 360bbb-3(b)(1),  unless the authorization is terminated or revoked sooner.  Performed at [facility], [address], [place], [place] [address]     Today   Subjective    [name] [name] today has no ***          Patient has been seen and examined prior to discharge   Objective   Blood pressure 113/70, pulse 80, temperature 98 F (36.7 C), temperature source Oral, resp. rate 20, height '5\' 10"'$  (1.778 m), weight 94.4 kg, SpO2 97 %.   Intake/Output Summary (Last 24 hours) at 12/25/2021 1119 Last data filed at 12/25/2021 0900 Gross per 24 hour  Intake 960 ml  Output 1200 ml  Net -240 ml    Exam Gen:- Awake Alert, no acute distress *** HEENT:- [place].AT, No sclera icterus Neck-Supple Neck,No JVD,.  Lungs-  CTAB , good air movement bilaterally CV- S1, S2 normal, regular Abd-  +ve B.Sounds, Abd Soft, No tenderness,    Extremity/Skin:- No  edema,   good pulses Psych-affect is appropriate, oriented x3 Neuro-no new focal deficits, no tremors ***   Data Review   CBC w Diff:  Lab Results  Component Value Date   WBC 6.7 12/23/2021   HGB 13.7 12/23/2021   HCT 41.7 12/23/2021   PLT 160 12/23/2021   LYMPHOPCT 17 12/23/2021   MONOPCT 10 12/23/2021   EOSPCT 2 12/23/2021   BASOPCT 1 12/23/2021    CMP:  Lab Results  Component Value Date   NA 137 12/25/2021   K 3.8 12/25/2021   CL 106 12/25/2021   CO2 24 12/25/2021   BUN 23 (H) 12/25/2021   CREATININE 1.57 (H) 12/25/2021   PROT 6.0 (L) 12/23/2021   ALBUMIN 3.2 (L) 12/23/2021   BILITOT 1.1 12/23/2021   ALKPHOS 52 12/23/2021   AST 14 (L) 12/23/2021   ALT 15 12/23/2021  .  Total Discharge time is about 33 minutes  Roxan Hockey M.D on 12/25/2021 at 11:19 AM  Go to [email or web address] -  for contact info  [facility] - Office  [phone]

## 2021-12-25 NOTE — Progress Notes (Signed)
DAILY PROGRESS NOTE   Patient Name: [name] [name] Date of Encounter: 12/25/2021 Cardiologist: [name] Bickers, MD  Chief Complaint   Breathing better  Patient Profile   [name] [name] is a 41 y.o. male with a hx of HFrEF (EF 40-45% in 03/2017, at 45% in 09/2019 and 30-35% in 04/2021), severe AI (s/p AV repair in 04/2017 with complex valvuloplasty including plication of left coronary leaflet and aortic ring annuloplasty), moderate to severe MR (by echo in 04/2021), normal cors by cath in 2012 and 04/2017, HTN and Stage 2-3 CKD who is being seen 12/23/2021 for the evaluation of chest pain and CHF at the request of Dr. Clearence [name].  Subjective   Diuresis was brisk overnight - not all recorded -weight down another 1.2 KG overnight.  Creatinine stable. He is eager to go home to celebrate his twin sons birthdays.  Objective   Vitals:   12/24/21 2100 12/25/21 0455 12/25/21 0500 12/25/21 0847  BP: (!) 96/57 (!) 100/56  113/70  Pulse: 79 85  80  Resp: 18 20    Temp: 97.7 F (36.5 C) 98 F (36.7 C)    TempSrc: Tympanic Oral    SpO2: 98% 98%  97%  Weight:   94.4 kg   Height:        Intake/Output Summary (Last 24 hours) at 12/25/2021 1002 Last data filed at 12/25/2021 0900 Gross per 24 hour  Intake 960 ml  Output 1200 ml  Net -240 ml   Filed Weights   12/23/21 0616 12/24/21 0500 12/25/21 0500  Weight: 96.6 kg 95.6 kg 94.4 kg    Physical Exam   General appearance: alert and no distress Neck: no carotid bruit, no JVD, and thyroid not enlarged, symmetric, no tenderness/mass/nodules Lungs: clear to auscultation bilaterally Heart: regular rate and rhythm Abdomen: soft, non-tender; bowel sounds normal; no masses,  no organomegaly Extremities: extremities normal, atraumatic, no cyanosis or edema Pulses: 2+ and symmetric Skin: Skin color, texture, turgor normal. No rashes or lesions Neurologic: Grossly normal Psych: Pleasant  Inpatient Medications    Scheduled  Meds:  ALPRAZolam  0.5 mg Oral QHS   carvedilol  25 mg Oral BID WC   furosemide  60 mg Intravenous BID   heparin  5,000 Units Subcutaneous Q8H   sacubitril-valsartan  1 tablet Oral BID    Continuous Infusions:   PRN Meds: acetaminophen **OR** acetaminophen, albuterol, morphine injection, ondansetron **OR** ondansetron (ZOFRAN) IV, oxyCODONE   Labs   Results for orders placed or performed during the hospital encounter of 12/22/21 (from the past 48 hour(s))  Basic metabolic panel     Status: Abnormal   Collection Time: 12/24/21  4:38 AM  Result Value Ref Range   Sodium 136 135 - 145 mmol/L   Potassium 3.6 3.5 - 5.1 mmol/L   Chloride 108 98 - 111 mmol/L   CO2 21 (L) 22 - 32 mmol/L   Glucose, Bld 98 70 - 99 mg/dL    Comment: Glucose reference range applies only to samples taken after fasting for at least 8 hours.   BUN 23 (H) 6 - 20 mg/dL   Creatinine, Ser 1.60 (H) 0.61 - 1.24 mg/dL   Calcium 8.0 (L) 8.9 - 10.3 mg/dL   GFR, Estimated 56 (L) >60 mL/min    Comment: (NOTE) Calculated using the CKD-EPI Creatinine Equation (2021)    Anion gap 7 5 - 15    Comment: Performed at [facility], [address], [place], [place] [address]  Magnesium  Status: None   Collection Time: 12/24/21  4:38 AM  Result Value Ref Range   Magnesium 2.0 1.7 - 2.4 mg/dL    Comment: Performed at [facility], [address], [place], [place] [address]  Basic metabolic panel     Status: Abnormal   Collection Time: 12/25/21  4:59 AM  Result Value Ref Range   Sodium 137 135 - 145 mmol/L   Potassium 3.8 3.5 - 5.1 mmol/L   Chloride 106 98 - 111 mmol/L   CO2 24 22 - 32 mmol/L   Glucose, Bld 86 70 - 99 mg/dL    Comment: Glucose reference range applies only to samples taken after fasting for at least 8 hours.   BUN 23 (H) 6 - 20 mg/dL   Creatinine, Ser 1.57 (H) 0.61 - 1.24 mg/dL   Calcium 8.5 (L) 8.9 - 10.3 mg/dL   GFR, Estimated 57 (L) >60 mL/min    Comment: (NOTE) Calculated using the CKD-EPI  Creatinine Equation (2021)    Anion gap 7 5 - 15    Comment: Performed at [facility], [address], [place], [place] [address]    ECG   N/A  Telemetry   Sinus rhythm - Personally Reviewed  Radiology    ECHOCARDIOGRAM COMPLETE  Result Date: 12/23/2021    ECHOCARDIOGRAM REPORT   Patient Name:   [name] [name] Date of Exam: 12/23/2021 Medical Rec #:  [id number]         Height:       70.0 in Accession #:    [id number]        Weight:       213.0 lb Date of Birth:  07-08-1981         BSA:          2.144 m Patient Age:    38 years          BP:           105/71 mmHg Patient Gender: M                 HR:           78 bpm. Exam Location:  [facility] Procedure: 2D Echo, Cardiac Doppler and Color Doppler Indications:    Chest Pain  History:        Patient has prior history of Echocardiogram examinations, most                 recent 04/29/2021. CHF and Cardiomyopathy, Signs/Symptoms:Chest                 Pain; Risk Factors:Hypertension. S/p Aortic valve repair                 05/11/2017. HAART aortic ring.                 Aortic Valve: 19 mm valve is present in the aortic position.                 Procedure Date: 05/11/2017.  Sonographer:    Wenda Low Referring Phys: [id number] [name] [name]  1. Left ventricular ejection fraction, by estimation, is 20 to 25%. The left ventricle has severely decreased function. The left ventricle demonstrates global hypokinesis. The left ventricular internal cavity size was severely dilated. Indeterminate diastolic filling due to E-A fusion.  2. Right ventricular systolic function is severely reduced. The right ventricular size is moderately enlarged. There is normal pulmonary artery systolic pressure. The estimated right ventricular systolic pressure  is 33.0 mmHg.  3. Left atrial size was severely dilated.  4. Right atrial size was severely dilated.  5. The mitral valve is grossly normal. Moderate to severe mitral valve regurgitation. No evidence of  mitral stenosis.  6. S/p Aortic Valve Repair with 19 mm annuloplasty ring (05/11/2017). There is residual aortic regurgitation that is severe and eccentric. V max 2.9 m/s, MG 16 mmHG. Suspect this is related to AI. Recommend TEE for further characterization. The aortic valve has been repaired/replaced. Aortic valve regurgitation is severe. There is a 19 mm valve present in the aortic position. Procedure Date: 05/11/2017.  7. Aortic dilatation noted. Aneurysm of the ascending aorta, measuring 47 mm. There is severe dilatation of the aortic root, measuring 44 mm.  8. The inferior vena cava is normal in size with <50% respiratory variability, suggesting right atrial pressure of 8 mmHg. Comparison(s): Changes from prior study are noted. EF now 20-25%. Severe AI. FINDINGS  Left Ventricle: Left ventricular ejection fraction, by estimation, is 20 to 25%. The left ventricle has severely decreased function. The left ventricle demonstrates global hypokinesis. The left ventricular internal cavity size was severely dilated. There is no concentric left ventricular hypertrophy. Indeterminate diastolic filling due to E-A fusion. Right Ventricle: The right ventricular size is moderately enlarged. No increase in right ventricular wall thickness. Right ventricular systolic function is severely reduced. There is normal pulmonary artery systolic pressure. The tricuspid regurgitant velocity is 2.50 m/s, and with an assumed right atrial pressure of 8 mmHg, the estimated right ventricular systolic pressure is 07.3 mmHg. Left Atrium: Left atrial size was severely dilated. Right Atrium: Right atrial size was severely dilated. Pericardium: There is no evidence of pericardial effusion. Mitral Valve: The mitral valve is grossly normal. Moderate to severe mitral valve regurgitation. No evidence of mitral valve stenosis. MV peak gradient, 9.1 mmHg. The mean mitral valve gradient is 2.0 mmHg. Tricuspid Valve: The tricuspid valve is grossly  normal. Tricuspid valve regurgitation is mild . No evidence of tricuspid stenosis. Aortic Valve: S/p Aortic Valve Repair with 19 mm annuloplasty ring (05/11/2017). There is residual aortic regurgitation that is severe and eccentric. V max 2.9 m/s, MG 16 mmHG. Suspect this is related to AI. Recommend TEE for further characterization. The aortic valve has been repaired/replaced. Aortic valve regurgitation is severe. Aortic regurgitation PHT measures 363 msec. Aortic valve mean gradient measures 16.0 mmHg. Aortic valve peak gradient measures 32.6 mmHg. Aortic valve area, by VTI measures 1.06 cm. There is a 19 mm valve present in the aortic position. Procedure Date: 05/11/2017. Pulmonic Valve: The pulmonic valve was grossly normal. Pulmonic valve regurgitation is mild. No evidence of pulmonic stenosis. Aorta: Aortic dilatation noted. There is severe dilatation of the aortic root, measuring 44 mm. There is an aneurysm involving the ascending aorta measuring 47 mm. Venous: The inferior vena cava is normal in size with less than 50% respiratory variability, suggesting right atrial pressure of 8 mmHg. IAS/Shunts: The atrial septum is grossly normal.  LEFT VENTRICLE PLAX 2D LVIDd:         6.90 cm LVIDs:         5.70 cm LV PW:         1.40 cm LV IVS:        1.30 cm LVOT diam:     2.00 cm LV SV:         56 LV SV Index:   26 LVOT Area:     3.14 cm  LV Volumes (MOD) LV vol d,  MOD A2C: 231.0 ml LV vol d, MOD A4C: 214.0 ml LV vol s, MOD A2C: 181.0 ml LV vol s, MOD A4C: 126.0 ml LV SV MOD A2C:     50.0 ml LV SV MOD A4C:     214.0 ml LV SV MOD BP:      75.8 ml RIGHT VENTRICLE RV Basal diam:  3.95 cm RV Mid diam:    3.10 cm RV S prime:     9.03 cm/s TAPSE (M-mode): 1.6 cm LEFT ATRIUM              Index        RIGHT ATRIUM           Index LA diam:        6.20 cm  2.89 cm/m   RA Area:     26.90 cm LA Vol (A2C):   193.0 ml 90.02 ml/m  RA Volume:   86.80 ml  40.49 ml/m LA Vol (A4C):   171.0 ml 79.76 ml/m LA Biplane Vol: 186.0 ml  86.76 ml/m  AORTIC VALVE                     PULMONIC VALVE AV Area (Vmax):    1.12 cm      PV Vmax:       0.81 m/s AV Area (Vmean):   1.07 cm      PV Peak grad:  2.6 mmHg AV Area (VTI):     1.06 cm AV Vmax:           285.67 cm/s AV Vmean:          180.333 cm/s AV VTI:            0.529 m AV Peak Grad:      32.6 mmHg AV Mean Grad:      16.0 mmHg LVOT Vmax:         102.00 cm/s LVOT Vmean:        61.200 cm/s LVOT VTI:          0.178 m LVOT/AV VTI ratio: 0.34 AI PHT:            363 msec  AORTA Ao Root diam: 4.40 cm Ao Asc diam:  4.70 cm MITRAL VALVE                  TRICUSPID VALVE MV Area (PHT): 6.54 cm       TR Peak grad:   25.0 mmHg MV Area VTI:   2.07 cm       TR Vmax:        250.00 cm/s MV Peak grad:  9.1 mmHg MV Mean grad:  2.0 mmHg       SHUNTS MV Vmax:       1.51 m/s       Systemic VTI:  0.18 m MV Vmean:      62.5 cm/s      Systemic Diam: 2.00 cm MV Decel Time: 116 msec MR Peak grad:    70.9 mmHg MR Mean grad:    41.0 mmHg MR Vmax:         421.00 cm/s MR Vmean:        296.0 cm/s MR PISA:         1.79 cm MR PISA Eff ROA: 42 mm MR PISA Radius:  0.53 cm MV E velocity: 132.00 cm/s Eleonore Chiquito MD Electronically signed by Eleonore Chiquito MD Signature Date/Time: 12/23/2021/1:54:12 PM    Final  Cardiac Studies   See echo  Assessment   Principal Problem:   Chest pain Active Problems:   AKI (acute kidney injury) (HCC)   Elevated troponin   HTN (hypertension)   CHF (congestive heart failure) (HCC)   Acute heart failure reduced EF    Plan   Feels better - brisk diuresis overnight. Creatinine stable. Wants to go home to celebrate his kids birthdays today. We have arranged early follow-up with the heart failure clinic. Stop IV lasix, transition back to lasix 80 mg po BID (previously on 80 mg daily - getting 60 mg IV BID in the hospital) - plan was to start aldactone 12.5 mg daily, however, this was stopped d/t hyperkalemia in the past. Ok to d/c home today.  Time Spent Directly with  Patient:  I have spent a total of 25 minutes with the patient reviewing hospital notes, telemetry, EKGs, labs and examining the patient as well as establishing an assessment and plan that was discussed personally with the patient.  > 50% of time was spent in direct patient care.  Length of Stay:  LOS: 2 days   Pixie Casino, MD, [facility], [place] Director of the Advanced Lipid Disorders &  Cardiovascular Risk Reduction Clinic Diplomate of the American Board of Clinical Lipidology Attending Cardiologist  Direct Dial: [phone]  Fax: [phone]  Website:  [email or web address] 12/25/2021, 10:02 AM

## 2021-12-25 NOTE — Discharge Instructions (Signed)
1)Very low-salt diet advised 2)Weigh yourself daily, call if you gain more than 3 pounds in 1 day or more than 5 pounds in 1 week as your diuretic medications may need to be adjusted 3)Limit your Fluid  intake to no more than 60 ounces (1.8 Liters) per day 4)follow up with cardiologist Dr. Haroldine Laws as advised--- you may need referral to [facility] for evaluation for possible aortic valve surgery 5) please note that there has been several changes to your medications 6)Repeat CBC and BMP blood test in about a week or so advised 7)Avoid ibuprofen/Advil/Aleve/Motrin/Goody Powders/Naproxen/BC powders/Meloxicam/Diclofenac/Indomethacin and other Nonsteroidal anti-inflammatory medications as these will make you more likely to bleed and can cause stomach ulcers, can also cause Kidney problems.

## 2022-01-02 ENCOUNTER — Telehealth (HOSPITAL_COMMUNITY): Payer: Self-pay

## 2022-01-05 ENCOUNTER — Encounter (HOSPITAL_COMMUNITY): Payer: Self-pay

## 2022-01-05 ENCOUNTER — Ambulatory Visit (HOSPITAL_COMMUNITY)
Admit: 2022-01-05 | Discharge: 2022-01-05 | Disposition: A | Discharge status: Home / Self Care | Payer: BC Managed Care – PPO | Attending: Family Medicine | Admitting: Family Medicine

## 2022-01-05 VITALS — BP 94/62 | HR 72 | Wt 214.6 lb

## 2022-01-05 DIAGNOSIS — I5022 Chronic systolic (congestive) heart failure: Secondary | ICD-10-CM | POA: Diagnosis not present

## 2022-01-05 DIAGNOSIS — R002 Palpitations: Secondary | ICD-10-CM | POA: Insufficient documentation

## 2022-01-05 DIAGNOSIS — I428 Other cardiomyopathies: Secondary | ICD-10-CM | POA: Insufficient documentation

## 2022-01-05 DIAGNOSIS — N183 Chronic kidney disease, stage 3 unspecified: Secondary | ICD-10-CM | POA: Insufficient documentation

## 2022-01-05 DIAGNOSIS — Z7902 Long term (current) use of antithrombotics/antiplatelets: Secondary | ICD-10-CM | POA: Insufficient documentation

## 2022-01-05 DIAGNOSIS — Z9889 Other specified postprocedural states: Secondary | ICD-10-CM

## 2022-01-05 DIAGNOSIS — Z79899 Other long term (current) drug therapy: Secondary | ICD-10-CM | POA: Diagnosis not present

## 2022-01-05 DIAGNOSIS — I340 Nonrheumatic mitral (valve) insufficiency: Secondary | ICD-10-CM | POA: Insufficient documentation

## 2022-01-05 DIAGNOSIS — E875 Hyperkalemia: Secondary | ICD-10-CM | POA: Diagnosis not present

## 2022-01-05 DIAGNOSIS — N1831 Chronic kidney disease, stage 3a: Secondary | ICD-10-CM

## 2022-01-05 DIAGNOSIS — I10 Essential (primary) hypertension: Secondary | ICD-10-CM | POA: Diagnosis not present

## 2022-01-05 DIAGNOSIS — Z139 Encounter for screening, unspecified: Secondary | ICD-10-CM

## 2022-01-05 DIAGNOSIS — I130 Hypertensive heart and chronic kidney disease with heart failure and stage 1 through stage 4 chronic kidney disease, or unspecified chronic kidney disease: Secondary | ICD-10-CM | POA: Insufficient documentation

## 2022-01-05 LAB — BASIC METABOLIC PANEL
Anion gap: 5 (ref 5–15)
BUN: 16 mg/dL (ref 6–20)
CO2: 23 mmol/L (ref 22–32)
Calcium: 8.5 mg/dL — ABNORMAL LOW (ref 8.9–10.3)
Chloride: 112 mmol/L — ABNORMAL HIGH (ref 98–111)
Creatinine, Ser: 1.74 mg/dL — ABNORMAL HIGH (ref 0.61–1.24)
GFR, Estimated: 50 mL/min — ABNORMAL LOW (ref >60)
Glucose, Bld: 103 mg/dL — ABNORMAL HIGH (ref 70–99)
Potassium: 4.3 mmol/L (ref 3.5–5.1)
Sodium: 140 mmol/L (ref 135–145)

## 2022-01-05 LAB — BRAIN NATRIURETIC PEPTIDE: B Natriuretic Peptide: 3506.9 pg/mL — ABNORMAL HIGH (ref 0.0–100.0)

## 2022-01-05 NOTE — Progress Notes (Signed)
Advanced Heart Failure Clinic Note   Patient ID: [name] [name], male   DOB: Oct 06, 1980, 41 y.o.   MRN: [id number]  Primary PCP: [facility]  HF Cardiologist: Dr. Haroldine Laws  Reason for Visit: post hospital HF follow up  HPI: [name] [name] is a 41 y.o.with systolic HF due to  NICM, hypertension and CKD stage II-III (baseline Cr 1.5-1.7).   He was first diagnosed with HF in 2012 and his EF recovered and was instructed to stop taking carvedilol, spironolactone, and lasix by previous cardiologist. Admitted to [facility] 07/13/73 for acute systolic heart failure. 12/31/11 ProBNP 3777. HIV nonreactive. Thyroid panel normal. Renal ultrasound was negative for obstruction. EF 20%. Discharge weight 193 lbs. Cath with normal coronaries.  Admitted 05/30/2015 with HTN urgency with troponin elevation likely consistent with myocardial strain/demand ischemia in the setting of medication non compliance, having run out 2-3 weeks ago and not refilling them. Placed back on his coreg, hydralazine, and spiro and was symptomatically stable on discharge. No ACE/ARB with CKD and does not tolerate nitrates with headaches. Discharge weight 193 lbs.  Underwent TEE in 9/18 with severe AI. EF 40-45%. Cath with normal coronaries. S/P AV repair 05/11/2017 with Dr. Roxy Manns.  PYP scan negative 10/18  Echo 02/13/19. EF 45% w/ moderate AI. EF out of range for ICD. Echo 3/21 EF 45% moderate AI. LV dimensions stable.   Seen in clinic 09/14/19 and complained of intermittent palpitations. Echo stable  Zio patch NSR w/ 1 brief run of NSVT (4 beats), 4 brief runs of SVT and rare PVCs < 1% burden.   ED visit 10/02/20 with volume overload. Reds Clip 42%. Instructed to increase lasix to 80 mg daily however the medication script remained 40 mg daily. POC US showed EF 35%.   Echo 10/22 EF 30-35% LV dilated. Mild-mod AI Mod-severe central MR.   Follow up 4/23, stable NYHA II. Plan to repeat echo to evaluate AI and MR.  Admitted 6/23 with  chest pain, found to be in a/c CHF. Diuresed with IV lasix. Wilder Glade stopped at patient's request. Echo showed EF down to 20-25%, severe AI. Plan for outpatient TEE to further evaluate valvular disease. Discharge wight 208 lbs.  Today he returns for post hospital HF follow up with his wife. Overall feeling fine. Occasional palpitations. Still has cough and has SOB when he coughs. Has been sleeping on cough for a couple weeks due to cough. Denies CP, dizziness, edema, or PND. Appetite ok. No fever or chills. Weight at home 211-212 pounds. Taking all medications.    Cardiac Studies: - Echo (2/14): EF 20-25% - Echo (3/16): EF 40%  - Echo (11/16): EF 20-25% - Echo (11/17): EF 45-50% - Echo (9/18): EF ~45% Severe AI  - Echo (12/18): EF 45-50% . Grade II DD - Echo (2019): EF 30-35%  Moderate aortic regurgitation. RV moderately reduced.  - Echo (8/20): EF 45%, moderate AI - Echo (3/21): EF 45%, Grade II DD, moderate AI  - POC [place] (3/22): EF 35% - Echo (10/22): EF 30-35%, LV dilated, mild-mod AI, moderate-severe central MR. - Echo (6/23): EF 20-25%, severe biventricular dysfunction, mild to moderate MR, severe AI (V max  2.9 m/s, MG 16 mmHG.)   Review of systems complete and found to be negative unless listed in HPI.   Past Medical History:  Diagnosis Date   Anxiety    CHF (congestive heart failure) (HCC)    Chronic systolic heart failure (HCC)    CKD (chronic kidney disease) stage 2, GFR  60-89 ml/min    Dyspnea    with value issues- "if I dont take my medication"   Essential hypertension, benign    Headache    History of pneumonia    Mitral regurgitation    Moderate   Noncompliance    Nonischemic cardiomyopathy ([place])    Normal coronaries May 2012, LVEF < 20%   Pneumonia 2011   S/P aortic valve repair 05/11/2017   Complex valvuloplasty including plication of left coronary leaflet and 9m Biostable HAART ring annuloplasty   Current Outpatient Medications  Medication Sig Dispense  Refill   acetaminophen (TYLENOL) 500 MG tablet Take 1,000 mg by mouth every 6 (six) hours as needed for moderate pain or headache.      albuterol (VENTOLIN HFA) 108 (90 Base) MCG/ACT inhaler Inhale 2 puffs into the lungs every 6 (six) hours as needed for wheezing or shortness of breath. 18 g 2   allopurinol (ZYLOPRIM) 300 MG tablet Take 1 tablet (300 mg total) by mouth daily. 30 tablet 5   ALPRAZolam (XANAX) 0.5 MG tablet Take 0.5 mg by mouth at bedtime.     carvedilol (COREG) 25 MG tablet Take 1 tablet (25 mg total) by mouth 2 (two) times daily with a meal. 180 tablet 3   furosemide (LASIX) 80 MG tablet Take 80 mg by mouth daily.     hydrALAZINE (APRESOLINE) 10 MG tablet Take 1 tablet (10 mg total) by mouth 3 (three) times daily. 90 tablet 3   Potassium Chloride ER 20 MEQ TBCR Take 20 mEq by mouth daily. Take daily while on Lasix/furosemide 90 tablet 2   sacubitril-valsartan (ENTRESTO) 97-103 MG Take 1 tablet by mouth 2 (two) times daily. 180 tablet 3   isosorbide mononitrate (ISMO) 10 MG tablet Take 1 tablet (10 mg total) by mouth 2 (two) times daily. (Patient not taking: Reported on 01/05/2022) 60 tablet 3   No current facility-administered medications for this encounter.   BP 94/62   Pulse 72   Wt 97.3 kg (214 lb 9.6 oz)   SpO2 97%   BMI 30.79 kg/m   Wt Readings from Last 3 Encounters:  01/05/22 97.3 kg (214 lb 9.6 oz)  12/25/21 94.4 kg (208 lb 1.8 oz)  12/03/21 99.3 kg (219 lb)   PHYSICAL EXAM: General:  NAD. No resp difficulty HEENT: Normal Neck: Supple. JVP 7-8. Carotids 2+ bilat; no bruits. No lymphadenopathy or thryomegaly appreciated. Cor: PMI nondisplaced. Regular rate & rhythm. No rubs, gallops, 2/6 MR, audible S2 Lungs: Clear Abdomen: Soft, nontender, nondistended. No hepatosplenomegaly. No bruits or masses. Good bowel sounds. Extremities: No cyanosis, clubbing, rash, edema Neuro: Alert & oriented x 3, cranial nerves grossly intact. Moves all 4 extremities w/o  difficulty. Affect pleasant.  ECG (personally reviewed): NSR 74 bpm  ReDs: 38%  ASSESSMENT & PLAN: 1) Chronic systolic HF: NICM,   - Echo ~20% in 2013. EF 45-50% in 2016. Suspect due to ongoing AI +/- HTN - s/p AoV repair 10/18. F/u echo 12/18 with EF 45-50% moderate AI - Echo (2019): EF 30-35% Mod AI.  - Echo (8/20): EF 45% (out of range for ICD) - Echo (3/21): EF 45% moderate AI. LV dimensions stable (LVIDd 6.5cm) - Echo (10/22): EF 30-35% LV dilated. Mild-mod AI Mod-severe central MR (LVIDd 6.0cm)  - Echo (6/23): EF 20-25%, severe biventricular dysfunction, mild to moderate MR, severe AI (V max  2.9 m/s, MG 16 mmHG.) - NYHA II - early III. Volume mildly up, ReDs 38%. - Increase Lasix to 80  q am/40 q pm x 3 days then back to Lasix 80 mg daily. Take extra 20 KCL x 3 days. - Continue Coreg 25 mg bid. - Continue Entresto 97/103 mg bid. - Stop hydralazine with low BP (did not tolerate Imdur with headaches). - Has not been on spiro due to hyperkalemia. - Did not tolerate Wilder Glade (says it made his legs swell). - Labs today.  2) S/P AV repair 05/01/2017 - LV remains down and severely dilated on last echo (6/23).  - Discussed with Dr. Haroldine Laws, arrange TEE.  - Consider referral to [facility] for valve surgery.  3) Mitral regurgitation - functional. Moderate to severe MR on echo 6/23. - MV peak gradient 9.1 mmHg, mean mitral valve gradient 2.0 mmHg. - TEE as above.  4) HTN   - Now on the low side. - Stop hydralazine.  5) CKD stage III   - baseline Cr 1.5-1.7  - Off SGLT2i. - Labs today.  6) Palpitations   - Improved. - Zio Patch ok.   7) SDOH - Has FMLA, missing work due to exacerbations and time off not covered under current FMLA wording. - Engage HFSW.  He was given a note today for his employer and his wife was given a note for hers.  Follow up with APP 2-3 weeks after TEE; keep follow up in 2 months with Dr. Haroldine Laws, as scheduled.  [employer], FNP  01/05/22

## 2022-01-05 NOTE — H&P (View-Only) (Signed)
Advanced Heart Failure Clinic Note   Patient ID: [name] [name], male   DOB: Oct 06, 1980, 41 y.o.   MRN: [id number]  Primary PCP: [facility]  HF Cardiologist: Dr. Haroldine Laws  Reason for Visit: post hospital HF follow up  HPI: [name] is a 41 y.o.with systolic HF due to  NICM, hypertension and CKD stage II-III (baseline Cr 1.5-1.7).   He was first diagnosed with HF in 2012 and his EF recovered and was instructed to stop taking carvedilol, spironolactone, and lasix by previous cardiologist. Admitted to [facility] 07/13/73 for acute systolic heart failure. 12/31/11 ProBNP 3777. HIV nonreactive. Thyroid panel normal. Renal ultrasound was negative for obstruction. EF 20%. Discharge weight 193 lbs. Cath with normal coronaries.  Admitted 05/30/2015 with HTN urgency with troponin elevation likely consistent with myocardial strain/demand ischemia in the setting of medication non compliance, having run out 2-3 weeks ago and not refilling them. Placed back on his coreg, hydralazine, and spiro and was symptomatically stable on discharge. No ACE/ARB with CKD and does not tolerate nitrates with headaches. Discharge weight 193 lbs.  Underwent TEE in 9/18 with severe AI. EF 40-45%. Cath with normal coronaries. S/P AV repair 05/11/2017 with Dr. Roxy Manns.  PYP scan negative 10/18  Echo 02/13/19. EF 45% w/ moderate AI. EF out of range for ICD. Echo 3/21 EF 45% moderate AI. LV dimensions stable.   Seen in clinic 09/14/19 and complained of intermittent palpitations. Echo stable  Zio patch NSR w/ 1 brief run of NSVT (4 beats), 4 brief runs of SVT and rare PVCs < 1% burden.   ED visit 10/02/20 with volume overload. Reds Clip 42%. Instructed to increase lasix to 80 mg daily however the medication script remained 40 mg daily. POC US showed EF 35%.   Echo 10/22 EF 30-35% LV dilated. Mild-mod AI Mod-severe central MR.   Follow up 4/23, stable NYHA II. Plan to repeat echo to evaluate AI and MR.  Admitted 6/23 with  chest pain, found to be in a/c CHF. Diuresed with IV lasix. Wilder Glade stopped at patient's request. Echo showed EF down to 20-25%, severe AI. Plan for outpatient TEE to further evaluate valvular disease. Discharge wight 208 lbs.  Today he returns for post hospital HF follow up with his wife. Overall feeling fine. Occasional palpitations. Still has cough and has SOB when he coughs. Has been sleeping on cough for a couple weeks due to cough. Denies CP, dizziness, edema, or PND. Appetite ok. No fever or chills. Weight at home 211-212 pounds. Taking all medications.    Cardiac Studies: - Echo (2/14): EF 20-25% - Echo (3/16): EF 40%  - Echo (11/16): EF 20-25% - Echo (11/17): EF 45-50% - Echo (9/18): EF ~45% Severe AI  - Echo (12/18): EF 45-50% . Grade II DD - Echo (2019): EF 30-35%  Moderate aortic regurgitation. RV moderately reduced.  - Echo (8/20): EF 45%, moderate AI - Echo (3/21): EF 45%, Grade II DD, moderate AI  - POC [place] (3/22): EF 35% - Echo (10/22): EF 30-35%, LV dilated, mild-mod AI, moderate-severe central MR. - Echo (6/23): EF 20-25%, severe biventricular dysfunction, mild to moderate MR, severe AI (V max  2.9 m/s, MG 16 mmHG.)   Review of systems complete and found to be negative unless listed in HPI.   Past Medical History:  Diagnosis Date   Anxiety    CHF (congestive heart failure) (HCC)    Chronic systolic heart failure (HCC)    CKD (chronic kidney disease) stage 2, GFR  60-89 ml/min    Dyspnea    with value issues- "if I dont take my medication"   Essential hypertension, benign    Headache    History of pneumonia    Mitral regurgitation    Moderate   Noncompliance    Nonischemic cardiomyopathy ([place])    Normal coronaries May 2012, LVEF < 20%   Pneumonia 2011   S/P aortic valve repair 05/11/2017   Complex valvuloplasty including plication of left coronary leaflet and 9m Biostable HAART ring annuloplasty   Current Outpatient Medications  Medication Sig Dispense  Refill   acetaminophen (TYLENOL) 500 MG tablet Take 1,000 mg by mouth every 6 (six) hours as needed for moderate pain or headache.      albuterol (VENTOLIN HFA) 108 (90 Base) MCG/ACT inhaler Inhale 2 puffs into the lungs every 6 (six) hours as needed for wheezing or shortness of breath. 18 g 2   allopurinol (ZYLOPRIM) 300 MG tablet Take 1 tablet (300 mg total) by mouth daily. 30 tablet 5   ALPRAZolam (XANAX) 0.5 MG tablet Take 0.5 mg by mouth at bedtime.     carvedilol (COREG) 25 MG tablet Take 1 tablet (25 mg total) by mouth 2 (two) times daily with a meal. 180 tablet 3   furosemide (LASIX) 80 MG tablet Take 80 mg by mouth daily.     hydrALAZINE (APRESOLINE) 10 MG tablet Take 1 tablet (10 mg total) by mouth 3 (three) times daily. 90 tablet 3   Potassium Chloride ER 20 MEQ TBCR Take 20 mEq by mouth daily. Take daily while on Lasix/furosemide 90 tablet 2   sacubitril-valsartan (ENTRESTO) 97-103 MG Take 1 tablet by mouth 2 (two) times daily. 180 tablet 3   isosorbide mononitrate (ISMO) 10 MG tablet Take 1 tablet (10 mg total) by mouth 2 (two) times daily. (Patient not taking: Reported on 01/05/2022) 60 tablet 3   No current facility-administered medications for this encounter.   BP 94/62   Pulse 72   Wt 97.3 kg (214 lb 9.6 oz)   SpO2 97%   BMI 30.79 kg/m   Wt Readings from Last 3 Encounters:  01/05/22 97.3 kg (214 lb 9.6 oz)  12/25/21 94.4 kg (208 lb 1.8 oz)  12/03/21 99.3 kg (219 lb)   PHYSICAL EXAM: General:  NAD. No resp difficulty HEENT: Normal Neck: Supple. JVP 7-8. Carotids 2+ bilat; no bruits. No lymphadenopathy or thryomegaly appreciated. Cor: PMI nondisplaced. Regular rate & rhythm. No rubs, gallops, 2/6 MR, audible S2 Lungs: Clear Abdomen: Soft, nontender, nondistended. No hepatosplenomegaly. No bruits or masses. Good bowel sounds. Extremities: No cyanosis, clubbing, rash, edema Neuro: Alert & oriented x 3, cranial nerves grossly intact. Moves all 4 extremities w/o  difficulty. Affect pleasant.  ECG (personally reviewed): NSR 74 bpm  ReDs: 38%  ASSESSMENT & PLAN: 1) Chronic systolic HF: NICM,   - Echo ~20% in 2013. EF 45-50% in 2016. Suspect due to ongoing AI +/- HTN - s/p AoV repair 10/18. F/u echo 12/18 with EF 45-50% moderate AI - Echo (2019): EF 30-35% Mod AI.  - Echo (8/20): EF 45% (out of range for ICD) - Echo (3/21): EF 45% moderate AI. LV dimensions stable (LVIDd 6.5cm) - Echo (10/22): EF 30-35% LV dilated. Mild-mod AI Mod-severe central MR (LVIDd 6.0cm)  - Echo (6/23): EF 20-25%, severe biventricular dysfunction, mild to moderate MR, severe AI (V max  2.9 m/s, MG 16 mmHG.) - NYHA II - early III. Volume mildly up, ReDs 38%. - Increase Lasix to 80  q am/40 q pm x 3 days then back to Lasix 80 mg daily. Take extra 20 KCL x 3 days. - Continue Coreg 25 mg bid. - Continue Entresto 97/103 mg bid. - Stop hydralazine with low BP (did not tolerate Imdur with headaches). - Has not been on spiro due to hyperkalemia. - Did not tolerate Wilder Glade (says it made his legs swell). - Labs today.  2) S/P AV repair 05/01/2017 - LV remains down and severely dilated on last echo (6/23).  - Discussed with Dr. Haroldine Laws, arrange TEE.  - Consider referral to [facility] for valve surgery.  3) Mitral regurgitation - functional. Moderate to severe MR on echo 6/23. - MV peak gradient 9.1 mmHg, mean mitral valve gradient 2.0 mmHg. - TEE as above.  4) HTN   - Now on the low side. - Stop hydralazine.  5) CKD stage III   - baseline Cr 1.5-1.7  - Off SGLT2i. - Labs today.  6) Palpitations   - Improved. - Zio Patch ok.   7) SDOH - Has FMLA, missing work due to exacerbations and time off not covered under current FMLA wording. - Engage HFSW.  He was given a note today for his employer and his wife was given a note for hers.  Follow up with APP 2-3 weeks after TEE; keep follow up in 2 months with Dr. Haroldine Laws, as scheduled.  [employer], FNP  01/05/22

## 2022-01-08 ENCOUNTER — Encounter (HOSPITAL_COMMUNITY): Payer: Self-pay | Admitting: Internal Medicine

## 2022-01-08 NOTE — Progress Notes (Signed)
Attempted to obtain medical history via telephone, unable to reach at this time. HIPAA compliant voicemail message left requesting return call to pre surgical testing department. 

## 2022-01-16 ENCOUNTER — Telehealth (HOSPITAL_COMMUNITY): Payer: Self-pay | Admitting: *Deleted

## 2022-01-19 ENCOUNTER — Other Ambulatory Visit (HOSPITAL_COMMUNITY): Payer: Self-pay | Admitting: *Deleted

## 2022-01-19 DIAGNOSIS — I5022 Chronic systolic (congestive) heart failure: Secondary | ICD-10-CM

## 2022-01-19 DIAGNOSIS — Z9889 Other specified postprocedural states: Secondary | ICD-10-CM

## 2022-01-19 NOTE — Anesthesia Preprocedure Evaluation (Signed)
Anesthesia Evaluation  Patient identified by MRN, date of birth, ID band Patient awake    Reviewed: Allergy & Precautions, NPO status , Patient's Chart, lab work & pertinent test results  Airway Mallampati: II  TM Distance: >3 FB Neck ROM: Full    Dental no notable dental hx. (+) Teeth Intact, Dental Advisory Given   Pulmonary neg pulmonary ROS,    Pulmonary exam normal breath sounds clear to auscultation       Cardiovascular hypertension, +CHF  Normal cardiovascular exam+ Valvular Problems/Murmurs (S/P AVR)  Rhythm:Regular Rate:Normal  nonischemic cardiomyopathy  12/23/21 Echo 1. Left ventricular ejection fraction, by estimation, is 20 to 25%. The  left ventricle has severely decreased function. The left ventricle  demonstrates global hypokinesis. The left ventricular internal cavity size  was severely dilated. Indeterminate  diastolic filling due to E-A fusion.  2. Right ventricular systolic function is severely reduced. The right  ventricular size is moderately enlarged. There is normal pulmonary artery  systolic pressure. The estimated right ventricular systolic pressure is  42.8 mmHg.  3. Left atrial size was severely dilated.  4. Right atrial size was severely dilated.  5. The mitral valve is grossly normal. Moderate to severe mitral valve  regurgitation. No evidence of mitral stenosis.  6. S/p Aortic Valve Repair with 19 mm annuloplasty ring (05/11/2017).  There is residual aortic regurgitation that is severe and eccentric. V max  2.9 m/s, MG 16 mmHG. Suspect this is related to AI. Recommend TEE for  further characterization. The aortic  valve has been repaired/replaced. Aortic valve regurgitation is severe.  There is a 19 mm valve present in the aortic position. Procedure Date:  05/11/2017.  7. Aortic dilatation noted. Aneurysm of the ascending aorta, measuring 47  mm. There is severe dilatation of the aortic  root, measuring 44 mm.  8. The inferior vena cava is normal in size with <50% respiratory  variability, suggesting right atrial pressure of 8 mmHg.    Neuro/Psych negative neurological ROS     GI/Hepatic   Endo/Other    Renal/GU      Musculoskeletal   Abdominal   Peds  Hematology   Anesthesia Other Findings   Reproductive/Obstetrics                            Anesthesia Physical Anesthesia Plan  ASA: 4  Anesthesia Plan: MAC   Post-op Pain Management:    Induction:   PONV Risk Score and Plan: Treatment may vary due to age or medical condition  Airway Management Planned: Natural Airway and Nasal Cannula  Additional Equipment: None  Intra-op Plan:   Post-operative Plan:   Informed Consent: I have reviewed the patients History and Physical, chart, labs and discussed the procedure including the risks, benefits and alternatives for the proposed anesthesia with the patient or authorized representative who has indicated his/her understanding and acceptance.     Dental advisory given  Plan Discussed with:   Anesthesia Plan Comments: (20-25% EF)       Anesthesia Quick Evaluation

## 2022-01-20 ENCOUNTER — Encounter (HOSPITAL_COMMUNITY)
Admission: RE | Disposition: A | Discharge status: Home / Self Care | Payer: Self-pay | Source: Home / Self Care | Attending: Internal Medicine

## 2022-01-20 ENCOUNTER — Ambulatory Visit (HOSPITAL_BASED_OUTPATIENT_CLINIC_OR_DEPARTMENT_OTHER)
Admission: RE | Admit: 2022-01-20 | Discharge: 2022-01-20 | Disposition: A | Discharge status: Home / Self Care | Payer: BC Managed Care – PPO | Source: Ambulatory Visit | Attending: Internal Medicine | Admitting: Internal Medicine

## 2022-01-20 ENCOUNTER — Ambulatory Visit (HOSPITAL_COMMUNITY)
Admission: RE | Admit: 2022-01-20 | Discharge: 2022-01-20 | Disposition: A | Discharge status: Home / Self Care | Payer: BC Managed Care – PPO | Attending: Internal Medicine | Admitting: Internal Medicine

## 2022-01-20 ENCOUNTER — Encounter (HOSPITAL_COMMUNITY): Payer: Self-pay | Admitting: Internal Medicine

## 2022-01-20 ENCOUNTER — Other Ambulatory Visit: Payer: Self-pay

## 2022-01-20 ENCOUNTER — Ambulatory Visit (HOSPITAL_COMMUNITY): Payer: BC Managed Care – PPO | Admitting: Anesthesiology

## 2022-01-20 ENCOUNTER — Other Ambulatory Visit (HOSPITAL_COMMUNITY): Payer: Self-pay

## 2022-01-20 DIAGNOSIS — I340 Nonrheumatic mitral (valve) insufficiency: Secondary | ICD-10-CM

## 2022-01-20 DIAGNOSIS — I5022 Chronic systolic (congestive) heart failure: Secondary | ICD-10-CM | POA: Insufficient documentation

## 2022-01-20 DIAGNOSIS — N183 Chronic kidney disease, stage 3 unspecified: Secondary | ICD-10-CM | POA: Insufficient documentation

## 2022-01-20 DIAGNOSIS — I361 Nonrheumatic tricuspid (valve) insufficiency: Secondary | ICD-10-CM

## 2022-01-20 DIAGNOSIS — I509 Heart failure, unspecified: Secondary | ICD-10-CM | POA: Diagnosis not present

## 2022-01-20 DIAGNOSIS — Z9889 Other specified postprocedural states: Secondary | ICD-10-CM

## 2022-01-20 DIAGNOSIS — I083 Combined rheumatic disorders of mitral, aortic and tricuspid valves: Secondary | ICD-10-CM | POA: Diagnosis not present

## 2022-01-20 DIAGNOSIS — I428 Other cardiomyopathies: Secondary | ICD-10-CM | POA: Insufficient documentation

## 2022-01-20 DIAGNOSIS — Z79899 Other long term (current) drug therapy: Secondary | ICD-10-CM | POA: Insufficient documentation

## 2022-01-20 DIAGNOSIS — I130 Hypertensive heart and chronic kidney disease with heart failure and stage 1 through stage 4 chronic kidney disease, or unspecified chronic kidney disease: Secondary | ICD-10-CM | POA: Diagnosis not present

## 2022-01-20 DIAGNOSIS — I110 Hypertensive heart disease with heart failure: Secondary | ICD-10-CM | POA: Diagnosis not present

## 2022-01-20 HISTORY — PX: TEE WITHOUT CARDIOVERSION: SHX5443

## 2022-01-20 LAB — ECHO TEE
AV Mean grad: 9 mmHg
AV Peak grad: 17.8 mmHg
Ao pk vel: 2.11 m/s
MV M vel: 4.45 m/s
MV Peak grad: 79.2 mmHg
P 1/2 time: 423 msec
Radius: 0.55 cm

## 2022-01-20 SURGERY — ECHOCARDIOGRAM, TRANSESOPHAGEAL
Anesthesia: Monitor Anesthesia Care

## 2022-01-20 MED ORDER — FUROSEMIDE 10 MG/ML IJ SOLN
80.0000 mg | Freq: Once | INTRAMUSCULAR | Status: AC
  Administered 2022-01-20: 80 mg via INTRAVENOUS

## 2022-01-20 MED ORDER — PHENYLEPHRINE HCL-NACL 20-0.9 MG/250ML-% IV SOLN
INTRAVENOUS | Status: DC | PRN
  Administered 2022-01-20: 40 ug/min via INTRAVENOUS

## 2022-01-20 MED ORDER — LIDOCAINE 2% (20 MG/ML) 5 ML SYRINGE
INTRAMUSCULAR | Status: DC | PRN
  Administered 2022-01-20: 100 mg via INTRAVENOUS

## 2022-01-20 MED ORDER — FENTANYL CITRATE (PF) 100 MCG/2ML IJ SOLN
INTRAMUSCULAR | Status: DC | PRN
Start: 2022-01-20 — End: 2022-01-20
  Administered 2022-01-20 (×4): 25 ug via INTRAVENOUS

## 2022-01-20 MED ORDER — PHENYLEPHRINE 80 MCG/ML (10ML) SYRINGE FOR IV PUSH (FOR BLOOD PRESSURE SUPPORT)
PREFILLED_SYRINGE | INTRAVENOUS | Status: DC | PRN
  Administered 2022-01-20: 5 ug via INTRAVENOUS

## 2022-01-20 MED ORDER — FUROSEMIDE 10 MG/ML IJ SOLN
INTRAMUSCULAR | Status: AC
  Filled 2022-01-20: qty 8

## 2022-01-20 MED ORDER — SODIUM CHLORIDE 0.9 % IV SOLN: INTRAVENOUS | Status: DC | PRN

## 2022-01-20 MED ORDER — MIDAZOLAM HCL 2 MG/2ML IJ SOLN
INTRAMUSCULAR | Status: DC | PRN
  Administered 2022-01-20: 2 mg via INTRAVENOUS

## 2022-01-20 MED ORDER — PROPOFOL 10 MG/ML IV BOLUS
INTRAVENOUS | Status: DC | PRN
  Administered 2022-01-20: 20 mg via INTRAVENOUS

## 2022-01-20 MED ORDER — PROPOFOL 500 MG/50ML IV EMUL
INTRAVENOUS | Status: DC | PRN
  Administered 2022-01-20: 50 ug/kg/min via INTRAVENOUS

## 2022-01-20 MED ORDER — FENTANYL CITRATE (PF) 100 MCG/2ML IJ SOLN
INTRAMUSCULAR | Status: AC
  Filled 2022-01-20: qty 2

## 2022-01-20 MED ORDER — MIDAZOLAM HCL 2 MG/2ML IJ SOLN
INTRAMUSCULAR | Status: AC
  Filled 2022-01-20: qty 2

## 2022-01-20 NOTE — Transfer of Care (Signed)
Immediate Anesthesia Transfer of Care Note  Patient: AVYAY COGER  Procedure(s) Performed: TRANSESOPHAGEAL ECHOCARDIOGRAM (TEE)  Patient Location: Short Stay  Anesthesia Type:MAC  Level of Consciousness: drowsy  Airway & Oxygen Therapy: Patient Spontanous Breathing and Patient connected to face mask oxygen  Post-op Assessment: Report given to RN and Post -op Vital signs reviewed and stable  Post vital signs: Reviewed and stable  Last Vitals:  Vitals Value Taken Time  BP 106/75   Temp    Pulse 71 01/20/22 1000  Resp 25 01/20/22 1000  SpO2 100 % 01/20/22 1000  Vitals shown include unvalidated device data.  Last Pain:  Vitals:   01/20/22 0726  PainSc: 0-No pain         Complications: No notable events documented.

## 2022-01-20 NOTE — Anesthesia Procedure Notes (Signed)
Procedure Name: General with mask airway Date/Time: 01/20/2022 9:12 AM  Performed by: Erick Colace, CRNAPre-anesthesia Checklist: Patient identified, Emergency Drugs available, Suction available and Patient being monitored Patient Re-evaluated:Patient Re-evaluated prior to induction Oxygen Delivery Method: Nasal cannula Preoxygenation: Pre-oxygenation with 100% oxygen Induction Type: IV induction Airway Equipment and Method: Bite block Dental Injury: Teeth and Oropharynx as per pre-operative assessment

## 2022-01-20 NOTE — Anesthesia Postprocedure Evaluation (Signed)
Anesthesia Post Note  Patient: [name] [name]  Procedure(s) Performed: TRANSESOPHAGEAL ECHOCARDIOGRAM (TEE)     Patient location during evaluation: Endoscopy Anesthesia Type: MAC Level of consciousness: awake and alert Pain management: pain level controlled Vital Signs Assessment: post-procedure vital signs reviewed and stable Respiratory status: spontaneous breathing, nonlabored ventilation, respiratory function stable and patient connected to nasal cannula oxygen Cardiovascular status: blood pressure returned to baseline and stable Postop Assessment: no apparent nausea or vomiting Anesthetic complications: no   No notable events documented.  Last Vitals:  Vitals:   01/20/22 1020 01/20/22 1036  BP: 110/64 110/67  Pulse: 71 71  Resp: (!) 28 (!) 30  Temp:    SpO2: 95% 96%    Last Pain:  Vitals:   01/20/22 1010  TempSrc:   PainSc: 0-No pain                 Barnet Glasgow

## 2022-01-20 NOTE — Interval H&P Note (Signed)
History and Physical Interval Note:  01/20/2022 9:14 AM  [name] [name]  has presented today for surgery, with the diagnosis of AORTIC INSUFFICIENCY.  The various methods of treatment have been discussed with the patient and family. After consideration of risks, benefits and other options for treatment, the patient has consented to  Procedure(s): TRANSESOPHAGEAL ECHOCARDIOGRAM (TEE) (N/A) as a surgical intervention.  The patient's history has been reviewed, patient examined, no change in status, stable for surgery.  I have reviewed the patient's chart and labs.  Questions were answered to the patient's satisfaction.     Shirle Provencal

## 2022-01-20 NOTE — Discharge Instructions (Signed)

## 2022-01-20 NOTE — CV Procedure (Signed)
   TRANSESOPHAGEAL ECHOCARDIOGRAM   NAME:  [name] [name]   MRN: [id number] DOB:  [birth year]   ADMIT DATE: 01/20/2022  INDICATIONS:  Severe AI  PROCEDURE:   Informed consent was obtained prior to the procedure. The risks, benefits and alternatives for the procedure were discussed and the patient comprehended these risks.  Risks include, but are not limited to, cough, sore throat, vomiting, nausea, somnolence, esophageal and stomach trauma or perforation, bleeding, low blood pressure, aspiration, pneumonia, infection, trauma to the teeth and death.    After a procedural time-out, the oropharynx was anesthetized and the patient was sedated by the anesthesia service. The transesophageal probe was inserted in the esophagus and stomach without difficulty and multiple views were obtained.   FINDINGS:  LEFT VENTRICLE: EF = EF 20% global HK  RIGHT VENTRICLE: Severely HK  LEFT ATRIUM: Severely dilated  LEFT ATRIAL APPENDAGE: No clot  RIGHT ATRIUM: Severely dilated  AORTIC VALVE:  s/p repair. Severe central AI with Pressure 1/2 time ~ 420s  MITRAL VALVE:   Mild to moderate MR   TRICUSPID VALVE: Moderate TR  PULMONIC VALVE: Moderate PR  INTERATRIAL SEPTUM: Not visualized  PERICARDIUM: No effusion  DESCENDING AORTA: No significant plaque  Glori Bickers, MD  10:05 AM

## 2022-01-20 NOTE — Progress Notes (Signed)
  Echocardiogram Echocardiogram Transesophageal has been performed.  Darlina Sicilian M 01/20/2022, 11:21 AM

## 2022-01-21 ENCOUNTER — Other Ambulatory Visit (HOSPITAL_COMMUNITY): Payer: Self-pay | Admitting: Internal Medicine

## 2022-01-21 ENCOUNTER — Encounter (HOSPITAL_COMMUNITY): Payer: Self-pay | Admitting: Internal Medicine

## 2022-01-23 ENCOUNTER — Telehealth (HOSPITAL_COMMUNITY): Payer: Self-pay | Admitting: *Deleted

## 2022-01-23 NOTE — Telephone Encounter (Signed)
LATE ENTRY from 01/20/22: per Dr Haroldine Laws pt needs Furoscix daily x3 days and CPX arranged  Furoscix order form was faxed on 01/21/22  Called pt today to f/u, he states he has received 1 kit and is planning on using it tomorrow, we discussed directions, he states he has reviewed the instructions and feels confident in placing device. Advised the 1 was a free sample, we are working with insurance to get it covered and then'll he receive 6 kits in the mail. Advised will call him early next week to f/u, if any issues over weekend to call on-call provider or [place], he is agreeable.

## 2022-01-26 NOTE — Progress Notes (Signed)
  Patient markedly volume overloaded post TEE. Not responding well to escalation of home oral diuretics.   I gave him '80mg'$  IV lasix post TEE.  Will enroll in Furoscix program for home SQ lasix.   Will likely need transplant eval for recurrent AI and severe cardiomyopathy.  Plan CPX testing soon.   Glori Bickers, MD  5:17 PM

## 2022-01-26 NOTE — Telephone Encounter (Addendum)
Spoke w/pt, he successfully used Furoscix on 7/15, he states he diuresed well but still has edema in ft/ankles, he has not weighed the past couple of days. Has not received other Furoscix kits.  Called [employer], they need more info to get auth from insurance. Notes faxed into them, will f/u tomorrow

## 2022-01-27 NOTE — Telephone Encounter (Signed)
Furoscix approved under [employer] through 04/29/22, pt is aware and has spoke w/company they are sending out shipment

## 2022-01-30 NOTE — Telephone Encounter (Signed)
Attempted to call pt to check in and Left message to call back

## 2022-02-05 NOTE — Telephone Encounter (Signed)
Left message to call back  

## 2022-02-06 ENCOUNTER — Telehealth (HOSPITAL_COMMUNITY): Payer: Self-pay | Admitting: *Deleted

## 2022-02-06 NOTE — Telephone Encounter (Signed)
Pt left vm requesting a call back about fmla paperwork and swelling in legs and feet. I called pt back no answer.

## 2022-02-08 ENCOUNTER — Emergency Department (HOSPITAL_COMMUNITY): Payer: BC Managed Care – PPO

## 2022-02-08 ENCOUNTER — Other Ambulatory Visit: Payer: Self-pay

## 2022-02-08 ENCOUNTER — Inpatient Hospital Stay (HOSPITAL_COMMUNITY)
Admission: EM | Admit: 2022-02-08 | Discharge: 2022-02-12 | DRG: 291 | Disposition: A | Discharge status: Home Health | Payer: BC Managed Care – PPO | Attending: Family Medicine | Admitting: Family Medicine

## 2022-02-08 ENCOUNTER — Encounter (HOSPITAL_COMMUNITY): Payer: Self-pay

## 2022-02-08 DIAGNOSIS — I5022 Chronic systolic (congestive) heart failure: Secondary | ICD-10-CM | POA: Diagnosis not present

## 2022-02-08 DIAGNOSIS — I5043 Acute on chronic combined systolic (congestive) and diastolic (congestive) heart failure: Secondary | ICD-10-CM | POA: Diagnosis not present

## 2022-02-08 DIAGNOSIS — F419 Anxiety disorder, unspecified: Secondary | ICD-10-CM | POA: Diagnosis not present

## 2022-02-08 DIAGNOSIS — G4733 Obstructive sleep apnea (adult) (pediatric): Secondary | ICD-10-CM | POA: Diagnosis present

## 2022-02-08 DIAGNOSIS — I700 Atherosclerosis of aorta: Secondary | ICD-10-CM | POA: Diagnosis not present

## 2022-02-08 DIAGNOSIS — I77810 Thoracic aortic ectasia: Secondary | ICD-10-CM | POA: Diagnosis not present

## 2022-02-08 DIAGNOSIS — K761 Chronic passive congestion of liver: Secondary | ICD-10-CM | POA: Diagnosis present

## 2022-02-08 DIAGNOSIS — I5023 Acute on chronic systolic (congestive) heart failure: Secondary | ICD-10-CM | POA: Diagnosis not present

## 2022-02-08 DIAGNOSIS — I351 Nonrheumatic aortic (valve) insufficiency: Secondary | ICD-10-CM | POA: Diagnosis not present

## 2022-02-08 DIAGNOSIS — R570 Cardiogenic shock: Secondary | ICD-10-CM

## 2022-02-08 DIAGNOSIS — I130 Hypertensive heart and chronic kidney disease with heart failure and stage 1 through stage 4 chronic kidney disease, or unspecified chronic kidney disease: Secondary | ICD-10-CM | POA: Diagnosis not present

## 2022-02-08 DIAGNOSIS — E1122 Type 2 diabetes mellitus with diabetic chronic kidney disease: Secondary | ICD-10-CM | POA: Diagnosis not present

## 2022-02-08 DIAGNOSIS — I340 Nonrheumatic mitral (valve) insufficiency: Secondary | ICD-10-CM | POA: Diagnosis not present

## 2022-02-08 DIAGNOSIS — R079 Chest pain, unspecified: Secondary | ICD-10-CM | POA: Diagnosis not present

## 2022-02-08 DIAGNOSIS — N179 Acute kidney failure, unspecified: Secondary | ICD-10-CM | POA: Diagnosis not present

## 2022-02-08 DIAGNOSIS — N2889 Other specified disorders of kidney and ureter: Secondary | ICD-10-CM | POA: Diagnosis not present

## 2022-02-08 DIAGNOSIS — K5900 Constipation, unspecified: Secondary | ICD-10-CM | POA: Diagnosis present

## 2022-02-08 DIAGNOSIS — E118 Type 2 diabetes mellitus with unspecified complications: Secondary | ICD-10-CM | POA: Diagnosis not present

## 2022-02-08 DIAGNOSIS — K4090 Unilateral inguinal hernia, without obstruction or gangrene, not specified as recurrent: Secondary | ICD-10-CM | POA: Diagnosis present

## 2022-02-08 DIAGNOSIS — N183 Chronic kidney disease, stage 3 unspecified: Secondary | ICD-10-CM | POA: Diagnosis not present

## 2022-02-08 DIAGNOSIS — Z952 Presence of prosthetic heart valve: Secondary | ICD-10-CM

## 2022-02-08 DIAGNOSIS — I50811 Acute right heart failure: Secondary | ICD-10-CM

## 2022-02-08 DIAGNOSIS — N1832 Chronic kidney disease, stage 3b: Secondary | ICD-10-CM | POA: Diagnosis not present

## 2022-02-08 DIAGNOSIS — I493 Ventricular premature depolarization: Secondary | ICD-10-CM | POA: Diagnosis present

## 2022-02-08 DIAGNOSIS — I428 Other cardiomyopathies: Secondary | ICD-10-CM | POA: Diagnosis present

## 2022-02-08 DIAGNOSIS — I5082 Biventricular heart failure: Secondary | ICD-10-CM | POA: Diagnosis present

## 2022-02-08 DIAGNOSIS — R109 Unspecified abdominal pain: Secondary | ICD-10-CM | POA: Diagnosis not present

## 2022-02-08 DIAGNOSIS — I509 Heart failure, unspecified: Secondary | ICD-10-CM | POA: Diagnosis not present

## 2022-02-08 DIAGNOSIS — E875 Hyperkalemia: Secondary | ICD-10-CM | POA: Diagnosis present

## 2022-02-08 DIAGNOSIS — I10 Essential (primary) hypertension: Secondary | ICD-10-CM | POA: Diagnosis not present

## 2022-02-08 DIAGNOSIS — R601 Generalized edema: Secondary | ICD-10-CM | POA: Diagnosis not present

## 2022-02-08 DIAGNOSIS — K5730 Diverticulosis of large intestine without perforation or abscess without bleeding: Secondary | ICD-10-CM | POA: Diagnosis not present

## 2022-02-08 DIAGNOSIS — Z8249 Family history of ischemic heart disease and other diseases of the circulatory system: Secondary | ICD-10-CM | POA: Diagnosis not present

## 2022-02-08 DIAGNOSIS — N1831 Chronic kidney disease, stage 3a: Secondary | ICD-10-CM | POA: Diagnosis not present

## 2022-02-08 DIAGNOSIS — Z79899 Other long term (current) drug therapy: Secondary | ICD-10-CM

## 2022-02-08 DIAGNOSIS — I080 Rheumatic disorders of both mitral and aortic valves: Secondary | ICD-10-CM | POA: Diagnosis present

## 2022-02-08 DIAGNOSIS — Z91148 Patient's other noncompliance with medication regimen for other reason: Secondary | ICD-10-CM

## 2022-02-08 DIAGNOSIS — Z91199 Patient's noncompliance with other medical treatment and regimen due to unspecified reason: Secondary | ICD-10-CM | POA: Diagnosis not present

## 2022-02-08 DIAGNOSIS — Z823 Family history of stroke: Secondary | ICD-10-CM

## 2022-02-08 LAB — BASIC METABOLIC PANEL
Anion gap: 7 (ref 5–15)
BUN: 31 mg/dL — ABNORMAL HIGH (ref 6–20)
CO2: 21 mmol/L — ABNORMAL LOW (ref 22–32)
Calcium: 8.7 mg/dL — ABNORMAL LOW (ref 8.9–10.3)
Chloride: 108 mmol/L (ref 98–111)
Creatinine, Ser: 2.63 mg/dL — ABNORMAL HIGH (ref 0.61–1.24)
GFR, Estimated: 31 mL/min — ABNORMAL LOW (ref >60)
Glucose, Bld: 125 mg/dL — ABNORMAL HIGH (ref 70–99)
Potassium: 4.1 mmol/L (ref 3.5–5.1)
Sodium: 136 mmol/L (ref 135–145)

## 2022-02-08 LAB — HEPATIC FUNCTION PANEL
ALT: 22 U/L (ref 0–44)
AST: 25 U/L (ref 15–41)
Albumin: 2.9 g/dL — ABNORMAL LOW (ref 3.5–5.0)
Alkaline Phosphatase: 65 U/L (ref 38–126)
Bilirubin, Direct: 0.4 mg/dL — ABNORMAL HIGH (ref 0.0–0.2)
Indirect Bilirubin: 1 mg/dL — ABNORMAL HIGH (ref 0.3–0.9)
Total Bilirubin: 1.4 mg/dL — ABNORMAL HIGH (ref 0.3–1.2)
Total Protein: 5.3 g/dL — ABNORMAL LOW (ref 6.5–8.1)

## 2022-02-08 LAB — CBC
HCT: 41.4 % (ref 39.0–52.0)
Hemoglobin: 13.3 g/dL (ref 13.0–17.0)
MCH: 27.4 pg (ref 26.0–34.0)
MCHC: 32.1 g/dL (ref 30.0–36.0)
MCV: 85.4 fL (ref 80.0–100.0)
Platelets: 226 10*3/uL (ref 150–400)
RBC: 4.85 MIL/uL (ref 4.22–5.81)
RDW: 17.9 % — ABNORMAL HIGH (ref 11.5–15.5)
WBC: 5.8 10*3/uL (ref 4.0–10.5)
nRBC: 0 % (ref 0.0–0.2)

## 2022-02-08 LAB — BRAIN NATRIURETIC PEPTIDE: B Natriuretic Peptide: 3036.5 pg/mL — ABNORMAL HIGH (ref 0.0–100.0)

## 2022-02-08 LAB — TROPONIN I (HIGH SENSITIVITY): Troponin I (High Sensitivity): 48 ng/L — ABNORMAL HIGH (ref <18)

## 2022-02-08 LAB — LIPASE, BLOOD: Lipase: 32 U/L (ref 11–51)

## 2022-02-08 MED ORDER — FUROSEMIDE 10 MG/ML IJ SOLN: 80.0000 mg | Freq: Every day | INTRAMUSCULAR | Status: DC

## 2022-02-08 MED ORDER — POTASSIUM CHLORIDE CRYS ER 20 MEQ PO TBCR
20.0000 meq | EXTENDED_RELEASE_TABLET | Freq: Every day | ORAL | Status: DC
Start: 2022-02-08 — End: 2022-02-12
  Administered 2022-02-08 – 2022-02-12 (×5): 20 meq via ORAL
  Filled 2022-02-08 (×5): qty 1

## 2022-02-08 MED ORDER — FUROSEMIDE 10 MG/ML IJ SOLN
80.0000 mg | Freq: Once | INTRAMUSCULAR | Status: AC
  Administered 2022-02-08: 80 mg via INTRAVENOUS
  Filled 2022-02-08: qty 8

## 2022-02-08 MED ORDER — ALLOPURINOL 300 MG PO TABS
300.0000 mg | ORAL_TABLET | Freq: Every day | ORAL | Status: DC
  Administered 2022-02-08 – 2022-02-12 (×5): 300 mg via ORAL
  Filled 2022-02-08 (×3): qty 1
  Filled 2022-02-08: qty 3
  Filled 2022-02-08: qty 1

## 2022-02-08 MED ORDER — CARVEDILOL 25 MG PO TABS
25.0000 mg | ORAL_TABLET | Freq: Two times a day (BID) | ORAL | Status: DC
  Administered 2022-02-08 – 2022-02-09 (×2): 25 mg via ORAL
  Filled 2022-02-08 (×2): qty 1

## 2022-02-08 MED ORDER — SACUBITRIL-VALSARTAN 97-103 MG PO TABS
1.0000 | ORAL_TABLET | Freq: Two times a day (BID) | ORAL | Status: DC
  Filled 2022-02-08 (×2): qty 1

## 2022-02-08 MED ORDER — PNEUMOCOCCAL 20-VAL CONJ VACC 0.5 ML IM SUSY
0.5000 mL | PREFILLED_SYRINGE | INTRAMUSCULAR | Status: DC
  Filled 2022-02-08: qty 0.5

## 2022-02-08 MED ORDER — FUROSEMIDE 10 MG/ML IJ SOLN
80.0000 mg | Freq: Two times a day (BID) | INTRAMUSCULAR | Status: DC
  Administered 2022-02-08 – 2022-02-10 (×4): 80 mg via INTRAVENOUS
  Filled 2022-02-08 (×4): qty 8

## 2022-02-08 MED ORDER — ENOXAPARIN SODIUM 30 MG/0.3ML IJ SOSY
30.0000 mg | PREFILLED_SYRINGE | INTRAMUSCULAR | Status: DC
  Administered 2022-02-09: 30 mg via SUBCUTANEOUS
  Filled 2022-02-08: qty 0.3

## 2022-02-08 MED ORDER — SACUBITRIL-VALSARTAN 97-103 MG PO TABS: 1.0000 | ORAL_TABLET | Freq: Two times a day (BID) | ORAL | Status: DC

## 2022-02-08 MED ORDER — POLYETHYLENE GLYCOL 3350 17 G PO PACK
17.0000 g | PACK | Freq: Every day | ORAL | Status: DC
Start: 2022-02-08 — End: 2022-02-12
  Administered 2022-02-08 – 2022-02-11 (×3): 17 g via ORAL
  Filled 2022-02-08 (×5): qty 1

## 2022-02-08 MED ORDER — ALPRAZOLAM 0.5 MG PO TABS
0.5000 mg | ORAL_TABLET | Freq: Once | ORAL | Status: AC
Start: 2022-02-09 — End: 2022-02-08
  Administered 2022-02-08: 0.5 mg via ORAL
  Filled 2022-02-08: qty 1

## 2022-02-08 MED ORDER — ALBUTEROL SULFATE (2.5 MG/3ML) 0.083% IN NEBU: 3.0000 mL | INHALATION_SOLUTION | Freq: Four times a day (QID) | RESPIRATORY_TRACT | Status: DC | PRN

## 2022-02-08 MED ORDER — SENNOSIDES-DOCUSATE SODIUM 8.6-50 MG PO TABS
1.0000 | ORAL_TABLET | Freq: Every day | ORAL | Status: DC
  Administered 2022-02-09 – 2022-02-11 (×3): 1 via ORAL
  Filled 2022-02-08 (×3): qty 1

## 2022-02-08 NOTE — Progress Notes (Signed)
FMTS Brief Progress Note  S:Patient has no acute complaints. He reports that he is urinating now more with the IV lasix that he was at home. He still feels significantly fluid overloaded but feels it is starting to improve   O: BP 105/63 (BP Location: Left Arm)   Pulse 85   Temp 97.6 F (36.4 C) (Oral)   Resp 18   Ht '5\' 10"'$  (1.778 m)   Wt 105 kg   SpO2 100%   BMI 33.21 kg/m   General: NAD, supine in bed CV: RRR, well perfused distally, 2-3+ pitting edema in BLE Respiratory: breathing comfortably on room air,CTAB Abdomen: soft, non-tender, mildly distended  A/P: Acute Systolic CHF exacerbation UOP charted since admission is 1025m. - Plan per H&P without changes - Orders reviewed. Labs for AM ordered, which was adjusted as needed.   LRise Patience DO 02/08/2022, 10:35 PM PGY-3, [facility] Night Resident  Please page [phone] questions.

## 2022-02-08 NOTE — Assessment & Plan Note (Addendum)
Stable on RA. Volume overloaded on exam and on imaging. BNP markedly elevated. Given need for aggressive diuresis, will admit to FMTS cardiac telemetry, attending Dr. Gwendlyn Deutscher. -IV lasix 80 mg daily -Supplemental O2 as needed, continuous pulse ox -Strict I/Os, daily weights -Continue home coreg 25 mg BID -Hold home entresto for now given AKI on CKD3 -Consider addition of SGLT2 at d/c, though failed trial previously -AM CBC, CMP -Consult cardiology given close follow up with Dr. Sung Amabile, HF clinic

## 2022-02-08 NOTE — ED Triage Notes (Signed)
Patient complains of increasing abdominal swelling and bilateral leg swelling with exertional sob x 3-4 weeks. Patient concerned may be related to hernia as well. Constipation and decreased output. Chf and taking meds as prescribed. Abdomen firm and distended. Denies CP

## 2022-02-08 NOTE — Hospital Course (Addendum)
[name] [name] is a 41 y.o. male who presented with abdominal pain, SOB, and leg swelling and found to have CHF exacerbation. PMH includes CHF s/p aortic valvuloplasty for insufficiency, CKD3, HTN, mitral regurgitation, and OSA.  His hospital course is listed below by problem set, for additional information refer to the H&P.   CHF exacerbation (HCC) Presented with abd pain and distention, SOB, 3+ pitting edema to LLE. VSS. Had been taking furoscix SQ lasix 80 mg daily as scheduled. Updated echo before admission in 01/2022 showed decreased EF to 20-25%. He was given IV lasix 80 mg BID with metolazone x1. Cards consulted with recs to consider transplant in future. Started on Milrinone drip due to low co-ox. Coreg dc'd due to low cardiac output. Was noted to have PVCs and NSVT on telemetry. He was ultimately discharged on milrinone drip to be continued outpatient. Since his lifevest was denied by insurance, and the appeal was still in process at the time of d/c, he was discharged with Amiodarone (see instructions below). RHC was deferred this admission given his workup for heart transplantation at [facility] will include this. His weight at the time of d/c was 209 lbs which is close to his d/c weight from previous hospitalization and thought to be dry weight. During hospitalization he had a net UOP of 14.162 L, weight decreased 22 lbs (231 lbs on admit).   Sent home with: Amiodarone 200 mg BID x 10 days then 200 mg daily Potassium chloride daily Milrinone .375 mcg/kg/min continuous infusion Torsemide 60mg  daily   AKI on CKD3 Cr elevated to 2.6 (baseline around 1.6) on admission, likely d/t cardiorenal syndrome. Cr improved to 1.69 by discharge.   Constipation Reported little BM before admission. Given miralax, senna. Symptoms improved by discharge.   Chronic and stable problems: -Anxiety: continued home xanax 0.5 mg qhs -HTN: BP stable over admission with diuresis and decreased coreg -R Inguinal  hernia: nonpainful on admission, was going to follow up with surgery though did not given recent birth of twins  Issues for follow up: Per cardiology, pt needs follow up with [facility] for transplant eval and RHC.  F/u lifevest insurance appeal Entresto held during hospital stay due to AKI. Consider restarting as needed Ensure PICC hygiene and care instructions for milrinone  Cardiogenic cirrhosis found on CT study. LFTs within normal limits. Recommend PCP to follow up.  Consider surgical referral for fat-containing inguinal hernia A1c 6.6 while admitted, just barely within diabetic range. Known history of pre-diabetes. Recommend PCP to recheck when clinically improved and treat accordingly. Consider starting SGLT-2 given history of CHF. Recheck BMP for Cr. Baseline appears to be 1.6-1.8.

## 2022-02-08 NOTE — ED Notes (Signed)
Pt c/o lower chest discomfort bi-laterally

## 2022-02-08 NOTE — Consult Note (Addendum)
Cardiology Consultation:   Patient ID: LEM PEARY MRN: [id number]; DOB: [birth year]  Admit date: 02/08/2022 Date of Consult: 02/08/2022  PCP:  Sharilyn Sites, MD   [facility] Providers Cardiologist:  Glori Bickers, MD        Patient Profile:   [name] [name] is a 41 y.o. male with a hx of systolic HF due to  NICM, hypertension and CKD stage II-III (baseline Cr 1.5-1.7),  who is being seen 02/08/2022 for the evaluation of CHF at the request of Dr Gwendlyn Deutscher.  History of Present Illness:   [name] [name] was first diagnosed with HF in 2012 and his EF recovered and was instructed to stop taking carvedilol, spironolactone, and lasix by previous cardiologist. Admitted to [facility] 02/20/16 for acute systolic heart failure. 12/31/11 ProBNP 3777. HIV nonreactive. Thyroid panel normal. Renal ultrasound was negative for obstruction. EF 20%. Discharge weight 193 lbs. Cath with normal coronaries.   Admitted 05/30/2015 with HTN urgency with troponin elevation likely consistent with myocardial strain/demand ischemia in the setting of medication non compliance, having run out 2-3 weeks ago and not refilling them. Placed back on his coreg, hydralazine, and spiro and was symptomatically stable on discharge. No ACE/ARB with CKD and does not tolerate nitrates with headaches. Discharge weight 193 lbs.   Underwent TEE in 9/18 with severe AI. EF 40-45%. Cath with normal coronaries. S/P AV repair 05/11/2017 with Dr. Roxy Manns.   PYP scan negative 10/18   Echo 02/13/19. EF 45% w/ moderate AI. EF out of range for ICD. Echo 3/21 EF 45% moderate AI. LV dimensions stable.    Seen in clinic 09/14/19 and complained of intermittent palpitations. Echo stable  Zio patch NSR w/ 1 brief run of NSVT (4 beats), 4 brief runs of SVT and rare PVCs < 1% burden.    ED visit 10/02/20 with volume overload. Reds Clip 42%. Instructed to increase lasix to 80 mg daily however the medication script remained 40 mg daily. POC US showed EF 35%.     Echo 10/22 EF 30-35% LV dilated. Mild-mod AI Mod-severe central MR.    Follow up 4/23, stable NYHA II. Plan to repeat echo to evaluate AI and MR.   Admitted 6/23 with chest pain, found to be in a/c CHF. Diuresed with IV lasix. Wilder Glade stopped at patient's request. Echo showed EF down to 20-25%, severe AI. Plan for outpatient TEE to further evaluate valvular disease. Discharge wight 208 lbs.  TEE was performed 07/11, results are below. He was volume overloaded after that and was given Lasix 80 mg IV and enrolled in Furoscix. Will likely need transplant eval.   07/15, he used the Furoscix with improvement.  Phone message on 07/27, +LE edema and needed FMLA papers.   Pt admitted 07/30 with SOB, abd swelling and LE edema, Cards asked to see.   [name] initially did well with the subcutaneous Lasix, the first dose resulted in good urine output.  However, subsequent doses did not result in greatly increased urine output.  He tried it a total of 6 times, but only the first dose gave him good effect.  He has tried to continue to work.  He has tried to maintain a low-sodium diet.  He continued to put on fluid and get more and more short of breath.  Finally, he was so short of breath at rest that he could not take it anymore and came to the ER.  In the ER, he got 80 mg Lasix IV with good urine output.  He is still significantly overloaded, but feels a little better.    Past Medical History:  Diagnosis Date   Anxiety    CHF (congestive heart failure) (HCC)    Chronic systolic heart failure (HCC)    CKD (chronic kidney disease) stage 2, GFR 60-89 ml/min    Dyspnea    with value issues- "if I dont take my medication"   Essential hypertension, benign    Headache    History of pneumonia    Mitral regurgitation    Moderate   Noncompliance    Nonischemic cardiomyopathy ([place])    Normal coronaries May 2012, LVEF < 20%   Pneumonia 2011   S/P aortic valve repair 05/11/2017   Complex  valvuloplasty including plication of left coronary leaflet and 74m Biostable HAART ring annuloplasty    Past Surgical History:  Procedure Laterality Date   AORTIC VALVE REPAIR N/A 05/11/2017   Procedure: AORTIC VALVE REPAIR;  Surgeon: ORexene Alberts MD;  [facility]  Service: Open Heart Surgery;  Laterality: N/A;   CARDIAC SURGERY     RIGHT/LEFT HEART CATH AND CORONARY ANGIOGRAPHY N/A 04/20/2017   Procedure: RIGHT/LEFT HEART CATH AND CORONARY ANGIOGRAPHY;  Surgeon: BJolaine Artist MD;  [facility];  Service: Cardiovascular;  Laterality: N/A;   SURGERY SCROTAL / TESTICULAR     Testicular torsion   TEE WITHOUT CARDIOVERSION N/A 04/09/2017   Procedure: TRANSESOPHAGEAL ECHOCARDIOGRAM (TEE);  Surgeon: BJolaine Artist MD;  [facility];  Service: Cardiovascular;  Laterality: N/A;   TEE WITHOUT CARDIOVERSION N/A 05/11/2017   Procedure: TRANSESOPHAGEAL ECHOCARDIOGRAM (TEE);  Surgeon: ORexene Alberts MD;  [facility]  Service: Open Heart Surgery;  Laterality: N/A;   TEE WITHOUT CARDIOVERSION N/A 01/20/2022   Procedure: TRANSESOPHAGEAL ECHOCARDIOGRAM (TEE);  Surgeon: BJolaine Artist MD;  [facility];  Service: Cardiovascular;  Laterality: N/A;     Home Medications:  Prior to Admission medications   Medication Sig Start Date End Date Taking? Authorizing Provider  albuterol (VENTOLIN HFA) 108 (90 Base) MCG/ACT inhaler Inhale 2 puffs into the lungs every 6 (six) hours as needed for wheezing or shortness of breath. 12/25/21  Yes Emokpae, Courage, MD  allopurinol (ZYLOPRIM) 300 MG tablet Take 1 tablet (300 mg total) by mouth daily. 06/12/21  Yes KSanjuana Kava MD  ALPRAZolam (Duanne Moron 0.5 MG tablet Take 0.5 mg by mouth at bedtime. 09/18/20  Yes [provider]  carvedilol (COREG) 25 MG tablet Take 1 tablet (25 mg total) by mouth 2 (two) times daily with a meal. 12/25/21  Yes Emokpae, Courage, MD  ENTRESTO 97-103 MG Take 1 tablet by  mouth twice daily 01/21/22  Yes Bensimhon, DShaune Pascal MD  FUROSCIX 80 MG/10ML CTKT Inject 80 mg into the skin. 01/27/22  Yes [provider]  furosemide (LASIX) 80 MG tablet Take 80 mg by mouth daily.   Yes [provider]  potassium chloride SA (KLOR-CON M) 20 MEQ tablet Take 20 mEq by mouth daily. 01/13/22  Yes [provider]    Inpatient Medications: Scheduled Meds:  allopurinol  300 mg Oral Daily   carvedilol  25 mg Oral BID WC   [START ON 02/09/2022] enoxaparin (LOVENOX) injection  30 mg Subcutaneous Q24H   [START ON 02/09/2022] furosemide  80 mg Intravenous Daily   polyethylene glycol  17 g Oral Daily   potassium chloride SA  20 mEq Oral Daily   senna-docusate  1 tablet Oral QHS   Continuous Infusions:  PRN Meds:  albuterol  Allergies:   No Known Allergies  Social History:   Social History   Socioeconomic History   Marital status: Married    Spouse name: RaShannon   Number of children: 0   Years of education: Not on file   Highest education level: Some college, no degree  Occupational History    Employer: [employer]  Tobacco Use   Smoking status: Never   Smokeless tobacco: Never  Vaping Use   Vaping Use: Never used  Substance and Sexual Activity   Alcohol use: Yes    Alcohol/week: 0.0 standard drinks of alcohol    Comment: occasional   Drug use: No   Sexual activity: Yes    Birth control/protection: Condom  Other Topics Concern   Not on file  Social History Narrative   Occupation: just started sanitation job cleaning      Patient is right-handed. He lives with his wife in a 1 story house. He drinks 1-2 sodas a day.    Social Determinants of Health   Financial Resource Strain: Not on file  Food Insecurity: Not on file  Transportation Needs: No Transportation Needs (09/28/2019)   PRAPARE - Hydrologist (Medical): No    Lack of Transportation (Non-Medical): No  Physical Activity: Insufficiently  Active (09/28/2019)   Exercise Vital Sign    Days of Exercise per Week: 4 days    Minutes of Exercise per Session: 30 min  Stress: Not on file  Social Connections: Not on file  Intimate Partner Violence: Not on file    Family History:   Family History  Problem Relation Age of Onset   Breast cancer Mother    Stroke Father    Hypertension Father      ROS:  Please see the history of present illness.  All other ROS reviewed and negative.     Physical Exam/Data:   Vitals:   02/08/22 0814 02/08/22 1015  BP: 109/70 110/72  Pulse: 79 77  Resp: 16 (!) 21  Temp: 97.6 F (36.4 C)   TempSrc: Oral   SpO2: 99% 100%   No intake or output data in the 24 hours ending 02/08/22 1405    01/20/2022    7:26 AM 01/05/2022    3:27 PM 12/25/2021    5:00 AM  Last 3 Weights  Weight (lbs) 220 lb 214 lb 9.6 oz 208 lb 1.8 oz  Weight (kg) 99.791 kg 97.342 kg 94.4 kg     There is no height or weight on file to calculate BMI.  General:  Well nourished, well developed, in no acute distress HEENT: normal Neck:  JVD 10 cm Vascular: No carotid bruits; Distal radial pulses 2+ bilaterally Cardiac:  normal S1, S2; RRR; 3-4/6 murmur Lungs:  clear to auscultation bilaterally, no wheezing, rhonchi or rales  Abd: distended, tender, no hepatomegaly  Ext: 2+ edema to his knees Musculoskeletal:  No deformities, BUE and BLE strength normal and equal Skin: warm and dry  Neuro:  CNs 2-12 intact, no focal abnormalities noted Psych:  Normal affect   EKG:  The EKG was personally reviewed and demonstrates:  pending Telemetry:  Telemetry was personally reviewed and demonstrates:  SR  Relevant CV Studies:  TEE: 01/20/2022  FINDINGS:   LEFT VENTRICLE: EF = EF 20% global HK  RIGHT VENTRICLE: Severely HK   LEFT ATRIUM: Severely dilated   LEFT ATRIAL APPENDAGE: No clot   RIGHT ATRIUM: Severely dilated   AORTIC VALVE:  s/p repair. Severe central  AI with Pressure 1/2 time ~ 420s   MITRAL VALVE:   Mild to  moderate MR    TRICUSPID VALVE: Moderate TR   PULMONIC VALVE: Moderate PR   INTERATRIAL SEPTUM: Not visualized   PERICARDIUM: No effusion   DESCENDING AORTA: No significant plaque  ECHO: 12/23/2021  1. Left ventricular ejection fraction, by estimation, is 20 to 25%. The  left ventricle has severely decreased function. The left ventricle  demonstrates global hypokinesis. The left ventricular internal cavity size  was severely dilated. Indeterminate diastolic filling due to E-A fusion.   2. Right ventricular systolic function is severely reduced. The right  ventricular size is moderately enlarged. There is normal pulmonary artery  systolic pressure. The estimated right ventricular systolic pressure is  65.7 mmHg.   3. Left atrial size was severely dilated.   4. Right atrial size was severely dilated.   5. The mitral valve is grossly normal. Moderate to severe mitral valve  regurgitation. No evidence of mitral stenosis.   6. S/p Aortic Valve Repair with 19 mm annuloplasty ring (05/11/2017).  There is residual aortic regurgitation that is severe and eccentric. V max  2.9 m/s, MG 16 mmHG. Suspect this is related to AI. Recommend TEE for further characterization. The aortic valve has been repaired/replaced. Aortic valve regurgitation is severe. There is a 19 mm valve present in the aortic position. Procedure Date: 05/11/2017.   7. Aortic dilatation noted. Aneurysm of the ascending aorta, measuring 47 mm. There is severe dilatation of the aortic root, measuring 44 mm.   8. The inferior vena cava is normal in size with <50% respiratory  variability, suggesting right atrial pressure of 8 mmHg.   Comparison(s): Changes from prior study are noted. EF now 20-25%. Severe AI.   L/R HEART CATH: 04/20/2017 Ao = 125/75 (97) LV = 96/10 RA = 4 RV = 32/7 PA = 31/17 (23) PCW = 16 Fick cardiac output/index = 4.8/2.2 PVR = 1.5 WU Ao sat = 99% PA sat = 69%, 71%   Assessment: 1. Normal  coronaries with left dominant system 2. Severe AI and dilated aortic root by echo 3. Well compensated filling pressures   Plan/Discussion:   For possible aortic valve repair with Dr. Roxy Manns.  Laboratory Data:  High Sensitivity Troponin:   Recent Labs  Lab 02/08/22 0822  TROPONINIHS 48*     Chemistry Recent Labs  Lab 02/08/22 0822  NA 136  K 4.1  CL 108  CO2 21*  GLUCOSE 125*  BUN 31*  CREATININE 2.63*  CALCIUM 8.7*  GFRNONAA 31*  ANIONGAP 7    Recent Labs  Lab 02/08/22 0822  PROT 5.3*  ALBUMIN 2.9*  AST 25  ALT 22  ALKPHOS 65  BILITOT 1.4*   Lipids No results for input(s): "CHOL", "TRIG", "HDL", "LABVLDL", "LDLCALC", "CHOLHDL" in the last 168 hours.  Hematology Recent Labs  Lab 02/08/22 0822  WBC 5.8  RBC 4.85  HGB 13.3  HCT 41.4  MCV 85.4  MCH 27.4  MCHC 32.1  RDW 17.9*  PLT 226   Thyroid No results for input(s): "TSH", "FREET4" in the last 168 hours.  BNP Recent Labs  Lab 02/08/22 0822  BNP 3,036.5*    DDimer No results for input(s): "DDIMER" in the last 168 hours.   Radiology/Studies:  CT ABDOMEN PELVIS WO CONTRAST  Result Date: 02/08/2022 CLINICAL DATA:  RIGHT lower quadrant abdominal pain, question of hernia. EXAM: CT ABDOMEN AND PELVIS WITHOUT CONTRAST TECHNIQUE: Multidetector CT imaging of the abdomen and  pelvis was performed following the standard protocol without IV contrast. RADIATION DOSE REDUCTION: This exam was performed according to the departmental dose-optimization program which includes automated exposure control, adjustment of the mA and/or kV according to patient size and/or use of iterative reconstruction technique. COMPARISON:  No recent prior imaging is available for comparison aside from CT of the chest from March of 2022. FINDINGS: Lower chest: Small RIGHT-sided effusion. Mild septal thickening. No consolidation. Marked cardiomegaly. Signs of prior aortic valve replacement and median sternotomy, incompletely evaluated.  Hepatobiliary: Lobular and nodular hepatic contours. No visible lesion on noncontrast imaging. No gallbladder distension or focal pericholecystic stranding. No gross biliary duct distension. Pancreas: There is some stranding about the pancreas. The contour the pancreas is normal. There is no focal fluid about the pancreas. There is generalized edema in the abdomen and there is ascites. Spleen: Normal size and contour. Adrenals/Urinary Tract: Adrenal glands are normal. Smooth renal contours. Perinephric stranding bilaterally. No sign of hydronephrosis. Urinary bladder is collapsed. No visible calculi in the kidneys or ureters. Stomach/Bowel: No signs of bowel obstruction or acute bowel process. No bowel containing hernia. Appendix is normal. Colon without signs of inflammation or obstruction with colonic diverticulosis throughout the colon. Vascular/Lymphatic: Aortic atherosclerosis. No signs of aneurysmal dilation of the abdominal aorta. There is no gastrohepatic or hepatoduodenal ligament lymphadenopathy. No retroperitoneal or mesenteric lymphadenopathy. No pelvic sidewall lymphadenopathy. Reproductive: Unremarkable by CT. Other: Moderate RIGHT inguinal hernia contains fat and a small amount of ascites. There is small volume ascites in the abdomen and the pelvis this is mainly along the RIGHT hepatic margin and about the pelvis and to a lesser extent along the LEFT paracolic gutter in and about the spleen. Diffuse body wall edema. Musculoskeletal: No acute or significant osseous findings. IMPRESSION: 1. Signs of anasarca with small RIGHT effusion, pulmonary edema, ascites and body wall edema. 2. Marked cardiomegaly. 3. Lobular and nodular hepatic contours, suggestive of cirrhosis perhaps cardiogenic but without overt signs of portal hypertension. Correlate with liver enzymes. 4. Moderate RIGHT inguinal hernia contains fat and a small amount of ascites. 5. Perhaps more pronounced stranding about the central  abdominal small bowel mesentery in the pancreas. Correlate with pancreatic enzymes and with any abdominal symptoms that would suggest pancreatitis or duodenitis though findings are favored to represent sequela of anasarca. 6. Colonic diverticulosis without evidence of inflammation or obstruction. 7. Aortic atherosclerosis. Aortic Atherosclerosis (ICD10-I70.0). Electronically Signed   By: Zetta Bills M.D.   On: 02/08/2022 10:06   DG Chest 2 View  Result Date: 02/08/2022 CLINICAL DATA:  Stomach pain and history of inguinal hernia. EXAM: CHEST - 2 VIEW COMPARISON:  12/22/2021 FINDINGS: Mild perihilar and bibasilar interstitial edema or infiltrates, new since previous. Stable cardiomegaly. No effusion.  New fluid in or thickening of the interlobar fissures. Sternotomy wires. IMPRESSION: New mild bilateral interstitial edema or infiltrates Stable cardiomegaly Electronically Signed   By: Lucrezia Europe M.D.   On: 02/08/2022 08:47     Assessment and Plan:   Acute on chronic systolic CHF -Admit, continue IV Lasix 80 mg, but increased to twice daily -Follow BMET, daily weights, intake/output. -CHF team to pick up in the AM.  Otherwise, per IM   Risk Assessment/Risk Scores:       New York Heart Association (NYHA) Functional Class NYHA Class IV    For questions or updates, please contact [place] Please consult [email or web address] for contact info under    Signed, Rosaria Ferries, PA-C  02/08/2022 2:05 PM  I have seen and examined the patient along with Rosaria Ferries, PA-C .  I have reviewed the chart, notes and new data.  I agree with PA/NP's note.  Key new complaints: orthopnea and generalized edema. Has not slept horizontally in bed since twins were born a year ago. No dizziness or syncope. Reports compliance with meds, but not with sodium restricted diet. SubCu Furoscix helped for the first few doses, but not causing increased diuresis in last couple of weeks. Has not weighed himself at  home. Good initial diuretic response to IV furosemide in ED, but still dyspneic. Key examination changes: Anasarca. JVP 10 cm. Leg edema to above knees. Abdominal distention with mild tenderness. Reduced breath sounds in bases, but no rales. RRR, 4/6 Ao ejection murmur, 3/6 decrescendo diastolic murmurs are heard both at LUSB and up and down the entire LUSB Key new findings / data: creat worse than baseline. NSR on telemetry. BNP of limited value w Entresto, but is higher than previous baseline (2400-2600 last winter).  PLAN: Severe decompensation of biventricular heart failure in setting of longstanding cardiomyopathy and severe aortic insufficiency. Continue IV diuretics. On Entresto and carvedilol max dose. Intolerant of hydralazine-nitrates and he believes [place] made him worse. Not on spironolactone due to hyperkalemia issues. Consult advanced HF service in AM.  Sanda Klein, MD, [place] [phone] 02/08/2022, 3:35 PM

## 2022-02-08 NOTE — H&P (Addendum)
Hospital Admission History and Physical Service Pager: [phone]  Patient name: [name] [name] Medical record number: [id number] Date of Birth: [birth year] Age: 41 y.o. Gender: male  Primary Care Provider: Sharilyn Sites, MD Consultants: Cardiology Code Status: FULL Preferred Emergency Contact: spouse [place]     Name Relation Home Work Mobile   [place] Spouse [phone]  [phone]   [place], [place] Mother   [phone]       Chief Complaint: Abdominal pain, SOB, leg swelling  Assessment and Plan: [name] [name] is a 41 y.o. male presenting with abdominal pain, SOB, and leg swelling. Differential for this patient's presentation includes CHF exacerbation, pneumonia, incarcerated hernia, and PE. CHF exacerbation is most likely given his history and evidence of volume overload on exam, labs, and imaging. Pneumonia and incarcerated hernia are less likely given lack of characteristic imaging findings. PE is also less likely given lack of hypoxia, chest pain, and LE DVT findings.  * CHF exacerbation (HCC) Stable on RA. Volume overloaded on exam and on imaging. BNP markedly elevated. Given need for aggressive diuresis, will admit to [facility] cardiac telemetry, attending Dr. Gwendlyn Deutscher. -IV lasix 80 mg daily -Supplemental O2 as needed, continuous pulse ox -Strict I/Os, daily weights -Continue home coreg 25 mg BID -Hold home entresto for now given AKI on CKD3 -Consider addition of SGLT2 at d/c, though failed trial previously -AM CBC, CMP -Consult cardiology given close follow up with Dr. Sung Amabile, HF clinic  AKI on CKD3 Cr 2.63, baseline around 1.60. Suspect 2/2 fluid overload. -Continue diuresis as above, expect Cr to improve  Constipation Uncontrolled. Could be contributing to abdominal pain, though fluid overload likely main cause. Had one small BM this morning. -Schedule miralax, senna daily and assess response  Chronic and stable  problems: -Anxiety: holding Xanax for now, has not used since birth of twins -HTN: BP stable, continuing lasix for diuresis -Inguinal hernia: present on R side. Nonpainful. Was supposed to follow up for further management with surgery though did not with the recent birth of his twins. Consider surgery referral o/p at PCP f/u  FEN/GI: Heart healthy VTE Prophylaxis: Lovenox  Disposition: Cardiac-tele  History of Present Illness:  [name] [name] is a 41 y.o. male presenting with abdominal pain, SOB, and leg swelling.  Patient reports worsening abdominal swelling causing pain, nausea, and occasional vomiting over the past month. He was primarily concerned about a hernia which he has had over a year. Also feeling increased SOB and leg swelling during this time. He states he takes a furosemide pump (Furoscix, subcutaneous furosemide) which injects the medication. He reports good adherence to his medications. He reports he has not noticed good urine output recently. Reports worsening constipation over the past few months. Had a small BM this morning.  He follows with Dr. Sung Amabile and HF clinic. He had a recent TEE to evaluate his mitral valve. There has been discussion about heart transplant.  In the ED, he received 80 mg lasix once. Labs and imaging returned with evidence of volume overload.  Review Of Systems: Review of Systems  Constitutional:  Negative for chills and fever.  Respiratory:  Positive for shortness of breath.   Cardiovascular:  Positive for leg swelling.  Gastrointestinal:  Positive for constipation (worse over the past few months). Negative for diarrhea.   Pertinent Past Medical History: CHF d/t NICM CKD 3 HTN Aortic insufficiency s/p valvuloplasty Mitral regurg OSA Remainder reviewed in history tab.   Pertinent Past Surgical History: Aortic valvuloplasty R/L  heart cath  Remainder reviewed in history tab.   Pertinent Social History: Tobacco use:  Yes/No/Former Alcohol use: occasional, social Other Substance use: denies Lives with wife, twin newborns  Pertinent Family History: Stroke, HTN in father Remainder reviewed in history tab.   Important Outpatient Medications: Coreg 25 mg BID Entresto 97-103 mg daily Lasix 80 mg daily K 20 mEq daily Alprazolam 0.5 mg qhs prn (has not been taking it consistently recently) Remainder reviewed in medication history.   Objective: BP 110/72   Pulse 77   Temp 97.6 F (36.4 C) (Oral)   Resp (!) 21   SpO2 100%  CrCl 53 mL/min Exam: General: Alert and oriented, in NAD Skin: Warm, dry, and intact without lesions HEENT: NCAT, EOM grossly normal, midline nasal septum Cardiac: RRR, 4/6 holosystolic murmur loudest at USB Respiratory: CTAB, breathing and speaking comfortably on RA Abdominal: Soft, TTP L/R flank, moderately distended, normoactive bowel sounds Extremities: 3+ pitting edema to knees, moves all extremities grossly equally Neurological: No gross focal deficit Psychiatric: Appropriate mood and affect   Labs:  CBC BMET  Recent Labs  Lab 02/08/22 0822  WBC 5.8  HGB 13.3  HCT 41.4  PLT 226   Recent Labs  Lab 02/08/22 0822  NA 136  K 4.1  CL 108  CO2 21*  BUN 31*  CREATININE 2.63*  GLUCOSE 125*  CALCIUM 8.7*    Troponin 48 BNP 3036.5 Lipase 32 Protein 5.2 Albumin 2.9 Total bili 1.4  EKG: pending from ED  Imaging Studies Performed: CXR IMPRESSION: New mild bilateral interstitial edema or infiltrates Stable cardiomegaly  CTAP IMPRESSION: 1. Signs of anasarca with small RIGHT effusion, pulmonary edema, ascites and body wall edema. 2. Marked cardiomegaly. 3. Lobular and nodular hepatic contours, suggestive of cirrhosis perhaps cardiogenic but without overt signs of portal hypertension. Correlate with liver enzymes. 4. Moderate RIGHT inguinal hernia contains fat and a small amount of ascites. 5. Perhaps more pronounced stranding about the central  abdominal small bowel mesentery in the pancreas. Correlate with pancreatic enzymes and with any abdominal symptoms that would suggest pancreatitis or duodenitis though findings are favored to represent sequela of anasarca. 6. Colonic diverticulosis without evidence of inflammation or obstruction. 7. Aortic atherosclerosis.  Ethelene Hal, MD 02/08/2022, 1:01 PM PGY-1, [place] Intern pager: [phone], text pages welcome Secure chat group [facility] Teaching Service   HFrEF exacerbation in the setting of significant valvular disease and waning medication efficacy (patient reported noticing less urine output despite good adherence to medications).  May need higher doses of IV furosemide as I am unsure of the conversion ratio of potency between IV and [place].   I was personally present and performed or re-performed the history, physical exam and medical decision making activities of this service and have verified that the service and findings are accurately documented in the resident's note.  Zola Button, MD                  02/08/2022, 1:53 PM

## 2022-02-08 NOTE — Assessment & Plan Note (Addendum)
Uncontrolled. Could be contributing to abdominal pain, though fluid overload likely main cause. Had one small BM this morning. -Schedule miralax, senna daily and assess response

## 2022-02-08 NOTE — Assessment & Plan Note (Deleted)
Present on R side. Nonpainful. Was supposed to follow up for further management with surgery though did not with the recent birth of his twins. -Consider surgery referral o/p at PCP f/u

## 2022-02-08 NOTE — Assessment & Plan Note (Addendum)
Cr improving to 1.69 (2.63 on admission), baseline around 1.60. Suspect 2/2 cardiorenal. -Continue treating CHF as above, expect Cr to improve

## 2022-02-08 NOTE — ED Provider Notes (Signed)
[facility] EMERGENCY DEPARTMENT Provider Note   CSN: [id number] Arrival date & time: 02/08/22  3875     History  Chief Complaint  Patient presents with   Abdominal Pain    [name] [name] is a 41 y.o. male.  Patient here with lower abdominal pain and shortness of breath and leg swelling.  As far as abdominal pain [name] [name] has suspected right inguinal hernia that causes some discomfort at time.  Has been slightly more painful in this area.  [name] [name] has been passing gas.  No nausea or vomiting.  Does have some constipation.  As far as his shortness of breath is gotten worse over the last several weeks.  Increased leg swelling.  [name] [name] is on furosemide.  Has a history of cardiomyopathy and valve replacement.  Denies any chest pain but continues to have worsening shortness of breath with exertion.  [name] [name] does not weigh himself daily.  Denies any fevers or chills.  The history is provided by the patient.       Home Medications Prior to Admission medications   Medication Sig Start Date End Date Taking? Authorizing Provider  acetaminophen (TYLENOL) 500 MG tablet Take 1,000 mg by mouth every 6 (six) hours as needed for moderate pain or headache.     [provider]  albuterol (VENTOLIN HFA) 108 (90 Base) MCG/ACT inhaler Inhale 2 puffs into the lungs every 6 (six) hours as needed for wheezing or shortness of breath. 12/25/21   Roxan Hockey, MD  allopurinol (ZYLOPRIM) 300 MG tablet Take 1 tablet (300 mg total) by mouth daily. 06/12/21   Sanjuana Kava, MD  ALPRAZolam Duanne Moron) 0.5 MG tablet Take 0.5 mg by mouth at bedtime. 09/18/20   [provider]  carvedilol (COREG) 25 MG tablet Take 1 tablet (25 mg total) by mouth 2 (two) times daily with a meal. 12/25/21   Roxan Hockey, MD  ENTRESTO 97-103 MG Take 1 tablet by mouth twice daily 01/21/22   Bensimhon, Shaune Pascal, MD  furosemide (LASIX) 80 MG tablet Take 80 mg by mouth daily.    [provider]  Potassium Chloride  ER 20 MEQ TBCR Take 20 mEq by mouth daily. Take daily while on Lasix/furosemide 12/25/21   Roxan Hockey, MD      Allergies    Patient has no known allergies.    Review of Systems   Review of Systems  Physical Exam Updated Vital Signs BP 110/72   Pulse 77   Temp 97.6 F (36.4 C) (Oral)   Resp (!) 21   SpO2 100%  Physical Exam Vitals and nursing note reviewed.  Constitutional:      General: [name] [name] is not in acute distress.    Appearance: [name] [name] is well-developed.  HENT:     Head: Normocephalic and atraumatic.     Mouth/Throat:     Mouth: Mucous membranes are moist.  Eyes:     Extraocular Movements: Extraocular movements intact.     Conjunctiva/sclera: Conjunctivae normal.  Cardiovascular:     Rate and Rhythm: Normal rate and regular rhythm.     Heart sounds: Normal heart sounds. No murmur heard.    Comments: About 3+ pitting edema bilaterally Pulmonary:     Effort: Pulmonary effort is normal. No respiratory distress.     Breath sounds: Rhonchi and rales present.  Abdominal:     Palpations: Abdomen is soft.     Tenderness: There is no abdominal tenderness.     Hernia: A hernia is present. Hernia is  present in the right inguinal area.     Comments: Possibly a right inguinal hernia but no overlying skin changes, seems mildly reducible  Genitourinary:    Penis: Normal.      Testes:        Right: Mass or tenderness not present.        Left: Mass or tenderness not present.  Musculoskeletal:        General: No swelling.     Cervical back: Neck supple.  Skin:    General: Skin is warm and dry.     Capillary Refill: Capillary refill takes less than 2 seconds.  Neurological:     Mental Status: [name] [name] is alert.  Psychiatric:        Mood and Affect: Mood normal.     ED Results / Procedures / Treatments   Labs (all labs ordered are listed, but only abnormal results are displayed) Labs Reviewed  BASIC METABOLIC PANEL - Abnormal; Notable for the following components:      Result  Value   CO2 21 (*)    Glucose, Bld 125 (*)    BUN 31 (*)    Creatinine, Ser 2.63 (*)    Calcium 8.7 (*)    GFR, Estimated 31 (*)    All other components within normal limits  CBC - Abnormal; Notable for the following components:   RDW 17.9 (*)    All other components within normal limits  BRAIN NATRIURETIC PEPTIDE - Abnormal; Notable for the following components:   B Natriuretic Peptide 3,036.5 (*)    All other components within normal limits  HEPATIC FUNCTION PANEL - Abnormal; Notable for the following components:   Total Protein 5.3 (*)    Albumin 2.9 (*)    Total Bilirubin 1.4 (*)    Bilirubin, Direct 0.4 (*)    Indirect Bilirubin 1.0 (*)    All other components within normal limits  TROPONIN I (HIGH SENSITIVITY) - Abnormal; Notable for the following components:   Troponin I (High Sensitivity) 48 (*)    All other components within normal limits  LIPASE, BLOOD  TROPONIN I (HIGH SENSITIVITY)    EKG None  Radiology CT ABDOMEN PELVIS WO CONTRAST  Result Date: 02/08/2022 CLINICAL DATA:  RIGHT lower quadrant abdominal pain, question of hernia. EXAM: CT ABDOMEN AND PELVIS WITHOUT CONTRAST TECHNIQUE: Multidetector CT imaging of the abdomen and pelvis was performed following the standard protocol without IV contrast. RADIATION DOSE REDUCTION: This exam was performed according to the departmental dose-optimization program which includes automated exposure control, adjustment of the mA and/or kV according to patient size and/or use of iterative reconstruction technique. COMPARISON:  No recent prior imaging is available for comparison aside from CT of the chest from March of 2022. FINDINGS: Lower chest: Small RIGHT-sided effusion. Mild septal thickening. No consolidation. Marked cardiomegaly. Signs of prior aortic valve replacement and median sternotomy, incompletely evaluated. Hepatobiliary: Lobular and nodular hepatic contours. No visible lesion on noncontrast imaging. No gallbladder  distension or focal pericholecystic stranding. No gross biliary duct distension. Pancreas: There is some stranding about the pancreas. The contour the pancreas is normal. There is no focal fluid about the pancreas. There is generalized edema in the abdomen and there is ascites. Spleen: Normal size and contour. Adrenals/Urinary Tract: Adrenal glands are normal. Smooth renal contours. Perinephric stranding bilaterally. No sign of hydronephrosis. Urinary bladder is collapsed. No visible calculi in the kidneys or ureters. Stomach/Bowel: No signs of bowel obstruction or acute bowel process. No bowel containing hernia. Appendix  is normal. Colon without signs of inflammation or obstruction with colonic diverticulosis throughout the colon. Vascular/Lymphatic: Aortic atherosclerosis. No signs of aneurysmal dilation of the abdominal aorta. There is no gastrohepatic or hepatoduodenal ligament lymphadenopathy. No retroperitoneal or mesenteric lymphadenopathy. No pelvic sidewall lymphadenopathy. Reproductive: Unremarkable by CT. Other: Moderate RIGHT inguinal hernia contains fat and a small amount of ascites. There is small volume ascites in the abdomen and the pelvis this is mainly along the RIGHT hepatic margin and about the pelvis and to a lesser extent along the LEFT paracolic gutter in and about the spleen. Diffuse body wall edema. Musculoskeletal: No acute or significant osseous findings. IMPRESSION: 1. Signs of anasarca with small RIGHT effusion, pulmonary edema, ascites and body wall edema. 2. Marked cardiomegaly. 3. Lobular and nodular hepatic contours, suggestive of cirrhosis perhaps cardiogenic but without overt signs of portal hypertension. Correlate with liver enzymes. 4. Moderate RIGHT inguinal hernia contains fat and a small amount of ascites. 5. Perhaps more pronounced stranding about the central abdominal small bowel mesentery in the pancreas. Correlate with pancreatic enzymes and with any abdominal symptoms  that would suggest pancreatitis or duodenitis though findings are favored to represent sequela of anasarca. 6. Colonic diverticulosis without evidence of inflammation or obstruction. 7. Aortic atherosclerosis. Aortic Atherosclerosis (ICD10-I70.0). Electronically Signed   By: Zetta Bills M.D.   On: 02/08/2022 10:06   DG Chest 2 View  Result Date: 02/08/2022 CLINICAL DATA:  Stomach pain and history of inguinal hernia. EXAM: CHEST - 2 VIEW COMPARISON:  12/22/2021 FINDINGS: Mild perihilar and bibasilar interstitial edema or infiltrates, new since previous. Stable cardiomegaly. No effusion.  New fluid in or thickening of the interlobar fissures. Sternotomy wires. IMPRESSION: New mild bilateral interstitial edema or infiltrates Stable cardiomegaly Electronically Signed   By: Lucrezia Europe M.D.   On: 02/08/2022 08:47    Procedures Procedures    Medications Ordered in ED Medications  furosemide (LASIX) injection 80 mg (has no administration in time range)    ED Course/ Medical Decision Making/ A&P                           Medical Decision Making Amount and/or Complexity of Data Reviewed Labs: ordered. Radiology: ordered.  Risk Prescription drug management. Decision regarding hospitalization.   MASTON WIGHT is here with shortness of breath, abdominal pain, leg swelling.  History of heart failure.  History of inguinal hernia.  Overall appears to have diffuse anasarca on exam.  [name] [name] has some tenderness at what appears to be likely a fat-containing right inguinal hernia.  We will get CBC, CMP, lipase, troponin, BNP, chest x-ray, CT scan abdomen pelvis.  Differential diagnosis likely heart failure exacerbation versus less likely ACS.  Will rule out bowel obstruction, incarcerated hernia.  Patient with elevated BNP in the 3000, troponin elevated in the 40s.  Chest x-ray concerning for volume overload.  CT scan overall consistent with volume overload.  Has fat containing right inguinal hernia.   Overall given that [name] [name] appears to be grossly volume overloaded will admit for further care.  Creatinine is mildly elevated from his baseline.  Will give IV Lasix.  Will admit for closer monitoring of his electrolytes while [name] [name] gets diuresed.  This chart was dictated using voice recognition software.  Despite best efforts to proofread,  errors can occur which can change the documentation meaning.         Final Clinical Impression(s) / ED Diagnoses Final diagnoses:  Acute right-sided  congestive heart failure [facility])    Rx / DC Orders ED Discharge Orders     None         Lennice Sites, DO 02/08/22 1118

## 2022-02-09 ENCOUNTER — Inpatient Hospital Stay: Payer: Self-pay

## 2022-02-09 DIAGNOSIS — I509 Heart failure, unspecified: Principal | ICD-10-CM

## 2022-02-09 DIAGNOSIS — I5043 Acute on chronic combined systolic (congestive) and diastolic (congestive) heart failure: Secondary | ICD-10-CM

## 2022-02-09 DIAGNOSIS — Z91148 Patient's other noncompliance with medication regimen for other reason: Secondary | ICD-10-CM | POA: Diagnosis not present

## 2022-02-09 DIAGNOSIS — F419 Anxiety disorder, unspecified: Secondary | ICD-10-CM | POA: Diagnosis present

## 2022-02-09 DIAGNOSIS — N1831 Chronic kidney disease, stage 3a: Secondary | ICD-10-CM | POA: Diagnosis present

## 2022-02-09 DIAGNOSIS — E875 Hyperkalemia: Secondary | ICD-10-CM | POA: Diagnosis present

## 2022-02-09 DIAGNOSIS — I5082 Biventricular heart failure: Secondary | ICD-10-CM | POA: Diagnosis present

## 2022-02-09 DIAGNOSIS — I340 Nonrheumatic mitral (valve) insufficiency: Secondary | ICD-10-CM

## 2022-02-09 DIAGNOSIS — I50811 Acute right heart failure: Secondary | ICD-10-CM | POA: Diagnosis present

## 2022-02-09 DIAGNOSIS — K5900 Constipation, unspecified: Secondary | ICD-10-CM | POA: Diagnosis present

## 2022-02-09 DIAGNOSIS — I428 Other cardiomyopathies: Secondary | ICD-10-CM | POA: Diagnosis present

## 2022-02-09 DIAGNOSIS — I5022 Chronic systolic (congestive) heart failure: Secondary | ICD-10-CM | POA: Diagnosis not present

## 2022-02-09 DIAGNOSIS — Z823 Family history of stroke: Secondary | ICD-10-CM | POA: Diagnosis not present

## 2022-02-09 DIAGNOSIS — K4090 Unilateral inguinal hernia, without obstruction or gangrene, not specified as recurrent: Secondary | ICD-10-CM | POA: Diagnosis present

## 2022-02-09 DIAGNOSIS — I77810 Thoracic aortic ectasia: Secondary | ICD-10-CM | POA: Diagnosis present

## 2022-02-09 DIAGNOSIS — I10 Essential (primary) hypertension: Secondary | ICD-10-CM

## 2022-02-09 DIAGNOSIS — I700 Atherosclerosis of aorta: Secondary | ICD-10-CM | POA: Diagnosis present

## 2022-02-09 DIAGNOSIS — E118 Type 2 diabetes mellitus with unspecified complications: Secondary | ICD-10-CM | POA: Diagnosis not present

## 2022-02-09 DIAGNOSIS — Z8249 Family history of ischemic heart disease and other diseases of the circulatory system: Secondary | ICD-10-CM | POA: Diagnosis not present

## 2022-02-09 DIAGNOSIS — R570 Cardiogenic shock: Secondary | ICD-10-CM | POA: Diagnosis not present

## 2022-02-09 DIAGNOSIS — Z91199 Patient's noncompliance with other medical treatment and regimen due to unspecified reason: Secondary | ICD-10-CM | POA: Diagnosis not present

## 2022-02-09 DIAGNOSIS — N183 Chronic kidney disease, stage 3 unspecified: Secondary | ICD-10-CM

## 2022-02-09 DIAGNOSIS — I130 Hypertensive heart and chronic kidney disease with heart failure and stage 1 through stage 4 chronic kidney disease, or unspecified chronic kidney disease: Secondary | ICD-10-CM | POA: Diagnosis present

## 2022-02-09 DIAGNOSIS — Z952 Presence of prosthetic heart valve: Secondary | ICD-10-CM | POA: Diagnosis not present

## 2022-02-09 DIAGNOSIS — I5023 Acute on chronic systolic (congestive) heart failure: Secondary | ICD-10-CM | POA: Diagnosis present

## 2022-02-09 DIAGNOSIS — I080 Rheumatic disorders of both mitral and aortic valves: Secondary | ICD-10-CM | POA: Diagnosis present

## 2022-02-09 DIAGNOSIS — K761 Chronic passive congestion of liver: Secondary | ICD-10-CM | POA: Diagnosis present

## 2022-02-09 DIAGNOSIS — G4733 Obstructive sleep apnea (adult) (pediatric): Secondary | ICD-10-CM | POA: Diagnosis present

## 2022-02-09 DIAGNOSIS — Z79899 Other long term (current) drug therapy: Secondary | ICD-10-CM | POA: Diagnosis not present

## 2022-02-09 DIAGNOSIS — E1122 Type 2 diabetes mellitus with diabetic chronic kidney disease: Secondary | ICD-10-CM | POA: Diagnosis present

## 2022-02-09 DIAGNOSIS — N179 Acute kidney failure, unspecified: Secondary | ICD-10-CM | POA: Diagnosis present

## 2022-02-09 DIAGNOSIS — I493 Ventricular premature depolarization: Secondary | ICD-10-CM | POA: Diagnosis present

## 2022-02-09 LAB — COOXEMETRY PANEL
Carboxyhemoglobin: <0.3 % — ABNORMAL LOW (ref 0.5–1.5)
Carboxyhemoglobin: 1.3 % (ref 0.5–1.5)
Methemoglobin: 1 % (ref 0.0–1.5)
Methemoglobin: <0.7 % (ref 0.0–1.5)
O2 Saturation: 26.4 %
O2 Saturation: 59.5 %
Total hemoglobin: 13.6 g/dL (ref 12.0–16.0)
Total hemoglobin: 13.3 g/dL (ref 12.0–16.0)

## 2022-02-09 LAB — COMPREHENSIVE METABOLIC PANEL
ALT: 20 U/L (ref 0–44)
AST: 19 U/L (ref 15–41)
Albumin: 2.7 g/dL — ABNORMAL LOW (ref 3.5–5.0)
Alkaline Phosphatase: 57 U/L (ref 38–126)
Anion gap: 7 (ref 5–15)
BUN: 32 mg/dL — ABNORMAL HIGH (ref 6–20)
CO2: 21 mmol/L — ABNORMAL LOW (ref 22–32)
Calcium: 8.6 mg/dL — ABNORMAL LOW (ref 8.9–10.3)
Chloride: 108 mmol/L (ref 98–111)
Creatinine, Ser: 2.33 mg/dL — ABNORMAL HIGH (ref 0.61–1.24)
GFR, Estimated: 35 mL/min — ABNORMAL LOW (ref >60)
Glucose, Bld: 95 mg/dL (ref 70–99)
Potassium: 4.2 mmol/L (ref 3.5–5.1)
Sodium: 136 mmol/L (ref 135–145)
Total Bilirubin: 1.3 mg/dL — ABNORMAL HIGH (ref 0.3–1.2)
Total Protein: 5.2 g/dL — ABNORMAL LOW (ref 6.5–8.1)

## 2022-02-09 LAB — LIPID PANEL
Cholesterol: 118 mg/dL (ref 0–200)
HDL: 24 mg/dL — ABNORMAL LOW (ref >40)
LDL Cholesterol: 75 mg/dL (ref 0–99)
Total CHOL/HDL Ratio: 4.9 RATIO
Triglycerides: 94 mg/dL (ref <150)
VLDL: 19 mg/dL (ref 0–40)

## 2022-02-09 LAB — TSH: TSH: 2.861 u[IU]/mL (ref 0.350–4.500)

## 2022-02-09 LAB — CBC
HCT: 38.7 % — ABNORMAL LOW (ref 39.0–52.0)
Hemoglobin: 12.9 g/dL — ABNORMAL LOW (ref 13.0–17.0)
MCH: 27.7 pg (ref 26.0–34.0)
MCHC: 33.3 g/dL (ref 30.0–36.0)
MCV: 83 fL (ref 80.0–100.0)
Platelets: 222 10*3/uL (ref 150–400)
RBC: 4.66 MIL/uL (ref 4.22–5.81)
RDW: 17.3 % — ABNORMAL HIGH (ref 11.5–15.5)
WBC: 5.1 10*3/uL (ref 4.0–10.5)
nRBC: 0 % (ref 0.0–0.2)

## 2022-02-09 LAB — HEMOGLOBIN A1C
Hgb A1c MFr Bld: 6.6 % — ABNORMAL HIGH (ref 4.8–5.6)
Mean Plasma Glucose: 142.72 mg/dL

## 2022-02-09 LAB — LACTIC ACID, PLASMA: Lactic Acid, Venous: 1.7 mmol/L (ref 0.5–1.9)

## 2022-02-09 LAB — MAGNESIUM: Magnesium: 1.8 mg/dL (ref 1.7–2.4)

## 2022-02-09 MED ORDER — SODIUM CHLORIDE 0.9% FLUSH
10.0000 mL | Freq: Two times a day (BID) | INTRAVENOUS | Status: DC
  Administered 2022-02-10 – 2022-02-12 (×2): 10 mL

## 2022-02-09 MED ORDER — CARVEDILOL 12.5 MG PO TABS
12.5000 mg | ORAL_TABLET | Freq: Two times a day (BID) | ORAL | Status: DC
  Filled 2022-02-09: qty 1

## 2022-02-09 MED ORDER — CHLORHEXIDINE GLUCONATE CLOTH 2 % EX PADS
6.0000 | MEDICATED_PAD | Freq: Every day | CUTANEOUS | Status: DC
  Administered 2022-02-09 – 2022-02-12 (×4): 6 via TOPICAL

## 2022-02-09 MED ORDER — SODIUM CHLORIDE 0.9% FLUSH: 10.0000 mL | INTRAVENOUS | Status: DC | PRN

## 2022-02-09 MED ORDER — ENOXAPARIN SODIUM 40 MG/0.4ML IJ SOSY
40.0000 mg | PREFILLED_SYRINGE | INTRAMUSCULAR | Status: DC
  Administered 2022-02-10 – 2022-02-12 (×3): 40 mg via SUBCUTANEOUS
  Filled 2022-02-09 (×3): qty 0.4

## 2022-02-09 MED ORDER — ACETAMINOPHEN 325 MG PO TABS
650.0000 mg | ORAL_TABLET | Freq: Once | ORAL | Status: AC
  Administered 2022-02-09: 650 mg via ORAL
  Filled 2022-02-09: qty 2

## 2022-02-09 MED ORDER — ALPRAZOLAM 0.5 MG PO TABS
0.5000 mg | ORAL_TABLET | Freq: Every day | ORAL | Status: DC
  Administered 2022-02-09 – 2022-02-11 (×3): 0.5 mg via ORAL
  Filled 2022-02-09 (×3): qty 1

## 2022-02-09 MED ORDER — METOLAZONE 2.5 MG PO TABS
2.5000 mg | ORAL_TABLET | Freq: Once | ORAL | Status: AC
  Administered 2022-02-09: 2.5 mg via ORAL
  Filled 2022-02-09: qty 1

## 2022-02-09 MED ORDER — MILRINONE LACTATE IN DEXTROSE 20-5 MG/100ML-% IV SOLN
0.3750 ug/kg/min | INTRAVENOUS | Status: DC
  Administered 2022-02-09: 0.25 ug/kg/min via INTRAVENOUS
  Administered 2022-02-10 – 2022-02-12 (×7): 0.375 ug/kg/min via INTRAVENOUS
  Filled 2022-02-09 (×8): qty 100

## 2022-02-09 NOTE — Plan of Care (Signed)
  Problem: Clinical Measurements: Goal: Will remain free from infection Outcome: Completed/Met   Problem: Nutrition: Goal: Adequate nutrition will be maintained Outcome: Completed/Met   Problem: Coping: Goal: Level of anxiety will decrease Outcome: Completed/Met   Problem: Elimination: Goal: Will not experience complications related to urinary retention Outcome: Completed/Met   Problem: Pain Managment: Goal: General experience of comfort will improve Outcome: Completed/Met   Problem: Safety: Goal: Ability to remain free from injury will improve Outcome: Completed/Met   Problem: Skin Integrity: Goal: Risk for impaired skin integrity will decrease Outcome: Completed/Met

## 2022-02-09 NOTE — Progress Notes (Signed)
     Daily Progress Note Intern Pager: [phone]  Patient name: MANLEY FASON Medical record number: [id number] Date of birth: 1980/08/19 Age: 41 y.o. Gender: male  Primary Care Provider: Sharilyn Sites, MD Consultants: Cardiology Code Status: Full  Pt Overview and Major Events to Date:  7/30 - admitted  Assessment and Plan:  NECO KLING is a 41 y.o. male presenting with abdominal pain, SOB, and leg swelling 2/2 CHF exacerbation. His PMH includes CHF s/p aortic valvuloplasty, CKD3, HTN, and inguinal hernia.  * CHF exacerbation (HCC) Stable on RA. UOP 1.6L since admission. Weight down 0.7 kg since admission. K 4.2.  -IV lasix 80 mg BID -Cards following, appreciate recs -Supplemental O2 as needed, continuous pulse ox -Strict I/Os, daily weights -Continue home coreg 25 mg BID -AM CBC, CMP  AKI on CKD3 Cr improving to 2.33, baseline around 1.60. Suspect 2/2 fluid overload. -Continue diuresis as above, expect Cr to improve  Constipation Uncontrolled, subacute. -Continue miralax, senna daily and assess response   FEN/GI: Heart healthy PPx: Lovenox Dispo:Home pending clinical improvement .  Subjective:  Doing well this morning without complaints. Abdominal pain and overall swelling have improved slightly with increased UOP.  Objective: Temp:  [97.6 F (36.4 C)-98.2 F (36.8 C)] 98.1 F (36.7 C) (07/31 0500) Pulse Rate:  [76-93] 76 (07/31 0500) Resp:  [14-33] 18 (07/31 0500) BP: (94-134)/(59-80) 105/67 (07/31 0500) SpO2:  [97 %-100 %] 100 % (07/31 0500) Weight:  [104.3 kg-105 kg] 104.3 kg (07/31 0500) Physical Exam: General: Alert and oriented, in NAD Skin: Warm, dry HEENT: NCAT, EOM grossly normal, midline nasal septum Cardiac: RRR, no m/r/g appreciated Respiratory: CTAB, breathing and speaking comfortably on RA Abdominal: Soft, TTP of mid lower abdomen and bilateral flanks, normoactive bowel sounds Extremities: 2+ pitting edema of bilateral LE to below  knees Neurological: No gross focal deficit Psychiatric: Appropriate mood and affect   Laboratory: Most recent CBC Lab Results  Component Value Date   WBC 5.1 02/09/2022   HGB 12.9 (L) 02/09/2022   HCT 38.7 (L) 02/09/2022   MCV 83.0 02/09/2022   PLT 222 02/09/2022   Most recent BMP    Latest Ref Rng & Units 02/09/2022    6:24 AM  BMP  Glucose 70 - 99 mg/dL 95   BUN 6 - 20 mg/dL 32   Creatinine 0.61 - 1.24 mg/dL 2.33   Sodium 135 - 145 mmol/L 136   Potassium 3.5 - 5.1 mmol/L 4.2   Chloride 98 - 111 mmol/L 108   CO2 22 - 32 mmol/L 21   Calcium 8.9 - 10.3 mg/dL 8.6    Hgb A1c 6.6 Mg 1.8 HDL 24  Ethelene Hal, MD 02/09/2022, 7:35 AM PGY-1, [place] Intern pager: [phone], text pages welcome Secure chat group [facility] Teaching Service

## 2022-02-09 NOTE — Progress Notes (Signed)
Orthopedic Tech Progress Note Patient Details:  [name] [name] [birth year] [id number]  Ortho Devices Type of Ortho Device: Haematologist Ortho Device/Splint Location: BLE Ortho Device/Splint Interventions: Ordered, Application, Adjustment   Post Interventions Patient Tolerated: Well Instructions Provided: Care of device  Janit Pagan 02/09/2022, 5:30 PM

## 2022-02-09 NOTE — Consult Note (Signed)
   [name] Inpatient Consult   02/09/2022  [name] [name] [birth year] [id number]  [name] Patient: [name] [name] [facility] [place]  Primary Care Provider:  Sharilyn Sites, MD, at [name], is an embedded provider with a Chronic Care Management team and program, and is listed for the transition of care follow up and appointments.  Patient was screened for Embedded practice service needs for chronic care management and was in observation status at the time of this screening.  1300: Met with patient at the bedside, pt endorses PCP. Gave him a 24 hour magnet reminder to follow up with primary care. He states no issues at current time with SDOH for transportation, medications [financial], or food insecurity needs,  follows with the HF clinic.  Plan: Continue to follow.  Please contact for further questions,  [name] Brood, RN BSN [name] Liaison  [phone] business mobile phone Toll free office [phone]  Fax number: [phone] [name]

## 2022-02-09 NOTE — Progress Notes (Signed)
Heart Failure Navigator Progress Note  Assessed for Heart & Vascular TOC clinic readiness.  Patient does not meet criteria due to already established with advanced CHF clinic.   Kerby Nora, PharmD, BCPS Heart Failure Stewardship Pharmacist Phone [phone]

## 2022-02-09 NOTE — Consult Note (Addendum)
Advanced Heart Failure Team Consult Note   Primary Physician: Sharilyn Sites, MD PCP-Cardiologist:  Glori Bickers, MD  Reason for Consultation: Acute on chronic systolic heart failure  HPI:    [name] [name] is seen today for evaluation of Acute on chronic systolic heart failure at the request of Dr. Sallyanne Kuster.   [name] [name] is a 41 y.o.with systolic HF due to  NICM, hypertension and CKD stage II-III (baseline Cr 1.5-1.7).    He was first diagnosed with HF in 2012 and his EF recovered and was instructed to stop taking carvedilol, spironolactone, and lasix by previous cardiologist. Admitted to [facility] 8/84/16 for acute systolic heart failure. 12/31/11 ProBNP 3777. HIV nonreactive. Thyroid panel normal. Renal ultrasound was negative for obstruction. EF 20%. Discharge weight 193 lbs. Cath with normal coronaries.   Admitted 05/30/2015 with HTN urgency with troponin elevation likely consistent with myocardial strain/demand ischemia in the setting of medication non compliance, having run out 2-3 weeks ago and not refilling them. Placed back on his coreg, hydralazine, and spiro and was symptomatically stable on discharge. No ACE/ARB with CKD and does not tolerate nitrates with headaches. Discharge weight 193 lbs.   Underwent TEE in 9/18 with severe AI. EF 40-45%. Cath with normal coronaries. S/P AV repair 05/11/2017 with Dr. Roxy Manns.   PYP scan negative 10/18   Echo 02/13/19. EF 45% w/ moderate AI. EF out of range for ICD. Echo 3/21 EF 45% moderate AI. LV dimensions stable.    Seen in clinic 09/14/19 and complained of intermittent palpitations. Echo stable  Zio patch NSR w/ 1 brief run of NSVT (4 beats), 4 brief runs of SVT and rare PVCs < 1% burden.    ED visit 10/02/20 with volume overload. Reds Clip 42%. Instructed to increase lasix to 80 mg daily however the medication script remained 40 mg daily. POC US showed EF 35%.    Echo 10/22 EF 30-35% LV dilated. Mild-mod AI Mod-severe central MR.     Follow up 4/23, stable NYHA II. Plan to repeat echo to evaluate AI and MR.   Admitted 6/23 with chest pain, found to be in a/c CHF. Diuresed with IV lasix. Wilder Glade stopped at patient's request d/t sudden leg swelling.    TEE 01/20/22: LVEF 20-25%, LV mildly dilated, RV function severely reduced, size is normal. LA severely dilated. RA moderately dilated. Mod MR. Mod TR. Aortic valve has been replaced, regurgitation is severe. Pulmonic valve regurg mod. Pt presented fluid overloaded, '80mg'$  IV lasix given and Furoscix ordered.   In the ED he received 80 lasix IV with good diuresis. BNP 3036, HsTrop 48, CXR: New mild bilateral interstitial edema or infiltrates, Stable cardiomegaly. CT Signs of anasarca with small R effusion, pulmonary edema,ascites and body wall edema. Admitted for further diuresis.   Sitting up in chair. He's been sleeping on couch for the last year d/t orthopnea. Discharged 6/15 weighting 208lbs>>at 231lbs this admission. Reports starting Furoscix 7/23 with good UOP during the first dose, continued to use it every other day with decreased effectiveness per patient. Worsening SOB and leg swelling throughout the past month, minimal improvement with interventions. Reports taking all his medications as prescribed. Denies CP. Wife at bedside.    Cardiac Studies: - Echo (2/14): EF 20-25% - Echo (3/16): EF 40%  - Echo (11/16): EF 20-25% - Echo (11/17): EF 45-50% - Echo (9/18): EF ~45% Severe AI  - Echo (12/18): EF 45-50% . Grade II DD - Echo (2019): EF 30-35%  Moderate aortic  regurgitation. RV moderately reduced.  - Echo (8/20): EF 45%, moderate AI - Echo (3/21): EF 45%, Grade II DD, moderate AI  - POC [place] (3/22): EF 35% - Echo (10/22): EF 30-35%, LV dilated, mild-mod AI, moderate-severe central MR. - Echo (6/23): EF 20-25%, severe biventricular dysfunction, mild to moderate MR, severe AI (V max  2.9 m/s, MG 16 mmHG.) - TEE 01/20/22: LVEF 20-25%, LV mildly dilated, RV function severely  reduced, size is normal. LA severely dilated. RA moderately dilated. Mod MR. Mod TR. Aortic valve has been replaced, regurgitation is severe. Pulmonic valve regurg mod.   Review of Systems: [y] = yes, '[ ]'$  = no   General: Weight gain '[ ]'$ ; Weight loss '[ ]'$ ; Anorexia '[ ]'$ ; Fatigue '[ ]'$ ; Fever '[ ]'$ ; Chills '[ ]'$ ; Weakness '[ ]'$   Cardiac: Chest pain/pressure '[ ]'$ ; Resting SOB '[ ]'$ ; Exertional SOB [ Y]; Orthopnea [ Y]; Pedal Edema [Y ]; Palpitations '[ ]'$ ; Syncope '[ ]'$ ; Presyncope '[ ]'$ ; Paroxysmal nocturnal dyspnea'[ ]'$   Pulmonary: Cough [Y ]; Wheezing'[ ]'$ ; Hemoptysis'[ ]'$ ; Sputum '[ ]'$ ; Snoring '[ ]'$   GI: Vomiting'[ ]'$ ; Dysphagia'[ ]'$ ; Melena'[ ]'$ ; Hematochezia '[ ]'$ ; Heartburn'[ ]'$ ; Abdominal pain '[ ]'$ ; Constipation '[ ]'$ ; Diarrhea '[ ]'$ ; BRBPR '[ ]'$   GU: Hematuria'[ ]'$ ; Dysuria '[ ]'$ ; Nocturia'[ ]'$   Vascular: Pain in legs with walking '[ ]'$ ; Pain in feet with lying flat '[ ]'$ ; Non-healing sores '[ ]'$ ; Stroke '[ ]'$ ; TIA '[ ]'$ ; Slurred speech '[ ]'$ ;  Neuro: Headaches'[ ]'$ ; Vertigo'[ ]'$ ; Seizures'[ ]'$ ; Paresthesias'[ ]'$ ;Blurred vision '[ ]'$ ; Diplopia '[ ]'$ ; Vision changes '[ ]'$   Ortho/Skin: Arthritis '[ ]'$ ; Joint pain '[ ]'$ ; Muscle pain '[ ]'$ ; Joint swelling '[ ]'$ ; Back Pain '[ ]'$ ; Rash '[ ]'$   Psych: Depression'[ ]'$ ; Anxiety'[ ]'$   Heme: Bleeding problems '[ ]'$ ; Clotting disorders '[ ]'$ ; Anemia '[ ]'$   Endocrine: Diabetes '[ ]'$ ; Thyroid dysfunction'[ ]'$   Home Medications Prior to Admission medications   Medication Sig Start Date End Date Taking? Authorizing Provider  albuterol (VENTOLIN HFA) 108 (90 Base) MCG/ACT inhaler Inhale 2 puffs into the lungs every 6 (six) hours as needed for wheezing or shortness of breath. 12/25/21  Yes Emokpae, Courage, MD  allopurinol (ZYLOPRIM) 300 MG tablet Take 1 tablet (300 mg total) by mouth daily. 06/12/21  Yes Sanjuana Kava, MD  ALPRAZolam Duanne Moron) 0.5 MG tablet Take 0.5 mg by mouth at bedtime. 09/18/20  Yes [provider]  carvedilol (COREG) 25 MG tablet Take 1 tablet (25 mg total) by mouth 2 (two) times daily with a meal. 12/25/21  Yes Emokpae, Courage, MD   ENTRESTO 97-103 MG Take 1 tablet by mouth twice daily 01/21/22  Yes Bensimhon, Shaune Pascal, MD  FUROSCIX 80 MG/10ML CTKT Inject 80 mg into the skin. 01/27/22  Yes [provider]  furosemide (LASIX) 80 MG tablet Take 80 mg by mouth daily.   Yes [provider]  potassium chloride SA (KLOR-CON M) 20 MEQ tablet Take 20 mEq by mouth daily. 01/13/22  Yes [provider]    Past Medical History: Past Medical History:  Diagnosis Date   Anxiety    CHF (congestive heart failure) (HCC)    Chronic systolic heart failure (HCC)    CKD (chronic kidney disease) stage 2, GFR 60-89 ml/min    Dyspnea    with value issues- "if I dont take my medication"   Essential hypertension, benign    Headache    History of pneumonia    Mitral regurgitation  Moderate   Noncompliance    Nonischemic cardiomyopathy (HCC)    Normal coronaries May 2012, LVEF < 20%   Pneumonia 2011   S/P aortic valve repair 05/11/2017   Complex valvuloplasty including plication of left coronary leaflet and 40m Biostable HAART ring annuloplasty    Past Surgical History: Past Surgical History:  Procedure Laterality Date   AORTIC VALVE REPAIR N/A 05/11/2017   Procedure: AORTIC VALVE REPAIR;  Surgeon: ORexene Alberts MD;  [facility]  Service: Open Heart Surgery;  Laterality: N/A;   CARDIAC SURGERY     RIGHT/LEFT HEART CATH AND CORONARY ANGIOGRAPHY N/A 04/20/2017   Procedure: RIGHT/LEFT HEART CATH AND CORONARY ANGIOGRAPHY;  Surgeon: BJolaine Artist MD;  [facility];  Service: Cardiovascular;  Laterality: N/A;   SURGERY SCROTAL / TESTICULAR     Testicular torsion   TEE WITHOUT CARDIOVERSION N/A 04/09/2017   Procedure: TRANSESOPHAGEAL ECHOCARDIOGRAM (TEE);  Surgeon: BJolaine Artist MD;  [facility];  Service: Cardiovascular;  Laterality: N/A;   TEE WITHOUT CARDIOVERSION N/A 05/11/2017   Procedure: TRANSESOPHAGEAL ECHOCARDIOGRAM (TEE);  Surgeon: ORexene Alberts MD;   [facility]  Service: Open Heart Surgery;  Laterality: N/A;   TEE WITHOUT CARDIOVERSION N/A 01/20/2022   Procedure: TRANSESOPHAGEAL ECHOCARDIOGRAM (TEE);  Surgeon: BJolaine Artist MD;  [facility];  Service: Cardiovascular;  Laterality: N/A;    Family History: Family History  Problem Relation Age of Onset   Breast cancer Mother    Stroke Father    Hypertension Father     Social History: Social History   Socioeconomic History   Marital status: Married    Spouse name: RaShannon   Number of children: 0   Years of education: Not on file   Highest education level: Some college, no degree  Occupational History    Employer: [employer]  Tobacco Use   Smoking status: Never   Smokeless tobacco: Never  Vaping Use   Vaping Use: Never used  Substance and Sexual Activity   Alcohol use: Yes    Alcohol/week: 0.0 standard drinks of alcohol    Comment: occasional   Drug use: No   Sexual activity: Yes    Birth control/protection: Condom  Other Topics Concern   Not on file  Social History Narrative   Occupation: just started sanitation job cleaning      Patient is right-handed. He lives with his wife in a 1 story house. He drinks 1-2 sodas a day.    Social Determinants of Health   Financial Resource Strain: Not on file  Food Insecurity: Not on file  Transportation Needs: No Transportation Needs (09/28/2019)   PRAPARE - THydrologist(Medical): No    Lack of Transportation (Non-Medical): No  Physical Activity: Insufficiently Active (09/28/2019)   Exercise Vital Sign    Days of Exercise per Week: 4 days    Minutes of Exercise per Session: 30 min  Stress: Not on file  Social Connections: Not on file    Allergies:  No Known Allergies  Objective:    Vital Signs:   Temp:  [97.5 F (36.4 C)-98.2 F (36.8 C)] 97.5 F (36.4 C) (07/31 0753) Pulse Rate:  [76-93] 86 (07/31 0753) Resp:  [14-33] 18 (07/31 0753) BP:  (94-134)/(59-80) 110/76 (07/31 0753) SpO2:  [97 %-100 %] 99 % (07/31 0753) Weight:  [104.3 kg-105 kg] 104.3 kg (07/31 0500) Last BM Date :  (Miralax was given today)  Weight change: [employer]  02/08/22 1709 02/09/22 0500  Weight: 105 kg 104.3 kg    Intake/Output:   Intake/Output Summary (Last 24 hours) at 02/09/2022 0852 Last data filed at 02/09/2022 0849 Gross per 24 hour  Intake 360 ml  Output 2025 ml  Net -1665 ml      Physical Exam  General:  well appearing. Looks stated age. No respiratory difficulty HEENT: normal Neck: supple. JVD to jaw. Carotids 2+ bilat; no bruits. No lymphadenopathy or thyromegaly appreciated. Cor: PMI nondisplaced. Regular rate & rhythm. No rubs, gallops. + diastolic murmur Lungs: clear, diminished at bases Abdomen: soft, nontender, nondistended. No hepatosplenomegaly. No bruits or masses. Good bowel sounds. Extremities: no cyanosis, clubbing, rash, +3 edema BLE Neuro: alert & oriented x 3, cranial nerves grossly intact. moves all 4 extremities w/o difficulty. Affect pleasant.   Telemetry   80-90's NSR (Personally reviewed)    EKG    Sinus rhythm with 1st degree AV block with occasional PVC's, possible LA enlargement, LVH with QRS widening 81 bpm  Labs   Basic Metabolic Panel: Recent Labs  Lab 02/08/22 0822 02/09/22 0624  NA 136 136  K 4.1 4.2  CL 108 108  CO2 21* 21*  GLUCOSE 125* 95  BUN 31* 32*  CREATININE 2.63* 2.33*  CALCIUM 8.7* 8.6*  MG  --  1.8    Liver Function Tests: Recent Labs  Lab 02/08/22 0822 02/09/22 0624  AST 25 19  ALT 22 20  ALKPHOS 65 57  BILITOT 1.4* 1.3*  PROT 5.3* 5.2*  ALBUMIN 2.9* 2.7*   Recent Labs  Lab 02/08/22 0822  LIPASE 32   No results for input(s): "AMMONIA" in the last 168 hours.  CBC: Recent Labs  Lab 02/08/22 0822 02/09/22 0624  WBC 5.8 5.1  HGB 13.3 12.9*  HCT 41.4 38.7*  MCV 85.4 83.0  PLT 226 222    Cardiac Enzymes: No results for input(s): "CKTOTAL", "CKMB",  "CKMBINDEX", "TROPONINI" in the last 168 hours.  BNP: BNP (last 3 results) Recent Labs    12/22/21 1908 01/05/22 1605 02/08/22 0822  BNP 3,079.0* 3,506.9* 3,036.5*    ProBNP (last 3 results) No results for input(s): "PROBNP" in the last 8760 hours.   CBG: No results for input(s): "GLUCAP" in the last 168 hours.  Coagulation Studies: No results for input(s): "LABPROT", "INR" in the last 72 hours.   Imaging   CT ABDOMEN PELVIS WO CONTRAST  Result Date: 02/08/2022 CLINICAL DATA:  RIGHT lower quadrant abdominal pain, question of hernia. EXAM: CT ABDOMEN AND PELVIS WITHOUT CONTRAST TECHNIQUE: Multidetector CT imaging of the abdomen and pelvis was performed following the standard protocol without IV contrast. RADIATION DOSE REDUCTION: This exam was performed according to the departmental dose-optimization program which includes automated exposure control, adjustment of the mA and/or kV according to patient size and/or use of iterative reconstruction technique. COMPARISON:  No recent prior imaging is available for comparison aside from CT of the chest from March of 2022. FINDINGS: Lower chest: Small RIGHT-sided effusion. Mild septal thickening. No consolidation. Marked cardiomegaly. Signs of prior aortic valve replacement and median sternotomy, incompletely evaluated. Hepatobiliary: Lobular and nodular hepatic contours. No visible lesion on noncontrast imaging. No gallbladder distension or focal pericholecystic stranding. No gross biliary duct distension. Pancreas: There is some stranding about the pancreas. The contour the pancreas is normal. There is no focal fluid about the pancreas. There is generalized edema in the abdomen and there is ascites. Spleen: Normal size and contour. Adrenals/Urinary Tract: Adrenal glands are normal. Smooth renal  contours. Perinephric stranding bilaterally. No sign of hydronephrosis. Urinary bladder is collapsed. No visible calculi in the kidneys or ureters.  Stomach/Bowel: No signs of bowel obstruction or acute bowel process. No bowel containing hernia. Appendix is normal. Colon without signs of inflammation or obstruction with colonic diverticulosis throughout the colon. Vascular/Lymphatic: Aortic atherosclerosis. No signs of aneurysmal dilation of the abdominal aorta. There is no gastrohepatic or hepatoduodenal ligament lymphadenopathy. No retroperitoneal or mesenteric lymphadenopathy. No pelvic sidewall lymphadenopathy. Reproductive: Unremarkable by CT. Other: Moderate RIGHT inguinal hernia contains fat and a small amount of ascites. There is small volume ascites in the abdomen and the pelvis this is mainly along the RIGHT hepatic margin and about the pelvis and to a lesser extent along the LEFT paracolic gutter in and about the spleen. Diffuse body wall edema. Musculoskeletal: No acute or significant osseous findings. IMPRESSION: 1. Signs of anasarca with small RIGHT effusion, pulmonary edema, ascites and body wall edema. 2. Marked cardiomegaly. 3. Lobular and nodular hepatic contours, suggestive of cirrhosis perhaps cardiogenic but without overt signs of portal hypertension. Correlate with liver enzymes. 4. Moderate RIGHT inguinal hernia contains fat and a small amount of ascites. 5. Perhaps more pronounced stranding about the central abdominal small bowel mesentery in the pancreas. Correlate with pancreatic enzymes and with any abdominal symptoms that would suggest pancreatitis or duodenitis though findings are favored to represent sequela of anasarca. 6. Colonic diverticulosis without evidence of inflammation or obstruction. 7. Aortic atherosclerosis. Aortic Atherosclerosis (ICD10-I70.0). Electronically Signed   By: Zetta Bills M.D.   On: 02/08/2022 10:06     Medications:     Current Medications:  allopurinol  300 mg Oral Daily   carvedilol  25 mg Oral BID WC   enoxaparin (LOVENOX) injection  30 mg Subcutaneous Q24H   furosemide  80 mg Intravenous  BID   pneumococcal 20-valent conjugate vaccine  0.5 mL Intramuscular Tomorrow-1000   polyethylene glycol  17 g Oral Daily   potassium chloride SA  20 mEq Oral Daily   senna-docusate  1 tablet Oral QHS    Infusions:    Patient Profile   [name] [name] is a 41 y.o.with systolic HF due to  NICM, hypertension and CKD stage II-III (baseline Cr 1.5-1.7) here for acute on chronic systolic heart failure.  Assessment/Plan   1) Chronic systolic HF: NICM,   - Echo ~20% in 2013. EF 45-50% in 2016. Suspect due to ongoing AI +/- HTN - s/p AoV repair 10/18. F/u echo 12/18 with EF 45-50% moderate AI - Echo (2019): EF 30-35% Mod AI.  - Echo (8/20): EF 45% (out of range for ICD) - Echo (3/21): EF 45% moderate AI. LV dimensions stable (LVIDd 6.5cm) - Echo (10/22): EF 30-35% LV dilated. Mild-mod AI Mod-severe central MR (LVIDd 6.0cm)  - Echo (6/23): EF 20-25%, severe biventricular dysfunction, mild to moderate MR, severe AI (V max 2.9 m/s, MG 16 mmHG.) - TEE 01/20/22: LVEF 20-25%, LV mildly dilated, RV function severely reduced, size is normal. LA severely dilated. RA moderately dilated. Mod MR. Mod TR. Aortic valve has been replaced, regurgitation is severe. Pulmonic valve regurg mod.  - On admission presented with NYHA IIIb. BNP 3036, HsTrop 48, CXR: New mild bilateral interstitial edema or infiltrates, Stable cardiomegaly. CT Signs of anasarca with small R effusion, pulmonary edema,ascites and body wall edema.  - Discharged 12/25/21 weighting 208lbs>>at 231lbs this admission. - Volume overloaded. JVD to jaw.  - Continue lasix '80mg'$  IV BID, + metolazone 2.5 once. If sluggish diuresis or  worsening AKI, consider empiric Milrinone gtt - Decrease Coreg 25 mg bid to 12.5 with concern for low output. - Hold Entresto 97/103 mg bid in setting of AKI - Hydralazine stopped with low BP (did not tolerate Imdur with headaches). - Willing to retry hydralizine/nitrates if afterload reduction needed.  - Has not been on  spiro due to hyperkalemia. - Did not tolerate Wilder Glade (says it made his legs swell). - Place PICC, daily co-ox. CVP Qshift. - Place UNNA boots - Will likely need transplant eval for recurrent AI and severe cardiomyopathy - RHC prior to d/c - CPX testing once discharged - Strict I&O, daily weights   2) S/P AV repair 05/01/2017 - LV remains down and severely dilated on last echo (6/23).  - 01/20/22 TEE shows Aortic valve has been replaced, regurgitation is severe. - Consider referral to [facility] for valve surgery vs transplant   3) Mitral regurgitation - functional. Moderate to severe MR on echo 6/23. - MV peak gradient 9.1 mmHg, mean mitral valve gradient 2.0 mmHg. - TEE 6/23 mild/mod   4) HTN   - Now on the low side. - reduced Coreg to 12.5 BID   5) Acute on chronic CKD stage III   - baseline Cr 1.5-1.7  - 2.6 on admission. 2.3 today.  - suspect low output cardiorenal - diuretics per above, low threshold to start inotropes if worsens - Off SGLT2i. - Daily BMET    Length of Stay: [place], AGACNP-BC  02/09/2022, 8:52 AM  Advanced Heart Failure Team Pager [phone] (M-F; 7a - 5p)  Please contact [facility] for night-coverage after hours (4p -7a ) and weekends on [email or web address]   Patient seen with NP, agree with the above note.   History as noted above.  Patient admitted with worsening dyspnea despite attempt to augment diuresis as outpatient.  Weight up significantly.  Had AKI on CKD 3 with creatinine initially 2.6, now down to 2.3 today.    General: NAD Neck: JVP 14-16 cm, no thyromegaly or thyroid nodule.  Lungs: Clear to auscultation bilaterally with normal respiratory effort. CV: Nondisplaced PMI.  Heart regular S1/S2, no Y8/X4, 3/6 diastolic murmur.  2+ edema to knees.  No carotid bruit.  Normal pedal pulses.  Abdomen: Soft, nontender, no hepatosplenomegaly, no distention.  Skin: Intact without lesions or rashes.  Neurologic: Alert and oriented x 3.  Psych:  Normal affect. Extremities: No clubbing or cyanosis.  HEENT: Normal.   Patient is significantly volume overloaded on exam.  He has NICM with TEE in 7/23 showing EF 20-25% with severe RV dysfunction and severe AI (s/p aortic valve repair).  Plan has been to try to get him evaluation for transplant at [facility]. Creatinine now up from baseline 1.5-1.7 to 2.3 today.   - Agree with Lasix 80 mg IV bid with metolazone 2.5 x 1 today.  - Stop Entresto for now with AKI on CKD.   - Decrease Coreg to 12.5 mg bid.  - Place PICC and follow CVP + co-ox.  If co-ox is low, will start on milrinone gtt.   Loralie Champagne 02/09/2022 3:20 PM

## 2022-02-09 NOTE — Progress Notes (Signed)
Peripherally Inserted Central Catheter Placement  The IV Nurse has discussed with the patient and/or persons authorized to consent for the patient, the purpose of this procedure and the potential benefits and risks involved with this procedure.  The benefits include less needle sticks, lab draws from the catheter, and the patient may be discharged home with the catheter. Risks include, but not limited to, infection, bleeding, blood clot (thrombus formation), and puncture of an artery; nerve damage and irregular heartbeat and possibility to perform a PICC exchange if needed/ordered by physician.  Alternatives to this procedure were also discussed.  Bard Power PICC patient education guide, fact sheet on infection prevention and patient information card has been provided to patient /or left at bedside.    PICC Placement Documentation  PICC Double Lumen 83/15/17 Right Basilic 43 cm 0 cm (Active)  Indication for Insertion or Continuance of Line Chronic illness with exacerbations (CF, Sickle Cell, etc.) 02/09/22 1600  Exposed Catheter (cm) 0 cm 02/09/22 1600  Site Assessment Clean, Dry, Intact 02/09/22 1600  Lumen #1 Status Flushed;Blood return noted;Saline locked 02/09/22 1600  Lumen #2 Status Flushed;Blood return noted;Saline locked 02/09/22 1600  Dressing Type Transparent;Securing device 02/09/22 1600  Dressing Status Clean, Dry, Intact;Antimicrobial disc in place 02/09/22 1600  Dressing Change Due 02/16/22 02/09/22 1600       [name] [name] 02/09/2022, 4:28 PM

## 2022-02-09 NOTE — TOC Initial Note (Signed)
Transition of Care [facility]) - Initial/Assessment Note    Patient Details  Name: [name] [name] MRN: [id number] Date of Birth: 12/12/1980  Transition of Care [facility]) CM/SW Contact:    Erenest Rasher, RN Phone Number: [phone] 02/09/2022, 4:26 PM  Clinical Narrative:                  HF TOC CM spoke to pt at bedside. States his short-term disability paperwork was sent. He was independent prior to hospital stay. Drives to his appts. Will continue to follow for dc needs. Request meds come up from [facility].    Expected Discharge Plan: Home/Self Care Barriers to Discharge: Continued Medical Work up   Patient Goals and CMS Choice        Expected Discharge Plan and Services Expected Discharge Plan: Home/Self Care   Discharge Planning Services: CM Consult   Living arrangements for the past 2 months: Single Family Home                                      Prior Living Arrangements/Services Living arrangements for the past 2 months: Single Family Home   Patient language and need for interpreter reviewed:: Yes        Need for Family Participation in Patient Care: No (Comment) Care giver support system in place?: No (comment)   Criminal Activity/Legal Involvement Pertinent to Current Situation/Hospitalization: No - Comment as needed  Activities of Daily Living Home Assistive Devices/Equipment: Scales, Other (Comment) (medicine pump) ADL Screening (condition at time of admission) Patient's cognitive ability adequate to safely complete daily activities?: Yes Is the patient deaf or have difficulty hearing?: No Does the patient have difficulty seeing, even when wearing glasses/contacts?: No Does the patient have difficulty concentrating, remembering, or making decisions?: No Patient able to express need for assistance with ADLs?: Yes Does the patient have difficulty dressing or bathing?: No Independently performs ADLs?: Yes (appropriate for developmental  age) Does the patient have difficulty walking or climbing stairs?: No Weakness of Legs: None Weakness of Arms/Hands: None  Permission Sought/Granted Permission sought to share information with : Case Manager, Family Supports, PCP Permission granted to share information with : Yes, Verbal Permission Granted  Share Information with NAME: [name] [name]     Permission granted to share info w Relationship: wife  Permission granted to share info w Contact Information: [phone]  Emotional Assessment Appearance:: Appears stated age Attitude/Demeanor/Rapport: Engaged Affect (typically observed): Accepting Orientation: : Oriented to Self, Oriented to Place, Oriented to  Time, Oriented to Situation   Psych Involvement: No (comment)  Admission diagnosis:  Acute right-sided congestive heart failure ([place]) [I50.811] CHF exacerbation (HCC) [I50.9] Heart failure with acute decompensation, type unknown ([place]) [I50.9] Patient Active Problem List   Diagnosis Date Noted   Heart failure with acute decompensation, type unknown ([place]) 02/09/2022   CHF exacerbation ([place]) 02/08/2022   Inguinal hernia 02/08/2022   Constipation 02/08/2022   Anasarca    Acute heart failure reduced EF  12/23/2021   S/P aortic valve repair 05/11/2017   Aortic valve regurgitation    Chronic systolic CHF (congestive heart failure) ([place])    Hypertensive emergency 05/30/2015   Essential hypertension 11/22/2014   Shift work sleep disorder 10/30/2014   Obstructive sleep apnea 10/30/2014   Hypertensive crisis 09/26/2014   Hypertensive urgency 06/13/2013   CHF (congestive heart failure) ([place]) 06/13/2013   AKI on CKD3 06/13/2013  Nonischemic cardiomyopathy ([place]) 06/13/2013   HTN (hypertension) 02/25/2012   Chronic combined systolic and diastolic heart failure ([place]) 01/08/2012   Elevated troponin 01/01/2012   Hypokalemia 12/31/2011   Chest pain 12/31/2011   AKI (acute kidney injury) ([place]) 12/31/2011   PCP:   Sharilyn Sites, MD Pharmacy:   [place] [place] [facility] [address] [id number] [place] [address] Phone: [phone] Fax: [phone]     Social Determinants of Health (SDOH) Interventions    Readmission Risk Interventions     No data to display

## 2022-02-10 ENCOUNTER — Encounter (HOSPITAL_COMMUNITY): Payer: Self-pay | Admitting: Family Medicine

## 2022-02-10 DIAGNOSIS — R570 Cardiogenic shock: Secondary | ICD-10-CM | POA: Diagnosis not present

## 2022-02-10 DIAGNOSIS — I5043 Acute on chronic combined systolic (congestive) and diastolic (congestive) heart failure: Secondary | ICD-10-CM | POA: Diagnosis not present

## 2022-02-10 DIAGNOSIS — E118 Type 2 diabetes mellitus with unspecified complications: Secondary | ICD-10-CM | POA: Insufficient documentation

## 2022-02-10 DIAGNOSIS — I50811 Acute right heart failure: Secondary | ICD-10-CM | POA: Diagnosis not present

## 2022-02-10 DIAGNOSIS — N1831 Chronic kidney disease, stage 3a: Secondary | ICD-10-CM | POA: Diagnosis not present

## 2022-02-10 LAB — COMPREHENSIVE METABOLIC PANEL
ALT: 18 U/L (ref 0–44)
AST: 27 U/L (ref 15–41)
Albumin: 2.8 g/dL — ABNORMAL LOW (ref 3.5–5.0)
Alkaline Phosphatase: 53 U/L (ref 38–126)
Anion gap: 10 (ref 5–15)
BUN: 38 mg/dL — ABNORMAL HIGH (ref 6–20)
CO2: 23 mmol/L (ref 22–32)
Calcium: 8.4 mg/dL — ABNORMAL LOW (ref 8.9–10.3)
Chloride: 101 mmol/L (ref 98–111)
Creatinine, Ser: 2.3 mg/dL — ABNORMAL HIGH (ref 0.61–1.24)
GFR, Estimated: 36 mL/min — ABNORMAL LOW (ref >60)
Glucose, Bld: 90 mg/dL (ref 70–99)
Potassium: 3.7 mmol/L (ref 3.5–5.1)
Sodium: 134 mmol/L — ABNORMAL LOW (ref 135–145)
Total Bilirubin: 1.6 mg/dL — ABNORMAL HIGH (ref 0.3–1.2)
Total Protein: 5.1 g/dL — ABNORMAL LOW (ref 6.5–8.1)

## 2022-02-10 LAB — CBC
HCT: 37.3 % — ABNORMAL LOW (ref 39.0–52.0)
Hemoglobin: 12.4 g/dL — ABNORMAL LOW (ref 13.0–17.0)
MCH: 27.4 pg (ref 26.0–34.0)
MCHC: 33.2 g/dL (ref 30.0–36.0)
MCV: 82.5 fL (ref 80.0–100.0)
Platelets: 203 10*3/uL (ref 150–400)
RBC: 4.52 MIL/uL (ref 4.22–5.81)
RDW: 16.9 % — ABNORMAL HIGH (ref 11.5–15.5)
WBC: 4.7 10*3/uL (ref 4.0–10.5)
nRBC: 0 % (ref 0.0–0.2)

## 2022-02-10 LAB — COOXEMETRY PANEL
Carboxyhemoglobin: 2.5 % — ABNORMAL HIGH (ref 0.5–1.5)
Carboxyhemoglobin: 1.7 % — ABNORMAL HIGH (ref 0.5–1.5)
Methemoglobin: 0.7 % (ref 0.0–1.5)
Methemoglobin: <0.7 % (ref 0.0–1.5)
O2 Saturation: 92.7 %
O2 Saturation: 59.9 %
Total hemoglobin: 12.5 g/dL (ref 12.0–16.0)
Total hemoglobin: 12.9 g/dL (ref 12.0–16.0)

## 2022-02-10 LAB — MAGNESIUM: Magnesium: 1.8 mg/dL (ref 1.7–2.4)

## 2022-02-10 MED ORDER — MAGNESIUM SULFATE 2 GM/50ML IV SOLN
2.0000 g | Freq: Once | INTRAVENOUS | Status: AC
Start: 2022-02-10 — End: 2022-02-10
  Administered 2022-02-10: 2 g via INTRAVENOUS
  Filled 2022-02-10: qty 50

## 2022-02-10 MED ORDER — POTASSIUM CHLORIDE CRYS ER 20 MEQ PO TBCR
40.0000 meq | EXTENDED_RELEASE_TABLET | Freq: Once | ORAL | Status: AC
  Administered 2022-02-10: 40 meq via ORAL
  Filled 2022-02-10: qty 2

## 2022-02-10 NOTE — TOC CM/SW Note (Addendum)
HF TOC CM received referral for [facility], Chrys Racer with new referral. Faxed demographics, orders, progress notes, and Echo report to [place]. Pt may dc home with Home IV Milrinone. [facility] coordinator, Pam RN to make aware. Pt may need auth for Home Milrinone with insurance. Will need orders for Home IV Milrinone. [place], Heart Failure TOC CM [phone]

## 2022-02-10 NOTE — Progress Notes (Addendum)
Daily Progress Note Intern Pager: [phone]  Patient name: [name] [name] Medical record number: [id number] Date of birth: Nov 13, 1980 Age: 41 y.o. Gender: male  Primary Care Provider: Sharilyn Sites, MD Consultants: Cardiology Code Status: Full code  Pt Overview and Major Events to Date:  7/30 - admitted  Assessment and Plan:  [name] [name] is a 41yo M who presented with abdominal pain, SOB, and leg swelling likely 2/2 CHF exacerbation in the setting of severe aortic insufficiency. Pertinent PMH/PSH includes CHF s/p aortic valvuloplasty, CKD3, HTN, and inguinal hernia.   * CHF exacerbation (HCC) Stable on RA. Net -6.1L since admission. Weight down 3.5 kg since admission. K 3.7. Diastolic murmur on exam, has AI per cardiology and is a candidate for transplant. PICC in place. -Holding IV Lasix per cardiology -Milrinone drip given low co-ox -Cards following, appreciate recs -RHC prior to discharge per cardiology - scheduled for 8/2 per patient -Pt will need Lifevest -Pt will need eval with [facility] for transplant per cardiology -Supplemental O2 as needed, continuous pulse ox -Strict I/Os, daily weights -Holding home Coreg due to negative inotrope, on milrinone -Mg goal >=2, replete as needed -AM CBC, CMP, Mg, Co-ox  AKI on CKD3 Cr improving to 2.30, baseline around 1.60. Suspect 2/2 low renal output. -Continue diuresis as above, expect Cr to improve  Constipation Uncontrolled, subacute. -Continue miralax, senna daily and assess response  Diabetes mellitus without complication ([name] [name]) -Consider restarting Farxiga or Jardiance     FEN/GI: Heart healthy diet PPx: Lovenox Dispo:Home pending clinical improvement . Barriers include continued diuresis and plan for RHC with cardiology.   Subjective:   Feels better this morning, has been urinating frequently. Says he feels much improved since coming in. Still has some lower abdominal pain. Understands plan for diuresis and  RHC, patient reports it is scheduled for tomorrow.  Objective: Temp:  [97.6 F (36.4 C)-98 F (36.7 C)] 97.6 F (36.4 C) (08/01 0829) Pulse Rate:  [80-91] 91 (08/01 0829) Resp:  [16-18] 18 (08/01 0829) BP: (97-115)/(64-74) 100/64 (08/01 0829) SpO2:  [95 %-100 %] 95 % (08/01 0829) Weight:  [101.5 kg] 101.5 kg (08/01 0459)  VSS ON Wt down 2.8kg (6.2lbs) from yesterday, 3.5kg since admit I/O: PO 1L; UOP 5.7L. Net -4.5L last 24h, -6.1L since admit  Physical Exam: General: Sitting comfortably in bed, NAD Cardiovascular: RRR, +Diastolic murmur best appreciated over right upper sternal border Respiratory: CTAB, no crackles or wheezes Abdomen: Soft, non-distended, +TTP in RLQ and LLQ without guarding or rebound tenderness Extremities: 3+ BLE pitting edema  Laboratory: Most recent CBC Lab Results  Component Value Date   WBC 4.7 02/10/2022   HGB 12.4 (L) 02/10/2022   HCT 37.3 (L) 02/10/2022   MCV 82.5 02/10/2022   PLT 203 02/10/2022   Most recent BMP    Latest Ref Rng & Units 02/10/2022    5:40 AM  BMP  Glucose 70 - 99 mg/dL 90   BUN 6 - 20 mg/dL 38   Creatinine 0.61 - 1.24 mg/dL 2.30   Sodium 135 - 145 mmol/L 134   Potassium 3.5 - 5.1 mmol/L 3.7   Chloride 98 - 111 mmol/L 101   CO2 22 - 32 mmol/L 23   Calcium 8.9 - 10.3 mg/dL 8.4     Other pertinent labs   Mg  1.8 Co-ox: O2sat 92.7, carboxyhemoglobin 2.5 (h), methemoglobin wnl   PGY-1, [place] Intern pager: [phone], text pages welcome Secure chat group [place]  Hospital Teaching Service

## 2022-02-10 NOTE — Assessment & Plan Note (Addendum)
A1c 6.6 -Consider restarting Farxiga or Jardiance

## 2022-02-10 NOTE — Progress Notes (Signed)
Mobility Specialist Progress Note:   02/10/22 1401  Mobility  Activity Ambulated independently in hallway  Level of Assistance Independent after set-up  Assistive Device None  Distance Ambulated (ft) 1500 ft  Activity Response Tolerated well  $Mobility charge 1 Mobility   Pt in bed and agreeable. No complaints. Pt left in bed with all needs met.   [name] [name] Mobility Specialist-Acute Rehab Secure Chat only

## 2022-02-10 NOTE — Progress Notes (Addendum)
Advanced Heart Failure Rounding Note  PCP-Cardiologist: Glori Bickers, MD   Subjective:    Metolazone given once yesterday, continued '80mg'$  IV lasix BID. -4.5L. Down 7lbs overnight.   Stopped coreg. Placed UNNA boots.   PICC placed yesterday, co-ox 26% so Milrinone .375 gtt started. Today Co-ox 93%  Feels much better today. Able to ambulate in room. Denies SOB and CP.   Objective:   Weight Range: 101.5 kg Body mass index is 32.11 kg/m.   Vital Signs:   Temp:  [97.7 F (36.5 C)-98 F (36.7 C)] 97.7 F (36.5 C) (08/01 0459) Pulse Rate:  [80-84] 84 (07/31 2330) Resp:  [16-18] 16 (08/01 0459) BP: (97-115)/(65-74) 112/67 (08/01 0459) SpO2:  [96 %-100 %] 96 % (08/01 0459) Weight:  [101.5 kg] 101.5 kg (08/01 0459) Last BM Date : 02/08/22  Weight change: Filed Weights   02/08/22 1709 02/09/22 0500 02/10/22 0459  Weight: 105 kg 104.3 kg 101.5 kg    Intake/Output:   Intake/Output Summary (Last 24 hours) at 02/10/2022 0806 Last data filed at 02/10/2022 0509 Gross per 24 hour  Intake 1192.7 ml  Output 5700 ml  Net -4507.3 ml     CVP 10 Physical Exam    General:  ill appearing. Looks stated age. No respiratory difficulty HEENT: normal Neck: supple. JVD 10 cm. Carotids 2+ bilat; no bruits. No lymphadenopathy or thyromegaly appreciated. Cor: PMI nondisplaced. Regular rate & rhythm. No rubs, gallops or murmurs. Lungs: clear Abdomen: soft, nontender, nondistended. No hepatosplenomegaly. No bruits or masses. Good bowel sounds. Extremities: no cyanosis, clubbing, rash, +1 BLE edema. + UNNA boots BLE. PICC RUE. Neuro: alert & oriented x 3, cranial nerves grossly intact. moves all 4 extremities w/o difficulty. Affect pleasant.   Telemetry   NSR in 90's (Personally reviewed)    EKG    No new EKG to review  Labs    CBC Recent Labs    02/09/22 0624 02/10/22 0540  WBC 5.1 4.7  HGB 12.9* 12.4*  HCT 38.7* 37.3*  MCV 83.0 82.5  PLT 222 462   Basic Metabolic  Panel Recent Labs    02/09/22 0624 02/10/22 0540  NA 136 134*  K 4.2 3.7  CL 108 101  CO2 21* 23  GLUCOSE 95 90  BUN 32* 38*  CREATININE 2.33* 2.30*  CALCIUM 8.6* 8.4*  MG 1.8 1.8   Liver Function Tests Recent Labs    02/09/22 0624 02/10/22 0540  AST 19 27  ALT 20 18  ALKPHOS 57 53  BILITOT 1.3* 1.6*  PROT 5.2* 5.1*  ALBUMIN 2.7* 2.8*   Recent Labs    02/08/22 0822  LIPASE 32   Cardiac Enzymes No results for input(s): "CKTOTAL", "CKMB", "CKMBINDEX", "TROPONINI" in the last 72 hours.  BNP: BNP (last 3 results) Recent Labs    12/22/21 1908 01/05/22 1605 02/08/22 0822  BNP 3,079.0* 3,506.9* 3,036.5*    ProBNP (last 3 results) No results for input(s): "PROBNP" in the last 8760 hours.   D-Dimer No results for input(s): "DDIMER" in the last 72 hours. Hemoglobin A1C Recent Labs    02/09/22 0624  HGBA1C 6.6*   Fasting Lipid Panel Recent Labs    02/09/22 0624  CHOL 118  HDL 24*  LDLCALC 75  TRIG 94  CHOLHDL 4.9   Thyroid Function Tests Recent Labs    02/09/22 0624  TSH 2.861    Other results:   Imaging    [place] EKG SITE RITE  Result Date: 02/09/2022 If [facility]  image not attached, placement could not be confirmed due to current cardiac rhythm.    Medications:     Scheduled Medications:  allopurinol  300 mg Oral Daily   ALPRAZolam  0.5 mg Oral QHS   Chlorhexidine Gluconate Cloth  6 each Topical Daily   enoxaparin (LOVENOX) injection  40 mg Subcutaneous Q24H   furosemide  80 mg Intravenous BID   pneumococcal 20-valent conjugate vaccine  0.5 mL Intramuscular Tomorrow-1000   polyethylene glycol  17 g Oral Daily   potassium chloride SA  20 mEq Oral Daily   senna-docusate  1 tablet Oral QHS   sodium chloride flush  10-40 mL Intracatheter Q12H    Infusions:  milrinone 0.375 mcg/kg/min (02/10/22 0509)    PRN Medications: albuterol, sodium chloride flush    Patient Profile   [name] [name] is a 41 y.o.with systolic HF due to   NICM, hypertension and CKD stage II-III (baseline Cr 1.5-1.7) here for acute on chronic systolic heart failure.  Assessment/Plan   1) Chronic systolic HF: NICM,   - Echo ~20% in 2013. EF 45-50% in 2016. Suspect due to ongoing AI +/- HTN - s/p AoV repair 10/18. F/u echo 12/18 with EF 45-50% moderate AI - Echo (2019): EF 30-35% Mod AI.  - Echo (8/20): EF 45% (out of range for ICD) - Echo (3/21): EF 45% moderate AI. LV dimensions stable (LVIDd 6.5cm) - Echo (10/22): EF 30-35% LV dilated. Mild-mod AI Mod-severe central MR (LVIDd 6.0cm)  - Echo (6/23): EF 20-25%, severe biventricular dysfunction, mild to moderate MR, severe AI (V max 2.9 m/s, MG 16 mmHG.) - TEE 01/20/22: LVEF 20-25%, LV mildly dilated, RV function severely reduced, size is normal. LA severely dilated. RA moderately dilated. Mod MR. Mod TR. Aortic valve has been replaced, regurgitation is severe. Pulmonic valve regurg mod.  - On admission presented with NYHA IIIb. BNP 3036, HsTrop 48, CXR: New mild bilateral interstitial edema or infiltrates, Stable cardiomegaly. CT Signs of anasarca with small R effusion, pulmonary edema,ascites and body wall edema.  - Discharged 12/25/21 weighting 208lbs>>at 231lbs this admission. - Volume much improved. CVP 10. -4.5L UOP, down 7lbs.  - Continue lasix '80mg'$  IV BID - D/c'd coreg in setting of low output HF - Hold Entresto 97/103 mg bid in setting of AKI - Hydralazine stopped with low BP (did not tolerate Imdur with headaches). - Willing to retry hydralizine/nitrates if afterload reduction needed.  - Has not been on spiro due to hyperkalemia. - Did not tolerate Wilder Glade (says it made his legs swell). - PICC placed 7/31, daily co-ox. CVP Qshift. - Co-ox 29%, Milrinone .375 gtt started yesterday. Today Co-ox 93%. Continue Milrinone gtt.  - Continue UNNA boots - Will likely need transplant eval for recurrent AI and severe cardiomyopathy - RHC scheduled for Wednesday - CPX testing once discharged -  Strict I&O, daily weights   2) S/P AV repair 05/01/2017 - LV remains down and severely dilated on last echo (6/23).  - 01/20/22 TEE shows Aortic valve has been replaced, regurgitation is severe. - Consider referral to [facility] for valve surgery vs transplant   3) Mitral regurgitation - functional. Moderate to severe MR on echo 6/23. - MV peak gradient 9.1 mmHg, mean mitral valve gradient 2.0 mmHg. - TEE 6/23 mild/mod   4) HTN   - More stable today - D/c'd Coreg d/t low output HF   5) Acute on chronic CKD stage III   - baseline Cr 1.5-1.7  - 2.6 on admission. 2.3 today.  -  suspect low output cardiorenal - Continue '80mg'$  IV lasix BID - Off SGLT2i. - Daily BMET    Length of Stay: [place], AGACNP-BC  02/10/2022, 8:06 AM  Advanced Heart Failure Team Pager [phone] (M-F; 7a - 5p)  Please contact [name] for night-coverage after hours (5p -7a ) and weekends on [email or web address]  Patient seen with NP, agree with the above note.    Initial co-ox 26%, rose to 59.5% with milrinone 0.375.  Inaccurate co-ox this morning (92%).  He has diuresed very well with IV Lasix, I/Os net negative 4500 cc.  He had a dose of IV Lasix this morning.  CVP 7 on my measure today. Creatinine stable 2.3.   General: NAD Neck: No JVD, no thyromegaly or thyroid nodule.  Lungs: Clear to auscultation bilaterally with normal respiratory effort. CV: Nondisplaced PMI.  Heart regular S1/S2, no S3/S4, 2/6 SEM and 3/6 diastolic murmur along the sternal border.  No peripheral edema.   Abdomen: Soft, nontender, no hepatosplenomegaly, no distention.  Skin: Intact without lesions or rashes.  Neurologic: Alert and oriented x 3.  Psych: Normal affect. Extremities: No clubbing or cyanosis.  HEENT: Normal.   Continue current milrinone 0.375, will resend co-ox this morning.  With CVP 7, will hold further IV Lasix for now.  Hopefully, creatinine will start to trend down.    Long-term picture is concerning.  He was  admitted with low output HF in setting of biventricular severe dysfunction and severe AI.  He needs workup at [facility] for transplant.  He is feeling much better today and looks better.  I suspect that he is going to be inotrope dependent.  If he remains stable, will plan on home milrinone and evaluation asap at [facility] for transplant.  Will need Lifevest.    Loralie Champagne 02/10/2022 10:27 AM

## 2022-02-11 DIAGNOSIS — I5043 Acute on chronic combined systolic (congestive) and diastolic (congestive) heart failure: Secondary | ICD-10-CM | POA: Diagnosis not present

## 2022-02-11 DIAGNOSIS — E118 Type 2 diabetes mellitus with unspecified complications: Secondary | ICD-10-CM | POA: Diagnosis not present

## 2022-02-11 LAB — COOXEMETRY PANEL
Carboxyhemoglobin: 1.5 % (ref 0.5–1.5)
Methemoglobin: 1 % (ref 0.0–1.5)
O2 Saturation: 64.5 %
Total hemoglobin: 13.1 g/dL (ref 12.0–16.0)

## 2022-02-11 LAB — COMPREHENSIVE METABOLIC PANEL
ALT: 16 U/L (ref 0–44)
AST: 18 U/L (ref 15–41)
Albumin: 2.9 g/dL — ABNORMAL LOW (ref 3.5–5.0)
Alkaline Phosphatase: 58 U/L (ref 38–126)
Anion gap: 8 (ref 5–15)
BUN: 31 mg/dL — ABNORMAL HIGH (ref 6–20)
CO2: 27 mmol/L (ref 22–32)
Calcium: 8.6 mg/dL — ABNORMAL LOW (ref 8.9–10.3)
Chloride: 100 mmol/L (ref 98–111)
Creatinine, Ser: 2.06 mg/dL — ABNORMAL HIGH (ref 0.61–1.24)
GFR, Estimated: 41 mL/min — ABNORMAL LOW (ref >60)
Glucose, Bld: 90 mg/dL (ref 70–99)
Potassium: 3.7 mmol/L (ref 3.5–5.1)
Sodium: 135 mmol/L (ref 135–145)
Total Bilirubin: 1.7 mg/dL — ABNORMAL HIGH (ref 0.3–1.2)
Total Protein: 5.4 g/dL — ABNORMAL LOW (ref 6.5–8.1)

## 2022-02-11 LAB — CBC
HCT: 37.4 % — ABNORMAL LOW (ref 39.0–52.0)
Hemoglobin: 12.5 g/dL — ABNORMAL LOW (ref 13.0–17.0)
MCH: 27.2 pg (ref 26.0–34.0)
MCHC: 33.4 g/dL (ref 30.0–36.0)
MCV: 81.5 fL (ref 80.0–100.0)
Platelets: 211 10*3/uL (ref 150–400)
RBC: 4.59 MIL/uL (ref 4.22–5.81)
RDW: 16.8 % — ABNORMAL HIGH (ref 11.5–15.5)
WBC: 5.2 10*3/uL (ref 4.0–10.5)
nRBC: 0 % (ref 0.0–0.2)

## 2022-02-11 LAB — MAGNESIUM: Magnesium: 1.9 mg/dL (ref 1.7–2.4)

## 2022-02-11 MED ORDER — POTASSIUM CHLORIDE CRYS ER 20 MEQ PO TBCR
40.0000 meq | EXTENDED_RELEASE_TABLET | Freq: Once | ORAL | Status: DC
Start: 2022-02-11 — End: 2022-02-11

## 2022-02-11 MED ORDER — MAGNESIUM SULFATE 2 GM/50ML IV SOLN
2.0000 g | Freq: Once | INTRAVENOUS | Status: AC
  Administered 2022-02-11: 2 g via INTRAVENOUS
  Filled 2022-02-11: qty 50

## 2022-02-11 MED ORDER — FUROSEMIDE 10 MG/ML IJ SOLN
80.0000 mg | Freq: Once | INTRAMUSCULAR | Status: AC
  Administered 2022-02-11: 80 mg via INTRAVENOUS
  Filled 2022-02-11: qty 8

## 2022-02-11 MED ORDER — POTASSIUM CHLORIDE CRYS ER 20 MEQ PO TBCR
40.0000 meq | EXTENDED_RELEASE_TABLET | Freq: Once | ORAL | Status: AC
  Administered 2022-02-11: 40 meq via ORAL
  Filled 2022-02-11: qty 2

## 2022-02-11 NOTE — Progress Notes (Signed)
Daily Progress Note Intern Pager: [phone]  Patient name: [name] [name] Medical record number: [id number] Date of birth: 1980/11/07 Age: 41 y.o. Gender: male  Primary Care Provider: Sharilyn Sites, MD Consultants: Cardiology Code Status: Full code  Pt Overview and Major Events to Date:  7/30 - admitted  Assessment and Plan:  [name] [name] is a 41yo M who p/w abdominal pain, SOB, and leg swelling 2/2 CHF exacerbation in the setting of severe aortic insufficiency. Pertinent PMH/PSH includes CHF s/p aortic valvuloplasty, CKD3, HTN, and inguinal hernia..    * CHF exacerbation (HCC) Stable on RA. Net -10.7L since admission. Weight down 6.8 kg (15lb) since admission. K 3.7. Diastolic murmur on exam, has AI per cardiology and is a candidate for transplant. PICC in place. -Cards following, appreciate recs: --IV Lasix '80mg'$  x1 today since still above dry weight --Milrinone drip given low co-ox, will go home with milrinone  --Pt will need Lifevest, if not approved then maybe amiodarone --Pt will need eval with [facility] for transplant per cardiology --dc'd home Coreg due to negative inotrope, on milrinone --possible discharge tomorrow -Supplemental O2 as needed, continuous pulse ox -Strict I/Os, daily weights -Mg goal >=2, replete as needed -K goal >=4, replete as needed -AM CBC, CMP, Mg, Co-ox  AKI on CKD3 Cr improving to 2.06 (2.63 on admission), baseline around 1.60. Suspect 2/2 cardiorenal. -Continue treating CHF as above, expect Cr to improve  Constipation Uncontrolled, subacute. -Continue miralax, senna daily and assess response  Diabetes mellitus with complication (HCC) E3X 6.6 -Consider restarting Farxiga or Jardiance       FEN/GI: sch miralax, senokot PPx: Lovenox Dispo:Home pending clinical improvement . Barriers include cardiology recs, will need lifevest  Subjective:  Feeling better today, his belly pain has improved as well. Reports his cath procedure  was cancelled. No concerns.  Objective: Temp:  [97.8 F (36.6 C)-98.1 F (36.7 C)] 98 F (36.7 C) (08/02 0700) Pulse Rate:  [83-90] 90 (08/02 0922) Resp:  [17-19] 18 (08/02 0922) BP: (103-118)/(61-73) 112/69 (08/02 0922) SpO2:  [94 %-98 %] 96 % (08/02 0922) Weight:  [98.2 kg] 98.2 kg (08/02 0410)  VSS ON, on room air I/O: Net -4.58L last 24h; -10.7L since admit  Physical Exam: General: Sitting comfortably in bed, NAD Cardiovascular: RRR, 3/6 diastolic murmur and 2/6 systolic murmur best appreciated over RUSB Respiratory: CTAB, no wheezes or rales Abdomen: Soft, non-distended, mildly tender in LLQ, improved from yesterday, no guarding or rebound tenderness Extremities: 1+ BLE pitting edema, improved from yesterday  Laboratory: Most recent CBC Lab Results  Component Value Date   WBC 5.2 02/11/2022   HGB 12.5 (L) 02/11/2022   HCT 37.4 (L) 02/11/2022   MCV 81.5 02/11/2022   PLT 211 02/11/2022   Most recent BMP    Latest Ref Rng & Units 02/11/2022    4:32 AM  BMP  Glucose 70 - 99 mg/dL 90   BUN 6 - 20 mg/dL 31   Creatinine 0.61 - 1.24 mg/dL 2.06   Sodium 135 - 145 mmol/L 135   Potassium 3.5 - 5.1 mmol/L 3.7   Chloride 98 - 111 mmol/L 100   CO2 22 - 32 mmol/L 27   Calcium 8.9 - 10.3 mg/dL 8.6     Other pertinent labs   Mg 1.9 Coox O2sat 64.5    August Albino, MD 02/11/2022, 11:09 AM  PGY-1, [place] Intern pager: [phone], text pages welcome Secure chat group [place]

## 2022-02-11 NOTE — Progress Notes (Signed)
Mobility Specialist Progress Note:   02/11/22 1158  Mobility  Activity Ambulated with assistance in hallway  Level of Assistance Independent  Assistive Device None  Distance Ambulated (ft) 1000 ft  Activity Response Tolerated well  $Mobility charge 1 Mobility   Pt received EOB willing to participate in mobility. NO complaints of pain. Left in bed with call bell in reach and all needs met.   [facility] [place] Mobility Specialist

## 2022-02-11 NOTE — Progress Notes (Addendum)
Advanced Heart Failure Rounding Note  PCP-Cardiologist: Glori Bickers, MD   Subjective:    On Milrinone 0.375. Co-ox 65%.   Got 1 dose of IV Lasix yesterday w/ 5.3L in UOP. Wt down 7 lb. C/w thigh edema.  CVP 9-10. Still 8 lb above previous d/c wt in June.   Dyspnea improving.   SCr continues to trend down, 2.33>>2.30>>2.06 today    Objective:   Weight Range: 98.2 kg Body mass index is 31.06 kg/m.   Vital Signs:   Temp:  [97.8 F (36.6 C)-98.1 F (36.7 C)] 98 F (36.7 C) (08/02 0700) Pulse Rate:  [83-88] 88 (08/02 0700) Resp:  [17-19] 19 (08/02 0700) BP: (103-118)/(61-73) 118/73 (08/02 0700) SpO2:  [94 %-98 %] 96 % (08/02 0700) Weight:  [98.2 kg] 98.2 kg (08/02 0410) Last BM Date : 03/11/22  Weight change: Filed Weights   02/09/22 0500 02/10/22 0459 02/11/22 0410  Weight: 104.3 kg 101.5 kg 98.2 kg    Intake/Output:   Intake/Output Summary (Last 24 hours) at 02/11/2022 0904 Last data filed at 02/11/2022 0400 Gross per 24 hour  Intake 720 ml  Output 5300 ml  Net -4580 ml      Physical Exam    CVP 9-10  General:  Well appearing. No respiratory difficulty HEENT: normal Neck: supple. JVD 10 cm. Carotids 2+ bilat; no bruits. No lymphadenopathy or thyromegaly appreciated. Cor: PMI nondisplaced. Regular rate & rhythm. No rubs, gallops or murmurs. Lungs: decreased BS at the bases  Abdomen: soft, nontender, nondistended. No hepatosplenomegaly. No bruits or masses. Good bowel sounds. Extremities: no cyanosis, clubbing, rash, + b/l thigh edema + RUE PICC  Neuro: alert & oriented x 3, cranial nerves grossly intact. moves all 4 extremities w/o difficulty. Affect pleasant.   Telemetry   NSR in 90's, PVCs  (Personally reviewed)    EKG    No new EKG to review  Labs    CBC Recent Labs    02/10/22 0540 02/11/22 0432  WBC 4.7 5.2  HGB 12.4* 12.5*  HCT 37.3* 37.4*  MCV 82.5 81.5  PLT 203 518   Basic Metabolic Panel Recent Labs    02/10/22 0540  02/11/22 0432  NA 134* 135  K 3.7 3.7  CL 101 100  CO2 23 27  GLUCOSE 90 90  BUN 38* 31*  CREATININE 2.30* 2.06*  CALCIUM 8.4* 8.6*  MG 1.8 1.9   Liver Function Tests Recent Labs    02/10/22 0540 02/11/22 0432  AST 27 18  ALT 18 16  ALKPHOS 53 58  BILITOT 1.6* 1.7*  PROT 5.1* 5.4*  ALBUMIN 2.8* 2.9*   No results for input(s): "LIPASE", "AMYLASE" in the last 72 hours.  Cardiac Enzymes No results for input(s): "CKTOTAL", "CKMB", "CKMBINDEX", "TROPONINI" in the last 72 hours.  BNP: BNP (last 3 results) Recent Labs    12/22/21 1908 01/05/22 1605 02/08/22 0822  BNP 3,079.0* 3,506.9* 3,036.5*    ProBNP (last 3 results) No results for input(s): "PROBNP" in the last 8760 hours.   D-Dimer No results for input(s): "DDIMER" in the last 72 hours. Hemoglobin A1C Recent Labs    02/09/22 0624  HGBA1C 6.6*   Fasting Lipid Panel Recent Labs    02/09/22 0624  CHOL 118  HDL 24*  LDLCALC 75  TRIG 94  CHOLHDL 4.9   Thyroid Function Tests Recent Labs    02/09/22 0624  TSH 2.861    Other results:   Imaging    No results found.  Medications:     Scheduled Medications:  allopurinol  300 mg Oral Daily   ALPRAZolam  0.5 mg Oral QHS   Chlorhexidine Gluconate Cloth  6 each Topical Daily   enoxaparin (LOVENOX) injection  40 mg Subcutaneous Q24H   pneumococcal 20-valent conjugate vaccine  0.5 mL Intramuscular Tomorrow-1000   polyethylene glycol  17 g Oral Daily   potassium chloride SA  20 mEq Oral Daily   potassium chloride  40 mEq Oral Once   senna-docusate  1 tablet Oral QHS   sodium chloride flush  10-40 mL Intracatheter Q12H    Infusions:  magnesium sulfate bolus IVPB     milrinone 0.375 mcg/kg/min (02/11/22 0536)    PRN Medications: albuterol, sodium chloride flush    Patient Profile   [name] [name] is a 41 y.o.with systolic HF due to  NICM, hypertension and CKD stage II-III (baseline Cr 1.5-1.7) here for acute on chronic systolic heart  failure.  Assessment/Plan   1) Chronic systolic HF: NICM,   - Echo ~20% in 2013. EF 45-50% in 2016. Suspect due to ongoing AI +/- HTN - s/p AoV repair 10/18. F/u echo 12/18 with EF 45-50% moderate AI - Echo (2019): EF 30-35% Mod AI.  - Echo (8/20): EF 45% (out of range for ICD) - Echo (3/21): EF 45% moderate AI. LV dimensions stable (LVIDd 6.5cm) - Echo (10/22): EF 30-35% LV dilated. Mild-mod AI Mod-severe central MR (LVIDd 6.0cm)  - Echo (6/23): EF 20-25%, severe biventricular dysfunction, mild to moderate MR, severe AI (V max 2.9 m/s, MG 16 mmHG.) - TEE 01/20/22: LVEF 20-25%, LV mildly dilated, RV function severely reduced, size is normal. LA severely dilated. RA moderately dilated. Mod MR. Mod TR. Aortic valve has been replaced, regurgitation is severe. Pulmonic valve regurg mod.  - On admission presented with NYHA IIIb. BNP 3036, HsTrop 48, CXR: New mild bilateral interstitial edema or infiltrates, Stable cardiomegaly. CT Signs of anasarca with small R effusion, pulmonary edema,ascites and body wall edema.  - Discharged 12/25/21 weighting 208lbs>>at 231lbs this admission. - Started on Milrinone 0.375 for low co-ox (26%). Co-ox improved 65% today  - Volume much improved but remains volume overloaded. CVP 10  - Give another dose of IV Lasix 80 mg x 1 - D/c'd coreg in setting of low output HF - Hold Entresto 97/103 mg bid in setting of AKI - Hydralazine stopped with low BP (did not tolerate Imdur with headaches). - Willing to retry hydralizine/nitrates if afterload reduction needed.  - Has not been on spiro due to hyperkalemia. - Did not tolerate Wilder Glade (says it made his legs swell). - Continue UNNA boots - Will likely need transplant eval for recurrent AI and severe cardiomyopathy. Refer to [facility] for transplant w/u  - Plan home milrinone for bridge to transplant - LifeVest at d/c (order placed)     2) S/P AV repair 05/01/2017 - LV remains down and severely dilated on last echo (6/23).   - 01/20/22 TEE shows Aortic valve has been replaced, regurgitation is severe. - Plan referral to [facility] for transplant   3) Mitral regurgitation - functional. Moderate to severe MR on echo 6/23. - MV peak gradient 9.1 mmHg, mean mitral valve gradient 2.0 mmHg. - TEE 6/23 mild/mod   4) HTN   - More stable today - D/c'd Coreg d/t low output HF   5) Acute on chronic CKD stage III   - baseline Cr 1.5-1.7  - 2.6 on admission. 2.1 today.  - suspect low output cardiorenal -  Continue '80mg'$  IV lasix  - Off SGLT2i - Daily BMET    Length of Stay: 2  Lyda Jester, PA-C  02/11/2022, 9:04 AM  Advanced Heart Failure Team Pager [phone] (M-F; 7a - 5p)  Please contact [facility] for night-coverage after hours (5p -7a ) and weekends on [email or web address]  Patient seen with PA, agree with the above note.    Excellent diuresis yesterday, CVP is about 10 today.  Still significant LE edema and above previous dry weight.  Co-ox 64.5% on milrinone 0.375, having some PVCs and rare short NSVT.    General: NAD Neck: JVP 8-9 cm, no thyromegaly or thyroid nodule.  Lungs: Clear to auscultation bilaterally with normal respiratory effort. CV: Nondisplaced PMI.  Heart regular S1/S2, no S3/S4, 2/6 SEM, 3/6 diastolic murmur along sternal border.  1+ edema into thighs.    Abdomen: Soft, nontender, no hepatosplenomegaly, no distention.  Skin: Intact without lesions or rashes.  Neurologic: Alert and oriented x 3.  Psych: Normal affect. Extremities: No clubbing or cyanosis.  HEENT: Normal.   Continue current milrinone 0.375, good co-ox.  Weight down significantly with good diuresis yesterday but still above previous dry weight.  CVP is 10 but he still has significant peripheral edema.  I will give him 1 more dose of Lasix 80 mg IV today.  Creatinine trending down with milrinone.    Long-term picture is concerning.  He was admitted with low output HF in setting of biventricular severe dysfunction and severe AI.   He needs workup at [facility] for transplant.  He is feeling much better and looks better.  I suspect that he is going to be inotrope dependent.  Will plan on home milrinone and evaluation asap at [facility] for transplant.  Ideally, would have Lifevest while awaiting his transplant evaluation.  His insurance has denied this.  If appeal is denied, would probably send home on amiodarone given PVCs and NSVT.   Possible discharge tomorrow.   Loralie Champagne. 02/11/2022 10:19 AM

## 2022-02-12 ENCOUNTER — Telehealth (HOSPITAL_COMMUNITY): Payer: Self-pay

## 2022-02-12 ENCOUNTER — Encounter (HOSPITAL_COMMUNITY): Payer: BC Managed Care – PPO

## 2022-02-12 ENCOUNTER — Other Ambulatory Visit (HOSPITAL_COMMUNITY): Payer: Self-pay

## 2022-02-12 DIAGNOSIS — I509 Heart failure, unspecified: Secondary | ICD-10-CM | POA: Diagnosis not present

## 2022-02-12 DIAGNOSIS — I5043 Acute on chronic combined systolic (congestive) and diastolic (congestive) heart failure: Secondary | ICD-10-CM | POA: Diagnosis not present

## 2022-02-12 DIAGNOSIS — K5900 Constipation, unspecified: Secondary | ICD-10-CM | POA: Diagnosis not present

## 2022-02-12 DIAGNOSIS — N1831 Chronic kidney disease, stage 3a: Secondary | ICD-10-CM | POA: Diagnosis not present

## 2022-02-12 DIAGNOSIS — I10 Essential (primary) hypertension: Secondary | ICD-10-CM | POA: Diagnosis not present

## 2022-02-12 LAB — COMPREHENSIVE METABOLIC PANEL
ALT: 16 U/L (ref 0–44)
AST: 19 U/L (ref 15–41)
Albumin: 2.9 g/dL — ABNORMAL LOW (ref 3.5–5.0)
Alkaline Phosphatase: 50 U/L (ref 38–126)
Anion gap: 9 (ref 5–15)
BUN: 21 mg/dL — ABNORMAL HIGH (ref 6–20)
CO2: 27 mmol/L (ref 22–32)
Calcium: 8.7 mg/dL — ABNORMAL LOW (ref 8.9–10.3)
Chloride: 99 mmol/L (ref 98–111)
Creatinine, Ser: 1.69 mg/dL — ABNORMAL HIGH (ref 0.61–1.24)
GFR, Estimated: 52 mL/min — ABNORMAL LOW (ref >60)
Glucose, Bld: 89 mg/dL (ref 70–99)
Potassium: 3.8 mmol/L (ref 3.5–5.1)
Sodium: 135 mmol/L (ref 135–145)
Total Bilirubin: 1.6 mg/dL — ABNORMAL HIGH (ref 0.3–1.2)
Total Protein: 5.3 g/dL — ABNORMAL LOW (ref 6.5–8.1)

## 2022-02-12 LAB — COOXEMETRY PANEL
Carboxyhemoglobin: 2.3 % — ABNORMAL HIGH (ref 0.5–1.5)
Carboxyhemoglobin: 1.8 % — ABNORMAL HIGH (ref 0.5–1.5)
Methemoglobin: <0.7 % (ref 0.0–1.5)
Methemoglobin: <0.7 % (ref 0.0–1.5)
O2 Saturation: 71.9 %
O2 Saturation: 61.7 %
Total hemoglobin: 12.6 g/dL (ref 12.0–16.0)
Total hemoglobin: 12.6 g/dL (ref 12.0–16.0)

## 2022-02-12 LAB — CBC
HCT: 35.9 % — ABNORMAL LOW (ref 39.0–52.0)
Hemoglobin: 12.3 g/dL — ABNORMAL LOW (ref 13.0–17.0)
MCH: 28 pg (ref 26.0–34.0)
MCHC: 34.3 g/dL (ref 30.0–36.0)
MCV: 81.6 fL (ref 80.0–100.0)
Platelets: 188 10*3/uL (ref 150–400)
RBC: 4.4 MIL/uL (ref 4.22–5.81)
RDW: 16.9 % — ABNORMAL HIGH (ref 11.5–15.5)
WBC: 4.7 10*3/uL (ref 4.0–10.5)
nRBC: 0 % (ref 0.0–0.2)

## 2022-02-12 LAB — MAGNESIUM: Magnesium: 2 mg/dL (ref 1.7–2.4)

## 2022-02-12 MED ORDER — AMIODARONE HCL 200 MG PO TABS
ORAL_TABLET | ORAL | 0 refills | Status: DC
  Filled 2022-02-12: qty 40, 30d supply, fill #0

## 2022-02-12 MED ORDER — MILRINONE LACTATE IN DEXTROSE 20-5 MG/100ML-% IV SOLN: 0.3750 ug/kg/min | INTRAVENOUS | 0 refills | Status: DC

## 2022-02-12 MED ORDER — TORSEMIDE 20 MG PO TABS
60.0000 mg | ORAL_TABLET | Freq: Every day | ORAL | 1 refills | Status: DC
  Filled 2022-02-12: qty 90, 30d supply, fill #0

## 2022-02-12 MED ORDER — TORSEMIDE 20 MG PO TABS
60.0000 mg | ORAL_TABLET | Freq: Every day | ORAL | Status: DC
  Administered 2022-02-12: 60 mg via ORAL
  Filled 2022-02-12: qty 3

## 2022-02-12 MED ORDER — ACETAMINOPHEN 500 MG PO TABS: 1000.0000 mg | ORAL_TABLET | Freq: Four times a day (QID) | ORAL | 0 refills | Status: DC | PRN

## 2022-02-12 MED ORDER — MILRINONE LACTATE IN DEXTROSE 20-5 MG/100ML-% IV SOLN: 0.2500 ug/kg/min | INTRAVENOUS | Status: DC

## 2022-02-12 MED ORDER — POLYETHYLENE GLYCOL 3350 17 GM/SCOOP PO POWD
17.0000 g | Freq: Every day | ORAL | 0 refills | Status: DC
  Filled 2022-02-12: qty 238, 14d supply, fill #0

## 2022-02-12 MED ORDER — ACETAMINOPHEN 500 MG PO TABS
1000.0000 mg | ORAL_TABLET | Freq: Four times a day (QID) | ORAL | Status: DC | PRN
Start: 2022-02-12 — End: 2022-02-12

## 2022-02-12 MED ORDER — SENNOSIDES-DOCUSATE SODIUM 8.6-50 MG PO TABS
1.0000 | ORAL_TABLET | Freq: Every day | ORAL | 0 refills | Status: DC
  Filled 2022-02-12: qty 30, 30d supply, fill #0

## 2022-02-12 MED ORDER — MILRINONE LACTATE IN DEXTROSE 20-5 MG/100ML-% IV SOLN: 0.3750 ug/kg/min | INTRAVENOUS | Status: DC

## 2022-02-12 MED ORDER — AMIODARONE HCL 200 MG PO TABS
200.0000 mg | ORAL_TABLET | Freq: Two times a day (BID) | ORAL | Status: DC
  Administered 2022-02-12: 200 mg via ORAL
  Filled 2022-02-12: qty 1

## 2022-02-12 NOTE — Discharge Summary (Addendum)
[facility] Discharge Summary  Patient name: [name] [name] Medical record number: [id number] Date of birth: 21-Apr-1981 Age: 41 y.o. Gender: male Date of Admission: 02/08/2022  Date of Discharge: 02/12/22 Admitting Physician: Kinnie Feil, MD  Primary Care Provider: Sharilyn Sites, MD Consultants: Cardiology  Indication for Hospitalization: CHF exacerbation  Brief Hospital Course:  [name] [name] is a 41 y.o. male who presented with abdominal pain, SOB, and leg swelling and found to have CHF exacerbation. PMH includes CHF s/p aortic valvuloplasty for insufficiency, CKD3, HTN, mitral regurgitation, and OSA.  His hospital course is listed below by problem set, for additional information refer to the H&P.   CHF exacerbation ([place]) Presented with abd pain and distention, SOB, 3+ pitting edema to LLE. VSS. Had been taking furoscix SQ lasix 80 mg daily as scheduled. Updated echo before admission in 01/2022 showed decreased EF to 20-25%. He was given IV lasix 80 mg BID with metolazone x1. Cards consulted with recs to consider transplant in future. Started on Milrinone drip due to low co-ox. Coreg dc'd due to low cardiac output. Was noted to have PVCs and NSVT on telemetry. He was ultimately discharged on milrinone drip to be continued outpatient. Since his lifevest was denied by insurance, and the appeal was still in process at the time of d/c, he was discharged with Amiodarone (see instructions below). RHC was deferred this admission given his workup for heart transplantation at [facility] will include this. His weight at the time of d/c was 209 lbs which is close to his d/c weight from previous hospitalization and thought to be dry weight. During hospitalization he had a net UOP of 14.162 L, weight decreased 22 lbs (231 lbs on admit).   Sent home with: Amiodarone 200 mg BID x 10 days then 200 mg daily Potassium chloride 4mq daily Milrinone .375 mcg/kg/min continuous  infusion Torsemide '60mg'$  daily   AKI on CKD3 Cr elevated to 2.6 (baseline around 1.6) on admission, likely d/t cardiorenal syndrome. Cr improved to 1.69 by discharge.   Constipation Reported little BM before admission. Given miralax, senna. Symptoms improved by discharge.   Chronic and stable problems: -Anxiety: continued home xanax 0.5 mg qhs -HTN: BP stable over admission with diuresis and decreased coreg -R Inguinal hernia: nonpainful on admission, was going to follow up with surgery though did not given recent birth of twins  Issues for follow up: Per cardiology, pt needs follow up with [facility] for transplant eval and RHC.  F/u lifevest insurance appeal Entresto held during hospital stay due to AKI. Consider restarting as needed Ensure PICC hygiene and care instructions for milrinone  Cardiogenic cirrhosis found on CT study. LFTs within normal limits. Recommend PCP to follow up.  Consider surgical referral for fat-containing inguinal hernia A1c 6.6 while admitted, just barely within diabetic range. Known history of pre-diabetes. Recommend PCP to recheck when clinically improved and treat accordingly. Consider starting SGLT-2 given history of CHF. Recheck BMP for Cr. Baseline appears to be 1.6-1.8.    Discharge Diagnoses/Problem List:  Principal Problem:   CHF exacerbation ([place] Active Problems:   AKI on CKD3   Constipation   Acute on chronic combined systolic and diastolic CHF (congestive heart failure) (HCC)   Heart failure with acute decompensation, type unknown ([place]   Cardiogenic shock ([place]   Diabetes mellitus with complication ([place]  Disposition: home  Discharge Condition: improved, stable  Discharge Exam:  Blood pressure 116/75, pulse 91, temperature 98.5 F (36.9 C), temperature source  Oral, resp. rate 20, height '5\' 10"'$  (1.778 m), weight 95.1 kg, SpO2 98 %. General: Sitting comfortably in bed, NAD Cardiovascular: RRR, 3/6 diastolic murmur and 2/6 systolic murmur  best appreciated over RUSB Respiratory: CTAB, no wheezes or rales Abdomen: Soft, non-distended, mildly tender in LLQ, improved from yesterday, no guarding or rebound tenderness Extremities: 1+ BLE pitting edema, improved from yesterday  Significant Procedures: PICC line placed  Significant Labs and Imaging:  Recent Labs  Lab 02/11/22 0432 02/12/22 0536  WBC 5.2 4.7  HGB 12.5* 12.3*  HCT 37.4* 35.9*  PLT 211 188   Recent Labs  Lab 02/11/22 0432 02/12/22 0536  NA 135 135  K 3.7 3.8  CL 100 99  CO2 27 27  GLUCOSE 90 89  BUN 31* 21*  CREATININE 2.06* 1.69*  CALCIUM 8.6* 8.7*  MG 1.9 2.0  ALKPHOS 58 50  AST 18 19  ALT 16 16  ALBUMIN 2.9* 2.9*   Co-oxemetry panel: O2 sat 71.9 Carboxyhemoglobin 2.3  Results/Tests Pending at Time of Discharge: n/a  Discharge Medications:  Allergies as of 02/12/2022   No Known Allergies      Medication List     STOP taking these medications    carvedilol 25 MG tablet Commonly known as: COREG   Entresto 97-103 MG Generic drug: sacubitril-valsartan   Furoscix 80 MG/10ML Ctkt Generic drug: Furosemide   furosemide 80 MG tablet Commonly known as: LASIX       TAKE these medications    acetaminophen 500 MG tablet Commonly known as: TYLENOL Take 2 tablets (1,000 mg total) by mouth every 6 (six) hours as needed for moderate pain.   albuterol 108 (90 Base) MCG/ACT inhaler Commonly known as: VENTOLIN HFA Inhale 2 puffs into the lungs every 6 (six) hours as needed for wheezing or shortness of breath.   allopurinol 300 MG tablet Commonly known as: ZYLOPRIM Take 1 tablet (300 mg total) by mouth daily.   ALPRAZolam 0.5 MG tablet Commonly known as: XANAX Take 0.5 mg by mouth at bedtime.   amiodarone 200 MG tablet Commonly known as: PACERONE Take 1 tablet (200 mg total) by mouth 2 (two) times daily for 10 days, THEN 1 tablet (200 mg total) daily. Start taking on: February 12, 2022   milrinone 20 MG/100 ML Soln  infusion Commonly known as: PRIMACOR Inject 0.0357 mg/min into the vein continuous.   polyethylene glycol powder 17 GM/SCOOP powder Commonly known as: GLYCOLAX/MIRALAX Take 17 g by mouth daily. Start taking on: February 13, 2022   potassium chloride SA 20 MEQ tablet Commonly known as: KLOR-CON M Take 20 mEq by mouth daily.   Senexon-S 8.6-50 MG tablet Generic drug: senna-docusate Take 1 tablet by mouth at bedtime.   torsemide 20 MG tablet Commonly known as: DEMADEX Take 3 tablets (60 mg total) by mouth daily. Start taking on: February 13, 2022               Durable Medical Equipment  (From admission, onward)           Start     Ordered   02/10/22 1131  For home use only DME Vest life vest  Once       Comments: ZOLL Life Vest   Identify indication. Type yes or no  Cardiac Arrest due to VF or sustained VT: No Familial or inherited condition with SCA risk: No MI with an EF < 35% or DCM with an EF < 35%: Yes ICD Explanation: plan for heart transplant Other  condition with high risk of VT/VF: Heart failure, aortic insufficiency  Life Vest Setting  VT 150 bpm VF 200 BPM [id number] Length of need: 3 months  Start Date: 02/11/22   02/10/22 1133           Discharge Instructions: Please refer to Patient Instructions section of EMR for full details.  Patient was counseled important signs and symptoms that should prompt return to medical care, changes in medications, dietary instructions, activity restrictions, and follow up appointments.   Follow-Up Appointments:  Follow-up Information     Bensimhon, Shaune Pascal, MD. Go on 02/19/2022.   Specialty: Cardiology Why: Please go to your scheduled appointment at 9:30am Contact information: [place] [address] [phone]         Jolaine Artist, MD Follow up on 03/09/2022.   Specialty: Cardiology Why: Please go to your scheduled appointment at 11:40am Contact information: [address] [place] [address] [phone]         Sharilyn Sites, MD. Go on 02/17/2022.   Specialty: Family Medicine Why: '@8'$ :30am Contact information: [address] [place] [address] [phone]         [facility] Follow up.   Why: will provide Milrinone Contact information: [place] Follow up.   Why: Home Health RN Contact information: [place], Atif, MD 02/12/2022, 11:36 AM PGY-1, [facility] Upper-Level Resident Addendum   I have independently interviewed and examined the patient. I have discussed the above with the original author and agree with their documentation. My edits for correction/addition/clarification are included where appropriate. Please see also any attending notes.   Sharion Settler, DO PGY-3, [facility] 02/12/2022 3:49 PM  [facility] pager: [phone] (text pages welcome through [facility])

## 2022-02-12 NOTE — Progress Notes (Addendum)
Advanced Heart Failure Rounding Note  PCP-Cardiologist: Glori Bickers, MD   Subjective:    On Milrinone 0.375. Co-ox 72%.   Got 1 dose of IV Lasix yesterday w/ 5.3L in UOP. Wt down 7 lb. CVP 8. Just about at his discharge weight in June now.   SCr continues to trend down, 2.33>>2.30>>2.06>>1.69 today   Sitting on EOB, denies CP and SOB.    Objective:   Weight Range: 95.1 kg Body mass index is 30.08 kg/m.   Vital Signs:   Temp:  [97.9 F (36.6 C)-98.4 F (36.9 C)] 98 F (36.7 C) (08/03 0535) Pulse Rate:  [88-94] 91 (08/03 0035) Resp:  [18-20] 20 (08/03 0535) BP: (101-117)/(63-77) 117/72 (08/03 0535) SpO2:  [95 %-99 %] 98 % (08/03 0535) Weight:  [95.1 kg] 95.1 kg (08/03 0237) Last BM Date : 03/11/22  Weight change: Filed Weights   02/10/22 0459 02/11/22 0410 02/12/22 0237  Weight: 101.5 kg 98.2 kg 95.1 kg    Intake/Output:   Intake/Output Summary (Last 24 hours) at 02/12/2022 0802 Last data filed at 02/12/2022 0200 Gross per 24 hour  Intake 1080 ml  Output 4500 ml  Net -3420 ml     Physical Exam    CVP 8  General:  well appearing. No respiratory difficulty HEENT: normal Neck: supple. JVD 8. Carotids 2+ bilat; no bruits. No lymphadenopathy or thyromegaly appreciated. Cor: PMI nondisplaced. Regular rate & rhythm. No rubs, or gallops. Diastolic murmur.  Lungs: clear, diminished at the bases Abdomen: soft, nontender, nondistended. No hepatosplenomegaly. No bruits or masses. Good bowel sounds. Extremities: no cyanosis, clubbing, rash, +1 BLE edema Neuro: alert & oriented x 3, cranial nerves grossly intact. moves all 4 extremities w/o difficulty. Affect pleasant.   Telemetry   NSR 100-105, PVC's ~1-2/hr (Personally reviewed)    EKG    No new EKG to review  Labs    CBC Recent Labs    02/11/22 0432 02/12/22 0536  WBC 5.2 4.7  HGB 12.5* 12.3*  HCT 37.4* 35.9*  MCV 81.5 81.6  PLT 211 833   Basic Metabolic Panel Recent Labs    02/11/22 0432  02/12/22 0536  NA 135 135  K 3.7 3.8  CL 100 99  CO2 27 27  GLUCOSE 90 89  BUN 31* 21*  CREATININE 2.06* 1.69*  CALCIUM 8.6* 8.7*  MG 1.9 2.0   Liver Function Tests Recent Labs    02/11/22 0432 02/12/22 0536  AST 18 19  ALT 16 16  ALKPHOS 58 50  BILITOT 1.7* 1.6*  PROT 5.4* 5.3*  ALBUMIN 2.9* 2.9*   No results for input(s): "LIPASE", "AMYLASE" in the last 72 hours.  Cardiac Enzymes No results for input(s): "CKTOTAL", "CKMB", "CKMBINDEX", "TROPONINI" in the last 72 hours.  BNP: BNP (last 3 results) Recent Labs    12/22/21 1908 01/05/22 1605 02/08/22 0822  BNP 3,079.0* 3,506.9* 3,036.5*    ProBNP (last 3 results) No results for input(s): "PROBNP" in the last 8760 hours.   D-Dimer No results for input(s): "DDIMER" in the last 72 hours. Hemoglobin A1C No results for input(s): "HGBA1C" in the last 72 hours.  Fasting Lipid Panel No results for input(s): "CHOL", "HDL", "LDLCALC", "TRIG", "CHOLHDL", "LDLDIRECT" in the last 72 hours.  Thyroid Function Tests No results for input(s): "TSH", "T4TOTAL", "T3FREE", "THYROIDAB" in the last 72 hours.  Invalid input(s): "FREET3"   Other results:   Imaging    No results found.   Medications:     Scheduled Medications:  allopurinol  300 mg Oral Daily   ALPRAZolam  0.5 mg Oral QHS   Chlorhexidine Gluconate Cloth  6 each Topical Daily   enoxaparin (LOVENOX) injection  40 mg Subcutaneous Q24H   pneumococcal 20-valent conjugate vaccine  0.5 mL Intramuscular Tomorrow-1000   polyethylene glycol  17 g Oral Daily   potassium chloride SA  20 mEq Oral Daily   senna-docusate  1 tablet Oral QHS   sodium chloride flush  10-40 mL Intracatheter Q12H    Infusions:  milrinone 0.375 mcg/kg/min (02/11/22 2259)    PRN Medications: albuterol, sodium chloride flush    Patient Profile   Mr. Mutchler is a 41 y.o.with systolic HF due to  NICM, hypertension and CKD stage II-III (baseline Cr 1.5-1.7) here for acute on  chronic systolic heart failure.  Assessment/Plan   1) Chronic systolic HF: NICM,   - Echo ~20% in 2013. EF 45-50% in 2016. Suspect due to ongoing AI +/- HTN - s/p AoV repair 10/18. F/u echo 12/18 with EF 45-50% moderate AI - Echo (2019): EF 30-35% Mod AI.  - Echo (8/20): EF 45% (out of range for ICD) - Echo (3/21): EF 45% moderate AI. LV dimensions stable (LVIDd 6.5cm) - Echo (10/22): EF 30-35% LV dilated. Mild-mod AI Mod-severe central MR (LVIDd 6.0cm)  - Echo (6/23): EF 20-25%, severe biventricular dysfunction, mild to moderate MR, severe AI (V max 2.9 m/s, MG 16 mmHG.) - TEE 01/20/22: LVEF 20-25%, LV mildly dilated, RV function severely reduced, size is normal. LA severely dilated. RA moderately dilated. Mod MR. Mod TR. Aortic valve has been replaced, regurgitation is severe. Pulmonic valve regurg mod.  - On admission presented with NYHA IIIb. BNP 3036, HsTrop 48, CXR: New mild bilateral interstitial edema or infiltrates, Stable cardiomegaly. CT Signs of anasarca with small R effusion, pulmonary edema,ascites and body wall edema.  - Discharged 12/25/21 weighting 208lbs>>at 231lbs this admission. - Started on Milrinone 0.375 for low co-ox (26%). Co-ox improved 72% today.  - Volume much improved. CVP 8.  - Got Lasix x1 yesterday -4.5L UOP, down 7lbs overnight - Start on torsemide '60mg'$  daily - D/c'd coreg in setting of low output HF - Hold Entresto 97/103 mg bid in setting of AKI - Hydralazine stopped with low BP (did not tolerate Imdur with headaches). - Willing to retry hydralizine/nitrates if afterload reduction needed.  - Has not been on spiro due to hyperkalemia. - Did not tolerate Wilder Glade (says it made his legs swell). - Continue UNNA boots - Will likely need transplant eval for recurrent AI and severe cardiomyopathy. Refer to [facility] for transplant w/u, paperwork started - Plan home milrinone for bridge to transplant - LifeVest at d/c ordered, insurance denied, appeal can take days.  Will need amiodarone '200mg'$  BID at d/c for PVC's / NSVT   2) S/P AV repair 05/01/2017 - LV remains down and severely dilated on last echo (6/23).  - 01/20/22 TEE shows Aortic valve has been replaced, regurgitation is severe. - Plan referral to [facility] for transplant   3) Mitral regurgitation - functional. Moderate to severe MR on echo 6/23. - MV peak gradient 9.1 mmHg, mean mitral valve gradient 2.0 mmHg. - TEE 6/23 mild/mod   4) HTN   - More stable today - D/c'd Coreg d/t low output HF   5) Acute on chronic CKD stage III   - baseline Cr 1.5-1.7  - 2.6 on admission. 1.69 today.  - suspect low output cardiorenal - Off SGLT2i - Daily BMET   [place]  to go home per AHF team. Carolynn Sayers to provide home d/c education for home milrinone infusion.   HF meds at d/c:  Amiodarone '200mg'$  BID Potassium chloride 60mq daily Milrinone .375 mcg/kg/min continuous infusion Torsemide '60mg'$  daily  Length of Stay: 3[place] AGACNP-BC  02/12/2022, 8:02 AM  Advanced Heart Failure Team Pager [phone](M-F; 7a - 5p)  Please contact [facility] for night-coverage after hours (5p -7a ) and weekends on [email or web address]  Patient seen with NP, agree with the above note.   Good diuresis yesterday, weight down another 7 lbs. Breathing is much improved since admission.  Co-ox 72% on milrinone 0.375, CVP 8, creatinine down to 1.69.   General: NAD Neck: JVP 8 cm, no thyromegaly or thyroid nodule.  Lungs: Clear to auscultation bilaterally with normal respiratory effort. CV: Lateral PMI.  Heart regular S1/S2, no S3/S4, 2/6 SEM RUSB, 3/6 diastolic murmur along the sternal border.  Trace edema.  Abdomen: Soft, nontender, no hepatosplenomegaly, no distention.  Skin: Intact without lesions or rashes.  Neurologic: Alert and oriented x 3.  Psych: Normal affect. Extremities: No clubbing or cyanosis.  HEENT: Normal.   Continue current milrinone 0.375, good co-ox.  Weight down significantly again with good diuresis  yesterday.  CVP now 8, will transition to torsemide 60 mg daily.    Long-term picture is concerning.  He was admitted with low output HF in setting of biventricular severe dysfunction and severe AI.  [facility]  He is feeling much better and looks better.  I suspect that he is going to be inotrope dependent.  [facility] plan [facility].  Ideally, would have Lifevest while awaiting his transplant evaluation.  However, his insurance has denied this.  I will send him home on amiodarone given PVCs and NSVT.   [place] [place] [place] [facility] [place].  Needs close CHF clinic followup.  [facility]  Meds for home: milrinone 0.375, amiodarone 200 mg bid x 10 days then 200 mg daily, torsemide 60 daily, KCl 20 daily.   DLoralie Champagne8/09/2021

## 2022-02-12 NOTE — TOC CM/SW Note (Signed)
Received notification from [facility] ref # [id number] that pt was denied for Life Vest. HF MD made aware. Will not pursue appeal. Updated [employer] rep. Pt does not qualify for [facility] letter of guarantee, due to pt has commercial insurance. Contacted [facility] rep, Pam and she has orders for Home IV Milrinone. Scheduled teaching on 02/12/2022, prior to dc.    [place], Heart Failure TOC CM [phone]

## 2022-02-12 NOTE — Discharge Instructions (Addendum)
Dear [name] [name],  Thank you for letting us participate in your care. You were admitted to the inpatient Family Medicine Teaching Service at [facility] due to worsening of your congestive heart failure (CHF). You were seen by our Cardiology team who recommended you follow up with their CHF clinic, as well as with [facility] to evaluate you for a heart transplant.   While in the hospital, you were given IV Lasix to help remove fluid from your body. You were also started on a medicine called Milrinone which helps your heart pump better. The Cardiology team placed an appeal to get a Lifevest but this has not been processed yet.  Please review your attached medication list, as changes have been made. Please follow up with your the CHF clinic and [facility].  Please return to the hospital if you have chest pain, trouble breathing, or severe abdominal pain or vomiting. Please call your doctor if you notice drainage, redness, or swelling around your PICC line.  POST-HOSPITAL & CARE INSTRUCTIONS Please review your medication changes Go to your follow up appointments (listed below)   DOCTOR'S APPOINTMENT   Future Appointments  Date Time Provider [place]  02/19/2022  9:30 AM [facility] PA/NP [facility] None  03/09/2022 11:40 AM Bensimhon, Shaune Pascal, MD [facility] None    Follow-up Information     Bensimhon, Shaune Pascal, MD. Go on 02/19/2022.   Specialty: Cardiology Why: Please go to your scheduled appointment at 9:30am Contact information: [place] [address] [phone]         Jolaine Artist, MD Follow up on 03/09/2022.   Specialty: Cardiology Why: Please go to your scheduled appointment at 11:40am Contact information: [place] [address] [phone]                 Take care and be well!  [facility]  [place], [place] [address] [phone]

## 2022-02-12 NOTE — TOC Initial Note (Addendum)
Transition of Care [facility]) - Initial/Assessment Note    Patient Details  Name: [name] [name] MRN: [id number] Date of Birth: Jul 10, 1981  [facility] CM/SW Contact:    Erenest Rasher, RN Phone Number: [phone] 02/12/2022, 10:58 AM  Clinical Narrative:                 HF TOC CM contacted [facility] and [facility] for scheduled dc home with Home IV Milrinone. Will request meds up from [place]. Wife will provide transportation home. Provide pt with appt card with code.   Expected Discharge Plan: Home/Self Care Barriers to Discharge: No Barriers Identified   Patient Goals and CMS Choice  Expected Discharge Plan and Services Expected Discharge Plan: Home/Self Care   Discharge Planning Services: CM Consult   Living arrangements for the past 2 months: Single Family Home  HH Arranged: RN [place] Agency: [place] ([facility]), [facility] Date [place]: 02/12/22 Time [place]: [place] Representative spoke with at [place]: Caryl Pina ([facility]), Pam [place])  Prior Living Arrangements/Services Living arrangements for the past 2 months: Single Family Home   Patient language and need for interpreter reviewed:: Yes Do you feel safe going back to the place where you live?: Yes      Need for Family Participation in Patient Care: No (Comment) Care giver support system in place?: No (comment)   Criminal Activity/Legal Involvement Pertinent to Current Situation/Hospitalization: No - Comment as needed  Activities of Daily Living Home Assistive Devices/Equipment: Scales, Other (Comment) (medicine pump) ADL Screening (condition at time of admission) Patient's cognitive ability adequate to safely complete daily activities?: Yes Is the patient deaf or have difficulty hearing?: No Does the patient have difficulty seeing, even when wearing glasses/contacts?: No Does the patient have difficulty concentrating, remembering, or making decisions?:  No Patient able to express need for assistance with ADLs?: Yes Does the patient have difficulty dressing or bathing?: No Independently performs ADLs?: Yes (appropriate for developmental age) Does the patient have difficulty walking or climbing stairs?: No Weakness of Legs: None Weakness of Arms/Hands: None  Permission Sought/Granted Permission sought to share information with : Case Manager, Family Supports, PCP Permission granted to share information with : Yes, Verbal Permission Granted  Share Information with NAME: Yuji Walth     Permission granted to share info w Relationship: wife  Permission granted to share info w Contact Information: [phone]  Emotional Assessment Appearance:: Appears stated age Attitude/Demeanor/Rapport: Engaged Affect (typically observed): Accepting Orientation: : Oriented to Self, Oriented to Place, Oriented to  Time, Oriented to Situation   Psych Involvement: No (comment)  Admission diagnosis:  Acute right-sided congestive heart failure ([place]) [I50.811] CHF exacerbation (HCC) [I50.9] Heart failure with acute decompensation, type unknown ([place]) [I50.9] Patient Active Problem List   Diagnosis Date Noted   Diabetes mellitus with complication ([place]) 95/63/8756   Cardiogenic shock ([place])    Heart failure with acute decompensation, type unknown ([place]) 02/09/2022   CHF exacerbation ([place]) 02/08/2022   Inguinal hernia 02/08/2022   Constipation 02/08/2022   Anasarca    Acute heart failure reduced EF  12/23/2021   S/P aortic valve repair 05/11/2017   Aortic valve regurgitation    Chronic systolic CHF (congestive heart failure) ([place])    Hypertensive emergency 05/30/2015   Essential hypertension 11/22/2014   Shift work sleep disorder 10/30/2014   Obstructive sleep apnea 10/30/2014   Hypertensive crisis 09/26/2014   Hypertensive urgency 06/13/2013   CHF (congestive heart failure) ([place]) 06/13/2013  AKI on CKD3 06/13/2013   Nonischemic  cardiomyopathy ([place]) 06/13/2013   HTN (hypertension) 02/25/2012   Chronic combined systolic and diastolic heart failure ([place]) 01/08/2012   Elevated troponin 01/01/2012   Acute on chronic combined systolic and diastolic CHF (congestive heart failure) ([place]) 12/31/2011   Hypokalemia 12/31/2011   Chest pain 12/31/2011   AKI (acute kidney injury) ([place]) 12/31/2011   PCP:  Sharilyn Sites, MD Pharmacy:   [place], [place] - [address] [place] [id number] [place] [address] Phone: [phone] Fax: [phone]  [facility] [address] [place] [address] Phone: [phone] Fax: [phone]     Social Determinants of Health (SDOH) Interventions    Readmission Risk Interventions     No data to display

## 2022-02-12 NOTE — Telephone Encounter (Signed)
Forms faxed to [employer] on 02/11/22 at 2pm

## 2022-02-12 NOTE — Telephone Encounter (Signed)
Referral faxed to [facility] on 02/12/2022 at 2.30pm

## 2022-02-13 ENCOUNTER — Telehealth: Payer: Self-pay | Admitting: *Deleted

## 2022-02-13 ENCOUNTER — Encounter (HOSPITAL_COMMUNITY): Payer: BC Managed Care – PPO

## 2022-02-13 DIAGNOSIS — I509 Heart failure, unspecified: Secondary | ICD-10-CM | POA: Diagnosis not present

## 2022-02-13 NOTE — Chronic Care Management (AMB) (Signed)
  Care Coordination   Note   02/13/2022 Name: WYLIE COON MRN: [id number] DOB: 1980-10-24  KAZUO DURNIL is a 41 y.o. year old male who sees Sharilyn Sites, MD for primary care. I reached out to [name] by phone today to offer care coordination services. Referral received   Mr. Mohabir was given information about Care Coordination services today including:   The Care Coordination services include support from the care team which includes your Nurse Coordinator, Clinical Social Worker, or Pharmacist.  The Care Coordination team is here to help remove barriers to the health concerns and goals most important to you. Care Coordination services are voluntary, and the patient may decline or stop services at any time by request to their care team member.   Care Coordination Consent Status: Patient agreed to services and verbal consent obtained.   Follow up plan:  Telephone appointment with care coordination team member scheduled for:  02/20/2022  Encounter Outcome:  Pt. Scheduled  Julian Hy, [place] Direct Dial: [phone]

## 2022-02-14 DIAGNOSIS — I509 Heart failure, unspecified: Secondary | ICD-10-CM | POA: Diagnosis not present

## 2022-02-15 DIAGNOSIS — I509 Heart failure, unspecified: Secondary | ICD-10-CM | POA: Diagnosis not present

## 2022-02-16 DIAGNOSIS — I509 Heart failure, unspecified: Secondary | ICD-10-CM | POA: Diagnosis not present

## 2022-02-17 DIAGNOSIS — I5042 Chronic combined systolic (congestive) and diastolic (congestive) heart failure: Secondary | ICD-10-CM | POA: Diagnosis not present

## 2022-02-17 DIAGNOSIS — I509 Heart failure, unspecified: Secondary | ICD-10-CM | POA: Diagnosis not present

## 2022-02-17 DIAGNOSIS — Z6831 Body mass index (BMI) 31.0-31.9, adult: Secondary | ICD-10-CM | POA: Diagnosis not present

## 2022-02-17 DIAGNOSIS — E6609 Other obesity due to excess calories: Secondary | ICD-10-CM | POA: Diagnosis not present

## 2022-02-17 DIAGNOSIS — I420 Dilated cardiomyopathy: Secondary | ICD-10-CM | POA: Diagnosis not present

## 2022-02-18 ENCOUNTER — Telehealth (HOSPITAL_COMMUNITY): Payer: Self-pay

## 2022-02-18 DIAGNOSIS — I509 Heart failure, unspecified: Secondary | ICD-10-CM | POA: Diagnosis not present

## 2022-02-18 NOTE — Progress Notes (Signed)
Advanced Heart Failure Clinic Note   Patient ID: [name] [name], male   DOB: 04/25/1981, 41 y.o.   MRN: [id number]  Primary PCP: [name]  HF Cardiologist: Dr. Haroldine [name]  Reason for Visit: post hospital HF follow up  HPI: [name] [name] is a 41 y.o.with systolic HF due to  NICM, hypertension and CKD stage II-III (baseline Cr 1.5-1.7).   He was first diagnosed with HF in 2012 and his EF recovered and was instructed to stop taking carvedilol, spironolactone, and lasix by previous cardiologist. Admitted to [facility] 6/43/32 for acute systolic heart failure. 12/31/11 ProBNP 3777. HIV nonreactive. Thyroid panel normal. Renal ultrasound was negative for obstruction. EF 20%. Discharge weight 193 lbs. Cath with normal coronaries.  Admitted 05/30/2015 with HTN urgency with troponin elevation likely consistent with myocardial strain/demand ischemia in the setting of medication non compliance, having run out 2-3 weeks ago and not refilling them. Placed back on his coreg, hydralazine, and spiro and was symptomatically stable on discharge. No ACE/ARB with CKD and does not tolerate nitrates with headaches. Discharge weight 193 lbs.  Underwent TEE in 9/18 with severe AI. EF 40-45%. Cath with normal coronaries. S/P AV repair 05/11/2017 with Dr. Roxy [name].  PYP scan negative 10/18  Echo 02/13/19. EF 45% w/ moderate AI. EF out of range for ICD. Echo 3/21 EF 45% moderate AI. LV dimensions stable.   Seen in clinic 09/14/19 and complained of intermittent palpitations. Echo stable  Zio patch NSR w/ 1 brief run of NSVT (4 beats), 4 brief runs of SVT and rare PVCs < 1% burden.   ED visit 10/02/20 with volume overload. Reds Clip 42%. Instructed to increase lasix to 80 mg daily however the medication script remained 40 mg daily. POC US showed EF 35%.   Echo 10/22 EF 30-35% LV dilated. Mild-mod AI Mod-severe central MR.   Follow up 4/23, stable NYHA II. Plan to repeat echo to evaluate AI and MR.  Admitted 6/23 with  chest pain, found to be in a/c CHF. Diuresed with IV lasix. Wilder Glade stopped at patient's request. Echo showed EF down to 20-25%, severe AI.  Admitted 02/09/22 with A/C HFrEF/low output. Diuresed with IV lasix and started on milrinone.Will likely need transplant eval for recurrent AI and severe cardiomyopathy. Refer to [name] for transplant w/u, paperwork started  Today he returns for HF follow up.Overall feeling fine. Denies SOB/PND/Orthopnea. Appetite ok. No fever or chills. Weight at home  pounds. Taking all medications. Followed by [facility].   Cardiac Studies: - Echo (2/14): EF 20-25% - Echo (3/16): EF 40%  - Echo (11/16): EF 20-25% - Echo (11/17): EF 45-50% - Echo (9/18): EF ~45% Severe AI  - Echo (12/18): EF 45-50% . Grade II DD - Echo (2019): EF 30-35%  Moderate aortic regurgitation. RV moderately reduced.  - Echo (8/20): EF 45%, moderate AI - Echo (3/21): EF 45%, Grade II DD, moderate AI  - POC [place] (3/22): EF 35% - Echo (10/22): EF 30-35%, LV dilated, mild-mod AI, moderate-severe central MR. - Echo (6/23): EF 20-25%, severe biventricular dysfunction, mild to moderate MR, severe AI (V max  2.9 m/s, MG 16 mmHG.)   Review of systems complete and found to be negative unless listed in HPI.   Past Medical History:  Diagnosis Date   Anxiety    CHF (congestive heart failure) (HCC)    Chronic systolic heart failure (HCC)    CKD (chronic kidney disease) stage 2, GFR 60-89 ml/min    Dyspnea    with value  issues- "if I dont take my medication"   Essential hypertension, benign    Headache    History of pneumonia    Mitral regurgitation    Moderate   Noncompliance    Nonischemic cardiomyopathy ([place])    Normal coronaries May 2012, LVEF < 20%   Pneumonia 2011   S/P aortic valve repair 05/11/2017   Complex valvuloplasty including plication of left coronary leaflet and 54m Biostable HAART ring annuloplasty   Current Outpatient Medications  Medication Sig Dispense Refill   acetaminophen  (TYLENOL) 500 MG tablet Take 2 tablets (1,000 mg total) by mouth every 6 (six) hours as needed for moderate pain. 30 tablet 0   albuterol (VENTOLIN HFA) 108 (90 Base) MCG/ACT inhaler Inhale 2 puffs into the lungs every 6 (six) hours as needed for wheezing or shortness of breath. 18 g 2   allopurinol (ZYLOPRIM) 300 MG tablet Take 1 tablet (300 mg total) by mouth daily. 30 tablet 5   ALPRAZolam (XANAX) 0.5 MG tablet Take 0.5 mg by mouth at bedtime.     amiodarone (PACERONE) 200 MG tablet Take 1 tablet (200 mg total) by mouth 2 (two) times daily for 10 days, THEN 1 tablet (200 mg total) daily. 50 tablet 0   milrinone (PRIMACOR) 20 MG/100 ML SOLN infusion Inject 0.0357 mg/min into the vein continuous. 100 mL 0   polyethylene glycol powder (GLYCOLAX/MIRALAX) 17 GM/SCOOP powder Take 17 g by mouth daily. 238 g 0   potassium chloride SA (KLOR-CON M) 20 MEQ tablet Take 20 mEq by mouth daily.     senna-docusate (SENOKOT-S) 8.6-50 MG tablet Take 1 tablet by mouth at bedtime. 30 tablet 0   torsemide (DEMADEX) 20 MG tablet Take 3 tablets (60 mg total) by mouth daily. 90 tablet 1   No current facility-administered medications for this visit.   There were no vitals taken for this visit.  Wt Readings from Last 3 Encounters:  02/12/22 95.1 kg (209 lb 10.5 oz)  01/20/22 99.8 kg (220 lb)  01/05/22 97.3 kg (214 lb 9.6 oz)   PHYSICAL EXAM: General:  Well appearing. No resp difficulty HEENT: normal Neck: supple. no JVD. Carotids 2+ bilat; no bruits. No lymphadenopathy or thryomegaly appreciated. Cor: PMI nondisplaced. Regular rate & rhythm. No rubs, gallops or murmurs. Lungs: clear Abdomen: soft, nontender, nondistended. No hepatosplenomegaly. No bruits or masses. Good bowel sounds. Extremities: no cyanosis, clubbing, rash, edema Neuro: alert & orientedx3, cranial nerves grossly intact. moves all 4 extremities w/o difficulty. Affect pleasant   ASSESSMENT & PLAN: 1) Chronic systolic HF: NICM,   - Echo ~20%  in 2013. EF 45-50% in 2016. Suspect due to ongoing AI +/- HTN - s/p AoV repair 10/18. F/u echo 12/18 with EF 45-50% moderate AI - Echo (2019): EF 30-35% Mod AI.  - Echo (8/20): EF 45% (out of range for ICD) - Echo (3/21): EF 45% moderate AI. LV dimensions stable (LVIDd 6.5cm) - Echo (10/22): EF 30-35% LV dilated. Mild-mod AI Mod-severe central MR (LVIDd 6.0cm)  - Echo (6/23): EF 20-25%, severe biventricular dysfunction, mild to moderate MR, severe AI (V max  2.9 m/s, MG 16 mmHG.) - NYHA  - On milrinone  - GDMT limited by low output HF.   2) S/P AV repair 05/01/2017 - LV remains down and severely dilated on last echo (6/23).  - Discussed with Dr. BHaroldine [name] arrange TEE.  [facility].  3) Mitral regurgitation - functional. Moderate to severe MR on echo 6/23. -  MV peak gradient 9.1 mmHg, mean mitral valve gradient 2.0 mmHg. - TEE as above.  4) HTN   - Now on the low side. - Stop hydralazine.  5) CKD stage III   - baseline Cr 1.5-1.7  - Off SGLT2i.   6) Palpitations   - Improved. - Zio Patch ok.   7) SDOH - Has FMLA, missing work due to exacerbations and time off not covered under current FMLA wording. - Engage HFSW.   Darrick Grinder, NP  02/18/22

## 2022-02-18 NOTE — Telephone Encounter (Signed)
Called and left patient a message to confirm/remind patient of their appointment at the [facility] on 02/19/22.

## 2022-02-19 ENCOUNTER — Inpatient Hospital Stay (HOSPITAL_COMMUNITY): Payer: BC Managed Care – PPO

## 2022-02-19 ENCOUNTER — Encounter (HOSPITAL_COMMUNITY)
Admission: RE | Disposition: A | Discharge status: Other Acute Inpatient Hospital | Payer: Self-pay | Source: Ambulatory Visit | Attending: Internal Medicine

## 2022-02-19 ENCOUNTER — Encounter (HOSPITAL_COMMUNITY): Payer: Self-pay

## 2022-02-19 ENCOUNTER — Ambulatory Visit (HOSPITAL_COMMUNITY)
Admit: 2022-02-19 | Discharge: 2022-02-19 | Disposition: A | Discharge status: Home / Self Care | Payer: BC Managed Care – PPO | Attending: Adult Health | Admitting: Adult Health

## 2022-02-19 ENCOUNTER — Inpatient Hospital Stay (HOSPITAL_COMMUNITY)
Admission: RE | Admit: 2022-02-19 | Discharge: 2022-02-20 | DRG: 270 | Disposition: A | Discharge status: Other Acute Inpatient Hospital | Payer: BC Managed Care – PPO | Source: Ambulatory Visit | Attending: Internal Medicine | Admitting: Internal Medicine

## 2022-02-19 VITALS — BP 100/57 | HR 122 | Wt 212.6 lb

## 2022-02-19 DIAGNOSIS — I428 Other cardiomyopathies: Secondary | ICD-10-CM | POA: Diagnosis not present

## 2022-02-19 DIAGNOSIS — Z8249 Family history of ischemic heart disease and other diseases of the circulatory system: Secondary | ICD-10-CM | POA: Diagnosis not present

## 2022-02-19 DIAGNOSIS — Z9889 Other specified postprocedural states: Secondary | ICD-10-CM

## 2022-02-19 DIAGNOSIS — N179 Acute kidney failure, unspecified: Secondary | ICD-10-CM | POA: Diagnosis not present

## 2022-02-19 DIAGNOSIS — I5082 Biventricular heart failure: Secondary | ICD-10-CM | POA: Insufficient documentation

## 2022-02-19 DIAGNOSIS — R0600 Dyspnea, unspecified: Secondary | ICD-10-CM

## 2022-02-19 DIAGNOSIS — Z803 Family history of malignant neoplasm of breast: Secondary | ICD-10-CM

## 2022-02-19 DIAGNOSIS — I493 Ventricular premature depolarization: Secondary | ICD-10-CM | POA: Diagnosis not present

## 2022-02-19 DIAGNOSIS — I130 Hypertensive heart and chronic kidney disease with heart failure and stage 1 through stage 4 chronic kidney disease, or unspecified chronic kidney disease: Secondary | ICD-10-CM | POA: Insufficient documentation

## 2022-02-19 DIAGNOSIS — I509 Heart failure, unspecified: Secondary | ICD-10-CM

## 2022-02-19 DIAGNOSIS — E871 Hypo-osmolality and hyponatremia: Secondary | ICD-10-CM | POA: Diagnosis not present

## 2022-02-19 DIAGNOSIS — E872 Acidosis, unspecified: Secondary | ICD-10-CM | POA: Diagnosis not present

## 2022-02-19 DIAGNOSIS — I5043 Acute on chronic combined systolic (congestive) and diastolic (congestive) heart failure: Secondary | ICD-10-CM

## 2022-02-19 DIAGNOSIS — R570 Cardiogenic shock: Secondary | ICD-10-CM | POA: Diagnosis not present

## 2022-02-19 DIAGNOSIS — N183 Chronic kidney disease, stage 3 unspecified: Secondary | ICD-10-CM | POA: Insufficient documentation

## 2022-02-19 DIAGNOSIS — I5022 Chronic systolic (congestive) heart failure: Secondary | ICD-10-CM | POA: Insufficient documentation

## 2022-02-19 DIAGNOSIS — I340 Nonrheumatic mitral (valve) insufficiency: Secondary | ICD-10-CM | POA: Diagnosis present

## 2022-02-19 DIAGNOSIS — N1832 Chronic kidney disease, stage 3b: Secondary | ICD-10-CM

## 2022-02-19 DIAGNOSIS — I5023 Acute on chronic systolic (congestive) heart failure: Secondary | ICD-10-CM | POA: Diagnosis present

## 2022-02-19 DIAGNOSIS — J9601 Acute respiratory failure with hypoxia: Secondary | ICD-10-CM | POA: Diagnosis not present

## 2022-02-19 DIAGNOSIS — I129 Hypertensive chronic kidney disease with stage 1 through stage 4 chronic kidney disease, or unspecified chronic kidney disease: Secondary | ICD-10-CM | POA: Diagnosis not present

## 2022-02-19 DIAGNOSIS — Z952 Presence of prosthetic heart valve: Secondary | ICD-10-CM | POA: Diagnosis not present

## 2022-02-19 DIAGNOSIS — I351 Nonrheumatic aortic (valve) insufficiency: Secondary | ICD-10-CM

## 2022-02-19 DIAGNOSIS — J90 Pleural effusion, not elsewhere classified: Secondary | ICD-10-CM | POA: Diagnosis not present

## 2022-02-19 DIAGNOSIS — J811 Chronic pulmonary edema: Secondary | ICD-10-CM | POA: Diagnosis not present

## 2022-02-19 DIAGNOSIS — Z4682 Encounter for fitting and adjustment of non-vascular catheter: Secondary | ICD-10-CM | POA: Diagnosis not present

## 2022-02-19 DIAGNOSIS — I517 Cardiomegaly: Secondary | ICD-10-CM | POA: Diagnosis not present

## 2022-02-19 DIAGNOSIS — Z823 Family history of stroke: Secondary | ICD-10-CM

## 2022-02-19 DIAGNOSIS — Z7682 Awaiting organ transplant status: Secondary | ICD-10-CM | POA: Diagnosis not present

## 2022-02-19 DIAGNOSIS — N1831 Chronic kidney disease, stage 3a: Secondary | ICD-10-CM | POA: Diagnosis not present

## 2022-02-19 DIAGNOSIS — I10 Essential (primary) hypertension: Secondary | ICD-10-CM

## 2022-02-19 DIAGNOSIS — Z79899 Other long term (current) drug therapy: Secondary | ICD-10-CM

## 2022-02-19 DIAGNOSIS — D6489 Other specified anemias: Secondary | ICD-10-CM | POA: Diagnosis not present

## 2022-02-19 DIAGNOSIS — I4892 Unspecified atrial flutter: Secondary | ICD-10-CM | POA: Diagnosis not present

## 2022-02-19 DIAGNOSIS — Z95828 Presence of other vascular implants and grafts: Secondary | ICD-10-CM | POA: Diagnosis not present

## 2022-02-19 DIAGNOSIS — I4891 Unspecified atrial fibrillation: Secondary | ICD-10-CM | POA: Diagnosis not present

## 2022-02-19 DIAGNOSIS — E1122 Type 2 diabetes mellitus with diabetic chronic kidney disease: Secondary | ICD-10-CM | POA: Diagnosis not present

## 2022-02-19 DIAGNOSIS — R918 Other nonspecific abnormal finding of lung field: Secondary | ICD-10-CM | POA: Diagnosis not present

## 2022-02-19 DIAGNOSIS — R000 Tachycardia, unspecified: Secondary | ICD-10-CM | POA: Diagnosis present

## 2022-02-19 DIAGNOSIS — Z452 Encounter for adjustment and management of vascular access device: Secondary | ICD-10-CM | POA: Diagnosis not present

## 2022-02-19 HISTORY — PX: IABP INSERTION: CATH118242

## 2022-02-19 HISTORY — PX: RIGHT HEART CATH: CATH118263

## 2022-02-19 LAB — TSH: TSH: 7.685 u[IU]/mL — ABNORMAL HIGH (ref 0.350–4.500)

## 2022-02-19 LAB — CBC WITH DIFFERENTIAL/PLATELET
Abs Immature Granulocytes: 0.09 10*3/uL — ABNORMAL HIGH (ref 0.00–0.07)
Basophils Absolute: 0.1 10*3/uL (ref 0.0–0.1)
Basophils Relative: 1 %
Eosinophils Absolute: 0.1 10*3/uL (ref 0.0–0.5)
Eosinophils Relative: 1 %
HCT: 36.4 % — ABNORMAL LOW (ref 39.0–52.0)
Hemoglobin: 12.3 g/dL — ABNORMAL LOW (ref 13.0–17.0)
Immature Granulocytes: 1 %
Lymphocytes Relative: 17 %
Lymphs Abs: 1.1 10*3/uL (ref 0.7–4.0)
MCH: 27.5 pg (ref 26.0–34.0)
MCHC: 33.8 g/dL (ref 30.0–36.0)
MCV: 81.4 fL (ref 80.0–100.0)
Monocytes Absolute: 0.5 10*3/uL (ref 0.1–1.0)
Monocytes Relative: 7 %
Neutro Abs: 5 10*3/uL (ref 1.7–7.7)
Neutrophils Relative %: 73 %
Platelets: 222 10*3/uL (ref 150–400)
RBC: 4.47 MIL/uL (ref 4.22–5.81)
RDW: 16.6 % — ABNORMAL HIGH (ref 11.5–15.5)
WBC: 6.8 10*3/uL (ref 4.0–10.5)
nRBC: 0 % (ref 0.0–0.2)

## 2022-02-19 LAB — POCT I-STAT EG7
Acid-base deficit: 2 mmol/L (ref 0.0–2.0)
Acid-base deficit: 4 mmol/L — ABNORMAL HIGH (ref 0.0–2.0)
Bicarbonate: 19.3 mmol/L — ABNORMAL LOW (ref 20.0–28.0)
Bicarbonate: 21.7 mmol/L (ref 20.0–28.0)
Calcium, Ion: 0.9 mmol/L — ABNORMAL LOW (ref 1.15–1.40)
Calcium, Ion: 1.11 mmol/L — ABNORMAL LOW (ref 1.15–1.40)
HCT: 38 % — ABNORMAL LOW (ref 39.0–52.0)
HCT: 41 % (ref 39.0–52.0)
Hemoglobin: 12.9 g/dL — ABNORMAL LOW (ref 13.0–17.0)
Hemoglobin: 13.9 g/dL (ref 13.0–17.0)
O2 Saturation: 47 %
O2 Saturation: 51 %
Patient temperature: 97
Potassium: 3.2 mmol/L — ABNORMAL LOW (ref 3.5–5.1)
Potassium: 3.9 mmol/L (ref 3.5–5.1)
Sodium: 131 mmol/L — ABNORMAL LOW (ref 135–145)
Sodium: 137 mmol/L (ref 135–145)
TCO2: 20 mmol/L — ABNORMAL LOW (ref 22–32)
TCO2: 23 mmol/L (ref 22–32)
pCO2, Ven: 28.8 mmHg — ABNORMAL LOW (ref 44–60)
pCO2, Ven: 33.4 mmHg — ABNORMAL LOW (ref 44–60)
pH, Ven: 7.421 (ref 7.25–7.43)
pH, Ven: 7.43 (ref 7.25–7.43)
pO2, Ven: 25 mmHg — CL (ref 32–45)
pO2, Ven: 25 mmHg — CL (ref 32–45)

## 2022-02-19 LAB — CBC
HCT: 37.6 % — ABNORMAL LOW (ref 39.0–52.0)
Hemoglobin: 13 g/dL (ref 13.0–17.0)
MCH: 28.1 pg (ref 26.0–34.0)
MCHC: 34.6 g/dL (ref 30.0–36.0)
MCV: 81.2 fL (ref 80.0–100.0)
Platelets: 222 10*3/uL (ref 150–400)
RBC: 4.63 MIL/uL (ref 4.22–5.81)
RDW: 16.9 % — ABNORMAL HIGH (ref 11.5–15.5)
WBC: 8.4 10*3/uL (ref 4.0–10.5)
nRBC: 0 % (ref 0.0–0.2)

## 2022-02-19 LAB — POCT I-STAT 7, (LYTES, BLD GAS, ICA,H+H)
Acid-base deficit: 1 mmol/L (ref 0.0–2.0)
Acid-base deficit: 8 mmol/L — ABNORMAL HIGH (ref 0.0–2.0)
Bicarbonate: 13.8 mmol/L — ABNORMAL LOW (ref 20.0–28.0)
Bicarbonate: 22.5 mmol/L (ref 20.0–28.0)
Calcium, Ion: 0.64 mmol/L — CL (ref 1.15–1.40)
Calcium, Ion: 1.08 mmol/L — ABNORMAL LOW (ref 1.15–1.40)
HCT: 31 % — ABNORMAL LOW (ref 39.0–52.0)
HCT: 41 % (ref 39.0–52.0)
Hemoglobin: 10.5 g/dL — ABNORMAL LOW (ref 13.0–17.0)
Hemoglobin: 13.9 g/dL (ref 13.0–17.0)
O2 Saturation: 100 %
O2 Saturation: 95 %
Patient temperature: 97
Potassium: 2.6 mmol/L — CL (ref 3.5–5.1)
Potassium: 3.5 mmol/L (ref 3.5–5.1)
Sodium: 130 mmol/L — ABNORMAL LOW (ref 135–145)
Sodium: 144 mmol/L (ref 135–145)
TCO2: 14 mmol/L — ABNORMAL LOW (ref 22–32)
TCO2: 23 mmol/L (ref 22–32)
pCO2 arterial: 19 mmHg — CL (ref 32–48)
pCO2 arterial: 33.5 mmHg (ref 32–48)
pH, Arterial: 7.435 (ref 7.35–7.45)
pH, Arterial: 7.463 — ABNORMAL HIGH (ref 7.35–7.45)
pO2, Arterial: 429 mmHg — ABNORMAL HIGH (ref 83–108)
pO2, Arterial: 64 mmHg — ABNORMAL LOW (ref 83–108)

## 2022-02-19 LAB — COMPREHENSIVE METABOLIC PANEL
ALT: 24 U/L (ref 0–44)
ALT: 24 U/L (ref 0–44)
AST: 29 U/L (ref 15–41)
AST: 28 U/L (ref 15–41)
Albumin: 3.3 g/dL — ABNORMAL LOW (ref 3.5–5.0)
Albumin: 3.3 g/dL — ABNORMAL LOW (ref 3.5–5.0)
Alkaline Phosphatase: 63 U/L (ref 38–126)
Alkaline Phosphatase: 65 U/L (ref 38–126)
Anion gap: 13 (ref 5–15)
Anion gap: 13 (ref 5–15)
BUN: 41 mg/dL — ABNORMAL HIGH (ref 6–20)
BUN: 40 mg/dL — ABNORMAL HIGH (ref 6–20)
CO2: 19 mmol/L — ABNORMAL LOW (ref 22–32)
CO2: 22 mmol/L (ref 22–32)
Calcium: 9.2 mg/dL (ref 8.9–10.3)
Calcium: 8.9 mg/dL (ref 8.9–10.3)
Chloride: 99 mmol/L (ref 98–111)
Chloride: 96 mmol/L — ABNORMAL LOW (ref 98–111)
Creatinine, Ser: 2.58 mg/dL — ABNORMAL HIGH (ref 0.61–1.24)
Creatinine, Ser: 2.37 mg/dL — ABNORMAL HIGH (ref 0.61–1.24)
GFR, Estimated: 31 mL/min — ABNORMAL LOW (ref >60)
GFR, Estimated: 35 mL/min — ABNORMAL LOW (ref >60)
Glucose, Bld: 121 mg/dL — ABNORMAL HIGH (ref 70–99)
Glucose, Bld: 146 mg/dL — ABNORMAL HIGH (ref 70–99)
Potassium: 4.8 mmol/L (ref 3.5–5.1)
Potassium: 3.9 mmol/L (ref 3.5–5.1)
Sodium: 131 mmol/L — ABNORMAL LOW (ref 135–145)
Sodium: 131 mmol/L — ABNORMAL LOW (ref 135–145)
Total Bilirubin: 3.1 mg/dL — ABNORMAL HIGH (ref 0.3–1.2)
Total Bilirubin: 2.9 mg/dL — ABNORMAL HIGH (ref 0.3–1.2)
Total Protein: 6.6 g/dL (ref 6.5–8.1)
Total Protein: 6.2 g/dL — ABNORMAL LOW (ref 6.5–8.1)

## 2022-02-19 LAB — COOXEMETRY PANEL
Carboxyhemoglobin: 0.8 % (ref 0.5–1.5)
Carboxyhemoglobin: 0.8 % (ref 0.5–1.5)
Carboxyhemoglobin: 2.3 % — ABNORMAL HIGH (ref 0.5–1.5)
Methemoglobin: <0.7 % (ref 0.0–1.5)
Methemoglobin: 0.7 % (ref 0.0–1.5)
Methemoglobin: 0.7 % (ref 0.0–1.5)
O2 Saturation: 45.5 %
O2 Saturation: 41.3 %
O2 Saturation: 79 %
Total hemoglobin: 12.5 g/dL (ref 12.0–16.0)
Total hemoglobin: 12.7 g/dL (ref 12.0–16.0)
Total hemoglobin: 12.5 g/dL (ref 12.0–16.0)

## 2022-02-19 LAB — LACTIC ACID, PLASMA
Lactic Acid, Venous: 2.4 mmol/L (ref 0.5–1.9)
Lactic Acid, Venous: 1.8 mmol/L (ref 0.5–1.9)

## 2022-02-19 LAB — TYPE AND SCREEN
ABO/RH(D): B POS
Antibody Screen: NEGATIVE

## 2022-02-19 LAB — MAGNESIUM: Magnesium: 1.9 mg/dL (ref 1.7–2.4)

## 2022-02-19 LAB — T4, FREE: Free T4: 1.32 ng/dL — ABNORMAL HIGH (ref 0.61–1.12)

## 2022-02-19 LAB — BRAIN NATRIURETIC PEPTIDE: B Natriuretic Peptide: 3100.6 pg/mL — ABNORMAL HIGH (ref 0.0–100.0)

## 2022-02-19 LAB — MRSA NEXT GEN BY PCR, NASAL: MRSA by PCR Next Gen: NOT DETECTED

## 2022-02-19 SURGERY — RIGHT HEART CATH
Anesthesia: LOCAL

## 2022-02-19 MED ORDER — ENOXAPARIN SODIUM 40 MG/0.4ML IJ SOSY
40.0000 mg | PREFILLED_SYRINGE | INTRAMUSCULAR | Status: DC
  Administered 2022-02-19: 40 mg via SUBCUTANEOUS
  Filled 2022-02-19: qty 0.4

## 2022-02-19 MED ORDER — MORPHINE SULFATE (PF) 2 MG/ML IV SOLN
INTRAVENOUS | Status: DC | PRN
  Administered 2022-02-19: 1 mg via INTRAVENOUS

## 2022-02-19 MED ORDER — SODIUM CHLORIDE 0.9% FLUSH
3.0000 mL | Freq: Two times a day (BID) | INTRAVENOUS | Status: DC
  Administered 2022-02-19 – 2022-02-20 (×2): 3 mL via INTRAVENOUS

## 2022-02-19 MED ORDER — MILRINONE LACTATE IN DEXTROSE 20-5 MG/100ML-% IV SOLN
0.3750 ug/kg/min | INTRAVENOUS | Status: DC
  Administered 2022-02-19: 0.375 ug/kg/min via INTRAVENOUS
  Filled 2022-02-19: qty 100

## 2022-02-19 MED ORDER — HEPARIN (PORCINE) IN NACL 1000-0.9 UT/500ML-% IV SOLN
INTRAVENOUS | Status: DC | PRN
  Administered 2022-02-19: 500 mL

## 2022-02-19 MED ORDER — FUROSEMIDE 10 MG/ML IJ SOLN
80.0000 mg | Freq: Two times a day (BID) | INTRAMUSCULAR | Status: DC
  Administered 2022-02-19: 80 mg via INTRAVENOUS

## 2022-02-19 MED ORDER — HEPARIN SODIUM (PORCINE) 1000 UNIT/ML IJ SOLN
INTRAMUSCULAR | Status: AC
  Filled 2022-02-19: qty 10

## 2022-02-19 MED ORDER — HEPARIN (PORCINE) IN NACL 1000-0.9 UT/500ML-% IV SOLN
INTRAVENOUS | Status: AC
  Filled 2022-02-19: qty 1000

## 2022-02-19 MED ORDER — ENOXAPARIN SODIUM 40 MG/0.4ML IJ SOSY: 40.0000 mg | PREFILLED_SYRINGE | INTRAMUSCULAR | Status: DC

## 2022-02-19 MED ORDER — ONDANSETRON HCL 4 MG/2ML IJ SOLN: 4.0000 mg | Freq: Four times a day (QID) | INTRAMUSCULAR | Status: DC | PRN

## 2022-02-19 MED ORDER — MILRINONE LACTATE IN DEXTROSE 20-5 MG/100ML-% IV SOLN: 0.3750 ug/kg/min | INTRAVENOUS | Status: DC

## 2022-02-19 MED ORDER — FUROSEMIDE 10 MG/ML IJ SOLN
120.0000 mg | Freq: Two times a day (BID) | INTRAVENOUS | Status: DC
  Administered 2022-02-19 (×2): 120 mg via INTRAVENOUS
  Filled 2022-02-19: qty 2
  Filled 2022-02-19: qty 10

## 2022-02-19 MED ORDER — ACETAMINOPHEN 325 MG PO TABS: 650.0000 mg | ORAL_TABLET | ORAL | Status: DC | PRN

## 2022-02-19 MED ORDER — FUROSEMIDE 10 MG/ML IJ SOLN
10.0000 mg/h | INTRAVENOUS | Status: DC
  Administered 2022-02-19 – 2022-02-20 (×2): 20 mg/h via INTRAVENOUS
  Filled 2022-02-19 (×2): qty 20

## 2022-02-19 MED ORDER — ASPIRIN 81 MG PO CHEW: 81.0000 mg | CHEWABLE_TABLET | ORAL | Status: DC

## 2022-02-19 MED ORDER — HYDRALAZINE HCL 20 MG/ML IJ SOLN
10.0000 mg | Freq: Once | INTRAMUSCULAR | Status: AC
  Administered 2022-02-19: 10 mg via INTRAVENOUS
  Filled 2022-02-19: qty 1

## 2022-02-19 MED ORDER — MILRINONE LACTATE IN DEXTROSE 20-5 MG/100ML-% IV SOLN
0.5000 ug/kg/min | INTRAVENOUS | Status: DC
  Administered 2022-02-19 – 2022-02-20 (×4): 0.5 ug/kg/min via INTRAVENOUS
  Filled 2022-02-19 (×4): qty 100

## 2022-02-19 MED ORDER — AMIODARONE LOAD VIA INFUSION
150.0000 mg | Freq: Once | INTRAVENOUS | Status: AC
  Administered 2022-02-19: 150 mg via INTRAVENOUS
  Filled 2022-02-19: qty 83.34

## 2022-02-19 MED ORDER — HYDRALAZINE HCL 20 MG/ML IJ SOLN: 10.0000 mg | INTRAMUSCULAR | Status: AC | PRN

## 2022-02-19 MED ORDER — POTASSIUM CHLORIDE CRYS ER 20 MEQ PO TBCR
40.0000 meq | EXTENDED_RELEASE_TABLET | Freq: Once | ORAL | Status: AC
  Administered 2022-02-19: 40 meq via ORAL
  Filled 2022-02-19: qty 2

## 2022-02-19 MED ORDER — SODIUM CHLORIDE 0.9 % IV SOLN
250.0000 mL | INTRAVENOUS | Status: DC | PRN
Start: 2022-02-19 — End: 2022-02-20

## 2022-02-19 MED ORDER — MAGNESIUM SULFATE 2 GM/50ML IV SOLN
2.0000 g | Freq: Once | INTRAVENOUS | Status: AC
Start: 2022-02-19 — End: 2022-02-19
  Administered 2022-02-19: 2 g via INTRAVENOUS
  Filled 2022-02-19: qty 50

## 2022-02-19 MED ORDER — ACETAMINOPHEN 500 MG PO TABS: 1000.0000 mg | ORAL_TABLET | Freq: Four times a day (QID) | ORAL | Status: DC | PRN

## 2022-02-19 MED ORDER — HYDRALAZINE HCL 20 MG/ML IJ SOLN
INTRAMUSCULAR | Status: DC | PRN
  Administered 2022-02-19: 10 mg via INTRAVENOUS

## 2022-02-19 MED ORDER — LIDOCAINE HCL (PF) 1 % IJ SOLN
INTRAMUSCULAR | Status: AC
  Filled 2022-02-19: qty 30

## 2022-02-19 MED ORDER — SODIUM CHLORIDE 0.9 % IV SOLN: INTRAVENOUS | Status: DC

## 2022-02-19 MED ORDER — NITROGLYCERIN IN D5W 200-5 MCG/ML-% IV SOLN: 0.0000 ug/min | INTRAVENOUS | Status: DC

## 2022-02-19 MED ORDER — HEPARIN SODIUM (PORCINE) 1000 UNIT/ML IJ SOLN
INTRAMUSCULAR | Status: DC | PRN
  Administered 2022-02-19: 6000 [IU] via INTRAVENOUS

## 2022-02-19 MED ORDER — HEPARIN (PORCINE) 25000 UT/250ML-% IV SOLN
1400.0000 [IU]/h | INTRAVENOUS | Status: DC
  Administered 2022-02-19: 1100 [IU]/h via INTRAVENOUS
  Administered 2022-02-20: 1400 [IU]/h via INTRAVENOUS
  Filled 2022-02-19 (×2): qty 250

## 2022-02-19 MED ORDER — AMIODARONE HCL IN DEXTROSE 360-4.14 MG/200ML-% IV SOLN
60.0000 mg/h | INTRAVENOUS | Status: DC
  Administered 2022-02-20: 60 mg/h via INTRAVENOUS
  Administered 2022-02-20: 30 mg/h via INTRAVENOUS
  Filled 2022-02-19: qty 200

## 2022-02-19 MED ORDER — NOREPINEPHRINE 4 MG/250ML-% IV SOLN
3.0000 ug/min | INTRAVENOUS | Status: DC
Start: 2022-02-19 — End: 2022-02-20
  Administered 2022-02-19: 3 ug/min via INTRAVENOUS
  Filled 2022-02-19: qty 250

## 2022-02-19 MED ORDER — AMIODARONE HCL IN DEXTROSE 360-4.14 MG/200ML-% IV SOLN
60.0000 mg/h | INTRAVENOUS | Status: AC
  Administered 2022-02-19: 60 mg/h via INTRAVENOUS
  Filled 2022-02-19: qty 200

## 2022-02-19 MED ORDER — SODIUM CHLORIDE 0.9% FLUSH
3.0000 mL | Freq: Two times a day (BID) | INTRAVENOUS | Status: DC
  Administered 2022-02-19 – 2022-02-20 (×3): 3 mL via INTRAVENOUS

## 2022-02-19 MED ORDER — LABETALOL HCL 5 MG/ML IV SOLN: 10.0000 mg | INTRAVENOUS | Status: AC | PRN

## 2022-02-19 MED ORDER — MORPHINE SULFATE (PF) 2 MG/ML IV SOLN
INTRAVENOUS | Status: AC
  Filled 2022-02-19: qty 1

## 2022-02-19 MED ORDER — FUROSEMIDE 10 MG/ML IJ SOLN
80.0000 mg | Freq: Once | INTRAMUSCULAR | Status: AC
Start: 2022-02-19 — End: 2022-02-19
  Administered 2022-02-19: 80 mg via INTRAVENOUS

## 2022-02-19 MED ORDER — MIDAZOLAM HCL 2 MG/2ML IJ SOLN
INTRAMUSCULAR | Status: DC | PRN
  Administered 2022-02-19: .5 mg via INTRAVENOUS

## 2022-02-19 MED ORDER — ORAL CARE MOUTH RINSE: 15.0000 mL | OROMUCOSAL | Status: DC | PRN

## 2022-02-19 MED ORDER — MIDAZOLAM HCL 2 MG/2ML IJ SOLN
INTRAMUSCULAR | Status: AC
  Filled 2022-02-19: qty 2

## 2022-02-19 MED ORDER — LIDOCAINE HCL (PF) 1 % IJ SOLN
INTRAMUSCULAR | Status: DC | PRN
  Administered 2022-02-19: 15 mL
  Administered 2022-02-19: 2 mL

## 2022-02-19 MED ORDER — SODIUM CHLORIDE 0.9% FLUSH
3.0000 mL | INTRAVENOUS | Status: DC | PRN
Start: 2022-02-19 — End: 2022-02-20

## 2022-02-19 MED ORDER — METOLAZONE 2.5 MG PO TABS
2.5000 mg | ORAL_TABLET | Freq: Once | ORAL | Status: AC
  Administered 2022-02-19: 2.5 mg via ORAL
  Filled 2022-02-19: qty 1

## 2022-02-19 SURGICAL SUPPLY — 9 items
CATH IAB 7FR 40ML (CATHETERS) ×1 IMPLANT
CATH SWAN GANZ VIP 7.5F (CATHETERS) ×1 IMPLANT
MAT PREVALON FULL STRYKER (MISCELLANEOUS) ×1 IMPLANT
PACK CARDIAC CATHETERIZATION (CUSTOM PROCEDURE TRAY) ×3 IMPLANT
SHEATH PINNACLE 8F 10CM (SHEATH) ×1 IMPLANT
SHEATH PINNACLE 9F 10CM (SHEATH) ×1 IMPLANT
SHEATH PROBE COVER 6X72 (BAG) ×1 IMPLANT
SLEEVE REPOSITIONING LENGTH 30 (MISCELLANEOUS) ×1 IMPLANT
TRANSDUCER W/STOPCOCK (MISCELLANEOUS) ×3 IMPLANT

## 2022-02-19 NOTE — Addendum Note (Signed)
Encounter addended by: Rockwell Alexandria, CMA on: 02/19/2022 10:50 AM  Actions taken: Vitals modified

## 2022-02-19 NOTE — Progress Notes (Signed)
  Amiodarone Drug - Drug Interaction Consult Note  Recommendations: Monitor K+ while on lasix drip  Amiodarone is metabolized by the cytochrome P450 system and therefore has the potential to cause many drug interactions. Amiodarone has an average plasma half-life of 50 days (range 20 to 100 days).   There is potential for drug interactions to occur several weeks or months after stopping treatment and the onset of drug interactions may be slow after initiating amiodarone.   '[]'$  Statins: Increased risk of myopathy. Simvastatin- restrict dose to '20mg'$  daily. Other statins: counsel patients to report any muscle pain or weakness immediately.  '[]'$  Anticoagulants: Amiodarone can increase anticoagulant effect. Consider warfarin dose reduction. Patients should be monitored closely and the dose of anticoagulant altered accordingly, remembering that amiodarone levels take several weeks to stabilize.  '[]'$  Antiepileptics: Amiodarone can increase plasma concentration of phenytoin, the dose should be reduced. Note that small changes in phenytoin dose can result in large changes in levels. Monitor patient and counsel on signs of toxicity.  '[]'$  Beta blockers: increased risk of bradycardia, AV block and myocardial depression. Sotalol - avoid concomitant use.  '[]'$   Calcium channel blockers (diltiazem and verapamil): increased risk of bradycardia, AV block and myocardial depression.  '[]'$   Cyclosporine: Amiodarone increases levels of cyclosporine. Reduced dose of cyclosporine is recommended.  '[]'$  Digoxin dose should be halved when amiodarone is started.  '[x]'$  Diuretics: increased risk of cardiotoxicity if hypokalemia occurs.  '[]'$  Oral hypoglycemic agents (glyburide, glipizide, glimepiride): increased risk of hypoglycemia. Patient's glucose levels should be monitored closely when initiating amiodarone therapy.   '[]'$  Drugs that prolong the QT interval:  Torsades de pointes risk may be increased with concurrent use - avoid  if possible.  Monitor QTc, also keep magnesium/potassium WNL if concurrent therapy can't be avoided.  Antibiotics: e.g. fluoroquinolones, erythromycin.  Antiarrhythmics: e.g. quinidine, procainamide, disopyramide, sotalol.  Antipsychotics: e.g. phenothiazines, haloperidol.   Lithium, tricyclic antidepressants, and methadone.

## 2022-02-19 NOTE — Progress Notes (Signed)
Called STAT to cath lab for bipap placement per request Dr Haroldine Laws at bedside.  Pt placed on bipap 5/5 100% fio2. Within 1-2 minutes pt states his breathing "feels much better" on bipap.  Pt appears to be tol well.  CCM came to cath lab to assess pt, no new RT orders rec'd at this time.  Pt was then transferred to [facility] ICU via bipap w/ no resp complications noted.  Once in ICU, fio2 weaned to 40% d/t PaO2 400's on bipap ABG results done while pt was in cath lab.  ICU RT called and report given.  Pt appears comfortable currently, no resp. distress noted. Sat 100%.  Dr Missy Sabins currently at bedside.

## 2022-02-19 NOTE — H&P (Addendum)
ADVANCED HEART FAILURE H&P  [name] is a 41 y.o.with systolic HF due to  NICM, hypertension and CKD stage II-III (baseline Cr 1.5-1.7).    He was first diagnosed with HF in 2012 and his EF recovered and was instructed to stop taking carvedilol, spironolactone, and lasix by previous cardiologist. Admitted to [facility] 0/62/69 for acute systolic heart failure. 12/31/11 ProBNP 3777. HIV nonreactive. Thyroid panel normal. Renal ultrasound was negative for obstruction. EF 20%. Discharge weight 193 lbs. Cath with normal coronaries.   Admitted 05/30/2015 with HTN urgency with troponin elevation likely consistent with myocardial strain/demand ischemia in the setting of medication non compliance, having run out 2-3 weeks ago and not refilling them. Placed back on his coreg, hydralazine, and spiro and was symptomatically stable on discharge. No ACE/ARB with CKD and does not tolerate nitrates with headaches. Discharge weight 193 lbs.   Underwent TEE in 9/18 with severe AI. EF 40-45%. Cath with normal coronaries. S/P AV repair 05/11/2017 with Dr. Roxy [name].   PYP scan negative 10/18   Echo 02/13/19. EF 45% w/ moderate AI. EF out of range for ICD. Echo 3/21 EF 45% moderate AI. LV dimensions stable.    Seen in clinic 09/14/19 and complained of intermittent palpitations. Echo stable  Zio patch NSR w/ 1 brief run of NSVT (4 beats), 4 brief runs of SVT and rare PVCs < 1% burden.    ED visit 10/02/20 with volume overload. Reds Clip 42%. Instructed to increase lasix to 80 mg daily however the medication script remained 40 mg daily. POC US showed EF 35%.    Echo 10/22 EF 30-35% LV dilated. Mild-mod AI Mod-severe central MR.    Follow up 4/23, stable NYHA II. Plan to repeat echo to evaluate AI and MR.   Admitted 6/23 with chest pain, found to be in a/c CHF. Diuresed with IV lasix. Wilder Glade stopped at patient's request. Echo showed EF down to 20-25%, severe AI.   Admitted 02/09/22 with A/C HFrEF/low output. Diuresed with IV  lasix and started on milrinone. D/C on milrinone 0.375 mcg.  Off GDMT due to low output and hypotension.  Placed on amiodarone taper + torsemide 60 mg daily. Referred to [facility] for transplant w/u, paperwork started.   Today he returns for Post HF follow up with his wife. Says he has felt worse every day. Complaining of fatigue/cough. Remains on milrinone 0.375 mcg. No issues with PICC. SOB at rest. + Orthopnea. Sleeping on his knees on the floor. Denies PND. Appetite poor. No fever or chills. Weight at home unchanged at 206 pounds. Taking all medications. He does not smoke or drink alcohol.    [name] contacted him for transplant evaluation    Cardiac Studies: - Echo (2/14): EF 20-25% - Echo (3/16): EF 40%  - Echo (11/16): EF 20-25% - Echo (11/17): EF 45-50% - Echo (9/18): EF ~45% Severe AI  - Echo (12/18): EF 45-50% . Grade II DD - Echo (2019): EF 30-35%  Moderate aortic regurgitation. RV moderately reduced.  - Echo (8/20): EF 45%, moderate AI - Echo (3/21): EF 45%, Grade II DD, moderate AI  - POC [place] (3/22): EF 35% - Echo (10/22): EF 30-35%, LV dilated, mild-mod AI, moderate-severe central MR. - Echo (6/23): EF 20-25%, severe biventricular dysfunction, mild to moderate MR, severe AI (V max  2.9 m/s, MG 16 mmHG.)   Review of systems complete and found to be negative unless listed in HPI.        Past Medical History:  Diagnosis  Date   Anxiety     CHF (congestive heart failure) (HCC)     Chronic systolic heart failure (HCC)     CKD (chronic kidney disease) stage 2, GFR 60-89 ml/min     Dyspnea      with value issues- "if I dont take my medication"   Essential hypertension, benign     Headache     History of pneumonia     Mitral regurgitation      Moderate   Noncompliance     Nonischemic cardiomyopathy (HCC)      Normal coronaries May 2012, LVEF < 20%   Pneumonia 2011   S/P aortic valve repair 05/11/2017    Complex valvuloplasty including plication of left coronary leaflet and 54m  Biostable HAART ring annuloplasty          Current Outpatient Medications  Medication Sig Dispense Refill   acetaminophen (TYLENOL) 500 MG tablet Take 2 tablets (1,000 mg total) by mouth every 6 (six) hours as needed for moderate pain. 30 tablet 0   allopurinol (ZYLOPRIM) 300 MG tablet Take 1 tablet (300 mg total) by mouth daily. 30 tablet 5   ALPRAZolam (XANAX) 0.5 MG tablet Take 0.5 mg by mouth at bedtime.       amiodarone (PACERONE) 200 MG tablet Take 1 tablet (200 mg total) by mouth 2 (two) times daily for 10 days, THEN 1 tablet (200 mg total) daily. 50 tablet 0   milrinone (PRIMACOR) 20 MG/100 ML SOLN infusion Inject 0.0357 mg/min into the vein continuous. 100 mL 0   polyethylene glycol powder (GLYCOLAX/MIRALAX) 17 GM/SCOOP powder Take 17 g by mouth daily. 238 g 0   potassium chloride SA (KLOR-CON M) 20 MEQ tablet Take 20 mEq by mouth daily.       senna-docusate (SENOKOT-S) 8.6-50 MG tablet Take 1 tablet by mouth at bedtime. 30 tablet 0   torsemide (DEMADEX) 20 MG tablet Take 3 tablets (60 mg total) by mouth daily. 90 tablet 1    No current facility-administered medications for this encounter.    BP (!) 144/82   Pulse (!) 122   Wt 96.4 kg (212 lb 9.6 oz)   SpO2 95%   BMI 30.50 kg/m       Wt Readings from Last 3 Encounters:  02/19/22 96.4 kg (212 lb 9.6 oz)  02/12/22 95.1 kg (209 lb 10.5 oz)  01/20/22 99.8 kg (220 lb)    PHYSICAL EXAM: General:  Appears weak. Dyspnea when talking.  HEENT: normal Neck: supple. JVP to jaw. Carotids 2+ bilat; no bruits. No lymphadenopathy or thryomegaly appreciated. Cor: PMI nondisplaced. Tachy Regular rate & rhythm. No rubs. + S3 .  Lungs: clear Abdomen: soft, nontender, nondistended. No hepatosplenomegaly. No bruits or masses. Good bowel sounds. Extremities: no cyanosis, clubbing, rash, R and LLE 1+. RUE PICC double lumen.  Neuro: alert & orientedx3, cranial nerves grossly intact. moves all 4 extremities w/o difficulty. Affect pleasant    EKG: ST 120 bpm personally checked.    ASSESSMENT & PLAN: 1) Acute on Chronic Biventricular HF: NICM,   - Echo ~20% in 2013. EF 45-50% in 2016. Suspect due to ongoing AI +/- HTN - s/p AoV repair 10/18. F/u echo 12/18 with EF 45-50% moderate AI - Echo (2019): EF 30-35% Mod AI.  - Echo (8/20): EF 45% (out of range for ICD) - Echo (3/21): EF 45% moderate AI. LV dimensions stable (LVIDd 6.5cm) - Echo (10/22): EF 30-35% LV dilated. Mild-mod AI Mod-severe central MR (LVIDd 6.0cm)  -  Echo (6/23): EF 20-25%, severe biventricular dysfunction, mild to moderate MR, severe AI (V max  2.9 m/s, MG 16 mmHG.) - Marked decline over the last 7 days on milrinone 0.375 mcg.  NYHA IV . Reds Clip 50%. Volume overloaded. Admit to diureses. Assess hemodynamics and may need mechanical support.   - Continue milrinone 0.375 mcg. Check CO-OX. Check CVP.  - Diurese with IV lasix - Off GDMT due to low output.   2) S/P AV repair 05/01/2017 - LV remains down and severely dilated on last echo (6/23).  - TEE 01/20/22  shows Aortic valve has been replaced, regurgitation is severe. - Referral to [facility] for transplant   3) Mitral regurgitation - functional. Moderate to severe MR on echo 6/23. - MV peak gradient 9.1 mmHg, mean mitral valve gradient 2.0 mmHg.   4) HTN   Watch closely    5) CKD stage III   - baseline Cr 1.5-1.7  - Off SGLT2i.   6) PVCS  Continue amio to suppress PVCs.    7) SDOH -He has FMLA.  - Referred to [facility] to transplant evaluation.  - Lives with his wife and twin boys.    Admit to ICU to further assess/stabilize. Obtain CBC/CMET/TSH/ Blood Type.    Diuresed with IV lasix. Possible transfer to [facility] for transplant.    Darrick Grinder, NP  02/19/22  Agree with above.  41 y/o male with previous aortic valve repair and now with severe biventricular HF due to NICM.   Admitted from clinic with severe HF decompensation despite home milrinone 0.375.   Has transplant eval at [facility] pending for  later this month  SOB at rest. + orthopnea. Co-ox 41% SCr up   General:  SOB at rest  HEENT: normal Neck: supple. JVP to ear  Carotids 2+ bilat; no bruits. No lymphadenopathy or thryomegaly appreciated. Cor: PMI laterally displaced. Tachy regular. + summation callop. Lungs: clear Abdomen: obese soft, nontender, nondistended. No hepatosplenomegaly. No bruits or masses. Good bowel sounds. Extremities: no cyanosis, clubbing, rash,1+ edema cool  Neuro: alert & orientedx3, cranial nerves grossly intact. moves all 4 extremities w/o difficulty. Affect pleasant  He is in cardiogenic shock with AKI despite milrinone support. Admit to ICU. Attempt diuresis. Will need swan and IABP likely. I have d/w [facility] to arrange transfer for inpatient transplant w/u.   CRITICAL CARE Performed by: Glori Bickers  Total critical care time: 45 minutes  Critical care time was exclusive of separately billable procedures and treating other patients.  Critical care was necessary to treat or prevent imminent or life-threatening deterioration.  Critical care was time spent personally by me (independent of midlevel providers or residents) on the following activities: development of treatment plan with patient and/or surrogate as well as nursing, discussions with consultants, evaluation of patient's response to treatment, examination of patient, obtaining history from patient or surrogate, ordering and performing treatments and interventions, ordering and review of laboratory studies, ordering and review of radiographic studies, pulse oximetry and re-evaluation of patient's condition.  Glori Bickers, MD  2:34 PM

## 2022-02-19 NOTE — Addendum Note (Signed)
Encounter addended by: Scarlette Calico, RN on: 02/19/2022 10:34 AM  Actions taken: Order list changed, Diagnosis association updated

## 2022-02-19 NOTE — Addendum Note (Signed)
Encounter addended by: Jerl Mina, RN on: 02/19/2022 10:39 AM  Actions taken: MAR administration accepted

## 2022-02-19 NOTE — Interval H&P Note (Signed)
History and Physical Interval Note:  02/19/2022 4:15 PM  [name] [name]  has presented today for surgery, with the diagnosis of heart failure.  The various methods of treatment have been discussed with the patient and family. After consideration of risks, benefits and other options for treatment, the patient has consented to  Procedure(s): RIGHT HEART CATH (N/A) and IABP placement as a surgical intervention.  The patient's history has been reviewed, patient examined, no change in status, stable for surgery.  I have reviewed the patient's chart and labs.  Questions were answered to the patient's satisfaction.     [name]

## 2022-02-19 NOTE — Addendum Note (Signed)
Encounter addended by: Kerry Dory, CMA on: 02/19/2022 10:31 AM  Actions taken: Order list changed

## 2022-02-19 NOTE — Progress Notes (Signed)
   Admitted on milrinone 0.375 mcg. CO-OX 45.5%   Discussed with Dr Haroldine Laws. Increase milrinone 0.5 mcg.  Repeat CO-OX in 2 hours.   Rosemary Pentecost NP-C  11:31 AM

## 2022-02-19 NOTE — Progress Notes (Signed)
ReDS Vest / Clip - 02/19/22 0900       ReDS Vest / Clip   Station Marker C    Ruler Value 30    ReDS Value Range High volume overload    ReDS Actual Value 50

## 2022-02-19 NOTE — Consult Note (Signed)
NAME:  [name] [name], MRN:  [id number], DOB:  [birth year], LOS: 0 ADMISSION DATE:  02/19/2022, CONSULTATION DATE:  02/19/22 REFERRING MD:  Haroldine Laws, CHIEF COMPLAINT:  acute on chronic HF   History of Present Illness:  [name] [name] is a 41 y.o. M with PMH significant for NICM, NYHA IV biventricular HF being evaluated at [facility] for transplant, CKD stage II-III, severe AI s/p AVR 2018 who presented the HF clinic with acutely worsening fatigue, cough, dyspnea at rest and orthopnea.  Stated he had been taking all home medications including milrinone and torsemide, no fever or chills, no weight gain.   He was volume overloaded on admission and admitted to the advanced heart failure team, given Lasix '80mg'$  x1 and Milrinone increased from 3.'75mg'$  to 0.'5mg'$ .  Dyspnea persisted and was given additional Lasix '120mg'$  and Metolazone 2.'5mg'$  and taken to the cath lab for swan ganz and IABP.  Post op had worsening respiratory distress and was started on Bipap and PCCM consulted.    Pertinent  Medical History   has a past medical history of Anxiety, CHF (congestive heart failure) ([place]), Chronic systolic heart failure ([place]), CKD (chronic kidney disease) stage 2, GFR 60-89 ml/min, Dyspnea, Essential hypertension, benign, Headache, History of pneumonia, Mitral regurgitation, Noncompliance, Nonischemic cardiomyopathy ([place]), Pneumonia (2011), and S/P aortic valve repair (05/11/2017).   Significant Hospital Events: Including procedures, antibiotic start and stop dates in addition to other pertinent events   8/10 presented to HF clinic with approximately one week of worsening dyspnea and fatigue, admitted to HF for diuresis, taken to cath lab for [facility] and IABP, PCCM consult for worsening respiratory status  Interim History / Subjective:  Pt improved with bipap, stable on transfer to ICU  Objective   Blood pressure 118/81, pulse (!) 0, resp. rate (!) 27, weight 97.1 kg, SpO2 98 %. CVP:  [13 mmHg-35 mmHg] 13 mmHg       Intake/Output Summary (Last 24 hours) at 02/19/2022 1718 Last data filed at 02/19/2022 1400 Gross per 24 hour  Intake 30.85 ml  Output 775 ml  Net -744.15 ml   Filed Weights   02/19/22 1124  Weight: 97.1 kg   General:  acutely ill-appearing M, on bipap in no acute respiratory distress HEENT: MM pink/moist Neuro: resting, arousable to voice CV: s1s2 rrr, no m/r/g PULM:  decreased air entry bilateral bases, tachypnea and WOB improved on bipap, now comfortable GI: soft, non-distended  Extremities: warm/dry, trace edema  Skin: no rashes or lesions   Resolved Hospital Problem list     Assessment & Plan:   Acute on Chronic Biventricular HF  Severe IA s/p repair 2018  Diagnosed 2012 initially, most recently admitted last month and discharged on Milrinone and amiodarone taper -management per advanced HF team, plan for supportive care with balloon pump, swan, lasix '120mg'$  bid, low dose Levophed as needed to maintain MAP >65 and continued milrinone -[facility] transfer center has been contacted and plan to transfer when stable   Acute Hypoxic Respiratory Failure Secondary to the above PO2 and respiratory status stabilized on bipap -monitor closely in ICU, improved so hope to avoid intubation -  CKD stage III Creatinine near baseline -follow renal indices, electrolytes and UOP closely     Best Practice (right click and "Reselect all SmartList Selections" daily)   Diet/type: NPO DVT prophylaxis: systemic heparin GI prophylaxis: N/A Lines: yes and it is still needed Foley:  Yes, and it is still needed Code Status:  full code Last date of multidisciplinary goals  of care discussion [per primart]  Labs   CBC: Recent Labs  Lab 02/19/22 1002 02/19/22 1635 02/19/22 1636 02/19/22 1645  WBC 8.4  --   --   --   HGB 13.0 13.9 12.9* 10.5*  HCT 37.6* 41.0 38.0* 31.0*  MCV 81.2  --   --   --   PLT 222  --   --   --     Basic Metabolic Panel: Recent Labs  Lab 02/19/22 1002  02/19/22 1635 02/19/22 1636 02/19/22 1645  NA 131* 131* 137 144  K 4.8 3.9 3.2* 2.6*  CL 99  --   --   --   CO2 19*  --   --   --   GLUCOSE 121*  --   --   --   BUN 41*  --   --   --   CREATININE 2.58*  --   --   --   CALCIUM 9.2  --   --   --    GFR: Estimated Creatinine Clearance: 44.5 mL/min (A) (by C-G formula based on SCr of 2.58 mg/dL (H)). Recent Labs  Lab 02/19/22 1002 02/19/22 1406  WBC 8.4  --   LATICACIDVEN  --  2.4*    Liver Function Tests: Recent Labs  Lab 02/19/22 1002  AST 29  ALT 24  ALKPHOS 63  BILITOT 3.1*  PROT 6.6  ALBUMIN 3.3*   No results for input(s): "LIPASE", "AMYLASE" in the last 168 hours. No results for input(s): "AMMONIA" in the last 168 hours.  ABG    Component Value Date/Time   PHART 7.463 (H) 02/19/2022 1645   PCO2ART 19.0 (LL) 02/19/2022 1645   PO2ART 64 (L) 02/19/2022 1645   HCO3 13.8 (L) 02/19/2022 1645   TCO2 14 (L) 02/19/2022 1645   ACIDBASEDEF 8.0 (H) 02/19/2022 1645   O2SAT 95 02/19/2022 1645     Coagulation Profile: No results for input(s): "INR", "PROTIME" in the last 168 hours.  Cardiac Enzymes: No results for input(s): "CKTOTAL", "CKMB", "CKMBINDEX", "TROPONINI" in the last 168 hours.  HbA1C: Hgb A1c MFr Bld  Date/Time Value Ref Range Status  02/09/2022 06:24 AM 6.6 (H) 4.8 - 5.6 % Final    Comment:    (NOTE) Pre diabetes:          5.7%-6.4%  Diabetes:              >6.4%  Glycemic control for   <7.0% adults with diabetes   08/19/2021 02:51 PM 6.0 (H) 4.8 - 5.6 % Final    Comment:    (NOTE) Pre diabetes:          5.7%-6.4%  Diabetes:              >6.4%  Glycemic control for   <7.0% adults with diabetes     CBG: No results for input(s): "GLUCAP" in the last 168 hours.  Review of Systems:   Unable to obtain on bipap  Past Medical History:  He,  has a past medical history of Anxiety, CHF (congestive heart failure) ([place]), Chronic systolic heart failure ([place]), CKD (chronic kidney disease) stage  2, GFR 60-89 ml/min, Dyspnea, Essential hypertension, benign, Headache, History of pneumonia, Mitral regurgitation, Noncompliance, Nonischemic cardiomyopathy ([place]), Pneumonia (2011), and S/P aortic valve repair (05/11/2017).   Surgical History:   Past Surgical History:  Procedure Laterality Date   AORTIC VALVE REPAIR N/A 05/11/2017   Procedure: AORTIC VALVE REPAIR;  Surgeon: Rexene Alberts, MD;  Location: [place];  Service: Open Heart Surgery;  Laterality: N/A;   CARDIAC SURGERY     RIGHT/LEFT HEART CATH AND CORONARY ANGIOGRAPHY N/A 04/20/2017   Procedure: RIGHT/LEFT HEART CATH AND CORONARY ANGIOGRAPHY;  Surgeon: Jolaine Artist, MD;  Location: [place] CV LAB;  Service: Cardiovascular;  Laterality: N/A;   SURGERY SCROTAL / TESTICULAR     Testicular torsion   TEE WITHOUT CARDIOVERSION N/A 04/09/2017   Procedure: TRANSESOPHAGEAL ECHOCARDIOGRAM (TEE);  Surgeon: Jolaine Artist, MD;  Location: [facility] ENDOSCOPY;  Service: Cardiovascular;  Laterality: N/A;   TEE WITHOUT CARDIOVERSION N/A 05/11/2017   Procedure: TRANSESOPHAGEAL ECHOCARDIOGRAM (TEE);  Surgeon: Rexene Alberts, MD;  Location: [place];  Service: Open Heart Surgery;  Laterality: N/A;   TEE WITHOUT CARDIOVERSION N/A 01/20/2022   Procedure: TRANSESOPHAGEAL ECHOCARDIOGRAM (TEE);  Surgeon: Jolaine Artist, MD;  Location: [facility] ENDOSCOPY;  Service: Cardiovascular;  Laterality: N/A;     Social History:   reports that he has never smoked. He has never used smokeless tobacco. He reports current alcohol use. He reports that he does not use drugs.   Family History:  His family history includes Breast cancer in his mother; Hypertension in his father; Stroke in his father.   Allergies No Known Allergies   Home Medications  Prior to Admission medications   Medication Sig Start Date End Date Taking? Authorizing Provider  acetaminophen (TYLENOL) 500 MG tablet Take 2 tablets (1,000 mg total) by mouth every 6 (six) hours as needed for  moderate pain. 02/12/22  Yes Sharion Settler, DO  allopurinol (ZYLOPRIM) 300 MG tablet Take 1 tablet (300 mg total) by mouth daily. 06/12/21  Yes Sanjuana Kava, MD  ALPRAZolam Duanne Moron) 0.5 MG tablet Take 0.5 mg by mouth at bedtime. 09/18/20  Yes [provider]  amiodarone (PACERONE) 200 MG tablet Take 1 tablet (200 mg total) by mouth 2 (two) times daily for 10 days, THEN 1 tablet (200 mg total) daily. 02/12/22 03/24/22 Yes Espinoza, Alejandra, DO  milrinone (PRIMACOR) 20 MG/100 ML SOLN infusion Inject 0.0357 mg/min into the vein continuous. 02/12/22  Yes Espinoza, Dawson Bills, DO  polyethylene glycol powder (GLYCOLAX/MIRALAX) 17 GM/SCOOP powder Take 17 g by mouth daily. 02/13/22  Yes Espinoza, Dawson Bills, DO  potassium chloride SA (KLOR-CON M) 20 MEQ tablet Take 20 mEq by mouth daily. 01/13/22  Yes [provider]  senna-docusate (SENOKOT-S) 8.6-50 MG tablet Take 1 tablet by mouth at bedtime. 02/12/22  Yes Sharion Settler, DO  torsemide (DEMADEX) 20 MG tablet Take 3 tablets (60 mg total) by mouth daily. 02/13/22  Yes Sharion Settler, DO     Critical care time:  40 minutes    CRITICAL CARE Performed by: Otilio Carpen Jerlisa Diliberto   Total critical care time: 40 minutes  Critical care time was exclusive of separately billable procedures and treating other patients.  Critical care was necessary to treat or prevent imminent or life-threatening deterioration.  Critical care was time spent personally by me on the following activities: development of treatment plan with patient and/or surrogate as well as nursing, discussions with consultants, evaluation of patient's response to treatment, examination of patient, obtaining history from patient or surrogate, ordering and performing treatments and interventions, ordering and review of laboratory studies, ordering and review of radiographic studies, pulse oximetry and re-evaluation of patient's condition.   Otilio Carpen Caressa Scearce, PA-C [facility] See Amion for pager If no response to pager , please call [phone] until 7pm After 7:00 pm call Elink  [phone]

## 2022-02-19 NOTE — Progress Notes (Signed)
   Now on milrinone 0.5 mcg and had 80 mg IV lasix x 1.   450 cc dark urine.   Remains short of breath at rest. Sitting straight up in the bed.   Give 120 mg IV lasix now and 2.5 mg metolazone.   Check CO-OX now and lactic acid.   NPO for possible  IABP/swan today.   Dr Haroldine Laws at bedside.  Shyasia Funches NP-C  2:23 PM

## 2022-02-19 NOTE — Progress Notes (Signed)
Heart Failure Navigator Progress Note ? ?Assessed for Heart & Vascular TOC clinic readiness.  ?Patient does not meet criteria due to Advanced Heart Failure Team patient. .  ? ? ? ?Jazlyne Gauger, BSN, RN ?Heart Failure Nurse Navigator ?Secure Chat Only   ?

## 2022-02-19 NOTE — TOC Initial Note (Signed)
Transition of Care [facility]) - Initial/Assessment Note    Patient Details  Name: [name] [name] MRN: [id number] Date of Birth: Aug 27, 1980  Transition of [facility] CM/SW Contact:    Bethena Roys, RN Phone Number: 02/19/2022, 1:09 PM  Clinical Narrative: Patient presented for volume overload-previously at home on IV Milrinone. PTA patient is from home with support of spouse. Patient is currently active with [facility] RN services for IV Milrinone. Case Manager did make [facility] Liaison aware that patient has been hospitalized. Case Manager also notified Pam [facility] Liaison. Case Manager will continue to follow for disposition needs.             Expected Discharge Plan: [place] Barriers to Discharge: Continued Medical Work up  Expected Discharge Plan and Services Expected Discharge Plan: [place] In-house Referral: NA Discharge Planning Services: CM Consult Post Acute Care Choice: Home Health, Resumption of Svcs/PTA Provider Living arrangements for the past 2 months: [place]: [facility] Agency: [place] [facility]) Date HH Agency Contacted: 02/19/22 Time [place]: 27 Representative spoke with at [place]: Joanell Rising with [facility].  Prior Living Arrangements/Services Living arrangements for the past 2 months: Single Family Home Lives with:: Self, Spouse Patient language and need for interpreter reviewed:: Yes Do you feel safe going back to the place where you live?: Yes      Need for Family Participation in Patient Care: Yes (Comment) Care giver support system in place?: Yes (comment) Current home services: DME (IV Milrinone.) Criminal Activity/Legal Involvement Pertinent to Current Situation/Hospitalization: No - Comment as needed    Permission Sought/Granted Permission sought to share information with : Family Supports, Case Freight forwarder, Research scientist (medical) granted to share information with : Yes, Verbal Permission Granted     Emotional Assessment Appearance:: Appears stated age       Alcohol / Substance Use: Not Applicable Psych Involvement: No (comment)  Admission diagnosis:  Acute on chronic combined systolic and diastolic heart failure (HCC) [I50.43] Patient Active Problem List   Diagnosis Date Noted   Acute on chronic combined systolic and diastolic heart failure ([place]) 02/19/2022   Diabetes mellitus with complication ([place]) 44/31/5400   Cardiogenic shock ([place])    Heart failure with acute decompensation, type unknown ([place]) 02/09/2022   CHF exacerbation ([place]) 02/08/2022   Inguinal hernia 02/08/2022   Constipation 02/08/2022   Anasarca    Acute heart failure reduced EF  12/23/2021   S/P aortic valve repair 05/11/2017   Aortic valve regurgitation    Chronic systolic CHF (congestive heart failure) ([place])    Hypertensive emergency 05/30/2015   Essential hypertension 11/22/2014   Shift work sleep disorder 10/30/2014   Obstructive sleep apnea 10/30/2014   Hypertensive crisis 09/26/2014   Hypertensive urgency 06/13/2013   CHF (congestive heart failure) ([place]) 06/13/2013   AKI on CKD3 06/13/2013   Nonischemic cardiomyopathy ([place]) 06/13/2013   HTN (hypertension) 02/25/2012   Chronic combined systolic and diastolic heart failure ([place]) 01/08/2012   Elevated troponin 01/01/2012   Acute on chronic combined systolic and diastolic CHF (congestive heart failure) ([place]) 12/31/2011   Hypokalemia 12/31/2011   Chest pain 12/31/2011   AKI (acute kidney injury) ([place]) 12/31/2011   PCP:  Sharilyn Sites, MD Pharmacy:   [facility] [id number]  [place] [address] Phone: [phone] Fax: [place] [address] [place] [address] Phone: [phone] Fax: [phone]  Readmission Risk Interventions     No  data to display

## 2022-02-19 NOTE — Progress Notes (Signed)
ANTICOAGULATION CONSULT NOTE - Initial Consult  Pharmacy Consult for heparin Indication:  IABP  No Known Allergies  Patient Measurements: Weight: 97.1 kg (214 lb 1.1 oz) Heparin Dosing Weight: 95.4  Vital Signs: BP: 118/81 (08/10 1714) Pulse Rate: 0 (08/10 1714)  Labs: Recent Labs    02/19/22 1002 02/19/22 1635 02/19/22 1636 02/19/22 1645 02/19/22 1717  HGB 13.0   < > 12.9* 10.5* 13.9  HCT 37.6*   < > 38.0* 31.0* 41.0  PLT 222  --   --   --   --   CREATININE 2.58*  --   --   --   --    < > = values in this interval not displayed.    Estimated Creatinine Clearance: 44.5 mL/min (A) (by C-G formula based on SCr of 2.58 mg/dL (H)).   Medical History: Past Medical History:  Diagnosis Date   Anxiety    CHF (congestive heart failure) (HCC)    Chronic systolic heart failure (HCC)    CKD (chronic kidney disease) stage 2, GFR 60-89 ml/min    Dyspnea    with value issues- "if I dont take my medication"   Essential hypertension, benign    Headache    History of pneumonia    Mitral regurgitation    Moderate   Noncompliance    Nonischemic cardiomyopathy ([place])    Normal coronaries May 2012, LVEF < 20%   Pneumonia 2011   S/P aortic valve repair 05/11/2017   Complex valvuloplasty including plication of left coronary leaflet and 49m Biostable HAART ring annuloplasty    Medications:  Scheduled:   sodium chloride flush  3 mL Intravenous Q12H   sodium chloride flush  3 mL Intravenous Q12H    Assessment: 41y/o male presented to cath lab for IABP placement. Pharmacy consulted to dose heparin for bridging [facility] C[facility] is [facility]. No signs of bleeding noted.  Goal of Therapy:  Heparin level 0.2-0.5 units/ml Monitor platelets by anticoagulation protocol: Yes   Plan:  Start heparin infusion at 1100 units/hr Check 6hr heparin level and CBC  Monitor for signs and symptoms of bleeding.  ELouanne Belton PharmD, [id number] Pharmacy  Resident 02/19/2022 6:41 PM

## 2022-02-20 ENCOUNTER — Inpatient Hospital Stay (HOSPITAL_COMMUNITY): Payer: BC Managed Care – PPO

## 2022-02-20 ENCOUNTER — Ambulatory Visit: Payer: Self-pay | Admitting: *Deleted

## 2022-02-20 ENCOUNTER — Encounter (HOSPITAL_COMMUNITY): Payer: Self-pay | Admitting: Internal Medicine

## 2022-02-20 DIAGNOSIS — I351 Nonrheumatic aortic (valve) insufficiency: Secondary | ICD-10-CM | POA: Diagnosis not present

## 2022-02-20 DIAGNOSIS — G4733 Obstructive sleep apnea (adult) (pediatric): Secondary | ICD-10-CM | POA: Diagnosis not present

## 2022-02-20 DIAGNOSIS — J90 Pleural effusion, not elsewhere classified: Secondary | ICD-10-CM | POA: Diagnosis not present

## 2022-02-20 DIAGNOSIS — D6489 Other specified anemias: Secondary | ICD-10-CM | POA: Diagnosis not present

## 2022-02-20 DIAGNOSIS — E871 Hypo-osmolality and hyponatremia: Secondary | ICD-10-CM | POA: Diagnosis not present

## 2022-02-20 DIAGNOSIS — Z4509 Encounter for adjustment and management of other cardiac device: Secondary | ICD-10-CM | POA: Diagnosis not present

## 2022-02-20 DIAGNOSIS — N179 Acute kidney failure, unspecified: Secondary | ICD-10-CM | POA: Diagnosis not present

## 2022-02-20 DIAGNOSIS — Z4682 Encounter for fitting and adjustment of non-vascular catheter: Secondary | ICD-10-CM | POA: Diagnosis not present

## 2022-02-20 DIAGNOSIS — Z952 Presence of prosthetic heart valve: Secondary | ICD-10-CM | POA: Diagnosis not present

## 2022-02-20 DIAGNOSIS — I5043 Acute on chronic combined systolic (congestive) and diastolic (congestive) heart failure: Secondary | ICD-10-CM | POA: Diagnosis not present

## 2022-02-20 DIAGNOSIS — I129 Hypertensive chronic kidney disease with stage 1 through stage 4 chronic kidney disease, or unspecified chronic kidney disease: Secondary | ICD-10-CM | POA: Diagnosis not present

## 2022-02-20 DIAGNOSIS — J9811 Atelectasis: Secondary | ICD-10-CM | POA: Diagnosis not present

## 2022-02-20 DIAGNOSIS — I517 Cardiomegaly: Secondary | ICD-10-CM | POA: Diagnosis not present

## 2022-02-20 DIAGNOSIS — I110 Hypertensive heart disease with heart failure: Secondary | ICD-10-CM | POA: Diagnosis not present

## 2022-02-20 DIAGNOSIS — E872 Acidosis, unspecified: Secondary | ICD-10-CM | POA: Diagnosis not present

## 2022-02-20 DIAGNOSIS — K5900 Constipation, unspecified: Secondary | ICD-10-CM | POA: Diagnosis not present

## 2022-02-20 DIAGNOSIS — N183 Chronic kidney disease, stage 3 unspecified: Secondary | ICD-10-CM | POA: Diagnosis not present

## 2022-02-20 DIAGNOSIS — I482 Chronic atrial fibrillation, unspecified: Secondary | ICD-10-CM | POA: Diagnosis not present

## 2022-02-20 DIAGNOSIS — J939 Pneumothorax, unspecified: Secondary | ICD-10-CM | POA: Diagnosis not present

## 2022-02-20 DIAGNOSIS — R918 Other nonspecific abnormal finding of lung field: Secondary | ICD-10-CM | POA: Diagnosis not present

## 2022-02-20 DIAGNOSIS — I4892 Unspecified atrial flutter: Secondary | ICD-10-CM | POA: Diagnosis not present

## 2022-02-20 DIAGNOSIS — E1122 Type 2 diabetes mellitus with diabetic chronic kidney disease: Secondary | ICD-10-CM | POA: Diagnosis not present

## 2022-02-20 DIAGNOSIS — Z452 Encounter for adjustment and management of vascular access device: Secondary | ICD-10-CM | POA: Diagnosis not present

## 2022-02-20 DIAGNOSIS — J948 Other specified pleural conditions: Secondary | ICD-10-CM | POA: Diagnosis not present

## 2022-02-20 DIAGNOSIS — I5082 Biventricular heart failure: Secondary | ICD-10-CM | POA: Diagnosis not present

## 2022-02-20 DIAGNOSIS — I428 Other cardiomyopathies: Secondary | ICD-10-CM | POA: Diagnosis not present

## 2022-02-20 DIAGNOSIS — J811 Chronic pulmonary edema: Secondary | ICD-10-CM | POA: Diagnosis not present

## 2022-02-20 DIAGNOSIS — J9600 Acute respiratory failure, unspecified whether with hypoxia or hypercapnia: Secondary | ICD-10-CM | POA: Diagnosis not present

## 2022-02-20 DIAGNOSIS — G4730 Sleep apnea, unspecified: Secondary | ICD-10-CM | POA: Diagnosis not present

## 2022-02-20 DIAGNOSIS — Z01818 Encounter for other preprocedural examination: Secondary | ICD-10-CM | POA: Diagnosis not present

## 2022-02-20 DIAGNOSIS — R570 Cardiogenic shock: Secondary | ICD-10-CM | POA: Diagnosis not present

## 2022-02-20 DIAGNOSIS — J9601 Acute respiratory failure with hypoxia: Secondary | ICD-10-CM | POA: Diagnosis not present

## 2022-02-20 DIAGNOSIS — Z95828 Presence of other vascular implants and grafts: Secondary | ICD-10-CM | POA: Diagnosis not present

## 2022-02-20 DIAGNOSIS — Z9889 Other specified postprocedural states: Secondary | ICD-10-CM | POA: Diagnosis not present

## 2022-02-20 DIAGNOSIS — I509 Heart failure, unspecified: Secondary | ICD-10-CM | POA: Diagnosis not present

## 2022-02-20 LAB — MAGNESIUM: Magnesium: 2.3 mg/dL (ref 1.7–2.4)

## 2022-02-20 LAB — HEPARIN LEVEL (UNFRACTIONATED)
Heparin Unfractionated: 0.15 IU/mL — ABNORMAL LOW (ref 0.30–0.70)
Heparin Unfractionated: 0.1 IU/mL — ABNORMAL LOW (ref 0.30–0.70)

## 2022-02-20 LAB — POCT I-STAT 7, (LYTES, BLD GAS, ICA,H+H)
Acid-base deficit: 1 mmol/L (ref 0.0–2.0)
Acid-base deficit: 2 mmol/L (ref 0.0–2.0)
Bicarbonate: 20 mmol/L (ref 20.0–28.0)
Bicarbonate: 22.6 mmol/L (ref 20.0–28.0)
Calcium, Ion: 1.08 mmol/L — ABNORMAL LOW (ref 1.15–1.40)
Calcium, Ion: 1.13 mmol/L — ABNORMAL LOW (ref 1.15–1.40)
HCT: 38 % — ABNORMAL LOW (ref 39.0–52.0)
HCT: 44 % (ref 39.0–52.0)
Hemoglobin: 12.9 g/dL — ABNORMAL LOW (ref 13.0–17.0)
Hemoglobin: 15 g/dL (ref 13.0–17.0)
O2 Saturation: 100 %
O2 Saturation: 99 %
Patient temperature: 37
Patient temperature: 37
Potassium: 3.8 mmol/L (ref 3.5–5.1)
Potassium: 3.9 mmol/L (ref 3.5–5.1)
Sodium: 129 mmol/L — ABNORMAL LOW (ref 135–145)
Sodium: 129 mmol/L — ABNORMAL LOW (ref 135–145)
TCO2: 21 mmol/L — ABNORMAL LOW (ref 22–32)
TCO2: 24 mmol/L (ref 22–32)
pCO2 arterial: 27 mmHg — ABNORMAL LOW (ref 32–48)
pCO2 arterial: 35.2 mmHg (ref 32–48)
pH, Arterial: 7.415 (ref 7.35–7.45)
pH, Arterial: 7.478 — ABNORMAL HIGH (ref 7.35–7.45)
pO2, Arterial: 105 mmHg (ref 83–108)
pO2, Arterial: 170 mmHg — ABNORMAL HIGH (ref 83–108)

## 2022-02-20 LAB — CBC
HCT: 35.2 % — ABNORMAL LOW (ref 39.0–52.0)
Hemoglobin: 12.1 g/dL — ABNORMAL LOW (ref 13.0–17.0)
MCH: 27.8 pg (ref 26.0–34.0)
MCHC: 34.4 g/dL (ref 30.0–36.0)
MCV: 80.9 fL (ref 80.0–100.0)
Platelets: 217 10*3/uL (ref 150–400)
RBC: 4.35 MIL/uL (ref 4.22–5.81)
RDW: 16.5 % — ABNORMAL HIGH (ref 11.5–15.5)
WBC: 9.2 10*3/uL (ref 4.0–10.5)
nRBC: 0.2 % (ref 0.0–0.2)

## 2022-02-20 LAB — BASIC METABOLIC PANEL
Anion gap: 14 (ref 5–15)
Anion gap: 14 (ref 5–15)
BUN: 41 mg/dL — ABNORMAL HIGH (ref 6–20)
BUN: 41 mg/dL — ABNORMAL HIGH (ref 6–20)
CO2: 20 mmol/L — ABNORMAL LOW (ref 22–32)
CO2: 22 mmol/L (ref 22–32)
Calcium: 8.7 mg/dL — ABNORMAL LOW (ref 8.9–10.3)
Calcium: 8.7 mg/dL — ABNORMAL LOW (ref 8.9–10.3)
Chloride: 95 mmol/L — ABNORMAL LOW (ref 98–111)
Chloride: 95 mmol/L — ABNORMAL LOW (ref 98–111)
Creatinine, Ser: 2.64 mg/dL — ABNORMAL HIGH (ref 0.61–1.24)
Creatinine, Ser: 2.76 mg/dL — ABNORMAL HIGH (ref 0.61–1.24)
GFR, Estimated: 30 mL/min — ABNORMAL LOW (ref >60)
GFR, Estimated: 29 mL/min — ABNORMAL LOW (ref >60)
Glucose, Bld: 169 mg/dL — ABNORMAL HIGH (ref 70–99)
Glucose, Bld: 170 mg/dL — ABNORMAL HIGH (ref 70–99)
Potassium: 3.9 mmol/L (ref 3.5–5.1)
Potassium: 3.7 mmol/L (ref 3.5–5.1)
Sodium: 129 mmol/L — ABNORMAL LOW (ref 135–145)
Sodium: 131 mmol/L — ABNORMAL LOW (ref 135–145)

## 2022-02-20 LAB — COOXEMETRY PANEL
Carboxyhemoglobin: 1.5 % (ref 0.5–1.5)
Methemoglobin: <0.7 % (ref 0.0–1.5)
O2 Saturation: 70.4 %
Total hemoglobin: 12 g/dL (ref 12.0–16.0)

## 2022-02-20 MED ORDER — ROCURONIUM BROMIDE 10 MG/ML (PF) SYRINGE: 100.0000 mg | PREFILLED_SYRINGE | Freq: Once | INTRAVENOUS | Status: AC

## 2022-02-20 MED ORDER — MIDAZOLAM HCL 2 MG/2ML IJ SOLN
4.0000 mg | Freq: Once | INTRAMUSCULAR | Status: AC
Start: 2022-02-20 — End: 2022-02-20

## 2022-02-20 MED ORDER — NOREPINEPHRINE 16 MG/250ML-% IV SOLN: 0.0000 ug/min | INTRAVENOUS | Status: DC

## 2022-02-20 MED ORDER — MILRINONE LACTATE IN DEXTROSE 20-5 MG/100ML-% IV SOLN: 0.5000 ug/kg/min | INTRAVENOUS | Status: DC

## 2022-02-20 MED ORDER — FUROSEMIDE 10 MG/ML IJ SOLN
10.0000 mg/h | INTRAVENOUS | Status: DC
  Filled 2022-02-20: qty 20

## 2022-02-20 MED ORDER — SUCCINYLCHOLINE CHLORIDE 200 MG/10ML IV SOSY
PREFILLED_SYRINGE | INTRAVENOUS | Status: AC
  Filled 2022-02-20: qty 10

## 2022-02-20 MED ORDER — FUROSEMIDE 10 MG/ML IJ SOLN: 10.0000 mg/h | INTRAVENOUS | Status: DC

## 2022-02-20 MED ORDER — MIDAZOLAM HCL 2 MG/2ML IJ SOLN
INTRAMUSCULAR | Status: AC
  Filled 2022-02-20: qty 2

## 2022-02-20 MED ORDER — ETOMIDATE 2 MG/ML IV SOLN
INTRAVENOUS | Status: AC
  Administered 2022-02-20: 20 mg via INTRAVENOUS
  Filled 2022-02-20: qty 20

## 2022-02-20 MED ORDER — PANTOPRAZOLE 2 MG/ML SUSPENSION
40.0000 mg | Freq: Every day | ORAL | Status: DC
  Administered 2022-02-20: 40 mg
  Filled 2022-02-20: qty 20

## 2022-02-20 MED ORDER — PROPOFOL 1000 MG/100ML IV EMUL: 0.0000 ug/kg/min | INTRAVENOUS | Status: DC

## 2022-02-20 MED ORDER — AMIODARONE HCL IN DEXTROSE 360-4.14 MG/200ML-% IV SOLN: 60.0000 mg/h | INTRAVENOUS | Status: DC

## 2022-02-20 MED ORDER — KETAMINE HCL 50 MG/5ML IJ SOSY
PREFILLED_SYRINGE | INTRAMUSCULAR | Status: AC
  Filled 2022-02-20: qty 5

## 2022-02-20 MED ORDER — FENTANYL CITRATE PF 50 MCG/ML IJ SOSY
PREFILLED_SYRINGE | INTRAMUSCULAR | Status: AC
  Administered 2022-02-20: 100 ug via INTRAVENOUS
  Filled 2022-02-20: qty 2

## 2022-02-20 MED ORDER — LORAZEPAM 2 MG/ML IJ SOLN
INTRAMUSCULAR | Status: AC
  Filled 2022-02-20: qty 1

## 2022-02-20 MED ORDER — ROCURONIUM BROMIDE 10 MG/ML (PF) SYRINGE
PREFILLED_SYRINGE | INTRAVENOUS | Status: AC
  Administered 2022-02-20: 100 mg via INTRAVENOUS
  Filled 2022-02-20: qty 10

## 2022-02-20 MED ORDER — DOCUSATE SODIUM 50 MG/5ML PO LIQD: 100.0000 mg | Freq: Two times a day (BID) | ORAL | 0 refills | Status: DC

## 2022-02-20 MED ORDER — FENTANYL CITRATE PF 50 MCG/ML IJ SOSY: 50.0000 ug | PREFILLED_SYRINGE | Freq: Once | INTRAMUSCULAR | Status: DC

## 2022-02-20 MED ORDER — AMIODARONE HCL IN DEXTROSE 360-4.14 MG/200ML-% IV SOLN: 30.0000 mg/h | INTRAVENOUS | Status: DC

## 2022-02-20 MED ORDER — PROPOFOL 1000 MG/100ML IV EMUL
0.0000 ug/kg/min | INTRAVENOUS | Status: DC
  Administered 2022-02-20: 35 ug/kg/min via INTRAVENOUS
  Administered 2022-02-20: 5 ug/kg/min via INTRAVENOUS
  Administered 2022-02-20: 35 ug/kg/min via INTRAVENOUS
  Filled 2022-02-20 (×4): qty 100

## 2022-02-20 MED ORDER — LORAZEPAM 2 MG/ML IJ SOLN
0.5000 mg | Freq: Once | INTRAMUSCULAR | Status: AC
  Administered 2022-02-20: 0.5 mg via INTRAVENOUS

## 2022-02-20 MED ORDER — MIDAZOLAM HCL 2 MG/2ML IJ SOLN
INTRAMUSCULAR | Status: AC
  Administered 2022-02-20: 4 mg via INTRAVENOUS
  Filled 2022-02-20: qty 2

## 2022-02-20 MED ORDER — POTASSIUM CHLORIDE 20 MEQ PO PACK
20.0000 meq | PACK | Freq: Once | ORAL | Status: AC
  Administered 2022-02-20: 20 meq
  Filled 2022-02-20: qty 1

## 2022-02-20 MED ORDER — SODIUM BICARBONATE 8.4 % IV SOLN
INTRAVENOUS | Status: AC
  Administered 2022-02-20: 50 meq via INTRAVENOUS
  Filled 2022-02-20: qty 50

## 2022-02-20 MED ORDER — SODIUM BICARBONATE 8.4 % IV SOLN: 50.0000 meq | Freq: Once | INTRAVENOUS | Status: AC

## 2022-02-20 MED ORDER — HEPARIN (PORCINE) 25000 UT/250ML-% IV SOLN: 1200.0000 [IU]/h | INTRAVENOUS | Status: DC

## 2022-02-20 MED ORDER — FENTANYL CITRATE PF 50 MCG/ML IJ SOSY: 100.0000 ug | PREFILLED_SYRINGE | Freq: Once | INTRAMUSCULAR | Status: AC

## 2022-02-20 MED ORDER — DOCUSATE SODIUM 50 MG/5ML PO LIQD
100.0000 mg | Freq: Two times a day (BID) | ORAL | Status: DC
  Administered 2022-02-20: 100 mg
  Filled 2022-02-20: qty 10

## 2022-02-20 MED ORDER — FENTANYL 2500MCG IN NS 250ML (10MCG/ML) PREMIX INFUSION
50.0000 ug/h | INTRAVENOUS | Status: DC
  Administered 2022-02-20: 50 ug/h via INTRAVENOUS
  Filled 2022-02-20: qty 250

## 2022-02-20 MED ORDER — FENTANYL BOLUS VIA INFUSION: 50.0000 ug | INTRAVENOUS | Status: DC | PRN

## 2022-02-20 MED ORDER — ONDANSETRON HCL 4 MG/2ML IJ SOLN: 4.0000 mg | Freq: Four times a day (QID) | INTRAMUSCULAR | 0 refills | Status: DC | PRN

## 2022-02-20 MED ORDER — POLYETHYLENE GLYCOL 3350 17 G PO PACK
17.0000 g | PACK | Freq: Every day | ORAL | Status: DC
Start: 2022-02-20 — End: 2022-02-20

## 2022-02-20 MED ORDER — NOREPINEPHRINE 16 MG/250ML-% IV SOLN
0.0000 ug/min | INTRAVENOUS | Status: DC
  Administered 2022-02-20: 3 ug/min via INTRAVENOUS
  Filled 2022-02-20: qty 250

## 2022-02-20 MED ORDER — POTASSIUM CHLORIDE CRYS ER 20 MEQ PO TBCR: 20.0000 meq | EXTENDED_RELEASE_TABLET | Freq: Once | ORAL | Status: DC

## 2022-02-20 MED ORDER — CHLORHEXIDINE GLUCONATE CLOTH 2 % EX PADS
6.0000 | MEDICATED_PAD | Freq: Every day | CUTANEOUS | Status: DC
Start: 2022-02-20 — End: 2022-02-20
  Administered 2022-02-20: 6 via TOPICAL

## 2022-02-20 MED ORDER — ETOMIDATE 2 MG/ML IV SOLN: 20.0000 mg | Freq: Once | INTRAVENOUS | Status: AC

## 2022-02-20 MED ORDER — POTASSIUM CHLORIDE 20 MEQ PO PACK
40.0000 meq | PACK | Freq: Once | ORAL | Status: AC
Start: 2022-02-20 — End: 2022-02-20
  Administered 2022-02-20: 40 meq
  Filled 2022-02-20: qty 2

## 2022-02-20 MED ORDER — PANTOPRAZOLE SODIUM 40 MG PO PACK: 40.0000 mg | PACK | Freq: Every day | ORAL | Status: DC

## 2022-02-20 NOTE — Progress Notes (Signed)
NAME:  VINEETH FELL, MRN:  [id number], DOB:  [birth year], LOS: 1 ADMISSION DATE:  02/19/2022, CONSULTATION DATE:  02/20/22 REFERRING MD:  Haroldine Laws, CHIEF COMPLAINT:  acute on chronic HF   History of Present Illness:  [name] [name] is a 41 y.o. M with PMH significant for NICM, NYHA IV biventricular HF being evaluated at [facility] for transplant, CKD stage II-III, severe AI s/p AVR 2018 who presented the HF clinic with acutely worsening fatigue, cough, dyspnea at rest and orthopnea.  Stated he had been taking all home medications including milrinone and torsemide, no fever or chills, no weight gain.   He was volume overloaded on admission and admitted to the advanced heart failure team, given Lasix '80mg'$  x1 and Milrinone increased from 3.'75mg'$  to 0.'5mg'$ .  Dyspnea persisted and was given additional Lasix '120mg'$  and Metolazone 2.'5mg'$  and taken to the cath lab for swan ganz and IABP.  Post op had worsening respiratory distress and was started on Bipap and PCCM consulted.    Pertinent  Medical History   has a past medical history of Anxiety, CHF (congestive heart failure) ([place]), Chronic systolic heart failure ([place]), CKD (chronic kidney disease) stage 2, GFR 60-89 ml/min, Dyspnea, Essential hypertension, benign, Headache, History of pneumonia, Mitral regurgitation, Noncompliance, Nonischemic cardiomyopathy ([place]), Pneumonia (2011), and S/P aortic valve repair (05/11/2017).   Significant Hospital Events: Including procedures, antibiotic start and stop dates in addition to other pertinent events   8/10 presented to HF clinic with approximately one week of worsening dyspnea and fatigue, admitted to HF for diuresis, taken to cath lab for [facility] and IABP, PCCM consult for worsening respiratory status 8/11 worsening respiratory status overnight and required intubation, has a bed at [facility] awaiting transfer  Interim History / Subjective:  Unfortunately respiratory status worsened overnight and he required  intubation  Objective   Blood pressure (!) 98/59, pulse 81, temperature 98.2 F (36.8 C), resp. rate 20, weight 95.3 kg, SpO2 98 %. PAP: (37-63)/(25-47) 47/30 CVP:  [7 mmHg-35 mmHg] 9 mmHg PCWP:  [30 mmHg] 30 mmHg CO:  [4 L/min-5.6 L/min] 4.6 L/min CI:  [1.9 L/min/m2-2.6 L/min/m2] 2.1 L/min/m2  Vent Mode: PRVC FiO2 (%):  [40 %-100 %] 60 % Set Rate:  [12 bmp-20 bmp] 20 bmp Vt Set:  [580 mL] 580 mL PEEP:  [5 cmH20] 5 cmH20 Plateau Pressure:  [22 cmH20-23 cmH20] 23 cmH20   Intake/Output Summary (Last 24 hours) at 02/20/2022 1006 Last data filed at 02/20/2022 1000 Gross per 24 hour  Intake 1542.57 ml  Output 6875 ml  Net -5332.43 ml    Filed Weights   02/19/22 1124 02/20/22 0600  Weight: 97.1 kg 95.3 kg    General:  critically ill M intubated and sedated HEENT: MM pink/moist, ETT in place Neuro: RASS-4 on propofol and Fentanyl CV: s1s2 rrr, no m/r/g PULM:  mechanically ventilated without rhonchi or wheezing, PRVC with vent pressures at goal GI: soft, non-distended  Extremities: warm/dry, 1+ edema  Skin: no rashes or lesions   Resolved Hospital Problem list     Assessment & Plan:   Acute on Chronic Biventricular HF  Severe IA s/p repair 2018  Diagnosed 2012 initially, most recently admitted last month and discharged on Milrinone and amiodarone taper -management per advanced HF team, plan for supportive care with balloon pump, swan, lasix gtt, low dose Levophed as needed to maintain MAP >65 and continued milrinone -Pending transfer to [facility]  Acute Hypoxic Respiratory Failure Secondary to the above ABG 7.4/35/170/22 PO2 and respiratory status initially  improved on Bipap, however worsened overnight and required intubation -FiO2 decreased from 70 % to 50% this AM -PAD with propofol and fentanyl --Maintain full vent support with SAT/SBT as tolerated -titrate Vent setting to maintain SpO2 greater than or equal to 90%. -HOB elevated 30 degrees. -Plateau pressures less  than 30 cm H20.  -Follow chest x-ray, ABG prn.   -Bronchial hygiene and RT/bronchodilator protocol.   Atrial Fibrillation with RVR -developed overnight, placed on amiodarone gtt   CKD stage III Creatinine near baseline initially, however up-trending from 2.3 to 2.7 today -Lasix gtt held by HF team -follow renal indices, electrolytes and UOP closely     Best Practice (right click and "Reselect all SmartList Selections" daily)   Diet/type: NPO DVT prophylaxis: systemic heparin GI prophylaxis: N/A Lines: yes and it is still needed Foley:  Yes, and it is still needed Code Status:  full code Last date of multidisciplinary goals of care discussion [per primary]  Labs   CBC: Recent Labs  Lab 02/19/22 1002 02/19/22 1635 02/19/22 1717 02/19/22 2011 02/20/22 0006 02/20/22 0500 02/20/22 0535  WBC 8.4  --   --  6.8  --  9.2  --   NEUTROABS  --   --   --  5.0  --   --   --   HGB 13.0   < > 13.9 12.3* 12.9* 12.1* 15.0  HCT 37.6*   < > 41.0 36.4* 38.0* 35.2* 44.0  MCV 81.2  --   --  81.4  --  80.9  --   PLT 222  --   --  222  --  217  --    < > = values in this interval not displayed.     Basic Metabolic Panel: Recent Labs  Lab 02/19/22 1002 02/19/22 1635 02/19/22 1717 02/19/22 2011 02/20/22 0006 02/20/22 0500 02/20/22 0535  NA 131*   < > 130* 131* 129* 129* 129*  K 4.8   < > 3.5 3.9 3.9 3.9 3.8  CL 99  --   --  96*  --  95*  --   CO2 19*  --   --  22  --  20*  --   GLUCOSE 121*  --   --  146*  --  169*  --   BUN 41*  --   --  40*  --  41*  --   CREATININE 2.58*  --   --  2.37*  --  2.64*  --   CALCIUM 9.2  --   --  8.9  --  8.7*  --   MG  --   --   --  1.9  --  2.3  --    < > = values in this interval not displayed.    GFR: Estimated Creatinine Clearance: 43.1 mL/min (A) (by C-G formula based on SCr of 2.64 mg/dL (H)). Recent Labs  Lab 02/19/22 1002 02/19/22 1406 02/19/22 1839 02/19/22 2011 02/20/22 0500  WBC 8.4  --   --  6.8 9.2  LATICACIDVEN  --   2.4* 1.8  --   --      Liver Function Tests: Recent Labs  Lab 02/19/22 1002 02/19/22 2011  AST 29 28  ALT 24 24  ALKPHOS 63 65  BILITOT 3.1* 2.9*  PROT 6.6 6.2*  ALBUMIN 3.3* 3.3*    No results for input(s): "LIPASE", "AMYLASE" in the last 168 hours. No results for input(s): "AMMONIA" in the last 168 hours.  ABG  Component Value Date/Time   PHART 7.415 02/20/2022 0535   PCO2ART 35.2 02/20/2022 0535   PO2ART 170 (H) 02/20/2022 0535   HCO3 22.6 02/20/2022 0535   TCO2 24 02/20/2022 0535   ACIDBASEDEF 1.0 02/20/2022 0535   O2SAT 70.4 02/20/2022 0540     Coagulation Profile: No results for input(s): "INR", "PROTIME" in the last 168 hours.  Cardiac Enzymes: No results for input(s): "CKTOTAL", "CKMB", "CKMBINDEX", "TROPONINI" in the last 168 hours.  HbA1C: Hgb A1c MFr Bld  Date/Time Value Ref Range Status  02/09/2022 06:24 AM 6.6 (H) 4.8 - 5.6 % Final    Comment:    (NOTE) Pre diabetes:          5.7%-6.4%  Diabetes:              >6.4%  Glycemic control for   <7.0% adults with diabetes   08/19/2021 02:51 PM 6.0 (H) 4.8 - 5.6 % Final    Comment:    (NOTE) Pre diabetes:          5.7%-6.4%  Diabetes:              >6.4%  Glycemic control for   <7.0% adults with diabetes     CBG: No results for input(s): "GLUCAP" in the last 168 hours.  Review of Systems:   Unable to obtain on bipap  Past Medical History:  He,  has a past medical history of Anxiety, CHF (congestive heart failure) ([place]), Chronic systolic heart failure ([place]), CKD (chronic kidney disease) stage 2, GFR 60-89 ml/min, Dyspnea, Essential hypertension, benign, Headache, History of pneumonia, Mitral regurgitation, Noncompliance, Nonischemic cardiomyopathy ([place]), Pneumonia (2011), and S/P aortic valve repair (05/11/2017).   Surgical History:   Past Surgical History:  Procedure Laterality Date   AORTIC VALVE REPAIR N/A 05/11/2017   Procedure: AORTIC VALVE REPAIR;  Surgeon: Rexene Alberts, MD;   Location: [place];  Service: Open Heart Surgery;  Laterality: N/A;   CARDIAC SURGERY     IABP INSERTION N/A 02/19/2022   Procedure: IABP Insertion;  Surgeon: Jolaine Artist, MD;  Location: [place] CV [facility];  Service: Cardiovascular;  Laterality: N/A;   RIGHT HEART CATH N/A 02/19/2022   Procedure: RIGHT HEART CATH;  Surgeon: Jolaine Artist, MD;  Location: [place] [facility];  Service: Cardiovascular;  Laterality: N/A;   RIGHT/LEFT HEART CATH AND CORONARY ANGIOGRAPHY N/A 04/20/2017   Procedure: RIGHT/LEFT HEART CATH AND CORONARY ANGIOGRAPHY;  Surgeon: Jolaine Artist, MD;  Location: [place] [facility];  Service: Cardiovascular;  Laterality: N/A;   SURGERY SCROTAL / TESTICULAR     Testicular torsion   TEE WITHOUT CARDIOVERSION N/A 04/09/2017   Procedure: TRANSESOPHAGEAL ECHOCARDIOGRAM (TEE);  Surgeon: Jolaine Artist, MD;  Location: [facility] ENDOSCOPY;  Service: Cardiovascular;  Laterality: N/A;   TEE WITHOUT CARDIOVERSION N/A 05/11/2017   Procedure: TRANSESOPHAGEAL ECHOCARDIOGRAM (TEE);  Surgeon: Rexene Alberts, MD;  Location: [place];  Service: Open Heart Surgery;  Laterality: N/A;   TEE WITHOUT CARDIOVERSION N/A 01/20/2022   Procedure: TRANSESOPHAGEAL ECHOCARDIOGRAM (TEE);  Surgeon: Jolaine Artist, MD;  Location: [facility] ENDOSCOPY;  Service: Cardiovascular;  Laterality: N/A;     Social History:   reports that he has never smoked. He has never used smokeless tobacco. He reports current alcohol use. He reports that he does not use drugs.   Family History:  His family history includes Breast cancer in his mother; Hypertension in his father; Stroke in his father.   Allergies No Known Allergies   Home  Medications  Prior to Admission medications   Medication Sig Start Date End Date Taking? Authorizing Provider  acetaminophen (TYLENOL) 500 MG tablet Take 2 tablets (1,000 mg total) by mouth every 6 (six) hours as needed for moderate pain. 02/12/22  Yes Sharion Settler, DO  allopurinol  (ZYLOPRIM) 300 MG tablet Take 1 tablet (300 mg total) by mouth daily. 06/12/21  Yes Sanjuana Kava, MD  ALPRAZolam Duanne Moron) 0.5 MG tablet Take 0.5 mg by mouth at bedtime. 09/18/20  Yes [provider]  amiodarone (PACERONE) 200 MG tablet Take 1 tablet (200 mg total) by mouth 2 (two) times daily for 10 days, THEN 1 tablet (200 mg total) daily. 02/12/22 03/24/22 Yes Espinoza, Alejandra, DO  milrinone (PRIMACOR) 20 MG/100 ML SOLN infusion Inject 0.0357 mg/min into the vein continuous. 02/12/22  Yes Espinoza, Dawson Bills, DO  polyethylene glycol powder (GLYCOLAX/MIRALAX) 17 GM/SCOOP powder Take 17 g by mouth daily. 02/13/22  Yes Espinoza, Dawson Bills, DO  potassium chloride SA (KLOR-CON M) 20 MEQ tablet Take 20 mEq by mouth daily. 01/13/22  Yes [provider]  senna-docusate (SENOKOT-S) 8.6-50 MG tablet Take 1 tablet by mouth at bedtime. 02/12/22  Yes Sharion Settler, DO  torsemide (DEMADEX) 20 MG tablet Take 3 tablets (60 mg total) by mouth daily. 02/13/22  Yes Sharion Settler, DO     Critical care time:  32 minutes    CRITICAL CARE Performed by: Otilio Carpen Caio Devera   Total critical care time: 32 minutes  Critical care time was exclusive of separately billable procedures and treating other patients.  Critical care was necessary to treat or prevent imminent or life-threatening deterioration.  Critical care was time spent personally by me on the following activities: development of treatment plan with patient and/or surrogate as well as nursing, discussions with consultants, evaluation of patient's response to treatment, examination of patient, obtaining history from patient or surrogate, ordering and performing treatments and interventions, ordering and review of laboratory studies, ordering and review of radiographic studies, pulse oximetry and re-evaluation of patient's condition.   Otilio Carpen Tramell Piechota, PA-C [facility] See Amion for pager If no response to pager ,  please call [phone] until 7pm After 7:00 pm call Elink  [phone]?[place]

## 2022-02-20 NOTE — Progress Notes (Signed)
Remains intubated. On IABP, Norepi up to 14 mcg + milrinone 0.5 mcg.   Afternoon BMET reviewed.  Creatinine trending up 2.76. CVP 9-10  Making 200 cc urine per hour.   Discussed with Dr Haroldine Laws. Hold lasix drip. Give 20 meq potassium.   Await transfer to [facility].   Detavious Rinn NP-C  1:45 PM

## 2022-02-20 NOTE — Patient Outreach (Signed)
[facility]) Care Management  02/20/2022  [name] [name] Nov 12, 1980 [id number]   Patient scheduled for initial care coordination outreach today however noted that he is currently admitted to hospital.  Will have Care Guide rescheduled for next week.  Valente David, RN, MSN, [facility] Care Coordinator [phone]

## 2022-02-20 NOTE — Procedures (Signed)
Intubation Procedure Note  ISHMEAL RORIE  [id number]  1980-08-25  Date:02/20/22  Time:4:36 AM   Provider Performing:Sarrah Fiorenza B Hollynn Garno    Procedure: Intubation (31500)  Indication(s) Respiratory Failure  Consent Risks of the procedure as well as the alternatives and risks of each were explained to the patient and/or caregiver.  Consent for the procedure was obtained and is signed in the bedside chart   Anesthesia Etomidate, Versed, Fentanyl, and Rocuronium   Time Out Verified patient identification, verified procedure, site/side was marked, verified correct patient position, special equipment/implants available, medications/allergies/relevant history reviewed, required imaging and test results available.   Sterile Technique Usual hand hygeine, masks, and gloves were used   Procedure Description Patient positioned in bed supine.  Sedation given as noted above.  Patient was intubated with endotracheal tube using Glidescope.  View was Grade 1 full glottis .  Number of attempts was 1.  Colorimetric CO2 detector was consistent with tracheal placement.   Complications/Tolerance None; patient tolerated the procedure well. Chest X-ray is ordered to verify placement.   EBL Minimal   Specimen(s) None

## 2022-02-20 NOTE — Progress Notes (Signed)
Significant event note:  Called to bedside for tachypnea, SOB and chest pain. Patient reports having positional pain ever since PA catheter was inserted earlier today. On my evaluation, patient was tachypneic, taking shallow breaths, and experiencing respiratory fatigue. He is currently on lasix 20 drip, amiodarone, heparin drip and IABP. CXR shows worsening pulmonary edema in b/l lung fields. Attempted biPAP x2 but he did not tolerate it due to respiratory distress. Lab review shows progressively worsening respiratory alkalosis with pH 7.48/27/105 likely secondary to tachypnea. CVP 22, PAP 63/54. CO decreased from 5 --> 4.0. In the setting of respiratory fatigue and worsening respiratory alkalosis, will intubate patient. Discussed plan with Dr. Haroldine Laws.  Loel Dubonnet MD MPH [facility]

## 2022-02-20 NOTE — Progress Notes (Signed)
CSW received consult for HCPOA . CSW paged Chaplain. Chaplain called CSW back. Chaplain informed CSW will go see patient shortly to assist with HCPOA questions.

## 2022-02-20 NOTE — Progress Notes (Addendum)
CXR reviewed with Dr Bensimhon/Dr Aundra Dubin.   IABP advanced 5 cm by Dr Aundra Dubin. IABP 1:1   Repeat CXR ordered  Dierre Crevier Np_C 3:05 PM   CXR reviewed by Dr Aundra Dubin. IABP advanced a little. Dressing reinforced for transer to [facility].   Alyssabeth Bruster NP-C  3:51 PM

## 2022-02-20 NOTE — Progress Notes (Signed)
Patient cell-phone given to wife Rashannon

## 2022-02-20 NOTE — Progress Notes (Addendum)
Advanced Heart Failure Rounding Note  PCP-Cardiologist: Glori Bickers, MD   Subjective:    8/10 S/P IABP 1:1 and swan placement. Elevated filling pressures and low output. Milrinone increased to 0.5 mcg and placed on norepi. Started on lasix drip. 8/11 Intubated.  Started on amio drip for A fib RVR.    Intubated over night.   Brisk diuresis noted. Negative 5.2 liters.   Creatinine 2.37>2.6  Swan #s  CVP 8-9  PA 44/30 (mean 35)  PCWP 30 (v-waves no longer present) Thermo CO 4.6 CI 2.1 SVR 2000 -> 1177 PVR 1.0 WU   Objective:   Weight Range: 95.3 kg Body mass index is 30.15 kg/m.   Vital Signs:   Temp:  [97.7 F (36.5 C)-100.8 F (38.2 C)] 99.7 F (37.6 C) (08/11 0700) Pulse Rate:  [0-224] 86 (08/11 0700) Resp:  [14-51] 14 (08/11 0700) BP: (99-154)/(60-100) 99/63 (08/11 0700) SpO2:  [87 %-100 %] 98 % (08/11 0700) Arterial Line BP: (84-132)/(50-94) 126/51 (08/11 0700) FiO2 (%):  [40 %-100 %] 75 % (08/11 0440) Weight:  [95.3 kg-97.1 kg] 95.3 kg (08/11 0600) Last BM Date : 02/19/22  Weight change: Filed Weights   02/19/22 1124 02/20/22 0600  Weight: 97.1 kg 95.3 kg    Intake/Output:   Intake/Output Summary (Last 24 hours) at 02/20/2022 0716 Last data filed at 02/20/2022 0600 Gross per 24 hour  Intake 1173.15 ml  Output 6425 ml  Net -5251.85 ml    CVP 8-9   Physical Exam    General: Intubated HEENT: Normal Neck: Supple. JVP 8-9  . Carotids 2+ bilat; no bruits. No lymphadenopathy or thyromegaly appreciated. RIJ swan  Cor: PMI nondisplaced. Regular rate & rhythm. No rubs. +S3  Lungs: Clear Abdomen: Soft, nontender, nondistended. No hepatosplenomegaly. No bruits or masses. Good bowel sounds. Extremities: No cyanosis, clubbing, rash, edema  R  IABP. RUE PICC  Neuro: Intubated/sedated.  GU Foley clear   Telemetry   SR  80s.   EKG    N/A  Labs    CBC Recent Labs    02/19/22 2011 02/20/22 0006 02/20/22 0500 02/20/22 0535  WBC 6.8   --  9.2  --   NEUTROABS 5.0  --   --   --   HGB 12.3*   < > 12.1* 15.0  HCT 36.4*   < > 35.2* 44.0  MCV 81.4  --  80.9  --   PLT 222  --  217  --    < > = values in this interval not displayed.   Basic Metabolic Panel Recent Labs    02/19/22 2011 02/20/22 0006 02/20/22 0500 02/20/22 0535  NA 131*   < > 129* 129*  K 3.9   < > 3.9 3.8  CL 96*  --  95*  --   CO2 22  --  20*  --   GLUCOSE 146*  --  169*  --   BUN 40*  --  41*  --   CREATININE 2.37*  --  2.64*  --   CALCIUM 8.9  --  8.7*  --   MG 1.9  --  2.3  --    < > = values in this interval not displayed.   Liver Function Tests Recent Labs    02/19/22 1002 02/19/22 2011  AST 29 28  ALT 24 24  ALKPHOS 63 65  BILITOT 3.1* 2.9*  PROT 6.6 6.2*  ALBUMIN 3.3* 3.3*   No results for input(s): "LIPASE", "AMYLASE" in  the last 72 hours. Cardiac Enzymes No results for input(s): "CKTOTAL", "CKMB", "CKMBINDEX", "TROPONINI" in the last 72 hours.  BNP: BNP (last 3 results) Recent Labs    01/05/22 1605 02/08/22 0822 02/19/22 1002  BNP 3,506.9* 3,036.5* 3,100.6*    ProBNP (last 3 results) No results for input(s): "PROBNP" in the last 8760 hours.   D-Dimer No results for input(s): "DDIMER" in the last 72 hours. Hemoglobin A1C No results for input(s): "HGBA1C" in the last 72 hours. Fasting Lipid Panel No results for input(s): "CHOL", "HDL", "LDLCALC", "TRIG", "CHOLHDL", "LDLDIRECT" in the last 72 hours. Thyroid Function Tests Recent Labs    02/19/22 1002  TSH 7.685*    Other results:   Imaging    DG CHEST PORT 1 VIEW  Result Date: 02/20/2022 CLINICAL DATA:  [id number].  Intra-aortic balloon pump. EXAM: PORTABLE CHEST 1 VIEW COMPARISON:  Portable chest yesterday at 6:52 p.m. FINDINGS: Right IJ pulmonary arterial catheter is again noted with the tip in the area of the distal right main pulmonary artery with slight interval pullback. IABP metallic marker again projects at the level of the left transverse process of  T8, area of the proximal descending colon. There are intact sternotomy sutures. Moderate to severe cardiomegaly. Perihilar vascular engorgement continues to be seen, with interval increased perihilar interstitial edema extending outward and increased perihilar ground-glass and lower zonal infiltrates most likely from ground-glass edema. Small pleural effusions appear similar. In all other respects no further changes. IMPRESSION: 1. Support devices as above. 2. Worsening perihilar edema and ground-glass lung opacities. Electronically Signed   By: Telford Nab M.D.   On: 02/20/2022 02:52   DG CHEST PORT 1 VIEW  Result Date: 02/19/2022 CLINICAL DATA:  [id number] central line placement EXAM: PORTABLE CHEST 1 VIEW COMPARISON:  February 08, 2022 FINDINGS: The cardiomediastinal silhouette is unchanged and enlarged in contour.RIGHT IJ approach PA catheter tip terminates over the RIGHT lower pulmonary artery. IABP metallic marker projects at the level of T8-9. Status post median sternotomy. Small RIGHT pleural effusion. No pneumothorax. Perihilar vascular fullness with mild interstitial prominence likely reflecting a degree of mild underlying pulmonary edema. Visualized abdomen is unremarkable. IMPRESSION: Support apparatus as described above. Electronically Signed   By: Valentino Saxon M.D.   On: 02/19/2022 19:03   CARDIAC CATHETERIZATION  Result Date: 02/19/2022 Findings: On milrinone 0.5 and NE 3 RA = 18 RV = 62/23 PA = 67/37 (50) PCW = 33 (v = 40-45) Fick cardiac output/index = 3.6/1.7 Thermo CO/CI 3.5/1.6 PVR = 4.8 SVR = 2,000 FA sat = 85% PA sat = 47%, 51% PAPi = 1.7 Assessment: 1. Severe biventricular HF with markedly elevated volume status and low ouput 2. Successful placement of IABP and Swan Plan/Discussion: Continue current support. Watch hemodynamics closely in setting of IABP and significant AI. Diurese. Once stabilized will transport to [facility] for transplant evaluation. If decompensated will need Impella 5.5  +/- Protek RV support. Glori Bickers, MD 5:33 PM    Medications:     Scheduled Medications:  Chlorhexidine Gluconate Cloth  6 each Topical Daily   docusate  100 mg Per Tube BID   fentaNYL (SUBLIMAZE) injection  50 mcg Intravenous Once   pantoprazole  40 mg Per Tube Daily   polyethylene glycol  17 g Per Tube Daily   potassium chloride  40 mEq Per Tube Once   sodium chloride flush  3 mL Intravenous Q12H   sodium chloride flush  3 mL Intravenous Q12H    Infusions:  sodium chloride  amiodarone 30 mg/hr (02/20/22 0600)   fentaNYL infusion INTRAVENOUS 50 mcg/hr (02/20/22 0600)   furosemide (LASIX) 200 mg in dextrose 5 % 100 mL (2 mg/mL) infusion 20 mg/hr (02/20/22 0600)   heparin 1,200 Units/hr (02/20/22 0600)   milrinone 0.5 mcg/kg/min (02/20/22 0600)   norepinephrine (LEVOPHED) Adult infusion 12 mcg/min (02/20/22 0600)   propofol (DIPRIVAN) infusion 20 mcg/kg/min (02/20/22 0600)    PRN Medications: sodium chloride, acetaminophen, acetaminophen, fentaNYL, ondansetron (ZOFRAN) IV, mouth rinse, sodium chloride flush    Patient Profile  Mr. Marzella is a 41 y.o.with chronic biventricular HF, NICM, hypertension and CKD stage IIIb, and severe AI.   Assessment/Plan  1) Acute on Chronic Biventricular HF: NICM,   - Echo ~20% in 2013. EF 45-50% in 2016. Suspect due to ongoing AI +/- HTN - s/p AoV repair 10/18. F/u echo 12/18 with EF 45-50% moderate AI - Echo (2019): EF 30-35% Mod AI.  - Echo (8/20): EF 45% (out of range for ICD) - Echo (3/21): EF 45% moderate AI. LV dimensions stable (LVIDd 6.5cm) - Echo (10/22): EF 30-35% LV dilated. Mild-mod AI Mod-severe central MR (LVIDd 6.0cm)  - Echo (6/23): EF 20-25%, severe biventricular dysfunction, mild to moderate MR, severe AI (V max  2.9 m/s, MG 16 mmHG.) - admitted with marked volume overload in the setting of cardiogenic shock.  IABP placed, milrinone increased 0.5 mcg, and norepi started.  -Today. CO 5.8  CI 2.7.  - CVP down  to 8-9. Creatinine trending back up. Cut back lasix drip to 10 mg per hour.  - Will need transfer to [facility] for transplant consideration. Blood Type B+    2) Acute Respiratory Failure CCM consulted. Placed on Bipap initially but intubated this morning.    S/P AV repair 05/01/2017 - LV remains down and severely dilated on last echo (6/23).  - TEE 01/20/22  shows Aortic valve has been replaced, regurgitation is severe. - Referral to [facility] for transplant   3) Mitral regurgitation - functional. Moderate to severe MR on echo 6/23. - MV peak gradient 9.1 mmHg, mean mitral valve gradient 2.0 mmHg.   5) CKD stage III   - baseline Cr 1.5-1.7  - Creatinine on admit 2.6--->2.3>2.6 today - Cut back lasix drip to 10 mg per hour.    6) PVCS  On amio drip.   7) A fib RVR -Started on amio drip with conversion to SR.   - On heparin drip.    8) SDOH - Referred to [facility] to transplant evaluation.  - Lives with his wife and 41 year old  twin boys.  - No h/o smoking/drug use/alcohol  Anticipate transfer to [facility] for transplant evaluation.   Length of Stay: 1  Amy Clegg, NP  02/20/2022, 7:16 AM  Advanced Heart Failure Team Pager [phone] (M-F; 7a - 5p)  Please contact [facility] for night-coverage after hours (5p -7a ) and weekends on [email or web address]  Agree with above.   Swan and IABP placed yesterday. Marked diuresis. Weight down almost 5 pounds. Tired out overnight and intubated.   On milrinone 0.5. Scr up slightly. Acidosis resolved.   Rhythm stable on IV amio (had some AF overnight)  General:  Intubated/sedated HEENT: normal Neck: supple. RIJ swan Carotids 2+ bilat; no bruits. No lymphadenopathy or thryomegaly appreciated. Cor: PMI nondisplaced. Regular +s3 Lungs: clear Abdomen: soft, nontender, nondistended. No hepatosplenomegaly. No bruits or masses. Good bowel sounds. Extremities: no cyanosis, clubbing, rash, edema RFA IABP  Neuro: intubated sedated  Hemodynamically much more  stable  this am though he is now intubated and has some AKI. Continue current support. IABP not ideal with severe AI but seems to have helped hemodynamics significantly.   Will d/w [facility] today regarding transfer for transplant eval (heart vs heart/kidney).   CRITICAL CARE Performed by: Glori Bickers  Total critical care time: 45 minutes  Critical care time was exclusive of separately billable procedures and treating other patients.  Critical care was necessary to treat or prevent imminent or life-threatening deterioration.  Critical care was time spent personally by me (independent of midlevel providers or residents) on the following activities: development of treatment plan with patient and/or surrogate as well as nursing, discussions with consultants, evaluation of patient's response to treatment, examination of patient, obtaining history from patient or surrogate, ordering and performing treatments and interventions, ordering and review of laboratory studies, ordering and review of radiographic studies, pulse oximetry and re-evaluation of patient's condition.  Glori Bickers, MD  8:32 AM

## 2022-02-20 NOTE — Progress Notes (Signed)
ANTICOAGULATION CONSULT NOTE   Pharmacy Consult for Heparin Indication:  IABP  No Known Allergies  Patient Measurements: Weight: 97.1 kg (214 lb 1.1 oz) Heparin Dosing Weight: 95.4  Vital Signs: Temp: 98.6 F (37 C) (08/11 0000) BP: 109/68 (08/11 0000) Pulse Rate: 101 (08/11 0000)  Labs: Recent Labs    02/19/22 1002 02/19/22 1635 02/19/22 1717 02/19/22 2011 02/20/22 0006 02/20/22 0036  HGB 13.0   < > 13.9 12.3* 12.9*  --   HCT 37.6*   < > 41.0 36.4* 38.0*  --   PLT 222  --   --  222  --   --   HEPARINUNFRC  --   --   --   --   --  0.15*  CREATININE 2.58*  --   --  2.37*  --   --    < > = values in this interval not displayed.     Estimated Creatinine Clearance: 48.4 mL/min (A) (by C-G formula based on SCr of 2.37 mg/dL (H)).   Medical History: Past Medical History:  Diagnosis Date   Anxiety    CHF (congestive heart failure) (HCC)    Chronic systolic heart failure (HCC)    CKD (chronic kidney disease) stage 2, GFR 60-89 ml/min    Dyspnea    with value issues- "if I dont take my medication"   Essential hypertension, benign    Headache    History of pneumonia    Mitral regurgitation    Moderate   Noncompliance    Nonischemic cardiomyopathy (HCC)    Normal coronaries May 2012, LVEF < 20%   Pneumonia 2011   S/P aortic valve repair 05/11/2017   Complex valvuloplasty including plication of left coronary leaflet and 56m Biostable HAART ring annuloplasty    Medications:  Scheduled:   Chlorhexidine Gluconate Cloth  6 each Topical Daily   sodium chloride flush  3 mL Intravenous Q12H   sodium chloride flush  3 mL Intravenous Q12H    Assessment: 41y/o male presented to cath lab for IABP placement. Pharmacy consulted to dose heparin for bridging [facility] is [facility]. No signs of bleeding noted.  8/11 AM update:  Heparin level below goal Hgb stable  Goal of Therapy:  Heparin level 0.2-0.5 units/ml Monitor platelets by  anticoagulation protocol: Yes   Plan:  Inc heparin to 1200 units/hr 0900 heparin level  JNarda Bonds PharmD, BCPS Clinical Pharmacist Phone: [phone]

## 2022-02-20 NOTE — Progress Notes (Signed)
   02/20/22 1115  Clinical Encounter Type  Visited With Family (WIFE: Janora Norlander)  Visit Type Initial  Referral From Nurse  Consult/Referral To Chaplain Melvenia Beam)  Advance Directives (For Healthcare)  Does Patient Have a Medical Advance Directive? Unable to assess, patient is non-responsive or altered mental status  Mental Health Advance Directives  Does Patient Have a Mental Health Advance Directive? Unable to assess, patient is non-responsive or altered mental status   This chaplain was paged for Advanced Directive Education by patient's nurse. Upon arrival met patient's wife, Davin Archuletta, at patient's bedside. Chaplain explained that because of patient's current state of unresponsiveness Advance Directive: [place] and Attorney and Living Will could not be completed at this time and that as his spouse, she will automatically be designated as his [place] under [employer] unless an attorney at fact had been designated by the court. [address] [place], [place] [phone]

## 2022-02-20 NOTE — Discharge Summary (Addendum)
Advanced Heart Failure Team  Discharge Summary   Patient ID: [name] [name] MRN: [id number], DOB/AGE: 1980/08/30 41 y.o. Admit date: 02/19/2022 D/C date:     02/20/2022   Primary Discharge Diagnoses:  1. A/C Biventricular Hear Failure--> Cardiogenic Shock  2. Acute Respiratory Failure 3. S/P AVR--> Now with Severe AI 4. Mitral Regurgitation  5. AKI on CKD Stage IIIa 6. PVC 7. A fib RVR 8. Blood Type B+   Hospital Course:   [name] [name] is a 62 year with a history of HFrEF/ NICM dating back to 2012 which had improved in 2013. HF meds stopped and had recurrent HFrEF 2013 with EF down to 20%. Also has HTN, PVCs, [name], Aortic Insufficiency,  and previous aortic valve repair 2018 (with 55m annuloplasty ring) and now with severe severe biventricular HF due to NICM.   Over the last couple months  he required admit for acute decompensated heart failure. Admitted 12/22/21 with A/C HFrEF. Diuresed with IV lasix. FWilder Gladestopped at patient's request. Echo showed EF down to 20-25%, severe AI . Had TEE 01/20/22 with severe central AI, EF 20%, and  severe RV dysfunction with plan for CPX and transplant evaluation.   Most recent admit 02/09/22 with A/C HFrEF-->low output. Diuresed with IV lasix and placed on milrinone. Unable to wean milrinone and discharged on milrinone 0.375 mcg. Discharged on 02/12/22. Worsening over the last 7 days.   Admitted from HF clinic on milrinone 0.375 mcg with A/C HFrEF -->cardiogenic shock. Milrinone increased to 0.5 mcg and given IV lasix. Poor response with minimal urine output and low mixed venous saturation. Low-dose norepi added without response. Given progressive acidosis taken to the cath lab for RHC/swan. Hemodynamics showed marked volume overload and low output. Decision was made to try IABP (despite severe AI). While in the cath lab developed respiratory distress. Bipap placed with improvement.   Over night had brisk diuresis, mixed venous sat up to 70%, and cardiac  index improved. However developed respiratory distress despite Bipap and required intubation. Developed A fib RVR. Started on amio drip with chemical conversion.  [name] BHaroldine Lawsdiscussed with [name] [name] for transplant. I spoke to [name] LAugustin CoupeCCM [name].   Cath lab: Initial swan #s 02/19/22 on milrinone 0.5 and NE 3  RA = 18 RV = 62/23 PA = 67/37 (50) PCW = 33 (v = 40-45) Fick cardiac output/index = 3.6/1.7 Thermo CO/CI 3.5/1.6 PVR = 4.8 SVR = 2,000 FA sat = 85% PA sat = 47%, 51% PAPi = 1.7     02/20/22 Swan #s on Milrinone 0.5 mcg + Norepi 8 mcg + IABP  CVP 8-9  PA 44/30 (mean 35)  PCWP 30 (v-waves no longer present) Thermo CO 4.6 CI 2.1 SVR 2000 -> 1177 PVR 1.0 WU   Discharge Vitals: Blood pressure (!) 98/59, pulse 80, temperature 98.6 F (37 C), resp. rate 20, weight 95.3 kg, SpO2 96 %.  Labs: Lab Results  Component Value Date   WBC 9.2 02/20/2022   HGB 15.0 02/20/2022   HCT 44.0 02/20/2022   MCV 80.9 02/20/2022   PLT 217 02/20/2022    Recent Labs  Lab 02/19/22 2011 02/20/22 0006 02/20/22 0500 02/20/22 0535  NA 131*   < > 129* 129*  K 3.9   < > 3.9 3.8  CL 96*  --  95*  --   CO2 22  --  20*  --   BUN 40*  --  41*  --   CREATININE 2.37*  --  2.64*  --   CALCIUM 8.9  --  8.7*  --   PROT 6.2*  --   --   --   BILITOT 2.9*  --   --   --   ALKPHOS 65  --   --   --   ALT 24  --   --   --   AST 28  --   --   --   GLUCOSE 146*  --  169*  --    < > = values in this interval not displayed.   Lab Results  Component Value Date   CHOL 118 02/09/2022   HDL 24 (L) 02/09/2022   LDLCALC 75 02/09/2022   TRIG 94 02/09/2022   BNP (last 3 results) Recent Labs    01/05/22 1605 02/08/22 0822 02/19/22 1002  BNP 3,506.9* 3,036.5* 3,100.6*    ProBNP (last 3 results) No results for input(s): "PROBNP" in the last 8760 hours.   Diagnostic Studies/Procedures   DG Abd Portable 1V  Result Date: 02/20/2022 CLINICAL DATA:  [id number] OG tube placement EXAM:  PORTABLE ABDOMEN - 1 VIEW COMPARISON:  None Available. FINDINGS: There is NG tube insertion with its tip at the proximal body of the stomach. Mild gaseous distention of the small and large bowels of likely ileus. IMPRESSION: As above Electronically Signed   By: Frazier Richards M.D.   On: 02/20/2022 09:04   DG Chest Port 1 View  Result Date: 02/20/2022 CLINICAL DATA:  Intubation EXAM: PORTABLE CHEST 1 VIEW COMPARISON:  Earlier today FINDINGS: Endotracheal tube with tip between the clavicular heads and carina. Swan-Ganz catheter with tip over the right hilum. Aortic balloon pump with tip 7.5 cm above the upper margin of the aortic knob. Right PICC with tip obscured. Cardiomegaly and pulmonary artery enlargement. Right lower lobe atelectasis/collapse since prior. Pulmonary vascular congestion. IMPRESSION: 1. Intubation with unremarkable endotracheal tube position. Other hardware in similar position. 2. Interval right lower lobe collapse. Otherwise stable aeration with vascular congestion/edema. Electronically Signed   By: Jorje Guild M.D.   On: 02/20/2022 06:50   DG CHEST PORT 1 VIEW  Result Date: 02/20/2022 CLINICAL DATA:  [id number].  Intra-aortic balloon pump. EXAM: PORTABLE CHEST 1 VIEW COMPARISON:  Portable chest yesterday at 6:52 p.m. FINDINGS: Right IJ pulmonary arterial catheter is again noted with the tip in the area of the distal right main pulmonary artery with slight interval pullback. IABP metallic marker again projects at the level of the left transverse process of T8, area of the proximal descending colon. There are intact sternotomy sutures. Moderate to severe cardiomegaly. Perihilar vascular engorgement continues to be seen, with interval increased perihilar interstitial edema extending outward and increased perihilar ground-glass and lower zonal infiltrates most likely from ground-glass edema. Small pleural effusions appear similar. In all other respects no further changes. IMPRESSION: 1.  Support devices as above. 2. Worsening perihilar edema and ground-glass lung opacities. Electronically Signed   By: Telford Nab M.D.   On: 02/20/2022 02:52   DG CHEST PORT 1 VIEW  Result Date: 02/19/2022 CLINICAL DATA:  [id number] central line placement EXAM: PORTABLE CHEST 1 VIEW COMPARISON:  February 08, 2022 FINDINGS: The cardiomediastinal silhouette is unchanged and enlarged in contour.RIGHT IJ approach PA catheter tip terminates over the RIGHT lower pulmonary artery. IABP metallic marker projects at the level of T8-9. Status post median sternotomy. Small RIGHT pleural effusion. No pneumothorax. Perihilar vascular fullness with mild interstitial prominence likely reflecting a degree of mild underlying pulmonary edema. Visualized abdomen  is unremarkable. IMPRESSION: Support apparatus as described above. Electronically Signed   By: Valentino Saxon M.D.   On: 02/19/2022 19:03   CARDIAC CATHETERIZATION  Result Date: 02/19/2022 Findings: On milrinone 0.5 and NE 3 RA = 18 RV = 62/23 PA = 67/37 (50) PCW = 33 (v = 40-45) Fick cardiac output/index = 3.6/1.7 Thermo CO/CI 3.5/1.6 PVR = 4.8 SVR = 2,000 FA sat = 85% PA sat = 47%, 51% PAPi = 1.7 Assessment: 1. Severe biventricular HF with markedly elevated volume status and low ouput 2. Successful placement of IABP and Swan Plan/Discussion: Continue current support. Watch hemodynamics closely in setting of IABP and significant AI. Diurese. Once stabilized will transport to [facility] for transplant evaluation. If decompensated will need Impella 5.5 +/- Protek RV support. Glori Bickers, MD 5:33 PM   Discharge Medications   Allergies as of 02/20/2022   No Known Allergies      Medication List     STOP taking these medications    acetaminophen 500 MG tablet Commonly known as: TYLENOL   allopurinol 300 MG tablet Commonly known as: ZYLOPRIM   ALPRAZolam 0.5 MG tablet Commonly known as: XANAX   amiodarone 200 MG tablet Commonly known as: PACERONE    polyethylene glycol powder 17 GM/SCOOP powder Commonly known as: GLYCOLAX/MIRALAX   potassium chloride SA 20 MEQ tablet Commonly known as: KLOR-CON M   Senexon-S 8.6-50 MG tablet Generic drug: senna-docusate   torsemide 20 MG tablet Commonly known as: DEMADEX       TAKE these medications    amiodarone 360-4.14 MG/200ML-% Soln Commonly known as: NEXTERONE PREMIX Inject 60 mg/hr into the vein continuous.   docusate 50 MG/5ML liquid Commonly known as: COLACE Place 10 mLs (100 mg total) into feeding tube 2 (two) times daily.   furosemide 200 mg in dextrose 5 % 80 mL Inject 10 mg/hr into the vein continuous.   heparin 25000 UT/250ML infusion Inject 1,200 Units/hr into the vein continuous.   milrinone 20 MG/100 ML Soln infusion Commonly known as: PRIMACOR Inject 0.0486 mg/min into the vein continuous. What changed:  how much to take how fast to infuse this   norepinephrine 16-5 MG/250ML-% Soln Commonly known as: LEVOPHED Inject 0-40 mcg/min into the vein continuous.   ondansetron 4 MG/2ML Soln injection Commonly known as: ZOFRAN Inject 2 mLs (4 mg total) into the vein every 6 (six) hours as needed for nausea.   pantoprazole sodium 40 mg Commonly known as: PROTONIX Place 40 mg into feeding tube daily.   propofol 1000 MG/100ML Emul injection Commonly known as: DIPRIVAN Inject 0-4,855 mcg/min into the vein continuous.        Disposition   The patient will be discharged  to [facility] for transplant evaluation.    Duration of Discharge Encounter: Greater than 35 minutes   Signed, Amy Clegg NP-C  02/20/2022, 9:58 AM  Patient seen and examined with the above-signed Advanced Practice Provider and/or Housestaff. I personally reviewed laboratory data, imaging studies and relevant notes. I independently examined the patient and formulated the important aspects of the plan. I have edited the note to reflect any of my changes or salient points. I have personally  discussed the plan with the patient and/or family.  I have edited note with my changes. Case d/w Drs/ Mosetta [name] and Wannetta Sender at [place].   Stabilization options limited in setting of severe AI. Will continue IABP for now (can consider dropping back to 1:2).   Appreciate [facility]'s support.  Glori Bickers, MD  11:01 AM

## 2022-02-20 NOTE — Procedures (Signed)
Arterial Catheter Insertion Procedure Note  AYDYN TESTERMAN  [id number]  March 06, 1981  Date:02/20/22  Time:4:51 AM    Provider Performing: Quentin Ore    Procedure: Insertion of Arterial Line [phone]) without US guidance  Indication(s) Blood pressure monitoring and/or need for frequent ABGs  Consent Unable to obtain consent due to emergent nature of procedure.  Anesthesia None   Time Out Verified patient identification, verified procedure, site/side was marked, verified correct patient position, special equipment/implants available, medications/allergies/relevant history reviewed, required imaging and test results available.   Sterile Technique Maximal sterile technique including full sterile barrier drape, hand hygiene, sterile gown, sterile gloves, mask, hair covering, sterile ultrasound probe cover (if used).   Procedure Description Area of catheter insertion was cleaned with chlorhexidine and draped in sterile fashion. Without real-time ultrasound guidance an arterial catheter was placed into the left radial artery.  Appropriate arterial tracings confirmed on monitor.     Complications/Tolerance None; patient tolerated the procedure well.   EBL Minimal   Specimen(s) None

## 2022-02-20 NOTE — Progress Notes (Signed)
ANTICOAGULATION CONSULT NOTE   Pharmacy Consult for Heparin Indication:  IABP  No Known Allergies  Patient Measurements: Weight: 95.3 kg (210 lb 1.6 oz) Heparin Dosing Weight: 95.4  Vital Signs: Temp: 98.2 F (36.8 C) (08/11 1000) BP: 102/59 (08/11 1000) Pulse Rate: 81 (08/11 1000)  Labs: Recent Labs    02/19/22 1002 02/19/22 1635 02/19/22 2011 02/20/22 0006 02/20/22 0036 02/20/22 0500 02/20/22 0535 02/20/22 0922  HGB 13.0   < > 12.3* 12.9*  --  12.1* 15.0  --   HCT 37.6*   < > 36.4* 38.0*  --  35.2* 44.0  --   PLT 222  --  222  --   --  217  --   --   HEPARINUNFRC  --   --   --   --  0.15*  --   --  0.10*  CREATININE 2.58*  --  2.37*  --   --  2.64*  --   --    < > = values in this interval not displayed.     Estimated Creatinine Clearance: 43.1 mL/min (A) (by C-G formula based on SCr of 2.64 mg/dL (H)).   Medical History: Past Medical History:  Diagnosis Date   Anxiety    CHF (congestive heart failure) (HCC)    Chronic systolic heart failure (HCC)    CKD (chronic kidney disease) stage 2, GFR 60-89 ml/min    Dyspnea    with value issues- "if I dont take my medication"   Essential hypertension, benign    Headache    History of pneumonia    Mitral regurgitation    Moderate   Noncompliance    Nonischemic cardiomyopathy (HCC)    Normal coronaries May 2012, LVEF < 20%   Pneumonia 2011   S/P aortic valve repair 05/11/2017   Complex valvuloplasty including plication of left coronary leaflet and 64m Biostable HAART ring annuloplasty    Medications:  Scheduled:   Chlorhexidine Gluconate Cloth  6 each Topical Daily   docusate  100 mg Per Tube BID   fentaNYL (SUBLIMAZE) injection  50 mcg Intravenous Once   pantoprazole  40 mg Per Tube Daily   polyethylene glycol  17 g Per Tube Daily   sodium chloride flush  3 mL Intravenous Q12H   sodium chloride flush  3 mL Intravenous Q12H    Assessment: 41y/o male presented to cath lab for IABP placement. Pharmacy  consult[facility] to dose heparin [facility]. No signs of bleeding noted. Heparin level is subtherapeutic at 0.1 after rate increase. NO issues with infusion.  Goal of Therapy:  Heparin level 0.2-0.5 units/ml Monitor platelets by anticoagulation protocol: Yes   Plan:  Increase heparin to 1700 units/hr 0900 heparin level  Thank you for allowing pharmacy to participate in this patient's care.  NReatha Harps PharmD PGY2 Pharmacy Resident 02/20/2022 10:52 AM Check [email or web address] for unit specific pharmacy number

## 2022-02-21 DIAGNOSIS — I509 Heart failure, unspecified: Secondary | ICD-10-CM | POA: Diagnosis not present

## 2022-02-21 LAB — LIPOPROTEIN A (LPA): Lipoprotein (a): 193.2 nmol/L — ABNORMAL HIGH (ref <75.0)

## 2022-02-22 DIAGNOSIS — I509 Heart failure, unspecified: Secondary | ICD-10-CM | POA: Diagnosis not present

## 2022-02-23 ENCOUNTER — Other Ambulatory Visit (HOSPITAL_COMMUNITY): Payer: Self-pay | Admitting: Internal Medicine

## 2022-02-23 DIAGNOSIS — I509 Heart failure, unspecified: Secondary | ICD-10-CM | POA: Diagnosis not present

## 2022-02-24 ENCOUNTER — Telehealth: Payer: Self-pay | Admitting: *Deleted

## 2022-02-24 DIAGNOSIS — I509 Heart failure, unspecified: Secondary | ICD-10-CM | POA: Diagnosis not present

## 2022-02-24 NOTE — Chronic Care Management (AMB) (Unsigned)
  Care Coordination  Outreach Note  02/24/2022 Name: QUANELL LOUGHNEY MRN: [id number] DOB: 1980/11/13   Care Coordination Outreach Attempts  An unsuccessful telephone outreach was attempted today to reschedule missed initial call with RNCM.   Follow Up Plan:  Additional outreach attempts will be made to offer the patient care coordination information and services.   Encounter Outcome:  No Answer  [place]  Direct Dial: [phone]

## 2022-02-25 DIAGNOSIS — I509 Heart failure, unspecified: Secondary | ICD-10-CM | POA: Diagnosis not present

## 2022-02-26 NOTE — Chronic Care Management (AMB) (Signed)
  Care Coordination  Outreach Note  02/26/2022 Name: [name] [name] MRN: [id number] DOB: 08/06/1980   Care Coordination Outreach Attempts  A third unsuccessful outreach was attempted today to offer the patient with information about available care coordination services as a benefit of their health plan.   Follow Up Plan:  No further outreach attempts will be made at this time. We have been unable to contact the patient to offer or enroll patient in care coordination services  Encounter Outcome:  No Answer  [place]: [phone]

## 2022-03-09 ENCOUNTER — Encounter (HOSPITAL_COMMUNITY): Payer: BC Managed Care – PPO | Admitting: Internal Medicine

## 2022-03-11 DIAGNOSIS — I509 Heart failure, unspecified: Secondary | ICD-10-CM | POA: Diagnosis not present

## 2022-03-12 DIAGNOSIS — I420 Dilated cardiomyopathy: Secondary | ICD-10-CM | POA: Diagnosis not present

## 2022-03-13 ENCOUNTER — Emergency Department (HOSPITAL_COMMUNITY): Payer: BC Managed Care – PPO

## 2022-03-13 ENCOUNTER — Other Ambulatory Visit (HOSPITAL_COMMUNITY): Payer: BC Managed Care – PPO

## 2022-03-13 ENCOUNTER — Other Ambulatory Visit: Payer: Self-pay

## 2022-03-13 ENCOUNTER — Encounter (HOSPITAL_COMMUNITY): Payer: BC Managed Care – PPO | Admitting: Internal Medicine

## 2022-03-13 ENCOUNTER — Encounter (HOSPITAL_COMMUNITY): Payer: Self-pay | Admitting: Emergency Medicine

## 2022-03-13 ENCOUNTER — Inpatient Hospital Stay (HOSPITAL_COMMUNITY): Payer: BC Managed Care – PPO

## 2022-03-13 ENCOUNTER — Inpatient Hospital Stay (HOSPITAL_COMMUNITY)
Admission: EM | Admit: 2022-03-13 | Discharge: 2022-03-23 | DRG: 291 | Disposition: A | Discharge status: Home Health | Payer: BC Managed Care – PPO | Attending: Internal Medicine | Admitting: Internal Medicine

## 2022-03-13 DIAGNOSIS — R918 Other nonspecific abnormal finding of lung field: Secondary | ICD-10-CM | POA: Diagnosis not present

## 2022-03-13 DIAGNOSIS — I4892 Unspecified atrial flutter: Secondary | ICD-10-CM | POA: Diagnosis not present

## 2022-03-13 DIAGNOSIS — I5023 Acute on chronic systolic (congestive) heart failure: Secondary | ICD-10-CM | POA: Diagnosis not present

## 2022-03-13 DIAGNOSIS — R739 Hyperglycemia, unspecified: Secondary | ICD-10-CM | POA: Diagnosis not present

## 2022-03-13 DIAGNOSIS — I2609 Other pulmonary embolism with acute cor pulmonale: Secondary | ICD-10-CM

## 2022-03-13 DIAGNOSIS — E871 Hypo-osmolality and hyponatremia: Secondary | ICD-10-CM | POA: Diagnosis present

## 2022-03-13 DIAGNOSIS — Z7982 Long term (current) use of aspirin: Secondary | ICD-10-CM | POA: Diagnosis not present

## 2022-03-13 DIAGNOSIS — Z4682 Encounter for fitting and adjustment of non-vascular catheter: Secondary | ICD-10-CM | POA: Diagnosis not present

## 2022-03-13 DIAGNOSIS — Z20822 Contact with and (suspected) exposure to covid-19: Secondary | ICD-10-CM | POA: Diagnosis not present

## 2022-03-13 DIAGNOSIS — J810 Acute pulmonary edema: Secondary | ICD-10-CM | POA: Diagnosis not present

## 2022-03-13 DIAGNOSIS — I428 Other cardiomyopathies: Secondary | ICD-10-CM | POA: Diagnosis not present

## 2022-03-13 DIAGNOSIS — J9811 Atelectasis: Secondary | ICD-10-CM | POA: Diagnosis not present

## 2022-03-13 DIAGNOSIS — N1832 Chronic kidney disease, stage 3b: Secondary | ICD-10-CM | POA: Diagnosis present

## 2022-03-13 DIAGNOSIS — Z8701 Personal history of pneumonia (recurrent): Secondary | ICD-10-CM

## 2022-03-13 DIAGNOSIS — I5043 Acute on chronic combined systolic (congestive) and diastolic (congestive) heart failure: Secondary | ICD-10-CM | POA: Diagnosis not present

## 2022-03-13 DIAGNOSIS — G4700 Insomnia, unspecified: Secondary | ICD-10-CM | POA: Diagnosis present

## 2022-03-13 DIAGNOSIS — Z8249 Family history of ischemic heart disease and other diseases of the circulatory system: Secondary | ICD-10-CM

## 2022-03-13 DIAGNOSIS — N170 Acute kidney failure with tubular necrosis: Secondary | ICD-10-CM | POA: Diagnosis present

## 2022-03-13 DIAGNOSIS — Z7901 Long term (current) use of anticoagulants: Secondary | ICD-10-CM

## 2022-03-13 DIAGNOSIS — I5082 Biventricular heart failure: Secondary | ICD-10-CM | POA: Diagnosis not present

## 2022-03-13 DIAGNOSIS — I480 Paroxysmal atrial fibrillation: Secondary | ICD-10-CM | POA: Diagnosis present

## 2022-03-13 DIAGNOSIS — Z452 Encounter for adjustment and management of vascular access device: Secondary | ICD-10-CM | POA: Diagnosis not present

## 2022-03-13 DIAGNOSIS — K5900 Constipation, unspecified: Secondary | ICD-10-CM | POA: Diagnosis not present

## 2022-03-13 DIAGNOSIS — J189 Pneumonia, unspecified organism: Secondary | ICD-10-CM | POA: Diagnosis not present

## 2022-03-13 DIAGNOSIS — E876 Hypokalemia: Secondary | ICD-10-CM | POA: Diagnosis not present

## 2022-03-13 DIAGNOSIS — E785 Hyperlipidemia, unspecified: Secondary | ICD-10-CM | POA: Diagnosis present

## 2022-03-13 DIAGNOSIS — R0902 Hypoxemia: Secondary | ICD-10-CM | POA: Diagnosis not present

## 2022-03-13 DIAGNOSIS — Z79899 Other long term (current) drug therapy: Secondary | ICD-10-CM | POA: Diagnosis not present

## 2022-03-13 DIAGNOSIS — I501 Left ventricular failure: Secondary | ICD-10-CM | POA: Diagnosis not present

## 2022-03-13 DIAGNOSIS — R570 Cardiogenic shock: Secondary | ICD-10-CM | POA: Diagnosis not present

## 2022-03-13 DIAGNOSIS — I517 Cardiomegaly: Secondary | ICD-10-CM | POA: Diagnosis not present

## 2022-03-13 DIAGNOSIS — R110 Nausea: Secondary | ICD-10-CM | POA: Diagnosis not present

## 2022-03-13 DIAGNOSIS — I130 Hypertensive heart and chronic kidney disease with heart failure and stage 1 through stage 4 chronic kidney disease, or unspecified chronic kidney disease: Secondary | ICD-10-CM | POA: Diagnosis not present

## 2022-03-13 DIAGNOSIS — I4819 Other persistent atrial fibrillation: Secondary | ICD-10-CM | POA: Diagnosis not present

## 2022-03-13 DIAGNOSIS — N179 Acute kidney failure, unspecified: Secondary | ICD-10-CM

## 2022-03-13 DIAGNOSIS — D649 Anemia, unspecified: Secondary | ICD-10-CM | POA: Diagnosis present

## 2022-03-13 DIAGNOSIS — Z953 Presence of xenogenic heart valve: Secondary | ICD-10-CM | POA: Diagnosis not present

## 2022-03-13 DIAGNOSIS — E872 Acidosis, unspecified: Secondary | ICD-10-CM | POA: Diagnosis present

## 2022-03-13 DIAGNOSIS — I4891 Unspecified atrial fibrillation: Principal | ICD-10-CM

## 2022-03-13 DIAGNOSIS — F419 Anxiety disorder, unspecified: Secondary | ICD-10-CM | POA: Diagnosis present

## 2022-03-13 DIAGNOSIS — I110 Hypertensive heart disease with heart failure: Secondary | ICD-10-CM | POA: Diagnosis not present

## 2022-03-13 DIAGNOSIS — J90 Pleural effusion, not elsewhere classified: Secondary | ICD-10-CM | POA: Diagnosis not present

## 2022-03-13 DIAGNOSIS — I509 Heart failure, unspecified: Secondary | ICD-10-CM | POA: Diagnosis not present

## 2022-03-13 DIAGNOSIS — R0789 Other chest pain: Secondary | ICD-10-CM | POA: Diagnosis not present

## 2022-03-13 DIAGNOSIS — I340 Nonrheumatic mitral (valve) insufficiency: Secondary | ICD-10-CM | POA: Diagnosis present

## 2022-03-13 DIAGNOSIS — J984 Other disorders of lung: Secondary | ICD-10-CM | POA: Diagnosis not present

## 2022-03-13 DIAGNOSIS — I499 Cardiac arrhythmia, unspecified: Secondary | ICD-10-CM | POA: Diagnosis not present

## 2022-03-13 DIAGNOSIS — R079 Chest pain, unspecified: Secondary | ICD-10-CM | POA: Diagnosis not present

## 2022-03-13 DIAGNOSIS — J9601 Acute respiratory failure with hypoxia: Secondary | ICD-10-CM | POA: Diagnosis present

## 2022-03-13 LAB — LACTIC ACID, PLASMA
Lactic Acid, Venous: 4.4 mmol/L (ref 0.5–1.9)
Lactic Acid, Venous: 1.8 mmol/L (ref 0.5–1.9)

## 2022-03-13 LAB — COMPREHENSIVE METABOLIC PANEL
ALT: 28 U/L (ref 0–44)
AST: 43 U/L — ABNORMAL HIGH (ref 15–41)
Albumin: 2.7 g/dL — ABNORMAL LOW (ref 3.5–5.0)
Alkaline Phosphatase: 98 U/L (ref 38–126)
Anion gap: 16 — ABNORMAL HIGH (ref 5–15)
BUN: 29 mg/dL — ABNORMAL HIGH (ref 6–20)
CO2: 19 mmol/L — ABNORMAL LOW (ref 22–32)
Calcium: 8.5 mg/dL — ABNORMAL LOW (ref 8.9–10.3)
Chloride: 95 mmol/L — ABNORMAL LOW (ref 98–111)
Creatinine, Ser: 2.45 mg/dL — ABNORMAL HIGH (ref 0.61–1.24)
GFR, Estimated: 33 mL/min — ABNORMAL LOW (ref >60)
Glucose, Bld: 344 mg/dL — ABNORMAL HIGH (ref 70–99)
Potassium: 4.4 mmol/L (ref 3.5–5.1)
Sodium: 130 mmol/L — ABNORMAL LOW (ref 135–145)
Total Bilirubin: 1.4 mg/dL — ABNORMAL HIGH (ref 0.3–1.2)
Total Protein: 6.3 g/dL — ABNORMAL LOW (ref 6.5–8.1)

## 2022-03-13 LAB — BLOOD GAS, ARTERIAL
Acid-base deficit: 5.4 mmol/L — ABNORMAL HIGH (ref 0.0–2.0)
Bicarbonate: 19.2 mmol/L — ABNORMAL LOW (ref 20.0–28.0)
Drawn by: 23430
FIO2: 100 %
O2 Saturation: 99.5 %
Patient temperature: 36.5
pCO2 arterial: 33 mmHg (ref 32–48)
pH, Arterial: 7.37 (ref 7.35–7.45)
pO2, Arterial: 110 mmHg — ABNORMAL HIGH (ref 83–108)

## 2022-03-13 LAB — COOXEMETRY PANEL
Carboxyhemoglobin: 1.6 % — ABNORMAL HIGH (ref 0.5–1.5)
Carboxyhemoglobin: 1.7 % — ABNORMAL HIGH (ref 0.5–1.5)
Methemoglobin: 1 % (ref 0.0–1.5)
Methemoglobin: 0.9 % (ref 0.0–1.5)
O2 Saturation: 65.2 %
O2 Saturation: 59.3 %
Total hemoglobin: 10.8 g/dL — ABNORMAL LOW (ref 12.0–16.0)
Total hemoglobin: 8.8 g/dL — ABNORMAL LOW (ref 12.0–16.0)

## 2022-03-13 LAB — HEPATIC FUNCTION PANEL
ALT: 25 U/L (ref 0–44)
AST: 31 U/L (ref 15–41)
Albumin: 3.1 g/dL — ABNORMAL LOW (ref 3.5–5.0)
Alkaline Phosphatase: 112 U/L (ref 38–126)
Bilirubin, Direct: 0.4 mg/dL — ABNORMAL HIGH (ref 0.0–0.2)
Indirect Bilirubin: 1 mg/dL — ABNORMAL HIGH (ref 0.3–0.9)
Total Bilirubin: 1.4 mg/dL — ABNORMAL HIGH (ref 0.3–1.2)
Total Protein: 7 g/dL (ref 6.5–8.1)

## 2022-03-13 LAB — BASIC METABOLIC PANEL
Anion gap: 18 — ABNORMAL HIGH (ref 5–15)
BUN: 29 mg/dL — ABNORMAL HIGH (ref 6–20)
CO2: 18 mmol/L — ABNORMAL LOW (ref 22–32)
Calcium: 8.6 mg/dL — ABNORMAL LOW (ref 8.9–10.3)
Chloride: 97 mmol/L — ABNORMAL LOW (ref 98–111)
Creatinine, Ser: 2.21 mg/dL — ABNORMAL HIGH (ref 0.61–1.24)
GFR, Estimated: 38 mL/min — ABNORMAL LOW (ref >60)
Glucose, Bld: 225 mg/dL — ABNORMAL HIGH (ref 70–99)
Potassium: 3.6 mmol/L (ref 3.5–5.1)
Sodium: 133 mmol/L — ABNORMAL LOW (ref 135–145)

## 2022-03-13 LAB — POCT I-STAT 7, (LYTES, BLD GAS, ICA,H+H)
Acid-base deficit: 4 mmol/L — ABNORMAL HIGH (ref 0.0–2.0)
Acid-base deficit: 1 mmol/L (ref 0.0–2.0)
Bicarbonate: 19.3 mmol/L — ABNORMAL LOW (ref 20.0–28.0)
Bicarbonate: 22.1 mmol/L (ref 20.0–28.0)
Calcium, Ion: 1.1 mmol/L — ABNORMAL LOW (ref 1.15–1.40)
Calcium, Ion: 1.11 mmol/L — ABNORMAL LOW (ref 1.15–1.40)
HCT: 31 % — ABNORMAL LOW (ref 39.0–52.0)
HCT: 33 % — ABNORMAL LOW (ref 39.0–52.0)
Hemoglobin: 10.5 g/dL — ABNORMAL LOW (ref 13.0–17.0)
Hemoglobin: 11.2 g/dL — ABNORMAL LOW (ref 13.0–17.0)
O2 Saturation: 99 %
O2 Saturation: 100 %
Patient temperature: 98.5
Potassium: 4.2 mmol/L (ref 3.5–5.1)
Potassium: 4.5 mmol/L (ref 3.5–5.1)
Sodium: 131 mmol/L — ABNORMAL LOW (ref 135–145)
Sodium: 131 mmol/L — ABNORMAL LOW (ref 135–145)
TCO2: 20 mmol/L — ABNORMAL LOW (ref 22–32)
TCO2: 23 mmol/L (ref 22–32)
pCO2 arterial: 29 mmHg — ABNORMAL LOW (ref 32–48)
pCO2 arterial: 29.9 mmHg — ABNORMAL LOW (ref 32–48)
pH, Arterial: 7.432 (ref 7.35–7.45)
pH, Arterial: 7.476 — ABNORMAL HIGH (ref 7.35–7.45)
pO2, Arterial: 150 mmHg — ABNORMAL HIGH (ref 83–108)
pO2, Arterial: 212 mmHg — ABNORMAL HIGH (ref 83–108)

## 2022-03-13 LAB — CBG MONITORING, ED: Glucose-Capillary: 231 mg/dL — ABNORMAL HIGH (ref 70–99)

## 2022-03-13 LAB — ECHOCARDIOGRAM COMPLETE
AR max vel: 2.62 cm2
AV Area VTI: 2.71 cm2
AV Area mean vel: 2.41 cm2
AV Mean grad: 6 mmHg
AV Peak grad: 12.2 mmHg
Ao pk vel: 1.75 m/s
Area-P 1/2: 6.71 cm2
Height: 70 in
MV M vel: 3.92 m/s
MV Peak grad: 61.5 mmHg
S' Lateral: 6.2 cm
Weight: 3361.57 oz

## 2022-03-13 LAB — CBC
HCT: 34.4 % — ABNORMAL LOW (ref 39.0–52.0)
Hemoglobin: 10.5 g/dL — ABNORMAL LOW (ref 13.0–17.0)
MCH: 27.6 pg (ref 26.0–34.0)
MCHC: 30.5 g/dL (ref 30.0–36.0)
MCV: 90.3 fL (ref 80.0–100.0)
Platelets: 303 10*3/uL (ref 150–400)
RBC: 3.81 MIL/uL — ABNORMAL LOW (ref 4.22–5.81)
RDW: 22.1 % — ABNORMAL HIGH (ref 11.5–15.5)
WBC: 9.2 10*3/uL (ref 4.0–10.5)
nRBC: 0.5 % — ABNORMAL HIGH (ref 0.0–0.2)

## 2022-03-13 LAB — GLUCOSE, CAPILLARY
Glucose-Capillary: 126 mg/dL — ABNORMAL HIGH (ref 70–99)
Glucose-Capillary: 139 mg/dL — ABNORMAL HIGH (ref 70–99)
Glucose-Capillary: 154 mg/dL — ABNORMAL HIGH (ref 70–99)
Glucose-Capillary: 169 mg/dL — ABNORMAL HIGH (ref 70–99)

## 2022-03-13 LAB — PROTIME-INR
INR: 1.6 — ABNORMAL HIGH (ref 0.8–1.2)
Prothrombin Time: 18.9 seconds — ABNORMAL HIGH (ref 11.4–15.2)

## 2022-03-13 LAB — APTT
aPTT: 27 seconds (ref 24–36)
aPTT: 42 seconds — ABNORMAL HIGH (ref 24–36)
aPTT: 37 seconds — ABNORMAL HIGH (ref 24–36)

## 2022-03-13 LAB — SARS CORONAVIRUS 2 BY RT PCR: SARS Coronavirus 2 by RT PCR: NEGATIVE

## 2022-03-13 LAB — MAGNESIUM: Magnesium: 2.3 mg/dL (ref 1.7–2.4)

## 2022-03-13 LAB — BRAIN NATRIURETIC PEPTIDE
B Natriuretic Peptide: 2469 pg/mL — ABNORMAL HIGH (ref 0.0–100.0)
B Natriuretic Peptide: 3264.6 pg/mL — ABNORMAL HIGH (ref 0.0–100.0)

## 2022-03-13 LAB — HEPARIN LEVEL (UNFRACTIONATED): Heparin Unfractionated: 0.54 IU/mL (ref 0.30–0.70)

## 2022-03-13 MED ORDER — NOREPINEPHRINE 4 MG/250ML-% IV SOLN
INTRAVENOUS | Status: AC
  Administered 2022-03-13: 5 ug/min via INTRAVENOUS
  Filled 2022-03-13: qty 250

## 2022-03-13 MED ORDER — ROCURONIUM BROMIDE 50 MG/5ML IV SOLN
70.0000 mg | Freq: Once | INTRAVENOUS | Status: AC
  Filled 2022-03-13: qty 7

## 2022-03-13 MED ORDER — PANTOPRAZOLE 2 MG/ML SUSPENSION
40.0000 mg | Freq: Every day | ORAL | Status: DC
  Administered 2022-03-13 – 2022-03-15 (×3): 40 mg
  Filled 2022-03-13 (×3): qty 20

## 2022-03-13 MED ORDER — ONDANSETRON HCL 4 MG/2ML IJ SOLN
4.0000 mg | Freq: Four times a day (QID) | INTRAMUSCULAR | Status: DC | PRN
  Administered 2022-03-16 – 2022-03-17 (×3): 4 mg via INTRAVENOUS
  Filled 2022-03-13 (×3): qty 2

## 2022-03-13 MED ORDER — HEPARIN (PORCINE) 25000 UT/250ML-% IV SOLN
1300.0000 [IU]/h | INTRAVENOUS | Status: DC
  Administered 2022-03-13: 1300 [IU]/h via INTRAVENOUS
  Filled 2022-03-13: qty 250

## 2022-03-13 MED ORDER — POLYETHYLENE GLYCOL 3350 17 G PO PACK
17.0000 g | PACK | Freq: Every day | ORAL | Status: DC
  Administered 2022-03-14 – 2022-03-15 (×2): 17 g
  Filled 2022-03-13 (×2): qty 1

## 2022-03-13 MED ORDER — INSULIN ASPART 100 UNIT/ML IJ SOLN
0.0000 [IU] | INTRAMUSCULAR | Status: DC
  Administered 2022-03-13: 3 [IU] via SUBCUTANEOUS
  Administered 2022-03-13 – 2022-03-14 (×3): 2 [IU] via SUBCUTANEOUS
  Administered 2022-03-14: 3 [IU] via SUBCUTANEOUS
  Administered 2022-03-14 – 2022-03-16 (×6): 2 [IU] via SUBCUTANEOUS
  Administered 2022-03-16 – 2022-03-17 (×2): 3 [IU] via SUBCUTANEOUS
  Administered 2022-03-17 (×3): 2 [IU] via SUBCUTANEOUS
  Administered 2022-03-17: 5 [IU] via SUBCUTANEOUS
  Administered 2022-03-18 (×3): 2 [IU] via SUBCUTANEOUS
  Administered 2022-03-18: 3 [IU] via SUBCUTANEOUS
  Administered 2022-03-18 – 2022-03-20 (×7): 2 [IU] via SUBCUTANEOUS
  Administered 2022-03-20: 5 [IU] via SUBCUTANEOUS
  Administered 2022-03-20 – 2022-03-21 (×3): 2 [IU] via SUBCUTANEOUS
  Administered 2022-03-21: 3 [IU] via SUBCUTANEOUS
  Administered 2022-03-21: 2 [IU] via SUBCUTANEOUS
  Administered 2022-03-21 – 2022-03-22 (×2): 3 [IU] via SUBCUTANEOUS

## 2022-03-13 MED ORDER — ASPIRIN 81 MG PO TBEC
81.0000 mg | DELAYED_RELEASE_TABLET | Freq: Every day | ORAL | Status: DC
Start: 2022-03-14 — End: 2022-03-14
  Filled 2022-03-13: qty 1

## 2022-03-13 MED ORDER — ETOMIDATE 2 MG/ML IV SOLN
INTRAVENOUS | Status: AC
  Administered 2022-03-13: 30 mg via INTRAVENOUS
  Filled 2022-03-13: qty 20

## 2022-03-13 MED ORDER — AMIODARONE LOAD VIA INFUSION
150.0000 mg | Freq: Once | INTRAVENOUS | Status: AC
  Administered 2022-03-13: 150 mg via INTRAVENOUS
  Filled 2022-03-13: qty 83.34

## 2022-03-13 MED ORDER — DEXMEDETOMIDINE HCL IN NACL 400 MCG/100ML IV SOLN
0.0000 ug/kg/h | INTRAVENOUS | Status: DC
  Administered 2022-03-13: 0.4 ug/kg/h via INTRAVENOUS
  Filled 2022-03-13: qty 100

## 2022-03-13 MED ORDER — ORAL CARE MOUTH RINSE
15.0000 mL | OROMUCOSAL | Status: DC
  Administered 2022-03-13 – 2022-03-16 (×29): 15 mL via OROMUCOSAL

## 2022-03-13 MED ORDER — HYDRALAZINE HCL 20 MG/ML IJ SOLN
INTRAMUSCULAR | Status: AC
  Filled 2022-03-13: qty 1

## 2022-03-13 MED ORDER — ORAL CARE MOUTH RINSE: 15.0000 mL | OROMUCOSAL | Status: DC | PRN

## 2022-03-13 MED ORDER — ROCURONIUM BROMIDE 10 MG/ML (PF) SYRINGE
PREFILLED_SYRINGE | INTRAVENOUS | Status: AC
  Administered 2022-03-13: 70 mg via INTRAVENOUS
  Filled 2022-03-13: qty 10

## 2022-03-13 MED ORDER — ETOMIDATE 2 MG/ML IV SOLN
INTRAVENOUS | Status: AC
  Administered 2022-03-13: 10 mg
  Filled 2022-03-13: qty 10

## 2022-03-13 MED ORDER — MIDAZOLAM HCL 5 MG/5ML IJ SOLN
5.0000 mg | Freq: Once | INTRAMUSCULAR | Status: AC
  Administered 2022-03-13: 5 mg via INTRAVENOUS
  Filled 2022-03-13: qty 5

## 2022-03-13 MED ORDER — AMIODARONE HCL IN DEXTROSE 360-4.14 MG/200ML-% IV SOLN
60.0000 mg/h | INTRAVENOUS | Status: AC
  Administered 2022-03-13: 60 mg/h via INTRAVENOUS
  Filled 2022-03-13 (×2): qty 200

## 2022-03-13 MED ORDER — AMIODARONE HCL IN DEXTROSE 360-4.14 MG/200ML-% IV SOLN: 30.0000 mg/h | INTRAVENOUS | Status: DC

## 2022-03-13 MED ORDER — SODIUM CHLORIDE 0.9 % IV SOLN: INTRAVENOUS | Status: DC | PRN

## 2022-03-13 MED ORDER — SODIUM CHLORIDE 0.9% FLUSH
3.0000 mL | Freq: Two times a day (BID) | INTRAVENOUS | Status: DC
  Administered 2022-03-13 – 2022-03-23 (×12): 3 mL via INTRAVENOUS

## 2022-03-13 MED ORDER — ETOMIDATE 2 MG/ML IV SOLN: 30.0000 mg | Freq: Once | INTRAVENOUS | Status: AC

## 2022-03-13 MED ORDER — FUROSEMIDE 10 MG/ML IJ SOLN
80.0000 mg | Freq: Once | INTRAMUSCULAR | Status: AC
  Administered 2022-03-13: 80 mg via INTRAVENOUS

## 2022-03-13 MED ORDER — SODIUM CHLORIDE 0.9 % IV SOLN: 250.0000 mL | INTRAVENOUS | Status: DC | PRN

## 2022-03-13 MED ORDER — HEPARIN (PORCINE) 25000 UT/250ML-% IV SOLN
2000.0000 [IU]/h | INTRAVENOUS | Status: DC
  Administered 2022-03-14: 1900 [IU]/h via INTRAVENOUS
  Administered 2022-03-14: 1500 [IU]/h via INTRAVENOUS
  Administered 2022-03-15 – 2022-03-17 (×3): 2100 [IU]/h via INTRAVENOUS
  Administered 2022-03-17 – 2022-03-19 (×5): 2000 [IU]/h via INTRAVENOUS
  Filled 2022-03-13 (×11): qty 250

## 2022-03-13 MED ORDER — AMIODARONE IV BOLUS ONLY 150 MG/100ML
150.0000 mg | Freq: Once | INTRAVENOUS | Status: DC
Start: 2022-03-13 — End: 2022-03-13
  Administered 2022-03-13: 150 mg via INTRAVENOUS

## 2022-03-13 MED ORDER — HYDRALAZINE HCL 20 MG/ML IJ SOLN: 10.0000 mg | Freq: Once | INTRAMUSCULAR | Status: DC

## 2022-03-13 MED ORDER — HEPARIN SOD (PORK) LOCK FLUSH 100 UNIT/ML IV SOLN
500.0000 [IU] | Freq: Once | INTRAVENOUS | Status: AC
  Administered 2022-03-13: 500 [IU]
  Filled 2022-03-13: qty 5

## 2022-03-13 MED ORDER — FENTANYL CITRATE PF 50 MCG/ML IJ SOSY: 50.0000 ug | PREFILLED_SYRINGE | Freq: Once | INTRAMUSCULAR | Status: DC

## 2022-03-13 MED ORDER — SODIUM CHLORIDE 0.9% FLUSH: 3.0000 mL | INTRAVENOUS | Status: DC | PRN

## 2022-03-13 MED ORDER — FENTANYL 2500MCG IN NS 250ML (10MCG/ML) PREMIX INFUSION
50.0000 ug/h | INTRAVENOUS | Status: DC
  Administered 2022-03-13: 100 ug/h via INTRAVENOUS
  Administered 2022-03-14 (×2): 150 ug/h via INTRAVENOUS
  Administered 2022-03-15: 175 ug/h via INTRAVENOUS
  Filled 2022-03-13 (×4): qty 250

## 2022-03-13 MED ORDER — AMIODARONE LOAD VIA INFUSION
150.0000 mg | Freq: Once | INTRAVENOUS | Status: DC
Start: 2022-03-13 — End: 2022-03-14
  Filled 2022-03-13: qty 83.34

## 2022-03-13 MED ORDER — NOREPINEPHRINE 4 MG/250ML-% IV SOLN
0.0000 ug/min | INTRAVENOUS | Status: DC
  Administered 2022-03-13: 8 ug/min via INTRAVENOUS
  Administered 2022-03-14 – 2022-03-15 (×2): 2 ug/min via INTRAVENOUS
  Filled 2022-03-13 (×4): qty 250

## 2022-03-13 MED ORDER — ACETAMINOPHEN 325 MG PO TABS: 650.0000 mg | ORAL_TABLET | ORAL | Status: DC | PRN

## 2022-03-13 MED ORDER — ETOMIDATE 2 MG/ML IV SOLN: 20.0000 mg | Freq: Once | INTRAVENOUS | Status: AC

## 2022-03-13 MED ORDER — DOCUSATE SODIUM 50 MG/5ML PO LIQD
100.0000 mg | Freq: Two times a day (BID) | ORAL | Status: DC
  Administered 2022-03-13 – 2022-03-15 (×4): 100 mg
  Filled 2022-03-13 (×4): qty 10

## 2022-03-13 MED ORDER — AMIODARONE HCL IN DEXTROSE 360-4.14 MG/200ML-% IV SOLN
60.0000 mg/h | INTRAVENOUS | Status: DC
  Administered 2022-03-13: 30 mg/h via INTRAVENOUS
  Administered 2022-03-14 (×2): 60 mg/h via INTRAVENOUS
  Administered 2022-03-14: 30 mg/h via INTRAVENOUS
  Administered 2022-03-14 – 2022-03-21 (×23): 60 mg/h via INTRAVENOUS
  Filled 2022-03-13 (×29): qty 200

## 2022-03-13 MED ORDER — MIDAZOLAM HCL 2 MG/2ML IJ SOLN
INTRAMUSCULAR | Status: AC
  Administered 2022-03-13: 2 mg
  Filled 2022-03-13: qty 2

## 2022-03-13 MED ORDER — AMIODARONE LOAD VIA INFUSION: 150.0000 mg | Freq: Once | INTRAVENOUS | Status: DC

## 2022-03-13 MED ORDER — FUROSEMIDE 10 MG/ML IJ SOLN
80.0000 mg | Freq: Once | INTRAMUSCULAR | Status: AC
  Administered 2022-03-13: 80 mg via INTRAVENOUS
  Filled 2022-03-13: qty 8

## 2022-03-13 MED ORDER — FENTANYL CITRATE (PF) 100 MCG/2ML IJ SOLN
100.0000 ug | Freq: Once | INTRAMUSCULAR | Status: AC
  Administered 2022-03-13: 100 ug via INTRAVENOUS

## 2022-03-13 MED ORDER — HEPARIN BOLUS VIA INFUSION
2500.0000 [IU] | Freq: Once | INTRAVENOUS | Status: AC
  Administered 2022-03-13: 2500 [IU] via INTRAVENOUS
  Filled 2022-03-13: qty 2500

## 2022-03-13 MED ORDER — AMIODARONE HCL IN DEXTROSE 360-4.14 MG/200ML-% IV SOLN: 60.0000 mg/h | INTRAVENOUS | Status: DC

## 2022-03-13 MED ORDER — MILRINONE LACTATE IN DEXTROSE 20-5 MG/100ML-% IV SOLN
0.5000 ug/kg/min | INTRAVENOUS | Status: DC
  Administered 2022-03-13 – 2022-03-23 (×34): 0.5 ug/kg/min via INTRAVENOUS
  Filled 2022-03-13 (×35): qty 100

## 2022-03-13 MED ORDER — FUROSEMIDE 10 MG/ML IJ SOLN
INTRAMUSCULAR | Status: AC
  Filled 2022-03-13: qty 8

## 2022-03-13 MED ORDER — DEXTROSE 5 % IV SOLN
12.0000 mg/h | INTRAVENOUS | Status: DC
  Administered 2022-03-13 – 2022-03-14 (×2): 12 mg/h via INTRAVENOUS
  Filled 2022-03-13 (×3): qty 20

## 2022-03-13 MED ORDER — FENTANYL BOLUS VIA INFUSION: 50.0000 ug | INTRAVENOUS | Status: DC | PRN

## 2022-03-13 MED ORDER — FENTANYL 2500MCG IN NS 250ML (10MCG/ML) PREMIX INFUSION
0.0000 ug/h | INTRAVENOUS | Status: DC
  Administered 2022-03-13: 25 ug/h via INTRAVENOUS
  Filled 2022-03-13: qty 250

## 2022-03-13 MED ORDER — AMIODARONE LOAD VIA INFUSION
150.0000 mg | Freq: Once | INTRAVENOUS | Status: DC
  Filled 2022-03-13: qty 83.34

## 2022-03-13 NOTE — ED Notes (Signed)
OG tube attempted x2  and Dr. Langston Masker advised to hold off on placement for now.

## 2022-03-13 NOTE — Progress Notes (Signed)
  Amiodarone Drug - Drug Interaction Consult Note  Recommendations: Patient on diuretic, torsemide. Monitor K+ to ensure maintains K > or = 4.   Amiodarone is metabolized by the cytochrome P450 system and therefore has the potential to cause many drug interactions. Amiodarone has an average plasma half-life of 50 days (range 20 to 100 days).   There is potential for drug interactions to occur several weeks or months after stopping treatment and the onset of drug interactions may be slow after initiating amiodarone.   '[]'$  Statins: Increased risk of myopathy. Simvastatin- restrict dose to '20mg'$  daily. Other statins: counsel patients to report any muscle pain or weakness immediately.  '[]'$  Anticoagulants: Amiodarone can increase anticoagulant effect. Consider warfarin dose reduction. Patients should be monitored closely and the dose of anticoagulant altered accordingly, remembering that amiodarone levels take several weeks to stabilize.  '[]'$  Antiepileptics: Amiodarone can increase plasma concentration of phenytoin, the dose should be reduced. Note that small changes in phenytoin dose can result in large changes in levels. Monitor patient and counsel on signs of toxicity.  '[]'$  Beta blockers: increased risk of bradycardia, AV block and myocardial depression. Sotalol - avoid concomitant use.  '[]'$   Calcium channel blockers (diltiazem and verapamil): increased risk of bradycardia, AV block and myocardial depression.  '[]'$   Cyclosporine: Amiodarone increases levels of cyclosporine. Reduced dose of cyclosporine is recommended.  '[]'$  Digoxin dose should be halved when amiodarone is started.  '[x]'$  Diuretics: increased risk of cardiotoxicity if hypokalemia occurs.  '[]'$  Oral hypoglycemic agents (glyburide, glipizide, glimepiride): increased risk of hypoglycemia. Patient's glucose levels should be monitored closely when initiating amiodarone therapy.   '[]'$  Drugs that prolong the QT interval:  Torsades de pointes risk  may be increased with concurrent use - avoid if possible.  Monitor QTc, also keep magnesium/potassium WNL if concurrent therapy can't be avoided.  Antibiotics: e.g. fluoroquinolones, erythromycin.  Antiarrhythmics: e.g. quinidine, procainamide, disopyramide, sotalol.  Antipsychotics: e.g. phenothiazines, haloperidol.   Lithium, tricyclic antidepressants, and methadone. Thank You,   Isac Sarna L  03/13/2022 11:38 AM

## 2022-03-13 NOTE — ED Notes (Signed)
Home infusion of Milrinone stopped and clamped.

## 2022-03-13 NOTE — Progress Notes (Addendum)
ANTICOAGULATION CONSULT NOTE - Follow Up Consult  Pharmacy Consult for heparin Indication: atrial fibrillation  No Known Allergies  Patient Measurements: Height: '5\' 10"'$  (177.8 cm) Weight: 95.3 kg (210 lb 1.6 oz) IBW/kg (Calculated) : 73 Heparin Dosing Weight: 92.5kg  Vital Signs: Temp: 97.6 F (36.4 C) (09/01 1500) Temp Source: Axillary (09/01 1500) BP: 125/102 (09/01 1500) Pulse Rate: 87 (09/01 1825)  Labs: Recent Labs    03/13/22 1007 03/13/22 1151 03/13/22 1152 03/13/22 1410 03/13/22 1452 03/13/22 1806 03/13/22 1825  HGB 10.5*  --   --   --  10.5* 11.2*  --   HCT 34.4*  --   --   --  31.0* 33.0*  --   PLT 303  --   --   --   --   --   --   APTT  --  27  --  42*  --   --  37*  LABPROT  --   --   --  18.9*  --   --   --   INR  --   --   --  1.6*  --   --   --   HEPARINUNFRC  --   --  0.54  --   --   --   --   CREATININE 2.21*  --   --  2.45*  --   --   --     Estimated Creatinine Clearance: 46.4 mL/min (A) (by C-G formula based on SCr of 2.45 mg/dL (H)).   Assessment: 41 yo M w/ PMH afib who was prescribed apixaban at time of last discharge (but received therapy during his inpatient stay). Patient was unable to pick up outpatient Rx and therefore last dose was during his admission 8/29 (>48h).   aPTT 37 (subtherapeutic)  Goal of Therapy:  Heparin level 0.3-0.7 units/ml aPTT 66-102s Monitor platelets by anticoagulation protocol: Yes   Plan:  Heparin bolus 2500 IV x1 then  Increase heparin infusion rate to 1500 units/hr 6hr HL at 0200 Continue to monitor CBC, daily HL, and s/sx bleeding       Wilson Singer, PharmD Clinical Pharmacist 03/13/2022 7:31 PM

## 2022-03-13 NOTE — ED Triage Notes (Signed)
Pt to the ED with complaints of chest pain after being discharged from [facility].  The pt had open heart surgery during his recent [place] admission.  Pt is in Afib with RVR, has HTN, and 2 plus pitting edema in his lower extremities.  Pt also has crackles in all lobes and is on a NRB at 15L O2

## 2022-03-13 NOTE — H&P (Signed)
Cardiology H&P   Patient ID: [name] [name] MRN: [id number]; DOB: 1980/09/05  Admit date: 03/13/2022  PCP:  [name] Sites, MD   [facility] Prov[facility] Cardiologist:  [name] Bickers, MD   {   Patient Profile:   [name] [name] is a 41 y.o. male with a hx of HFrEF (EF down to 20% in 2013, improved to 45% in 09/2019, down to 20-25% in 12/2021), HTN, PVC's, MR and AI (s/p prior AV repair in 04/2017 with complex valvuloplasty including plication of left coronary leaflet and aortic ring annuloplasty, recurrent severe AI by TEE in 01/2022) who is being seen 03/13/2022 for the evaluation of CHF and atrial fibrillation with RVR at the request of Dr. Langston [name].  History of Present Illness:   [name] [name] has experienced multiple admissions over the past several months.  He was admitted for an acute CHF exacerbation in 01/2022 and required IV Lasix and Milrinone during admission. Was ultimately discharged on Milrinone on 02/12/2022 but was evaluated in clinic on 02/19/2022 and there was concern he was in cardiogenic shock, therefore he was again admitted to the Advanced Heart Failure service. He was initially on Milrinone and IV Lasix but had minimal urine output, therefore Norepinephrine was added with minimal response. Underwent RHC on 02/19/2022 which showed severe biventricular failure with markedly elevated volume status and low output. Underwent placement of IABP and Swan.  Also developed atrial fibrillation with RVR and was started on IV Amiodarone with conversion to normal sinus rhythm. However, he developed respiratory distress and required intubation. Was ultimately transferred to [facility] on 02/20/2022 for consideration of transplant.   By review of Care Everywhere, he was admitted to [facility] from 8/11 - 03/10/2022. It appears there was no ideal temporary option and given his severe AI, it was recommended to proceed with SAVR and assess candidacy for MCS vs. transplant afterwards. He underwent  aortic valve replacement on 02/22/2022 with 27 mm Inspiris. He did have an attempt at [place] on 02/26/2022 but this was unsuccessful and rate control was pursued at that time. However, notes mention he did convert to normal sinus rhythm on 03/06/2022. It was recommended to continue Amiodarone and Eliquis for anticoagulation. He was continued on home Milrinone at the time of discharge and a LifeVest was also placed. He did have an AKI with creatinine up to 2.7 but had improved to 1.6 on 03/10/2022.  Was restarted on Torsemide 40 mg twice daily and Spironolactone 12.5 mg daily with plans to add an SGLT2 inhibitor as an outpatient.   He presented to [facility] ED this morning for evaluation of worsening dyspnea and was initially placed on 15 L NRB but due to worsening respiratory distress, was intubated in the ED. In discussion with the ED provider, his wife did report he had missed Eliquis since the time of hospital discharge as they were unable to get his medications filled at his local pharmacy.  Initial labs show WBC 9.2, Hgb 10.5, platelets 303, Na+ 133, K+ 3.6 and creatinine 2.21. BNP 2469.  CXR showing increased bilateral lung opacities.  EKG not yet obtained but telemetry shows atrial fibrillation with RVR, heart rate variable from the 130s to 150s.  Past Medical History:  Diagnosis Date   Anxiety    CHF (congestive heart failure) (HCC)    Chronic systolic heart failure (HCC)    CKD (chronic kidney disease) stage 2, GFR 60-89 ml/min    Dyspnea    with value issues- "if I dont take my  medication"   Essential hypertension, benign    Headache    History of pneumonia    Mitral regurgitation    Moderate   Noncompliance    Nonischemic cardiomyopathy ([place])    Normal coronaries May 2012, LVEF < 20%   Pneumonia 2011   S/P aortic valve repair 05/11/2017   Complex valvuloplasty including plication of left coronary leaflet and 62m Biostable HAART ring annuloplasty    Past Surgical History:  Procedure  Laterality Date   AORTIC VALVE REPAIR N/A 05/11/2017   Procedure: AORTIC VALVE REPAIR;  Surgeon: ORexene Alberts MD;  [place] [facility]  Service: Open Heart Surgery;  Laterality: N/A;   CARDIAC SURGERY     IABP INSERTION N/A 02/19/2022   Procedure: IABP Insertion;  Surgeon: BJolaine Artist MD;  [facility];  Service: Cardiovascular;  Laterality: N/A;   RIGHT HEART CATH N/A 02/19/2022   Procedure: RIGHT HEART CATH;  Surgeon: BJolaine Artist MD;  [place] [facility];  Service: Cardiovascular;  Laterality: N/A;   RIGHT/LEFT HEART CATH AND CORONARY ANGIOGRAPHY N/A 04/20/2017   Procedure: RIGHT/LEFT HEART CATH AND CORONARY ANGIOGRAPHY;  Surgeon: BJolaine Artist MD;  [facility];  Service: Cardiovascular;  Laterality: N/A;   SURGERY SCROTAL / TESTICULAR     Testicular torsion   TEE WITHOUT CARDIOVERSION N/A 04/09/2017   Procedure: TRANSESOPHAGEAL ECHOCARDIOGRAM (TEE);  Surgeon: BJolaine Artist MD;  [facility];  Service: Cardiovascular;  Laterality: N/A;   TEE WITHOUT CARDIOVERSION N/A 05/11/2017   Procedure: TRANSESOPHAGEAL ECHOCARDIOGRAM (TEE);  Surgeon: ORexene Alberts MD;  [place] [facility]  Service: Open Heart Surgery;  Laterality: N/A;   TEE WITHOUT CARDIOVERSION N/A 01/20/2022   Procedure: TRANSESOPHAGEAL ECHOCARDIOGRAM (TEE);  Surgeon: BJolaine Artist MD;  [facility];  Service: Cardiovascular;  Laterality: N/A;     Home Medications:  Prior to Admission medications   Medication Sig Start Date End Date Taking? Authorizing Provider  allopurinol (ZYLOPRIM) 300 MG tablet Take 300 mg by mouth daily. 01/13/22  Yes [provider]  ALPRAZolam (Duanne Moron 0.5 MG tablet Take 0.5 mg by mouth at bedtime as needed for anxiety or sleep.   Yes [provider]  amiodarone (PACERONE) 200 MG tablet Take 200 mg by mouth daily. 03/10/22  Yes [provider]  aspirin EC 81 MG tablet Take 81 mg by mouth  daily. Swallow whole.   Yes [provider]  milrinone (PRIMACOR) 20 MG/100 ML SOLN infusion Inject 0.0486 mg/min into the vein continuous. 02/20/22  Yes Clegg, Amy D, NP  polyethylene glycol (MIRALAX / GLYCOLAX) 17 g packet Take 17 g by mouth daily as needed for mild constipation. 03/10/22  Yes [provider]  potassium chloride (KLOR-CON M) 10 MEQ tablet Take 10 mEq by mouth daily. 03/10/22  Yes [provider]  torsemide (DEMADEX) 20 MG tablet Take 40 mg by mouth in the morning and at bedtime. 03/10/22 04/09/22 Yes [provider]  apixaban (ELIQUIS) 5 MG TABS tablet Take 5 mg by mouth 2 times daily at 12 noon and 4 pm. 03/10/22 06/08/22  [provider]  oxyCODONE (OXY IR/ROXICODONE) 5 MG immediate release tablet Take 5 mg by mouth every 6 (six) hours as needed for moderate pain. 03/10/22 03/17/22  [provider]  spironolactone (ALDACTONE) 25 MG tablet Take 0.5 tablets by mouth daily. 03/11/22 03/11/23  [provider]    Inpatient Medications: Scheduled Meds:   Continuous Infusions:  amiodarone 60  mg/hr (03/13/22 1013)   Followed by   amiodarone     fentaNYL infusion INTRAVENOUS 25 mcg/hr (03/13/22 1026)   milrinone     PRN Meds:   Allergies:   No Known Allergies  Social History:   Social History   Socioeconomic History   Marital status: Married    Spouse name: RaShannon   Number of children: 2   Years of education: Not on file   Highest education level: Some college, no degree  Occupational History    Employer: [employer]  Tobacco Use   Smoking status: Never   Smokeless tobacco: Never  Vaping Use   Vaping Use: Never used  Substance and Sexual Activity   Alcohol use: Yes    Alcohol/week: 0.0 standard drinks of alcohol    Comment: occasional   Drug use: No   Sexual activity: Yes    Birth control/protection: Condom  Other Topics Concern   Not on file  Social History Narrative   Occupation: just  started sanitation job cleaning      Patient is right-handed. He lives with his wife in a 1 story house. He drinks 1-2 sodas a day.    Social Determinants of Health   Financial Resource Strain: Not on file  Food Insecurity: Not on file  Transportation Needs: No Transportation Needs (09/28/2019)   PRAPARE - Hydrologist (Medical): No    Lack of Transportation (Non-Medical): No  Physical Activity: Insufficiently Active (09/28/2019)   Exercise Vital Sign    Days of Exercise per Week: 4 days    Minutes of Exercise per Session: 30 min  Stress: Not on file  Social Connections: Not on file  Intimate Partner Violence: Not on file    Family History:    Family History  Problem Relation Age of Onset   Breast cancer Mother    Stroke Father    Hypertension Father      ROS:   Unable to be obtained. Patient intubated and sedated.   Physical Exam/Data:   Vitals:   03/13/22 1030 03/13/22 1032 03/13/22 1043 03/13/22 1045  BP: (!) 116/99 (!) 120/94  (!) 132/111  Pulse: 78 (!) 116 (!) 117 (!) 116  Resp: (!) 24 (!) 24 (!) 24 (!) 25  Temp:   97.8 F (36.6 C)   TempSrc:   Axillary   SpO2: 96% 95% 94% 95%  Weight:      Height:       No intake or output data in the 24 hours ending 03/13/22 1121    03/13/2022    9:28 AM 02/20/2022    6:00 AM 02/19/2022   11:24 AM  Last 3 Weights  Weight (lbs) 210 lb 1.6 oz 210 lb 1.6 oz 214 lb 1.1 oz  Weight (kg) 95.3 kg 95.3 kg 97.1 kg     Body mass index is 30.15 kg/m.  General: Critically-ill appearing male.  HEENT: Intubated.  Neck: JVD elevated to jawline.  Vascular: No carotid bruits; Distal pulses 2+ bilaterally Cardiac: Irregularly irregular.  Lungs: rales along bases bilaterally. Currently intubated.  Abd: soft, nontender, no hepatomegaly  Ext: 2+ pitting edema bilaterally. Lower extremities cool to touch.  Musculoskeletal:  No deformities, BUE and BLE strength normal and equal Skin: Diaphoretic Psych: Unable  to assess. Intubated and sedated.   Relevant CV Studies:  Echocardiogram: 12/2021 IMPRESSIONS     1. Left ventricular ejection fraction, by estimation, is 20 to 25%. The  left ventricle has severely decreased function. The  left ventricle  demonstrates global hypokinesis. The left ventricular internal cavity size  was severely dilated. Indeterminate  diastolic filling due to E-A fusion.   2. Right ventricular systolic function is severely reduced. The right  ventricular size is moderately enlarged. There is normal pulmonary artery  systolic pressure. The estimated right ventricular systolic pressure is  09.3 mmHg.   3. Left atrial size was severely dilated.   4. Right atrial size was severely dilated.   5. The mitral valve is grossly normal. Moderate to severe mitral valve  regurgitation. No evidence of mitral stenosis.   6. S/p Aortic Valve Repair with 19 mm annuloplasty ring (05/11/2017).  There is residual aortic regurgitation that is severe and eccentric. V max  2.9 m/s, MG 16 mmHG. Suspect this is related to AI. Recommend TEE for  further characterization. The aortic  valve has been repaired/replaced. Aortic valve regurgitation is severe.  There is a 19 mm valve present in the aortic position. Procedure Date:  05/11/2017.   7. Aortic dilatation noted. Aneurysm of the ascending aorta, measuring 47  mm. There is severe dilatation of the aortic root, measuring 44 mm.   8. The inferior vena cava is normal in size with <50% respiratory  variability, suggesting right atrial pressure of 8 mmHg.   Comparison(s): Changes from prior study are noted. EF now 20-25%. Severe  AI.   RHC: 02/2022 Findings:   On milrinone 0.5 and NE 3   RA = 18 RV = 62/23 PA = 67/37 (50) PCW = 33 (v = 40-45) Fick cardiac output/index = 3.6/1.7 Thermo CO/CI 3.5/1.6 PVR = 4.8 SVR = 2,000 FA sat = 85% PA sat = 47%, 51% PAPi = 1.7   Assessment: 1. Severe biventricular HF with markedly elevated  volume status and low ouput 2. Successful placement of IABP and Swan   Plan/Discussion:    Continue current support. Watch hemodynamics closely in setting of IABP and significant AI. Diurese.   Once stabilized will transport to [facility] for transplant evaluation. If decompensated will need Impella 5.5 +/- Protek RV support.     Laboratory Data:  High Sensitivity Troponin:  No results for input(s): "TROPONINIHS" in the last 720 hours.   Chemistry Recent Labs  Lab 03/13/22 1007  NA 133*  K 3.6  CL 97*  CO2 18*  GLUCOSE 225*  BUN 29*  CREATININE 2.21*  CALCIUM 8.6*  MG 2.3  GFRNONAA 38*  ANIONGAP 18*    No results for input(s): "PROT", "ALBUMIN", "AST", "ALT", "ALKPHOS", "BILITOT" in the last 168 hours. Lipids No results for input(s): "CHOL", "TRIG", "HDL", "LABVLDL", "LDLCALC", "CHOLHDL" in the last 168 hours.  Hematology Recent Labs  Lab 03/13/22 1007  WBC 9.2  RBC 3.81*  HGB 10.5*  HCT 34.4*  MCV 90.3  MCH 27.6  MCHC 30.5  RDW 22.1*  PLT 303   Thyroid No results for input(s): "TSH", "FREET4" in the last 168 hours.  BNP Recent Labs  Lab 03/13/22 1007  BNP 2,469.0*    DDimer No results for input(s): "DDIMER" in the last 168 hours.   Radiology/Studies:  DG Chest Portable 1 View  Result Date: 03/13/2022 CLINICAL DATA:  Status post intubation. EXAM: PORTABLE CHEST 1 VIEW COMPARISON:  February 20, 2022. FINDINGS: Stable cardiomegaly. Large amount of overlying external objects is noted obscuring evaluation of the chest. Endotracheal tube appears to be in grossly good position. Increased bilateral lung opacities are noted concerning for edema or multifocal inflammation. Small pleural effusions may be present. Bony  thorax is unremarkable. Status post aortic valve repair. IMPRESSION: Significantly limited exam due to overlying external objects, although the endotracheal tube appears to be in grossly good position. Increased bilateral lung opacities as described above.  Electronically Signed   By: Marijo Conception M.D.   On: 03/13/2022 10:08     Assessment and Plan:   1. Acute HFrEF/Biventricular Failure/Cardiogenic Shock - He has a known cardiomyopathy and recent TEE on 02/26/2022 showed severe LV dysfunction with EF at 20% and moderate RV dysfunction. Recently underwent AVR as outlined above was discharged from [facility] on 03/10/2022 but presented in worsening respiratory distress today. - BNP elevated to 469 and CXR consistent with CHF. He was intubated in the ED and will require Critical Care consult for management of his vent upon arrival to [facility]. - He just received IV Lasix 80 mg at the time of this encounter and anticipate he will need additional dosing later this afternoon pending his response. Continue home Milrinone for now and Co-ox is pending. Will obtain a follow-up echocardiogram for reassessment of his valve once at [facility]. Will hold Spironolactone for now in the setting of shock and AKI. Can titrate GDMT later this admission pending improvement in his clinical status.   2. Severe AI - He is s/p prior AV repair in 04/2017 with complex valvuloplasty including plication of left coronary leaflet and aortic ring annuloplasty, recurrent severe AI by TEE in 01/2022.  - Underwent AVR at [facility] on 02/22/2022 with 27 mm Inspiris. Will obtain a repeat echocardiogram.   3. Mitral Regurgitation - Read as mild by recent TEE at [facility] on 02/26/2022.  Continue to follow.  4. Atrial Fibrillation with RVR - He had atrial fibrillation with RVR during his recent admission at [facility] but converted to normal sinus rhythm with IV Amiodarone. He is back in atrial fibrillation with RVR at this time and has been restarted on IV Amiodarone. - Listed as being on Eliquis prior to admission but his wife did tell the ED provider that he had missed doses since being home. Will start Heparin at this time. If his respiratory status does not improve with diuresis, would need to consider a CTA  once renal function improves.  5. AKI - Baseline creatinine 1.6 - 1.7.  Peaked at 2.7 during his recent admission but had improved to 1.6 at the time of discharge  Elevated to 2.21 today. Follow with diuresis.   For questions or updates, please contact [facility] Please consult [email or web address] for contact info under    Signed, Erma Heritage, PA-C  03/13/2022 11:21 AM

## 2022-03-13 NOTE — Consult Note (Signed)
NAME:  [name] [name], MRN:  [id number], DOB:  [birth year], LOS: 0 ADMISSION DATE:  03/13/2022, CONSULTATION DATE:  03/13/2022 REFERRING MD:  Dr. Haroldine Laws, CHIEF COMPLAINT:  Chest pain    History of Present Illness:  [name] [name] is a 41 y.o. male with a PMH significant for systolic congestive heart failure, CKD stage II, hypertension, hyperlipidemia, status post aortic valve repair, mitral regurgitation, and anxiety who presented to [facility] emergency department 9/1 for complaints of shortness of breath and palpitations.    Family states patient was discharged from [facility] 2 days prior to admission for possible aortic valve replacement, patient was discharged home on milrinone through PICC line.   ED presentation patient is seen in acute respiratory distress requiring 15 L normal at which time decision was made to intubate evaluate.  Patient Was Also Seen with Worsening AKI with Creatinine 2.21 and Elevated BNP of 2469.  Patient Was Transferred to [facility] for closer Heart Failure Evaluation and Management.  Given the Need for Ventilator Support PCCM is consulted as well  Pertinent  Medical History  Systolic congestive heart failure, CKD stage II, hypertension, hyperlipidemia, status post aortic valve repair, mitral regurgitation, and anxiety   Significant Hospital Events: Including procedures, antibiotic start and stop dates in addition to other pertinent events   9/1 for shortness of breath and palpitations found in decompensated heart failure requiring intubation greater than protection  Interim History / Subjective:  Incidental  Objective   Blood pressure (!) 135/115, pulse (!) 124, temperature 97.8 F (36.6 C), temperature source Axillary, resp. rate (!) 30, height '5\' 10"'$  (1.778 m), weight 95.3 kg, SpO2 100 %.    Vent Mode: PRVC FiO2 (%):  [100 %] 100 % Set Rate:  [24 bmp] 24 bmp Vt Set:  [550 mL-580 mL] 580 mL PEEP:  [5 cmH20] 5 cmH20 Plateau Pressure:  [17  cmH20-23 cmH20] 17 cmH20  No intake or output data in the 24 hours ending 03/13/22 1402 Filed Weights   03/13/22 0928  Weight: 95.3 kg    Examination: General: Acute ill appearing adult male lying in bed on mechanical ventilation, in NAD HEENT: ETT, MM pink/moist, PERRL,  Neuro: Sedated on vent  CV: s1s2 regular rate and rhythm, no murmur, rubs, or gallops,  PULM: No increased work of breathing, tolerating ventilator, no distress symptoms GI: soft, bowel sounds active in all 4 quadrants, non-tender, non-distended Extremities: warm/dry, no edema  Skin: no rashes or lesions   Resolved Hospital Problem list     Assessment & Plan:  Cardiogenic shock in the setting of biventricular heart failure -Patient recently underwent TEE 02/26/2022 which revealed severe LV dysfunction with EF 20%.  Severe aortic insufficiency -Recently underwent aortic valve replacement due 03/10/2022 Mitral regurgitation Atrial fibrillation with rapid ventricular response P: Continuous telemetry  Trend BNP Strict intake and output  Daily weight to assess volume status Daily assessment for need to diurese  Closely monitor renal function and electrolytes  Supplemental oxygen as below Ensure hemodynamic control Continue IV amiodarone and  IV heparin drip  Acute Hypoxic Respiratory Failure Pulmonary edema   -In the setting of cardiogenic shock P: Continue ventilator support with lung protective strategies  Wean PEEP and FiO2 for sats greater than 90%. Head of bed elevated 30 degrees. Plateau pressures less than 30 cm H20.  Follow intermittent chest x-ray and ABG.   SAT/SBT as tolerated, mentation preclude extubation  Ensure adequate pulmonary hygiene  Follow cultures  VAP bundle in place  PAD protocol  Acute Kidney Injury superimposed on CKD stage II. -Baseline creatinine 1.6-1.7, presented with creatinine 2.21 P: Follow renal function Monitor  urine output Trend Bmet Avoid nephrotoxins Ensure  adequate renal perfusion   Hyperglycemia  P: SSI  CBG goal 140-180 Check CBG q3  Best Practice (right click and "Reselect all SmartList Selections" daily)   Diet/type: NPO DVT prophylaxis: systemic heparin GI prophylaxis: PPI Lines: N/A Foley:  N/A Code Status:  full code Last date of multidisciplinary goals of care discussion: per primary   Labs   CBC: Recent Labs  Lab 03/13/22 1007  WBC 9.2  HGB 10.5*  HCT 34.4*  MCV 90.3  PLT 740    Basic Metabolic Panel: Recent Labs  Lab 03/13/22 1007  NA 133*  K 3.6  CL 97*  CO2 18*  GLUCOSE 225*  BUN 29*  CREATININE 2.21*  CALCIUM 8.6*  MG 2.3   GFR: Estimated Creatinine Clearance: 51.5 mL/min (A) (by C-G formula based on SCr of 2.21 mg/dL (H)). Recent Labs  Lab 03/13/22 1007  WBC 9.2    Liver Function Tests: Recent Labs  Lab 03/13/22 1009  AST 31  ALT 25  ALKPHOS 112  BILITOT 1.4*  PROT 7.0  ALBUMIN 3.1*   No results for input(s): "LIPASE", "AMYLASE" in the last 168 hours. No results for input(s): "AMMONIA" in the last 168 hours.  ABG    Component Value Date/Time   PHART 7.37 03/13/2022 1125   PCO2ART 33 03/13/2022 1125   PO2ART 110 (H) 03/13/2022 1125   HCO3 19.2 (L) 03/13/2022 1125   TCO2 24 02/20/2022 0535   ACIDBASEDEF 5.4 (H) 03/13/2022 1125   O2SAT 99.5 03/13/2022 1125     Coagulation Profile: No results for input(s): "INR", "PROTIME" in the last 168 hours.  Cardiac Enzymes: No results for input(s): "CKTOTAL", "CKMB", "CKMBINDEX", "TROPONINI" in the last 168 hours.  HbA1C: Hgb A1c MFr Bld  Date/Time Value Ref Range Status  02/09/2022 06:24 AM 6.6 (H) 4.8 - 5.6 % Final    Comment:    (NOTE) Pre diabetes:          5.7%-6.4%  Diabetes:              >6.4%  Glycemic control for   <7.0% adults with diabetes   08/19/2021 02:51 PM 6.0 (H) 4.8 - 5.6 % Final    Comment:    (NOTE) Pre diabetes:          5.7%-6.4%  Diabetes:              >6.4%  Glycemic control for    <7.0% adults with diabetes     CBG: Recent Labs  Lab 03/13/22 0927  GLUCAP 231*    Review of Systems:   Unable to assess   Past Medical History:  He,  has a past medical history of Anxiety, CHF (congestive heart failure) ([place]), Chronic systolic heart failure ([place]), CKD (chronic kidney disease) stage 2, GFR 60-89 ml/min, Dyspnea, Essential hypertension, benign, Headache, History of pneumonia, Mitral regurgitation, Noncompliance, Nonischemic cardiomyopathy ([place]), Pneumonia (2011), and S/P aortic valve repair (05/11/2017).   Surgical History:   Past Surgical History:  Procedure Laterality Date   AORTIC VALVE REPAIR N/A 05/11/2017   Procedure: AORTIC VALVE REPAIR;  Surgeon: Rexene Alberts, MD;  Location: [place];  Service: Open Heart Surgery;  Laterality: N/A;   CARDIAC SURGERY     IABP INSERTION N/A 02/19/2022   Procedure: IABP Insertion;  Surgeon: Jolaine Artist, MD;  Location: [place] [facility];  Service: Cardiovascular;  Laterality: N/A;   RIGHT HEART CATH N/A 02/19/2022   Procedure: RIGHT HEART CATH;  Surgeon: Jolaine Artist, MD;  Location: [place] [facility];  Service: Cardiovascular;  Laterality: N/A;   RIGHT/LEFT HEART CATH AND CORONARY ANGIOGRAPHY N/A 04/20/2017   Procedure: RIGHT/LEFT HEART CATH AND CORONARY ANGIOGRAPHY;  Surgeon: Jolaine Artist, MD;  Location: [place] [facility];  Service: Cardiovascular;  Laterality: N/A;   SURGERY SCROTAL / TESTICULAR     Testicular torsion   TEE WITHOUT CARDIOVERSION N/A 04/09/2017   Procedure: TRANSESOPHAGEAL ECHOCARDIOGRAM (TEE);  Surgeon: Jolaine Artist, MD;  Location: [facility];  Service: Cardiovascular;  Laterality: N/A;   TEE WITHOUT CARDIOVERSION N/A 05/11/2017   Procedure: TRANSESOPHAGEAL ECHOCARDIOGRAM (TEE);  Surgeon: Rexene Alberts, MD;  Location: [place];  Service: Open Heart Surgery;  Laterality: N/A;   TEE WITHOUT CARDIOVERSION N/A 01/20/2022   Procedure: TRANSESOPHAGEAL ECHOCARDIOGRAM (TEE);  Surgeon:  Jolaine Artist, MD;  Location: [facility] END[facility];  Service: Cardiovascular;  Laterality: N/A;     Social History:   reports that he has never smoked. He has never used smokeless tobacco. He reports current alcohol use. He reports that he does not use drugs.   Family History:  His family history includes Breast cancer in his mother; Hypertension in his father; Stroke in his father.   Allergies No Known Allergies   Home Medications  Prior to Admission medications   Medication Sig Start Date End Date Taking? Authorizing Provider  allopurinol (ZYLOPRIM) 300 MG tablet Take 300 mg by mouth daily. 01/13/22  Yes [provider]  ALPRAZolam Duanne Moron) 0.5 MG tablet Take 0.5 mg by mouth at bedtime as needed for anxiety or sleep.   Yes [provider]  amiodarone (PACERONE) 200 MG tablet Take 200 mg by mouth daily. 03/10/22  Yes [provider]  aspirin EC 81 MG tablet Take 81 mg by mouth daily. Swallow whole.   Yes [provider]  milrinone (PRIMACOR) 20 MG/100 ML SOLN infusion Inject 0.0486 mg/min into the vein continuous. 02/20/22  Yes Clegg, Amy D, NP  polyethylene glycol (MIRALAX / GLYCOLAX) 17 g packet Take 17 g by mouth daily as needed for mild constipation. 03/10/22  Yes [provider]  potassium chloride (KLOR-CON M) 10 MEQ tablet Take 10 mEq by mouth daily. 03/10/22  Yes [provider]  torsemide (DEMADEX) 20 MG tablet Take 40 mg by mouth in the morning and at bedtime. 03/10/22 04/09/22 Yes [provider]  apixaban (ELIQUIS) 5 MG TABS tablet Take 5 mg by mouth 2 times daily at 12 noon and 4 pm. 03/10/22 06/08/22  [provider]  oxyCODONE (OXY IR/ROXICODONE) 5 MG immediate release tablet Take 5 mg by mouth every 6 (six) hours as needed for moderate pain. 03/10/22 03/17/22  [provider]  spironolactone (ALDACTONE) 25 MG tablet Take 0.5 tablets by mouth daily. 03/11/22 03/11/23  [provider]     Critical care  time: 42 min  Kensleigh Gates D. Kenton Kingfisher, NP-C [facility] Personal contact information can be found on Amion  03/13/2022, 2:24 PM

## 2022-03-13 NOTE — Consult Note (Addendum)
Advanced Heart Failure Team Consult Note   Primary Physician: Sharilyn Sites, MD PCP-Cardiologist:  Glori Bickers, MD  Reason for Consultation: Cardiogenic Shock   HPI:    [name] [name] is seen today for evaluation of cardiogenic shock at the request of Dr Debara Pickett  [name] [name] is a 69 year with a history of HFrEF/ NICM dating back to 2012 which had improved in 2013. HF meds stopped and had recurrent HFrEF 2013 with EF down to 20%. Also has HTN, PVCs, [name], PAF, Aortic Insufficiency,  aortic valve repair 2018 (with 41m annuloplasty ring), severe biventricular HF due to NICM.   Admitted 12/22/21 with A/C HFrEF. Diuresed with IV lasix. FWilder Gladestopped at patient's request. Echo showed EF down to 20-25%, severe AI . Had TEE 01/20/22 with severe central AI, EF 20%, and  severe RV dysfunction with plan for CPX and transplant evaluation.    Most recent admit 02/09/22 with A/C HFrEF-->low output. Diuresed with IV lasix and placed on milrinone. Unable to wean milrinone and discharged on milrinone 0.375 mcg. Discharged on 02/12/22.   Admitted 02/19/22 with recurrent cardiogenic shock. Milrinone increased to 0.5 mcg and given IV lasix. Poor response with minimal urine output and low mixed venous saturation. Low-dose norepi added without response. Given progressive acidosis taken to the cath lab for RHC/swan. Hemodynamics showed marked volume overload and low output. Decision was made to try IABP (despite severe AI). While in the cath lab developed respiratory distress. Bipap placed with improvement but later developed respiratory distress and required intubation Developed A fib RVR. Started on amio drip with chemical conversion. [facility] to [facility] for  high risk AVR.   [facility] consulted. S/P AVR. Hospital course complicated by A fib RVR. Had DCCV on 02/26/22 with restoration of SR.  Discharged on milrinone 0.5 mcg.  Creatinine at discharge 1.6. Discharged on 03/10/22.  Issue getting discharge medications. He was unable to get meds from pharmacy. Amio was picked up from the pharmacy on 03/12/22. He did not have eliquis.   [facility] and [facility] shortness of breath. On NRB but continued to decline and required intubation. EKG A fib RVR. Pertinent admission labs: BNP 2469, Hgb 10.5, WBC 9, mag 2.3, and creatinine 2.2. EKG A fib RVR. Started on amiodarone drip. Marked volume overload. Given 80 mg IV lasix. Minimal urine output.   [facility].  Arrived in intubated in A fib RVR 140s on amio 60 mg per hour + milrinone 0.5 mcg + fentanyl 100 mcg.   Review of Systems: [y] = yes, '[ ]'$  = no Patient is encephalopathic and or intubated. Therefore history has been obtained from chart review.    General: Weight gain '[ ]'$ ; Weight loss '[ ]'$ ; Anorexia '[ ]'$ ; Fatigue [ Y]; Fever '[ ]'$ ; Chills '[ ]'$ ; Weakness [ Y]  Cardiac: Chest pain/pressure '[ ]'$ ; Resting SOB [Y ]; Exertional SOB [ Y]; Orthopnea [Y ]; Pedal Edema '[ ]'$ ; Palpitations '[ ]'$ ; Syncope '[ ]'$ ; Presyncope '[ ]'$ ; Paroxysmal nocturnal dyspnea'[ ]'$   Pulmonary: Cough '[ ]'$ ; Wheezing'[ ]'$ ; Hemoptysis'[ ]'$ ; Sputum '[ ]'$ ; Snoring '[ ]'$   GI: Vomiting'[ ]'$ ; Dysphagia'[ ]'$ ; Melena'[ ]'$ ; Hematochezia '[ ]'$ ; Heartburn'[ ]'$ ; Abdominal pain '[ ]'$ ; Constipation '[ ]'$ ; Diarrhea '[ ]'$ ; BRBPR '[ ]'$   GU: Hematuria'[ ]'$ ; Dysuria '[ ]'$ ; Nocturia'[ ]'$   Vascular: Pain in legs with walking '[ ]'$ ; Pain in feet with lying flat '[ ]'$ ; Non-healing  sores '[ ]'$ ; Stroke '[ ]'$ ; TIA '[ ]'$ ; Slurred speech '[ ]'$ ;  Neuro: Headaches'[ ]'$ ; Vertigo'[ ]'$ ; Seizures'[ ]'$ ; Paresthesias'[ ]'$ ;Blurred vision '[ ]'$ ; Diplopia '[ ]'$ ; Vision changes '[ ]'$   Ortho/Skin: Arthritis '[ ]'$ ; Joint pain '[ ]'$ ; Muscle pain '[ ]'$ ; Joint swelling '[ ]'$ ; Back Pain '[ ]'$ ; Rash '[ ]'$   Psych: Depression'[ ]'$ ; Anxiety'[ ]'$   Heme: Bleeding problems '[ ]'$ ; Clotting disorders '[ ]'$ ; Anemia '[ ]'$   Endocrine: Diabetes '[ ]'$ ; Thyroid dysfunction'[ ]'$   Home Medications Prior to Admission medications   Medication Sig  Start Date End Date Taking? Authorizing Provider  allopurinol (ZYLOPRIM) 300 MG tablet Take 300 mg by mouth daily. 01/13/22  Yes [provider]  ALPRAZolam Duanne Moron) 0.5 MG tablet Take 0.5 mg by mouth at bedtime as needed for anxiety or sleep.   Yes [provider]  amiodarone (PACERONE) 200 MG tablet Take 200 mg by mouth daily. 03/10/22  Yes [provider]  aspirin EC 81 MG tablet Take 81 mg by mouth daily. Swallow whole.   Yes [provider]  milrinone (PRIMACOR) 20 MG/100 ML SOLN infusion Inject 0.0486 mg/min into the vein continuous. 02/20/22  Yes Clegg, Amy D, NP  polyethylene glycol (MIRALAX / GLYCOLAX) 17 g packet Take 17 g by mouth daily as needed for mild constipation. 03/10/22  Yes [provider]  potassium chloride (KLOR-CON M) 10 MEQ tablet Take 10 mEq by mouth daily. 03/10/22  Yes [provider]  torsemide (DEMADEX) 20 MG tablet Take 40 mg by mouth in the morning and at bedtime. 03/10/22 04/09/22 Yes [provider]  apixaban (ELIQUIS) 5 MG TABS tablet Take 5 mg by mouth 2 times daily at 12 noon and 4 pm. 03/10/22 06/08/22  [provider]  oxyCODONE (OXY IR/ROXICODONE) 5 MG immediate release tablet Take 5 mg by mouth every 6 (six) hours as needed for moderate pain. 03/10/22 03/17/22  [provider]  spironolactone (ALDACTONE) 25 MG tablet Take 0.5 tablets by mouth daily. 03/11/22 03/11/23  [provider]    Past Medical History: Past Medical History:  Diagnosis Date   Anxiety    CHF (congestive heart failure) ([place])    Chronic systolic heart failure (HCC)    CKD (chronic kidney disease) stage 2, GFR 60-89 ml/min    Dyspnea    with value issues- "if I dont take my medication"   Essential hypertension, benign    Headache    History of pneumonia    Mitral regurgitation    Moderate   Noncompliance    Nonischemic cardiomyopathy ([place])    Normal coronaries May 2012, LVEF < 20%   Pneumonia 2011   S/P  aortic valve repair 05/11/2017   Complex valvuloplasty including plication of left coronary leaflet and 16m Biostable HAART ring annuloplasty    Past Surgical History: Past Surgical History:  Procedure Laterality Date   AORTIC VALVE REPAIR N/A 05/11/2017   Procedure: AORTIC VALVE REPAIR;  Surgeon: ORexene Alberts MD;  [place]  Service: Open Heart Surgery;  Laterality: N/A;   CARDIAC SURGERY     IABP INSERTION N/A 02/19/2022   Procedure: IABP Insertion;  Surgeon: BJolaine Artist MD;  [place];  Service: Cardiovascular;  Laterality: N/A;   RIGHT HEART CATH N/A 02/19/2022   Procedure: RIGHT HEART CATH;  Surgeon: BJolaine Artist MD;  [place] [facility];  Service: Cardiovascular;  Laterality: N/A;   RIGHT/LEFT HEART CATH AND CORONARY ANGIOGRAPHY N/A 04/20/2017  Procedure: RIGHT/LEFT HEART CATH AND CORONARY ANGIOGRAPHY;  Surgeon: Jolaine Artist, MD;  Location: [place] [facility];  Service: Cardiovascular;  Laterality: N/A;   SURGERY SCROTAL / TESTICULAR     Testicular torsion   TEE WITHOUT CARDIOVERSION N/A 04/09/2017   Procedure: TRANSESOPHAGEAL ECHOCARDIOGRAM (TEE);  Surgeon: Jolaine Artist, MD;  Location: [facility] ENDOSCOPY;  Service: Cardiovascular;  Laterality: N/A;   TEE WITHOUT CARDIOVERSION N/A 05/11/2017   Procedure: TRANSESOPHAGEAL ECHOCARDIOGRAM (TEE);  Surgeon: Rexene Alberts, MD;  Location: [place];  Service: Open Heart Surgery;  Laterality: N/A;   TEE WITHOUT CARDIOVERSION N/A 01/20/2022   Procedure: TRANSESOPHAGEAL ECHOCARDIOGRAM (TEE);  Surgeon: Jolaine Artist, MD;  [facility]COPY;  Service: Cardiovascular;  Laterality: N/A;    Family History: Family History  Problem Relation Age of Onset   Breast cancer Mother    Stroke Father    Hypertension Father     Social History: Social History   Socioeconomic History   Marital status: Married    Spouse name: RaShannon   Number of children: 2   Years of  education: Not on file   Highest education level: Some college, no degree  Occupational History    Employer: [employer]  Tobacco Use   Smoking status: Never   Smokeless tobacco: Never  Vaping Use   Vaping Use: Never used  Substance and Sexual Activity   Alcohol use: Yes    Alcohol/week: 0.0 standard drinks of alcohol    Comment: occasional   Drug use: No   Sexual activity: Yes    Birth control/protection: Condom  Other Topics Concern   Not on file  Social History Narrative   Occupation: just started sanitation job cleaning      Patient is right-handed. He lives with his wife in a 1 story house. He drinks 1-2 sodas a day.    Social Determinants of Health   Financial Resource Strain: Not on file  Food Insecurity: Not on file  Transportation Needs: No Transportation Needs (09/28/2019)   PRAPARE - Hydrologist (Medical): No    Lack of Transportation (Non-Medical): No  Physical Activity: Insufficiently Active (09/28/2019)   Exercise Vital Sign    Days of Exercise per Week: 4 days    Minutes of Exercise per Session: 30 min  Stress: Not on file  Social Connections: Not on file    Allergies:  No Known Allergies  Objective:    Vital Signs:   Temp:  [97.8 F (36.6 C)] 97.8 F (36.6 C) (09/01 1043) Pulse Rate:  [45-117] 116 (09/01 1045) Resp:  [22-38] 25 (09/01 1045) BP: (116-145)/(94-118) 132/111 (09/01 1045) SpO2:  [94 %-100 %] 95 % (09/01 1045) FiO2 (%):  [100 %] 100 % (09/01 0952) Weight:  [95.3 kg] 95.3 kg (09/01 0928)    Weight change: Filed Weights   03/13/22 0928  Weight: 95.3 kg    Intake/Output:  No intake or output data in the 24 hours ending 03/13/22 1054    Physical Exam    General:  ETT HEENT: ETT Neck: supple. JVP  to jaw  Carotids 2+ bilat; no bruits. No lymphadenopathy or thyromegaly appreciated. Cor: PMI nondisplaced. Irregular rate & rhythm. No rubs, gallops or murmurs. Lungs: clear Abdomen: soft,  nontender, nondistended. No hepatosplenomegaly. No bruits or masses. Good bowel sounds. Extremities: cool, no cyanosis, clubbing, rash, R and LLE 3+ edema Neuro: intubated GU: Foley    Telemetry    A Fib RVR 130-140s on arrival.  EKG     Afib RVR  Labs   Basic Metabolic Panel: Recent Labs  Lab 03/13/22 1007  NA 133*  K 3.6  CL 97*  CO2 18*  GLUCOSE 225*  BUN 29*  CREATININE 2.21*  CALCIUM 8.6*  MG 2.3    Liver Function Tests: No results for input(s): "AST", "ALT", "ALKPHOS", "BILITOT", "PROT", "ALBUMIN" in the last 168 hours. No results for input(s): "LIPASE", "AMYLASE" in the last 168 hours. No results for input(s): "AMMONIA" in the last 168 hours.  CBC: Recent Labs  Lab 03/13/22 1007  WBC 9.2  HGB 10.5*  HCT 34.4*  MCV 90.3  PLT 303    Cardiac Enzymes: No results for input(s): "CKTOTAL", "CKMB", "CKMBINDEX", "TROPONINI" in the last 168 hours.  BNP: BNP (last 3 results) Recent Labs    02/08/22 0822 02/19/22 1002 03/13/22 1007  BNP 3,036.5* 3,100.6* 2,469.0*    ProBNP (last 3 results) No results for input(s): "PROBNP" in the last 8760 hours.   CBG: Recent Labs  Lab 03/13/22 0927  GLUCAP 231*    Coagulation Studies: No results for input(s): "LABPROT", "INR" in the last 72 hours.   Imaging   DG Chest Portable 1 View  Result Date: 03/13/2022 CLINICAL DATA:  Status post intubation. EXAM: PORTABLE CHEST 1 VIEW COMPARISON:  February 20, 2022. FINDINGS: Stable cardiomegaly. Large amount of overlying external objects is noted obscuring evaluation of the chest. Endotracheal tube appears to be in grossly good position. Increased bilateral lung opacities are noted concerning for edema or multifocal inflammation. Small pleural effusions may be present. Bony thorax is unremarkable. Status post aortic valve repair. IMPRESSION: Significantly limited exam due to overlying external objects, although the endotracheal tube appears to be in grossly good  position. Increased bilateral lung opacities as described above. Electronically Signed   By: Marijo Conception M.D.   On: 03/13/2022 10:08     Medications:     Current Medications:   Infusions:  amiodarone 60 mg/hr (03/13/22 1013)   Followed by   amiodarone     fentaNYL infusion INTRAVENOUS 25 mcg/hr (03/13/22 1026)   milrinone        Patient Profile   [name] [name] [name] is a 58 year with a history of HFrEF/ NICM dating back to 2012 which had improved in 2013. HF meds stopped and had recurrent HFrEF 2013 with EF down to 20%. Also has HTN, PVCs, [name], Aortic Insufficiency,  and previous aortic valve repair 2018 (with 47m annuloplasty ring),  biventricular HF due to NICM. [facility]   Admitted with shock/respiratory failure/a fib RVR[facility].     Assessment/Plan   1. A/C Biventricular HF-->Cardiogenic Shock On home milrinone 0.5 mcg. Continue milrinone 0.5 mcg. CO-OX 65%  -Marked volume overloaded. Given 80 mg IV lasix with poor response.  -Given additional 80 mg IV lasix with lasix drip started at 12 mg per hour.  -Check CO-OX/lactic acid now.  -Repeat ECHO - LVEF 5-10%  RV moderately reduced. IVC dilated.   2. Acute Hypoxic Respiratory Failure -Intubated in ED. CCM consulted   3. A fib RVR -Had DC-CV 02/26/22--> SR -He has missed doses of eliquis since d/c because he was unable to obtain elqiuis from pharmacy.   -On arrival in A fib RV, hemodynamically compromised.   -Continue amio drip. Given 150 mg bolus x2.  -Underwent urgent cardioversion.   4. CKD Stage IIIb -Holding spiro  -Creatinine on admit  2.2---> repeat BMET now.   5. S/P AVR  -H/O AV repair 2018 with complex valvuloplasty including plication of left coronary leaflet and aortic ring annuloplasty -AVR [facility] 02/2022  -Repeat Echo - valve ok.     Length of Stay: 0  Darrick Grinder, NP  03/13/2022, 10:54 AM  Advanced Heart Failure Team Pager  [phone] (M-F; 7a - 5p)  Please contact [facility] for night-coverage after hours (4p -7a ) and weekends on [email or web address]  Patient seen with NP, agree with the above note.   Full history as above.  Patient was received in transfer from [facility] today.  He arrived there with increased dyspnea and was intubated.  He was in atrial fibrillation with RVR.  He was discharged about 2 days ago from [facility] after high-risk surgical AVR.  He had trouble getting his meds after hospital discharge. His legs were still very swollen according to his wife at time of discharge.    When I saw him, patient was on milrinone 0.5 (had been sent home on this from hospitalization) with AF/RVR rate up to 140s.  BP was stable.  Patient had been started on amiodarone gtt.  Lactate was 4.4, creatinine 2.45. With uncontrolled HR and cardiogenic shock, patient underwent DCCV.  After DCCV, he was in NSR versus ectopic atrial rhythm rate 100s.  He was on heparin gtt and had been on Eliquis at home. CVP > 20.   Echo reviewed, severe biventricular failure with LV EF < 20%, severe RV dysfunction, normally functioning bioprosthetic aortic valve.   General: Intubated/sedated.  Neck: JVP 16+, no thyromegaly or thyroid nodule.  Lungs: Clear to auscultation bilaterally with normal respiratory effort. CV: Nondisplaced PMI.  Heart tachy, irregular S1/S2, no S3/S4, no murmur.  2+ edema to knees.  No carotid bruit.  Normal pedal pulses.  Abdomen: Soft, nontender, no hepatosplenomegaly, no distention.  Skin: Intact without lesions or rashes.  Neurologic: Sedated on vent.  Extremities: No clubbing or cyanosis.  HEENT: Normal.   Currently in NSR versus ectopic atrial rhythm post-DCCV, will continue amiodarone gtt and heparin gtt.    Cardiogenic shock post-bioprosthetic AVR with biventricular failure.  Initial lactate 4.4 with co-ox 59%, creatinine up to 2.45 (recent baseline does appear to be > 2). Patient had been on milrinone  0.5 at home prior to admission.  He is markedly volume overloaded with CVP > 20.  - Continue milrinone 0.5 - MAP stable, no NE yet.  - Repeat lactate now that back in NSR.   - lasix 80 mg IV x 1 then 15 mg/hr.   - Concerned about future course, he has decompensated inotrope-dependent BiV failure post-AVR for severe AI.  We will try to stabilize him over the next couple of days but fear that he may end up needing biventricular mechanical support as bridge to transplant.   Loralie Champagne. 03/13/2022 5:12 PM

## 2022-03-13 NOTE — Progress Notes (Signed)
ANTICOAGULATION CONSULT NOTE - Initial Consult  Pharmacy Consult for Heparin Indication: atrial fibrillation  No Known Allergies  Patient Measurements: Height: '5\' 10"'$  (177.8 cm) Weight: 95.3 kg (210 lb 1.6 oz) IBW/kg (Calculated) : 73 HEPARIN DW (KG): 92.5   Vital Signs: Temp: 97.8 F (36.6 C) (09/01 1043) Temp Source: Axillary (09/01 1043) BP: 133/105 (09/01 1115) Pulse Rate: 130 (09/01 1115)  Labs: Recent Labs    03/13/22 1007  HGB 10.5*  HCT 34.4*  PLT 303  CREATININE 2.21*    Estimated Creatinine Clearance: 51.5 mL/min (A) (by C-G formula based on SCr of 2.21 mg/dL (H)).   Medical History: Past Medical History:  Diagnosis Date   Anxiety    CHF (congestive heart failure) (HCC)    Chronic systolic heart failure (HCC)    CKD (chronic kidney disease) stage 2, GFR 60-89 ml/min    Dyspnea    with value issues- "if I dont take my medication"   Essential hypertension, benign    Headache    History of pneumonia    Mitral regurgitation    Moderate   Noncompliance    Nonischemic cardiomyopathy ([place])    Normal coronaries May 2012, LVEF < 20%   Pneumonia 2011   S/P aortic valve repair 05/11/2017   Complex valvuloplasty including plication of left coronary leaflet and 23m Biostable HAART ring annuloplasty    Medications:  See med rec  Assessment: 41[facility] and was initially placed on 15 L NRB but due to worsening respiratory distress, was intubated in the ED. The patient is on eliquis '5mg'$  po bid but his wife did report he had missed Eliquis since the time of hospital discharge as they were unable to get his medications filled . Pharmacy asked to start heparin. Will do baseline labs APTT and HL, no bolus for heparin.  Goal of Therapy:  Heparin level 0.3-0.7 units/ml aPTT 66-102 seconds Monitor platelets by anticoagulation protocol: Yes   Plan:  Start heparin infusion at 1300  units/hr Check APTT and anti-Xa level in ~6 hours and daily while on heparin Continue to monitor H&H and platelets  LIsac Sarna BS Pharm D, BCPS Clinical Pharmacist 03/13/2022,11:23 AM

## 2022-03-13 NOTE — ED Notes (Signed)
ED Provider at bedside. 

## 2022-03-13 NOTE — Progress Notes (Signed)
Orthopedic Tech Progress Note Patient Details:  [name] [name] 1980/11/13 [id number]  Ortho Devices Type of Ortho Device: Haematologist Ortho Device/Splint Location: BLE Ortho Device/Splint Interventions: Application, Ordered   Post Interventions Patient Tolerated: Well  [name] 03/13/2022, 4:11 PM

## 2022-03-13 NOTE — ED Notes (Signed)
Family and Chaplain at bedside

## 2022-03-13 NOTE — TOC CM/SW Note (Signed)
.    Transition of Care [facility]) Screening Note   Patient Details  Name: [name] [name] Date of Birth: [birth year]   Transition of Care [facility]) CM/SW Contact:    Erenest Rasher, RN Phone Number: [phone] 03/13/2022, 5:56 PM    Transition of Care Department [facility]) has reviewed patient. We will continue to monitor patient advancement through interdisciplinary progression rounds. If new patient transition needs arise, please place a TOC consult. Will continue to follow for dc needs.  [place], Heart Failure TOC CM [phone]

## 2022-03-13 NOTE — ED Provider Notes (Signed)
[facility] EMERGENCY DEPARTMENT Provider Note   CSN: [id number] Arrival date & time: 03/13/22  0920     History  Chief Complaint  Patient presents with   Chest Pain    [name] [name] is a 41 y.o. male with a complicated cardiac history as noted below presenting to ED with shortness of breath and palpitations.  The patient is in respiratory distress on arrival and cannot provide much history.  His wife tells me he was discharged from [facility] about 2 days ago after admission and transfer there for aortic valve replacement.  He had been discharged on multiple medications including Eliquis but these meds were not sent to the pharmacy, his wife says he has not been taking his medicines for 2 days.  He was discharged with a milrinone infusion through a left arm PICC line which has been running.  She reports that he woke up complaining of shortness of breath this morning and is rapidly decompensated since then.  Medical record shows the patient was admitted to the hospital earlier this month and subsequently transferred to [facility] for AVR.  While in the hospital the partition went into cardiogenic shock and had an intra-aortic balloon pump.  Intubated for acute hypoxic respiratory failure.  Per cardiology evaluation in hospital, patient has hx of HFrEF/ NICM dating back to 2012 which had improved in 2013. HF meds stopped and had recurrent HFrEF 2013 with EF down to 20%. Also has HTN, PVCs, MR, Aortic Insufficiency,  and previous aortic valve repair 2018 (with 15m annuloplasty ring) and now with severe severe biventricular HF due to NICM.  He was noted to have A-fib with RVR while in the hospital and was started on amiodarone with spontaneous conversion.  HPI     Home Medications Prior to Admission medications   Medication Sig Start Date End Date Taking? Authorizing Provider  allopurinol (ZYLOPRIM) 300 MG tablet Take 300 mg by mouth daily. 01/13/22  Yes  [provider]  ALPRAZolam (Duanne Moron 0.5 MG tablet Take 0.5 mg by mouth at bedtime as needed for anxiety or sleep.   Yes [provider]  amiodarone (PACERONE) 200 MG tablet Take 200 mg by mouth daily. 03/10/22  Yes [provider]  aspirin EC 81 MG tablet Take 81 mg by mouth daily. Swallow whole.   Yes [provider]  milrinone (PRIMACOR) 20 MG/100 ML SOLN infusion Inject 0.0486 mg/min into the vein continuous. 02/20/22  Yes Clegg, Amy D, NP  polyethylene glycol (MIRALAX / GLYCOLAX) 17 g packet Take 17 g by mouth daily as needed for mild constipation. 03/10/22  Yes [provider]  potassium chloride (KLOR-CON M) 10 MEQ tablet Take 10 mEq by mouth daily. 03/10/22  Yes [provider]  torsemide (DEMADEX) 20 MG tablet Take 40 mg by mouth in the morning and at bedtime. 03/10/22 04/09/22 Yes [provider]  apixaban (ELIQUIS) 5 MG TABS tablet Take 5 mg by mouth 2 times daily at 12 noon and 4 pm. 03/10/22 06/08/22  [provider]  oxyCODONE (OXY IR/ROXICODONE) 5 MG immediate release tablet Take 5 mg by mouth every 6 (six) hours as needed for moderate pain. 03/10/22 03/17/22  [provider]  spironolactone (ALDACTONE) 25 MG tablet Take 0.5 tablets by mouth daily. 03/11/22 03/11/23  [provider]      Allergies    Patient has no known allergies.    Review of Systems   Review of Systems  Physical Exam Updated  Vital Signs BP (!) 133/105   Pulse (!) 130   Temp 97.8 F (36.6 C) (Axillary)   Resp (!) 28   Ht '5\' 10"'$  (1.778 m)   Wt 95.3 kg   SpO2 95%   BMI 30.15 kg/m  Physical Exam Constitutional:      Appearance: He is ill-appearing and diaphoretic.  HENT:     Head: Normocephalic and atraumatic.  Eyes:     Conjunctiva/sclera: Conjunctivae normal.     Pupils: Pupils are equal, round, and reactive to light.  Cardiovascular:     Rate and Rhythm: Tachycardia present. Rhythm irregular.  Pulmonary:     Effort:  Pulmonary effort is normal.     Comments: Labored breathing on 15 L nonrebreather, speaking in very short sentences, Abdominal:     General: There is no distension.     Tenderness: There is no abdominal tenderness.  Skin:    General: Skin is warm.  Neurological:     General: No focal deficit present.     Mental Status: He is alert. Mental status is at baseline.  Psychiatric:        Mood and Affect: Mood normal.        Behavior: Behavior normal.     ED Results / Procedures / Treatments   Labs (all labs ordered are listed, but only abnormal results are displayed) Labs Reviewed  BASIC METABOLIC PANEL - Abnormal; Notable for the following components:      Result Value   Sodium 133 (*)    Chloride 97 (*)    CO2 18 (*)    Glucose, Bld 225 (*)    BUN 29 (*)    Creatinine, Ser 2.21 (*)    Calcium 8.6 (*)    GFR, Estimated 38 (*)    Anion gap 18 (*)    All other components within normal limits  CBC - Abnormal; Notable for the following components:   RBC 3.81 (*)    Hemoglobin 10.5 (*)    HCT 34.4 (*)    RDW 22.1 (*)    nRBC 0.5 (*)    All other components within normal limits  BRAIN NATRIURETIC PEPTIDE - Abnormal; Notable for the following components:   B Natriuretic Peptide 2,469.0 (*)    All other components within normal limits  HEPATIC FUNCTION PANEL - Abnormal; Notable for the following components:   Albumin 3.1 (*)    Total Bilirubin 1.4 (*)    Bilirubin, Direct 0.4 (*)    Indirect Bilirubin 1.0 (*)    All other components within normal limits  CBG MONITORING, ED - Abnormal; Notable for the following components:   Glucose-Capillary 231 (*)    All other components within normal limits  SARS CORONAVIRUS 2 BY RT PCR  MAGNESIUM  BLOOD GAS, ARTERIAL  COOXEMETRY PANEL    EKG None  Radiology DG Chest Portable 1 View  Result Date: 03/13/2022 CLINICAL DATA:  Status post intubation. EXAM: PORTABLE CHEST 1 VIEW COMPARISON:  February 20, 2022. FINDINGS: Stable  cardiomegaly. Large amount of overlying external objects is noted obscuring evaluation of the chest. Endotracheal tube appears to be in grossly good position. Increased bilateral lung opacities are noted concerning for edema or multifocal inflammation. Small pleural effusions may be present. Bony thorax is unremarkable. Status post aortic valve repair. IMPRESSION: Significantly limited exam due to overlying external objects, although the endotracheal tube appears to be in grossly good position. Increased bilateral lung opacities as described above. Electronically Signed   By: Jeneen Rinks  Murlean Caller M.D.   On: 03/13/2022 10:08    Procedures .Critical Care  Performed by: Wyvonnia Dusky, MD Authorized by: Wyvonnia Dusky, MD   Critical care provider statement:    Critical care time (minutes):  45   Critical care time was exclusive of:  Separately billable procedures and treating other patients   Critical care was necessary to treat or prevent imminent or life-threatening deterioration of the following conditions:  Respiratory failure and circulatory failure   Critical care was time spent personally by me on the following activities:  Ordering and performing treatments and interventions, ordering and review of laboratory studies, ordering and review of radiographic studies, pulse oximetry, review of old charts, examination of patient and evaluation of patient's response to treatment   Care discussed with: admitting provider   Comments:     Cardiogenic shock management; intubation for respiratory failure; vent management; cardiology consultation; amiodarone for A Fib Procedure Name: Intubation Date/Time: 03/13/2022 11:28 AM  Performed by: Wyvonnia Dusky, MDPre-anesthesia Checklist: Patient identified, Patient being monitored, Emergency Drugs available, Timeout performed and Suction available Oxygen Delivery Method: Non-rebreather mask Preoxygenation: Pre-oxygenation with 100% oxygen Induction Type:  Rapid sequence Ventilation: Mask ventilation without difficulty Laryngoscope Size: Glidescope and 3 Tube size: 7.5 mm Number of attempts: 1 Airway Equipment and Method: Video-laryngoscopy Placement Confirmation: ETT inserted through vocal cords under direct vision, CO2 detector and Breath sounds checked- equal and bilateral Secured at: 25 cm Tube secured with: ETT holder        Medications Ordered in ED Medications  fentaNYL 2541mg in NS 2543m(1034mml) infusion-PREMIX (25 mcg/hr Intravenous New Bag/Given 03/13/22 1026)  amiodarone (NEXTERONE) 1.8 mg/mL load via infusion 150 mg (150 mg Intravenous Bolus from Bag 03/13/22 1013)    Followed by  amiodarone (NEXTERONE PREMIX) 360-4.14 MG/200ML-% (1.8 mg/mL) IV infusion (60 mg/hr Intravenous New Bag/Given 03/13/22 1013)    Followed by  amiodarone (NEXTERONE PREMIX) 360-4.14 MG/200ML-% (1.8 mg/mL) IV infusion (has no administration in time range)  milrinone (PRIMACOR) 20 MG/100 ML (0.2 mg/mL) infusion (has no administration in time range)  rocuronium (ZEMURON) injection 70 mg (70 mg Intravenous Given 03/13/22 0943)  etomidate (AMIDATE) injection 30 mg (30 mg Intravenous Given 03/13/22 0942)  furosemide (LASIX) injection 80 mg (80 mg Intravenous Given 03/13/22 0959)  heparin lock flush 100 unit/mL (500 Units Intracatheter Given 03/13/22 1014)  fentaNYL (SUBLIMAZE) injection 100 mcg (100 mcg Intravenous Given 03/13/22 1028)    ED Course/ Medical Decision Making/ A&P Clinical Course as of 03/13/22 1132  Fri Mar 13, 2022  0950 Intubated on first attempt - BP stable, HR remains in A Fib with RVR, amiodarone ordered, IV lasix for diuresis, fentanyl for sedation.  Wife updated prior to intubation and in agreement with plan.  Will page cardiology  [MT]  1009 Spoke to oncall cardiologist who agrees for CVICU admission - will discuss with his colleagues at [facility] and we will attempt to arrange transfer [MT]  104Salix HilDebara Pickett bedside agrees with plan for  admission.  BP stable, HR improving.  Fentanyl and amiodarone infusing.  Heparin ordered for A Fib (pt has missed 2 days of eliquis) [MT]    Clinical Course User Index [MT] Wadell Craddock, MatCarola RhineD                           Medical Decision Making Amount and/or Complexity of Data Reviewed Labs: ordered. Radiology: ordered.  Risk Prescription drug management. Decision regarding  hospitalization.   This patient presents to the ED with concern for respiratory distress. This involves an extensive number of treatment options, and is a complaint that carries with it a high risk of complications and morbidity.  The differential diagnosis includes CHF exacerbation with cardiogenic shock versus pleural effusion versus anemia versus symptomatic arrhythmia versus other  Co-morbidities that complicate the patient evaluation: History of atrial fibrillation, end-stage cardiogenic heart disease, at high risk for cardiac and pulmonary complications  Additional history obtained from EMS at bedside and patient's wife at bedside  External records from outside source obtained and reviewed including hospital course and discharge summary from August from the cardiac ICU, [facility] transfer notes.  I ordered and personally interpreted labs.  The pertinent results include: COVID-negative, BNP elevated, hemoglobin near baseline at 10.5.  Creatinine also near baseline at 2.2.  I ordered imaging studies including postintubation x-ray of the chest I independently visualized and interpreted imaging which showed ET tube in appropriate place, significant pulmonary edema bilaterally. I agree with the radiologist interpretation  The patient was maintained on a cardiac monitor.  I personally viewed and interpreted the cardiac monitored which showed an underlying rhythm of: A-fib with RVR  Per my interpretation the patient's ECG shows A-fib with RVR  I ordered medication including heparin for A-fib, amiodarone for A-fib, RSI  medications for intubation.  Milrinone ordered per cardiology.  Fentanyl ordered for sedation.  I have reviewed the patients home medicines and have made adjustments as needed  Test Considered: Overall of a lower clinical suspicion for acute pulmonary embolism in the setting, though he is only missed 1 day of Eliquis per their report; his shortness of breath is very likely related to the significant pulmonary edema from his congestive heart failure.  I likewise have a lower suspicion for sepsis.  He is afebrile, has no leukocytosis.  I suspect the pulmonary "infiltrates" noted on x-rays is likely fluid.  His COVID test is negative.  I requested consultation with the cardiology,  Dr Debara Pickett, and discussed lab and imaging findings as well as pertinent plan - they recommend: Transfer cardiac ICU admission   After the interventions noted above, I reevaluated the patient and found that they have: stayed the same  Dispostion:  After consideration of the diagnostic results and the patients response to treatment, I feel that the patent would benefit from medical admission and transfer to [facility] for higher level of care.  The patient's wife and mother were updated at the bedside in agreement with plan         Final Clinical Impression(s) / ED Diagnoses Final diagnoses:  Atrial fibrillation with RVR ([place])  Acute on chronic congestive heart failure, unspecified heart failure type ([place])  Acute respiratory failure with hypoxia ([place])  Acute pulmonary edema ([facility])  Acute cor pulmonale ([place])    Rx / DC Orders ED Discharge Orders     None         Wyvonnia Dusky, MD 03/13/22 1132

## 2022-03-13 NOTE — Procedures (Signed)
Arterial Catheter Insertion Procedure Note  [name] [name]  [id number]  1980-10-23  Date:03/13/22  Time:3:09 PM    Provider Performing: Myrtie Neither    Procedure: Insertion of Arterial Line [phone]) without US guidance  Indication(s) Blood pressure monitoring and/or need for frequent ABGs  Consent Unable to obtain consent due to emergent nature of procedure.  Anesthesia None   Time Out Verified patient identification, verified procedure, site/side was marked, verified correct patient position, special equipment/implants available, medications/allergies/relevant history reviewed, required imaging and test results available.   Sterile Technique Maximal sterile technique including full sterile barrier drape, hand hygiene, sterile gown, sterile gloves, mask, hair covering, sterile ultrasound probe cover (if used).   Procedure Description Area of catheter insertion was cleaned with chlorhexidine and draped in sterile fashion. Without real-time ultrasound guidance an arterial catheter was placed into the right radial artery.  Appropriate arterial tracings confirmed on monitor.     Complications/Tolerance None; patient tolerated the procedure well.   EBL Minimal   Specimen(s) None

## 2022-03-13 NOTE — Procedures (Signed)
Electrical Cardioversion Procedure Note DIAMOND MARTUCCI [id number] [birth year]  Procedure: Electrical Cardioversion Indications:  Atrial Fibrillation  Procedure Details Consent: Unable to obtain consent because of altered level of consciousness.  Emergent consent given afib/RVR and cardiogenic shock. Time Out: Verified patient identification, verified procedure, site/side was marked, verified correct patient position, special equipment/implants available, medications/allergies/relevent history reviewed, required imaging and test results available.  Performed  Patient placed on cardiac monitor, pulse oximetry, supplemental oxygen as necessary.  Sedation given:  Etomidate per CCM.  Pacer pads placed anterior and posterior chest.  Cardioverted 1 time(s).  Cardioverted at [place].  Evaluation Findings: Post procedure EKG shows: NSR vs ectopic atrial rhythm rate around 333 bpm.  Complications: None Patient did tolerate procedure well.   Loralie Champagne 03/13/2022, 4:43 PM

## 2022-03-14 ENCOUNTER — Inpatient Hospital Stay (HOSPITAL_COMMUNITY): Payer: BC Managed Care – PPO

## 2022-03-14 DIAGNOSIS — R570 Cardiogenic shock: Secondary | ICD-10-CM | POA: Diagnosis not present

## 2022-03-14 DIAGNOSIS — I5023 Acute on chronic systolic (congestive) heart failure: Secondary | ICD-10-CM | POA: Diagnosis not present

## 2022-03-14 LAB — CBC
HCT: 28.8 % — ABNORMAL LOW (ref 39.0–52.0)
Hemoglobin: 9.7 g/dL — ABNORMAL LOW (ref 13.0–17.0)
MCH: 28.1 pg (ref 26.0–34.0)
MCHC: 33.7 g/dL (ref 30.0–36.0)
MCV: 83.5 fL (ref 80.0–100.0)
Platelets: 288 10*3/uL (ref 150–400)
RBC: 3.45 MIL/uL — ABNORMAL LOW (ref 4.22–5.81)
RDW: 20.9 % — ABNORMAL HIGH (ref 11.5–15.5)
WBC: 9.1 10*3/uL (ref 4.0–10.5)
nRBC: 0.2 % (ref 0.0–0.2)

## 2022-03-14 LAB — BASIC METABOLIC PANEL
Anion gap: 13 (ref 5–15)
Anion gap: 14 (ref 5–15)
BUN: 29 mg/dL — ABNORMAL HIGH (ref 6–20)
BUN: 38 mg/dL — ABNORMAL HIGH (ref 6–20)
CO2: 22 mmol/L (ref 22–32)
CO2: 20 mmol/L — ABNORMAL LOW (ref 22–32)
Calcium: 8.5 mg/dL — ABNORMAL LOW (ref 8.9–10.3)
Calcium: 9 mg/dL (ref 8.9–10.3)
Chloride: 99 mmol/L (ref 98–111)
Chloride: 101 mmol/L (ref 98–111)
Creatinine, Ser: 2.07 mg/dL — ABNORMAL HIGH (ref 0.61–1.24)
Creatinine, Ser: 2.66 mg/dL — ABNORMAL HIGH (ref 0.61–1.24)
GFR, Estimated: 41 mL/min — ABNORMAL LOW (ref >60)
GFR, Estimated: 30 mL/min — ABNORMAL LOW (ref >60)
Glucose, Bld: 126 mg/dL — ABNORMAL HIGH (ref 70–99)
Glucose, Bld: 120 mg/dL — ABNORMAL HIGH (ref 70–99)
Potassium: 3.6 mmol/L (ref 3.5–5.1)
Potassium: 4.9 mmol/L (ref 3.5–5.1)
Sodium: 134 mmol/L — ABNORMAL LOW (ref 135–145)
Sodium: 135 mmol/L (ref 135–145)

## 2022-03-14 LAB — GLUCOSE, CAPILLARY
Glucose-Capillary: 117 mg/dL — ABNORMAL HIGH (ref 70–99)
Glucose-Capillary: 138 mg/dL — ABNORMAL HIGH (ref 70–99)
Glucose-Capillary: 139 mg/dL — ABNORMAL HIGH (ref 70–99)
Glucose-Capillary: 156 mg/dL — ABNORMAL HIGH (ref 70–99)
Glucose-Capillary: 121 mg/dL — ABNORMAL HIGH (ref 70–99)
Glucose-Capillary: 129 mg/dL — ABNORMAL HIGH (ref 70–99)

## 2022-03-14 LAB — COOXEMETRY PANEL
Carboxyhemoglobin: 2.1 % — ABNORMAL HIGH (ref 0.5–1.5)
Carboxyhemoglobin: 2 % — ABNORMAL HIGH (ref 0.5–1.5)
Methemoglobin: <0.7 % (ref 0.0–1.5)
Methemoglobin: 1.2 % (ref 0.0–1.5)
O2 Saturation: 80.8 %
O2 Saturation: 59.6 %
Total hemoglobin: 9.4 g/dL — ABNORMAL LOW (ref 12.0–16.0)
Total hemoglobin: 11 g/dL — ABNORMAL LOW (ref 12.0–16.0)

## 2022-03-14 LAB — PHOSPHORUS
Phosphorus: 3.8 mg/dL (ref 2.5–4.6)
Phosphorus: 5.2 mg/dL — ABNORMAL HIGH (ref 2.5–4.6)

## 2022-03-14 LAB — MAGNESIUM
Magnesium: 1.9 mg/dL (ref 1.7–2.4)
Magnesium: 2.1 mg/dL (ref 1.7–2.4)

## 2022-03-14 LAB — APTT
aPTT: 53 seconds — ABNORMAL HIGH (ref 24–36)
aPTT: 52 seconds — ABNORMAL HIGH (ref 24–36)
aPTT: 56 seconds — ABNORMAL HIGH (ref 24–36)

## 2022-03-14 LAB — HEPARIN LEVEL (UNFRACTIONATED): Heparin Unfractionated: 0.68 IU/mL (ref 0.30–0.70)

## 2022-03-14 MED ORDER — VITAL HIGH PROTEIN PO LIQD
1000.0000 mL | ORAL | Status: DC
Start: 2022-03-14 — End: 2022-03-14
  Administered 2022-03-14: 1000 mL

## 2022-03-14 MED ORDER — AMIODARONE LOAD VIA INFUSION
150.0000 mg | Freq: Once | INTRAVENOUS | Status: AC
  Administered 2022-03-14: 150 mg via INTRAVENOUS

## 2022-03-14 MED ORDER — POTASSIUM CHLORIDE 20 MEQ PO PACK
40.0000 meq | PACK | Freq: Two times a day (BID) | ORAL | Status: DC
  Administered 2022-03-14: 40 meq
  Filled 2022-03-14 (×2): qty 2

## 2022-03-14 MED ORDER — ASPIRIN 81 MG PO CHEW
81.0000 mg | CHEWABLE_TABLET | Freq: Every day | ORAL | Status: DC
  Administered 2022-03-14 – 2022-03-15 (×2): 81 mg via ORAL
  Filled 2022-03-14 (×2): qty 1

## 2022-03-14 MED ORDER — POTASSIUM CHLORIDE CRYS ER 20 MEQ PO TBCR: 40.0000 meq | EXTENDED_RELEASE_TABLET | Freq: Two times a day (BID) | ORAL | Status: DC

## 2022-03-14 MED ORDER — METOLAZONE 5 MG PO TABS
5.0000 mg | ORAL_TABLET | Freq: Once | ORAL | Status: AC
  Administered 2022-03-14: 5 mg via ORAL
  Filled 2022-03-14: qty 1

## 2022-03-14 MED ORDER — PROSOURCE TF20 ENFIT COMPATIBL EN LIQD
60.0000 mL | Freq: Every day | ENTERAL | Status: DC
  Administered 2022-03-14 – 2022-03-15 (×2): 60 mL
  Filled 2022-03-14 (×2): qty 60

## 2022-03-14 MED ORDER — POTASSIUM CHLORIDE 20 MEQ PO PACK
40.0000 meq | PACK | Freq: Once | ORAL | Status: AC
Start: 2022-03-14 — End: 2022-03-14
  Administered 2022-03-14: 40 meq
  Filled 2022-03-14: qty 2

## 2022-03-14 MED ORDER — DEXTROSE 5 % IV SOLN
6.0000 mg/h | INTRAVENOUS | Status: DC
  Administered 2022-03-14 – 2022-03-15 (×5): 20 mg/h via INTRAVENOUS
  Administered 2022-03-16: 15 mg/h via INTRAVENOUS
  Administered 2022-03-16: 20 mg/h via INTRAVENOUS
  Administered 2022-03-17: 15 mg/h via INTRAVENOUS
  Administered 2022-03-18: 10 mg/h via INTRAVENOUS
  Filled 2022-03-14 (×10): qty 20

## 2022-03-14 MED ORDER — VITAL 1.5 CAL PO LIQD
1000.0000 mL | ORAL | Status: DC
  Administered 2022-03-14: 1000 mL

## 2022-03-14 MED ORDER — AMIODARONE LOAD VIA INFUSION
150.0000 mg | Freq: Once | INTRAVENOUS | Status: AC
  Administered 2022-03-14: 150 mg via INTRAVENOUS
  Filled 2022-03-14: qty 83.34

## 2022-03-14 MED ORDER — CHLORHEXIDINE GLUCONATE CLOTH 2 % EX PADS
6.0000 | MEDICATED_PAD | Freq: Every day | CUTANEOUS | Status: DC
  Administered 2022-03-14 – 2022-03-23 (×10): 6 via TOPICAL

## 2022-03-14 NOTE — Progress Notes (Signed)
   NAME:  [name] [name], MRN:  [id number], DOB:  [birth year], LOS: 1 ADMISSION DATE:  03/13/2022, CONSULTATION DATE:  9/1 REFERRING MD:  Marigene Ehlers, CHIEF COMPLAINT:  dyspnea   History of Present Illness:  41 y/o male with severe NICM who just underwent high risk AVR with bioprosthetic valve at [facility] on 9/13 d/c home on 8/29 admitted on 9/1 from [facility] with respiratory failure in setting of volume overload.   Required intubation, mechanical ventilation.   Pertinent  Medical History  Systolic congestive heart failure, CKD stage II, hypertension, hyperlipidemia, status post aortic valve repair, mitral regurgitation, and anxiety  Significant Hospital Events: Including procedures, antibiotic start and stop dates in addition to other pertinent events   9/1 for shortness of breath and palpitations found in decompensated heart failure requiring intubation greater than protection, cardioversion attempt by heart failure  Interim History / Subjective:  Cardioversion overnight Tachycardia and hypertension with SBT this morning  Objective   Blood pressure (!) 125/102, pulse (!) 128, temperature 98.3 F (36.8 C), temperature source Oral, resp. rate 12, height '5\' 10"'$  (1.778 m), weight 95.3 kg, SpO2 94 %. CVP:  [13 mmHg-25 mmHg] 23 mmHg  Vent Mode: PRVC FiO2 (%):  [40 %-100 %] 40 % Set Rate:  [20 bmp-28 bmp] 20 bmp Vt Set:  [550 mL-580 mL] 580 mL PEEP:  [5 cmH20] 5 cmH20 Plateau Pressure:  [17 cmH20-24 cmH20] 24 cmH20   Intake/Output Summary (Last 24 hours) at 03/14/2022 0932 Last data filed at 03/14/2022 0800 Gross per 24 hour  Intake 1952.68 ml  Output 3625 ml  Net -1672.32 ml   Filed Weights   03/13/22 0928  Weight: 95.3 kg    Examination:  General:  In bed on vent HENT: NCAT ETT in place PULM: Crackles bases B, vent supported breathing CV: RRR, no mgr GI: BS+, soft, nontender MSK: normal bulk and tone Neuro: sedated on vent    Resolved Hospital Problem list     Assessment & Plan:   Cardiogenic shock in setting of biventricular heart failure S/p bioprosthetic aortic valve replacement on 8/13 Mitral regurgitation Atrial fibrillation with RVR Amiodarone and heparin  Lasix drip for diuresis Milrinone, norepinephrine per heart failure service  Acute hypoxemic respiratory failure due to acute pulmonary edema > improved Full mechanical vent support VAP prevention Daily WUA/SBT Hopeful for extubation in next 24 hours  AKI > improved Monitor BMET and UOP Replace electrolytes as needed  Hyperglycemia SSI  Add tube feeding  Best Practice (right click and "Reselect all SmartList Selections" daily)   Diet/type: tubefeeds DVT prophylaxis: systemic heparin GI prophylaxis: PPI Lines: Central line and yes and it is still needed Foley:  Yes, and it is still needed Code Status:  full code Last date of multidisciplinary goals of care discussion [per primary]    Critical care time: 35 minutes    Roselie Awkward, MD [place] [facility] Pager: [phone] Cell: [phone] After 7:00 pm call Elink  [phone]

## 2022-03-14 NOTE — Progress Notes (Signed)
ANTICOAGULATION CONSULT NOTE - Follow Up Consult  Pharmacy Consult for heparin Indication: atrial fibrillation  No Known Allergies  Patient Measurements: Height: '5\' 10"'$  (177.8 cm) Weight: 95.3 kg (210 lb 1.6 oz) IBW/kg (Calculated) : 73 Heparin Dosing Weight: 92.5kg  Vital Signs: Temp: 98.6 F (37 C) (09/02 1204) Temp Source: Oral (09/02 1204) BP: 130/85 (09/02 0735) Pulse Rate: 112 (09/02 1300)  Labs: Recent Labs    03/13/22 1007 03/13/22 1151 03/13/22 1152 03/13/22 1410 03/13/22 1452 03/13/22 1806 03/13/22 1825 03/14/22 0251 03/14/22 1007  HGB 10.5*  --   --   --  10.5* 11.2*  --  9.7*  --   HCT 34.4*  --   --   --  31.0* 33.0*  --  28.8*  --   PLT 303  --   --   --   --   --   --  288  --   APTT  --    < >  --  42*  --   --  37* 53* 52*  LABPROT  --   --   --  18.9*  --   --   --   --   --   INR  --   --   --  1.6*  --   --   --   --   --   HEPARINUNFRC  --   --  0.54  --   --   --   --  0.68  --   CREATININE 2.21*  --   --  2.45*  --   --   --  2.07*  --    < > = values in this interval not displayed.     Estimated Creatinine Clearance: 55 mL/min (A) (by C-G formula based on SCr of 2.07 mg/dL (H)).   Assessment: 41 yo M w/ PMH afib who was prescribed apixaban at time of last discharge (but received therapy during his inpatient stay). Patient was unable to pick up outpatient Rx and therefore last dose was during his admission 8/29 (>48h).   aPTT 52 (subtherapeutic)  Goal of Therapy:  Heparin level 0.3-0.7 units/ml aPTT 66-102s Monitor platelets by anticoagulation protocol: Yes   Plan:  Increase heparin infusion rate to 1900 units/hr 6hr HL at 2000 Continue to monitor CBC, daily HL, and s/sx bleeding   Thank you for allowing pharmacy to participate in this patient's care.  Reatha Harps, PharmD PGY2 Pharmacy Resident 03/14/2022 2:01 PM Check [email or web address] for unit specific pharmacy number

## 2022-03-14 NOTE — Progress Notes (Signed)
Initial Nutrition Assessment  DOCUMENTATION CODES:   Not applicable  INTERVENTION:   Trickle Tube Feeds via OG:  Start Vital 1.5 at 20 mL/hr advance by 10 mL q6h to goal rate of 60 mL/hr (1440 mL/day) 60 mL ProSource TF20 - daily Provides 2240 kcal, 117 gm protein, and 1100 mL free water daily.  NUTRITION DIAGNOSIS:   Inadequate oral intake related to inability to eat as evidenced by NPO status.  GOAL:   Patient will meet greater than or equal to 90% of their needs  MONITOR:   Vent status, TF tolerance, I & O's, Labs  REASON FOR ASSESSMENT:   Consult Enteral/tube feeding initiation and management  ASSESSMENT:   41 y.o male presented to [facility] ED with dyspnea and required intubation in the ED. Pt recently admitted in July and August. PMH includes CHF, CKD III, DM, and HTN. Pt admitted with cardiogenic shock in the setting of biventricular heart failure, AKI on CKD, and acute respiratory failure due to pulmonary edema.   RD received a consult to initiate trickle tube feeds. MD ok with advancing to fill tube feed rate.   Patient is currently intubated on ventilator support. MV: 13.3 L/min MAP (a-line): 93 Temp (24hrs), Avg:98 F (36.7 C), Min:97.6 F (36.4 C), Max:98.3 F (36.8 C) Continuous Medications: Precedex Fentanyl Lasix Heparin Milrinone Levophed  Medications reviewed and include: Colace, Fentanyl. NovoLog,Protonix, Miralax, Potassium Chloride Labs reviewed: Sodium 134, BUN 29, Creatinine 2.07, 24 hr CBG 117-231  UOP: 3265 mL x 24 hours  NUTRITION - FOCUSED PHYSICAL EXAM:  Deferred to follow-up.   Diet Order:   Diet Order             Diet NPO time specified  Diet effective midnight           Diet NPO time specified  Diet effective now                   EDUCATION NEEDS:   Not appropriate for education at this time  Skin:  Skin Assessment: Reviewed RN Assessment  Last BM:  Unknown  Height:   Ht Readings from Last 1 Encounters:   03/13/22 '5\' 10"'$  (1.778 m)    Weight:   Wt Readings from Last 1 Encounters:  03/13/22 95.3 kg    Ideal Body Weight:  75.5 kg  BMI:  Body mass index is 30.15 kg/m.  Estimated Nutritional Needs:   Kcal:  2100-2300  Protein:  105-120 grams  Fluid:  >/= 2 L    Hermina Barters RD, LDN Clinical Dietitian See [facility] for contact information.

## 2022-03-14 NOTE — Progress Notes (Signed)
ANTICOAGULATION CONSULT NOTE - Follow Up Consult  Pharmacy Consult for heparin Indication: atrial fibrillation  No Known Allergies  Patient Measurements: Height: '5\' 10"'$  (177.8 cm) Weight: 95.3 kg (210 lb 1.6 oz) IBW/kg (Calculated) : 73 Heparin Dosing Weight: 92.5kg  Vital Signs: Temp: 99.1 F (37.3 C) (09/02 1949) Temp Source: Oral (09/02 1949) BP: 112/103 (09/02 1520) Pulse Rate: 121 (09/02 2000)  Labs: Recent Labs    03/13/22 1007 03/13/22 1151 03/13/22 1152 03/13/22 1410 03/13/22 1452 03/13/22 1806 03/13/22 1825 03/14/22 0251 03/14/22 1007 03/14/22 1956  HGB 10.5*  --   --   --  10.5* 11.2*  --  9.7*  --   --   HCT 34.4*  --   --   --  31.0* 33.0*  --  28.8*  --   --   PLT 303  --   --   --   --   --   --  288  --   --   APTT  --    < >  --  42*  --   --    < > 53* 52* 56*  LABPROT  --   --   --  18.9*  --   --   --   --   --   --   INR  --   --   --  1.6*  --   --   --   --   --   --   HEPARINUNFRC  --   --  0.54  --   --   --   --  0.68  --   --   CREATININE 2.21*  --   --  2.45*  --   --   --  2.07*  --  2.66*   < > = values in this interval not displayed.     Estimated Creatinine Clearance: 42.8 mL/min (A) (by C-G formula based on SCr of 2.66 mg/dL (H)).   Assessment: 41 yo M w/ PMH afib who was prescribed apixaban at time of last discharge (but received therapy during his inpatient stay). Patient was unable to pick up outpatient Rx and therefore last dose was during his admission 8/29 (>48h).  -aPTT= 56 on 1900 units/hr   Goal of Therapy:  Heparin level 0.3-0.7 units/ml aPTT 66-102s Monitor platelets by anticoagulation protocol: Yes   Plan:  Increase heparin infusion rate to 2100 units.hr Heparin level and CBC in am  Hildred Laser, PharmD Clinical Pharmacist **Pharmacist phone directory can now be found on [email or web address] ([email or web address]).  Listed under [place].

## 2022-03-14 NOTE — Progress Notes (Signed)
ANTICOAGULATION CONSULT NOTE - Follow Up Consult  Pharmacy Consult for heparin Indication: atrial fibrillation  Labs: Recent Labs    03/13/22 1007 03/13/22 1151 03/13/22 1152 03/13/22 1410 03/13/22 1452 03/13/22 1806 03/13/22 1825 03/14/22 0251  HGB 10.5*  --   --   --  10.5* 11.2*  --  9.7*  HCT 34.4*  --   --   --  31.0* 33.0*  --  28.8*  PLT 303  --   --   --   --   --   --  288  APTT  --    < >  --  42*  --   --  37* 53*  LABPROT  --   --   --  18.9*  --   --   --   --   INR  --   --   --  1.6*  --   --   --   --   HEPARINUNFRC  --   --  0.54  --   --   --   --  0.68  CREATININE 2.21*  --   --  2.45*  --   --   --  2.07*   < > = values in this interval not displayed.    Assessment: 41yo male subtherapeutic on heparin after rate change; no infusion issues or signs of bleeding per RN.  Goal of Therapy:  aPTT 66-102 seconds   Plan:  Will increase heparin infusion by 2 units/kg/hr to 1700 units/hr and check level in 6-8 hours.    Wynona Neat, PharmD, BCPS  03/14/2022,4:20 AM

## 2022-03-14 NOTE — Progress Notes (Signed)
Advanced Heart Failure Rounding Note   Subjective:    Awake on vent following commands.   Now back in AF despite DC-CV yesterday and amio gtt.   On milrinone 0.5  NE 8   SCr 2.45 -> 2.07   Objective:   Weight Range:  Vital Signs:   Temp:  [97.6 F (36.4 C)-98.3 F (36.8 C)] 98.3 F (36.8 C) (09/02 0748) Pulse Rate:  [65-130] 128 (09/02 0800) Resp:  [12-37] 12 (09/02 0800) BP: (77-144)/(64-127) 130/85 (09/02 0735) SpO2:  [94 %-100 %] 94 % (09/02 0835) Arterial Line BP: (71-145)/(49-98) 144/98 (09/02 0800) FiO2 (%):  [30 %-100 %] 30 % (09/02 0735) Last BM Date :  (PTA)  Weight change: Filed Weights   03/13/22 0928  Weight: 95.3 kg    Intake/Output:   Intake/Output Summary (Last 24 hours) at 03/14/2022 1108 Last data filed at 03/14/2022 1000 Gross per 24 hour  Intake 2150.67 ml  Output 4000 ml  Net -1849.33 ml     Physical Exam: General:  Awake on vent Follows commands HEENT: normal + ETT Neck: supple. JVP to ear  . Carotids 2+ bilat; no bruits. No lymphadenopathy or thryomegaly appreciated. Cor: Irreg tachy 2/6 TR. +s3 + RV lift  Lungs: clear Abdomen: soft, nontender, nondistended. No hepatosplenomegaly. No bruits or masses. Good bowel sounds. Extremities: no cyanosis, clubbing, rash, 1+ edema + UNNA Neuro: Awake on vent Follows commands  Telemetry: AF 115-120 Personally reviewed   Labs: Basic Metabolic Panel: Recent Labs  Lab 03/13/22 1007 03/13/22 1410 03/13/22 1452 03/13/22 1806 03/14/22 0251  NA 133* 130* 131* 131* 134*  K 3.6 4.4 4.2 4.5 3.6  CL 97* 95*  --   --  99  CO2 18* 19*  --   --  22  GLUCOSE 225* 344*  --   --  126*  BUN 29* 29*  --   --  29*  CREATININE 2.21* 2.45*  --   --  2.07*  CALCIUM 8.6* 8.5*  --   --  8.5*  MG 2.3  --   --   --   --     Liver Function Tests: Recent Labs  Lab 03/13/22 1009 03/13/22 1410  AST 31 43*  ALT 25 28  ALKPHOS 112 98  BILITOT 1.4* 1.4*  PROT 7.0 6.3*  ALBUMIN 3.1* 2.7*   No  results for input(s): "LIPASE", "AMYLASE" in the last 168 hours. No results for input(s): "AMMONIA" in the last 168 hours.  CBC: Recent Labs  Lab 03/13/22 1007 03/13/22 1452 03/13/22 1806 03/14/22 0251  WBC 9.2  --   --  9.1  HGB 10.5* 10.5* 11.2* 9.7*  HCT 34.4* 31.0* 33.0* 28.8*  MCV 90.3  --   --  83.5  PLT 303  --   --  288    Cardiac Enzymes: No results for input(s): "CKTOTAL", "CKMB", "CKMBINDEX", "TROPONINI" in the last 168 hours.  BNP: BNP (last 3 results) Recent Labs    02/19/22 1002 03/13/22 1007 03/13/22 1410  BNP 3,100.6* 2,469.0* 3,264.6*    ProBNP (last 3 results) No results for input(s): "PROBNP" in the last 8760 hours.    Other results:  Imaging: DG Chest Port 1 View  Result Date: 03/14/2022 CLINICAL DATA:  Intubated patient.  Follow-up study. EXAM: PORTABLE CHEST 1 VIEW COMPARISON:  03/13/2022 and older exams. FINDINGS: Stable changes from previous cardiac surgery and valve replacement. Stable enlargement of the cardiopericardial silhouette. Improved vascular congestion. No lung consolidation or convincing edema.  Small bilateral effusions. No pneumothorax. Endotracheal tube, nasal/orogastric tube and left PICC are stable. IMPRESSION: 1. Improved lung aeration compared to the previous day's exam. Decreased vascular congestion. No current evidence pulmonary edema. Small persistent pleural effusions. 2. Stable support apparatus. Electronically Signed   By: Lajean Manes M.D.   On: 03/14/2022 08:27   ECHOCARDIOGRAM COMPLETE  Result Date: 03/13/2022    ECHOCARDIOGRAM REPORT   Patient Name:   WILKINS ELPERS Date of Exam: 03/13/2022 Medical Rec #:  [id number]         Height:       70.0 in Accession #:    [id number]        Weight:       210.1 lb Date of Birth:  11/06/1980         BSA:          2.131 m Patient Age:    6 years          BP:           135/115 mmHg Patient Gender: M                 HR:           100 bpm. Exam Location:  Inpatient Procedure: 2D Echo,  Cardiac Doppler and Color Doppler Indications:    Congestive heart failure  History:        Patient has prior history of Echocardiogram examinations, most                 recent 01/20/2022. CHF; Risk Factors:Hypertension.  Sonographer:    Jefferey Pica Referring Phys: [phone] AMY D CLEGG IMPRESSIONS  1. Left ventricular ejection fraction, by estimation, is <20%. The left ventricle has severely decreased function. The left ventricle demonstrates global hypokinesis. The left ventricular internal cavity size was severely dilated. There is mild left ventricular hypertrophy. Left ventricular diastolic parameters are consistent with Grade III diastolic dysfunction (restrictive).  2. Right ventricular systolic function is severely reduced. The right ventricular size is normal. There is normal pulmonary artery systolic pressure. The estimated right ventricular systolic pressure is 85.2 mmHg.  3. Left atrial size was mildly dilated.  4. Right atrial size was mildly dilated.  5. The mitral valve is normal in structure. Mild to moderate mitral valve regurgitation. No evidence of mitral stenosis.  6. Bioprosthetic aortic valve. Mean gradient 6 mmHg with no significant perivalvular leakage.  7. Aortic dilatation noted. There is mild dilatation of the ascending aorta, measuring 43 mm.  8. The inferior vena cava is dilated in size with <50% respiratory variability, suggesting right atrial pressure of 15 mmHg. FINDINGS  Left Ventricle: Left ventricular ejection fraction, by estimation, is <20%. The left ventricle has severely decreased function. The left ventricle demonstrates global hypokinesis. The left ventricular internal cavity size was severely dilated. There is mild left ventricular hypertrophy. Left ventricular diastolic parameters are consistent with Grade III diastolic dysfunction (restrictive). Right Ventricle: The right ventricular size is normal. No increase in right ventricular wall thickness. Right ventricular  systolic function is severely reduced. There is normal pulmonary artery systolic pressure. The tricuspid regurgitant velocity is 2.13 m/s, and with an assumed right atrial pressure of 15 mmHg, the estimated right ventricular systolic pressure is 77.8 mmHg. Left Atrium: Left atrial size was mildly dilated. Right Atrium: Right atrial size was mildly dilated. Pericardium: There is no evidence of pericardial effusion. Mitral Valve: The mitral valve is normal in structure. Mild to moderate mitral valve regurgitation. No evidence  of mitral valve stenosis. Tricuspid Valve: The tricuspid valve is normal in structure. Tricuspid valve regurgitation is mild. Aortic Valve: Bioprosthetic aortic valve. Mean gradient 6 mmHg with no significant perivalvular leakage. The aortic valve has been repaired/replaced. Aortic valve regurgitation is not visualized. Aortic valve mean gradient measures 6.0 mmHg. Aortic valve  peak gradient measures 12.2 mmHg. Aortic valve area, by VTI measures 2.71 cm. Pulmonic Valve: The pulmonic valve was normal in structure. Pulmonic valve regurgitation is mild. Aorta: The aortic root is normal in size and structure and aortic dilatation noted. There is mild dilatation of the ascending aorta, measuring 43 mm. Venous: The inferior vena cava is dilated in size with less than 50% respiratory variability, suggesting right atrial pressure of 15 mmHg. IAS/Shunts: No atrial level shunt detected by color flow Doppler.  LEFT VENTRICLE PLAX 2D LVIDd:         7.10 cm LVIDs:         6.20 cm LV PW:         1.30 cm LV IVS:        1.20 cm LVOT diam:     2.40 cm LV SV:         63 LV SV Index:   30 LVOT Area:     4.52 cm  RIGHT VENTRICLE          IVC RV Basal diam:  3.10 cm  IVC diam: 3.20 cm LEFT ATRIUM              Index        RIGHT ATRIUM           Index LA diam:        5.10 cm  2.39 cm/m   RA Area:     22.60 cm LA Vol (A2C):   144.0 ml 67.56 ml/m  RA Volume:   68.50 ml  32.14 ml/m LA Vol (A4C):   109.0 ml 51.14  ml/m LA Biplane Vol: 130.0 ml 60.99 ml/m  AORTIC VALVE                     PULMONIC VALVE AV Area (Vmax):    2.62 cm      PV Vmax:       0.86 m/s AV Area (Vmean):   2.41 cm      PV Peak grad:  3.0 mmHg AV Area (VTI):     2.71 cm AV Vmax:           174.67 cm/s AV Vmean:          113.000 cm/s AV VTI:            0.232 m AV Peak Grad:      12.2 mmHg AV Mean Grad:      6.0 mmHg LVOT Vmax:         101.00 cm/s LVOT Vmean:        60.200 cm/s LVOT VTI:          0.139 m LVOT/AV VTI ratio: 0.60  AORTA Ao Root diam: 3.40 cm Ao Asc diam:  4.30 cm MITRAL VALVE               TRICUSPID VALVE MV Area (PHT): 6.71 cm    TR Peak grad:   18.1 mmHg MV Decel Time: 113 msec    TR Vmax:        213.00 cm/s MR Peak grad: 61.5 mmHg MR Vmax:      392.00 cm/s  SHUNTS MV  E velocity: 98.50 cm/s  Systemic VTI:  0.14 m                            Systemic Diam: 2.40 cm Dalton McleanMD Electronically signed by Franki Monte Signature Date/Time: 03/13/2022/4:02:09 PM    Final    DG Chest Port 1 View  Result Date: 03/13/2022 CLINICAL DATA:  Intubated EXAM: PORTABLE CHEST 1 VIEW COMPARISON:  03/13/2022, 02/20/2022 FINDINGS: Rectangular support device obscures the thoracic inlet region. Endotracheal tube tip partially obscured by sternotomy changes and support devices. The tip appears to be about 4.4 cm superior to carina. Esophageal tube tip below the diaphragm but incompletely visualized. Sternotomy changes and valve prosthesis. Cardiomegaly with vascular congestion and bilateral effusions. Left greater than right basilar airspace disease. No pneumothorax IMPRESSION: 1. Endotracheal tube tip about 4.4 cm superior to carina 2. Cardiomegaly with vascular congestion, edema, and bilateral effusions without great change since radiograph earlier today but worse as compared with 02/20/2022 3. Similar left greater than right basilar airspace disease. Electronically Signed   By: Donavan Foil M.D.   On: 03/13/2022 15:47   DG Chest Portable 1  View  Result Date: 03/13/2022 CLINICAL DATA:  Status post intubation. EXAM: PORTABLE CHEST 1 VIEW COMPARISON:  February 20, 2022. FINDINGS: Stable cardiomegaly. Large amount of overlying external objects is noted obscuring evaluation of the chest. Endotracheal tube appears to be in grossly good position. Increased bilateral lung opacities are noted concerning for edema or multifocal inflammation. Small pleural effusions may be present. Bony thorax is unremarkable. Status post aortic valve repair. IMPRESSION: Significantly limited exam due to overlying external objects, although the endotracheal tube appears to be in grossly good position. Increased bilateral lung opacities as described above. Electronically Signed   By: Marijo Conception M.D.   On: 03/13/2022 10:08     Medications:     Scheduled Medications:  aspirin  81 mg Oral Daily   Chlorhexidine Gluconate Cloth  6 each Topical Daily   docusate  100 mg Per Tube BID   feeding supplement (PROSource TF20)  60 mL Per Tube Daily   feeding supplement (VITAL HIGH PROTEIN)  1,000 mL Per Tube Q24H   fentaNYL (SUBLIMAZE) injection  50 mcg Intravenous Once   hydrALAZINE  10 mg Intravenous Once   insulin aspart  0-15 Units Subcutaneous Q4H   mouth rinse  15 mL Mouth Rinse Q2H   pantoprazole  40 mg Per Tube Daily   polyethylene glycol  17 g Per Tube Daily   sodium chloride flush  3 mL Intravenous Q12H    Infusions:  sodium chloride     sodium chloride Stopped (03/13/22 1722)   amiodarone 60 mg/hr (03/14/22 1009)   dexmedetomidine (PRECEDEX) IV infusion Stopped (03/13/22 1719)   fentaNYL infusion INTRAVENOUS 150 mcg/hr (03/14/22 1000)   furosemide (LASIX) 200 mg in dextrose 5 % 100 mL (2 mg/mL) infusion 12 mg/hr (03/14/22 1000)   heparin 1,700 Units/hr (03/14/22 1000)   milrinone 0.5 mcg/kg/min (03/14/22 1000)   norepinephrine (LEVOPHED) Adult infusion 8 mcg/min (03/14/22 1000)    PRN Medications: sodium chloride, Place/Maintain arterial line  **AND** sodium chloride, acetaminophen, fentaNYL, ondansetron (ZOFRAN) IV, mouth rinse, sodium chloride flush   Assessment/Plan:   1. A/C Systolic Biventricular HF-->Cardiogenic Shock - -Repeat ECHO - LVEF 5-10%  RV severely reduced. IVC dilated. AVR ok - On home milrinone 0.5 mcg. + NE 8  Co-ox 81% - Continue with marked volume overloaded.  -  Continue lasix 12/hr. Add metolazone 5.  - Will plan to keep intubated and diurese today. Also increase amio. If remains in AF with DC-CV tomorrow with potential extubation tomorrow. D/w CCM  - Will need OHTx +/- kidney in near future   2. Acute Hypoxic Respiratory Failure -Intubated in ED. CCM consulted  - CXR improved - Would not extubated until volume status improved and hopefully back in NSR   3. A fib RVR -Had DC-CV 02/26/22--> SR - Emergent DC-CV 03/13/22 ->SR - Now back in AF - Increase amio gtt to 60. Rebolus 150 mg - Can repeat DC-CV in am if needed - Continue heparin   4. AkI on CKD Stage IIIb - SCr 2.2 -> 2.4 -> 2.1 - due to ATN/shock - continue hemodynamic support - daily BMET   5. S/P AVR  -H/O AV repair 2018 with complex valvuloplasty including plication of left coronary leaflet and aortic ring annuloplasty -AVR [facility] 02/2022  -Repeat Echo - valve ok.    CRITICAL CARE Performed by: Glori Bickers  Total critical care time: 45 minutes  Critical care time was exclusive of separately billable procedures and treating other patients.  Critical care was necessary to treat or prevent imminent or life-threatening deterioration.  Critical care was time spent personally by me (independent of midlevel providers or residents) on the following activities: development of treatment plan with patient and/or surrogate as well as nursing, discussions with consultants, evaluation of patient's response to treatment, examination of patient, obtaining history from patient or surrogate, ordering and performing treatments and interventions,  ordering and review of laboratory studies, ordering and review of radiographic studies, pulse oximetry and re-evaluation of patient's condition.      Length of Stay: 1   Glori Bickers MD 03/14/2022, 11:08 AM  Advanced Heart Failure Team Pager [phone] (M-F; [place])  Please contact [facility] for night-coverage after hours (4p -7a ) and weekends on [email or web address]

## 2022-03-14 NOTE — Progress Notes (Signed)
[facility] Progress Note Patient Name: [name] [name] DOB: [birth year] MRN: [id number]   Date of Service  03/14/2022  HPI/Events of Note  K 3.6 Cr 2.06 (improving) On lasix gtt 99m/h  eICU Interventions  K repleted per protocol     Intervention Category Minor Interventions: Electrolytes abnormality - evaluation and management  Harman Ferrin JRodman Pickle9/08/2021, 4:29 AM

## 2022-03-15 ENCOUNTER — Inpatient Hospital Stay (HOSPITAL_COMMUNITY): Payer: BC Managed Care – PPO

## 2022-03-15 DIAGNOSIS — I480 Paroxysmal atrial fibrillation: Secondary | ICD-10-CM | POA: Diagnosis not present

## 2022-03-15 DIAGNOSIS — R570 Cardiogenic shock: Secondary | ICD-10-CM | POA: Diagnosis not present

## 2022-03-15 DIAGNOSIS — I5023 Acute on chronic systolic (congestive) heart failure: Secondary | ICD-10-CM | POA: Diagnosis not present

## 2022-03-15 LAB — APTT
aPTT: 89 seconds — ABNORMAL HIGH (ref 24–36)
aPTT: 89 seconds — ABNORMAL HIGH (ref 24–36)

## 2022-03-15 LAB — CBC
HCT: 31 % — ABNORMAL LOW (ref 39.0–52.0)
Hemoglobin: 9.9 g/dL — ABNORMAL LOW (ref 13.0–17.0)
MCH: 27.6 pg (ref 26.0–34.0)
MCHC: 31.9 g/dL (ref 30.0–36.0)
MCV: 86.4 fL (ref 80.0–100.0)
Platelets: 302 10*3/uL (ref 150–400)
RBC: 3.59 MIL/uL — ABNORMAL LOW (ref 4.22–5.81)
RDW: 20.7 % — ABNORMAL HIGH (ref 11.5–15.5)
WBC: 8.9 10*3/uL (ref 4.0–10.5)
nRBC: 0.3 % — ABNORMAL HIGH (ref 0.0–0.2)

## 2022-03-15 LAB — BASIC METABOLIC PANEL
Anion gap: 16 — ABNORMAL HIGH (ref 5–15)
BUN: 39 mg/dL — ABNORMAL HIGH (ref 6–20)
CO2: 24 mmol/L (ref 22–32)
Calcium: 9.1 mg/dL (ref 8.9–10.3)
Chloride: 97 mmol/L — ABNORMAL LOW (ref 98–111)
Creatinine, Ser: 2.56 mg/dL — ABNORMAL HIGH (ref 0.61–1.24)
GFR, Estimated: 32 mL/min — ABNORMAL LOW (ref >60)
Glucose, Bld: 126 mg/dL — ABNORMAL HIGH (ref 70–99)
Potassium: 4.6 mmol/L (ref 3.5–5.1)
Sodium: 137 mmol/L (ref 135–145)

## 2022-03-15 LAB — HEPARIN LEVEL (UNFRACTIONATED): Heparin Unfractionated: 0.94 IU/mL — ABNORMAL HIGH (ref 0.30–0.70)

## 2022-03-15 LAB — COOXEMETRY PANEL
Carboxyhemoglobin: 2.2 % — ABNORMAL HIGH (ref 0.5–1.5)
Methemoglobin: <0.7 % (ref 0.0–1.5)
O2 Saturation: 69.7 %
Total hemoglobin: 10.2 g/dL — ABNORMAL LOW (ref 12.0–16.0)

## 2022-03-15 LAB — GLUCOSE, CAPILLARY
Glucose-Capillary: 111 mg/dL — ABNORMAL HIGH (ref 70–99)
Glucose-Capillary: 119 mg/dL — ABNORMAL HIGH (ref 70–99)
Glucose-Capillary: 149 mg/dL — ABNORMAL HIGH (ref 70–99)
Glucose-Capillary: 137 mg/dL — ABNORMAL HIGH (ref 70–99)
Glucose-Capillary: 77 mg/dL (ref 70–99)

## 2022-03-15 LAB — PHOSPHORUS
Phosphorus: 4.9 mg/dL — ABNORMAL HIGH (ref 2.5–4.6)
Phosphorus: 4.7 mg/dL — ABNORMAL HIGH (ref 2.5–4.6)

## 2022-03-15 LAB — MAGNESIUM
Magnesium: 1.9 mg/dL (ref 1.7–2.4)
Magnesium: 2.3 mg/dL (ref 1.7–2.4)

## 2022-03-15 MED ORDER — METOLAZONE 5 MG PO TABS
5.0000 mg | ORAL_TABLET | Freq: Once | ORAL | Status: AC
  Administered 2022-03-15: 5 mg
  Filled 2022-03-15: qty 1

## 2022-03-15 MED ORDER — MAGNESIUM SULFATE 2 GM/50ML IV SOLN
2.0000 g | Freq: Once | INTRAVENOUS | Status: AC
  Administered 2022-03-15: 2 g via INTRAVENOUS
  Filled 2022-03-15: qty 50

## 2022-03-15 MED ORDER — ONDANSETRON HCL 4 MG/2ML IJ SOLN: 4.0000 mg | Freq: Once | INTRAMUSCULAR | Status: DC

## 2022-03-15 MED ORDER — METOCLOPRAMIDE HCL 5 MG/ML IJ SOLN
5.0000 mg | Freq: Once | INTRAMUSCULAR | Status: AC
  Administered 2022-03-15: 5 mg via INTRAVENOUS
  Filled 2022-03-15: qty 2

## 2022-03-15 MED ORDER — MIDAZOLAM HCL 2 MG/2ML IJ SOLN
2.0000 mg | Freq: Once | INTRAMUSCULAR | Status: AC
  Administered 2022-03-15: 2 mg via INTRAVENOUS
  Filled 2022-03-15: qty 2

## 2022-03-15 MED ORDER — ETOMIDATE 2 MG/ML IV SOLN
20.0000 mg | Freq: Once | INTRAVENOUS | Status: AC
Start: 2022-03-15 — End: 2022-03-15
  Administered 2022-03-15: 20 mg via INTRAVENOUS
  Filled 2022-03-15: qty 10

## 2022-03-15 NOTE — Progress Notes (Signed)
ANTICOAGULATION CONSULT NOTE - Follow Up Consult  Pharmacy Consult for heparin Indication: atrial fibrillation  No Known Allergies  Patient Measurements: Height: '5\' 10"'$  (177.8 cm) Weight: 89.8 kg (197 lb 15.6 oz) IBW/kg (Calculated) : 73 Heparin Dosing Weight: 92.5kg  Vital Signs: Temp: 98.5 F (36.9 C) (09/03 1628) Temp Source: Oral (09/03 1628) BP: 140/89 (09/03 1543) Pulse Rate: 103 (09/03 1543)  Labs: Recent Labs    03/13/22 1007 03/13/22 1151 03/13/22 1152 03/13/22 1410 03/13/22 1452 03/13/22 1806 03/13/22 1825 03/14/22 0251 03/14/22 1007 03/14/22 1956 03/15/22 0500 03/15/22 0528 03/15/22 1659  HGB 10.5*  --   --   --    < > 11.2*  --  9.7*  --   --  9.9*  --   --   HCT 34.4*  --   --   --    < > 33.0*  --  28.8*  --   --  31.0*  --   --   PLT 303  --   --   --   --   --   --  288  --   --  302  --   --   APTT  --    < >  --  42*  --   --    < > 53*   < > 56* 89*  --  89*  LABPROT  --   --   --  18.9*  --   --   --   --   --   --   --   --   --   INR  --   --   --  1.6*  --   --   --   --   --   --   --   --   --   HEPARINUNFRC  --   --  0.54  --   --   --   --  0.68  --   --   --  0.94*  --   CREATININE 2.21*  --   --  2.45*  --   --   --  2.07*  --  2.66* 2.56*  --   --    < > = values in this interval not displayed.     Estimated Creatinine Clearance: 43.2 mL/min (A) (by C-G formula based on SCr of 2.56 mg/dL (H)).   Assessment: 41 yo M w/ PMH afib who was prescribed apixaban at time of last discharge (but received therapy during his inpatient stay). Patient was unable to pick up outpatient Rx and therefore last dose was during his admission 8/29 (>48h).    APTT came back therapeutic again tonight. We will cont with current rate and check in AM.   Goal of Therapy:  Heparin level 0.3-0.7 units/ml aPTT 66-102s Monitor platelets by anticoagulation protocol: Yes   Plan:  Cont heparin infusion rate 2100 units.hr Heparin level, PTT and CBC in  am  Onnie Boer, PharmD, [place], AAHIVP, CPP Infectious Disease Pharmacist 03/15/2022 6:34 PM

## 2022-03-15 NOTE — CV Procedure (Signed)
    DIRECT CURRENT CARDIOVERSION  NAME:  [name] [name]   MRN: [id number] DOB:  01/05/1981   ADMIT DATE: 03/13/2022   INDICATIONS: Atrial fibrillation    PROCEDURE:   Informed consent was obtained prior to the procedure. The risks, benefits and alternatives for the procedure were discussed and the patient comprehended these risks. Once an appropriate time out was taken, the patient had the defibrillator pads placed in the anterior and posterior position. The patient then underwent sedation by the anesthesia service. Once an appropriate level of sedation was achieved, the patient received a single biphasic, synchronized 200J shock. Patient remained in AF. The pads were then repositioned and cardioversion repeated with manual pressure on anterior pad. Converted to sinus tach. No apparent complications.  Glori Bickers, MD  11:09 AM

## 2022-03-15 NOTE — Progress Notes (Signed)
eLink Physician-Brief Progress Note Patient Name: [name] [name] DOB: 18-Feb-1981 MRN: [id number]   Date of Service  03/15/2022  HPI/Events of Note  Hiccoughs , on ventilator, already on SUP-PPI. Qtc prolonged.  - IV low dose Reglan ordered. Watch for arhythmia.   eICU Interventions       Intervention Category Intermediate Interventions: Other: (hiccouhs)  Elmer Sow 03/15/2022, 10:43 PM

## 2022-03-15 NOTE — Progress Notes (Signed)
Advanced Heart Failure Rounding Note   Subjective:    Awake on vent following commands.   Remains in AF with RVR. Despite IV amio at 60.   On milrinone 0.5  NE 2  Co-ox 70%  On lasix gtt at 20.  2.3L out but still positive. CVP 23  SCr 2.45 -> 2.07 -> 2.56   Objective:   Weight Range:  Vital Signs:   Temp:  [98.6 F (37 C)-99.3 F (37.4 C)] 98.6 F (37 C) (09/03 0744) Pulse Rate:  [86-158] 121 (09/03 0736) Resp:  [20-28] 22 (09/03 0736) BP: (103-143)/(75-103) 143/94 (09/03 0736) SpO2:  [89 %-98 %] 98 % (09/03 0736) Arterial Line BP: (104-145)/(79-111) 136/104 (09/03 0630) FiO2 (%):  [30 %] 30 % (09/03 0736) Weight:  [89.8 kg] 89.8 kg (09/03 0500) Last BM Date :  (PTA)  Weight change: Filed Weights   03/13/22 0928 03/15/22 0500  Weight: 95.3 kg 89.8 kg    Intake/Output:   Intake/Output Summary (Last 24 hours) at 03/15/2022 1100 Last data filed at 03/15/2022 0600 Gross per 24 hour  Intake 2308.13 ml  Output 1975 ml  Net 333.13 ml      Physical Exam: General:  Awake on vent Follows commands HEENT: normal + ETT Neck: supple. no JVD. Carotids 2+ bilat; no bruits. No lymphadenopathy or thryomegaly appreciated. Cor: PMI nondisplaced. Irregular tachy. +S3 + RV lift Lungs: clear Abdomen: soft, nontender, nondistended. No hepatosplenomegaly. No bruits or masses. Good bowel sounds. Extremities: no cyanosis, clubbing, rash, 1+ edema Neuro: awake on vent. Follows all commands   Telemetry: AF 140-150 Personally reviewed   Labs: Basic Metabolic Panel: Recent Labs  Lab 03/13/22 1007 03/13/22 1410 03/13/22 1452 03/13/22 1806 03/14/22 0251 03/14/22 1007 03/14/22 1642 03/14/22 1956 03/15/22 0500  NA 133* 130* 131* 131* 134*  --   --  135 137  K 3.6 4.4 4.2 4.5 3.6  --   --  4.9 4.6  CL 97* 95*  --   --  99  --   --  101 97*  CO2 18* 19*  --   --  22  --   --  20* 24  GLUCOSE 225* 344*  --   --  126*  --   --  120* 126*  BUN 29* 29*  --   --  29*  --    --  38* 39*  CREATININE 2.21* 2.45*  --   --  2.07*  --   --  2.66* 2.56*  CALCIUM 8.6* 8.5*  --   --  8.5*  --   --  9.0 9.1  MG 2.3  --   --   --   --  1.9 2.1  --  1.9  PHOS  --   --   --   --   --  3.8 5.2*  --  4.9*     Liver Function Tests: Recent Labs  Lab 03/13/22 1009 03/13/22 1410  AST 31 43*  ALT 25 28  ALKPHOS 112 98  BILITOT 1.4* 1.4*  PROT 7.0 6.3*  ALBUMIN 3.1* 2.7*    No results for input(s): "LIPASE", "AMYLASE" in the last 168 hours. No results for input(s): "AMMONIA" in the last 168 hours.  CBC: Recent Labs  Lab 03/13/22 1007 03/13/22 1452 03/13/22 1806 03/14/22 0251 03/15/22 0500  WBC 9.2  --   --  9.1 8.9  HGB 10.5* 10.5* 11.2* 9.7* 9.9*  HCT 34.4* 31.0* 33.0* 28.8* 31.0*  MCV 90.3  --   --  83.5 86.4  PLT 303  --   --  288 302     Cardiac Enzymes: No results for input(s): "CKTOTAL", "CKMB", "CKMBINDEX", "TROPONINI" in the last 168 hours.  BNP: BNP (last 3 results) Recent Labs    02/19/22 1002 03/13/22 1007 03/13/22 1410  BNP 3,100.6* 2,469.0* 3,264.6*     ProBNP (last 3 results) No results for input(s): "PROBNP" in the last 8760 hours.    Other results:  Imaging: DG CHEST PORT 1 VIEW  Result Date: 03/15/2022 CLINICAL DATA:  Heart failure.  Follow-up study. EXAM: PORTABLE CHEST 1 VIEW COMPARISON:  03/14/2022 and older exams. FINDINGS: Changes from recent cardiac surgery are stable. Cardiac silhouette is mildly enlarged. No mediastinal widening. Increased opacity at the right lung base compared to the previous day's study is consistent with pleural fluid and atelectasis. Stable left lung base opacity also consistent with pleural fluid and atelectasis. No evidence of pulmonary edema. No pneumothorax. Endotracheal tube, nasal/orogastric tube and left sided PICC are stable. IMPRESSION: 1. Increased opacity at the right lung base compared to the previous day's exam consistent with a combination of pleural fluid and atelectasis. 2. No other  change.  Stable support apparatus. Electronically Signed   By: Lajean Manes M.D.   On: 03/15/2022 08:22   DG Chest Port 1 View  Result Date: 03/14/2022 CLINICAL DATA:  Intubated patient.  Follow-up study. EXAM: PORTABLE CHEST 1 VIEW COMPARISON:  03/13/2022 and older exams. FINDINGS: Stable changes from previous cardiac surgery and valve replacement. Stable enlargement of the cardiopericardial silhouette. Improved vascular congestion. No lung consolidation or convincing edema. Small bilateral effusions. No pneumothorax. Endotracheal tube, nasal/orogastric tube and left PICC are stable. IMPRESSION: 1. Improved lung aeration compared to the previous day's exam. Decreased vascular congestion. No current evidence pulmonary edema. Small persistent pleural effusions. 2. Stable support apparatus. Electronically Signed   By: Lajean Manes M.D.   On: 03/14/2022 08:27   ECHOCARDIOGRAM COMPLETE  Result Date: 03/13/2022    ECHOCARDIOGRAM REPORT   Patient Name:   [name] [name] Date of Exam: 03/13/2022 Medical Rec #:  [id number]         Height:       70.0 in Accession #:    [id number]        Weight:       210.1 lb Date of Birth:  03-27-1981         BSA:          2.131 m Patient Age:    19 years          BP:           135/115 mmHg Patient Gender: M                 HR:           100 bpm. Exam Location:  Inpatient Procedure: 2D Echo, Cardiac Doppler and Color Doppler Indications:    Congestive heart failure  History:        Patient has prior history of Echocardiogram examinations, most                 recent 01/20/2022. CHF; Risk Factors:Hypertension.  Sonographer:    Jefferey Pica Referring Phys: [phone] AMY D CLEGG IMPRESSIONS  1. Left ventricular ejection fraction, by estimation, is <20%. The left ventricle has severely decreased function. The left ventricle demonstrates global hypokinesis. The left ventricular internal cavity size was severely dilated. There is mild left ventricular hypertrophy. Left ventricular  diastolic parameters are  consistent with Grade III diastolic dysfunction (restrictive).  2. Right ventricular systolic function is severely reduced. The right ventricular size is normal. There is normal pulmonary artery systolic pressure. The estimated right ventricular systolic pressure is 09.2 mmHg.  3. Left atrial size was mildly dilated.  4. Right atrial size was mildly dilated.  5. The mitral valve is normal in structure. Mild to moderate mitral valve regurgitation. No evidence of mitral stenosis.  6. Bioprosthetic aortic valve. Mean gradient 6 mmHg with no significant perivalvular leakage.  7. Aortic dilatation noted. There is mild dilatation of the ascending aorta, measuring 43 mm.  8. The inferior vena cava is dilated in size with <50% respiratory variability, suggesting right atrial pressure of 15 mmHg. FINDINGS  Left Ventricle: Left ventricular ejection fraction, by estimation, is <20%. The left ventricle has severely decreased function. The left ventricle demonstrates global hypokinesis. The left ventricular internal cavity size was severely dilated. There is mild left ventricular hypertrophy. Left ventricular diastolic parameters are consistent with Grade III diastolic dysfunction (restrictive). Right Ventricle: The right ventricular size is normal. No increase in right ventricular wall thickness. Right ventricular systolic function is severely reduced. There is normal pulmonary artery systolic pressure. The tricuspid regurgitant velocity is 2.13 m/s, and with an assumed right atrial pressure of 15 mmHg, the estimated right ventricular systolic pressure is 33.0 mmHg. Left Atrium: Left atrial size was mildly dilated. Right Atrium: Right atrial size was mildly dilated. Pericardium: There is no evidence of pericardial effusion. Mitral Valve: The mitral valve is normal in structure. Mild to moderate mitral valve regurgitation. No evidence of mitral valve stenosis. Tricuspid Valve: The tricuspid valve is  normal in structure. Tricuspid valve regurgitation is mild. Aortic Valve: Bioprosthetic aortic valve. Mean gradient 6 mmHg with no significant perivalvular leakage. The aortic valve has been repaired/replaced. Aortic valve regurgitation is not visualized. Aortic valve mean gradient measures 6.0 mmHg. Aortic valve  peak gradient measures 12.2 mmHg. Aortic valve area, by VTI measures 2.71 cm. Pulmonic Valve: The pulmonic valve was normal in structure. Pulmonic valve regurgitation is mild. Aorta: The aortic root is normal in size and structure and aortic dilatation noted. There is mild dilatation of the ascending aorta, measuring 43 mm. Venous: The inferior vena cava is dilated in size with less than 50% respiratory variability, suggesting right atrial pressure of 15 mmHg. IAS/Shunts: No atrial level shunt detected by color flow Doppler.  LEFT VENTRICLE PLAX 2D LVIDd:         7.10 cm LVIDs:         6.20 cm LV PW:         1.30 cm LV IVS:        1.20 cm LVOT diam:     2.40 cm LV SV:         63 LV SV Index:   30 LVOT Area:     4.52 cm  RIGHT VENTRICLE          IVC RV Basal diam:  3.10 cm  IVC diam: 3.20 cm LEFT ATRIUM              Index        RIGHT ATRIUM           Index LA diam:        5.10 cm  2.39 cm/m   RA Area:     22.60 cm LA Vol (A2C):   144.0 ml 67.56 ml/m  RA Volume:   68.50 ml  32.14 ml/m LA Vol (  A4C):   109.0 ml 51.14 ml/m LA Biplane Vol: 130.0 ml 60.99 ml/m  AORTIC VALVE                     PULMONIC VALVE AV Area (Vmax):    2.62 cm      PV Vmax:       0.86 m/s AV Area (Vmean):   2.41 cm      PV Peak grad:  3.0 mmHg AV Area (VTI):     2.71 cm AV Vmax:           174.67 cm/s AV Vmean:          113.000 cm/s AV VTI:            0.232 m AV Peak Grad:      12.2 mmHg AV Mean Grad:      6.0 mmHg LVOT Vmax:         101.00 cm/s LVOT Vmean:        60.200 cm/s LVOT VTI:          0.139 m LVOT/AV VTI ratio: 0.60  AORTA Ao Root diam: 3.40 cm Ao Asc diam:  4.30 cm MITRAL VALVE               TRICUSPID VALVE MV Area  (PHT): 6.71 cm    TR Peak grad:   18.1 mmHg MV Decel Time: 113 msec    TR Vmax:        213.00 cm/s MR Peak grad: 61.5 mmHg MR Vmax:      392.00 cm/s  SHUNTS MV E velocity: 98.50 cm/s  Systemic VTI:  0.14 m                            Systemic Diam: 2.40 cm Dalton McleanMD Electronically signed by Franki Monte Signature Date/Time: 03/13/2022/4:02:09 PM    Final    DG Chest Port 1 View  Result Date: 03/13/2022 CLINICAL DATA:  Intubated EXAM: PORTABLE CHEST 1 VIEW COMPARISON:  03/13/2022, 02/20/2022 FINDINGS: Rectangular support device obscures the thoracic inlet region. Endotracheal tube tip partially obscured by sternotomy changes and support devices. The tip appears to be about 4.4 cm superior to carina. Esophageal tube tip below the diaphragm but incompletely visualized. Sternotomy changes and valve prosthesis. Cardiomegaly with vascular congestion and bilateral effusions. Left greater than right basilar airspace disease. No pneumothorax IMPRESSION: 1. Endotracheal tube tip about 4.4 cm superior to carina 2. Cardiomegaly with vascular congestion, edema, and bilateral effusions without great change since radiograph earlier today but worse as compared with 02/20/2022 3. Similar left greater than right basilar airspace disease. Electronically Signed   By: Donavan Foil M.D.   On: 03/13/2022 15:47     Medications:     Scheduled Medications:  aspirin  81 mg Oral Daily   Chlorhexidine Gluconate Cloth  6 each Topical Daily   docusate  100 mg Per Tube BID   feeding supplement (PROSource TF20)  60 mL Per Tube Daily   fentaNYL (SUBLIMAZE) injection  50 mcg Intravenous Once   hydrALAZINE  10 mg Intravenous Once   insulin aspart  0-15 Units Subcutaneous Q4H   metolazone  5 mg Per Tube Once   mouth rinse  15 mL Mouth Rinse Q2H   pantoprazole  40 mg Per Tube Daily   polyethylene glycol  17 g Per Tube Daily   sodium chloride flush  3 mL Intravenous Q12H    Infusions:  sodium chloride     sodium chloride  Stopped (03/13/22 1722)   amiodarone 60 mg/hr (03/15/22 0600)   dexmedetomidine (PRECEDEX) IV infusion Stopped (03/13/22 1719)   feeding supplement (VITAL 1.5 CAL) Stopped (03/14/22 2358)   fentaNYL infusion INTRAVENOUS 175 mcg/hr (03/15/22 0950)   furosemide (LASIX) 200 mg in dextrose 5 % 100 mL (2 mg/mL) infusion 20 mg/hr (03/15/22 0600)   heparin 2,100 Units/hr (03/15/22 0600)   milrinone 0.5 mcg/kg/min (03/15/22 0600)   norepinephrine (LEVOPHED) Adult infusion 2 mcg/min (03/15/22 0600)    PRN Medications: sodium chloride, Place/Maintain arterial line **AND** sodium chloride, acetaminophen, fentaNYL, ondansetron (ZOFRAN) IV, mouth rinse, sodium chloride flush   Assessment/Plan:   1. A/C Systolic Biventricular HF-->Cardiogenic Shock - Repeat ECHO - LVEF 5-10%  RV severely reduced. IVC dilated. AVR ok - On home milrinone 0.5 mcg. + NE 2 Co-ox 70% - Continues with marked volume overloaded.  - Continue lasix 20/hr. Give metolazone again today - Will plan DC-CV today to see if this facilitates diuresis - Will leave intubated for now to make sure he can hold SR - Will need OHTx +/- kidney in near future   2. Acute Hypoxic Respiratory Failure - Intubated in ED. CCM consulted  - Would not extubated until volume status improved and remains in NSR - D/w Dr. [place] Bells (CCM)   3. A fib RVR -Had DC-CV 02/26/22--> SR - Emergent DC-CV 03/13/22 ->SR - Remains in AF on amio gtt to 60. - Repeat DC-CV today - Continue heparin   4. AkI on CKD Stage IIIb - SCr 2.2 -> 2.4 -> 2.1 -> 2.5 - due to ATN/shock - continue hemodynamic support - daily BMET   5. S/P AVR  -H/O AV repair 2018 with complex valvuloplasty including plication of left coronary leaflet and aortic ring annuloplasty -AVR [facility] 02/2022  -Repeat Echo - valve ok.    CRITICAL CARE Performed by: Glori Bickers  Total critical care time: 40 minutes  Critical care time was exclusive of separately billable procedures and treating  other patients.  Critical care was necessary to treat or prevent imminent or life-threatening deterioration.  Critical care was time spent personally by me (independent of midlevel providers or residents) on the following activities: development of treatment plan with patient and/or surrogate as well as nursing, discussions with consultants, evaluation of patient's response to treatment, examination of patient, obtaining history from patient or surrogate, ordering and performing treatments and interventions, ordering and review of laboratory studies, ordering and review of radiographic studies, pulse oximetry and re-evaluation of patient's condition.  Length of Stay: 2   Glori Bickers MD 03/15/2022, 11:00 AM  Advanced Heart Failure Team Pager [phone] (M-F; 7a - 4p)  Please contact [facility] for night-coverage after hours (4p -7a ) and weekends on [email or web address]

## 2022-03-15 NOTE — Progress Notes (Signed)
ANTICOAGULATION CONSULT NOTE - Follow Up Consult  Pharmacy Consult for heparin Indication: atrial fibrillation  Labs: Recent Labs    03/13/22 1007 03/13/22 1151 03/13/22 1152 03/13/22 1410 03/13/22 1452 03/13/22 1806 03/13/22 1825 03/14/22 0251 03/14/22 1007 03/14/22 1956 03/15/22 0500 03/15/22 0528  HGB 10.5*  --   --   --    < > 11.2*  --  9.7*  --   --  9.9*  --   HCT 34.4*  --   --   --    < > 33.0*  --  28.8*  --   --  31.0*  --   PLT 303  --   --   --   --   --   --  288  --   --  302  --   APTT  --    < >  --  42*  --   --    < > 53* 52* 56* 89*  --   LABPROT  --   --   --  18.9*  --   --   --   --   --   --   --   --   INR  --   --   --  1.6*  --   --   --   --   --   --   --   --   HEPARINUNFRC  --   --  0.54  --   --   --   --  0.68  --   --   --  0.94*  CREATININE 2.21*  --   --  2.45*  --   --   --  2.07*  --  2.66*  --   --    < > = values in this interval not displayed.    Assessment/Plan:  41yo male therapeutic on heparin after rate change. Will continue infusion at current rate of 2100 units/hr and confirm stable with additional PTT.   Wynona Neat, PharmD, BCPS  03/15/2022,6:32 AM

## 2022-03-15 NOTE — Progress Notes (Signed)
   NAME:  [name] [name], MRN:  [id number], DOB:  Mar 27, 1981, LOS: 2 ADMISSION DATE:  03/13/2022, CONSULTATION DATE:  9/1 REFERRING MD:  Marigene Ehlers, CHIEF COMPLAINT:  dyspnea   History of Present Illness:  40 y/o male with severe NICM who just underwent high risk AVR with bioprosthetic valve at [facility] on 9/13 d/c home on 8/29 admitted on 9/1 from [facility] with respiratory failure in setting of volume overload.   Required intubation, mechanical ventilation.   Pertinent  Medical History  Systolic congestive heart failure, CKD stage II, hypertension, hyperlipidemia, status post aortic valve repair, mitral regurgitation, and anxiety  Significant Hospital Events: Including procedures, antibiotic start and stop dates in addition to other pertinent events   9/1 for shortness of breath and palpitations found in decompensated heart failure requiring intubation greater than protection, cardioversion attempt by heart failure  Interim History / Subjective:   Passing SBT Remains in Atrial fibrillation Afebrile, WBC OK   Objective   Blood pressure (!) 143/94, pulse (!) 121, temperature 98.6 F (37 C), temperature source Oral, resp. rate (!) 22, height '5\' 10"'$  (1.778 m), weight 89.8 kg, SpO2 98 %. CVP:  [13 mmHg-28 mmHg] 20 mmHg  Vent Mode: PRVC FiO2 (%):  [30 %] 30 % Set Rate:  [0 bmp-20 bmp] 0 bmp Vt Set:  [580 mL] 580 mL PEEP:  [5 cmH20] 5 cmH20 Plateau Pressure:  [21 cmH20-22 cmH20] 21 cmH20   Intake/Output Summary (Last 24 hours) at 03/15/2022 7989 Last data filed at 03/15/2022 0600 Gross per 24 hour  Intake 2632.64 ml  Output 2350 ml  Net 282.64 ml   Filed Weights   03/13/22 0928 03/15/22 0500  Weight: 95.3 kg 89.8 kg    Examination:  General:  In bed on vent HENT: NCAT ETT in place PULM: CTA B, vent supported breathing CV: RRR, no mgr GI: BS+, soft, nontender MSK: normal bulk and tone Neuro: sedated on vent, wakes easily  CXR 9/3 > bilateral air space disease, right pleural effusion  noted  Resolved Hospital Problem list     Assessment & Plan:  Cardiogenic shock in setting of biventricular heart failure S/p bioprosthetic aortic valve replacement on 8/13 Mitral regurgitation Atrial fibrillation with RVR Amiodarone and heparin to continue Plan for cardioversion today Milrinone, norepinephrine per heart failure Lasix drip to continue  Acute hypoxemic respiratory failure due to acute pulmonary edema > improved Full mechanical vent support VAP prevention Daily WUA/SBT Likely extubation today after cardioversion  CKD, stage IV, stable Monitor BMET and UOP Replace electrolytes as needed  Hyperglycemia SSI  Hold tube feeding  Best Practice (right click and "Reselect all SmartList Selections" daily)   Diet/type: tubefeeds DVT prophylaxis: systemic heparin GI prophylaxis: PPI Lines: Central line and yes and it is still needed Foley:  Yes, and it is still needed Code Status:  full code Last date of multidisciplinary goals of care discussion [per primary]   Critical care time: 33 minutes    Roselie Awkward, MD [place] [facility] Pager: [phone] Cell: [phone] After 7:00 pm call Elink  [phone]

## 2022-03-16 ENCOUNTER — Inpatient Hospital Stay (HOSPITAL_COMMUNITY): Payer: BC Managed Care – PPO

## 2022-03-16 DIAGNOSIS — I5023 Acute on chronic systolic (congestive) heart failure: Secondary | ICD-10-CM | POA: Diagnosis not present

## 2022-03-16 DIAGNOSIS — R570 Cardiogenic shock: Secondary | ICD-10-CM | POA: Diagnosis not present

## 2022-03-16 LAB — CBC
HCT: 27.8 % — ABNORMAL LOW (ref 39.0–52.0)
Hemoglobin: 9.2 g/dL — ABNORMAL LOW (ref 13.0–17.0)
MCH: 27.8 pg (ref 26.0–34.0)
MCHC: 33.1 g/dL (ref 30.0–36.0)
MCV: 84 fL (ref 80.0–100.0)
Platelets: 261 10*3/uL (ref 150–400)
RBC: 3.31 MIL/uL — ABNORMAL LOW (ref 4.22–5.81)
RDW: 20.5 % — ABNORMAL HIGH (ref 11.5–15.5)
WBC: 8.7 10*3/uL (ref 4.0–10.5)
nRBC: 0 % (ref 0.0–0.2)

## 2022-03-16 LAB — COOXEMETRY PANEL
Carboxyhemoglobin: 1.5 % (ref 0.5–1.5)
Methemoglobin: 2.3 % — ABNORMAL HIGH (ref 0.0–1.5)
O2 Saturation: 72.2 %
Total hemoglobin: 8.4 g/dL — ABNORMAL LOW (ref 12.0–16.0)

## 2022-03-16 LAB — BASIC METABOLIC PANEL
Anion gap: 15 (ref 5–15)
BUN: 41 mg/dL — ABNORMAL HIGH (ref 6–20)
CO2: 24 mmol/L (ref 22–32)
Calcium: 8.9 mg/dL (ref 8.9–10.3)
Chloride: 94 mmol/L — ABNORMAL LOW (ref 98–111)
Creatinine, Ser: 2.65 mg/dL — ABNORMAL HIGH (ref 0.61–1.24)
GFR, Estimated: 30 mL/min — ABNORMAL LOW (ref >60)
Glucose, Bld: 109 mg/dL — ABNORMAL HIGH (ref 70–99)
Potassium: 3.7 mmol/L (ref 3.5–5.1)
Sodium: 133 mmol/L — ABNORMAL LOW (ref 135–145)

## 2022-03-16 LAB — HEPARIN LEVEL (UNFRACTIONATED): Heparin Unfractionated: 0.59 IU/mL (ref 0.30–0.70)

## 2022-03-16 LAB — GLUCOSE, CAPILLARY
Glucose-Capillary: 120 mg/dL — ABNORMAL HIGH (ref 70–99)
Glucose-Capillary: 121 mg/dL — ABNORMAL HIGH (ref 70–99)
Glucose-Capillary: 129 mg/dL — ABNORMAL HIGH (ref 70–99)
Glucose-Capillary: 131 mg/dL — ABNORMAL HIGH (ref 70–99)
Glucose-Capillary: 141 mg/dL — ABNORMAL HIGH (ref 70–99)
Glucose-Capillary: 154 mg/dL — ABNORMAL HIGH (ref 70–99)
Glucose-Capillary: 79 mg/dL (ref 70–99)

## 2022-03-16 LAB — APTT: aPTT: 57 seconds — ABNORMAL HIGH (ref 24–36)

## 2022-03-16 MED ORDER — ASPIRIN 81 MG PO CHEW
81.0000 mg | CHEWABLE_TABLET | Freq: Every day | ORAL | Status: DC
  Administered 2022-03-16: 81 mg
  Filled 2022-03-16 (×2): qty 1

## 2022-03-16 MED ORDER — ORAL CARE MOUTH RINSE: 15.0000 mL | OROMUCOSAL | Status: DC | PRN

## 2022-03-16 MED ORDER — POTASSIUM CHLORIDE 10 MEQ/100ML IV SOLN: 10.0000 meq | INTRAVENOUS | Status: DC

## 2022-03-16 MED ORDER — POTASSIUM CHLORIDE 20 MEQ PO PACK
20.0000 meq | PACK | Freq: Once | ORAL | Status: AC
Start: 2022-03-16 — End: 2022-03-16
  Administered 2022-03-16: 20 meq
  Filled 2022-03-16: qty 1

## 2022-03-16 NOTE — Progress Notes (Signed)
   NAME:  UNIQUE SEARFOSS, MRN:  [id number], DOB:  04-21-1981, LOS: 3 ADMISSION DATE:  03/13/2022, CONSULTATION DATE:  9/1 REFERRING MD:  Marigene Ehlers, CHIEF COMPLAINT:  dyspnea   History of Present Illness:  41 y/o male with severe NICM who just underwent high risk AVR with bioprosthetic valve at [facility] on 9/13 d/c home on 8/29 admitted on 9/1 from [facility] with respiratory failure in setting of volume overload.   Required intubation, mechanical ventilation.   Pertinent  Medical History  Systolic congestive heart failure, CKD stage II, hypertension, hyperlipidemia, status post aortic valve repair, mitral regurgitation, and anxiety  Significant Hospital Events: Including procedures, antibiotic start and stop dates in addition to other pertinent events   9/1 for shortness of breath and palpitations found in decompensated heart failure requiring intubation greater than protection, cardioversion attempt by heart failure; left double lumen PICC prior to admission 9/3 cardioversion, continued lasix infusion  Interim History / Subjective:   Cardioversion, diuresed 6L  Awake, alert, wants tube out  Objective   Blood pressure 120/65, pulse 76, temperature 98.5 F (36.9 C), temperature source Oral, resp. rate (!) 21, height '5\' 10"'$  (1.778 m), weight 83.4 kg, SpO2 98 %. CVP:  [5 mmHg-30 mmHg] 8 mmHg  Vent Mode: PRVC FiO2 (%):  [30 %] 30 % Set Rate:  [0 bmp-20 bmp] 20 bmp Vt Set:  [580 mL] 580 mL PEEP:  [5 cmH20] 5 cmH20 Plateau Pressure:  [18 cmH20-27 cmH20] 20 cmH20   Intake/Output Summary (Last 24 hours) at 03/16/2022 0734 Last data filed at 03/16/2022 0600 Gross per 24 hour  Intake 2597.4 ml  Output 9110 ml  Net -6512.6 ml   Filed Weights   03/13/22 0928 03/15/22 0500 03/16/22 0500  Weight: 95.3 kg 89.8 kg 83.4 kg    Examination:  General:  In bed on vent HENT: NCAT ETT in place PULM: CTA B, vent supported breathing CV: Tachy, irregular, no mgr GI: BS+, soft, nontender MSK: normal bulk and  tone Neuro: awake, following commands   CXR 9/4 > cardiomegaly, interstitial edema bases  Resolved Hospital Problem list     Assessment & Plan:  Cardiogenic shock in setting of biventricular heart failure S/p bioprosthetic aortic valve replacement on 8/13 Mitral regurgitation Atrial fibrillation with RVR Amiodarone and heparin per primary service Milrinone, norepinephrine per heart failure Lasix drip per heart failure  Acute hypoxemic respiratory failure due to acute pulmonary edema > improved Extubation this morning Npo 4 hours, then bedside assessment Volume removal as tolerated  CKD, stage IV, stable Monitor BMET and UOP Replace electrolytes as needed  Hyperglycemia SSI    Best Practice (right click and "Reselect all SmartList Selections" daily)   Diet/type: tubefeeds DVT prophylaxis: systemic heparin GI prophylaxis: PPI Lines: Central line and yes and it is still needed (PICC from 7/31) Foley:  Yes, and it is still needed Code Status:  full code Last date of multidisciplinary goals of care discussion [per primary]   Critical care time: 32 minutes    Roselie Awkward, MD [place] [facility] Pager: [phone] Cell: [phone] After 7:00 pm call Elink  [phone]

## 2022-03-16 NOTE — Procedures (Signed)
Extubation Procedure Note  Patient Details:   Name: MEER REINDL DOB: 11/16/1980 MRN: [id number]   Airway Documentation:    Vent end date: 03/16/22 Vent end time: 1046   Evaluation  O2 sats: stable throughout and currently acceptable Complications: No apparent complications Patient did tolerate procedure well. Bilateral Breath Sounds: Clear, Diminished   Yes  Jesse Sans 03/16/2022, 11:45 AM

## 2022-03-16 NOTE — Progress Notes (Signed)
ANTICOAGULATION CONSULT NOTE - Follow Up Consult  Pharmacy Consult for heparin Indication: atrial fibrillation  No Known Allergies  Patient Measurements: Height: '5\' 10"'$  (177.8 cm) Weight: 83.4 kg (183 lb 13.8 oz) IBW/kg (Calculated) : 73 Heparin Dosing Weight: 92.5kg  Vital Signs: Temp: 98.9 F (37.2 C) (09/04 0700) Temp Source: Axillary (09/04 0700) BP: 108/65 (09/04 0739) Pulse Rate: 82 (09/04 0739)  Labs: Recent Labs    03/13/22 1410 03/13/22 1452 03/14/22 0251 03/14/22 1007 03/14/22 1956 03/15/22 0500 03/15/22 0528 03/15/22 1659 03/16/22 0447  HGB  --    < > 9.7*  --   --  9.9*  --   --  9.2*  HCT  --    < > 28.8*  --   --  31.0*  --   --  27.8*  PLT  --   --  288  --   --  302  --   --  261  APTT 42*   < > 53*   < > 56* 89*  --  89* 57*  LABPROT 18.9*  --   --   --   --   --   --   --   --   INR 1.6*  --   --   --   --   --   --   --   --   HEPARINUNFRC  --   --  0.68  --   --   --  0.94*  --  0.59  CREATININE 2.45*  --  2.07*  --  2.66* 2.56*  --   --  2.65*   < > = values in this interval not displayed.     Estimated Creatinine Clearance: 38.3 mL/min (A) (by C-G formula based on SCr of 2.65 mg/dL (H)).   Assessment: 41 yo M w/ PMH afib who was prescribed apixaban at time of last discharge (but received therapy during his inpatient stay). Patient was unable to pick up outpatient Rx and therefore last dose was during his admission 8/29 (>48h).   Previously he has been therapeutic on heparin 2100 units/hr with aPTT 89 x 2 different occurrences. This morning, aPTT subtherapeutic at 57. No issues with heparin infusion. Heparin level is therapeutic at 0.59 units/mL  It has now been at least 5 days since last Eliquis dose. Will consider today's aPTT and heparin level as correlating and will follow heparin levels moving forward.  Goal of Therapy:  Heparin level 0.3-0.7 units/ml Monitor platelets by anticoagulation protocol: Yes   Plan:  Cont heparin infusion  at 2100 units/hr Daily Heparin level, PTT and CBC   Erskine Speed, PharmD  Clinical Pharmacist 03/16/2022 10:03 AM

## 2022-03-16 NOTE — Progress Notes (Addendum)
Advanced Heart Failure Rounding Note   Subjective:    Underwent DCCV again yesterday for AF. Maintaining NSR this morning. On amio gtt at 60/hr.   On milrinone 0.5  NE 2  Co-ox 72%  On lasix gtt at 20. Got metolazone yesterday. Brisk diuresis, 9L out. Net negative 6.5L. Wt down 14 lb. CVP 12.   SCr 2.45 -> 2.07 -> 2.56 - >2.65   K 3.7   Remains intubated. Awake on vent. Follows commands. Gives thumbs up.    Objective:   Weight Range:  Vital Signs:   Temp:  [98.5 F (36.9 C)-99.6 F (37.6 C)] 98.5 F (36.9 C) (09/03 2200) Pulse Rate:  [64-155] 76 (09/04 0630) Resp:  [15-28] 21 (09/04 0630) BP: (120-150)/(65-98) 120/65 (09/04 0357) SpO2:  [91 %-100 %] 98 % (09/04 0630) Arterial Line BP: (90-155)/(54-103) 97/62 (09/04 0630) FiO2 (%):  [30 %] 30 % (09/04 0357) Weight:  [83.4 kg] 83.4 kg (09/04 0500) Last BM Date :  (PTA)  Weight change: Filed Weights   03/13/22 0928 03/15/22 0500 03/16/22 0500  Weight: 95.3 kg 89.8 kg 83.4 kg    Intake/Output:   Intake/Output Summary (Last 24 hours) at 03/16/2022 0755 Last data filed at 03/16/2022 0600 Gross per 24 hour  Intake 2597.4 ml  Output 9110 ml  Net -6512.6 ml    PHYSICAL EXAM: CVP 12  General:  thin male, intubated, awake on vent and following commands. No distress HEENT: normal + ETT  Neck: supple. JVD 12 cm. Carotids 2+ bilat; no bruits. No lymphadenopathy or thyromegaly appreciated. Cor: PMI nondisplaced. Regular rate & rhythm. + S3  Lungs: clear Abdomen: soft, nontender, nondistended. No hepatosplenomegaly. No bruits or masses. Good bowel sounds. Extremities: no cyanosis, clubbing, rash, trace b/l LE  Neuro: awake on vent, following commands  Telemetry: NSr 80s Personally reviewed   Labs: Basic Metabolic Panel: Recent Labs  Lab 03/13/22 1007 03/13/22 1410 03/13/22 1452 03/13/22 1806 03/14/22 0251 03/14/22 1007 03/14/22 1642 03/14/22 1956 03/15/22 0500 03/15/22 1659 03/16/22 0447  NA 133* 130*    < > 131* 134*  --   --  135 137  --  133*  K 3.6 4.4   < > 4.5 3.6  --   --  4.9 4.6  --  3.7  CL 97* 95*  --   --  99  --   --  101 97*  --  94*  CO2 18* 19*  --   --  22  --   --  20* 24  --  24  GLUCOSE 225* 344*  --   --  126*  --   --  120* 126*  --  109*  BUN 29* 29*  --   --  29*  --   --  38* 39*  --  41*  CREATININE 2.21* 2.45*  --   --  2.07*  --   --  2.66* 2.56*  --  2.65*  CALCIUM 8.6* 8.5*  --   --  8.5*  --   --  9.0 9.1  --  8.9  MG 2.3  --   --   --   --  1.9 2.1  --  1.9 2.3  --   PHOS  --   --   --   --   --  3.8 5.2*  --  4.9* 4.7*  --    < > = values in this interval not displayed.    Liver Function Tests:  Recent Labs  Lab 03/13/22 1009 03/13/22 1410  AST 31 43*  ALT 25 28  ALKPHOS 112 98  BILITOT 1.4* 1.4*  PROT 7.0 6.3*  ALBUMIN 3.1* 2.7*   No results for input(s): "LIPASE", "AMYLASE" in the last 168 hours. No results for input(s): "AMMONIA" in the last 168 hours.  CBC: Recent Labs  Lab 03/13/22 1007 03/13/22 1452 03/13/22 1806 03/14/22 0251 03/15/22 0500 03/16/22 0447  WBC 9.2  --   --  9.1 8.9 8.7  HGB 10.5* 10.5* 11.2* 9.7* 9.9* 9.2*  HCT 34.4* 31.0* 33.0* 28.8* 31.0* 27.8*  MCV 90.3  --   --  83.5 86.4 84.0  PLT 303  --   --  288 302 261    Cardiac Enzymes: No results for input(s): "CKTOTAL", "CKMB", "CKMBINDEX", "TROPONINI" in the last 168 hours.  BNP: BNP (last 3 results) Recent Labs    02/19/22 1002 03/13/22 1007 03/13/22 1410  BNP 3,100.6* 2,469.0* 3,264.6*    ProBNP (last 3 results) No results for input(s): "PROBNP" in the last 8760 hours.    Other results:  Imaging: DG CHEST PORT 1 VIEW  Result Date: 03/15/2022 CLINICAL DATA:  Heart failure.  Follow-up study. EXAM: PORTABLE CHEST 1 VIEW COMPARISON:  03/14/2022 and older exams. FINDINGS: Changes from recent cardiac surgery are stable. Cardiac silhouette is mildly enlarged. No mediastinal widening. Increased opacity at the right lung base compared to the previous day's  study is consistent with pleural fluid and atelectasis. Stable left lung base opacity also consistent with pleural fluid and atelectasis. No evidence of pulmonary edema. No pneumothorax. Endotracheal tube, nasal/orogastric tube and left sided PICC are stable. IMPRESSION: 1. Increased opacity at the right lung base compared to the previous day's exam consistent with a combination of pleural fluid and atelectasis. 2. No other change.  Stable support apparatus. Electronically Signed   By: Lajean Manes M.D.   On: 03/15/2022 08:22     Medications:     Scheduled Medications:  aspirin  81 mg Oral Daily   Chlorhexidine Gluconate Cloth  6 each Topical Daily   docusate  100 mg Per Tube BID   feeding supplement (PROSource TF20)  60 mL Per Tube Daily   fentaNYL (SUBLIMAZE) injection  50 mcg Intravenous Once   hydrALAZINE  10 mg Intravenous Once   insulin aspart  0-15 Units Subcutaneous Q4H   mouth rinse  15 mL Mouth Rinse Q2H   pantoprazole  40 mg Per Tube Daily   polyethylene glycol  17 g Per Tube Daily   sodium chloride flush  3 mL Intravenous Q12H    Infusions:  sodium chloride     sodium chloride Stopped (03/15/22 1704)   amiodarone 60 mg/hr (03/16/22 0629)   dexmedetomidine (PRECEDEX) IV infusion Stopped (03/16/22 0521)   feeding supplement (VITAL 1.5 CAL) Stopped (03/14/22 2358)   fentaNYL infusion INTRAVENOUS 200 mcg/hr (03/16/22 0600)   furosemide (LASIX) 200 mg in dextrose 5 % 100 mL (2 mg/mL) infusion 20 mg/hr (03/16/22 0600)   heparin 2,100 Units/hr (03/16/22 0600)   milrinone 0.5 mcg/kg/min (03/16/22 0600)   norepinephrine (LEVOPHED) Adult infusion 2 mcg/min (03/16/22 0600)   potassium chloride      PRN Medications: sodium chloride, Place/Maintain arterial line **AND** sodium chloride, acetaminophen, fentaNYL, ondansetron (ZOFRAN) IV, mouth rinse, sodium chloride flush   Assessment/Plan:   1. A/C Systolic Biventricular HF-->Cardiogenic Shock - Repeat ECHO - LVEF 5-10%  RV  severely reduced. IVC dilated. AVR ok - On home milrinone 0.5 mcg. +  NE 2 Co-ox 72% - Volume improving w/ diuresis, CVP 12  - w/ RV failure, will drop lasix gtt rate. No metolazone today  - Will leave intubated for now to make sure he can hold SR - GDMT limited by renal fx - monitor BP, may need nitrate/hydralazine soon  - Will need OHTx +/- kidney in near future   2. Acute Hypoxic Respiratory Failure - Intubated in ED. CCM consulted  - Would not extubated until volume status improved and remains in NSR    3. A fib RVR -Had DC-CV 02/26/22--> SR -Emergent DC-CV 03/13/22 ->SR -Repeat DCCV 9/3  - NSR this morning, continue amio gtt at 60/hr  - Continue heparin   4. AkI on CKD Stage IIIb - SCr 2.2 -> 2.4 -> 2.1 -> 2.5 ->2.7 - due to ATN/shock - continue hemodynamic support - daily BMET   5. S/P AVR  -H/O AV repair 2018 with complex valvuloplasty including plication of left coronary leaflet and aortic ring annuloplasty -AVR [facility] 02/2022  -Repeat Echo - valve ok.    Length of Stay: 3   Brittainy Ladoris Gene  03/16/2022, 7:55 AM  Advanced Heart Failure Team Pager [phone] (M-F; 7a - 4p)  Please contact [facility] for night-coverage after hours (4p -7a ) and weekends on [email or web address]   Agree with above.   Remains intubated. Awake on vent and writing.   Remains in NSR on IV amio. Co-ox ok on NE 2 and milrinone 0.5  Excellent diuresis yesterday on IV lasix  General:  Awake on vent HEENT: normal + ETT Neck: supple. JVP to jaw  Carotids 2+ bilat; no bruits. No lymphadenopathy or thryomegaly appreciated. Cor: PMI nondisplaced. Regular tachy 2/6 SEM  Lungs: clear Abdomen: soft, nontender, nondistended. No hepatosplenomegaly. No bruits or masses. Good bowel sounds. Extremities: no cyanosis, clubbing, rash, edema Neuro: alert & orientedx3, cranial nerves grossly intact. moves all 4 extremities w/o difficulty. Affect pleasant  Volume status and hemodynamics much improved with  restoration of NSR. Plan extubation today (d/w CCM and RT personally). Agree with decreasing lasix rate. Continue heparin. Watch renal function closely.   Will need heart/kidney transplant in near future.   CRITICAL CARE Performed by: Glori Bickers  Total critical care time: 35 minutes  Critical care time was exclusive of separately billable procedures and treating other patients.  Critical care was necessary to treat or prevent imminent or life-threatening deterioration.  Critical care was time spent personally by me (independent of midlevel providers or residents) on the following activities: development of treatment plan with patient and/or surrogate as well as nursing, discussions with consultants, evaluation of patient's response to treatment, examination of patient, obtaining history from patient or surrogate, ordering and performing treatments and interventions, ordering and review of laboratory studies, ordering and review of radiographic studies, pulse oximetry and re-evaluation of patient's condition.  Glori Bickers, MD  10:37 AM

## 2022-03-16 NOTE — Evaluation (Addendum)
Physical Therapy Evaluation Patient Details Name: [name] [name] MRN: [id number] DOB: Aug 01, 1980 Today's Date: 03/16/2022  History of Present Illness  41 yo male admitted 9/1 from [place] with acute on chronic heart failure, pulmonary edema and respiratory failure requiring intubation 9/1. DCCV 9/3. Extubated 9/4. PMhx: recent AVR at [facility] in 02/21/22, biventricular heart failure, NICM, HTN, AKI, AVR 2018, hernia, DM  Clinical Impression  Pt pleasant and wanting to get OOB to bathroom and agreeable to walk. Pt very aware of his medical status, procedures and precautions. Pt with decreased strength, function and mobility who will benefit from acute therapy to maximize mobility and safety.     HR 108-130 during activity Spo2 92% on 2L     Recommendations for follow up therapy are one component of a multi-disciplinary discharge planning process, led by the attending physician.  Recommendations may be updated based on patient status, additional functional criteria and insurance authorization.  Follow Up Recommendations No PT follow up      Assistance Recommended at Discharge Intermittent Supervision/Assistance  Patient can return home with the following  A little help with walking and/or transfers;A little help with bathing/dressing/bathroom;Assistance with cooking/housework;Assist for transportation;Help with stairs or ramp for entrance    Equipment Recommendations None recommended by PT  Recommendations for Other Services       Functional Status Assessment Patient has had a recent decline in their functional status and demonstrates the ability to make significant improvements in function in a reasonable and predictable amount of time.     Precautions / Restrictions Precautions Precautions: Sternal;Other (comment) Precaution Booklet Issued: No Precaution Comments: watch HR and sats      Mobility  Bed Mobility Overal bed mobility: Needs Assistance Bed Mobility: Supine to Sit      Supine to sit: Supervision, HOB elevated     General bed mobility comments: HOB 40 degrees with supervision for lines and safety, pt aware and precautions and maintaining well    Transfers Overall transfer level: Needs assistance   Transfers: Sit to/from Stand Sit to Stand: Min guard           General transfer comment: guarding for lines and safety, appropriate hand placement    Ambulation/Gait Ambulation/Gait assistance: Min guard Gait Distance (Feet): 175 Feet Assistive device: Rolling walker (2 wheels) Gait Pattern/deviations: Step-through pattern, Decreased stride length   Gait velocity interpretation: 1.31 - 2.62 ft/sec, indicative of limited community ambulator   General Gait Details: pt with initial gait to bathroom without RW but then reported feeling weak with RW utilized for hall ambulation. +2 assist due to lines. HR 110-135 during gait, Afib  Stairs            Wheelchair Mobility    Modified Rankin (Stroke Patients Only)       Balance Overall balance assessment: Mild deficits observed, not formally tested                                           Pertinent Vitals/Pain Pain Assessment Pain Assessment: No/denies pain    Home Living Family/patient expects to be discharged to:: Private residence Living Arrangements: Spouse/significant other Available Help at Discharge: Family;Available 24 hours/day Type of Home: House Home Access: Stairs to enter   [employer] of Steps: 7   Home Layout: One level Home Equipment: None Additional Comments: lives at home with wife and 51yrtwin boys  Prior Function Prior Level of Function : Independent/Modified Independent                     Hand Dominance        Extremity/Trunk Assessment   Upper Extremity Assessment Upper Extremity Assessment: Overall WFL for tasks assessed    Lower Extremity Assessment Lower Extremity Assessment: Generalized weakness     Cervical / Trunk Assessment Cervical / Trunk Assessment: Normal  Communication   Communication: No difficulties  Cognition Arousal/Alertness: Awake/alert Behavior During Therapy: WFL for tasks assessed/performed Overall Cognitive Status: Within Functional Limits for tasks assessed                                          General Comments      Exercises     Assessment/Plan    PT Assessment Patient needs continued PT services  PT Problem List Decreased mobility;Decreased activity tolerance;Cardiopulmonary status limiting activity;Decreased balance       PT Treatment Interventions Gait training;Balance training;Stair training;Functional mobility training;Therapeutic activities;Patient/family education;DME instruction;Therapeutic exercise    PT Goals (Current goals can be found in the Care Plan section)  Acute Rehab PT Goals Patient Stated Goal: return home PT Goal Formulation: With patient Time For Goal Achievement: 03/30/22 Potential to Achieve Goals: Good    Frequency Min 3X/week     Co-evaluation               AM-PAC PT "6 Clicks" Mobility  Outcome Measure Help needed turning from your back to your side while in a flat bed without using bedrails?: A Little Help needed moving from lying on your back to sitting on the side of a flat bed without using bedrails?: A Little Help needed moving to and from a bed to a chair (including a wheelchair)?: A Little Help needed standing up from a chair using your arms (e.g., wheelchair or bedside chair)?: A Little Help needed to walk in hospital room?: A Little Help needed climbing 3-5 steps with a railing? : A Little 6 Click Score: 18    End of Session Equipment Utilized During Treatment: Oxygen Activity Tolerance: Patient tolerated treatment well Patient left: in chair;with call bell/phone within reach;with nursing/sitter in room Nurse Communication: Mobility status PT Visit Diagnosis: Other  abnormalities of gait and mobility (R26.89);Difficulty in walking, not elsewhere classified (R26.2);Muscle weakness (generalized) (M62.81)    Time: 1017-5102 PT Time Calculation (min) (ACUTE ONLY): 33 min   Charges:   PT Evaluation $PT Eval Moderate Complexity: 1 Mod PT Treatments $Gait Training: 8-22 mins        Bayard Males, PT Acute Rehabilitation Services Office: [place] 03/16/2022, 1:57 PM

## 2022-03-16 NOTE — Progress Notes (Signed)
Positive cuff leak noted prior to placing pt on SBT

## 2022-03-17 DIAGNOSIS — I509 Heart failure, unspecified: Secondary | ICD-10-CM | POA: Diagnosis not present

## 2022-03-17 DIAGNOSIS — J9601 Acute respiratory failure with hypoxia: Secondary | ICD-10-CM | POA: Diagnosis not present

## 2022-03-17 DIAGNOSIS — I5023 Acute on chronic systolic (congestive) heart failure: Secondary | ICD-10-CM | POA: Diagnosis not present

## 2022-03-17 DIAGNOSIS — I501 Left ventricular failure: Secondary | ICD-10-CM

## 2022-03-17 DIAGNOSIS — E876 Hypokalemia: Secondary | ICD-10-CM

## 2022-03-17 DIAGNOSIS — R110 Nausea: Secondary | ICD-10-CM

## 2022-03-17 DIAGNOSIS — R570 Cardiogenic shock: Secondary | ICD-10-CM | POA: Diagnosis not present

## 2022-03-17 DIAGNOSIS — E871 Hypo-osmolality and hyponatremia: Secondary | ICD-10-CM

## 2022-03-17 LAB — GLUCOSE, CAPILLARY
Glucose-Capillary: 111 mg/dL — ABNORMAL HIGH (ref 70–99)
Glucose-Capillary: 121 mg/dL — ABNORMAL HIGH (ref 70–99)
Glucose-Capillary: 126 mg/dL — ABNORMAL HIGH (ref 70–99)
Glucose-Capillary: 140 mg/dL — ABNORMAL HIGH (ref 70–99)
Glucose-Capillary: 170 mg/dL — ABNORMAL HIGH (ref 70–99)
Glucose-Capillary: 205 mg/dL — ABNORMAL HIGH (ref 70–99)
Glucose-Capillary: 92 mg/dL (ref 70–99)

## 2022-03-17 LAB — CBC
HCT: 29.9 % — ABNORMAL LOW (ref 39.0–52.0)
Hemoglobin: 10 g/dL — ABNORMAL LOW (ref 13.0–17.0)
MCH: 27.9 pg (ref 26.0–34.0)
MCHC: 33.4 g/dL (ref 30.0–36.0)
MCV: 83.3 fL (ref 80.0–100.0)
Platelets: 242 10*3/uL (ref 150–400)
RBC: 3.59 MIL/uL — ABNORMAL LOW (ref 4.22–5.81)
RDW: 20 % — ABNORMAL HIGH (ref 11.5–15.5)
WBC: 8.2 10*3/uL (ref 4.0–10.5)
nRBC: 0 % (ref 0.0–0.2)

## 2022-03-17 LAB — BASIC METABOLIC PANEL
Anion gap: 17 — ABNORMAL HIGH (ref 5–15)
BUN: 36 mg/dL — ABNORMAL HIGH (ref 6–20)
CO2: 29 mmol/L (ref 22–32)
Calcium: 9 mg/dL (ref 8.9–10.3)
Chloride: 85 mmol/L — ABNORMAL LOW (ref 98–111)
Creatinine, Ser: 2.58 mg/dL — ABNORMAL HIGH (ref 0.61–1.24)
GFR, Estimated: 31 mL/min — ABNORMAL LOW (ref >60)
Glucose, Bld: 136 mg/dL — ABNORMAL HIGH (ref 70–99)
Potassium: 3.3 mmol/L — ABNORMAL LOW (ref 3.5–5.1)
Sodium: 131 mmol/L — ABNORMAL LOW (ref 135–145)

## 2022-03-17 LAB — COOXEMETRY PANEL
Carboxyhemoglobin: 2.5 % — ABNORMAL HIGH (ref 0.5–1.5)
Carboxyhemoglobin: 1.6 % — ABNORMAL HIGH (ref 0.5–1.5)
Methemoglobin: <0.7 % (ref 0.0–1.5)
Methemoglobin: <0.7 % (ref 0.0–1.5)
O2 Saturation: 64.2 %
O2 Saturation: 59.3 %
Total hemoglobin: 7.1 g/dL — ABNORMAL LOW (ref 12.0–16.0)
Total hemoglobin: 10.4 g/dL — ABNORMAL LOW (ref 12.0–16.0)

## 2022-03-17 LAB — HEPARIN LEVEL (UNFRACTIONATED)
Heparin Unfractionated: 0.71 IU/mL — ABNORMAL HIGH (ref 0.30–0.70)
Heparin Unfractionated: 0.57 IU/mL (ref 0.30–0.70)

## 2022-03-17 LAB — MAGNESIUM: Magnesium: 2 mg/dL (ref 1.7–2.4)

## 2022-03-17 MED ORDER — POTASSIUM CHLORIDE CRYS ER 20 MEQ PO TBCR: 40.0000 meq | EXTENDED_RELEASE_TABLET | Freq: Once | ORAL | Status: DC

## 2022-03-17 MED ORDER — MAGNESIUM SULFATE 2 GM/50ML IV SOLN
2.0000 g | Freq: Once | INTRAVENOUS | Status: DC
  Filled 2022-03-17: qty 50

## 2022-03-17 MED ORDER — POTASSIUM CHLORIDE 20 MEQ PO PACK
40.0000 meq | PACK | Freq: Once | ORAL | Status: DC
Start: 2022-03-17 — End: 2022-03-17

## 2022-03-17 MED ORDER — POTASSIUM CHLORIDE 10 MEQ/50ML IV SOLN
10.0000 meq | INTRAVENOUS | Status: AC
  Administered 2022-03-17 (×2): 10 meq via INTRAVENOUS
  Filled 2022-03-17 (×2): qty 50

## 2022-03-17 MED ORDER — POTASSIUM CHLORIDE 20 MEQ PO PACK
40.0000 meq | PACK | Freq: Once | ORAL | Status: DC
Start: 2022-03-17 — End: 2022-03-17
  Administered 2022-03-17: 40 meq via ORAL
  Filled 2022-03-17: qty 2

## 2022-03-17 MED ORDER — ASPIRIN 81 MG PO CHEW
81.0000 mg | CHEWABLE_TABLET | Freq: Every day | ORAL | Status: DC
  Administered 2022-03-19 – 2022-03-23 (×5): 81 mg via ORAL
  Filled 2022-03-17 (×5): qty 1

## 2022-03-17 MED ORDER — AMIODARONE LOAD VIA INFUSION
150.0000 mg | Freq: Once | INTRAVENOUS | Status: AC
  Administered 2022-03-17: 150 mg via INTRAVENOUS
  Filled 2022-03-17: qty 83.34

## 2022-03-17 MED ORDER — POTASSIUM CHLORIDE CRYS ER 20 MEQ PO TBCR: 40.0000 meq | EXTENDED_RELEASE_TABLET | Freq: Two times a day (BID) | ORAL | Status: DC

## 2022-03-17 MED ORDER — POLYETHYLENE GLYCOL 3350 17 G PO PACK
17.0000 g | PACK | Freq: Every day | ORAL | Status: DC
  Administered 2022-03-17 – 2022-03-20 (×2): 17 g via ORAL
  Filled 2022-03-17 (×5): qty 1

## 2022-03-17 MED ORDER — SENNA 8.6 MG PO TABS
1.0000 | ORAL_TABLET | Freq: Every day | ORAL | Status: DC
Start: 2022-03-17 — End: 2022-03-23
  Administered 2022-03-17 – 2022-03-20 (×3): 8.6 mg via ORAL
  Filled 2022-03-17 (×5): qty 1

## 2022-03-17 NOTE — Progress Notes (Addendum)
Advanced Heart Failure Rounding Note   Subjective:    Extubated yesterday. Stable on 2L [place].   Back in Afib w/ RVR this morning, V-rates 110s-120s. BP stable on NE 1. On amio gtt at 60/hr    On milrinone 0.5  NE 1  Co-ox 64%  On Lasix gtt at 15/hr. 5.9L I UOP yesterday. Wt down another 10 lb. CVP 14-15   SCr 2.45 -> 2.07 -> 2.56 - >2.65 ->2.58  K 3.3 Mg pending.   Feels well this morning. Denies dyspnea. No awareness of afib.     Objective:   Weight Range:  Vital Signs:   Temp:  [98.4 F (36.9 C)] 98.4 F (36.9 C) (09/05 0743) Pulse Rate:  [92-126] 105 (09/05 0600) Resp:  [14-33] 23 (09/05 0600) BP: (115)/(84) 115/84 (09/05 0600) SpO2:  [92 %-99 %] 96 % (09/05 0600) Arterial Line BP: (123-196)/(75-120) 142/80 (09/05 0500) Weight:  [78.8 kg] 78.8 kg (09/05 0500) Last BM Date :  (PTA)  Weight change: Filed Weights   03/15/22 0500 03/16/22 0500 03/17/22 0500  Weight: 89.8 kg 83.4 kg 78.8 kg    Intake/Output:   Intake/Output Summary (Last 24 hours) at 03/17/2022 0819 Last data filed at 03/17/2022 0800 Gross per 24 hour  Intake 1728.06 ml  Output 6525 ml  Net -4796.94 ml    PHYSICAL EXAM: CVP 14 General:  sitting up in bed. Fatigued looking. No distress  HEENT: normal   Neck: supple. JVD 14 cm. Carotids 2+ bilat; no bruits. No lymphadenopathy or thyromegaly appreciated. Cor: PMI nondisplaced. Irregularly irregular rhythm and rate. 2/6 SEM. + S3 + sternotomy site ok  Lungs: decreased BS at the bases bilaterally  Abdomen: soft, nontender, nondistended. No hepatosplenomegaly. No bruits or masses. Good bowel sounds. Extremities: no cyanosis, clubbing, rash, trace b/l LE  + unna boots  Neuro: alert & oriented x 3, cranial nerves grossly intact. moves all 4 extremities w/o difficulty. Affect pleasant. GU: + foley   Telemetry: Afib 120s  Personally reviewed   Labs: Basic Metabolic Panel: Recent Labs  Lab 03/13/22 1007 03/13/22 1410 03/14/22 0251  03/14/22 1007 03/14/22 1642 03/14/22 1956 03/15/22 0500 03/15/22 1659 03/16/22 0447 03/17/22 0530  NA 133*   < > 134*  --   --  135 137  --  133* 131*  K 3.6   < > 3.6  --   --  4.9 4.6  --  3.7 3.3*  CL 97*   < > 99  --   --  101 97*  --  94* 85*  CO2 18*   < > 22  --   --  20* 24  --  24 29  GLUCOSE 225*   < > 126*  --   --  120* 126*  --  109* 136*  BUN 29*   < > 29*  --   --  38* 39*  --  41* 36*  CREATININE 2.21*   < > 2.07*  --   --  2.66* 2.56*  --  2.65* 2.58*  CALCIUM 8.6*   < > 8.5*  --   --  9.0 9.1  --  8.9 9.0  MG 2.3  --   --  1.9 2.1  --  1.9 2.3  --   --   PHOS  --   --   --  3.8 5.2*  --  4.9* 4.7*  --   --    < > = values in this interval not  displayed.    Liver Function Tests: Recent Labs  Lab 03/13/22 1009 03/13/22 1410  AST 31 43*  ALT 25 28  ALKPHOS 112 98  BILITOT 1.4* 1.4*  PROT 7.0 6.3*  ALBUMIN 3.1* 2.7*   No results for input(s): "LIPASE", "AMYLASE" in the last 168 hours. No results for input(s): "AMMONIA" in the last 168 hours.  CBC: Recent Labs  Lab 03/13/22 1007 03/13/22 1452 03/13/22 1806 03/14/22 0251 03/15/22 0500 03/16/22 0447 03/17/22 0530  WBC 9.2  --   --  9.1 8.9 8.7 8.2  HGB 10.5*   < > 11.2* 9.7* 9.9* 9.2* 10.0*  HCT 34.4*   < > 33.0* 28.8* 31.0* 27.8* 29.9*  MCV 90.3  --   --  83.5 86.4 84.0 83.3  PLT 303  --   --  288 302 261 242   < > = values in this interval not displayed.    Cardiac Enzymes: No results for input(s): "CKTOTAL", "CKMB", "CKMBINDEX", "TROPONINI" in the last 168 hours.  BNP: BNP (last 3 results) Recent Labs    02/19/22 1002 03/13/22 1007 03/13/22 1410  BNP 3,100.6* 2,469.0* 3,264.6*    ProBNP (last 3 results) No results for input(s): "PROBNP" in the last 8760 hours.    Other results:  Imaging: DG CHEST PORT 1 VIEW  Result Date: 03/16/2022 CLINICAL DATA:  Congestive heart failure. Endotracheal tube present. EXAM: PORTABLE CHEST 1 VIEW COMPARISON:  03/15/2022 FINDINGS: Endotracheal  tube and nasogastric tube remain in appropriate position. Marked cardiomegaly is stable. Decreased bibasilar atelectasis is seen. Probable small layering bilateral pleural effusions noted. IMPRESSION: Decreased bibasilar atelectasis. Stable cardiomegaly and probable small layering bilateral pleural effusions. Electronically Signed   By: Marlaine Hind M.D.   On: 03/16/2022 09:10     Medications:     Scheduled Medications:  aspirin  81 mg Per Tube Daily   Chlorhexidine Gluconate Cloth  6 each Topical Daily   insulin aspart  0-15 Units Subcutaneous Q4H   potassium chloride  40 mEq Oral Once   sodium chloride flush  3 mL Intravenous Q12H    Infusions:  sodium chloride     sodium chloride Stopped (03/15/22 1704)   amiodarone 60 mg/hr (03/17/22 0025)   feeding supplement (VITAL 1.5 CAL) Stopped (03/14/22 2358)   furosemide (LASIX) 200 mg in dextrose 5 % 100 mL (2 mg/mL) infusion 15 mg/hr (03/17/22 0601)   heparin 2,100 Units/hr (03/17/22 0200)   magnesium sulfate bolus IVPB     milrinone 0.5 mcg/kg/min (03/17/22 0559)   norepinephrine (LEVOPHED) Adult infusion 2 mcg/min (03/16/22 2356)    PRN Medications: sodium chloride, Place/Maintain arterial line **AND** sodium chloride, acetaminophen, ondansetron (ZOFRAN) IV, mouth rinse, sodium chloride flush   Assessment/Plan:   1. A/C Systolic Biventricular HF-->Cardiogenic Shock - Repeat ECHO - LVEF 5-10%  RV severely reduced. IVC dilated. AVR ok - On home milrinone 0.5 mcg + NE 1. Co-ox 64% - Volume improving w/ diuresis, CVP 14-15 - Continue Lasix gtt at 15/hr and supp K  - GDMT limited by renal fx and hypotension requiring NE  - will try to wean NE today  - Will need OHTx +/- kidney in near future   2. Acute Hypoxic Respiratory Failure - Extubated 9/4 - stable on 2L [place]   3. A fib RVR -Had DC-CV 02/26/22--> SR -Emergent DC-CV 03/13/22 ->SR -Repeat DCCV 9/3 -NSR  -Back in Afib w/ RVR today  -Rebolus amio 150 x 1. Continue gtt at  60/hr (continue until off inotropes) -Continue  heparin -Supp K and check Mg    4. AkI on CKD Stage IIIb - SCr 2.2 -> 2.4 -> 2.1 -> 2.5 ->2.7->2.58  - due to ATN/shock - continue hemodynamic support - daily BMET   5. S/P AVR  -H/O AV repair 2018 with complex valvuloplasty including plication of left coronary leaflet and aortic ring annuloplasty -AVR [facility] 02/2022  -Repeat Echo - valve ok.    Length of Stay: [place] PA-C  03/17/2022, 8:19 AM  Advanced Heart Failure Team Pager [phone] (M-F; 7a - 4p)  Please contact [facility] for night-coverage after hours (4p -7a ) and weekends on [email or web address]   Agree with above.   Now in AFL in 110-120 range. Co-ox ok on NE 3 -> now weaned off. Excellent diuresis - weight down about 25 pounds total. CVP down to 10.   Feels ok. Nauseated at times  General:  Weak appearing. No resp difficulty HEENT: normal Neck: supple. JVP 10 Carotids 2+ bilat; no bruits. No lymphadenopathy or thryomegaly appreciated. Cor: PMI nondisplaced. Irregular tachy Lungs: clear Abdomen: soft, nontender, nondistended. No hepatosplenomegaly. No bruits or masses. Good bowel sounds. Extremities: no cyanosis, clubbing, rash, edema Neuro: alert & orientedx3, cranial nerves grossly intact. moves all 4 extremities w/o difficulty. Affect pleasant   Remains tenuous. Needs to get back in NSR. Will continue IV amio. Plan repeat DC-CV tomorrow. NE now weaned off. Follow co-ox & CVP. Drop lasix gtt to 10. OOB to chair.   D/w [facility] transplant team. Will eventually ned heart-kidney tx.   CRITICAL CARE Performed by: Glori Bickers  Total critical care time: 45 minutes  Critical care time was exclusive of separately billable procedures and treating other patients.  Critical care was necessary to treat or prevent imminent or life-threatening deterioration.  Critical care was time spent personally by me (independent of midlevel providers or residents) on the  following activities: development of treatment plan with patient and/or surrogate as well as nursing, discussions with consultants, evaluation of patient's response to treatment, examination of patient, obtaining history from patient or surrogate, ordering and performing treatments and interventions, ordering and review of laboratory studies, ordering and review of radiographic studies, pulse oximetry and re-evaluation of patient's condition.  Glori Bickers, MD  10:17 AM

## 2022-03-17 NOTE — Progress Notes (Addendum)
ANTICOAGULATION CONSULT NOTE - Follow Up Consult  Pharmacy Consult for heparin Indication: atrial fibrillation  No Known Allergies  Patient Measurements: Height: '5\' 10"'$  (177.8 cm) Weight: 78.8 kg (173 lb 11.6 oz) IBW/kg (Calculated) : 73 Heparin Dosing Weight: 92.5kg  Vital Signs: Temp: 99 F (37.2 C) (09/05 1143) Temp Source: Oral (09/05 1143) BP: 104/83 (09/05 1200) Pulse Rate: 119 (09/05 1200)  Labs: Recent Labs    03/15/22 0500 03/15/22 0528 03/15/22 1659 03/16/22 0447 03/17/22 0530 03/17/22 1346  HGB 9.9*  --   --  9.2* 10.0*  --   HCT 31.0*  --   --  27.8* 29.9*  --   PLT 302  --   --  261 242  --   APTT 89*  --  89* 57*  --   --   HEPARINUNFRC  --    < >  --  0.59 0.71* 0.57  CREATININE 2.56*  --   --  2.65* 2.58*  --    < > = values in this interval not displayed.     Estimated Creatinine Clearance: 39.3 mL/min (A) (by C-G formula based on SCr of 2.58 mg/dL (H)).   Assessment: 41 yo M w/ PMH afib who was prescribed apixaban at time of last discharge (but received therapy during his inpatient stay). Patient was unable to pick up outpatient Rx and therefore last dose was during his admission 8/29 (>48h).    Heparin level is supratherapeutic at 0.71 units/mL. CBC is stable and no bleeding noted.  Goal of Therapy:  Heparin level 0.3-0.7 units/ml Monitor platelets by anticoagulation protocol: Yes   Plan:  Decrease heparin infusion to 2000 units/hr Heparin level in 6-8 hours Daily Heparin level, PTT and CBC   Thank you for allowing pharmacy to participate in this patient's care.  Reatha Harps, PharmD PGY2 Pharmacy Resident 03/17/2022 3:31 PM Check [email or web address] for unit specific pharmacy number  ADDENDUM:  Heparin level after rate change is 0.57.  Plan: Continue heparin at 2000 units/hr Confirmatory heparin level in AM  Thank you for allowing pharmacy to participate in this patient's care.  Reatha Harps, PharmD PGY2 Pharmacy Resident 03/17/2022 3:33  PM Check [email or web address] for unit specific pharmacy number

## 2022-03-17 NOTE — Progress Notes (Addendum)
NAME:  [name] [name], MRN:  [id number], DOB:  1980/07/26, LOS: 4 ADMISSION DATE:  03/13/2022, CONSULTATION DATE:  9/1 REFERRING MD:  Marigene Ehlers, CHIEF COMPLAINT:  dyspnea   History of Present Illness:  41 y/o male with severe NICM who just underwent high risk AVR with bioprosthetic valve at [facility] on 8/13,  d/c'd home on 8/29 admitted to [facility] on 9/1 from [facility] with respiratory failure in setting of volume overload.   Required intubation, mechanical ventilation. AFwRVR requiring cardioversion.   Pertinent  Medical History  Systolic congestive heart failure, CKD stage II, hypertension, hyperlipidemia, status post aortic valve repair, mitral regurgitation, and anxiety  Significant Hospital Events: Including procedures, antibiotic start and stop dates in addition to other pertinent events   9/1 for shortness of breath and palpitations found in decompensated heart failure requiring intubation greater than protection, cardioversion attempt by heart failure; left double lumen PICC prior to admission 9/3 cardioversion, continued lasix infusion 9/4 Diuresed 6L on lasix infusion, wants ETT out. / Extubated.  Interim History / Subjective:  Afebrile  Remains on levophed, milrinone, heparin, amio, lasix infusions  Denies acute complaints  I/O 5.9L UOP, -3.5L in last 24 hours (-11L overall)  Objective   Blood pressure (!) 124/107, pulse (!) 113, temperature 98.4 F (36.9 C), temperature source Oral, resp. rate 13, height '5\' 10"'$  (1.778 m), weight 78.8 kg, SpO2 98 %. CVP:  [3 mmHg-16 mmHg] 14 mmHg      Intake/Output Summary (Last 24 hours) at 03/17/2022 1019 Last data filed at 03/17/2022 1000 Gross per 24 hour  Intake 2579.44 ml  Output 5850 ml  Net -3270.56 ml   Filed Weights   03/15/22 0500 03/16/22 0500 03/17/22 0500  Weight: 89.8 kg 83.4 kg 78.8 kg    Examination: General: adult male lying in bed in NAD HEENT: MM pink/moist, [place] O2, anicteric  Neuro: AAOx4, speech clear, MAE CV: s1s2 tachy,  irregular, no m/r/g PULM:  non-labored at rest, lungs bilaterally clear  GI: soft, bsx4 active  Extremities: warm/dry, BLE's wrapped with compression dressings   Skin: no rashes or lesions  SvO2 64.2, Cr 2.58, K+3.3 (replaced)  Resolved Hospital Problem list     Assessment & Plan:   Cardiogenic Shock in setting of Biventricular Heart Failure S/p Bioprosthetic Aortic Valve Replacement on 8/13 at [facility] Mitral Regurgitation Atrial Fibrillation with RVR -amiodarone, heparin, milrinone, lasix, levophed per CHF Service  -Co-ox noted, reviewed weaning levophed with RN -ASA  -MAP Goal >70  -fluid restriction of 1500 ml initially, follow strict I/O's -trend CVP   Acute Hypoxemic Respiratory Failure due to Acute Pulmonary Edema > improved Extubated 9/4.  -volume removal with CRRT -wean O2 for sats 90-95% -advance diet as tolerated, begin clear liquids   Stage IV CKD  Hypokalemia -Trend BMP / urinary output -Replace electrolytes as indicated, K+/Mg+ replaced 9/5 -Avoid nephrotoxic agents, ensure adequate renal perfusion  Hyperglycemia -SSI, moderate scale  -glucose range 111-205   Best Practice (right click and "Reselect all SmartList Selections" daily)  Diet/type: clear liquids DVT prophylaxis: systemic heparin GI prophylaxis: PPI Lines: Central line and yes and it is still needed (PICC from 7/31) Foley:  Yes, and it is still needed Code Status:  full code Last date of multidisciplinary goals of care discussion: per primary  Patient updated on plan of care 9/5.    Critical care time: 31 minutes    Noe Gens, MSN, APRN, NP-C, AGACNP-BC [facility] 03/17/2022, 10:19 AM   Please see [email or web address]  for pager details.   From 7A-7P if no response, please call [phone] After hours, please call ELink [phone]

## 2022-03-17 NOTE — Progress Notes (Signed)
ANTICOAGULATION CONSULT NOTE - Follow Up Consult  Pharmacy Consult for heparin Indication: atrial fibrillation  No Known Allergies  Patient Measurements: Height: '5\' 10"'$  (177.8 cm) Weight: 83.4 kg (183 lb 13.8 oz) IBW/kg (Calculated) : 73 Heparin Dosing Weight: 92.5kg  Vital Signs: Temp Source: Oral (09/04 2000) Pulse Rate: 92 (09/05 0400)  Labs: Recent Labs    03/14/22 1956 03/14/22 1956 03/15/22 0500 03/15/22 0528 03/15/22 1659 03/16/22 0447 03/17/22 0530  HGB  --    < > 9.9*  --   --  9.2* 10.0*  HCT  --   --  31.0*  --   --  27.8* 29.9*  PLT  --   --  302  --   --  261 242  APTT 56*  --  89*  --  89* 57*  --   HEPARINUNFRC  --   --   --  0.94*  --  0.59  --   CREATININE 2.66*  --  2.56*  --   --  2.65*  --    < > = values in this interval not displayed.     Estimated Creatinine Clearance: 38.3 mL/min (A) (by C-G formula based on SCr of 2.65 mg/dL (H)).   Assessment: 41 yo M w/ PMH afib who was prescribed apixaban at time of last discharge (but received therapy during his inpatient stay). Patient was unable to pick up outpatient Rx and therefore last dose was during his admission 8/29 (>48h).    Heparin level is supratherapeutic at 0.71 units/mL. CBC is stable and no bleeding noted.  Goal of Therapy:  Heparin level 0.3-0.7 units/ml Monitor platelets by anticoagulation protocol: Yes   Plan:  Decrease heparin infusion to 2000 units/hr Heparin level in 6-8 hours Daily Heparin level, PTT and CBC   Thank you for allowing pharmacy to participate in this patient's care.  Reatha Harps, PharmD PGY2 Pharmacy Resident 03/17/2022 6:03 AM Check [email or web address] for unit specific pharmacy number

## 2022-03-18 ENCOUNTER — Inpatient Hospital Stay (HOSPITAL_COMMUNITY): Payer: BC Managed Care – PPO

## 2022-03-18 DIAGNOSIS — J9601 Acute respiratory failure with hypoxia: Secondary | ICD-10-CM | POA: Diagnosis not present

## 2022-03-18 DIAGNOSIS — I5023 Acute on chronic systolic (congestive) heart failure: Secondary | ICD-10-CM | POA: Diagnosis not present

## 2022-03-18 DIAGNOSIS — I509 Heart failure, unspecified: Secondary | ICD-10-CM | POA: Diagnosis not present

## 2022-03-18 DIAGNOSIS — R739 Hyperglycemia, unspecified: Secondary | ICD-10-CM

## 2022-03-18 DIAGNOSIS — J810 Acute pulmonary edema: Secondary | ICD-10-CM

## 2022-03-18 DIAGNOSIS — I480 Paroxysmal atrial fibrillation: Secondary | ICD-10-CM | POA: Diagnosis not present

## 2022-03-18 DIAGNOSIS — I5043 Acute on chronic combined systolic (congestive) and diastolic (congestive) heart failure: Secondary | ICD-10-CM

## 2022-03-18 DIAGNOSIS — N179 Acute kidney failure, unspecified: Secondary | ICD-10-CM | POA: Diagnosis not present

## 2022-03-18 DIAGNOSIS — R570 Cardiogenic shock: Secondary | ICD-10-CM | POA: Diagnosis not present

## 2022-03-18 DIAGNOSIS — I4892 Unspecified atrial flutter: Secondary | ICD-10-CM

## 2022-03-18 LAB — BASIC METABOLIC PANEL
Anion gap: 18 — ABNORMAL HIGH (ref 5–15)
BUN: 32 mg/dL — ABNORMAL HIGH (ref 6–20)
CO2: 26 mmol/L (ref 22–32)
Calcium: 9.1 mg/dL (ref 8.9–10.3)
Chloride: 87 mmol/L — ABNORMAL LOW (ref 98–111)
Creatinine, Ser: 2.71 mg/dL — ABNORMAL HIGH (ref 0.61–1.24)
GFR, Estimated: 29 mL/min — ABNORMAL LOW (ref >60)
Glucose, Bld: 115 mg/dL — ABNORMAL HIGH (ref 70–99)
Potassium: 3.5 mmol/L (ref 3.5–5.1)
Sodium: 131 mmol/L — ABNORMAL LOW (ref 135–145)

## 2022-03-18 LAB — GLUCOSE, CAPILLARY
Glucose-Capillary: 150 mg/dL — ABNORMAL HIGH (ref 70–99)
Glucose-Capillary: 143 mg/dL — ABNORMAL HIGH (ref 70–99)
Glucose-Capillary: 138 mg/dL — ABNORMAL HIGH (ref 70–99)
Glucose-Capillary: 222 mg/dL — ABNORMAL HIGH (ref 70–99)
Glucose-Capillary: 174 mg/dL — ABNORMAL HIGH (ref 70–99)
Glucose-Capillary: 157 mg/dL — ABNORMAL HIGH (ref 70–99)
Glucose-Capillary: 144 mg/dL — ABNORMAL HIGH (ref 70–99)

## 2022-03-18 LAB — CBC
HCT: 29.9 % — ABNORMAL LOW (ref 39.0–52.0)
Hemoglobin: 10.1 g/dL — ABNORMAL LOW (ref 13.0–17.0)
MCH: 27.8 pg (ref 26.0–34.0)
MCHC: 33.8 g/dL (ref 30.0–36.0)
MCV: 82.4 fL (ref 80.0–100.0)
Platelets: 240 10*3/uL (ref 150–400)
RBC: 3.63 MIL/uL — ABNORMAL LOW (ref 4.22–5.81)
RDW: 19.8 % — ABNORMAL HIGH (ref 11.5–15.5)
WBC: 7.6 10*3/uL (ref 4.0–10.5)
nRBC: 0 % (ref 0.0–0.2)

## 2022-03-18 LAB — PROCALCITONIN: Procalcitonin: 1.35 ng/mL

## 2022-03-18 LAB — COOXEMETRY PANEL
Carboxyhemoglobin: 2.1 % — ABNORMAL HIGH (ref 0.5–1.5)
Methemoglobin: <0.7 % (ref 0.0–1.5)
O2 Saturation: 73.8 %
Total hemoglobin: 10.3 g/dL — ABNORMAL LOW (ref 12.0–16.0)

## 2022-03-18 LAB — HEPARIN LEVEL (UNFRACTIONATED): Heparin Unfractionated: 0.53 IU/mL (ref 0.30–0.70)

## 2022-03-18 LAB — MAGNESIUM: Magnesium: 1.9 mg/dL (ref 1.7–2.4)

## 2022-03-18 MED ORDER — FENTANYL CITRATE PF 50 MCG/ML IJ SOSY
100.0000 ug | PREFILLED_SYRINGE | Freq: Once | INTRAMUSCULAR | Status: AC
  Administered 2022-03-18: 100 ug via INTRAVENOUS
  Filled 2022-03-18: qty 2

## 2022-03-18 MED ORDER — ETOMIDATE 2 MG/ML IV SOLN
INTRAVENOUS | Status: AC
  Administered 2022-03-18: 5 mg
  Filled 2022-03-18: qty 10

## 2022-03-18 MED ORDER — SODIUM CHLORIDE 0.9 % IV SOLN: INTRAVENOUS | Status: DC

## 2022-03-18 MED ORDER — MIDAZOLAM HCL 2 MG/2ML IJ SOLN
2.0000 mg | Freq: Once | INTRAMUSCULAR | Status: AC
  Administered 2022-03-18: 2 mg via INTRAVENOUS
  Filled 2022-03-18: qty 2

## 2022-03-18 MED ORDER — MIDAZOLAM HCL 2 MG/2ML IJ SOLN
2.0000 mg | Freq: Once | INTRAMUSCULAR | Status: AC
Start: 2022-03-18 — End: 2022-03-18
  Administered 2022-03-18: 2 mg via INTRAVENOUS
  Filled 2022-03-18: qty 2

## 2022-03-18 MED ORDER — MAGNESIUM SULFATE 2 GM/50ML IV SOLN: 2.0000 g | Freq: Once | INTRAVENOUS | Status: DC

## 2022-03-18 MED ORDER — POTASSIUM CHLORIDE 10 MEQ/50ML IV SOLN
10.0000 meq | INTRAVENOUS | Status: AC
  Administered 2022-03-18 (×4): 10 meq via INTRAVENOUS
  Filled 2022-03-18 (×4): qty 50

## 2022-03-18 MED ORDER — MAGNESIUM SULFATE 2 GM/50ML IV SOLN
2.0000 g | Freq: Once | INTRAVENOUS | Status: AC
  Administered 2022-03-18: 2 g via INTRAVENOUS
  Filled 2022-03-18: qty 50

## 2022-03-18 MED ORDER — FENTANYL CITRATE PF 50 MCG/ML IJ SOSY
100.0000 ug | PREFILLED_SYRINGE | Freq: Once | INTRAMUSCULAR | Status: AC
  Administered 2022-03-18: 50 ug via INTRAVENOUS
  Filled 2022-03-18: qty 2

## 2022-03-18 MED ORDER — SODIUM CHLORIDE 0.9 % IV SOLN
2.0000 g | INTRAVENOUS | Status: DC
Start: 2022-03-18 — End: 2022-03-18
  Administered 2022-03-18: 2 g via INTRAVENOUS
  Filled 2022-03-18: qty 20

## 2022-03-18 MED ORDER — SODIUM CHLORIDE 0.9 % IV SOLN
2.0000 g | Freq: Two times a day (BID) | INTRAVENOUS | Status: DC
  Administered 2022-03-18 – 2022-03-23 (×11): 2 g via INTRAVENOUS
  Filled 2022-03-18 (×11): qty 12.5

## 2022-03-18 NOTE — CV Procedure (Signed)
    DIRECT CURRENT CARDIOVERSION  NAME:  [name] [name]   MRN: [id number] DOB:  Dec 05, 1980   ADMIT DATE: 03/13/2022   INDICATIONS: Atrial fibrillation    PROCEDURE:   Informed consent was obtained prior to the procedure. The risks, benefits and alternatives for the procedure were discussed and the patient comprehended these risks. Once an appropriate time out was taken, the patient had the defibrillator pads placed in the anterior and posterior position. The patient then underwent sedation by the CCM service. Once an appropriate level of sedation was achieved, the patient received a single biphasic, synchronized 200J shock with prompt conversion to sinus rhythm. No apparent complications.  Glori Bickers, MD  2:46 PM

## 2022-03-18 NOTE — Procedures (Signed)
Sedation documentation: On 4L [place] Fentanyl 13mg in divided doses Versed '4mg'$  in divided doses Etomidate '5mg'$   Cardioverted at 200J with successful return to sinus rhythm.  Post sedation vital signs  BP (!) 122/97   Pulse (!) 114   Temp (P) 98.1 F (36.7 C) (Oral)   Resp (!) 21   Ht '5\' 10"'$  (1.778 m)   Wt 77.8 kg   SpO2 94%   BMI 24.61 kg/m  Awake, answering questions appropriately. Normal speech.  Con't ETCO2 monitoring, supplemental O2 until more awake, then wean off.  PRN zofran Can resume cardiac diet when awake.  Sedation time: 15 min.  LJulian Hy DO 03/18/22 11:09 AM [facility]

## 2022-03-18 NOTE — Progress Notes (Addendum)
Advanced Heart Failure Rounding Note   Subjective:    9/4 extubated. Stable on RA  9/5 went back into Afib/AFL w/ RVR, rebolused w/ amio. NE discontinued. Lasix gtt reduced to 10/hr    This morning, on Milrinone 0.5. Co-ox 74%.   On lasix gtt at 10/hr. 3.3L in [place] yesterday. Wt down 2 lb. CVP 11-12   Scr trending back up 2.58>>2.71.   BPs soft off NE, 67T systolic.   Remains in Afib, rates low 100s-110s.    K 3.5 Mg 1.9   + Productive cough today. CXR w/ interval development of trace rt pleural effusion and RLL airspace disease concerning for PNA. PCT elevated 1.35.   NPO. Denies dyspnea. No CP.    Objective:   Weight Range:  Vital Signs:   Temp:  [98.3 F (36.8 C)-99 F (37.2 C)] 98.5 F (36.9 C) (09/06 0353) Pulse Rate:  [103-128] 114 (09/06 0700) Resp:  [13-30] 21 (09/06 0700) BP: (91-127)/(67-107) 122/97 (09/06 0700) SpO2:  [90 %-99 %] 94 % (09/06 0700) Weight:  [77.8 kg] 77.8 kg (09/06 0500) Last BM Date : 03/13/22  Weight change: Filed Weights   03/16/22 0500 03/17/22 0500 03/18/22 0500  Weight: 83.4 kg 78.8 kg 77.8 kg    Intake/Output:   Intake/Output Summary (Last 24 hours) at 03/18/2022 0752 Last data filed at 03/18/2022 0600 Gross per 24 hour  Intake 1136.13 ml  Output 3275 ml  Net -2138.87 ml    PHYSICAL EXAM: CVP 11-12  General: fatigued appearing. No respiratory difficulty HEENT: normal Neck: supple. JVD 12. Carotids 2+ bilat; no bruits. No lymphadenopathy or thyromegaly appreciated. Cor: PMI nondisplaced. Irregularly  irregular rhythm and tachy rate, Sternotomy site ok.  No rubs, gallops or murmurs. Lungs: clear Abdomen: soft, nontender, nondistended. No hepatosplenomegaly. No bruits or masses. Good bowel sounds. Extremities: no cyanosis, clubbing, rash, edema, LUE PICC  Neuro: alert & oriented x 3, cranial nerves grossly intact. moves all 4 extremities w/o difficulty. Affect pleasant.    Telemetry: Afib 110s  Personally  reviewed   Labs: Basic Metabolic Panel: Recent Labs  Lab 03/14/22 1007 03/14/22 1642 03/14/22 1956 03/15/22 0500 03/15/22 1659 03/16/22 0447 03/17/22 0530 03/18/22 0408  NA  --   --  135 137  --  133* 131* 131*  K  --   --  4.9 4.6  --  3.7 3.3* 3.5  CL  --   --  101 97*  --  94* 85* 87*  CO2  --   --  20* 24  --  '24 29 26  '$ GLUCOSE  --   --  120* 126*  --  109* 136* 115*  BUN  --   --  38* 39*  --  41* 36* 32*  CREATININE  --   --  2.66* 2.56*  --  2.65* 2.58* 2.71*  CALCIUM  --   --  9.0 9.1  --  8.9 9.0 9.1  MG 1.9 2.1  --  1.9 2.3  --  2.0 1.9  PHOS 3.8 5.2*  --  4.9* 4.7*  --   --   --     Liver Function Tests: Recent Labs  Lab 03/13/22 1009 03/13/22 1410  AST 31 43*  ALT 25 28  ALKPHOS 112 98  BILITOT 1.4* 1.4*  PROT 7.0 6.3*  ALBUMIN 3.1* 2.7*   No results for input(s): "LIPASE", "AMYLASE" in the last 168 hours. No results for input(s): "AMMONIA" in the last 168 hours.  CBC:  Recent Labs  Lab 03/14/22 0251 03/15/22 0500 03/16/22 0447 03/17/22 0530 03/18/22 0408  WBC 9.1 8.9 8.7 8.2 7.6  HGB 9.7* 9.9* 9.2* 10.0* 10.1*  HCT 28.8* 31.0* 27.8* 29.9* 29.9*  MCV 83.5 86.4 84.0 83.3 82.4  PLT 288 302 261 242 240    Cardiac Enzymes: No results for input(s): "CKTOTAL", "CKMB", "CKMBINDEX", "TROPONINI" in the last 168 hours.  BNP: BNP (last 3 results) Recent Labs    02/19/22 1002 03/13/22 1007 03/13/22 1410  BNP 3,100.6* 2,469.0* 3,264.6*    ProBNP (last 3 results) No results for input(s): "PROBNP" in the last 8760 hours.    Other results:  Imaging: No results found.   Medications:     Scheduled Medications:  aspirin  81 mg Oral Daily   Chlorhexidine Gluconate Cloth  6 each Topical Daily   insulin aspart  0-15 Units Subcutaneous Q4H   polyethylene glycol  17 g Oral Daily   senna  1 tablet Oral Daily   sodium chloride flush  3 mL Intravenous Q12H    Infusions:  sodium chloride     sodium chloride Stopped (03/15/22 1704)    sodium chloride     amiodarone 60 mg/hr (03/18/22 0137)   furosemide (LASIX) 200 mg in dextrose 5 % 100 mL (2 mg/mL) infusion 10 mg/hr (03/18/22 0115)   heparin 2,000 Units/hr (03/18/22 0356)   milrinone 0.5 mcg/kg/min (03/18/22 0137)   norepinephrine (LEVOPHED) Adult infusion Stopped (03/17/22 0949)   potassium chloride      PRN Medications: sodium chloride, Place/Maintain arterial line **AND** sodium chloride, acetaminophen, ondansetron (ZOFRAN) IV, mouth rinse, sodium chloride flush   Assessment/Plan:   1. A/C Systolic Biventricular HF-->Cardiogenic Shock - Repeat ECHO - LVEF 5-10%  RV severely reduced. IVC dilated. AVR ok - On home milrinone 0.5 mcg. Off NE. Co-ox 74%  - Volume improving w/ diuresis, CVP 11-12 today  - Reduce Lasix gtt down to 6/hr  - GDMT limited by renal fx and and low BP  - Will need OHTx +/- kidney in near future   2. Acute Hypoxic Respiratory Failure - Extubated 9/4 - stable on RA   3. A fib RVR -Had DC-CV 02/26/22--> SR -Emergent DC-CV 03/13/22 ->SR -Repeat DCCV 9/3>>NSR  -Back in Afib/AFL w/ RVR today  -Plan repeat beside DCCV today  -Continue gtt at 60/hr (continue until off inotropes) -Continue heparin -Supp lytes, keep K > 4.0 and Mg > 2.0. d/w pharmacy    4. AkI on CKD Stage IIIb - SCr 2.2 -> 2.4 -> 2.1 -> 2.5 ->2.7->2.58->2.71  - due to ATN/shock - continue hemodynamic support - daily BMET   5. S/P AVR  -H/O AV repair 2018 with complex valvuloplasty including plication of left coronary leaflet and aortic ring annuloplasty -AVR [facility] 02/2022  -Repeat Echo - valve ok.   6. ? PNA vs Atelectasis  - + productive cough, CXR suggestive of possible RLL PNA vs Axt - PCT 1.35 but also in the setting of renal dysfunction  - will discuss abx coverage w/ PCCM   - encouraged use of IS    Length of Stay: 5   Brittainy Simmons PA-C  03/18/2022, 7:52 AM  Advanced Heart Failure Team Pager [phone] (M-F; 7a - 4p)  Please contact [facility] for  night-coverage after hours (4p -7a ) and weekends on [email or web address]   Agree with above.   Now off NE. Remains in AFL with RVR despite IV amio. On lasix gtt at 10. Co-ox 74% CVP 8-9. Scr climbing  Having cough with clear sputum.  General:  Weak appearing.+ cough HEENT: normal Neck: supple. JVP 8-9. Carotids 2+ bilat; no bruits. No lymphadenopathy or thryomegaly appreciated. Cor: PMI nondisplaced. Irregular tachy No rubs, gallops or murmurs. + staples Lungs: clear Abdomen: soft, nontender, nondistended. No hepatosplenomegaly. No bruits or masses. Good bowel sounds. Extremities: no cyanosis, clubbing, rash, edema Neuro: alert & orientedx3, cranial nerves grossly intact. moves all 4 extremities w/o difficulty. Affect pleasant  Remains tenuous Will plan for repeat DC-CV today. CVP 8-9 -> stop lasix. Has mild RLL opacity on CXR- ? Atx vs early infiltrate. PCT 1.24. Will start abx. Encouraged IS. Continue to follow CVP and co-ox.   Updated [facility] transplant team. Will remove staples today.   CRITICAL CARE Performed by: Glori Bickers  Total critical care time: 35 minutes  Critical care time was exclusive of separately billable procedures and treating other patients.  Critical care was necessary to treat or prevent imminent or life-threatening deterioration.  Critical care was time spent personally by me (independent of midlevel providers or residents) on the following activities: development of treatment plan with patient and/or surrogate as well as nursing, discussions with consultants, evaluation of patient's response to treatment, examination of patient, obtaining history from patient or surrogate, ordering and performing treatments and interventions, ordering and review of laboratory studies, ordering and review of radiographic studies, pulse oximetry and re-evaluation of patient's condition.  Glori Bickers, MD  2:50 PM

## 2022-03-18 NOTE — Progress Notes (Signed)
ANTICOAGULATION CONSULT NOTE - Follow Up Consult  Pharmacy Consult for heparin Indication: atrial fibrillation  No Known Allergies  Patient Measurements: Height: '5\' 10"'$  (177.8 cm) Weight: 77.8 kg (171 lb 8.3 oz) IBW/kg (Calculated) : 73 Heparin Dosing Weight: 92.5kg  Vital Signs: Temp: 98.5 F (36.9 C) (09/06 0353) Temp Source: Oral (09/06 0353) BP: 114/90 (09/06 0400) Pulse Rate: 106 (09/06 0400)  Labs: Recent Labs    03/15/22 1659 03/16/22 0447 03/16/22 0447 03/17/22 0530 03/17/22 1346 03/18/22 0408  HGB  --  9.2*   < > 10.0*  --  10.1*  HCT  --  27.8*  --  29.9*  --  29.9*  PLT  --  261  --  242  --  240  APTT 89* 57*  --   --   --   --   HEPARINUNFRC  --  0.59   < > 0.71* 0.57 0.53  CREATININE  --  2.65*  --  2.58*  --  2.71*   < > = values in this interval not displayed.     Estimated Creatinine Clearance: 37.4 mL/min (A) (by C-G formula based on SCr of 2.71 mg/dL (H)).   Assessment: 41 yo M w/ PMH afib who was prescribed apixaban at time of last discharge (but received therapy during his inpatient stay). Patient was unable to pick up outpatient Rx and therefore last dose was during his admission 8/29 (>48h).    Heparin level is therapeutic x 2 at 0.53 units/mL this AM. CBC is stable and no bleeding noted.  Goal of Therapy:  Heparin level 0.3-0.7 units/ml Monitor platelets by anticoagulation protocol: Yes   Plan:  Decrease heparin infusion to 2000 units/hr Heparin level in 6-8 hours Daily Heparin level, PTT and CBC   Thank you for allowing pharmacy to participate in this patient's care.  Reatha Harps, PharmD PGY2 Pharmacy Resident 03/18/2022 6:06 AM Check [email or web address] for unit specific pharmacy number

## 2022-03-18 NOTE — Progress Notes (Signed)
NAME:  [name] [name], MRN:  [id number], DOB:  December 31, 1980, LOS: 5 ADMISSION DATE:  03/13/2022, CONSULTATION DATE:  9/1 REFERRING MD:  Marigene Ehlers, CHIEF COMPLAINT:  dyspnea   History of Present Illness:  41 y/o male with severe NICM who just underwent high risk AVR with bioprosthetic valve at [facility] on 8/13,  d/c'd home on 8/29 admitted to [facility] on 9/1 from [facility] with respiratory failure in setting of volume overload.   Required intubation, mechanical ventilation. AFwRVR requiring cardioversion.   Pertinent  Medical History  Systolic congestive heart failure, CKD stage II, hypertension, hyperlipidemia, status post aortic valve repair, mitral regurgitation, and anxiety  Significant Hospital Events: Including procedures, antibiotic start and stop dates in addition to other pertinent events   9/1 for shortness of breath and palpitations found in decompensated heart failure requiring intubation greater than protection, cardioversion attempt by heart failure; left double lumen PICC prior to admission 9/3 cardioversion, continued lasix infusion 9/4 Diuresed 6L on lasix infusion, wants ETT out. / Extubated. 9/5 On levo, milrinone, heparin, amio & lasix infusions  Interim History / Subjective:  Cardioverted this am > NSR Afebrile  [place] 4L (on for cardioversion) Glucose range 115-150 I/O 3.2L UOP, -2.1L in last 24 hours (-13.3L overall) Pt reports mild sputum production with cough  Objective   Blood pressure (!) 128/103, pulse (!) 103, temperature 98.5 F (36.9 C), temperature source Oral, resp. rate (!) 27, height '5\' 10"'$  (1.778 m), weight 77.8 kg, SpO2 99 %. CVP:  [6 mmHg-55 mmHg] 55 mmHg      Intake/Output Summary (Last 24 hours) at 03/18/2022 1155 Last data filed at 03/18/2022 1100 Gross per 24 hour  Intake 2051.78 ml  Output 2850 ml  Net -798.22 ml   Filed Weights   03/16/22 0500 03/17/22 0500 03/18/22 0500  Weight: 83.4 kg 78.8 kg 77.8 kg    Examination: General: thin adult male sitting up  in bed in NAD, wife at bedside  HEENT: MM pink/moist, [place] O2, anicteric, pupils =/reactive  Neuro: AAOx4, speech clear, MAE  CV: s1s2 RRR, NSR 90's on monitor, no m/r/g, sternal wound well approximated, staples intact PULM: non-labored at rest, lungs bilaterally clear  GI: soft, bsx4 active  Extremities: warm/dry, no edema  Skin: no rashes or lesions   Resolved Hospital Problem list     Assessment & Plan:   Cardiogenic Shock in setting of Biventricular Heart Failure S/p Bioprosthetic Aortic Valve Replacement on 8/13 at [facility] Mitral Regurgitation Atrial Fibrillation with RVR -amiodarone, heparin, milrinone, lasix per CHF -Co-Ox 73.8 noted  -MAP Goal >70 -close monitoring of electrolytes  -diet ok once awake from cardioversion  -follow CVP, 7 on exam   Acute Hypoxemic Respiratory Failure due to Acute Pulmonary Edema > improved Cough, Mild Sputum Production, Atelectasis on CXR 9/6 Extubated 9/4.  -lasix infusion on hold currently, defer to cardiology  -wean O2 for sats >90% -aspiration precautions  -empiric ceftriaxone 9/6, follow up 9/7 am may be able to stop / de-escalate  -encourage pulmonary hygiene - IS, mobilize > favor atelectasis on CXR  -follow PCT  Stage IV CKD  Hypokalemia -Trend BMP / urinary output -Replace electrolytes as indicated, Mg+ 9/6 -Avoid nephrotoxic agents, ensure adequate renal perfusion -follow K+/Mg+ closely with diuresis  Hyperglycemia -SSI, moderate scale  -glucose range 115-143   Best Practice (right click and "Reselect all SmartList Selections" daily)  Diet/type: Regular consistency (see orders) DVT prophylaxis: systemic heparin GI prophylaxis: PPI Lines: Central line and yes and it is still needed (  PICC from 7/31) Foley:  Yes, and it is still needed Code Status:  full code Last date of multidisciplinary goals of care discussion: per primary  Patient & wife updated on plan of care 9/6.    Critical care time: 50 minutes    Noe Gens, MSN, APRN, NP-C, AGACNP-BC [facility] 03/18/2022, 11:55 AM   Please see [email or web address] for pager details.   From 7A-7P if no response, please call [phone] After hours, please call ELink [phone]

## 2022-03-18 NOTE — Progress Notes (Signed)
Seen and examined. Off oxygen this morning. No SOB, nausea resolved since yesterday. Lasix off. Planning for cardioversion under sedation today.   Mallampati I ASA IV  D/w cardiology Fentanyl 179mg Q599m PRN Versed '4mg'$    LaJulian HyDO 03/18/22 10:39 AM [facility]

## 2022-03-18 NOTE — Progress Notes (Signed)
Physical Therapy Treatment Patient Details Name: [name] [name] MRN: [id number] DOB: May 30, 1981 Today's Date: 03/18/2022   History of Present Illness Pt is a 41 y.o. male admitted 03/13/22 from [facility] with acute on chronic heart failure, pulmonary edema and respiratory failure; ETT 9/1-9/4. DCCV 9/3. PMH includes recent AVR at [facility] (02/21/22), biventricular heart failure, NICM, HTN, AKI, AVR 2018, hernia, DM.   PT Comments    Pt progressing with mobility. Today's session focused on ambulation for improving strength and activity tolerance; pt declines gait training without DME. Pt moving well, only requiring assist for line management today; good awareness of sternal precautions. Educ on activity recommendations, encouraged more frequent hallway ambulation with nursing. Will continue to follow acutely to address established goals.  HR 100s-120s    Recommendations for follow up therapy are one component of a multi-disciplinary discharge planning process, led by the attending physician.  Recommendations may be updated based on patient status, additional functional criteria and insurance authorization.  Follow Up Recommendations  No PT follow up     Assistance Recommended at Discharge Intermittent Supervision/Assistance  Patient can return home with the following A little help with bathing/dressing/bathroom;Assistance with cooking/housework;Assist for transportation   Equipment Recommendations  None recommended by PT    Recommendations for Other Services       Precautions / Restrictions Precautions Precautions: Sternal     Mobility  Bed Mobility Overal bed mobility: Modified Independent Bed Mobility: Supine to Sit           General bed mobility comments: able to power up to long sitting without UE support, HOB slightly elevated    Transfers Overall transfer level: Needs assistance Equipment used: Rolling walker (2 wheels) Transfers: Sit to/from Stand Sit to Stand:  Supervision           General transfer comment: supervision for safety/lines    Ambulation/Gait Ambulation/Gait assistance: Supervision Gait Distance (Feet): 700 Feet Assistive device: Rolling walker (2 wheels) Gait Pattern/deviations: Step-through pattern, Decreased stride length Gait velocity: Decreased     General Gait Details: slow, steady gait with RW per pt request; supervision for safety/lines; HR 100s-120s with activity. pt denies SOB or fatigue. pt declined gait trial without DME   Stairs             Wheelchair Mobility    Modified Rankin (Stroke Patients Only)       Balance Overall balance assessment: Needs assistance   Sitting balance-Leahy Scale: Good     Standing balance support: No upper extremity supported, During functional activity Standing balance-Leahy Scale: Fair Standing balance comment: can static stand without UE support, pt preference for RW with ambulation                            Cognition Arousal/Alertness: Awake/alert Behavior During Therapy: WFL for tasks assessed/performed Overall Cognitive Status: Within Functional Limits for tasks assessed                                          Exercises      General Comments        Pertinent Vitals/Pain Pain Assessment Pain Assessment: No/denies pain Pain Intervention(s): Monitored during session    Home Living                          Prior Function  PT Goals (current goals can now be found in the care plan section) Progress towards PT goals: Progressing toward goals    Frequency    Min 3X/week      PT Plan Current plan remains appropriate    Co-evaluation              AM-PAC PT "6 Clicks" Mobility   Outcome Measure  Help needed turning from your back to your side while in a flat bed without using bedrails?: None Help needed moving from lying on your back to sitting on the side of a flat bed without  using bedrails?: None Help needed moving to and from a bed to a chair (including a wheelchair)?: A Little Help needed standing up from a chair using your arms (e.g., wheelchair or bedside chair)?: A Little Help needed to walk in hospital room?: A Little Help needed climbing 3-5 steps with a railing? : A Little 6 Click Score: 20    End of Session   Activity Tolerance: Patient tolerated treatment well Patient left: in chair;with call bell/phone within reach Nurse Communication: Mobility status PT Visit Diagnosis: Other abnormalities of gait and mobility (R26.89);Difficulty in walking, not elsewhere classified (R26.2);Muscle weakness (generalized) (M62.81)     Time: 1962-2297 PT Time Calculation (min) (ACUTE ONLY): 32 min  Charges:  $Gait Training: 8-22 mins $Therapeutic Exercise: 8-22 mins                      Mabeline Caras, PT, DPT Acute Rehabilitation Services  Personal: [facility] Office: [place] 03/18/2022, 10:10 AM

## 2022-03-19 ENCOUNTER — Inpatient Hospital Stay (HOSPITAL_COMMUNITY): Payer: BC Managed Care – PPO

## 2022-03-19 DIAGNOSIS — I5082 Biventricular heart failure: Secondary | ICD-10-CM

## 2022-03-19 DIAGNOSIS — J810 Acute pulmonary edema: Secondary | ICD-10-CM | POA: Diagnosis not present

## 2022-03-19 DIAGNOSIS — J9601 Acute respiratory failure with hypoxia: Secondary | ICD-10-CM

## 2022-03-19 DIAGNOSIS — I509 Heart failure, unspecified: Secondary | ICD-10-CM | POA: Diagnosis not present

## 2022-03-19 DIAGNOSIS — J189 Pneumonia, unspecified organism: Secondary | ICD-10-CM

## 2022-03-19 DIAGNOSIS — I480 Paroxysmal atrial fibrillation: Secondary | ICD-10-CM | POA: Diagnosis not present

## 2022-03-19 DIAGNOSIS — N179 Acute kidney failure, unspecified: Secondary | ICD-10-CM | POA: Diagnosis not present

## 2022-03-19 DIAGNOSIS — R570 Cardiogenic shock: Secondary | ICD-10-CM | POA: Diagnosis not present

## 2022-03-19 LAB — BASIC METABOLIC PANEL
Anion gap: 15 (ref 5–15)
BUN: 40 mg/dL — ABNORMAL HIGH (ref 6–20)
CO2: 26 mmol/L (ref 22–32)
Calcium: 9.1 mg/dL (ref 8.9–10.3)
Chloride: 89 mmol/L — ABNORMAL LOW (ref 98–111)
Creatinine, Ser: 3.07 mg/dL — ABNORMAL HIGH (ref 0.61–1.24)
GFR, Estimated: 25 mL/min — ABNORMAL LOW (ref >60)
Glucose, Bld: 113 mg/dL — ABNORMAL HIGH (ref 70–99)
Potassium: 3.5 mmol/L (ref 3.5–5.1)
Sodium: 130 mmol/L — ABNORMAL LOW (ref 135–145)

## 2022-03-19 LAB — COOXEMETRY PANEL
Carboxyhemoglobin: 2.4 % — ABNORMAL HIGH (ref 0.5–1.5)
Carboxyhemoglobin: 1.6 % — ABNORMAL HIGH (ref 0.5–1.5)
Methemoglobin: <0.7 % (ref 0.0–1.5)
Methemoglobin: 0.8 % (ref 0.0–1.5)
O2 Saturation: 89.5 %
O2 Saturation: 61.9 %
Total hemoglobin: 10.6 g/dL — ABNORMAL LOW (ref 12.0–16.0)
Total hemoglobin: 10 g/dL — ABNORMAL LOW (ref 12.0–16.0)

## 2022-03-19 LAB — CBC
HCT: 30.3 % — ABNORMAL LOW (ref 39.0–52.0)
Hemoglobin: 10.1 g/dL — ABNORMAL LOW (ref 13.0–17.0)
MCH: 27.7 pg (ref 26.0–34.0)
MCHC: 33.3 g/dL (ref 30.0–36.0)
MCV: 83.2 fL (ref 80.0–100.0)
Platelets: 239 10*3/uL (ref 150–400)
RBC: 3.64 MIL/uL — ABNORMAL LOW (ref 4.22–5.81)
RDW: 19.4 % — ABNORMAL HIGH (ref 11.5–15.5)
WBC: 8.5 10*3/uL (ref 4.0–10.5)
nRBC: 0 % (ref 0.0–0.2)

## 2022-03-19 LAB — HEPARIN LEVEL (UNFRACTIONATED): Heparin Unfractionated: 0.49 IU/mL (ref 0.30–0.70)

## 2022-03-19 LAB — GLUCOSE, CAPILLARY
Glucose-Capillary: 107 mg/dL — ABNORMAL HIGH (ref 70–99)
Glucose-Capillary: 121 mg/dL — ABNORMAL HIGH (ref 70–99)
Glucose-Capillary: 127 mg/dL — ABNORMAL HIGH (ref 70–99)
Glucose-Capillary: 128 mg/dL — ABNORMAL HIGH (ref 70–99)
Glucose-Capillary: 139 mg/dL — ABNORMAL HIGH (ref 70–99)
Glucose-Capillary: 147 mg/dL — ABNORMAL HIGH (ref 70–99)

## 2022-03-19 MED ORDER — ENSURE ENLIVE PO LIQD
237.0000 mL | Freq: Two times a day (BID) | ORAL | Status: DC
  Administered 2022-03-20 – 2022-03-21 (×3): 237 mL via ORAL

## 2022-03-19 MED ORDER — POTASSIUM CHLORIDE 10 MEQ/50ML IV SOLN
10.0000 meq | INTRAVENOUS | Status: AC
  Administered 2022-03-19 (×2): 10 meq via INTRAVENOUS
  Filled 2022-03-19 (×2): qty 50

## 2022-03-19 NOTE — Progress Notes (Addendum)
Advanced Heart Failure Rounding Note   Subjective:    9/4 extubated. Stable on RA  9/5 went back into Afib/AFL w/ RVR, rebolused w/ amio. NE discontinued. Lasix gtt reduced to 10/hr   9/6 beside DCCV>>NSR. Abx started for possible PNA. PCT 1.35. Lasix gtt discontinued.   Maintaining NSR this morning. HR 90s. On amio gtt at 60/hr.   Remains on Milrinone 0.5. Initial Co-ox 90%. Doubt accurate. Repeat pending.   2L in UOP yesterday. Wt up 1 lb off diuretics. CVP 9   Scr trending back up 2.58>>2.71>>3.07. K 3.5    SBPs ok, 110s   Afebrile. WBC nl at 8.5.   Feels ok this morning. Tired. Just woke up. No dyspnea. C/w productive cough. No subjective fever/chills   Objective:   Weight Range:  Vital Signs:   Temp:  [98 F (36.7 C)-98.6 F (37 C)] 98.6 F (37 C) (09/07 0400) Pulse Rate:  [87-118] 91 (09/07 0600) Resp:  [14-37] 23 (09/07 0600) BP: (96-142)/(75-112) 114/89 (09/07 0600) SpO2:  [92 %-100 %] 92 % (09/07 0600) Weight:  [78.2 kg] 78.2 kg (09/07 0500) Last BM Date : 03/13/22  Weight change: Filed Weights   03/17/22 0500 03/18/22 0500 03/19/22 0500  Weight: 78.8 kg 77.8 kg 78.2 kg    Intake/Output:   Intake/Output Summary (Last 24 hours) at 03/19/2022 0712 Last data filed at 03/19/2022 0600 Gross per 24 hour  Intake 2393.64 ml  Output 2020 ml  Net 373.64 ml    PHYSICAL EXAM: CVP 9 General: fatigued appearing. No respiratory difficulty HEENT: normal Neck: supple JVD 10 cm. Carotids 2+ bilat; no bruits. No lymphadenopathy or thyromegaly appreciated. Cor: PMI nondisplaced. RRR. Sternotomy site ok.  No rubs, gallops or murmurs. Lungs: faint crackles at the bases bilaterally, L>R  Abdomen: soft, nontender, nondistended. No hepatosplenomegaly. No bruits or masses. Good bowel sounds. Extremities: no cyanosis, clubbing, rash, edema, LUE PICC  Neuro: alert & oriented x 3, cranial nerves grossly intact. moves all 4 extremities w/o difficulty. Affect pleasant.    Telemetry: NSR 90s Personally reviewed   Labs: Basic Metabolic Panel: Recent Labs  Lab 03/14/22 1007 03/14/22 1642 03/14/22 1956 03/15/22 0500 03/15/22 1659 03/16/22 0447 03/17/22 0530 03/18/22 0408 03/19/22 0411  NA  --   --    < > 137  --  133* 131* 131* 130*  K  --   --    < > 4.6  --  3.7 3.3* 3.5 3.5  CL  --   --    < > 97*  --  94* 85* 87* 89*  CO2  --   --    < > 24  --  '24 29 26 26  '$ GLUCOSE  --   --    < > 126*  --  109* 136* 115* 113*  BUN  --   --    < > 39*  --  41* 36* 32* 40*  CREATININE  --   --    < > 2.56*  --  2.65* 2.58* 2.71* 3.07*  CALCIUM  --   --    < > 9.1  --  8.9 9.0 9.1 9.1  MG 1.9 2.1  --  1.9 2.3  --  2.0 1.9  --   PHOS 3.8 5.2*  --  4.9* 4.7*  --   --   --   --    < > = values in this interval not displayed.    Liver Function Tests: Recent Labs  Lab 03/13/22 1009 03/13/22 1410  AST 31 43*  ALT 25 28  ALKPHOS 112 98  BILITOT 1.4* 1.4*  PROT 7.0 6.3*  ALBUMIN 3.1* 2.7*   No results for input(s): "LIPASE", "AMYLASE" in the last 168 hours. No results for input(s): "AMMONIA" in the last 168 hours.  CBC: Recent Labs  Lab 03/15/22 0500 03/16/22 0447 03/17/22 0530 03/18/22 0408 03/19/22 0411  WBC 8.9 8.7 8.2 7.6 8.5  HGB 9.9* 9.2* 10.0* 10.1* 10.1*  HCT 31.0* 27.8* 29.9* 29.9* 30.3*  MCV 86.4 84.0 83.3 82.4 83.2  PLT 302 261 242 240 239    Cardiac Enzymes: No results for input(s): "CKTOTAL", "CKMB", "CKMBINDEX", "TROPONINI" in the last 168 hours.  BNP: BNP (last 3 results) Recent Labs    02/19/22 1002 03/13/22 1007 03/13/22 1410  BNP 3,100.6* 2,469.0* 3,264.6*    ProBNP (last 3 results) No results for input(s): "PROBNP" in the last 8760 hours.    Other results:  Imaging: DG CHEST PORT 1 VIEW  Result Date: 03/18/2022 CLINICAL DATA:  Recent heart surgery. EXAM: PORTABLE CHEST 1 VIEW COMPARISON:  03/16/2022 FINDINGS: Interval extubation. Interval removal of the nasogastric tube. Left-sided PICC line with the tip  projecting over the upper SVC. Interval development of a trace right pleural effusion and right lower lobe airspace disease which may reflect atelectasis versus pneumonia. Left lower lobe airspace disease likely reflecting atelectasis. No pneumothorax. Stable cardiomegaly. Aortic valve replacement. Low Median sternotomy.  No acute osseous abnormality. IMPRESSION: 1. Interval development of a trace right pleural effusion and right lower lobe airspace disease which may reflect atelectasis versus pneumonia. 2. Left lower lobe airspace disease likely reflecting atelectasis. Electronically Signed   By: Kathreen Devoid M.D.   On: 03/18/2022 10:29     Medications:     Scheduled Medications:  aspirin  81 mg Oral Daily   Chlorhexidine Gluconate Cloth  6 each Topical Daily   insulin aspart  0-15 Units Subcutaneous Q4H   polyethylene glycol  17 g Oral Daily   senna  1 tablet Oral Daily   sodium chloride flush  3 mL Intravenous Q12H    Infusions:  sodium chloride     sodium chloride Stopped (03/15/22 1704)   sodium chloride 20 mL/hr at 03/18/22 1800   amiodarone 60 mg/hr (03/19/22 0639)   ceFEPime (MAXIPIME) IV 2 g (03/19/22 0415)   furosemide (LASIX) 200 mg in dextrose 5 % 100 mL (2 mg/mL) infusion Stopped (03/18/22 1004)   heparin 2,000 Units/hr (03/19/22 5784)   milrinone 0.5 mcg/kg/min (03/19/22 6962)   norepinephrine (LEVOPHED) Adult infusion Stopped (03/17/22 0949)   potassium chloride 10 mEq (03/19/22 0641)    PRN Medications: sodium chloride, Place/Maintain arterial line **AND** sodium chloride, acetaminophen, ondansetron (ZOFRAN) IV, mouth rinse, sodium chloride flush   Assessment/Plan:   1. A/C Systolic Biventricular HF-->Cardiogenic Shock - Repeat ECHO - LVEF 5-10%  RV severely reduced. IVC dilated. AVR ok - On home milrinone 0.5 mcg. Off NE. Co-ox 90%. Repeat pending  - Volume improved w/ diuresis. CVP 9  - W/ RV failure and AKI will continue to hold diuretics today  - GDMT  limited by renal fx  - Will need OHTx +/- kidney in near future   2. Acute Hypoxic Respiratory Failure - Extubated 9/4 - stable on RA   3. A fib RVR -Had DC-CV 02/26/22--> SR -Emergent DC-CV 03/13/22 ->SR -Repeat DCCV 9/3>>NSR  -Back in Afib/AFL >>DCCV 9/6>>NSR  - Remains in NSR today -Continue gtt at 60/hr (continue until  off inotropes) -Continue heparin -Supp lytes, keep K > 4.0 and Mg > 2.0. d/w pharmacy    4. AkI on CKD Stage IIIb - SCr 2.2 -> 2.4 -> 2.1 -> 2.5 ->2.7->2.58->2.71->3.0   - due to ATN/shock - continue hemodynamic support - daily BMET   5. S/P AVR  -H/O AV repair 2018 with complex valvuloplasty including plication of left coronary leaflet and aortic ring annuloplasty -AVR [facility] 02/2022  -Repeat Echo - valve ok.   6. ? PNA vs Atelectasis  - + productive cough, CXR suggestive of possible RLL PNA vs Axt - PCT 1.35 but also in the setting of renal dysfunction  - continue empiric abx coverage w/ cefepime per CCM  - encouraged use of IS    Length of Stay: 6   Brittainy Simmons PA-C  03/19/2022, 7:12 AM  Advanced Heart Failure Team Pager [phone] (M-F; 7a - 4p)  Please contact [facility] for night-coverage after hours (4p -7a ) and weekends on [email or web address]   Patient seen and examined with the above-signed Advanced Practice Provider and/or Housestaff. I personally reviewed laboratory data, imaging studies and relevant notes. I independently examined the patient and formulated the important aspects of the plan. I have edited the note to reflect any of my changes or salient points. I have personally discussed the plan with the patient and/or family.  Remains in NSR after DC-CV yesterday. On IV amio. Remains on milrinone. Co-ox ok. Lasix gtt stopped.  JVP 9  Scr slightly up today.   Having productive cough. On abx for RLL infiltrate with elevated PCT  General:  Weak appearing. No resp difficulty HEENT: normal Neck: supple. JVP 9 Carotids 2+ bilat; no bruits. No  lymphadenopathy or thryomegaly appreciated. Cor: PMI nondisplaced. Regular rate & rhythm. No rubs, gallops or murmurs. Lungs: basilar crackles  Abdomen: soft, nontender, nondistended. No hepatosplenomegaly. No bruits or masses. Good bowel sounds. Extremities: no cyanosis, clubbing, rash, edema Neuro: alert & orientedx3, cranial nerves grossly intact. moves all 4 extremities w/o difficulty. Affect pleasant  Back in NSR. Co-ox ok. Volume status looks good. Scr up. Continue to hold diuretics. Continue abx. Mobilize  - hold diuretics - Keep MAP > 70 - Continue IV amio - Continue IV abx - Encourage IS - Mobilize.  - Continue heparin and milrinone  Glori Bickers, MD  8:30 AM

## 2022-03-19 NOTE — Progress Notes (Signed)
Nutrition Follow-up  DOCUMENTATION CODES:   Not applicable  INTERVENTION:   Ensure Enlive po BID, each supplement provides 350 kcal and 20 grams of protein.   NUTRITION DIAGNOSIS:   Inadequate oral intake related to inability to eat as evidenced by NPO status.  Being addressed via TF   GOAL:   Patient will meet greater than or equal to 90% of their needs  Progressing  MONITOR:   Vent status, TF tolerance, I & O's, Labs  REASON FOR ASSESSMENT:   Consult Enteral/tube feeding initiation and management  ASSESSMENT:   41 y.o male presented to [facility] ED with dyspnea and required intubation in the ED. Pt recently admitted in July and August. PMH includes CHF, CKD III, DM, and HTN. Pt admitted with cardiogenic shock in the setting of biventricular heart failure, AKI on CKD, and acute respiratory failure due to pulmonary edema.  9/02 TF initiated at rate of 20 9/04 Extubated 9/05 Diet advanced to CL 9/06 DCCV, diet advanced to Heart Healthy post cardioversion  Pt ambulating around unit with RN today  Diet advanced to Heart Healthy yesterday, tolerating but appetite fair this AM. Recorded po intake yesterday on advanced diet was 90%. Per chart review, pt received TF for less than 24 hours and has only been on solid food diet for 24 hours. Pt with inadequate nutrition since admission  Off Pressors, off lasix gtt-diuretics on hold Current wt 78.2 kg; admit weight around 90 kg. Net negative 12L. Some mild edema on exam  Labs: sodium 130 (L), BUN 40, Creatinine 3.07, CBGs 121-222 Meds: ss novolog, miralax, senokot   Diet Order:   Diet Order             Diet Heart Room service appropriate? Yes; Fluid consistency: Thin  Diet effective now                   EDUCATION NEEDS:   Not appropriate for education at this time  Skin:  Skin Assessment: Reviewed RN Assessment  Last BM:  9/7  Height:   Ht Readings from Last 1 Encounters:  03/13/22 '5\' 10"'$  (1.778 m)     Weight:   Wt Readings from Last 1 Encounters:  03/19/22 78.2 kg    Ideal Body Weight:  75.5 kg  BMI:  Body mass index is 24.74 kg/m.  Estimated Nutritional Needs:   Kcal:  2100-2300  Protein:  105-120 grams  Fluid:  >/= 2 L    Kerman Passey MS, RDN, LDN, CNSC Registered Dietitian 3 Clinical Nutrition RD Pager and On-Call Pager Number Located in [place]

## 2022-03-19 NOTE — Progress Notes (Addendum)
   NAME:  [name] [name], MRN:  [id number], DOB:  October 17, 1980, LOS: 6 ADMISSION DATE:  03/13/2022, CONSULTATION DATE:  9/1 REFERRING MD:  Marigene Ehlers, CHIEF COMPLAINT:  dyspnea   History of Present Illness:  41 y/o male with severe NICM who just underwent high risk AVR with bioprosthetic valve at [facility] on 8/13,  d/c'd home on 8/29 admitted to [facility] on 9/1 from [facility] with respiratory failure in setting of volume overload.   Required intubation, mechanical ventilation. AFwRVR requiring cardioversion.   Pertinent  Medical History  Systolic congestive heart failure, CKD stage II, hypertension, hyperlipidemia, status post aortic valve repair, mitral regurgitation, and anxiety  Significant Hospital Events: Including procedures, antibiotic start and stop dates in addition to other pertinent events   9/1 for shortness of breath and palpitations found in decompensated heart failure requiring intubation greater than protection, cardioversion attempt by heart failure; left double lumen PICC prior to admission 9/3 cardioversion, continued lasix infusion 9/4 Diuresed 6L on lasix infusion, wants ETT out. / Extubated. 9/6 DCCV  9/7 off NE. Holding diuresis    Interim History / Subjective:  NAEO  Weaned off NE  Coox earlier this morning 90, repeat down to 62  Cr bump to 3.07  Objective   Blood pressure (!) 120/91, pulse 92, temperature 97.8 F (36.6 C), temperature source Oral, resp. rate 19, height '5\' 10"'$  (1.778 m), weight 78.2 kg, SpO2 96 %. CVP:  [8 mmHg-12 mmHg] 11 mmHg      Intake/Output Summary (Last 24 hours) at 03/19/2022 0921 Last data filed at 03/19/2022 0900 Gross per 24 hour  Intake 2849.61 ml  Output 1595 ml  Net 1254.61 ml   Filed Weights   03/17/22 0500 03/18/22 0500 03/19/22 0500  Weight: 78.8 kg 77.8 kg 78.2 kg    Examination:  General: WDWN middle aged M in recliner NAD  HEENT: NCAT pink mm Neuro: AAOx4 following commands CV: NSR  PULM:   even unlabored. Clear  GI: soft ndnt   Extremities: lower extremities wrapped  Skin: c/d/w.    Resolved Hospital Problem list     Assessment & Plan:   Cardiogenic shock in setting of BiV HF S/p bioprosthetic AVR (8/13 at [facility]) MR Afib RVR s/p DCCV 9/6 -- now NSR P -milrinone 0.5 -cont heparin, amio -follow CVP, coox  -MAP goal >70 -hold diuresis today   Possible PNA bilateral lower lobe Small bilateral pleural effusion -cefepime 7d  -pulm hygiene, IS  CKD IV Hypokalemia Hyponatremia P -Trend BMP  Hyperglycemia -SSI  Best Practice (right click and "Reselect all SmartList Selections" daily)  Diet/type: clear liquids DVT prophylaxis: systemic heparin GI prophylaxis: PPI Lines: Central line and yes and it is still needed (PICC from 7/31) Foley:  Yes, and it is still needed Code Status:  full code Last date of multidisciplinary goals of care discussion: per primary  Pt updated 9/7   CRITICAL CARE Performed by: Cristal Generous N/a  Eliseo Gum MSN, AGACNP-BC [place] for pager 03/19/2022, 11:13 AM

## 2022-03-19 NOTE — Progress Notes (Signed)
ANTICOAGULATION CONSULT NOTE - Follow Up Consult  Pharmacy Consult for heparin Indication: atrial fibrillation  No Known Allergies  Patient Measurements: Height: '5\' 10"'$  (177.8 cm) Weight: 77.8 kg (171 lb 8.3 oz) IBW/kg (Calculated) : 73 Heparin Dosing Weight: 92.5kg  Vital Signs: Temp: 98 F (36.7 C) (09/06 2000) Temp Source: Oral (09/06 2000) BP: 119/92 (09/06 2000) Pulse Rate: 96 (09/06 2000)  Labs: Recent Labs    03/17/22 0530 03/17/22 1346 03/18/22 0408 03/19/22 0411  HGB 10.0*  --  10.1* 10.1*  HCT 29.9*  --  29.9* 30.3*  PLT 242  --  240 239  HEPARINUNFRC 0.71* 0.57 0.53 0.49  CREATININE 2.58*  --  2.71* 3.07*     Estimated Creatinine Clearance: 33 mL/min (A) (by C-G formula based on SCr of 3.07 mg/dL (H)).   Assessment: 41 yo M w/ PMH afib who was prescribed apixaban at time of last discharge (but received therapy during his inpatient stay). Patient was unable to pick up outpatient Rx, therefore last dose was during his admission 8/29 (>48h).   Heparin level is therapeutic at 0.49 units/mL this AM. CBC is stable and no bleeding noted. Creatinine has increased to 3.07.  Goal of Therapy:  Heparin level 0.3-0.7 units/ml Monitor platelets by anticoagulation protocol: Yes   Plan:  Continue heparin infusion at 2000 units/hr Daily Heparin level, PTT and CBC   Thank you for allowing pharmacy to participate in this patient's care.  Reatha Harps, PharmD PGY2 Pharmacy Resident 03/19/2022 5:44 AM Check [email or web address] for unit specific pharmacy number

## 2022-03-20 ENCOUNTER — Inpatient Hospital Stay (HOSPITAL_COMMUNITY): Payer: BC Managed Care – PPO

## 2022-03-20 ENCOUNTER — Other Ambulatory Visit (HOSPITAL_COMMUNITY): Payer: Self-pay

## 2022-03-20 DIAGNOSIS — J810 Acute pulmonary edema: Secondary | ICD-10-CM | POA: Diagnosis not present

## 2022-03-20 DIAGNOSIS — E871 Hypo-osmolality and hyponatremia: Secondary | ICD-10-CM

## 2022-03-20 DIAGNOSIS — I509 Heart failure, unspecified: Secondary | ICD-10-CM | POA: Diagnosis not present

## 2022-03-20 DIAGNOSIS — J189 Pneumonia, unspecified organism: Secondary | ICD-10-CM | POA: Diagnosis not present

## 2022-03-20 DIAGNOSIS — J90 Pleural effusion, not elsewhere classified: Secondary | ICD-10-CM | POA: Diagnosis not present

## 2022-03-20 DIAGNOSIS — R570 Cardiogenic shock: Secondary | ICD-10-CM | POA: Diagnosis not present

## 2022-03-20 DIAGNOSIS — I5082 Biventricular heart failure: Secondary | ICD-10-CM | POA: Diagnosis not present

## 2022-03-20 LAB — BASIC METABOLIC PANEL
Anion gap: 15 (ref 5–15)
BUN: 44 mg/dL — ABNORMAL HIGH (ref 6–20)
CO2: 21 mmol/L — ABNORMAL LOW (ref 22–32)
Calcium: 9 mg/dL (ref 8.9–10.3)
Chloride: 93 mmol/L — ABNORMAL LOW (ref 98–111)
Creatinine, Ser: 3.02 mg/dL — ABNORMAL HIGH (ref 0.61–1.24)
GFR, Estimated: 26 mL/min — ABNORMAL LOW (ref >60)
Glucose, Bld: 156 mg/dL — ABNORMAL HIGH (ref 70–99)
Potassium: 4.7 mmol/L (ref 3.5–5.1)
Sodium: 129 mmol/L — ABNORMAL LOW (ref 135–145)

## 2022-03-20 LAB — BASIC METABOLIC PANEL WITH GFR
Anion gap: 13 (ref 5–15)
BUN: 39 mg/dL — ABNORMAL HIGH (ref 6–20)
CO2: 23 mmol/L (ref 22–32)
Calcium: 8.7 mg/dL — ABNORMAL LOW (ref 8.9–10.3)
Chloride: 87 mmol/L — ABNORMAL LOW (ref 98–111)
Creatinine, Ser: 2.79 mg/dL — ABNORMAL HIGH (ref 0.61–1.24)
GFR, Estimated: 28 mL/min — ABNORMAL LOW
Glucose, Bld: 210 mg/dL — ABNORMAL HIGH (ref 70–99)
Potassium: 3.4 mmol/L — ABNORMAL LOW (ref 3.5–5.1)
Sodium: 123 mmol/L — ABNORMAL LOW (ref 135–145)

## 2022-03-20 LAB — GLUCOSE, CAPILLARY
Glucose-Capillary: 134 mg/dL — ABNORMAL HIGH (ref 70–99)
Glucose-Capillary: 149 mg/dL — ABNORMAL HIGH (ref 70–99)
Glucose-Capillary: 206 mg/dL — ABNORMAL HIGH (ref 70–99)
Glucose-Capillary: 154 mg/dL — ABNORMAL HIGH (ref 70–99)
Glucose-Capillary: 111 mg/dL — ABNORMAL HIGH (ref 70–99)

## 2022-03-20 LAB — PROCALCITONIN: Procalcitonin: 0.99 ng/mL

## 2022-03-20 LAB — COOXEMETRY PANEL
Carboxyhemoglobin: 1.8 % — ABNORMAL HIGH (ref 0.5–1.5)
Methemoglobin: <0.7 % (ref 0.0–1.5)
O2 Saturation: 61.8 %
Total hemoglobin: 10.1 g/dL — ABNORMAL LOW (ref 12.0–16.0)

## 2022-03-20 LAB — CBC
HCT: 29.6 % — ABNORMAL LOW (ref 39.0–52.0)
Hemoglobin: 9.7 g/dL — ABNORMAL LOW (ref 13.0–17.0)
MCH: 27.1 pg (ref 26.0–34.0)
MCHC: 32.8 g/dL (ref 30.0–36.0)
MCV: 82.7 fL (ref 80.0–100.0)
Platelets: 222 10*3/uL (ref 150–400)
RBC: 3.58 MIL/uL — ABNORMAL LOW (ref 4.22–5.81)
RDW: 18.9 % — ABNORMAL HIGH (ref 11.5–15.5)
WBC: 7.6 10*3/uL (ref 4.0–10.5)
nRBC: 0 % (ref 0.0–0.2)

## 2022-03-20 LAB — HEPARIN LEVEL (UNFRACTIONATED): Heparin Unfractionated: 0.51 IU/mL (ref 0.30–0.70)

## 2022-03-20 MED ORDER — TOLVAPTAN 15 MG PO TABS
15.0000 mg | ORAL_TABLET | Freq: Once | ORAL | Status: AC
  Administered 2022-03-20: 15 mg via ORAL
  Filled 2022-03-20: qty 1

## 2022-03-20 MED ORDER — APIXABAN 5 MG PO TABS
5.0000 mg | ORAL_TABLET | Freq: Two times a day (BID) | ORAL | 2 refills | Status: DC
  Filled 2022-03-20: qty 60, 30d supply, fill #0

## 2022-03-20 MED ORDER — TOLVAPTAN 15 MG PO TABS
15.0000 mg | ORAL_TABLET | Freq: Once | ORAL | Status: DC
  Filled 2022-03-20: qty 1

## 2022-03-20 MED ORDER — POTASSIUM CHLORIDE 10 MEQ/50ML IV SOLN
10.0000 meq | INTRAVENOUS | Status: AC
  Administered 2022-03-20 (×4): 10 meq via INTRAVENOUS
  Filled 2022-03-20 (×4): qty 50

## 2022-03-20 MED ORDER — APIXABAN 5 MG PO TABS
5.0000 mg | ORAL_TABLET | Freq: Two times a day (BID) | ORAL | Status: DC
  Administered 2022-03-20 – 2022-03-23 (×7): 5 mg via ORAL
  Filled 2022-03-20 (×8): qty 1

## 2022-03-20 MED ORDER — TEMAZEPAM 30 MG PO CAPS
30.0000 mg | ORAL_CAPSULE | Freq: Every day | ORAL | Status: AC
Start: 2022-03-20 — End: 2022-03-20
  Administered 2022-03-20: 30 mg via ORAL
  Filled 2022-03-20: qty 1

## 2022-03-20 MED ORDER — INSULIN GLARGINE-YFGN 100 UNIT/ML ~~LOC~~ SOLN
5.0000 [IU] | Freq: Every day | SUBCUTANEOUS | Status: DC
  Administered 2022-03-20 – 2022-03-23 (×4): 5 [IU] via SUBCUTANEOUS
  Filled 2022-03-20 (×4): qty 0.05

## 2022-03-20 MED ORDER — SPIRONOLACTONE 25 MG PO TABS
12.5000 mg | ORAL_TABLET | Freq: Every day | ORAL | 11 refills | Status: DC
  Filled 2022-03-20: qty 15, 30d supply, fill #0

## 2022-03-20 MED ORDER — FUROSEMIDE 10 MG/ML IJ SOLN
80.0000 mg | Freq: Once | INTRAMUSCULAR | Status: DC
Start: 2022-03-20 — End: 2022-03-20

## 2022-03-20 MED ORDER — CHLORHEXIDINE GLUCONATE 4 % EX LIQD
CUTANEOUS | Status: AC
  Filled 2022-03-20: qty 15

## 2022-03-20 MED ORDER — TORSEMIDE 20 MG PO TABS
40.0000 mg | ORAL_TABLET | Freq: Every day | ORAL | Status: DC
  Administered 2022-03-20 – 2022-03-23 (×4): 40 mg via ORAL
  Filled 2022-03-20 (×4): qty 2

## 2022-03-20 MED ORDER — MELATONIN 3 MG PO TABS: 3.0000 mg | ORAL_TABLET | Freq: Every day | ORAL | Status: DC

## 2022-03-20 MED ORDER — MELATONIN 5 MG PO TABS
10.0000 mg | ORAL_TABLET | ORAL | Status: DC
  Administered 2022-03-20 – 2022-03-22 (×3): 10 mg via ORAL
  Filled 2022-03-20 (×3): qty 2

## 2022-03-20 NOTE — Progress Notes (Signed)
NAME:  [name] [name], MRN:  [id number], DOB:  11-Jan-1981, LOS: 7 ADMISSION DATE:  03/13/2022, CONSULTATION DATE:  9/1 REFERRING MD:  Marigene Ehlers, CHIEF COMPLAINT:  dyspnea   History of Present Illness:  41 y/o male with severe NICM who just underwent high risk AVR with bioprosthetic valve at [facility] on 8/13,  d/c'd home on 8/29 admitted to [facility] on 9/1 from [facility] with respiratory failure in setting of volume overload.   Required intubation, mechanical ventilation. AFwRVR requiring cardioversion.   Pertinent  Medical History  Systolic congestive heart failure, CKD stage II, hypertension, hyperlipidemia, status post aortic valve repair, mitral regurgitation, and anxiety  Significant Hospital Events: Including procedures, antibiotic start and stop dates in addition to other pertinent events   9/1 for shortness of breath and palpitations found in decompensated heart failure requiring intubation greater than protection, cardioversion attempt by heart failure; left double lumen PICC prior to admission 9/3 cardioversion, continued lasix infusion 9/4 Diuresed 6L on lasix infusion, wants ETT out. / Extubated. 9/6 DCCV  9/7 off NE. Holding diuresis  9/8 plan to transfer out of ICU. Getting demadex +/- tolvaptan for drop in Na from 130 to 123   Interim History / Subjective:  Didn't sleep well, very tired Na down to 123 PCT down to 0.99   Objective   Blood pressure (!) 119/90, pulse 91, temperature 97.8 F (36.6 C), temperature source Oral, resp. rate (!) 26, height '5\' 10"'$  (1.778 m), weight 83.2 kg, SpO2 94 %. CVP:  [11 mmHg-12 mmHg] 12 mmHg      Intake/Output Summary (Last 24 hours) at 03/20/2022 1001 Last data filed at 03/20/2022 0900 Gross per 24 hour  Intake 1872.26 ml  Output 2050 ml  Net -177.74 ml   Filed Weights   03/18/22 0500 03/19/22 0500 03/20/22 0500  Weight: 77.8 kg 78.2 kg 83.2 kg    Examination:  General: WDWN middle aged M NAD in recliner  HEENT: NCAT pink mm anicteric  sclera Neuro: AAOx4 following commands  CV: NSR. Cap refill<  3 sec   PULM:  CTAb even unlabored on RA GI: soft ndnt + bowel sounds  Extremities: no acute joint deformity. LUE PICC  Skin: c/d/w  Resolved Hospital Problem list     Assessment & Plan:   Cardiogenic shock in setting of Biventricular heart failure S/p bioprosthetic AVR (02/22/22 at [place])  MR Afib RVR, s/p DCCV -- now NSR  P -cont milronone 0.5 -cont amio gtt -- plan to convert to PO per Adv HF -transition from hep gtt to eliquis  -starting daily torsemide  Bibasilar airspace disease -- possible PNA vs mild edema Small bilateral pleural effusions -7d course of cefepime for possible infection -- with history and risk factors, this is reasonable  -similarly, if following PCT and course is assuring in coming days could consider shortening abx to 5d course   CKD IV Hyponatremia Hypokalemia  P -Trend BMP -Na per Adv HF -- 1x tolvaptan, daily torsemide   Hyperglycemia -SSI     Dispo: -Plans to transfer out of ICU 9/8. PCCM will sign off. Please let us know if we can be of further assistance    Best Practice (right click and "Reselect all SmartList Selections" daily)  Diet/type: Regular consistency (see orders) DVT prophylaxis: systemic heparin GI prophylaxis: PPI Lines: Central line and yes and it is still needed (PICC from 7/31) Foley:  N/A Code Status:  full code Last date of multidisciplinary goals of care discussion: per primary  Pt updated  9/8   CCT: n/a  Eliseo Gum MSN, AGACNP-BC [place] for pager  03/20/2022, 10:01 AM

## 2022-03-20 NOTE — Progress Notes (Signed)
ANTICOAGULATION CONSULT NOTE - Follow Up Consult  Pharmacy Consult for heparin Indication: atrial fibrillation  No Known Allergies  Patient Measurements: Height: '5\' 10"'$  (177.8 cm) Weight: 78.2 kg (172 lb 6.4 oz) IBW/kg (Calculated) : 73 Heparin Dosing Weight: 92.5kg  Vital Signs: Temp: 98.1 F (36.7 C) (09/08 0315) Temp Source: Oral (09/08 0315) BP: 123/92 (09/08 0300) Pulse Rate: 91 (09/08 0300)  Labs: Recent Labs    03/18/22 0408 03/19/22 0411 03/20/22 0436  HGB 10.1* 10.1* 9.7*  HCT 29.9* 30.3* 29.6*  PLT 240 239 222  HEPARINUNFRC 0.53 0.49 0.51  CREATININE 2.71* 3.07* 2.79*     Estimated Creatinine Clearance: 36.3 mL/min (A) (by C-G formula based on SCr of 2.79 mg/dL (H)).   Assessment: 41 yo M w/ PMH afib who was prescribed apixaban at time of last discharge (but received therapy during his inpatient stay). Patient was unable to pick up outpatient Rx, therefore last dose was during his admission 8/29 (>48h).   Heparin level is therapeutic at 0.51 units/mL this AM. CBC is stable and no bleeding noted. Creatinine has improved from 3.07 to 2.79. HF team has ordered transition to Eliquis.  Goal of Therapy:  Heparin level 0.3-0.7 units/ml Monitor platelets by anticoagulation protocol: Yes   Plan:  Stop heparin infusion  Start Eliquis 5 mg BID  Thank you for allowing pharmacy to participate in this patient's care.  Reatha Harps, PharmD PGY2 Pharmacy Resident 03/20/2022 5:49 AM Check [email or web address] for unit specific pharmacy number

## 2022-03-20 NOTE — TOC Initial Note (Addendum)
Transition of Care [facility]) - Initial/Assessment Note    Patient Details  Name: [name] [name] MRN: [id number] Date of Birth: [birth year]  Transition of [facility] CM/SW Contact:    Bethena Roys, RN Phone Number: 03/20/2022, 3:28 PM  Clinical Narrative: Risk for readmission assessment completed. PTA patient was home with spouse; patient is currently on home Milrinone. Patient is currently active with [facility] for [facility] RN and [employer] for IV Milrinone. Case Manager received a secure chat that the patient will possibly transition home over the weekend. [facility] and [facility] are aware and following. Weekend Case Manager to follow for additional transition of care needs.                  Expected Discharge Plan: [place] Barriers to Discharge: Continued Medical Work up   Patient Goals and CMS Choice Patient states their goals for this hospitalization and ongoing recovery are:: plan to go home on IV Milrinone.      Expected Discharge Plan and Services Expected Discharge Plan: [place] In-house Referral: NA Discharge Planning Services: CM Consult Post Acute Care Choice: Resumption of Svcs/PTA Provider, Home Health Living arrangements for the past 2 months: Single Family Home                          HH Arranged: RN (IV Milrinone gtt) [place] Agency: [place] ([place]) Date [place]: 03/20/22 Time [place]: 1527 Representative spoke with at [place]: Caryl Pina and Jeannene Patella with [employer]  Prior Living Arrangements/Services Living arrangements for the past 2 months: Single Family Home Lives with:: Spouse Patient language and need for interpreter reviewed:: Yes Do you feel safe going back to the place where you live?: Yes      Need for Family Participation in Patient Care: Yes (Comment) Care giver support system in place?: Yes (comment) Current home services: DME (IV Milrinone) Criminal Activity/Legal  Involvement Pertinent to Current Situation/Hospitalization: No - Comment as needed  Activities of Daily Living      Permission Sought/Granted Permission sought to share information with : Family Supports, Customer service manager, Case Production designer, theatre/television/film granted to share info w AGENCY: [facility], Pension scheme manager        Emotional Assessment Appearance:: Appears stated age       Alcohol / Substance Use: Not Applicable Psych Involvement: No (comment)  Admission diagnosis:  Acute cor pulmonale (HCC) [I26.09] CHF (congestive heart failure) ([place]) [I50.9] Acute pulmonary edema ([place]) [J81.0] Acute respiratory failure with hypoxia ([place]) [J96.01] Atrial fibrillation with RVR ([place]) [I48.91] Acute on chronic congestive heart failure, unspecified heart failure type ([place]) [I50.9] Patient Active Problem List   Diagnosis Date Noted   Hyponatremia    Pleural effusion    Biventricular heart failure ([place])    Pneumonia due to infectious organism    Acute respiratory failure with hypoxia ([place])    Acute pulmonary edema ([place])    Acute on chronic combined systolic and diastolic heart failure ([place]) 02/19/2022   Sinus tachycardia 02/19/2022   Diabetes mellitus with complication ([place]) 54/62/7035   Cardiogenic shock ([place])    Heart failure with acute decompensation, type unknown ([place]) 02/09/2022   CHF exacerbation ([place]) 02/08/2022   Inguinal hernia 02/08/2022   Constipation 02/08/2022   Anasarca    Acute heart failure reduced EF  12/23/2021   S/P aortic valve repair 05/11/2017   Aortic valve regurgitation  Chronic systolic CHF (congestive heart failure) ([place])    Hypertensive emergency 05/30/2015   Essential hypertension 11/22/2014   Shift work sleep disorder 10/30/2014   Obstructive sleep apnea 10/30/2014   Hypertensive crisis 09/26/2014   Hypertensive urgency 06/13/2013   CHF (congestive heart failure) ([place]) 06/13/2013   AKI on CKD3 06/13/2013   Nonischemic cardiomyopathy ([place])  06/13/2013   HTN (hypertension) 02/25/2012   Chronic combined systolic and diastolic heart failure ([place]) 01/08/2012   Elevated troponin 01/01/2012   Acute on chronic combined systolic and diastolic CHF (congestive heart failure) ([place]) 12/31/2011   Hypokalemia 12/31/2011   Chest pain 12/31/2011   AKI (acute kidney injury) ([place]) 12/31/2011   PCP:  Sharilyn Sites, MD Pharmacy:   [place], [place] - [address] [place] [address] Phone: [phone] Fax: [phone]  [facility] [address] [address] Phone: [phone] Fax: [phone]    Readmission Risk Interventions    03/20/2022    3:24 PM  Readmission Risk Prevention Plan  Transportation Screening Complete  PCP or Specialist Appt within 3-5 Days Complete  HRI or Home Care Consult Complete  Social Work Consult for [place] Planning/Counseling Complete  Palliative Care Screening Not Applicable  Medication Review Press photographer) Referral to Pharmacy

## 2022-03-20 NOTE — Progress Notes (Signed)
Physical Therapy Treatment/ discharge Patient Details Name: [name] [name] MRN: [id number] DOB: [birth year] Today's Date: 03/20/2022   History of Present Illness Pt is a 41 y.o. male admitted 03/13/22 from [facility] with acute on chronic heart failure, pulmonary edema and respiratory failure; ETT 9/1-9/4. DCCV 9/3 & 9/6. PMH includes recent AVR at [facility] (02/21/22), biventricular heart failure, NICM, HTN, AKI, AVR 2018, hernia, DM.    PT Comments    Pt very pleasant and reports frustration from lack of sleep and eager to return home. Pt able to perform stairs, transfers and gait without physical assist. Assist for lines and pt with good awareness of precautions. Pt has met acute goals with education for walking program and progression. No further acute needs with pt and wife aware and agreeable.   HR 94-110 SpO2 97%     Recommendations for follow up therapy are one component of a multi-disciplinary discharge planning process, led by the attending physician.  Recommendations may be updated based on patient status, additional functional criteria and insurance authorization.  Follow Up Recommendations  No PT follow up     Assistance Recommended at Discharge PRN  Patient can return home with the following Assistance with cooking/housework;Assist for transportation   Equipment Recommendations  None recommended by PT    Recommendations for Other Services       Precautions / Restrictions Precautions Precautions: Sternal     Mobility  Bed Mobility               General bed mobility comments: in chair on arrival and end of session    Transfers Overall transfer level: Needs assistance     Sit to Stand: Supervision           General transfer comment: supervision for lines only    Ambulation/Gait Ambulation/Gait assistance: Supervision Gait Distance (Feet): 800 Feet Assistive device: None Gait Pattern/deviations: Step-through pattern, Decreased stride length   Gait  velocity interpretation: >2.62 ft/sec, indicative of community ambulatory   General Gait Details: pt with increased speed and stride without need for environmental support, supervision for lines only with pt aware of direction. HR 94-110   Stairs Stairs: Yes   Stair Management: One rail Right, Alternating pattern, Forwards Number of Stairs: 8     Wheelchair Mobility    Modified Rankin (Stroke Patients Only)       Balance Overall balance assessment: No apparent balance deficits (not formally assessed)                                          Cognition Arousal/Alertness: Awake/alert Behavior During Therapy: WFL for tasks assessed/performed Overall Cognitive Status: Within Functional Limits for tasks assessed                                          Exercises      General Comments        Pertinent Vitals/Pain Pain Assessment Pain Assessment: No/denies pain    Home Living                          Prior Function            PT Goals (current goals can now be found in the care plan section) Progress towards PT goals:  Goals met/education completed, patient discharged from PT    Frequency           PT Plan Current plan remains appropriate    Co-evaluation              AM-PAC PT "6 Clicks" Mobility   Outcome Measure  Help needed turning from your back to your side while in a flat bed without using bedrails?: None Help needed moving from lying on your back to sitting on the side of a flat bed without using bedrails?: A Little Help needed moving to and from a bed to a chair (including a wheelchair)?: None Help needed standing up from a chair using your arms (e.g., wheelchair or bedside chair)?: None Help needed to walk in hospital room?: None Help needed climbing 3-5 steps with a railing? : None 6 Click Score: 23    End of Session   Activity Tolerance: Patient tolerated treatment well Patient left: in  chair;with call bell/phone within reach;with family/visitor present Nurse Communication: Mobility status PT Visit Diagnosis: Other abnormalities of gait and mobility (R26.89)     Time: 2355-7322 PT Time Calculation (min) (ACUTE ONLY): 22 min  Charges:  $Gait Training: 8-22 mins                     [place] Office: [place] 03/20/2022, 12:53 PM

## 2022-03-20 NOTE — Plan of Care (Signed)
  Problem: Activity: Goal: Ability to tolerate increased activity will improve Outcome: Progressing   Problem: Respiratory: Goal: Ability to maintain a clear airway and adequate ventilation will improve Outcome: Progressing   Problem: Role Relationship: Goal: Method of communication will improve Outcome: Progressing   Problem: Education: Goal: Understanding of CV disease, CV risk reduction, and recovery process will improve Outcome: Progressing Goal: Individualized Educational Video(s) Outcome: Progressing   Problem: Activity: Goal: Ability to return to baseline activity level will improve Outcome: Progressing   Problem: Cardiovascular: Goal: Ability to achieve and maintain adequate cardiovascular perfusion will improve Outcome: Progressing Goal: Vascular access site(s) Level 0-1 will be maintained Outcome: Progressing   Problem: Health Behavior/Discharge Planning: Goal: Ability to safely manage health-related needs after discharge will improve Outcome: Progressing   Problem: Education: Goal: Knowledge of General Education information will improve Description: Including pain rating scale, medication(s)/side effects and non-pharmacologic comfort measures Outcome: Progressing   Problem: Health Behavior/Discharge Planning: Goal: Ability to manage health-related needs will improve Outcome: Progressing   Problem: Clinical Measurements: Goal: Ability to maintain clinical measurements within normal limits will improve Outcome: Progressing Goal: Will remain free from infection Outcome: Progressing Goal: Diagnostic test results will improve Outcome: Progressing Goal: Respiratory complications will improve Outcome: Progressing Goal: Cardiovascular complication will be avoided Outcome: Progressing   Problem: Activity: Goal: Risk for activity intolerance will decrease Outcome: Progressing   Problem: Nutrition: Goal: Adequate nutrition will be maintained Outcome:  Progressing   Problem: Coping: Goal: Level of anxiety will decrease Outcome: Progressing   Problem: Elimination: Goal: Will not experience complications related to bowel motility Outcome: Progressing Goal: Will not experience complications related to urinary retention Outcome: Progressing   Problem: Pain Managment: Goal: General experience of comfort will improve Outcome: Progressing   Problem: Safety: Goal: Ability to remain free from injury will improve Outcome: Progressing   Problem: Skin Integrity: Goal: Risk for impaired skin integrity will decrease Outcome: Progressing   Problem: Education: Goal: Ability to describe self-care measures that may prevent or decrease complications (Diabetes Survival Skills Education) will improve Outcome: Progressing Goal: Individualized Educational Video(s) Outcome: Progressing   Problem: Coping: Goal: Ability to adjust to condition or change in health will improve Outcome: Progressing   Problem: Fluid Volume: Goal: Ability to maintain a balanced intake and output will improve Outcome: Progressing

## 2022-03-20 NOTE — Progress Notes (Addendum)
Advanced Heart Failure Rounding Note   Subjective:    9/4 extubated. Stable on RA  9/5 went back into Afib/AFL w/ RVR, rebolused w/ amio. NE discontinued. Lasix gtt reduced to 10/hr   9/6 beside DCCV>>NSR. Abx started for possible PNA. PCT 1.35. Lasix gtt discontinued.     CO-OX 62% on milrinone 0.5  CVP 14-15 sitting up in chair. Not on diuretics d/t rising Scr. ? Weight up 9 lb overnight, doubt accurate. Mix of standing and bed weights charted.  Maintaining SR. Continues on amio gtt at 60/hr.   Scr 2.58>>2.71>>3.07>>2.79.  K 3.4 Na 131>130>123  Remains on cefepime for possible PNA. AF, no leukocytosis.  Ambulated 5 laps in hall yesterday. No dyspnea or orthopnea. Cough improving. Very tired, hasn't slept well in days.   Objective:    Vital Signs:   Temp:  [97.7 F (36.5 C)-98.3 F (36.8 C)] 98.1 F (36.7 C) (09/08 0315) Pulse Rate:  [87-96] 87 (09/08 0600) Resp:  [15-30] 19 (09/08 0600) BP: (110-127)/(80-103) 116/86 (09/08 0600) SpO2:  [93 %-99 %] 95 % (09/08 0600) Weight:  [83.2 kg] 83.2 kg (09/08 0500) Last BM Date : 03/19/22  Weight change: Filed Weights   03/18/22 0500 03/19/22 0500 03/20/22 0500  Weight: 77.8 kg 78.2 kg 83.2 kg    Intake/Output:   Intake/Output Summary (Last 24 hours) at 03/20/2022 0700 Last data filed at 03/20/2022 0600 Gross per 24 hour  Intake 3244.88 ml  Output 2050 ml  Net 1194.88 ml    PHYSICAL EXAM: General:  Fatigued appearing. HEENT: normal Neck: supple. JVP 14-16 cm. Carotids 2+ bilat; no bruits.  Cor: PMI nondisplaced. Regular rate & rhythm. No rubs, gallops or murmurs. Lungs: clear Abdomen: soft, nontender, nondistended.  Extremities: no cyanosis, clubbing, rash, + UNNA, LUE PICC Neuro: alert & orientedx3, cranial nerves grossly intact. moves all 4 extremities w/o difficulty. Affect pleasant    Telemetry: SR 90s (personally reviewed)  Labs: Basic Metabolic Panel: Recent Labs  Lab 03/14/22 1007  03/14/22 1642 03/14/22 1956 03/15/22 0500 03/15/22 1659 03/16/22 0447 03/17/22 0530 03/18/22 0408 03/19/22 0411 03/20/22 0436  NA  --   --    < > 137  --  133* 131* 131* 130* 123*  K  --   --    < > 4.6  --  3.7 3.3* 3.5 3.5 3.4*  CL  --   --    < > 97*  --  94* 85* 87* 89* 87*  CO2  --   --    < > 24  --  '24 29 26 26 23  '$ GLUCOSE  --   --    < > 126*  --  109* 136* 115* 113* 210*  BUN  --   --    < > 39*  --  41* 36* 32* 40* 39*  CREATININE  --   --    < > 2.56*  --  2.65* 2.58* 2.71* 3.07* 2.79*  CALCIUM  --   --    < > 9.1  --  8.9 9.0 9.1 9.1 8.7*  MG 1.9 2.1  --  1.9 2.3  --  2.0 1.9  --   --   PHOS 3.8 5.2*  --  4.9* 4.7*  --   --   --   --   --    < > = values in this interval not displayed.    Liver Function Tests: Recent Labs  Lab 03/13/22 1009 03/13/22 1410  AST 31 43*  ALT 25 28  ALKPHOS 112 98  BILITOT 1.4* 1.4*  PROT 7.0 6.3*  ALBUMIN 3.1* 2.7*   No results for input(s): "LIPASE", "AMYLASE" in the last 168 hours. No results for input(s): "AMMONIA" in the last 168 hours.  CBC: Recent Labs  Lab 03/16/22 0447 03/17/22 0530 03/18/22 0408 03/19/22 0411 03/20/22 0436  WBC 8.7 8.2 7.6 8.5 7.6  HGB 9.2* 10.0* 10.1* 10.1* 9.7*  HCT 27.8* 29.9* 29.9* 30.3* 29.6*  MCV 84.0 83.3 82.4 83.2 82.7  PLT 261 242 240 239 222    Cardiac Enzymes: No results for input(s): "CKTOTAL", "CKMB", "CKMBINDEX", "TROPONINI" in the last 168 hours.  BNP: BNP (last 3 results) Recent Labs    02/19/22 1002 03/13/22 1007 03/13/22 1410  BNP 3,100.6* 2,469.0* 3,264.6*    ProBNP (last 3 results) No results for input(s): "PROBNP" in the last 8760 hours.    Other results:  Imaging: DG CHEST PORT 1 VIEW  Result Date: 03/19/2022 CLINICAL DATA:  Chest pain EXAM: PORTABLE CHEST 1 VIEW COMPARISON:  Chest x-ray 03/18/2022 and prior chest CT 10/02/2020 FINDINGS: Stable surgical changes related to prior valve replacement surgery. Stable cardiac enlargement and prominent  mediastinal and hilar contours. Mild chronic central vascular congestion and small bilateral pleural effusions. Persistent bibasilar airspace opacity, infiltrate versus atelectasis. IMPRESSION: Stable cardiac enlargement, central vascular congestion, small bilateral pleural effusions and bibasilar airspace opacity. Electronically Signed   By: Marijo Sanes M.D.   On: 03/19/2022 08:24   DG CHEST PORT 1 VIEW  Result Date: 03/18/2022 CLINICAL DATA:  Recent heart surgery. EXAM: PORTABLE CHEST 1 VIEW COMPARISON:  03/16/2022 FINDINGS: Interval extubation. Interval removal of the nasogastric tube. Left-sided PICC line with the tip projecting over the upper SVC. Interval development of a trace right pleural effusion and right lower lobe airspace disease which may reflect atelectasis versus pneumonia. Left lower lobe airspace disease likely reflecting atelectasis. No pneumothorax. Stable cardiomegaly. Aortic valve replacement. Low Median sternotomy.  No acute osseous abnormality. IMPRESSION: 1. Interval development of a trace right pleural effusion and right lower lobe airspace disease which may reflect atelectasis versus pneumonia. 2. Left lower lobe airspace disease likely reflecting atelectasis. Electronically Signed   By: Kathreen Devoid M.D.   On: 03/18/2022 10:29     Medications:     Scheduled Medications:  aspirin  81 mg Oral Daily   Chlorhexidine Gluconate Cloth  6 each Topical Daily   feeding supplement  237 mL Oral BID BM   insulin aspart  0-15 Units Subcutaneous Q4H   polyethylene glycol  17 g Oral Daily   senna  1 tablet Oral Daily   sodium chloride flush  3 mL Intravenous Q12H    Infusions:  sodium chloride     sodium chloride Stopped (03/15/22 1704)   sodium chloride Stopped (03/19/22 0415)   amiodarone 60 mg/hr (03/20/22 0600)   ceFEPime (MAXIPIME) IV Stopped (03/20/22 0349)   heparin 2,000 Units/hr (03/20/22 0600)   milrinone 0.5 mcg/kg/min (03/20/22 0600)   norepinephrine (LEVOPHED)  Adult infusion Stopped (03/17/22 0949)   potassium chloride 10 mEq (03/20/22 0613)    PRN Medications: sodium chloride, Place/Maintain arterial line **AND** sodium chloride, acetaminophen, ondansetron (ZOFRAN) IV, mouth rinse, sodium chloride flush   Assessment/Plan:   1. A/C Systolic Biventricular HF-->Cardiogenic Shock - Repeat ECHO - LVEF 5-10%  RV severely reduced. IVC dilated. AVR ok - On home milrinone 0.5 mcg. Off NE. Co-ox 62%. - Volume improved w/ diuresis, diuretics now on hold after  Scr bumped. CVP 14-15 this am. Give 80 mg IV lasix once and assess response. Need to use caution not to overdiurese with RV failure - GDMT limited by renal fx  - Will need OHTx +/- kidney in near future   2. Acute Hypoxic Respiratory Failure - Extubated 9/4 - stable on RA   3. A fib RVR -Had DC-CV 02/26/22--> SR -Emergent DC-CV 03/13/22 ->SR -Repeat DCCV 9/3>>NSR  -Back in Afib/AFL >>DCCV 9/6>>NSR  -Remains in NSR today -Continue gtt at 60/hr (continue until off inotropes) -Continue heparin -Supp lytes, keep K > 4.0 and Mg > 2.0. d/w pharmacy    4. AkI on CKD Stage IIIb - SCr 2.2 -> 2.4 -> 2.1 -> 2.5 ->2.7->2.58->2.71->3.0->2.79 - due to ATN/shock - continue hemodynamic support - daily BMET   5. S/P AVR  -H/O AV repair 2018 with complex valvuloplasty including plication of left coronary leaflet and aortic ring annuloplasty -AVR [facility] 02/2022  -Repeat Echo - valve ok.   6. ? PNA vs Atelectasis  - + productive cough, CXR suggestive of possible RLL PNA vs Axt - PCT 1.35>>0.99 but also in the setting of renal dysfunction  - continue empiric abx coverage w/ cefepime per CCM  - encouraged use of IS   7. Hyponatremia - Na 323>557>322 - Give Tolvaptan 15 mg once - Restrict free water  8. Insomnia - Ordered melatonin   Continue to mobilize   Length of Stay: [place], LINDSAY N PA-C  03/20/2022, 7:00 AM  Advanced Heart Failure Team Pager [phone] (M-F; [place])  Please contact  [facility] for night-coverage after hours (4p -7a ) and weekends on [email or web address]   Agree with above.  Now off NE. Remains on home milrinone 0.5. Co-ox ok. Remains in NSR on amio 60/hr. On heparin. CVP 11-12 on my read. Scr better with holding diuretics. Na 123  Feels exhausted. Not sleeping. No SOB, orthopnea or PND.  General:  Sitting up in chair No resp difficulty HEENT: normal Neck: supple.JVP 12  Carotids 2+ bilat; no bruits. No lymphadenopathy or thryomegaly appreciated. Cor: PMI nondisplaced. Regular tachy Lungs: clear Abdomen: soft, nontender, nondistended. No hepatosplenomegaly. No bruits or masses. Good bowel sounds. Extremities: no cyanosis, clubbing, rash, edema Neuro: alert & orientedx3, cranial nerves grossly intact. moves all 4 extremities w/o difficulty. Affect pleasant  Remains tenuous but improving. Now off NE.   Plan: - continue milrinone - switch heparin to Eliquis - Continue amio @ 60/hr. Switch to 200 po bid tomorrow - Start torsemide 40 daily. - One dose tolvaptan today - Continue abx for 7-day course - Mobilize.  - Can go to stepdown   Once stable oral regimen established can go home on milrinone. Will need close f/u with [facility] for heart kidney/transplant.   CRITICAL CARE Performed by: Glori Bickers  Total critical care time: 35 minutes  Critical care time was exclusive of separately billable procedures and treating other patients.  Critical care was necessary to treat or prevent imminent or life-threatening deterioration.  Critical care was time spent personally by me (independent of midlevel providers or residents) on the following activities: development of treatment plan with patient and/or surrogate as well as nursing, discussions with consultants, evaluation of patient's response to treatment, examination of patient, obtaining history from patient or surrogate, ordering and performing treatments and interventions, ordering and review of  laboratory studies, ordering and review of radiographic studies, pulse oximetry and re-evaluation of patient's condition.  Glori Bickers, MD  8:13 AM

## 2022-03-21 DIAGNOSIS — I509 Heart failure, unspecified: Secondary | ICD-10-CM | POA: Diagnosis not present

## 2022-03-21 DIAGNOSIS — I5082 Biventricular heart failure: Secondary | ICD-10-CM | POA: Diagnosis not present

## 2022-03-21 LAB — BASIC METABOLIC PANEL
Anion gap: 16 — ABNORMAL HIGH (ref 5–15)
BUN: 42 mg/dL — ABNORMAL HIGH (ref 6–20)
CO2: 24 mmol/L (ref 22–32)
Calcium: 9.4 mg/dL (ref 8.9–10.3)
Chloride: 91 mmol/L — ABNORMAL LOW (ref 98–111)
Creatinine, Ser: 3 mg/dL — ABNORMAL HIGH (ref 0.61–1.24)
GFR, Estimated: 26 mL/min — ABNORMAL LOW (ref >60)
Glucose, Bld: 100 mg/dL — ABNORMAL HIGH (ref 70–99)
Potassium: 3.8 mmol/L (ref 3.5–5.1)
Sodium: 131 mmol/L — ABNORMAL LOW (ref 135–145)

## 2022-03-21 LAB — HEPATIC FUNCTION PANEL
ALT: 27 U/L (ref 0–44)
AST: 19 U/L (ref 15–41)
Albumin: 2.9 g/dL — ABNORMAL LOW (ref 3.5–5.0)
Alkaline Phosphatase: 73 U/L (ref 38–126)
Bilirubin, Direct: 0.4 mg/dL — ABNORMAL HIGH (ref 0.0–0.2)
Indirect Bilirubin: 0.6 mg/dL (ref 0.3–0.9)
Total Bilirubin: 1 mg/dL (ref 0.3–1.2)
Total Protein: 6.6 g/dL (ref 6.5–8.1)

## 2022-03-21 LAB — CBC
HCT: 30.9 % — ABNORMAL LOW (ref 39.0–52.0)
Hemoglobin: 10.2 g/dL — ABNORMAL LOW (ref 13.0–17.0)
MCH: 27.3 pg (ref 26.0–34.0)
MCHC: 33 g/dL (ref 30.0–36.0)
MCV: 82.6 fL (ref 80.0–100.0)
Platelets: 285 10*3/uL (ref 150–400)
RBC: 3.74 MIL/uL — ABNORMAL LOW (ref 4.22–5.81)
RDW: 19.5 % — ABNORMAL HIGH (ref 11.5–15.5)
WBC: 8.5 10*3/uL (ref 4.0–10.5)
nRBC: 0 % (ref 0.0–0.2)

## 2022-03-21 LAB — GLUCOSE, CAPILLARY
Glucose-Capillary: 103 mg/dL — ABNORMAL HIGH (ref 70–99)
Glucose-Capillary: 109 mg/dL — ABNORMAL HIGH (ref 70–99)
Glucose-Capillary: 149 mg/dL — ABNORMAL HIGH (ref 70–99)
Glucose-Capillary: 152 mg/dL — ABNORMAL HIGH (ref 70–99)
Glucose-Capillary: 164 mg/dL — ABNORMAL HIGH (ref 70–99)
Glucose-Capillary: 180 mg/dL — ABNORMAL HIGH (ref 70–99)

## 2022-03-21 LAB — MAGNESIUM: Magnesium: 2.2 mg/dL (ref 1.7–2.4)

## 2022-03-21 LAB — COOXEMETRY PANEL
Carboxyhemoglobin: 1.4 % (ref 0.5–1.5)
Methemoglobin: <0.7 % (ref 0.0–1.5)
O2 Saturation: 61.7 %
Total hemoglobin: 10.8 g/dL — ABNORMAL LOW (ref 12.0–16.0)

## 2022-03-21 LAB — PROCALCITONIN: Procalcitonin: 0.92 ng/mL

## 2022-03-21 MED ORDER — POTASSIUM CHLORIDE CRYS ER 20 MEQ PO TBCR
40.0000 meq | EXTENDED_RELEASE_TABLET | Freq: Once | ORAL | Status: AC
Start: 2022-03-21 — End: 2022-03-21
  Administered 2022-03-21: 40 meq via ORAL
  Filled 2022-03-21: qty 2

## 2022-03-21 MED ORDER — AMIODARONE HCL 200 MG PO TABS
200.0000 mg | ORAL_TABLET | Freq: Two times a day (BID) | ORAL | Status: DC
  Administered 2022-03-21 – 2022-03-23 (×5): 200 mg via ORAL
  Filled 2022-03-21 (×6): qty 1

## 2022-03-21 MED ORDER — TEMAZEPAM 15 MG PO CAPS
30.0000 mg | ORAL_CAPSULE | Freq: Every day | ORAL | Status: AC
Start: 2022-03-21 — End: 2022-03-21
  Administered 2022-03-21: 30 mg via ORAL
  Filled 2022-03-21: qty 2

## 2022-03-21 NOTE — Progress Notes (Signed)
Mobility Specialist Progress Note    03/21/22 1352  Mobility  Activity Ambulated independently in hallway  Level of Assistance Independent  Assistive Device None  Distance Ambulated (ft) 800 ft  Activity Response Tolerated well  $Mobility charge 1 Mobility   Pre-Mobility: 92 HR During Mobility: 97 HR Post-Mobility: 94 HR  Pt received in chair and agreeable. No complaints on walk. Returned to chair with call bell in reach.    Hildred Alamin Mobility Specialist

## 2022-03-21 NOTE — Progress Notes (Signed)
Patient ID: [name] [name], male   DOB: 01-Sep-1980, 41 y.o.   MRN: [id number]    Advanced Heart Failure Rounding Note   Subjective:    9/4 extubated. Stable on RA  9/5 went back into Afib/AFL w/ RVR, rebolused w/ amio. NE discontinued. Lasix gtt reduced to 10/hr   9/6 beside DCCV>>NSR. Abx started for possible PNA. PCT 1.35. Lasix gtt discontinued.   CO-OX 62% on milrinone 0.5. CVP 9 today.  Good diuresis yesterday, weight down 4 lbs.  Got torsemide yesterday.  He is now on torsemide.   He is in NSR on amiodarone gtt.   Scr 2.58>>2.71>>3.07>>2.79>>3.02>>3.  K 3.8 Na 131>130>123>131  Remains on cefepime for possible PNA. AF, no leukocytosis.  Feels better.    Objective:    Vital Signs:   Temp:  [97.6 F (36.4 C)-98.7 F (37.1 C)] 97.7 F (36.5 C) (09/09 0719) Pulse Rate:  [92-97] 94 (09/09 0316) Resp:  [20-36] 25 (09/09 0719) BP: (117-130)/(90-100) 126/90 (09/09 0719) SpO2:  [91 %-98 %] 96 % (09/09 0719) Weight:  [81.1 kg-82.7 kg] 81.1 kg (09/09 0510) Last BM Date : 03/20/22  Weight change: Filed Weights   03/20/22 0500 03/20/22 2013 03/21/22 0510  Weight: 83.2 kg 82.7 kg 81.1 kg    Intake/Output:   Intake/Output Summary (Last 24 hours) at 03/21/2022 1049 Last data filed at 03/21/2022 0448 Gross per 24 hour  Intake 764.84 ml  Output 4280 ml  Net -3515.16 ml    PHYSICAL EXAM: General: NAD Neck: No JVD, no thyromegaly or thyroid nodule.  Lungs: Clear to auscultation bilaterally with normal respiratory effort. CV: Nondisplaced PMI.  Heart regular S1/S2, no S3/S4, 1/6 SEM RUSB.  No peripheral edema.   Abdomen: Soft, nontender, no hepatosplenomegaly, no distention.  Skin: Intact without lesions or rashes.  Neurologic: Alert and oriented x 3.  Psych: Normal affect. Extremities: No clubbing or cyanosis.  HEENT: Normal.    Telemetry: SR 90s (personally reviewed)  Labs: Basic Metabolic Panel: Recent Labs  Lab 03/14/22 1642 03/14/22 1956 03/15/22 0500  03/15/22 1659 03/16/22 0447 03/17/22 0530 03/18/22 0408 03/19/22 0411 03/20/22 0436 03/20/22 1809 03/21/22 0330  NA  --    < > 137  --    < > 131* 131* 130* 123* 129* 131*  K  --    < > 4.6  --    < > 3.3* 3.5 3.5 3.4* 4.7 3.8  CL  --    < > 97*  --    < > 85* 87* 89* 87* 93* 91*  CO2  --    < > 24  --    < > '29 26 26 23 '$ 21* 24  GLUCOSE  --    < > 126*  --    < > 136* 115* 113* 210* 156* 100*  BUN  --    < > 39*  --    < > 36* 32* 40* 39* 44* 42*  CREATININE  --    < > 2.56*  --    < > 2.58* 2.71* 3.07* 2.79* 3.02* 3.00*  CALCIUM  --    < > 9.1  --    < > 9.0 9.1 9.1 8.7* 9.0 9.4  MG 2.1  --  1.9 2.3  --  2.0 1.9  --   --   --   --   PHOS 5.2*  --  4.9* 4.7*  --   --   --   --   --   --   --    < > =  values in this interval not displayed.    Liver Function Tests: Recent Labs  Lab 03/21/22 0330  AST 19  ALT 27  ALKPHOS 73  BILITOT 1.0  PROT 6.6  ALBUMIN 2.9*   No results for input(s): "LIPASE", "AMYLASE" in the last 168 hours. No results for input(s): "AMMONIA" in the last 168 hours.  CBC: Recent Labs  Lab 03/17/22 0530 03/18/22 0408 03/19/22 0411 03/20/22 0436 03/21/22 0330  WBC 8.2 7.6 8.5 7.6 8.5  HGB 10.0* 10.1* 10.1* 9.7* 10.2*  HCT 29.9* 29.9* 30.3* 29.6* 30.9*  MCV 83.3 82.4 83.2 82.7 82.6  PLT 242 240 239 222 285    Cardiac Enzymes: No results for input(s): "CKTOTAL", "CKMB", "CKMBINDEX", "TROPONINI" in the last 168 hours.  BNP: BNP (last 3 results) Recent Labs    02/19/22 1002 03/13/22 1007 03/13/22 1410  BNP 3,100.6* 2,469.0* 3,264.6*    ProBNP (last 3 results) No results for input(s): "PROBNP" in the last 8760 hours.    Other results:  Imaging: DG CHEST PORT 1 VIEW  Result Date: 03/20/2022 CLINICAL DATA:  A 41 year old male presents with chest discomfort. EXAM: PORTABLE CHEST 1 VIEW COMPARISON:  March 19, 2022. FINDINGS: EKG leads project over the chest. Post median sternotomy for aortic valve replacement. Marked cardiomegaly  similar to previous imaging. Hilar structures are stable. Patchy interstitial and alveolar opacities at the RIGHT lung base are similar to prior imaging. There is no lobar consolidation. There is no pneumothorax. Small RIGHT and likely LEFT pleural effusions. On limited assessment there is no acute skeletal finding. IMPRESSION: 1. Marked cardiomegaly similar to previous imaging. 2. Small effusions and basilar airspace opacity greatest on the RIGHT is unchanged. Query mild background edema, difficult to exclude mild pneumonitis at the RIGHT lung base. Electronically Signed   By: Zetta Bills M.D.   On: 03/20/2022 08:13     Medications:     Scheduled Medications:  apixaban  5 mg Oral BID   aspirin  81 mg Oral Daily   Chlorhexidine Gluconate Cloth  6 each Topical Daily   feeding supplement  237 mL Oral BID BM   insulin aspart  0-15 Units Subcutaneous Q4H   insulin glargine-yfgn  5 Units Subcutaneous Daily   melatonin  10 mg Oral Q24 Hr x 5   polyethylene glycol  17 g Oral Daily   senna  1 tablet Oral Daily   sodium chloride flush  3 mL Intravenous Q12H   torsemide  40 mg Oral Daily    Infusions:  sodium chloride     sodium chloride Stopped (03/15/22 1704)   amiodarone 60 mg/hr (03/21/22 0738)   ceFEPime (MAXIPIME) IV 2 g (03/21/22 0315)   milrinone 0.5 mcg/kg/min (03/20/22 1600)    PRN Medications: sodium chloride, Place/Maintain arterial line **AND** sodium chloride, acetaminophen, ondansetron (ZOFRAN) IV, mouth rinse, sodium chloride flush   Assessment/Plan:   1. A/C Systolic Biventricular HF-->Cardiogenic Shock - Repeat ECHO - LVEF 5-10%  RV severely reduced. IVC dilated. AVR ok - On home milrinone 0.5 mcg. Off NE. Co-ox 62%. - Volume status better, good diuresis yesterday with tolvaptan.  CVP 9 today.  Continue po torsemide.  - GDMT limited by renal fx  - Will need OHTx +/- kidney in near future   2. Acute Hypoxic Respiratory Failure - Extubated 9/4 - stable on RA   3.  A fib RVR -Had DC-CV 02/26/22--> SR -Emergent DC-CV 03/13/22 ->SR -Repeat DCCV 9/3>>NSR  -Back in Afib/AFL >>DCCV 9/6>>NSR  -Remains in NSR today -  Transition from IV to po amiodarone today.  -Continue apixaban.  -Supp lytes, keep K > 4.0 and Mg > 2.0. d/w pharmacy    4. AkI on CKD Stage IIIb - SCr 2.2 -> 2.4 -> 2.1 -> 2.5 ->2.7->2.58->2.71->3.0->2.79->3  - due to ATN/shock - continue hemodynamic support with milrinone.  - daily BMET   5. S/P AVR  -H/O AV repair 2018 with complex valvuloplasty including plication of left coronary leaflet and aortic ring annuloplasty -AVR [facility] 02/2022  -Repeat Echo - valve ok.   6. ? PNA vs Atelectasis  - + productive cough, CXR suggestive of possible RLL PNA vs Axt - PCT 1.35>>0.99 but also in the setting of renal dysfunction  - continue empiric abx coverage w/ cefepime per CCM  - encouraged use of IS   7. Hyponatremia - Na 131>130>123>tolvaptan>131 - Restrict free water  8. Insomnia - Ordered melatonin   Continue to mobilize, hopefully home Monday.    Length of Stay: [place]  03/21/2022, 10:49 AM  Advanced Heart Failure Team Pager [phone] (M-F; 7a - 4p)  Please contact [facility] for night-coverage after hours (4p -7a ) and weekends on [email or web address]

## 2022-03-22 DIAGNOSIS — I5043 Acute on chronic combined systolic (congestive) and diastolic (congestive) heart failure: Secondary | ICD-10-CM | POA: Diagnosis not present

## 2022-03-22 DIAGNOSIS — I509 Heart failure, unspecified: Secondary | ICD-10-CM | POA: Diagnosis not present

## 2022-03-22 LAB — CBC
HCT: 30 % — ABNORMAL LOW (ref 39.0–52.0)
Hemoglobin: 10 g/dL — ABNORMAL LOW (ref 13.0–17.0)
MCH: 27.4 pg (ref 26.0–34.0)
MCHC: 33.3 g/dL (ref 30.0–36.0)
MCV: 82.2 fL (ref 80.0–100.0)
Platelets: 294 10*3/uL (ref 150–400)
RBC: 3.65 MIL/uL — ABNORMAL LOW (ref 4.22–5.81)
RDW: 19.7 % — ABNORMAL HIGH (ref 11.5–15.5)
WBC: 7.8 10*3/uL (ref 4.0–10.5)
nRBC: 0 % (ref 0.0–0.2)

## 2022-03-22 LAB — COOXEMETRY PANEL
Carboxyhemoglobin: 2.3 % — ABNORMAL HIGH (ref 0.5–1.5)
Methemoglobin: <0.7 % (ref 0.0–1.5)
O2 Saturation: 61.8 %
Total hemoglobin: 10.3 g/dL — ABNORMAL LOW (ref 12.0–16.0)

## 2022-03-22 LAB — BASIC METABOLIC PANEL
Anion gap: 11 (ref 5–15)
BUN: 36 mg/dL — ABNORMAL HIGH (ref 6–20)
CO2: 25 mmol/L (ref 22–32)
Calcium: 9.2 mg/dL (ref 8.9–10.3)
Chloride: 97 mmol/L — ABNORMAL LOW (ref 98–111)
Creatinine, Ser: 2.55 mg/dL — ABNORMAL HIGH (ref 0.61–1.24)
GFR, Estimated: 32 mL/min — ABNORMAL LOW (ref >60)
Glucose, Bld: 131 mg/dL — ABNORMAL HIGH (ref 70–99)
Potassium: 4 mmol/L (ref 3.5–5.1)
Sodium: 133 mmol/L — ABNORMAL LOW (ref 135–145)

## 2022-03-22 LAB — GLUCOSE, CAPILLARY
Glucose-Capillary: 109 mg/dL — ABNORMAL HIGH (ref 70–99)
Glucose-Capillary: 140 mg/dL — ABNORMAL HIGH (ref 70–99)
Glucose-Capillary: 181 mg/dL — ABNORMAL HIGH (ref 70–99)
Glucose-Capillary: 90 mg/dL (ref 70–99)
Glucose-Capillary: 99 mg/dL (ref 70–99)

## 2022-03-22 MED ORDER — TEMAZEPAM 15 MG PO CAPS
30.0000 mg | ORAL_CAPSULE | Freq: Every evening | ORAL | Status: DC | PRN
Start: 2022-03-22 — End: 2022-03-23
  Administered 2022-03-22: 30 mg via ORAL
  Filled 2022-03-22: qty 2

## 2022-03-22 MED ORDER — SODIUM CHLORIDE 0.9% FLUSH: 10.0000 mL | INTRAVENOUS | Status: DC | PRN

## 2022-03-22 MED ORDER — INSULIN ASPART 100 UNIT/ML IJ SOLN
0.0000 [IU] | Freq: Three times a day (TID) | INTRAMUSCULAR | Status: DC
  Administered 2022-03-22: 3 [IU] via SUBCUTANEOUS
  Administered 2022-03-22: 2 [IU] via SUBCUTANEOUS
  Administered 2022-03-23: 3 [IU] via SUBCUTANEOUS

## 2022-03-22 MED ORDER — ALTEPLASE 2 MG IJ SOLR
2.0000 mg | Freq: Once | INTRAMUSCULAR | Status: AC
  Administered 2022-03-22: 2 mg
  Filled 2022-03-22: qty 2

## 2022-03-22 MED ORDER — SODIUM CHLORIDE 0.9% FLUSH
10.0000 mL | Freq: Two times a day (BID) | INTRAVENOUS | Status: DC
  Administered 2022-03-22: 20 mL
  Administered 2022-03-23: 10 mL

## 2022-03-22 NOTE — Progress Notes (Signed)
Patient ID: [name] [name], male   DOB: 1980/12/23, 41 y.o.   MRN: [id number]   Advanced Heart Failure Rounding Note   Subjective:    9/4 extubated. Stable on RA  9/5 went back into Afib/AFL w/ RVR, rebolused w/ amio. NE discontinued. Lasix gtt reduced to 10/hr   9/6 beside DCCV>>NSR. Abx started for possible PNA. PCT 1.35. Lasix gtt discontinued.   No co-ox this morning.  He remains on milrinone 0.5. CVP 7-8 today.  Weight down 1 lb.    He is in NSR on po amiodarone.   Scr 2.58>>2.71>>3.07>>2.79>>3.02>>3>>2.55.  Na 131>130>123>131>133  Remains on cefepime for possible PNA. AF, no leukocytosis.  No dyspnea, feels good.    Objective:    Vital Signs:   Temp:  [97.8 F (36.6 C)-98 F (36.7 C)] 98 F (36.7 C) (09/10 0752) Pulse Rate:  [93] 93 (09/10 0251) Resp:  [20-32] 30 (09/10 0752) BP: (110-131)/(84-103) 124/98 (09/10 0752) SpO2:  [96 %-97 %] 97 % (09/10 0752) Weight:  [80.4 kg] 80.4 kg (09/10 0500) Last BM Date : 03/20/22  Weight change: Filed Weights   03/20/22 2013 03/21/22 0510 03/22/22 0500  Weight: 82.7 kg 81.1 kg 80.4 kg    Intake/Output:   Intake/Output Summary (Last 24 hours) at 03/22/2022 1008 Last data filed at 03/22/2022 0700 Gross per 24 hour  Intake 720 ml  Output 3200 ml  Net -2480 ml    PHYSICAL EXAM: General: NAD Neck:JVP 8 cm, no thyromegaly or thyroid nodule.  Lungs: Clear to auscultation bilaterally with normal respiratory effort. CV: Nondisplaced PMI.  Heart regular S1/S2, no S3/S4, 1/6 SEM RUSB.  No peripheral edema.   Abdomen: Soft, nontender, no hepatosplenomegaly, no distention.  Skin: Intact without lesions or rashes.  Neurologic: Alert and oriented x 3.  Psych: Normal affect. Extremities: No clubbing or cyanosis.  HEENT: Normal.    Telemetry: SR 90s (personally reviewed)  Labs: Basic Metabolic Panel: Recent Labs  Lab 03/15/22 1659 03/16/22 0447 03/17/22 0530 03/18/22 0408 03/19/22 0411 03/20/22 0436 03/20/22 1809  03/21/22 0330 03/21/22 1600 03/22/22 0653  NA  --    < > 131* 131* 130* 123* 129* 131*  --  133*  K  --    < > 3.3* 3.5 3.5 3.4* 4.7 3.8  --  4.0  CL  --    < > 85* 87* 89* 87* 93* 91*  --  97*  CO2  --    < > '29 26 26 23 '$ 21* 24  --  25  GLUCOSE  --    < > 136* 115* 113* 210* 156* 100*  --  131*  BUN  --    < > 36* 32* 40* 39* 44* 42*  --  36*  CREATININE  --    < > 2.58* 2.71* 3.07* 2.79* 3.02* 3.00*  --  2.55*  CALCIUM  --    < > 9.0 9.1 9.1 8.7* 9.0 9.4  --  9.2  MG 2.3  --  2.0 1.9  --   --   --   --  2.2  --   PHOS 4.7*  --   --   --   --   --   --   --   --   --    < > = values in this interval not displayed.    Liver Function Tests: Recent Labs  Lab 03/21/22 0330  AST 19  ALT 27  ALKPHOS 73  BILITOT 1.0  PROT 6.6  ALBUMIN 2.9*   No results for input(s): "LIPASE", "AMYLASE" in the last 168 hours. No results for input(s): "AMMONIA" in the last 168 hours.  CBC: Recent Labs  Lab 03/18/22 0408 03/19/22 0411 03/20/22 0436 03/21/22 0330 03/22/22 0653  WBC 7.6 8.5 7.6 8.5 7.8  HGB 10.1* 10.1* 9.7* 10.2* 10.0*  HCT 29.9* 30.3* 29.6* 30.9* 30.0*  MCV 82.4 83.2 82.7 82.6 82.2  PLT 240 239 222 285 294    Cardiac Enzymes: No results for input(s): "CKTOTAL", "CKMB", "CKMBINDEX", "TROPONINI" in the last 168 hours.  BNP: BNP (last 3 results) Recent Labs    02/19/22 1002 03/13/22 1007 03/13/22 1410  BNP 3,100.6* 2,469.0* 3,264.6*    ProBNP (last 3 results) No results for input(s): "PROBNP" in the last 8760 hours.    Other results:  Imaging: No results found.   Medications:     Scheduled Medications:  amiodarone  200 mg Oral BID   apixaban  5 mg Oral BID   aspirin  81 mg Oral Daily   Chlorhexidine Gluconate Cloth  6 each Topical Daily   feeding supplement  237 mL Oral BID BM   insulin aspart  0-15 Units Subcutaneous Q4H   insulin glargine-yfgn  5 Units Subcutaneous Daily   melatonin  10 mg Oral Q24 Hr x 5   polyethylene glycol  17 g Oral Daily    senna  1 tablet Oral Daily   sodium chloride flush  10-40 mL Intracatheter Q12H   sodium chloride flush  3 mL Intravenous Q12H   torsemide  40 mg Oral Daily    Infusions:  sodium chloride     sodium chloride Stopped (03/15/22 1704)   ceFEPime (MAXIPIME) IV 2 g (03/22/22 0340)   milrinone 0.5 mcg/kg/min (03/22/22 0639)    PRN Medications: sodium chloride, Place/Maintain arterial line **AND** sodium chloride, acetaminophen, ondansetron (ZOFRAN) IV, mouth rinse, sodium chloride flush, sodium chloride flush   Assessment/Plan:   1. A/C Systolic Biventricular HF-->Cardiogenic Shock - Repeat ECHO - LVEF 5-10%  RV severely reduced. IVC dilated. AVR ok - On home milrinone 0.5 mcg. Off NE. Pending co-ox today but has been stable.  - CVP 8 on po torsemide. Creatinine trending down.  No changes today.   - GDMT limited by renal fx  - Will need OHTx +/- kidney in near future   2. Acute Hypoxic Respiratory Failure - Extubated 9/4 - stable on RA   3. A fib RVR -Had DC-CV 02/26/22--> SR -Emergent DC-CV 03/13/22 ->SR -Repeat DCCV 9/3>>NSR  -Back in Afib/AFL >>DCCV 9/6>>NSR  -Remains in NSR today -Continue po amiodarone.  -Continue apixaban.  -Supp lytes, keep K > 4.0 and Mg > 2.0. d/w pharmacy    4. AkI on CKD Stage IIIb - SCr 2.2 -> 2.4 -> 2.1 -> 2.5 ->2.7->2.58->2.71->3.0->2.79->3->2.55  - due to ATN/shock - continue hemodynamic support with milrinone.  - daily BMET   5. S/P AVR  -H/O AV repair 2018 with complex valvuloplasty including plication of left coronary leaflet and aortic ring annuloplasty -AVR [facility] 02/2022  -Repeat Echo - valve ok.   6. ? PNA vs Atelectasis  - + productive cough, CXR suggestive of possible RLL PNA vs Axt - PCT 1.35>>0.99 but also in the setting of renal dysfunction  - continue empiric abx coverage w/ cefepime per CCM  - encouraged use of IS   7. Hyponatremia - Na 131>130>123>tolvaptan>131>133 - Restrict free water  8. Insomnia - Improved.     Continue to mobilize, hopefully home Monday.  Length of Stay: [place]  03/22/2022, 10:08 AM  Advanced Heart Failure Team Pager [phone] (M-F; 7a - 4p)  Please contact [facility] for night-coverage after hours (4p -7a ) and weekends on [email or web address]

## 2022-03-22 NOTE — Plan of Care (Signed)
  Problem: Activity: Goal: Ability to tolerate increased activity will improve Outcome: Progressing   Problem: Respiratory: Goal: Ability to maintain a clear airway and adequate ventilation will improve Outcome: Progressing   Problem: Role Relationship: Goal: Method of communication will improve Outcome: Progressing   Problem: Education: Goal: Understanding of CV disease, CV risk reduction, and recovery process will improve Outcome: Progressing Goal: Individualized Educational Video(s) Outcome: Progressing   Problem: Activity: Goal: Ability to return to baseline activity level will improve Outcome: Progressing   Problem: Cardiovascular: Goal: Ability to achieve and maintain adequate cardiovascular perfusion will improve Outcome: Progressing Goal: Vascular access site(s) Level 0-1 will be maintained Outcome: Progressing   Problem: Health Behavior/Discharge Planning: Goal: Ability to safely manage health-related needs after discharge will improve Outcome: Progressing   Problem: Education: Goal: Knowledge of General Education information will improve Description: Including pain rating scale, medication(s)/side effects and non-pharmacologic comfort measures Outcome: Progressing   Problem: Health Behavior/Discharge Planning: Goal: Ability to manage health-related needs will improve Outcome: Progressing   Problem: Clinical Measurements: Goal: Ability to maintain clinical measurements within normal limits will improve Outcome: Progressing Goal: Will remain free from infection Outcome: Progressing Goal: Diagnostic test results will improve Outcome: Progressing Goal: Respiratory complications will improve Outcome: Progressing Goal: Cardiovascular complication will be avoided Outcome: Progressing   Problem: Activity: Goal: Risk for activity intolerance will decrease Outcome: Progressing   Problem: Nutrition: Goal: Adequate nutrition will be maintained Outcome:  Progressing   Problem: Coping: Goal: Level of anxiety will decrease Outcome: Progressing   Problem: Elimination: Goal: Will not experience complications related to bowel motility Outcome: Progressing Goal: Will not experience complications related to urinary retention Outcome: Progressing   Problem: Pain Managment: Goal: General experience of comfort will improve Outcome: Progressing   Problem: Safety: Goal: Ability to remain free from injury will improve Outcome: Progressing   Problem: Skin Integrity: Goal: Risk for impaired skin integrity will decrease Outcome: Progressing   Problem: Education: Goal: Ability to describe self-care measures that may prevent or decrease complications (Diabetes Survival Skills Education) will improve Outcome: Progressing Goal: Individualized Educational Video(s) Outcome: Progressing   Problem: Coping: Goal: Ability to adjust to condition or change in health will improve Outcome: Progressing   Problem: Fluid Volume: Goal: Ability to maintain a balanced intake and output will improve Outcome: Progressing   Problem: Health Behavior/Discharge Planning: Goal: Ability to identify and utilize available resources and services will improve Outcome: Progressing Goal: Ability to manage health-related needs will improve Outcome: Progressing   Problem: Metabolic: Goal: Ability to maintain appropriate glucose levels will improve Outcome: Progressing   Problem: Nutritional: Goal: Maintenance of adequate nutrition will improve Outcome: Progressing Goal: Progress toward achieving an optimal weight will improve Outcome: Progressing   Problem: Skin Integrity: Goal: Risk for impaired skin integrity will decrease Outcome: Progressing   Problem: Tissue Perfusion: Goal: Adequacy of tissue perfusion will improve Outcome: Progressing

## 2022-03-23 ENCOUNTER — Other Ambulatory Visit (HOSPITAL_COMMUNITY): Payer: Self-pay

## 2022-03-23 DIAGNOSIS — J810 Acute pulmonary edema: Secondary | ICD-10-CM | POA: Diagnosis not present

## 2022-03-23 DIAGNOSIS — I509 Heart failure, unspecified: Secondary | ICD-10-CM | POA: Diagnosis not present

## 2022-03-23 DIAGNOSIS — I480 Paroxysmal atrial fibrillation: Secondary | ICD-10-CM | POA: Diagnosis not present

## 2022-03-23 LAB — CBC
HCT: 28.7 % — ABNORMAL LOW (ref 39.0–52.0)
Hemoglobin: 9.2 g/dL — ABNORMAL LOW (ref 13.0–17.0)
MCH: 27.2 pg (ref 26.0–34.0)
MCHC: 32.1 g/dL (ref 30.0–36.0)
MCV: 84.9 fL (ref 80.0–100.0)
Platelets: 320 10*3/uL (ref 150–400)
RBC: 3.38 MIL/uL — ABNORMAL LOW (ref 4.22–5.81)
RDW: 19.9 % — ABNORMAL HIGH (ref 11.5–15.5)
WBC: 9.2 10*3/uL (ref 4.0–10.5)
nRBC: 0 % (ref 0.0–0.2)

## 2022-03-23 LAB — BASIC METABOLIC PANEL
Anion gap: 12 (ref 5–15)
BUN: 39 mg/dL — ABNORMAL HIGH (ref 6–20)
CO2: 21 mmol/L — ABNORMAL LOW (ref 22–32)
Calcium: 8.9 mg/dL (ref 8.9–10.3)
Chloride: 96 mmol/L — ABNORMAL LOW (ref 98–111)
Creatinine, Ser: 2.46 mg/dL — ABNORMAL HIGH (ref 0.61–1.24)
GFR, Estimated: 33 mL/min — ABNORMAL LOW (ref >60)
Glucose, Bld: 133 mg/dL — ABNORMAL HIGH (ref 70–99)
Potassium: 3.2 mmol/L — ABNORMAL LOW (ref 3.5–5.1)
Sodium: 129 mmol/L — ABNORMAL LOW (ref 135–145)

## 2022-03-23 LAB — COOXEMETRY PANEL
Carboxyhemoglobin: 1.8 % — ABNORMAL HIGH (ref 0.5–1.5)
Methemoglobin: <0.7 % (ref 0.0–1.5)
O2 Saturation: 63.2 %
Total hemoglobin: 11.6 g/dL — ABNORMAL LOW (ref 12.0–16.0)

## 2022-03-23 LAB — GLUCOSE, CAPILLARY
Glucose-Capillary: 174 mg/dL — ABNORMAL HIGH (ref 70–99)
Glucose-Capillary: 114 mg/dL — ABNORMAL HIGH (ref 70–99)
Glucose-Capillary: 115 mg/dL — ABNORMAL HIGH (ref 70–99)
Glucose-Capillary: 196 mg/dL — ABNORMAL HIGH (ref 70–99)

## 2022-03-23 MED ORDER — APIXABAN 5 MG PO TABS
5.0000 mg | ORAL_TABLET | Freq: Two times a day (BID) | ORAL | 2 refills | Status: DC
  Filled 2022-03-23: qty 60, 30d supply, fill #0

## 2022-03-23 MED ORDER — TEMAZEPAM 30 MG PO CAPS
30.0000 mg | ORAL_CAPSULE | Freq: Every evening | ORAL | 0 refills | Status: DC | PRN
  Filled 2022-03-23: qty 30, 30d supply, fill #0

## 2022-03-23 MED ORDER — POTASSIUM CHLORIDE CRYS ER 20 MEQ PO TBCR
40.0000 meq | EXTENDED_RELEASE_TABLET | ORAL | Status: DC
  Administered 2022-03-23: 40 meq via ORAL
  Filled 2022-03-23: qty 2

## 2022-03-23 MED ORDER — POTASSIUM CHLORIDE CRYS ER 20 MEQ PO TBCR: 40.0000 meq | EXTENDED_RELEASE_TABLET | ORAL | Status: DC

## 2022-03-23 MED ORDER — POTASSIUM CHLORIDE CRYS ER 20 MEQ PO TBCR
20.0000 meq | EXTENDED_RELEASE_TABLET | Freq: Every day | ORAL | 11 refills | Status: DC
  Filled 2022-03-23: qty 30, 30d supply, fill #0

## 2022-03-23 MED ORDER — POTASSIUM CHLORIDE CRYS ER 20 MEQ PO TBCR
20.0000 meq | EXTENDED_RELEASE_TABLET | Freq: Once | ORAL | Status: AC
Start: 2022-03-23 — End: 2022-03-23
  Administered 2022-03-23: 20 meq via ORAL
  Filled 2022-03-23: qty 1

## 2022-03-23 MED ORDER — SPIRONOLACTONE 12.5 MG HALF TABLET
12.5000 mg | ORAL_TABLET | Freq: Every day | ORAL | Status: DC
Start: 2022-03-23 — End: 2022-03-23
  Administered 2022-03-23: 12.5 mg via ORAL
  Filled 2022-03-23: qty 1

## 2022-03-23 MED ORDER — TORSEMIDE 20 MG PO TABS
40.0000 mg | ORAL_TABLET | Freq: Every day | ORAL | 0 refills | Status: DC
  Filled 2022-03-23: qty 60, 30d supply, fill #0

## 2022-03-23 MED ORDER — AMIODARONE HCL 200 MG PO TABS
200.0000 mg | ORAL_TABLET | Freq: Every day | ORAL | 11 refills | Status: DC
  Filled 2022-03-23 (×2): qty 30, 30d supply, fill #0

## 2022-03-23 MED ORDER — TORSEMIDE 20 MG PO TABS
40.0000 mg | ORAL_TABLET | Freq: Every day | ORAL | 0 refills | Status: DC
  Filled 2022-03-23: qty 70, 35d supply, fill #0

## 2022-03-23 MED ORDER — SPIRONOLACTONE 25 MG PO TABS
12.5000 mg | ORAL_TABLET | Freq: Every day | ORAL | 11 refills | Status: DC
  Filled 2022-03-23: qty 15, 30d supply, fill #0

## 2022-03-23 NOTE — Plan of Care (Signed)
  Problem: Activity: Goal: Ability to tolerate increased activity will improve Outcome: Progressing   Problem: Respiratory: Goal: Ability to maintain a clear airway and adequate ventilation will improve Outcome: Progressing   Problem: Role Relationship: Goal: Method of communication will improve Outcome: Progressing   Problem: Education: Goal: Understanding of CV disease, CV risk reduction, and recovery process will improve Outcome: Progressing Goal: Individualized Educational Video(s) Outcome: Progressing   Problem: Activity: Goal: Ability to return to baseline activity level will improve Outcome: Progressing   Problem: Cardiovascular: Goal: Ability to achieve and maintain adequate cardiovascular perfusion will improve Outcome: Progressing Goal: Vascular access site(s) Level 0-1 will be maintained Outcome: Progressing   Problem: Health Behavior/Discharge Planning: Goal: Ability to safely manage health-related needs after discharge will improve Outcome: Progressing   Problem: Education: Goal: Knowledge of General Education information will improve Description: Including pain rating scale, medication(s)/side effects and non-pharmacologic comfort measures Outcome: Progressing   Problem: Health Behavior/Discharge Planning: Goal: Ability to manage health-related needs will improve Outcome: Progressing   Problem: Clinical Measurements: Goal: Ability to maintain clinical measurements within normal limits will improve Outcome: Progressing Goal: Will remain free from infection Outcome: Progressing Goal: Diagnostic test results will improve Outcome: Progressing Goal: Respiratory complications will improve Outcome: Progressing Goal: Cardiovascular complication will be avoided Outcome: Progressing   Problem: Activity: Goal: Risk for activity intolerance will decrease Outcome: Progressing   Problem: Nutrition: Goal: Adequate nutrition will be maintained Outcome:  Progressing   Problem: Coping: Goal: Level of anxiety will decrease Outcome: Progressing   Problem: Elimination: Goal: Will not experience complications related to bowel motility Outcome: Progressing Goal: Will not experience complications related to urinary retention Outcome: Progressing   Problem: Pain Managment: Goal: General experience of comfort will improve Outcome: Progressing   Problem: Safety: Goal: Ability to remain free from injury will improve Outcome: Progressing   Problem: Skin Integrity: Goal: Risk for impaired skin integrity will decrease Outcome: Progressing   Problem: Education: Goal: Ability to describe self-care measures that may prevent or decrease complications (Diabetes Survival Skills Education) will improve Outcome: Progressing Goal: Individualized Educational Video(s) Outcome: Progressing   Problem: Coping: Goal: Ability to adjust to condition or change in health will improve Outcome: Progressing   Problem: Fluid Volume: Goal: Ability to maintain a balanced intake and output will improve Outcome: Progressing   Problem: Health Behavior/Discharge Planning: Goal: Ability to identify and utilize available resources and services will improve Outcome: Progressing Goal: Ability to manage health-related needs will improve Outcome: Progressing   Problem: Metabolic: Goal: Ability to maintain appropriate glucose levels will improve Outcome: Progressing   Problem: Nutritional: Goal: Maintenance of adequate nutrition will improve Outcome: Progressing Goal: Progress toward achieving an optimal weight will improve Outcome: Progressing   Problem: Skin Integrity: Goal: Risk for impaired skin integrity will decrease Outcome: Progressing   Problem: Tissue Perfusion: Goal: Adequacy of tissue perfusion will improve Outcome: Progressing

## 2022-03-23 NOTE — Progress Notes (Signed)
Mobility Specialist Progress Note    03/23/22 1016  Mobility  Activity Ambulated independently in hallway  Level of Assistance Independent  Assistive Device None  Distance Ambulated (ft) 800 ft  Activity Response Tolerated well  $Mobility charge 1 Mobility   Pre-Mobility: 99 HR During Mobility: 108 HR Post-Mobility: 104 HR  Pt received in chair and agreeable. No complaints on walk. Returned to chair with call bell in reach.    Hildred Alamin Mobility Specialist

## 2022-03-23 NOTE — Discharge Summary (Addendum)
Advanced Heart Failure Team  Discharge Summary   Patient ID: [name] [name] MRN: [id number], DOB/AGE: Apr 15, 1981 41 y.o. Admit date: 03/13/2022 D/C date:     03/23/2022   Primary Discharge Diagnoses:  Acute on chronic biventricular HF Cardiogenic shock Acute hypoxic respiratory failure Afib with RVR  Secondary Discharge Diagnoses:  S/p AVR AKI on CKD IIIb Possible PNA Hyponatremia Hypokalemia Insomnia  Hospital Course: 41 y.o. male with history of severe biventricular CHF/NICM, severe aortic valve insufficiency s/p repair in 2018, CKD, atrial fibrillation.   He was admitted for a/c CHF in 07/23 and discharge home on milrinone after failing inotrope wean. He was seen for f/u shortly after and direct admitted on 02/19/22 d/t concern for cardiogenic shock.  D/t refractory shock he had RHC which showed severe biventricular failure with markedly elevated volume status and low-output. SWAN and IABP placed. Course further c/b respiratory failure requiring mechanical ventilation and afib with RVR. He was transferred to [facility] on 02/20/22 for high risk AVR  He underwent SAVR with 27 mm Inspiris valve at [facility] on 02/22/22. He had afib post-op and had unsuccessful attempt at DCCV.  Rate control strategy was opted. Home milrinone continued at discharge and LifeVest placed. He had been discharged on Torsemide 40 BID, spiro 12.5 daily, Eliquis 5 BID and amiodarone.   He was transferred to [facility] from [place] ED on 03/13/22 with acute respiratory failure with hypoxia secondary to a/c CHF and cardiogenic shock. He was emergently intubated. Found to be in AF with RVR and had emergent DCCV 09/01. Apparently had been unable to get medications filled from pharmacy after hospital discharge and had missed several doses.   He was continued on home milrinone and required additional support with NE. Patient diuresed with IV lasix and metolazone and was extubated on 09/04. NE weaned off and CO-OX remained stable with 0.5 mcg  milrinone (home dose). He had recurrent AF despite amiodarone gtt and underwent repeat DCCV 09/03 and 09/06. Course further c/b AKI on CKD and possible PNA treated with course of abx.   He will be discharged home on IV milrinone 0.5 mcg. Infusion will be managed by [facility].   Patient will follow closely in [facility] HF clinic and [facility] for consideration of transplant.   Please see below for hospital course by problem.  Hospital Course by Problem: 1. A/C Systolic Biventricular HF-->Cardiogenic Shock - Echo this admit - LVEF 5-10%  RV severely reduced. IVC dilated. AVR ok - On home milrinone 0.5 mcg with stable CO-OX (continue at discharge). Off NE.  - Will resume wearing LifeVest (discharged from [facility] with vest last month) - CVP 12-13 today. Volume likely as good as will get with RV failure.  - Continue po Torsemide 40 mg daily + an extra 40 mg PRN - Add spiro 12.5 mg daily  - GDMT limited by renal fx  - Will need OHTx +/- kidney in near future   2. Acute Hypoxic Respiratory Failure - In setting of a/c CHF - Extubated 9/4 - stable on RA    3. A fib RVR -Had DC-CV 02/26/22--> SR -Emergent DC-CV 03/13/22 ->SR -Repeat DCCV 9/3>>NSR  -Back in Afib/AFL >>DCCV 9/6>>NSR  -Remains in NSR today -Continue po amiodarone.  -Continue apixaban.    4. AkI on CKD Stage IIIb - due to ATN/shock - Scr has been quite variable in recent months, currently low to mid 2s - SCr this admit 2.2 > 2.4 > 2.1 > 2.5 >2.7 >  2.58 >2.71 >3.0 >2.79 >3 >2.55 > 2.46 - continue hemodynamic support with milrinone.  - daily BMET   5. S/P AVR  -H/O AV repair 2018 with complex valvuloplasty including plication of left coronary leaflet and aortic ring annuloplasty -AVR with 27 mm Inspiris [facility] 02/2022  -Repeat Echo this admit- valve ok.    6. ? PNA vs Atelectasis  - + productive cough, CXR suggestive of possible RLL PNA vs Axt - PCT 1.35>>0.99 but also in the setting of renal  dysfunction  - treated with course of empiric abx   7. Hyponatremia - Na 131>130>123>tolvaptan>131>133>129  - Restrict free water   8. Insomnia - Improved.  - Provided short supply of Restoril to use PRN at discharge   9. Hypokalemia - supp'd with diuretics - potassium supplement prescribed at discharge   Discharge Weight Range: 210-179 lb Discharge Vitals: Blood pressure (!) 121/97, pulse 93, temperature 97.8 F (36.6 C), temperature source Oral, resp. rate (!) 32, height '5\' 10"'$  (1.778 m), weight 81.1 kg, SpO2 96 %.  Labs: Lab Results  Component Value Date   WBC 9.2 03/23/2022   HGB 9.2 (L) 03/23/2022   HCT 28.7 (L) 03/23/2022   MCV 84.9 03/23/2022   PLT 320 03/23/2022    Recent Labs  Lab 03/21/22 0330 03/22/22 0653 03/23/22 0342  NA 131*   < > 129*  K 3.8   < > 3.2*  CL 91*   < > 96*  CO2 24   < > 21*  BUN 42*   < > 39*  CREATININE 3.00*   < > 2.46*  CALCIUM 9.4   < > 8.9  PROT 6.6  --   --   BILITOT 1.0  --   --   ALKPHOS 73  --   --   ALT 27  --   --   AST 19  --   --   GLUCOSE 100*   < > 133*   < > = values in this interval not displayed.   Lab Results  Component Value Date   CHOL 118 02/09/2022   HDL 24 (L) 02/09/2022   LDLCALC 75 02/09/2022   TRIG 94 02/09/2022   BNP (last 3 results) Recent Labs    02/19/22 1002 03/13/22 1007 03/13/22 1410  BNP 3,100.6* 2,469.0* 3,264.6*    ProBNP (last 3 results) No results for input(s): "PROBNP" in the last 8760 hours.   Diagnostic Studies/Procedures   Echo 03/13/22: IMPRESSIONS:  1. Left ventricular ejection fraction, by estimation, is <20%. The left ventricle has severely decreased function. The left ventricle demonstrates global hypokinesis. The left ventricular internal cavity size was severely dilated. There is mild left  ventricular hypertrophy. Left ventricular diastolic parameters are consistent with Grade III diastolic dysfunction (restrictive).   2. Right ventricular systolic function is  severely reduced. The right ventricular size is normal. There is normal pulmonary artery systolic pressure. The estimated right ventricular systolic pressure is 24.4 mmHg.   3. Left atrial size was mildly dilated.   4. Right atrial size was mildly dilated.   5. The mitral valve is normal in structure. Mild to moderate mitral valve regurgitation. No evidence of mitral stenosis.   6. Bioprosthetic aortic valve. Mean gradient 6 mmHg with no significant perivalvular leakage.   7. Aortic dilatation noted. There is mild dilatation of the ascending aorta, measuring 43 mm.   8. The inferior vena cava is dilated in size with <50% respiratory variability, suggesting right atrial pressure of 15 mmHg.  Discharge Medications   Allergies as of 03/23/2022   No Known Allergies      Medication List     STOP taking these medications    ALPRAZolam 0.5 MG tablet Commonly known as: XANAX   oxyCODONE 5 MG immediate release tablet Commonly known as: Oxy IR/ROXICODONE       TAKE these medications    allopurinol 300 MG tablet Commonly known as: ZYLOPRIM Take 300 mg by mouth daily.   amiodarone 200 MG tablet Commonly known as: PACERONE Take 1 tablet (200 mg total) by mouth daily.   apixaban 5 MG Tabs tablet Commonly known as: ELIQUIS Take 1 tablet (5 mg total) by mouth 2 (two) times daily. What changed: when to take this   aspirin EC 81 MG tablet Take 81 mg by mouth daily. Swallow whole.   milrinone 20 MG/100 ML Soln infusion Commonly known as: PRIMACOR Inject 0.0486 mg/min into the vein continuous.   polyethylene glycol 17 g packet Commonly known as: MIRALAX / GLYCOLAX Take 17 g by mouth daily as needed for mild constipation.   potassium chloride SA 20 MEQ tablet Commonly known as: KLOR-CON M Take 1 tablet (20 mEq total) by mouth daily. What changed:  medication strength how much to take   spironolactone 25 MG tablet Commonly known as: ALDACTONE Take 0.5 tablets (12.5 mg total)  by mouth daily.   temazepam 30 MG capsule Commonly known as: RESTORIL Take 1 capsule (30 mg total) by mouth at bedtime as needed for sleep.   torsemide 20 MG tablet Commonly known as: DEMADEX Take 2 tablets (40 mg total) by mouth daily. Take another 2 tablets (40 mg) in the evening if you experience >3lbs weight gain in 1 week What changed:  when to take this additional instructions               Durable Medical Equipment  (From admission, onward)           Start     Ordered   03/20/22 1510  Heart failure home health orders  (Heart failure home health orders / Face to face)  Once       Comments: Heart Failure Follow-up Care:  Verify follow-up appointments per Patient Discharge Instructions. Confirm transportation arranged. Reconcile home medications with discharge medication list. Remove discontinued medications from use. Assist patient/caregiver to manage medications using pill box. Reinforce low sodium food selection Assessments: Vital signs and oxygen saturation at each visit. Assess home environment for safety concerns, caregiver support and availability of low-sodium foods. Consult Education officer, museum, PT/OT, Dietitian, and CNA based on assessments. Perform comprehensive cardiopulmonary assessment. Notify MD for any change in condition or weight gain of 3 pounds in one day or 5 pounds in one week with symptoms. Daily Weights and Symptom Monitoring: Ensure patient has access to scales. Teach patient/caregiver to weigh daily before breakfast and after voiding using same scale and record.    Teach patient/caregiver to track weight and symptoms and when to notify Provider. Activity: Develop individualized activity plan with patient/caregiver.  NYHA class IV heart failure. Resume home inotrope.  [facility] to provide  Labs every other week to include BMET, Mg, and CBC with Diff. Additional as needed. Should be drawn via PERIPHERAL stick. NOT PICC line.   Z6109 Milrinone 0.5  mcg/kg/min X 52 weeks A4221 Supplies for maintenance of drug infusion catheter A4222 Supplies for the external drug infusion per cassette or bag E0781 Ambulatory Infusion pump  Question Answer Comment  Heart Failure Follow-up Care Advanced  Heart Failure (AHF) Clinic at [place] Visits Set up telemonitoring equipment to monitor daily vital signs, weights and oxygen saturation   Obtain the following labs Basic Metabolic Panel   Lab frequency Other see comments   Fax lab results to AHF Clinic at [phone]   Diet Low Sodium Heart Healthy   Fluid restrictions: 1500 mL Fluid      03/20/22 1511            Disposition   The patient will be discharged in stable condition to home. Discharge Instructions     (HEART FAILURE PATIENTS) Call MD:  Anytime you have any of the following symptoms: 1) 3 pound weight gain in 24 hours or 5 pounds in 1 week 2) shortness of breath, with or without a dry hacking cough 3) swelling in the hands, feet or stomach 4) if you have to sleep on extra pillows at night in order to breathe.   Complete by: As directed    Diet - low sodium heart healthy   Complete by: As directed    Increase activity slowly   Complete by: As directed    STOP any activity that causes chest pain, shortness of breath, dizziness, sweating, or exessive weakness   Complete by: As directed        Follow-up Information     [facility] [place] Follow up on 03/25/2022.   Specialty: Cardiology Why: Advanced Heart Failure Clinic 3:20 pm Entrance C, Free Valet Parking Contact information: [address] [phone] mc [place] Follow up.   Why: IV Milrinone- Registered Nurse.                  Duration of Discharge Encounter: Greater than 35 minutes   Signed, [facility], LINDSAY N  03/23/2022, 2:48 PM   Patient seen and examined with the  above-signed Advanced Practice Provider and/or Housestaff. I personally reviewed laboratory data, imaging studies and relevant notes. I independently examined the patient and formulated the important aspects of the plan. I have edited the note to reflect any of my changes or salient points. I have personally discussed the plan with the patient and/or family.  Much improved but remains tenuous. OK for d/c. Will need close f/u with [place] [facility].   See rounding note from today for further details.   Glori Bickers, MD  4:21 PM

## 2022-03-23 NOTE — Plan of Care (Signed)
  Problem: Activity: Goal: Ability to return to baseline activity level will improve Outcome: Progressing   Problem: Cardiovascular: Goal: Vascular access site(s) Level 0-1 will be maintained Outcome: Progressing   Problem: Health Behavior/Discharge Planning: Goal: Ability to safely manage health-related needs after discharge will improve Outcome: Progressing   Problem: Clinical Measurements: Goal: Will remain free from infection Outcome: Progressing   Problem: Nutrition: Goal: Adequate nutrition will be maintained Outcome: Progressing   Problem: Pain Managment: Goal: General experience of comfort will improve Outcome: Progressing   Problem: Safety: Goal: Ability to remain free from injury will improve Outcome: Progressing

## 2022-03-23 NOTE — Progress Notes (Signed)
Patient ID: [name] [name], male   DOB: 13-Aug-1980, 40 y.o.   MRN: [id number]   Advanced Heart Failure Rounding Note   Subjective:    9/4 extubated. Stable on RA  9/5 went back into Afib/AFL w/ RVR, rebolused w/ amio. NE discontinued. Lasix gtt reduced to 10/hr   9/6 beside DCCV>>NSR. Abx started for possible PNA. PCT 1.35. Lasix gtt discontinued.   Feels good. Denies CP or SOB. Remains in NSR on po amio   Co-ox 63% CVP 12-13    Objective:    Vital Signs:   Temp:  [97.8 F (36.6 C)-98.2 F (36.8 C)] 97.8 F (36.6 C) (09/11 0714) Resp:  [18-25] 18 (09/11 0920) BP: (107-140)/(80-117) 127/96 (09/11 0920) SpO2:  [97 %] 97 % (09/11 0714) Weight:  [81.1 kg] 81.1 kg (09/11 0550) Last BM Date : 03/20/22  Weight change: Filed Weights   03/21/22 0510 03/22/22 0500 03/23/22 0550  Weight: 81.1 kg 80.4 kg 81.1 kg    Intake/Output:   Intake/Output Summary (Last 24 hours) at 03/23/2022 1013 Last data filed at 03/23/2022 0830 Gross per 24 hour  Intake 545 ml  Output 2100 ml  Net -1555 ml     PHYSICAL EXAM: General:  Well appearing. No resp difficulty HEENT: normal Neck: supple. no JVD. Carotids 2+ bilat; no bruits. No lymphadenopathy or thryomegaly appreciated. Cor: PMI nondisplaced. Regular tachy + RV lift. No rubs, gallops or murmurs. Lungs: clear Abdomen: soft, nontender, nondistended. No hepatosplenomegaly. No bruits or masses. Good bowel sounds. Extremities: no cyanosis, clubbing, rash, edema Neuro: alert & orientedx3, cranial nerves grossly intact. moves all 4 extremities w/o difficulty. Affect pleasant  Telemetry: SR 90-100 (personally reviewed)  Labs: Basic Metabolic Panel: Recent Labs  Lab 03/17/22 0530 03/18/22 0408 03/19/22 0411 03/20/22 0436 03/20/22 1809 03/21/22 0330 03/21/22 1600 03/22/22 0653 03/23/22 0342  NA 131* 131*   < > 123* 129* 131*  --  133* 129*  K 3.3* 3.5   < > 3.4* 4.7 3.8  --  4.0 3.2*  CL 85* 87*   < > 87* 93* 91*  --  97* 96*   CO2 29 26   < > 23 21* 24  --  25 21*  GLUCOSE 136* 115*   < > 210* 156* 100*  --  131* 133*  BUN 36* 32*   < > 39* 44* 42*  --  36* 39*  CREATININE 2.58* 2.71*   < > 2.79* 3.02* 3.00*  --  2.55* 2.46*  CALCIUM 9.0 9.1   < > 8.7* 9.0 9.4  --  9.2 8.9  MG 2.0 1.9  --   --   --   --  2.2  --   --    < > = values in this interval not displayed.     Liver Function Tests: Recent Labs  Lab 03/21/22 0330  AST 19  ALT 27  ALKPHOS 73  BILITOT 1.0  PROT 6.6  ALBUMIN 2.9*    No results for input(s): "LIPASE", "AMYLASE" in the last 168 hours. No results for input(s): "AMMONIA" in the last 168 hours.  CBC: Recent Labs  Lab 03/19/22 0411 03/20/22 0436 03/21/22 0330 03/22/22 0653 03/23/22 0342  WBC 8.5 7.6 8.5 7.8 9.2  HGB 10.1* 9.7* 10.2* 10.0* 9.2*  HCT 30.3* 29.6* 30.9* 30.0* 28.7*  MCV 83.2 82.7 82.6 82.2 84.9  PLT 239 222 285 294 320     Cardiac Enzymes: No results for input(s): "CKTOTAL", "CKMB", "CKMBINDEX", "TROPONINI" in the last  168 hours.  BNP: BNP (last 3 results) Recent Labs    02/19/22 1002 03/13/22 1007 03/13/22 1410  BNP 3,100.6* 2,469.0* 3,264.6*     ProBNP (last 3 results) No results for input(s): "PROBNP" in the last 8760 hours.    Other results:  Imaging: No results found.   Medications:     Scheduled Medications:  amiodarone  200 mg Oral BID   apixaban  5 mg Oral BID   aspirin  81 mg Oral Daily   Chlorhexidine Gluconate Cloth  6 each Topical Daily   feeding supplement  237 mL Oral BID BM   insulin aspart  0-15 Units Subcutaneous TID AC & HS   insulin glargine-yfgn  5 Units Subcutaneous Daily   melatonin  10 mg Oral Q24 Hr x 5   polyethylene glycol  17 g Oral Daily   senna  1 tablet Oral Daily   sodium chloride flush  10-40 mL Intracatheter Q12H   sodium chloride flush  3 mL Intravenous Q12H   spironolactone  12.5 mg Oral Daily   torsemide  40 mg Oral Daily    Infusions:  sodium chloride     sodium chloride Stopped  (03/15/22 1704)   ceFEPime (MAXIPIME) IV 2 g (03/23/22 0332)   milrinone 0.5 mcg/kg/min (03/23/22 0330)    PRN Medications: sodium chloride, Place/Maintain arterial line **AND** sodium chloride, acetaminophen, ondansetron (ZOFRAN) IV, mouth rinse, sodium chloride flush, sodium chloride flush, temazepam   Assessment/Plan:   1. A/C Systolic Biventricular HF-->Cardiogenic Shock - Repeat ECHO - LVEF 5-10%  RV severely reduced. IVC dilated. AVR ok - On home milrinone 0.5 mcg. Off NE. Pending co-ox today but has been stable.  - CVP 12 on po torsemide. Will add spiro.  - GDMT limited by renal fx  - Will need OHTx +/- kidney in near future   2. Acute Hypoxic Respiratory Failure - Extubated 9/4 - stable on RA   3. A fib RVR -Had DC-CV 02/26/22--> SR -Emergent DC-CV 03/13/22 ->SR -Repeat DCCV 9/3>>NSR  -Back in Afib/AFL >>DCCV 9/6>>NSR  -Remains in NSR today -Continue po amiodarone.  -Continue apixaban.  -Supp lytes, keep K > 4.0 and Mg > 2.0. d/w pharmacy    4. AkI on CKD Stage IIIb - SCr 2.2 -> 2.4 -> 2.1 -> 2.5 ->2.7->2.58->2.71->3.0->2.79->3->2.55 > 2.46 - due to ATN/shock - continue hemodynamic support with milrinone.  - daily BMET   5. S/P AVR  -H/O AV repair 2018 with complex valvuloplasty including plication of left coronary leaflet and aortic ring annuloplasty -AVR [facility] 02/2022  -Repeat Echo - valve ok.   6. ? PNA vs Atelectasis  - + productive cough, CXR suggestive of possible RLL PNA vs Axt - PCT 1.35>>0.99 but also in the setting of renal dysfunction  - continue empiric abx coverage w/ cefepime per CCM  - encouraged use of IS   7. Hyponatremia - Na 131>130>123>tolvaptan>131>133> 129  - Restrict free water  8. Insomnia - Improved.   9. Hypokalemia - supp  Home today on current meds including IV milrinone  Add spiro 12.5 Continue torsemide 40 daily. If weight up 3 pounds or more increase to 40 bid.   F/u next week. I updated [facility] team.  Glori Bickers, MD  10:16 AM    Continue to mobilize, hopefully home Monday.    Length of Stay: [place]  03/23/2022, 10:13 AM  Advanced Heart Failure Team Pager [phone] (M-F; 7a - 4p)  Please contact [facility]  for night-coverage after hours (4p -7a ) and weekends on [email or web address]

## 2022-03-23 NOTE — Progress Notes (Signed)
RN provided patient with verbal discharge instructions. Paper copy of discharge summary provided to patient. RN answered all questions. VSS at discharge. IV's removed. Pt belongings sent with patient at discharge.  Pt to home with PICC. Carolynn Sayers at bedside to setup home milrinone. Pt belongings sent with patient at discharge. MD note provided to patient.  NT d/c patient via wheelchair to private vehicle.

## 2022-03-23 NOTE — TOC Transition Note (Signed)
Transition of Care [facility]) - CM/SW Discharge Note   Patient Details  Name: [name] [name] MRN: [id number] Date of Birth: 08-Sep-1980  [facility] CM/SW Contact:  Zenon Mayo, RN Phone Number: 03/23/2022, 11:27 AM   Clinical Narrative:    Patient is for dc today, NCM spoke with Carolynn Sayers, she states this NCM does not need to do anything, patient will be hooked up with milrinone between 3 and 4 and he should be good to go after that, Caryl Pina with [facility]  has been notified by Jeannene Patella that he is for dc today.   Final next level of care: [place] Barriers to Discharge: Continued Medical Work up   Patient Goals and CMS Choice Patient states their goals for this hospitalization and ongoing recovery are:: plan to go home on IV Milrinone.      Discharge Placement                       Discharge Plan and Services In-house Referral: NA Discharge Planning Services: CM Consult Post Acute Care Choice: Resumption of Svcs/PTA Provider, Home Health                    HH Arranged: RN (IV Milrinone gtt) [place] Agency: [place] ([facility]) Date [place]: 03/20/22 Time [place]: 1527 Representative spoke with at [place]: Caryl Pina and Jeannene Patella with [facility]  Social Determinants of Health ([place]) Interventions     Readmission Risk Interventions    03/20/2022    3:24 PM  Readmission Risk Prevention Plan  Transportation Screening Complete  PCP or Specialist Appt within 3-5 Days Complete  HRI or [place] Complete  Social Work Consult for [place] Planning/Counseling Complete  Palliative Care Screening Not Applicable  Medication Review Press photographer) Referral to Pharmacy

## 2022-03-23 NOTE — Plan of Care (Signed)
Problem: Education: Goal: Understanding of CV disease, CV risk reduction, and recovery process will improve Outcome: Adequate for Discharge Goal: Individualized Educational Video(s) Outcome: Adequate for Discharge   Problem: Activity: Goal: Ability to return to baseline activity level will improve 03/23/2022 1622 by [name] Manly, RN Outcome: Adequate for Discharge 03/23/2022 1126 by [name] Manly, RN Outcome: Progressing   Problem: Cardiovascular: Goal: Ability to achieve and maintain adequate cardiovascular perfusion will improve Outcome: Adequate for Discharge Goal: Vascular access site(s) Level 0-1 will be maintained 03/23/2022 1622 by [name] Manly, RN Outcome: Adequate for Discharge 03/23/2022 1126 by [name] Manly, RN Outcome: Progressing   Problem: Health Behavior/Discharge Planning: Goal: Ability to safely manage health-related needs after discharge will improve 03/23/2022 1622 by [name] Manly, RN Outcome: Adequate for Discharge 03/23/2022 1126 by [name] Manly, RN Outcome: Progressing   Problem: Education: Goal: Knowledge of General Education information will improve Description: Including pain rating scale, medication(s)/side effects and non-pharmacologic comfort measures Outcome: Adequate for Discharge   Problem: Health Behavior/Discharge Planning: Goal: Ability to manage health-related needs will improve Outcome: Adequate for Discharge   Problem: Clinical Measurements: Goal: Ability to maintain clinical measurements within normal limits will improve Outcome: Adequate for Discharge Goal: Will remain free from infection 03/23/2022 1622 by [name] Manly, RN Outcome: Adequate for Discharge 03/23/2022 1126 by [name] Manly, RN Outcome: Progressing Goal: Diagnostic test results will improve Outcome: Adequate for Discharge Goal: Respiratory complications will improve Outcome: Adequate for  Discharge Goal: Cardiovascular complication will be avoided Outcome: Adequate for Discharge   Problem: Activity: Goal: Risk for activity intolerance will decrease Outcome: Adequate for Discharge   Problem: Nutrition: Goal: Adequate nutrition will be maintained 03/23/2022 1622 by [name] Manly, RN Outcome: Adequate for Discharge 03/23/2022 1126 by [name] Manly, RN Outcome: Progressing   Problem: Coping: Goal: Level of anxiety will decrease Outcome: Adequate for Discharge   Problem: Elimination: Goal: Will not experience complications related to bowel motility Outcome: Adequate for Discharge Goal: Will not experience complications related to urinary retention Outcome: Adequate for Discharge   Problem: Pain Managment: Goal: General experience of comfort will improve 03/23/2022 1622 by [name] Manly, RN Outcome: Adequate for Discharge 03/23/2022 1126 by [name] Manly, RN Outcome: Progressing   Problem: Safety: Goal: Ability to remain free from injury will improve 03/23/2022 1622 by [name] Manly, RN Outcome: Adequate for Discharge 03/23/2022 1126 by [name] Manly, RN Outcome: Progressing   Problem: Skin Integrity: Goal: Risk for impaired skin integrity will decrease Outcome: Adequate for Discharge   Problem: Education: Goal: Ability to describe self-care measures that may prevent or decrease complications (Diabetes Survival Skills Education) will improve Outcome: Adequate for Discharge Goal: Individualized Educational Video(s) Outcome: Adequate for Discharge   Problem: Coping: Goal: Ability to adjust to condition or change in health will improve Outcome: Adequate for Discharge   Problem: Fluid Volume: Goal: Ability to maintain a balanced intake and output will improve Outcome: Adequate for Discharge   Problem: Health Behavior/Discharge Planning: Goal: Ability to identify and utilize available resources and services will  improve Outcome: Adequate for Discharge Goal: Ability to manage health-related needs will improve Outcome: Adequate for Discharge   Problem: Metabolic: Goal: Ability to maintain appropriate glucose levels will improve Outcome: Adequate for Discharge   Problem: Nutritional: Goal: Maintenance of adequate nutrition will improve Outcome: Adequate for Discharge Goal: Progress toward achieving an optimal weight will improve Outcome: Adequate for Discharge   Problem: Skin Integrity: Goal: Risk for  impaired skin integrity will decrease Outcome: Adequate for Discharge   Problem: Tissue Perfusion: Goal: Adequacy of tissue perfusion will improve Outcome: Adequate for Discharge   Problem: Inadequate Protein-Energy Intake  (NI-5.3) Goal: Food and/or nutrient delivery Description: Individualized approach for food/nutrient provision. Outcome: Adequate for Discharge   Problem: Education: Goal: Ability to demonstrate management of disease process will improve Outcome: Adequate for Discharge Goal: Ability to verbalize understanding of medication therapies will improve Outcome: Adequate for Discharge Goal: Individualized Educational Video(s) Outcome: Adequate for Discharge   Problem: Activity: Goal: Capacity to carry out activities will improve Outcome: Adequate for Discharge   Problem: Cardiac: Goal: Ability to achieve and maintain adequate cardiopulmonary perfusion will improve Outcome: Adequate for Discharge

## 2022-03-24 DIAGNOSIS — I509 Heart failure, unspecified: Secondary | ICD-10-CM | POA: Diagnosis not present

## 2022-03-25 ENCOUNTER — Encounter (HOSPITAL_COMMUNITY): Payer: Self-pay | Admitting: Internal Medicine

## 2022-03-25 ENCOUNTER — Ambulatory Visit (HOSPITAL_COMMUNITY)
Admit: 2022-03-25 | Discharge: 2022-03-25 | Disposition: A | Discharge status: Home / Self Care | Payer: BC Managed Care – PPO | Attending: Internal Medicine | Admitting: Internal Medicine

## 2022-03-25 VITALS — BP 120/80 | HR 100 | Wt 180.8 lb

## 2022-03-25 DIAGNOSIS — I130 Hypertensive heart and chronic kidney disease with heart failure and stage 1 through stage 4 chronic kidney disease, or unspecified chronic kidney disease: Secondary | ICD-10-CM | POA: Diagnosis not present

## 2022-03-25 DIAGNOSIS — Z79899 Other long term (current) drug therapy: Secondary | ICD-10-CM | POA: Insufficient documentation

## 2022-03-25 DIAGNOSIS — E871 Hypo-osmolality and hyponatremia: Secondary | ICD-10-CM | POA: Diagnosis not present

## 2022-03-25 DIAGNOSIS — N184 Chronic kidney disease, stage 4 (severe): Secondary | ICD-10-CM | POA: Insufficient documentation

## 2022-03-25 DIAGNOSIS — Z7901 Long term (current) use of anticoagulants: Secondary | ICD-10-CM | POA: Diagnosis not present

## 2022-03-25 DIAGNOSIS — Z952 Presence of prosthetic heart valve: Secondary | ICD-10-CM | POA: Diagnosis not present

## 2022-03-25 DIAGNOSIS — I480 Paroxysmal atrial fibrillation: Secondary | ICD-10-CM | POA: Diagnosis not present

## 2022-03-25 DIAGNOSIS — I080 Rheumatic disorders of both mitral and aortic valves: Secondary | ICD-10-CM | POA: Diagnosis not present

## 2022-03-25 DIAGNOSIS — I5082 Biventricular heart failure: Secondary | ICD-10-CM | POA: Diagnosis not present

## 2022-03-25 DIAGNOSIS — I5022 Chronic systolic (congestive) heart failure: Secondary | ICD-10-CM | POA: Diagnosis not present

## 2022-03-25 DIAGNOSIS — R570 Cardiogenic shock: Secondary | ICD-10-CM | POA: Diagnosis not present

## 2022-03-25 DIAGNOSIS — N1832 Chronic kidney disease, stage 3b: Secondary | ICD-10-CM

## 2022-03-25 DIAGNOSIS — I509 Heart failure, unspecified: Secondary | ICD-10-CM | POA: Diagnosis not present

## 2022-03-25 DIAGNOSIS — I428 Other cardiomyopathies: Secondary | ICD-10-CM | POA: Insufficient documentation

## 2022-03-25 DIAGNOSIS — Z01818 Encounter for other preprocedural examination: Secondary | ICD-10-CM | POA: Insufficient documentation

## 2022-03-25 DIAGNOSIS — Z9889 Other specified postprocedural states: Secondary | ICD-10-CM | POA: Diagnosis not present

## 2022-03-25 DIAGNOSIS — I5042 Chronic combined systolic (congestive) and diastolic (congestive) heart failure: Secondary | ICD-10-CM

## 2022-03-25 DIAGNOSIS — Z09 Encounter for follow-up examination after completed treatment for conditions other than malignant neoplasm: Secondary | ICD-10-CM

## 2022-03-25 DIAGNOSIS — F419 Anxiety disorder, unspecified: Secondary | ICD-10-CM | POA: Insufficient documentation

## 2022-03-25 DIAGNOSIS — I400 Infective myocarditis: Secondary | ICD-10-CM

## 2022-03-25 LAB — COMPREHENSIVE METABOLIC PANEL
ALT: 19 U/L (ref 0–44)
AST: 21 U/L (ref 15–41)
Albumin: 3.2 g/dL — ABNORMAL LOW (ref 3.5–5.0)
Alkaline Phosphatase: 81 U/L (ref 38–126)
Anion gap: 12 (ref 5–15)
BUN: 33 mg/dL — ABNORMAL HIGH (ref 6–20)
CO2: 18 mmol/L — ABNORMAL LOW (ref 22–32)
Calcium: 9.2 mg/dL (ref 8.9–10.3)
Chloride: 103 mmol/L (ref 98–111)
Creatinine, Ser: 2.32 mg/dL — ABNORMAL HIGH (ref 0.61–1.24)
GFR, Estimated: 36 mL/min — ABNORMAL LOW (ref >60)
Glucose, Bld: 107 mg/dL — ABNORMAL HIGH (ref 70–99)
Potassium: 4.4 mmol/L (ref 3.5–5.1)
Sodium: 133 mmol/L — ABNORMAL LOW (ref 135–145)
Total Bilirubin: 1.6 mg/dL — ABNORMAL HIGH (ref 0.3–1.2)
Total Protein: 7.1 g/dL (ref 6.5–8.1)

## 2022-03-25 LAB — CBC
HCT: 33.7 % — ABNORMAL LOW (ref 39.0–52.0)
Hemoglobin: 11 g/dL — ABNORMAL LOW (ref 13.0–17.0)
MCH: 27.8 pg (ref 26.0–34.0)
MCHC: 32.6 g/dL (ref 30.0–36.0)
MCV: 85.1 fL (ref 80.0–100.0)
Platelets: 437 10*3/uL — ABNORMAL HIGH (ref 150–400)
RBC: 3.96 MIL/uL — ABNORMAL LOW (ref 4.22–5.81)
RDW: 20.5 % — ABNORMAL HIGH (ref 11.5–15.5)
WBC: 8.3 10*3/uL (ref 4.0–10.5)
nRBC: 0.4 % — ABNORMAL HIGH (ref 0.0–0.2)

## 2022-03-25 MED ORDER — SERTRALINE HCL 50 MG PO TABS: 50.0000 mg | ORAL_TABLET | Freq: Every day | ORAL | 3 refills | Status: DC

## 2022-03-25 NOTE — Progress Notes (Signed)
Advanced Heart Failure Clinic Note   Referring Physician: PCP: Sharilyn Sites, MD PCP-Cardiologist: Glori Bickers, MD   Reason for Visit: [facility] F/u for [place] w/ Cardiogenic Shock   HPI:  [name] [name] is a 41 year with a history of HFrEF/ NICM dating back to 2012 which had improved in 2013. HF meds stopped and had recurrent HFrEF 2013 with EF down to 20%. Also has HTN, PVCs, [name], PAF, Aortic Insufficiency,  aortic valve repair 2018 (with 21m annuloplasty ring), severe biventricular HF due to NICM.    Admitted 12/22/21 with A/C HFrEF. Diuresed with IV lasix. FWilder Gladestopped at patient's request. Echo showed EF down to 20-25%, severe AI . Had TEE 01/20/22 with severe central AI, EF 20%, and  severe RV dysfunction with plan for CPX and transplant evaluation.    Admitted 02/09/22 with A/C HFrEF-->low output. Diuresed with IV lasix and placed on milrinone. Unable to wean milrinone and discharged on milrinone 0.375 mcg. Discharged on 02/12/22.    Admitted 02/19/22 with recurrent cardiogenic shock. Milrinone increased to 0.5 mcg and given IV lasix. Poor response with minimal urine output and low mixed venous saturation. Low-dose norepi added without response. Given progressive acidosis taken to the cath lab for RHC/swan. Hemodynamics showed marked volume overload and low output. Decision was made to try IABP (despite severe AI). While in the cath lab developed respiratory distress. Bipap placed with improvement but later developed respiratory distress and required intubation. Developed A fib RVR. Started on amio drip with chemical conversion. [facility]    [facility]. Underwent AVR. Hospital course complicated by A fib RVR. Had DCCV on 02/26/22 with restoration of SR.  Discharged on milrinone 0.5 mcg.  Creatinine at discharge 1.6. Discharged on 03/10/22. Had issues getting discharge medications. He was unable  to get meds from pharmacy. Amio was picked up from the pharmacy on 03/12/22. He did not have eliquis.    [facility] pal[facility]ations and increased shortness of breath. On NRB but continued to decline and required intubation. EKG A fib RVR. Pertinent admission labs: BNP 2469, Hgb 10.5, WBC 9, mag 2.3, and creatinine 2.2. EKG A fib RVR. Started on amiodarone drip. Marked volume overload. Given 80 mg IV lasix. Minimal urine output.    [facility]red [facility].  Arrived intubated in A fib RVR 140s on amio 60 mg per hour + milrinone 0.5 mcg + fentanyl 100 mcg. He was sedated and underwent emergent DCCV back to NSR and admitted to ICU. Milrinone continued. Placed on amio and lasix gtt. 2D echo w/ severe biventricular dysfunction, LVEF < 20%, RV severely reduced, bioprosthetic AoV was stable, mean gradient 6 mmHg, no significant perivalvular leak.   He was diuresed w/ IV Lasix and successfully extubated but reverted back to Afib/Flutter, requiring repeat DCCV x 2 back to NSR. Also placed on empiric abx for possible aspiration PNA vs atelectasis. PCT 1.35. remained AF. No leukocytosis. Encouraged to use IS.   Volume status improved w/ diuresis. Transitioned to PO torsemide. Weaned off NE. Continued on milrinone 0.5 mcg/kg/min w/ stable co-ox. GDMT limited by renal function. D/w [facility], Will need OHTx +/- kidney in near future. D/c on 9/11 w/ home Milrinone. D/c wt 178 lb. D/c home w/ LifeVest.   Presents today for post hospital f/u. Here w/ wife. Feels much better. Can tell when he is in Afib. Denies any symptoms of  breakthrough Afib. EKG today shows NSR 100 bpm. QTc 469 ms. BP stable, 120/80. Wt stable at 180 lb. No resting dyspnea. SOB w/ activity, NYHA Class II-III. Reports full med compliance. No abnormal bleeding w/ Eliquis. PICC line site ok. Had home health visit yesterday. Continues w/ cough w/ clear phlegm. Denies fever/ chills. Using IS at home. Wearing LifeVest  today. Reports full compliance. No alarms.   Only main concern is anxiety. Asking for anti anxiety medication.     Echo 03/2022 Left ventricular ejection fraction, by estimation, is <20%. The left ventricle has severely decreased function. The left ventricle demonstrates global hypokinesis. The left ventricular internal cavity size was severely dilated. There is mild left ventricular hypertrophy. Left ventricular diastolic parameters are consistent with Grade III diastolic dysfunction (restrictive). 1. Right ventricular systolic function is severely reduced. The right ventricular size is normal. There is normal pulmonary artery systolic pressure. The estimated right ventricular systolic pressure is 40.8 mmHg. 2. 3. Left atrial size was mildly dilated. 4. Right atrial size was mildly dilated. The mitral valve is normal in structure. Mild to moderate mitral valve regurgitation. No evidence of mitral stenosis. 5. Bioprosthetic aortic valve. Mean gradient 6 mmHg with no significant perivalvular leakage. 6. Aortic dilatation noted. There is mild dilatation of the ascending aorta, measuring 43 mm. 7. The inferior vena cava is dilated in size with <50% respiratory variability, suggesting right atrial pressure of 15 mmHg.   Review of Systems: [y] = yes, '[ ]'$  = no   General: Weight gain '[ ]'$ ; Weight loss '[ ]'$ ; Anorexia '[ ]'$ ; Fatigue '[ ]'$ ; Fever '[ ]'$ ; Chills '[ ]'$ ; Weakness '[ ]'$   Cardiac: Chest pain/pressure '[ ]'$ ; Resting SOB '[ ]'$ ; Exertional SOB [Y ]; Orthopnea '[ ]'$ ; Pedal Edema '[ ]'$ ; Palpitations '[ ]'$ ; Syncope '[ ]'$ ; Presyncope '[ ]'$ ; Paroxysmal nocturnal dyspnea'[ ]'$   Pulmonary: Cough '[ ]'$ ; Wheezing'[ ]'$ ; Hemoptysis'[ ]'$ ; Sputum '[ ]'$ ; Snoring '[ ]'$   GI: Vomiting'[ ]'$ ; Dysphagia'[ ]'$ ; Melena'[ ]'$ ; Hematochezia '[ ]'$ ; Heartburn'[ ]'$ ; Abdominal pain '[ ]'$ ; Constipation '[ ]'$ ; Diarrhea '[ ]'$ ; BRBPR '[ ]'$   GU: Hematuria'[ ]'$ ; Dysuria '[ ]'$ ; Nocturia'[ ]'$   Vascular: Pain in legs with walking '[ ]'$ ; Pain in feet with lying flat '[ ]'$ ; Non-healing  sores '[ ]'$ ; Stroke '[ ]'$ ; TIA '[ ]'$ ; Slurred speech '[ ]'$ ;  Neuro: Headaches'[ ]'$ ; Vertigo'[ ]'$ ; Seizures'[ ]'$ ; Paresthesias'[ ]'$ ;Blurred vision '[ ]'$ ; Diplopia '[ ]'$ ; Vision changes '[ ]'$   Ortho/Skin: Arthritis '[ ]'$ ; Joint pain '[ ]'$ ; Muscle pain '[ ]'$ ; Joint swelling '[ ]'$ ; Back Pain '[ ]'$ ; Rash '[ ]'$   Psych: Depression'[ ]'$ ; Anxiety[Y ]  Heme: Bleeding problems '[ ]'$ ; Clotting disorders '[ ]'$ ; Anemia '[ ]'$   Endocrine: Diabetes '[ ]'$ ; Thyroid dysfunction'[ ]'$    Past Medical History:  Diagnosis Date   Anxiety    CHF (congestive heart failure) (HCC)    Chronic systolic heart failure (HCC)    CKD (chronic kidney disease) stage 2, GFR 60-89 ml/min    Dyspnea    with value issues- "if I dont take my medication"   Essential hypertension, benign    Headache    History of pneumonia    Mitral regurgitation    Moderate   Noncompliance    Nonischemic cardiomyopathy ([place])    Normal coronaries May 2012, LVEF < 20%   Pneumonia 2011   S/P aortic valve repair 05/11/2017   Complex valvuloplasty including plication of left coronary leaflet and 45m Biostable HAART ring annuloplasty    Current Outpatient  Medications  Medication Sig Dispense Refill   allopurinol (ZYLOPRIM) 300 MG tablet Take 300 mg by mouth daily.     amiodarone (PACERONE) 200 MG tablet Take 1 tablet (200 mg total) by mouth daily. 30 tablet 11   apixaban (ELIQUIS) 5 MG TABS tablet Take 1 tablet (5 mg total) by mouth 2 (two) times daily. 60 tablet 2   aspirin EC 81 MG tablet Take 81 mg by mouth daily. Swallow whole.     milrinone (PRIMACOR) 20 MG/100 ML SOLN infusion Inject 0.0486 mg/min into the vein continuous.     polyethylene glycol (MIRALAX / GLYCOLAX) 17 g packet Take 17 g by mouth daily as needed for mild constipation.     potassium chloride SA (KLOR-CON M) 20 MEQ tablet Take 1 tablet (20 mEq total) by mouth daily. 30 tablet 11   spironolactone (ALDACTONE) 25 MG tablet Take 0.5 tablets (12.5 mg total) by mouth daily. 15 tablet 11   temazepam (RESTORIL) 30 MG capsule  Take 1 capsule (30 mg total) by mouth at bedtime as needed for sleep. 30 capsule 0   torsemide (DEMADEX) 20 MG tablet Take 2 tablets (40 mg total) by mouth daily. Take another 2 tablets (40 mg) in the evening if you experience >3lbs weight gain in 1 week 70 tablet 0   No current facility-administered medications for this encounter.    No Known Allergies    Social History   Socioeconomic History   Marital status: Married    Spouse name: [name] [name]   Number of children: 2   Years of education: Not on file   Highest education level: Some college, no degree  Occupational History    Employer: [employer]  Tobacco Use   Smoking status: Never   Smokeless tobacco: Never  Vaping Use   Vaping Use: Never used  Substance and Sexual Activity   Alcohol use: Yes    Alcohol/week: 0.0 standard drinks of alcohol    Comment: occasional   Drug use: No   Sexual activity: Yes    Birth control/protection: Condom  Other Topics Concern   Not on file  Social History Narrative   Occupation: just started sanitation job cleaning      Patient is right-handed. He lives with his wife in a 1 story house. He drinks 1-2 sodas a day.    Social Determinants of Health   Financial Resource Strain: Not on file  Food Insecurity: Not on file  Transportation Needs: No Transportation Needs (09/28/2019)   PRAPARE - Hydrologist (Medical): No    Lack of Transportation (Non-Medical): No  Physical Activity: Insufficiently Active (09/28/2019)   Exercise Vital Sign    Days of Exercise per Week: 4 days    Minutes of Exercise per Session: 30 min  Stress: Not on file  Social Connections: Not on file  Intimate Partner Violence: Not on file      Family History  Problem Relation Age of Onset   Breast cancer Mother    Stroke Father    Hypertension Father     Vitals:   03/25/22 1531  BP: 120/80  Pulse: 100  SpO2: 99%  Weight: 82 kg (180 lb 12.8 oz)     PHYSICAL  EXAM: General:  thin young AAM, mildly fatigued appearing. No respiratory difficulty, wearing LifeVest  HEENT: normal Neck: supple. no JVD. Carotids 2+ bilat; no bruits. No lymphadenopathy or thyromegaly appreciated. Cor: PMI nondisplaced. Regular rate & rhythm. 2/6 RUSB murmur, increases w/ inspiration. Sternotomy  site well healed. + palpable and visible RV heave Lungs: clear Abdomen: soft, nontender, nondistended. No hepatosplenomegaly. No bruits or masses. Good bowel sounds. Extremities: no cyanosis, clubbing, rash, edema + LUE PICC  Neuro: alert & oriented x 3, cranial nerves grossly intact. moves all 4 extremities w/o difficulty. Affect pleasant.  ECG: NSR 100 bpm, QTC 469 ms    ASSESSMENT & PLAN:  Chronic Biventricular Heart Failure - Echo ~20% in 2013. EF 45-50% in 2016. Suspect due to ongoing AI +/- HTN - s/p AoV repair 10/18. F/u echo 12/18 with EF 45-50% moderate AI - Echo (2019): EF 30-35% Mod AI.  - Echo (8/20): EF 45% (out of range for ICD) - Echo (3/21): EF 45% moderate AI. LV dimensions stable (LVIDd 6.5cm) - Echo (10/22): EF 30-35% LV dilated. Mild-mod AI Mod-severe central [name] (LVIDd 6.0cm)  - Echo (6/23): EF 20-25%, severe biventricular dysfunction, mild to moderate [name], severe AI (V max  2.9 m/s, MG 16 mmHG.) - Admitted [facility] 8/23 w/ marked volume overload in the setting of cardiogenic shock. IABP placed, milrinone increased 0.5 mcg, and norepi started. Transferred to [facility] for transplant eval - At [facility], decision made to do tissue AVR 8/23. Did not get transplant. D/c home w/ milrinone + LifeVest  - Readmitted [facility] 9/23 w/ a/c BiV failure w/ cardiogenic shock, in setting of afib w/ RVR>>cardioverted and diuresed w/ lasix gtt  - Echo 9/23 EF < 20%, RV severely reduced, tissue AoV ok  - Euvolemic on exam. NYHA Class II-early III - continue on home milrinone 0.5 mcg/kg/min. Home health following  - Continue torsemide 40 mg daily  - Continue Spironolactone 12.5 mg daily  -  GDMT limited by renal function  - Check BMP today  - D/w [facility], will need OHTx +/- kidney in near future   2. S/p Tissue AVR - H/O AV repair 2018 with complex valvuloplasty including plication of left coronary leaflet and aortic ring annuloplasty -AVR [facility] 02/2022  -Echo 9/23 valve ok. mGrad 6 mmHg. No significant perivalvular leak   3. Paroxysmal Atrial Fibrillation  - multiple DCCV recent admit - EKG today shows NSR. Denies symptoms of breakthrough Afib  - continue amiodarone 200 mg day. Check HFTs and TFTs in 4-6 wks   - on Eliquis 5 mg bid. Denies abnormal bleeding. Check CBC today  - maintenance of NSR imperative. He will notify us if any symptoms of breakthrough Afib    4. Stage IIIB CKD - most recent SCr range, 2.5-3.0  - on home milrinone for inotropic support - check BMP today   5. Recent Hyponatremia -  require Tolvaptan recent admit -  check BMP today   6. Situational Anxiety  - anxious regarding recent medical issues - Rx for sertraline 25 mg x 1 week, then increase to 50 mg thereafter    F/u again in the [facility] in  3 wks    Lyda Jester, PA-C 03/25/22   Patient seen and examined with the above-signed Advanced Practice Provider and/or Housestaff. I personally reviewed laboratory data, imaging studies and relevant notes. I independently examined the patient and formulated the important aspects of the plan. I have edited the note to reflect any of my changes or salient points. I have personally discussed the plan with the patient and/or family.  41 y/o male with h/o severe AI s/p AV repair followed by AVR, severe systolic HF with biventricular dysfunction, PAF, CKD IV.   Recently underwent AVR at [facility]. Discharged home on milrinone 0.5. readmitted  to [facility] with cardiogenic shock and respiratory failure in setting of AF with RVR.   Underwent DC-CV x 2 and diuresis. Discharged home on milrinone.   Returns today for post-hospital visit with his wife. Feeling ok but  still very weak. Weight stable. Denies Edema, orthopnea or PND. Gets very anxious at times  General:  thin, weak appearing. No resp difficulty HEENT: normal Neck: supple.JVP 5-6. Carotids 2+ bilat; no bruits. No lymphadenopathy or thryomegaly appreciated. Cor: PMI nondisplaced. Regular tachy + s3 and marked RV lift  Lungs: clear Abdomen: soft, nontender, nondistended. No hepatosplenomegaly. No bruits or masses. Good bowel sounds. Extremities: no cyanosis, clubbing, rash, edema Neuro: alert & orientedx3, cranial nerves grossly intact. moves all 4 extremities w/o difficulty. Affect pleasant  He remains extremely tenuous with ongoing biV failure. Volume status ok . Maintaining NSR on po amio.   Will continue milrinone and amio. Check labs. Case d/w [facility] Transplant team. They will arrange f/u in near future to begin process for potential heart kidney transplant.   Glori Bickers, MD  11:13 PM

## 2022-03-25 NOTE — Patient Instructions (Signed)
Start Zoloft '50mg'$  daily. Take 1/2 a tablet for a week than increase to a full one   Labs done today, your results will be available in MyChart, we will contact you for abnormal readings.  Your physician recommends that you schedule a follow-up appointment in: 3 weeks  If you have any questions or concerns before your next appointment please send [place] a message through [place] or call our office at [phone].    TO LEAVE A MESSAGE FOR THE NURSE SELECT OPTION 2, PLEASE LEAVE A MESSAGE INCLUDING: YOUR NAME DATE OF BIRTH CALL BACK NUMBER REASON FOR CALL**this is important as we prioritize the call backs  YOU WILL RECEIVE A CALL BACK THE SAME DAY AS LONG AS YOU CALL BEFORE 4:00 PM  At the [facility], you and your health needs are our priority. As part of our continuing mission to provide you with exceptional heart care, we have created designated Provider Care Teams. These Care Teams include your primary Cardiologist (physician) and Advanced Practice Providers (APPs- Physician Assistants and Nurse Practitioners) who all work together to provide you with the care you need, when you need it.   You may see any of the following providers on your designated Care Team at your next follow up: Dr Glori Bickers Dr Loralie Champagne Dr. Roxana Hires, NP Lyda Jester, [place] [facility] [place], [place] Forestine Na, NP Audry Riles, PharmD   Please be sure to bring in all your medications bottles to every appointment.

## 2022-03-26 DIAGNOSIS — I509 Heart failure, unspecified: Secondary | ICD-10-CM | POA: Diagnosis not present

## 2022-03-27 DIAGNOSIS — J208 Acute bronchitis due to other specified organisms: Secondary | ICD-10-CM | POA: Diagnosis not present

## 2022-03-27 DIAGNOSIS — Z954 Presence of other heart-valve replacement: Secondary | ICD-10-CM | POA: Diagnosis not present

## 2022-03-27 DIAGNOSIS — I10 Essential (primary) hypertension: Secondary | ICD-10-CM | POA: Diagnosis not present

## 2022-03-27 DIAGNOSIS — I420 Dilated cardiomyopathy: Secondary | ICD-10-CM | POA: Diagnosis not present

## 2022-03-27 DIAGNOSIS — Z6827 Body mass index (BMI) 27.0-27.9, adult: Secondary | ICD-10-CM | POA: Diagnosis not present

## 2022-03-27 DIAGNOSIS — I429 Cardiomyopathy, unspecified: Secondary | ICD-10-CM | POA: Diagnosis not present

## 2022-03-27 DIAGNOSIS — I509 Heart failure, unspecified: Secondary | ICD-10-CM | POA: Diagnosis not present

## 2022-03-27 DIAGNOSIS — I5042 Chronic combined systolic (congestive) and diastolic (congestive) heart failure: Secondary | ICD-10-CM | POA: Diagnosis not present

## 2022-03-27 DIAGNOSIS — I517 Cardiomegaly: Secondary | ICD-10-CM | POA: Diagnosis not present

## 2022-03-28 DIAGNOSIS — I509 Heart failure, unspecified: Secondary | ICD-10-CM | POA: Diagnosis not present

## 2022-03-29 DIAGNOSIS — I509 Heart failure, unspecified: Secondary | ICD-10-CM | POA: Diagnosis not present

## 2022-03-30 DIAGNOSIS — I509 Heart failure, unspecified: Secondary | ICD-10-CM | POA: Diagnosis not present

## 2022-04-02 ENCOUNTER — Emergency Department (HOSPITAL_COMMUNITY): Payer: BC Managed Care – PPO

## 2022-04-02 ENCOUNTER — Encounter (HOSPITAL_COMMUNITY): Payer: Self-pay

## 2022-04-02 ENCOUNTER — Inpatient Hospital Stay (HOSPITAL_COMMUNITY)
Admission: EM | Admit: 2022-04-02 | Discharge: 2022-04-06 | DRG: 286 | Disposition: A | Discharge status: Other Acute Inpatient Hospital | Payer: BC Managed Care – PPO | Attending: Internal Medicine | Admitting: Internal Medicine

## 2022-04-02 ENCOUNTER — Other Ambulatory Visit: Payer: Self-pay

## 2022-04-02 ENCOUNTER — Telehealth (HOSPITAL_COMMUNITY): Payer: Self-pay

## 2022-04-02 DIAGNOSIS — Z8249 Family history of ischemic heart disease and other diseases of the circulatory system: Secondary | ICD-10-CM | POA: Diagnosis not present

## 2022-04-02 DIAGNOSIS — I509 Heart failure, unspecified: Secondary | ICD-10-CM | POA: Diagnosis not present

## 2022-04-02 DIAGNOSIS — I5082 Biventricular heart failure: Secondary | ICD-10-CM | POA: Diagnosis present

## 2022-04-02 DIAGNOSIS — I130 Hypertensive heart and chronic kidney disease with heart failure and stage 1 through stage 4 chronic kidney disease, or unspecified chronic kidney disease: Secondary | ICD-10-CM | POA: Diagnosis not present

## 2022-04-02 DIAGNOSIS — Z8701 Personal history of pneumonia (recurrent): Secondary | ICD-10-CM | POA: Diagnosis not present

## 2022-04-02 DIAGNOSIS — Z7682 Awaiting organ transplant status: Secondary | ICD-10-CM | POA: Diagnosis not present

## 2022-04-02 DIAGNOSIS — I428 Other cardiomyopathies: Secondary | ICD-10-CM | POA: Diagnosis present

## 2022-04-02 DIAGNOSIS — J90 Pleural effusion, not elsewhere classified: Secondary | ICD-10-CM | POA: Diagnosis not present

## 2022-04-02 DIAGNOSIS — I480 Paroxysmal atrial fibrillation: Secondary | ICD-10-CM | POA: Diagnosis present

## 2022-04-02 DIAGNOSIS — Z803 Family history of malignant neoplasm of breast: Secondary | ICD-10-CM

## 2022-04-02 DIAGNOSIS — E872 Acidosis, unspecified: Secondary | ICD-10-CM | POA: Diagnosis present

## 2022-04-02 DIAGNOSIS — F419 Anxiety disorder, unspecified: Secondary | ICD-10-CM | POA: Diagnosis present

## 2022-04-02 DIAGNOSIS — I080 Rheumatic disorders of both mitral and aortic valves: Secondary | ICD-10-CM | POA: Diagnosis present

## 2022-04-02 DIAGNOSIS — Z823 Family history of stroke: Secondary | ICD-10-CM | POA: Diagnosis not present

## 2022-04-02 DIAGNOSIS — K5900 Constipation, unspecified: Secondary | ICD-10-CM | POA: Diagnosis present

## 2022-04-02 DIAGNOSIS — I110 Hypertensive heart disease with heart failure: Secondary | ICD-10-CM | POA: Diagnosis not present

## 2022-04-02 DIAGNOSIS — N1832 Chronic kidney disease, stage 3b: Secondary | ICD-10-CM | POA: Diagnosis not present

## 2022-04-02 DIAGNOSIS — R079 Chest pain, unspecified: Secondary | ICD-10-CM | POA: Diagnosis not present

## 2022-04-02 DIAGNOSIS — Z952 Presence of prosthetic heart valve: Secondary | ICD-10-CM | POA: Diagnosis not present

## 2022-04-02 DIAGNOSIS — Z01818 Encounter for other preprocedural examination: Secondary | ICD-10-CM | POA: Diagnosis not present

## 2022-04-02 DIAGNOSIS — Z7901 Long term (current) use of anticoagulants: Secondary | ICD-10-CM | POA: Diagnosis not present

## 2022-04-02 DIAGNOSIS — Z79899 Other long term (current) drug therapy: Secondary | ICD-10-CM | POA: Diagnosis not present

## 2022-04-02 DIAGNOSIS — I5023 Acute on chronic systolic (congestive) heart failure: Secondary | ICD-10-CM | POA: Diagnosis not present

## 2022-04-02 DIAGNOSIS — Z7982 Long term (current) use of aspirin: Secondary | ICD-10-CM | POA: Diagnosis not present

## 2022-04-02 DIAGNOSIS — R570 Cardiogenic shock: Secondary | ICD-10-CM | POA: Diagnosis not present

## 2022-04-02 LAB — BASIC METABOLIC PANEL
Anion gap: 15 (ref 5–15)
BUN: 43 mg/dL — ABNORMAL HIGH (ref 6–20)
CO2: 18 mmol/L — ABNORMAL LOW (ref 22–32)
Calcium: 9.7 mg/dL (ref 8.9–10.3)
Chloride: 98 mmol/L (ref 98–111)
Creatinine, Ser: 2.64 mg/dL — ABNORMAL HIGH (ref 0.61–1.24)
GFR, Estimated: 30 mL/min — ABNORMAL LOW (ref >60)
Glucose, Bld: 162 mg/dL — ABNORMAL HIGH (ref 70–99)
Potassium: 4.6 mmol/L (ref 3.5–5.1)
Sodium: 131 mmol/L — ABNORMAL LOW (ref 135–145)

## 2022-04-02 LAB — CBC
HCT: 35.7 % — ABNORMAL LOW (ref 39.0–52.0)
Hemoglobin: 11.7 g/dL — ABNORMAL LOW (ref 13.0–17.0)
MCH: 27.9 pg (ref 26.0–34.0)
MCHC: 32.8 g/dL (ref 30.0–36.0)
MCV: 85 fL (ref 80.0–100.0)
Platelets: 262 10*3/uL (ref 150–400)
RBC: 4.2 MIL/uL — ABNORMAL LOW (ref 4.22–5.81)
RDW: 22 % — ABNORMAL HIGH (ref 11.5–15.5)
WBC: 7.4 10*3/uL (ref 4.0–10.5)
nRBC: 0.8 % — ABNORMAL HIGH (ref 0.0–0.2)

## 2022-04-02 LAB — BRAIN NATRIURETIC PEPTIDE: B Natriuretic Peptide: 2570.9 pg/mL — ABNORMAL HIGH (ref 0.0–100.0)

## 2022-04-02 LAB — TROPONIN I (HIGH SENSITIVITY)
Troponin I (High Sensitivity): 132 ng/L (ref <18)
Troponin I (High Sensitivity): 123 ng/L (ref <18)

## 2022-04-02 LAB — HEPATIC FUNCTION PANEL
ALT: 17 U/L (ref 0–44)
AST: 23 U/L (ref 15–41)
Albumin: 3.5 g/dL (ref 3.5–5.0)
Alkaline Phosphatase: 90 U/L (ref 38–126)
Bilirubin, Direct: 0.7 mg/dL — ABNORMAL HIGH (ref 0.0–0.2)
Indirect Bilirubin: 1.1 mg/dL — ABNORMAL HIGH (ref 0.3–0.9)
Total Bilirubin: 1.8 mg/dL — ABNORMAL HIGH (ref 0.3–1.2)
Total Protein: 7.3 g/dL (ref 6.5–8.1)

## 2022-04-02 LAB — LIPASE, BLOOD: Lipase: 45 U/L (ref 11–51)

## 2022-04-02 LAB — LACTIC ACID, PLASMA
Lactic Acid, Venous: 3.2 mmol/L (ref 0.5–1.9)
Lactic Acid, Venous: 2.9 mmol/L (ref 0.5–1.9)

## 2022-04-02 LAB — MRSA NEXT GEN BY PCR, NASAL: MRSA by PCR Next Gen: NOT DETECTED

## 2022-04-02 MED ORDER — ALTEPLASE 2 MG IJ SOLR
2.0000 mg | Freq: Once | INTRAMUSCULAR | Status: AC
  Administered 2022-04-02: 2 mg
  Filled 2022-04-02: qty 2

## 2022-04-02 MED ORDER — HYDRALAZINE HCL 20 MG/ML IJ SOLN
10.0000 mg | Freq: Once | INTRAMUSCULAR | Status: AC
  Administered 2022-04-02: 10 mg via INTRAVENOUS
  Filled 2022-04-02: qty 1

## 2022-04-02 MED ORDER — CHLORHEXIDINE GLUCONATE CLOTH 2 % EX PADS
6.0000 | MEDICATED_PAD | Freq: Every day | CUTANEOUS | Status: DC
  Administered 2022-04-03 – 2022-04-06 (×4): 6 via TOPICAL

## 2022-04-02 MED ORDER — SODIUM CHLORIDE 0.9 % IV SOLN: INTRAVENOUS | Status: DC

## 2022-04-02 MED ORDER — MILRINONE LACTATE IN DEXTROSE 20-5 MG/100ML-% IV SOLN: 0.5000 ug/kg/min | INTRAVENOUS | Status: DC

## 2022-04-02 MED ORDER — SODIUM CHLORIDE 0.9% FLUSH
3.0000 mL | Freq: Two times a day (BID) | INTRAVENOUS | Status: DC
  Administered 2022-04-03 – 2022-04-05 (×4): 3 mL via INTRAVENOUS

## 2022-04-02 MED ORDER — ASPIRIN 81 MG PO CHEW
81.0000 mg | CHEWABLE_TABLET | ORAL | Status: AC
  Administered 2022-04-03: 81 mg via ORAL
  Filled 2022-04-02: qty 1

## 2022-04-02 MED ORDER — SERTRALINE HCL 50 MG PO TABS: 50.0000 mg | ORAL_TABLET | Freq: Every day | ORAL | Status: DC

## 2022-04-02 MED ORDER — SODIUM CHLORIDE 0.9% FLUSH: 3.0000 mL | INTRAVENOUS | Status: DC | PRN

## 2022-04-02 MED ORDER — ASPIRIN 81 MG PO TBEC
81.0000 mg | DELAYED_RELEASE_TABLET | Freq: Every day | ORAL | Status: DC
  Filled 2022-04-02: qty 1

## 2022-04-02 MED ORDER — ASPIRIN 81 MG PO TBEC
81.0000 mg | DELAYED_RELEASE_TABLET | Freq: Every day | ORAL | Status: DC
  Administered 2022-04-04 – 2022-04-06 (×3): 81 mg via ORAL
  Filled 2022-04-02 (×4): qty 1

## 2022-04-02 MED ORDER — ALLOPURINOL 300 MG PO TABS: 300.0000 mg | ORAL_TABLET | Freq: Every day | ORAL | Status: DC

## 2022-04-02 MED ORDER — POTASSIUM CHLORIDE CRYS ER 20 MEQ PO TBCR
20.0000 meq | EXTENDED_RELEASE_TABLET | Freq: Every day | ORAL | Status: DC
  Filled 2022-04-02: qty 1

## 2022-04-02 MED ORDER — TEMAZEPAM 15 MG PO CAPS
30.0000 mg | ORAL_CAPSULE | Freq: Every evening | ORAL | Status: DC | PRN
  Administered 2022-04-02 – 2022-04-05 (×4): 30 mg via ORAL
  Filled 2022-04-02 (×4): qty 2

## 2022-04-02 MED ORDER — ALLOPURINOL 300 MG PO TABS
300.0000 mg | ORAL_TABLET | Freq: Every day | ORAL | Status: DC
  Administered 2022-04-03 – 2022-04-06 (×4): 300 mg via ORAL
  Filled 2022-04-02 (×4): qty 1

## 2022-04-02 MED ORDER — FUROSEMIDE 10 MG/ML IJ SOLN
80.0000 mg | Freq: Two times a day (BID) | INTRAMUSCULAR | Status: DC
  Administered 2022-04-02 – 2022-04-04 (×4): 80 mg via INTRAVENOUS
  Filled 2022-04-02 (×4): qty 8

## 2022-04-02 MED ORDER — SODIUM CHLORIDE 0.9% FLUSH: 10.0000 mL | INTRAVENOUS | Status: DC | PRN

## 2022-04-02 MED ORDER — AMIODARONE HCL 200 MG PO TABS
200.0000 mg | ORAL_TABLET | Freq: Every day | ORAL | Status: DC
  Administered 2022-04-03 – 2022-04-06 (×4): 200 mg via ORAL
  Filled 2022-04-02 (×4): qty 1

## 2022-04-02 MED ORDER — SODIUM CHLORIDE 0.9% FLUSH
3.0000 mL | Freq: Two times a day (BID) | INTRAVENOUS | Status: DC
  Administered 2022-04-02 – 2022-04-05 (×4): 3 mL via INTRAVENOUS

## 2022-04-02 MED ORDER — SERTRALINE HCL 25 MG PO TABS
50.0000 mg | ORAL_TABLET | Freq: Every day | ORAL | Status: DC
  Administered 2022-04-03 – 2022-04-06 (×3): 50 mg via ORAL
  Filled 2022-04-02 (×4): qty 2

## 2022-04-02 MED ORDER — SODIUM CHLORIDE 0.9% FLUSH
10.0000 mL | Freq: Two times a day (BID) | INTRAVENOUS | Status: DC
  Administered 2022-04-02: 20 mL
  Administered 2022-04-03 – 2022-04-06 (×4): 10 mL

## 2022-04-02 MED ORDER — ONDANSETRON HCL 4 MG/2ML IJ SOLN
4.0000 mg | Freq: Once | INTRAMUSCULAR | Status: AC
  Administered 2022-04-02: 4 mg via INTRAVENOUS
  Filled 2022-04-02: qty 2

## 2022-04-02 MED ORDER — METOLAZONE 5 MG PO TABS
5.0000 mg | ORAL_TABLET | Freq: Once | ORAL | Status: AC
  Administered 2022-04-02: 5 mg via ORAL
  Filled 2022-04-02: qty 1

## 2022-04-02 MED ORDER — SODIUM CHLORIDE 0.9 % IV SOLN: 250.0000 mL | INTRAVENOUS | Status: DC | PRN

## 2022-04-02 MED ORDER — MILRINONE LACTATE IN DEXTROSE 20-5 MG/100ML-% IV SOLN
0.5000 ug/kg/min | INTRAVENOUS | Status: DC
  Administered 2022-04-02 – 2022-04-06 (×11): 0.5 ug/kg/min via INTRAVENOUS
  Filled 2022-04-02 (×11): qty 100

## 2022-04-02 MED ORDER — SPIRONOLACTONE 12.5 MG HALF TABLET: 12.5000 mg | ORAL_TABLET | Freq: Every day | ORAL | Status: DC

## 2022-04-02 MED ORDER — ACETAMINOPHEN 325 MG PO TABS: 650.0000 mg | ORAL_TABLET | ORAL | Status: DC | PRN

## 2022-04-02 MED ORDER — SPIRONOLACTONE 12.5 MG HALF TABLET
12.5000 mg | ORAL_TABLET | Freq: Every day | ORAL | Status: DC
  Administered 2022-04-03 – 2022-04-04 (×2): 12.5 mg via ORAL
  Filled 2022-04-02 (×2): qty 1

## 2022-04-02 MED ORDER — AMIODARONE HCL 200 MG PO TABS
200.0000 mg | ORAL_TABLET | Freq: Every day | ORAL | Status: DC
  Filled 2022-04-02: qty 1

## 2022-04-02 MED ORDER — APIXABAN 5 MG PO TABS
5.0000 mg | ORAL_TABLET | Freq: Two times a day (BID) | ORAL | Status: DC
  Administered 2022-04-02 – 2022-04-03 (×2): 5 mg via ORAL
  Filled 2022-04-02 (×2): qty 1

## 2022-04-02 NOTE — Telephone Encounter (Signed)
Patient called stating that he vomited on Sunday and felt like he had to again today. He also reports that he has been very nauseous and has had no appetite. Per Dr. Haroldine Laws patient needs to go to the ER. Patient advised and verbalized understanding.

## 2022-04-02 NOTE — H&P (Addendum)
Advanced Heart Failure Team H&P    Primary Physician: Sharilyn Sites, MD PCP-Cardiologist:  Glori Bickers, MD  Reason for Consultation: Acute/Chronic Heart Failure   HPI:    [name] [name] is seen today for evaluation of heart failure  at the request of Dr Ernesto Rutherford.   Mr [name] [name] is a 41 year old with complicated medical history of severe biventricular CHF/NICM on milrinone 0.5 mcg, severe aortic valve insufficiency s/p repair in 2018, SAVR with 27 mm Inspiris valve [facility] on 02/2022, CKD Stage IIIb, and PAF.  Upcoming transplant evaluation at [facility] 10/2-10/5/23 for heart/kidney.    Admitted 12/22/21 with A/C HFrEF. Diuresed with IV lasix. Wilder Glade stopped at patient's request. Echo showed EF down to 20-25%, severe AI . Had TEE 01/20/22 with severe central AI, EF 20%, and  severe RV dysfunction with plan for CPX and transplant evaluation.    Admitted 02/09/22 with A/C HFrEF-->low output. Diuresed with IV lasix and placed on milrinone. Unable to wean milrinone and discharged on milrinone 0.375 mcg. Discharged on 02/12/22.    Admitted 02/19/22 with recurrent cardiogenic shock. Milrinone increased to 0.5 mcg and given IV lasix. Poor response with minimal urine output and low mixed venous saturation. Low-dose norepi added without response. Given progressive acidosis taken to the cath lab for RHC/swan. Hemodynamics showed marked volume overload and low output. Decision was made to try IABP (despite severe AI). While in the cath lab developed respiratory distress. Bipap placed with improvement but later developed respiratory distress and required intubation. Developed A fib RVR. Started on amio drip with chemical conversion. Transferred to [place] 02/20/22 for high risk AVR.    Admitted to [facility] 02/20/22. CT surgery consulted. Underwent AVR. Hospital course complicated by A fib RVR. Had DCCV on 02/26/22 with restoration of SR.  Discharged on milrinone 0.5 mcg.  Creatinine at discharge 1.6. Discharged on  03/10/22. Had issues getting discharge medications. He was unable to get meds from pharmacy. Amio was picked up from the pharmacy on 03/12/22. He did not have eliquis.   Readmitted 03/13/22 with cardiogenic shock and A fib RVR. Emergent cardioversion. Diuresed with IV lasix and continue on milrinone. Pressor weaned.  2D echo w/ severe biventricular dysfunction, LVEF < 20%, RV severely reduced, bioprosthetic AoV was stable, mean gradient 6 mmHg, no significant perivalvular leak. Discharged on milrinone 0.5 mcg.    Saw Dr Haroldine Laws in the HF clinic on 03/25/22. Stable on 0.5 mcg of milrinone.   Feeling ok until Sunday. Had episode of nausea/vomiting. Appetite has been poor. Productive cough clear sputum. SOB with exertion.  Weight at home 178-180 pounds. No fever or chills. No bleeding issues. No issues with milrinone infusion. No issues with PICC.   Presented to ED with fatigue and increased shortness of breath. On milrinone 0.5 mcg. CXR- pulmonary edema and RLL pleural effusion. EKG SR 97 bpm . BNP > 2500. Lactic acid 3.2. Creatinine 2.6.    Review of Systems: [y] = yes, '[ ]'$  = no   General: Weight gain '[ ]'$ ; Weight loss '[ ]'$ ; Anorexia '[ ]'$ ; Fatigue [ Y]; Fever '[ ]'$ ; Chills '[ ]'$ ; Weakness [ Y]  Cardiac: Chest pain/pressure '[ ]'$ ; Resting SOB '[ ]'$ ; Exertional SOB [ Y]; Orthopnea [Y ]; Pedal Edema '[ ]'$ ; Palpitations '[ ]'$ ; Syncope '[ ]'$ ; Presyncope '[ ]'$ ; Paroxysmal nocturnal dyspnea'[ ]'$   Pulmonary: Cough '[ ]'$ ; Wheezing'[ ]'$ ; Hemoptysis'[ ]'$ ; Sputum '[ ]'$ ; Snoring '[ ]'$   GI: Vomiting[Y ]; Dysphagia'[ ]'$ ; Melena'[ ]'$ ; Hematochezia '[ ]'$ ; Heartburn'[ ]'$ ;  Abdominal pain '[ ]'$ ; Constipation '[ ]'$ ; Diarrhea '[ ]'$ ; BRBPR '[ ]'$   GU: Hematuria'[ ]'$ ; Dysuria '[ ]'$ ; Nocturia'[ ]'$   Vascular: Pain in legs with walking '[ ]'$ ; Pain in feet with lying flat '[ ]'$ ; Non-healing sores '[ ]'$ ; Stroke '[ ]'$ ; TIA '[ ]'$ ; Slurred speech '[ ]'$ ;  Neuro: Headaches'[ ]'$ ; Vertigo'[ ]'$ ; Seizures'[ ]'$ ; Paresthesias'[ ]'$ ;Blurred vision '[ ]'$ ; Diplopia '[ ]'$ ; Vision changes '[ ]'$   Ortho/Skin: Arthritis '[ ]'$ ;  Joint pain [ Y]; Muscle pain '[ ]'$ ; Joint swelling '[ ]'$ ; Back Pain [Y ]; Rash '[ ]'$   Psych: Depression'[ ]'$ ; Anxiety[ Y]  Heme: Bleeding problems '[ ]'$ ; Clotting disorders '[ ]'$ ; Anemia '[ ]'$   Endocrine: Diabetes '[ ]'$ ; Thyroid dysfunction'[ ]'$   Home Medications Prior to Admission medications   Medication Sig Start Date End Date Taking? Authorizing Provider  allopurinol (ZYLOPRIM) 300 MG tablet Take 300 mg by mouth daily. 01/13/22   [provider]  amiodarone (PACERONE) 200 MG tablet Take 1 tablet (200 mg total) by mouth daily. 03/23/22   Corky Blumstein, Shaune Pascal, MD  apixaban (ELIQUIS) 5 MG TABS tablet Take 1 tablet (5 mg total) by mouth 2 (two) times daily. 03/23/22   Sundai Probert, Shaune Pascal, MD  aspirin EC 81 MG tablet Take 81 mg by mouth daily. Swallow whole.    [provider]  milrinone (PRIMACOR) 20 MG/100 ML SOLN infusion Inject 0.0486 mg/min into the vein continuous. 02/20/22   Clegg, Amy D, NP  polyethylene glycol (MIRALAX / GLYCOLAX) 17 g packet Take 17 g by mouth daily as needed for mild constipation. 03/10/22   [provider]  potassium chloride SA (KLOR-CON M) 20 MEQ tablet Take 1 tablet (20 mEq total) by mouth daily. 03/23/22   Joette Catching, PA-C  sertraline (ZOLOFT) 50 MG tablet Take 1 tablet (50 mg total) by mouth daily. 03/25/22   Jozlin Bently, Shaune Pascal, MD  spironolactone (ALDACTONE) 25 MG tablet Take 0.5 tablets (12.5 mg total) by mouth daily. 03/23/22 03/23/23  Joette Catching, PA-C  temazepam (RESTORIL) 30 MG capsule Take 1 capsule (30 mg total) by mouth at bedtime as needed for sleep. 03/23/22   Earnie Larsson, NP  torsemide (DEMADEX) 20 MG tablet Take 2 tablets (40 mg total) by mouth daily. Take another 2 tablets (40 mg) in the evening if you experience >3lbs weight gain in 1 week 03/23/22 04/22/22  Joette Catching, PA-C    Past Medical History: Past Medical History:  Diagnosis Date   Anxiety    CHF (congestive heart failure) ([place])    Chronic systolic heart  failure (HCC)    CKD (chronic kidney disease) stage 2, GFR 60-89 ml/min    Dyspnea    with value issues- "if I dont take my medication"   Essential hypertension, benign    Headache    History of pneumonia    Mitral regurgitation    Moderate   Noncompliance    Nonischemic cardiomyopathy ([place])    Normal coronaries May 2012, LVEF < 20%   Pneumonia 2011   S/P aortic valve repair 05/11/2017   Complex valvuloplasty including plication of left coronary leaflet and 65m Biostable HAART ring annuloplasty    Past Surgical History: Past Surgical History:  Procedure Laterality Date   AORTIC VALVE REPAIR N/A 05/11/2017   Procedure: AORTIC VALVE REPAIR;  Surgeon: ORexene Alberts MD;  [place] [facility]  Service: Open Heart Surgery;  Laterality: N/A;   CARDIAC SURGERY     IABP INSERTION N/A 02/19/2022  Procedure: IABP Insertion;  Surgeon: Jolaine Artist, MD;  Location: [place] [facility];  Service: Cardiovascular;  Laterality: N/A;   RIGHT HEART CATH N/A 02/19/2022   Procedure: RIGHT HEART CATH;  Surgeon: Jolaine Artist, MD;  Location: [place] [facility];  Service: Cardiovascular;  Laterality: N/A;   RIGHT/LEFT HEART CATH AND CORONARY ANGIOGRAPHY N/A 04/20/2017   Procedure: RIGHT/LEFT HEART CATH AND CORONARY ANGIOGRAPHY;  Surgeon: Jolaine Artist, MD;  Location: [place] [facility];  Service: Cardiovascular;  Laterality: N/A;   SURGERY SCROTAL / TESTICULAR     Testicular torsion   TEE WITHOUT CARDIOVERSION N/A 04/09/2017   Procedure: TRANSESOPHAGEAL ECHOCARDIOGRAM (TEE);  Surgeon: Jolaine Artist, MD;  Location: [facility] ENDOSCOPY;  Service: Cardiovascular;  Laterality: N/A;   TEE WITHOUT CARDIOVERSION N/A 05/11/2017   Procedure: TRANSESOPHAGEAL ECHOCARDIOGRAM (TEE);  Surgeon: Rexene Alberts, MD;  Location: [place];  Service: Open Heart Surgery;  Laterality: N/A;   TEE WITHOUT CARDIOVERSION N/A 01/20/2022   Procedure: TRANSESOPHAGEAL ECHOCARDIOGRAM (TEE);  Surgeon: Jolaine Artist,  MD;  Location: [facility] ENDOSCOPY;  Service: Cardiovascular;  Laterality: N/A;    Family History: Family History  Problem Relation Age of Onset   Breast cancer Mother    Stroke Father    Hypertension Father     Social History: Social History   Socioeconomic History   Marital status: Married    Spouse name: RaShannon   Number of children: 2   Years of education: Not on file   Highest education level: Some college, no degree  Occupational History    Employer: [employer]  Tobacco Use   Smoking status: Never   Smokeless tobacco: Never  Vaping Use   Vaping Use: Never used  Substance and Sexual Activity   Alcohol use: Yes    Alcohol/week: 0.0 standard drinks of alcohol    Comment: occasional   Drug use: No   Sexual activity: Yes    Birth control/protection: Condom  Other Topics Concern   Not on file  Social History Narrative   Occupation: just started sanitation job cleaning      Patient is right-handed. He lives with his wife in a 1 story house. He drinks 1-2 sodas a day.    Social Determinants of Health   Financial Resource Strain: Not on file  Food Insecurity: Not on file  Transportation Needs: No Transportation Needs (09/28/2019)   PRAPARE - Hydrologist (Medical): No    Lack of Transportation (Non-Medical): No  Physical Activity: Insufficiently Active (09/28/2019)   Exercise Vital Sign    Days of Exercise per Week: 4 days    Minutes of Exercise per Session: 30 min  Stress: Not on file  Social Connections: Not on file    Allergies:  No Known Allergies  Objective:    Vital Signs:   Temp:  [97.7 F (36.5 C)] 97.7 F (36.5 C) (09/21 1513) Pulse Rate:  [97] 97 (09/21 1513) Resp:  [16] 16 (09/21 1513) BP: (129)/(97) 129/97 (09/21 1513) SpO2:  [98 %] 98 % (09/21 1513) Weight:  [81.6 kg] 81.6 kg (09/21 1519)    Weight change: Filed Weights   04/02/22 1519  Weight: 81.6 kg    Intake/Output:  No intake or output data in  the 24 hours ending 04/02/22 1547    Physical Exam    General:   No resp difficulty HEENT: normal Neck: supple. JVP 9-10 . Carotids 2+ bilat; no bruits. No lymphadenopathy or thyromegaly appreciated. Cor: PMI  nondisplaced. Regular rate & rhythm. No rubs, gallops or murmurs. Lungs: Crackles in the bases /Decreased RLLon room air.  Abdomen: soft, nontender, nondistended. No hepatosplenomegaly. No bruits or masses. Good bowel sounds. Extremities: no cyanosis, clubbing, rash, edema. L upper extremity PICC Neuro: alert & orientedx3, cranial nerves grossly intact. moves all 4 extremities w/o difficulty. Affect pleasant   Telemetry  SR 90s    EKG    SR 97 bpm   Labs   Basic Metabolic Panel: No results for input(s): "NA", "K", "CL", "CO2", "GLUCOSE", "BUN", "CREATININE", "CALCIUM", "MG", "PHOS" in the last 168 hours.  Liver Function Tests: No results for input(s): "AST", "ALT", "ALKPHOS", "BILITOT", "PROT", "ALBUMIN" in the last 168 hours. No results for input(s): "LIPASE", "AMYLASE" in the last 168 hours. No results for input(s): "AMMONIA" in the last 168 hours.  CBC: No results for input(s): "WBC", "NEUTROABS", "HGB", "HCT", "MCV", "PLT" in the last 168 hours.  Cardiac Enzymes: No results for input(s): "CKTOTAL", "CKMB", "CKMBINDEX", "TROPONINI" in the last 168 hours.  BNP: BNP (last 3 results) Recent Labs    02/19/22 1002 03/13/22 1007 03/13/22 1410  BNP 3,100.6* 2,469.0* 3,264.6*    ProBNP (last 3 results) No results for input(s): "PROBNP" in the last 8760 hours.   CBG: No results for input(s): "GLUCAP" in the last 168 hours.  Coagulation Studies: No results for input(s): "LABPROT", "INR" in the last 72 hours.   Imaging   No results found.   Medications:     Current Medications:   Infusions:     Patient Profile   [name] [name] is a 41 year old with complicated medical history of severe biventricular CHF/NICM on milrinone 0.5 mcg, severe aortic  valve insufficiency s/p repair in 2018, SAVR with 27 mm Inspiris valve at [facility] on 08/13/CKD IIIb , and PAF.    Assessment/Plan  1. A/C Biventricular HF -Echo ~20% in 2013. EF 45-50% in 2016. Suspect due to ongoing AI +/- HTN - s/p AoV repair 10/18. F/u echo 12/18 with EF 45-50% moderate AI - Echo (2019): EF 30-35% Mod AI.  - Echo (8/20): EF 45% (out of range for ICD) - Echo (3/21): EF 45% moderate AI. LV dimensions stable (LVIDd 6.5cm) - Echo (10/22): EF 30-35% LV dilated. Mild-mod AI Mod-severe central MR (LVIDd 6.0cm)  - Echo (6/23): EF 20-25%, severe biventricular dysfunction, mild to moderate MR, severe AI (V max  2.9 m/s, MG 16 mmHG.) - Admitted [facility] 8/23 w/ marked volume overload in the setting of cardiogenic shock. IABP placed, milrinone increased 0.5 mcg, and norepi started. Transferred to [facility] for transplant eval - At [facility], decision made to do tissue AVR 8/23. Did not get transplant. D/c home w/ milrinone + LifeVest  - Admitted 9/23 with a/c BiV failure w/ cardiogenic shock, in setting of afib w/ RVR>>cardioverted and diuresed w/ lasix gtt  - Echo 9/23 EF < 20%, RV severely reduced, tissue AoV ok  -He has been on milrinone 0.5 mcg. He is being worked for possible heart/kidney transplant.  - NYHA III. Mild volume overload. CXR - pulmonary edema and small right pleural effusion. BNP > 2500 . CXR with pleural effusion.  - Lactic Acid 3.2  - Continue milrinone 0.5 mcg. I let Carolynn Sayers know about his admit.  - Give 80 mg IV lasix twice a day.   - No bb with low output - GDMT limited by CKD Stage IIIb. - RHC tomorrow after a few doses of IV lasix. If hemodynamics concerning will need to consider  hospital to hospital transfer to [facility].   2. PAF - Most recent cardioversion 03/18/22. - EKG today -->  SR - Continue amio 200 mg daily  - Continue eliquis 5 mg twice a day   3. CKD Stage IIIB -Creatinine baseline 2-5-2.8  - Check BMET   4. S/P Tisssue AVR  H/O AV repair 2018 with  complex valvuloplasty including plication of left coronary leaflet and aortic ring annuloplasty -AVR [facility] 02/2022  -Echo 9/23 valve ok. mGrad 6 mmHg. No significant perivalvular leak   Admit to [facility]C. Plan RHC in am. If hemodynamics concerning will need hospital to hospital transfer. Dr Haroldine Laws will discuss with Dr Mosetta Pigeon at [facility].   of Stay: 0  Darrick Grinder, NP  04/02/2022, 3:47 PM  Advanced Heart Failure Team Pager [phone] (M-F; 7a - 5p)  Please contact [facility] for night-coverage after hours (4p -7a ) and weekends on [email or web address]  Patient seen and examined with the above-signed Advanced Practice Provider and/or Housestaff. I personally reviewed laboratory data, imaging studies and relevant notes. I independently examined the patient and formulated the important aspects of the plan. I have edited the note to reflect any of my changes or salient points. I have personally discussed the plan with the patient and/or family.  41 y/o male with h/o severe AI s/p AVR 8/81, severe systolic HF due to NICM with biventricular dysfunction on home milrinone, PAF, CKD 4. Recently admitted with cardiogenic shock and respiratory failure in the setting of AF with RVR. Has been doing ok at home NYHA IIIb symptoms. Over past 2 days more SOB + orthopnea/PND. Feels anxious. Wearing Lifevest.  In ER today volume overloaded with jvp to ear. CXR + pulmonary edema.Remains in NSR  General:  SOB. Weak appearing HEENT: normal Neck: supple. JVP to ear Carotids 2+ bilat; no bruits. No lymphadenopathy or thryomegaly appreciated. Cor: PMI laterally displaced. + RV lift  Regular rate & rhythm.+s3 Lungs: fine crackles Abdomen: soft, nontender, nondistended. No hepatosplenomegaly. No bruits or masses. Good bowel sounds. Extremities: no cyanosis, clubbing, rash, edema Neuro: alert & orientedx3, cranial nerves grossly intact. moves all 4 extremities w/o difficulty. Affect pleasant  He is very tenuous. Significantly volume  overloaded. Will admit for IV diurese. Treat HTN with hydralazine. He is scheduled for transplant eval at [facility] in early October. We will paln RHC tomorrow and if output low despite milrinone will consider inpatient transfer. D/w Dr. Mosetta Pigeon at [facility].   Glori Bickers, MD  10:32 PM

## 2022-04-02 NOTE — ED Notes (Signed)
ED TO INPATIENT HANDOFF REPORT  ED Nurse Name and Phone #: Mel Almond RN [place] Name/Age/Gender [name] [name] 41 y.o. male Room/Bed: 007C/007C  Code Status   Code Status: Full Code  Home/SNF/Other Home Patient oriented to: self, place, time, and situation Is this baseline? Yes   Triage Complete: Triage complete  Chief Complaint Acute on chronic systolic (congestive) heart failure ([place]) [I50.23]  Triage Note Complains of chest pain that started today but vomiting started Sunday.  Had open heart in august at [facility] on milrinone drip.     Allergies No Known Allergies  Level of Care/Admitting Diagnosis ED Disposition     ED Disposition  Admit   Condition  --   Comment  Hospital Area: [place] [[id number]]  Level of Care: Progressive [102]  Admit to Progressive based on following criteria: CARDIOVASCULAR & THORACIC of moderate stability with acute coronary syndrome symptoms/low risk myocardial infarction/hypertensive urgency/arrhythmias/heart failure potentially compromising stability and stable post cardiovascular intervention patients.  May admit patient to [facility] or [facility] if equivalent level of care is available:: No  Covid Evaluation: Asymptomatic - no recent exposure (last 10 days) testing not required  Diagnosis: Acute on chronic systolic (congestive) heart failure [facility]) [[id number]]  Admitting Physician: Jolaine Artist [[id number]]  Attending Physician: Jolaine Artist [[id number]]  Bed request comments: 2C  Certification:: I certify this patient will need inpatient services for at least 2 midnights  Estimated Length of Stay: 2          B Medical/Surgery History Past Medical History:  Diagnosis Date   Anxiety    CHF (congestive heart failure) ([place])    Chronic systolic heart failure ([place])    CKD (chronic kidney disease) stage 2, GFR 60-89 ml/min    Dyspnea    with value issues- "if I dont take my medication"   Essential hypertension,  benign    Headache    History of pneumonia    Mitral regurgitation    Moderate   Noncompliance    Nonischemic cardiomyopathy ([place])    Normal coronaries May 2012, LVEF < 20%   Pneumonia 2011   S/P aortic valve repair 05/11/2017   Complex valvuloplasty including plication of left coronary leaflet and 26mm Biostable HAART ring annuloplasty   Past Surgical History:  Procedure Laterality Date   AORTIC VALVE REPAIR N/A 05/11/2017   Procedure: AORTIC VALVE REPAIR;  Surgeon: Rexene Alberts, MD;  Location: [place];  Service: Open Heart Surgery;  Laterality: N/A;   CARDIAC SURGERY     IABP INSERTION N/A 02/19/2022   Procedure: IABP Insertion;  Surgeon: Jolaine Artist, MD;  Location: [place] CV LAB;  Service: Cardiovascular;  Laterality: N/A;   RIGHT HEART CATH N/A 02/19/2022   Procedure: RIGHT HEART CATH;  Surgeon: Jolaine Artist, MD;  Location: [place] CV LAB;  Service: Cardiovascular;  Laterality: N/A;   RIGHT/LEFT HEART CATH AND CORONARY ANGIOGRAPHY N/A 04/20/2017   Procedure: RIGHT/LEFT HEART CATH AND CORONARY ANGIOGRAPHY;  Surgeon: Jolaine Artist, MD;  Location: [place] CV LAB;  Service: Cardiovascular;  Laterality: N/A;   SURGERY SCROTAL / TESTICULAR     Testicular torsion   TEE WITHOUT CARDIOVERSION N/A 04/09/2017   Procedure: TRANSESOPHAGEAL ECHOCARDIOGRAM (TEE);  Surgeon: Jolaine Artist, MD;  Location: [facility] ENDOSCOPY;  Service: Cardiovascular;  Laterality: N/A;   TEE WITHOUT CARDIOVERSION N/A 05/11/2017   Procedure: TRANSESOPHAGEAL ECHOCARDIOGRAM (TEE);  Surgeon: Rexene Alberts, MD;  Location: [place];  Service: Open  Heart Surgery;  Laterality: N/A;   TEE WITHOUT CARDIOVERSION N/A 01/20/2022   Procedure: TRANSESOPHAGEAL ECHOCARDIOGRAM (TEE);  Surgeon: Jolaine Artist, MD;  Location: [facility] ENDOSCOPY;  Service: Cardiovascular;  Laterality: N/A;     A IV Location/Drains/Wounds Patient Lines/Drains/Airways Status     Active Line/Drains/Airways     Name  Placement date Placement time Site Days   PICC Double Lumen 95/62/13 Left Basilic 43 cm 0 cm 08/65/78  1615  -- 52   Incision (Closed) 03/13/22 Sternum 03/13/22  --  -- 20            Intake/Output Last 24 hours No intake or output data in the 24 hours ending 04/02/22 1736  Labs/Imaging Results for orders placed or performed during the hospital encounter of 04/02/22 (from the past 48 hour(s))  Basic metabolic panel     Status: Abnormal   Collection Time: 04/02/22  3:43 PM  Result Value Ref Range   Sodium 131 (L) 135 - 145 mmol/L   Potassium 4.6 3.5 - 5.1 mmol/L   Chloride 98 98 - 111 mmol/L   CO2 18 (L) 22 - 32 mmol/L   Glucose, Bld 162 (H) 70 - 99 mg/dL    Comment: Glucose reference range applies only to samples taken after fasting for at least 8 hours.   BUN 43 (H) 6 - 20 mg/dL   Creatinine, Ser 2.64 (H) 0.61 - 1.24 mg/dL   Calcium 9.7 8.9 - 10.3 mg/dL   GFR, Estimated 30 (L) >60 mL/min    Comment: (NOTE) Calculated using the CKD-EPI Creatinine Equation (2021)    Anion gap 15 5 - 15    Comment: Performed at [facility] [address], [place], [place] [address]  CBC     Status: Abnormal   Collection Time: 04/02/22  3:43 PM  Result Value Ref Range   WBC 7.4 4.0 - 10.5 K/uL   RBC 4.20 (L) 4.22 - 5.81 MIL/uL   Hemoglobin 11.7 (L) 13.0 - 17.0 g/dL   HCT 35.7 (L) 39.0 - 52.0 %   MCV 85.0 80.0 - 100.0 fL   MCH 27.9 26.0 - 34.0 pg   MCHC 32.8 30.0 - 36.0 g/dL   RDW 22.0 (H) 11.5 - 15.5 %   Platelets 262 150 - 400 K/uL   nRBC 0.8 (H) 0.0 - 0.2 %    Comment: Performed at [facility] [address] [place], [place] [address]  Troponin I (High Sensitivity)     Status: Abnormal   Collection Time: 04/02/22  3:43 PM  Result Value Ref Range   Troponin I (High Sensitivity) 132 (HH) <18 ng/L    Comment: CRITICAL RESULT CALLED TO, READ BACK BY AND VERIFIED WITH M GROSE,RN 1659 04/02/2022 WBOND (NOTE) Elevated high sensitivity troponin I (hsTnI) values and  significant  changes across serial measurements may suggest ACS but many other  chronic and acute conditions are known to elevate hsTnI results.  Refer to the "Links" section for chest pain algorithms and additional  guidance. Performed at [facility], [place] [address], [place], [place] [address]   Brain natriuretic peptide     Status: Abnormal   Collection Time: 04/02/22  3:43 PM  Result Value Ref Range   B Natriuretic Peptide 2,570.9 (H) 0.0 - 100.0 pg/mL    Comment: Performed at [facility] [address] [place], [place] [address]  Lactic acid, plasma     Status: Abnormal   Collection Time: 04/02/22  3:43 PM  Result Value Ref Range   Lactic Acid, Venous 3.2 (HH) 0.5 - 1.9 mmol/L    Comment: CRITICAL RESULT CALLED TO, READ BACK BY AND VERIFIED WITH K.YOUL, RN [phone] 09.21.23 MRIVET Performed at [facility], [place] [address] [place], [place] [address]    DG Chest 2 View  Result Date: 04/02/2022 CLINICAL DATA:  Chest pain EXAM: CHEST - 2 VIEW COMPARISON:  Previous studies including the examination of 03/20/2022 FINDINGS: Transverse diameter of heart is increased. Central pulmonary vessels are more prominent. Increased interstitial and alveolar markings are seen in parahilar regions and lower lung fields, more so on the right side. There is a small to moderate right pleural effusion with interval increase. There is no pneumothorax. IMPRESSION: Cardiomegaly. Central pulmonary vessels are more prominent suggesting CHF. Increased interstitial and alveolar markings are seen in parahilar regions and lower lung fields, more so on the right side suggesting asymmetric pulmonary edema or multifocal pneumonia. There is small to moderate right pleural effusion. Electronically Signed   By: Elmer Picker M.D.   On: 04/02/2022 15:57    Pending Labs Unresulted Labs (From admission, onward)     Start     Ordered   04/03/22 7867  Basic metabolic panel  Daily at 5am,   R      Comments: While on milrinone.    04/02/22 1722   04/03/22 0500  Cooxemetry Panel (carboxy, met, total hgb, O2 sat)  Daily at 5am,   R      04/02/22 1722   04/03/22 5449  Basic metabolic panel  Daily at 5am,   R     Comments: As Scheduled for 5 days    04/02/22 1722   04/03/22 2010  Basic metabolic panel  Daily at 5am,   R     Comments: While on milrinone.    04/02/22 1722   04/03/22 0500  Cooxemetry Panel (carboxy, met, total hgb, O2 sat)  Daily at 5am,   R      04/02/22 1722   04/02/22 2023  Cooxemetry Panel (carboxy, met, total hgb, O2 sat)  Once,   R        04/02/22 1722   04/02/22 2009  Cooxemetry Panel (carboxy, met, total hgb, O2 sat)  Once,   R        04/02/22 1722   04/02/22 1649  Hepatic function panel  Once,   URGENT        04/02/22 1648   04/02/22 1649  Lipase, blood  Once,   STAT        04/02/22 1648   04/02/22 1538  Lactic acid, plasma  Now then every 2 hours,   R (with STAT occurrences)      04/02/22 1537            Vitals/Pain Today's Vitals   04/02/22 1519 04/02/22 1645 04/02/22 1700 04/02/22 1735  BP:  (!) 133/111    Pulse:  95    Resp:  16    Temp:      TempSrc:      SpO2:  98%    Weight: 81.6 kg  80.6 kg   Height: $Remove'5\' 10"'mYcbaIS$  (1.778 m)     PainSc: 4    5     Isolation Precautions No active isolations  Medications Medications  furosemide (LASIX) injection 80 mg (has no administration in time range)  ondansetron (ZOFRAN) injection 4 mg (has no administration in time range)  metolazone (ZAROXOLYN) tablet 5 mg (  has no administration in time range)  hydrALAZINE (APRESOLINE) injection 10 mg (has no administration in time range)  sodium chloride flush (NS) 0.9 % injection 3 mL (has no administration in time range)  allopurinol (ZYLOPRIM) tablet 300 mg (has no administration in time range)  aspirin EC tablet 81 mg (has no administration in time range)  amiodarone (PACERONE) tablet 200 mg (has no administration in time range)  milrinone (PRIMACOR) 20  MG/100 ML (0.2 mg/mL) infusion (has no administration in time range)  milrinone (PRIMACOR) 20 MG/100 ML (0.2 mg/mL) infusion (has no administration in time range)  spironolactone (ALDACTONE) tablet 12.5 mg (has no administration in time range)  sertraline (ZOLOFT) tablet 50 mg (has no administration in time range)  apixaban (ELIQUIS) tablet 5 mg (has no administration in time range)  potassium chloride SA (KLOR-CON M) CR tablet 20 mEq (has no administration in time range)  sodium chloride flush (NS) 0.9 % injection 3 mL (has no administration in time range)  sodium chloride flush (NS) 0.9 % injection 3 mL (has no administration in time range)  0.9 %  sodium chloride infusion (has no administration in time range)  acetaminophen (TYLENOL) tablet 650 mg (has no administration in time range)  milrinone (PRIMACOR) 20 MG/100 ML (0.2 mg/mL) infusion (has no administration in time range)    Mobility walks Low fall risk   Focused Assessments Cardiac Assessment Handoff:  Cardiac Rhythm: Normal sinus rhythm Lab Results  Component Value Date   CKTOTAL 413 (H) 12/31/2011   CKMB 14.2 (HH) 12/31/2011   TROPONINI 0.08 (HH) 07/30/2017   Lab Results  Component Value Date   DDIMER 0.66 (H) 10/02/2020   Does the Patient currently have chest pain? Yes    R Recommendations: See Admitting Provider Note  Report given to:   Additional Notes: Home PICC line, IV team consulted for confirmation of placement, home Milrinone drip ordered. Pt also wearing a life-vest

## 2022-04-02 NOTE — ED Notes (Signed)
Pt ambulatory to the bathroom 

## 2022-04-02 NOTE — ED Notes (Signed)
Patient transported to X-ray 

## 2022-04-02 NOTE — ED Provider Notes (Signed)
[facility] EMERGENCY DEPARTMENT Provider Note   CSN: [id number] Arrival date & time: 04/02/22  1457     History  Chief Complaint  Patient presents with   Chest Pain    [name] [name] is a 41 y.o. male.  Patient is a 41 year old male with a past medical history of recent aortic valve replacement in August 2023 on Eliquis, nonischemic cardiomyopathy on milrinone through PICC line, and hypertension presenting to the emergency department with chest pain, nausea and vomiting.  Patient states that he felt nauseous and vomited once on Sunday.  He states that his symptoms then resolved and then his nausea returned today.  He states he has also been having intermittent chest pain.  He states that it feels like a sharp stabbing pain in his chest that lasts for a few seconds at a time.  He states that he has had increasing dyspnea on exertion and noticed swelling in his feet this morning.  Denies any chest pain on exertion.  He denies any associated abdominal pain, fevers or chills.  He states that he has had a nonproductive cough.  He denies any dysuria or hematuria but does report he has been constipated.  He denies any black or bloody stools.  The history is provided by the patient.  Chest Pain      Home Medications Prior to Admission medications   Medication Sig Start Date End Date Taking? Authorizing Provider  allopurinol (ZYLOPRIM) 300 MG tablet Take 300 mg by mouth daily. 01/13/22   [provider]  amiodarone (PACERONE) 200 MG tablet Take 1 tablet (200 mg total) by mouth daily. 03/23/22   Bensimhon, Shaune Pascal, MD  apixaban (ELIQUIS) 5 MG TABS tablet Take 1 tablet (5 mg total) by mouth 2 (two) times daily. 03/23/22   Bensimhon, Shaune Pascal, MD  aspirin EC 81 MG tablet Take 81 mg by mouth daily. Swallow whole.    [provider]  milrinone (PRIMACOR) 20 MG/100 ML SOLN infusion Inject 0.0486 mg/min into the vein continuous. 02/20/22   Clegg, Amy D, NP   polyethylene glycol (MIRALAX / GLYCOLAX) 17 g packet Take 17 g by mouth daily as needed for mild constipation. 03/10/22   [provider]  potassium chloride SA (KLOR-CON M) 20 MEQ tablet Take 1 tablet (20 mEq total) by mouth daily. 03/23/22   Joette Catching, PA-C  sertraline (ZOLOFT) 50 MG tablet Take 1 tablet (50 mg total) by mouth daily. 03/25/22   Bensimhon, Shaune Pascal, MD  spironolactone (ALDACTONE) 25 MG tablet Take 0.5 tablets (12.5 mg total) by mouth daily. 03/23/22 03/23/23  Joette Catching, PA-C  temazepam (RESTORIL) 30 MG capsule Take 1 capsule (30 mg total) by mouth at bedtime as needed for sleep. 03/23/22   Earnie Larsson, NP  torsemide (DEMADEX) 20 MG tablet Take 2 tablets (40 mg total) by mouth daily. Take another 2 tablets (40 mg) in the evening if you experience >3lbs weight gain in 1 week 03/23/22 04/22/22  Joette Catching, PA-C      Allergies    Patient has no known allergies.    Review of Systems   Review of Systems  Cardiovascular:  Positive for chest pain.    Physical Exam Updated Vital Signs BP (!) 133/111   Pulse 95   Temp 97.7 F (36.5 C) (Oral)   Resp 16   Ht '5\' 10"'$  (1.778 m)   Wt 81.6 kg   SpO2 98%   BMI 25.83 kg/m  Physical Exam Vitals and nursing note reviewed.  Constitutional:      General: He is not in acute distress.    Appearance: He is well-developed.  HENT:     Head: Normocephalic and atraumatic.  Cardiovascular:     Rate and Rhythm: Normal rate and regular rhythm.     Pulses:          Radial pulses are 2+ on the right side and 2+ on the left side.     Heart sounds: Murmur heard.  Pulmonary:     Effort: Pulmonary effort is normal.     Breath sounds: Normal breath sounds.  Chest:     Chest wall: No tenderness.     Comments: Midline chest scar well healing appearing Abdominal:     Palpations: Abdomen is soft.     Tenderness: There is no abdominal tenderness.  Musculoskeletal:        General: Normal range of motion.      Cervical back: Normal range of motion and neck supple.     Right lower leg: Edema (2+ edema to ankles) present.     Left lower leg: Edema (2+ edema to ankles) present.  Skin:    General: Skin is warm and dry.  Neurological:     General: No focal deficit present.     Mental Status: He is alert and oriented to person, place, and time.  Psychiatric:        Mood and Affect: Mood normal.        Behavior: Behavior normal.     ED Results / Procedures / Treatments   Labs (all labs ordered are listed, but only abnormal results are displayed) Labs Reviewed  BASIC METABOLIC PANEL - Abnormal; Notable for the following components:      Result Value   Sodium 131 (*)    CO2 18 (*)    Glucose, Bld 162 (*)    BUN 43 (*)    Creatinine, Ser 2.64 (*)    GFR, Estimated 30 (*)    All other components within normal limits  CBC - Abnormal; Notable for the following components:   RBC 4.20 (*)    Hemoglobin 11.7 (*)    HCT 35.7 (*)    RDW 22.0 (*)    nRBC 0.8 (*)    All other components within normal limits  BRAIN NATRIURETIC PEPTIDE - Abnormal; Notable for the following components:   B Natriuretic Peptide 2,570.9 (*)    All other components within normal limits  LACTIC ACID, PLASMA - Abnormal; Notable for the following components:   Lactic Acid, Venous 3.2 (*)    All other components within normal limits  TROPONIN I (HIGH SENSITIVITY) - Abnormal; Notable for the following components:   Troponin I (High Sensitivity) 132 (*)    All other components within normal limits  LACTIC ACID, PLASMA  HEPATIC FUNCTION PANEL  LIPASE, BLOOD  BASIC METABOLIC PANEL  COOXEMETRY PANEL  COOXEMETRY PANEL  COOXEMETRY PANEL  COOXEMETRY PANEL  TROPONIN I (HIGH SENSITIVITY)    EKG EKG Interpretation  Date/Time:  Thursday April 02 2022 15:15:45 EDT Ventricular Rate:  97 PR Interval:  236 QRS Duration: 130 QT Interval:  364 QTC Calculation: 462 R Axis:   5 Text Interpretation: Normal sinus  rhythm Possible Left atrial enlargement Non-specific intra-ventricular conduction block Cannot rule out Anterior infarct , age undetermined Abnormal ECG When compared with ECG of 25-Mar-2022 15:37, PREVIOUS ECG IS PRESENT Confirmed by Oneal Deputy [phone]) on 04/02/2022 4:04:50 PM  Radiology DG Chest 2 View  Result Date: 04/02/2022 CLINICAL DATA:  Chest pain EXAM: CHEST - 2 VIEW COMPARISON:  Previous studies including the examination of 03/20/2022 FINDINGS: Transverse diameter of heart is increased. Central pulmonary vessels are more prominent. Increased interstitial and alveolar markings are seen in parahilar regions and lower lung fields, more so on the right side. There is a small to moderate right pleural effusion with interval increase. There is no pneumothorax. IMPRESSION: Cardiomegaly. Central pulmonary vessels are more prominent suggesting CHF. Increased interstitial and alveolar markings are seen in parahilar regions and lower lung fields, more so on the right side suggesting asymmetric pulmonary edema or multifocal pneumonia. There is small to moderate right pleural effusion. Electronically Signed   By: Elmer Picker M.D.   On: 04/02/2022 15:57    Procedures Procedures    Medications Ordered in ED Medications  furosemide (LASIX) injection 80 mg (has no administration in time range)  ondansetron (ZOFRAN) injection 4 mg (has no administration in time range)  metolazone (ZAROXOLYN) tablet 5 mg (has no administration in time range)  hydrALAZINE (APRESOLINE) injection 10 mg (has no administration in time range)  sodium chloride flush (NS) 0.9 % injection 3 mL (has no administration in time range)  allopurinol (ZYLOPRIM) tablet 300 mg (has no administration in time range)  aspirin EC tablet 81 mg (has no administration in time range)  amiodarone (PACERONE) tablet 200 mg (has no administration in time range)  milrinone (PRIMACOR) 20 MG/100 ML (0.2 mg/mL) infusion (has no  administration in time range)  milrinone (PRIMACOR) 20 MG/100 ML (0.2 mg/mL) infusion (has no administration in time range)  spironolactone (ALDACTONE) tablet 12.5 mg (has no administration in time range)  sertraline (ZOLOFT) tablet 50 mg (has no administration in time range)  apixaban (ELIQUIS) tablet 5 mg (has no administration in time range)  potassium chloride SA (KLOR-CON M) CR tablet 20 mEq (has no administration in time range)  sodium chloride flush (NS) 0.9 % injection 3 mL (has no administration in time range)  sodium chloride flush (NS) 0.9 % injection 3 mL (has no administration in time range)  0.9 %  sodium chloride infusion (has no administration in time range)  acetaminophen (TYLENOL) tablet 650 mg (has no administration in time range)  milrinone (PRIMACOR) 20 MG/100 ML (0.2 mg/mL) infusion (has no administration in time range)    ED Course/ Medical Decision Making/ A&P Clinical Course as of 04/02/22 1732  Thu Apr 02, 2022  1731 Cardiology evaluated the patient at bedside and due to evidence of volume overload on his chest x-ray and labs, he will be admitted to the cardiology service.  His troponin is elevated to 132, EKG is without acute ischemic changes.  This is likely demand in the setting of CHF exacerbation or from recent surgery. [VK]    Clinical Course User Index [VK] Ottie Glazier, DO                           Medical Decision Making This patient presents to the ED with chief complaint(s) of nausea and chest pain with pertinent past medical history of recent aortic valve replacement, nonischemic cardiomyopathy on milrinone which further complicates the presenting complaint. The complaint involves an extensive differential diagnosis and also carries with it a high risk of complications and morbidity.    The differential diagnosis includes ACS, CHF exacerbation, PE unlikely as he has been compliant on his medication, postop infection, myocarditis, pericarditis,  pneumothorax  Additional history obtained: Additional history obtained from family Records reviewed previous admission documents  ED Course and Reassessment: Patient was initially evaluated by provider in triage who spoke with heart failure team that the patient follows with here they recommended work-up including troponin, BNP, lactate and chest x-ray to evaluate for cause of his nausea and shortness of breath.  He will additionally have LFTs and lipase to evaluate for pancreatitis, hepatitis.  He has no point tenderness on his abdomen on exam making intra-abdominal infection unlikely.  Independent labs interpretation:  The following labs were independently interpreted: Elevated lactate, elevated BNP, mildly elevated troponin  Independent visualization of imaging: - I independently visualized the following imaging with scope of interpretation limited to determining acute life threatening conditions related to emergency care: Chest x-ray, which revealed pulmonary edema  Consultation: - Consulted or discussed management/test interpretation w/ external professional: Heart failure  Consideration for admission or further workup: Patient work-up reveals that he is in acute heart failure exacerbation and will be admitted to the cardiology service Social Determinants of health: N/A    Amount and/or Complexity of Data Reviewed Labs: ordered. Radiology: ordered.  Risk Prescription drug management. Decision regarding hospitalization.           Final Clinical Impression(s) / ED Diagnoses Final diagnoses:  Acute on chronic congestive heart failure, unspecified heart failure type [facility])    Rx / DC Orders ED Discharge Orders     None         Ottie Glazier, DO 04/02/22 1732

## 2022-04-02 NOTE — ED Notes (Addendum)
PICC line placement on Chest XR confirmed by EDP and okayed to use PICC line

## 2022-04-02 NOTE — Progress Notes (Signed)
Received pt from ED to 2C05 via stretcher.  Pt alert and oriented.  Pt able to transfer from stretcher to bed without difficulty.  Pt oriented to room and plan of care.  Left PICC line infusing milrinone.  Dose verified.

## 2022-04-02 NOTE — ED Notes (Signed)
Pt has home Milrinone running admitting MD made aware, IV team consult in to verify PICC line

## 2022-04-02 NOTE — Progress Notes (Signed)
Pt requesting home sleeping medication.  Temazepam on home medication list.  Cardiology paged.  Waiting return call.

## 2022-04-02 NOTE — ED Triage Notes (Signed)
Complains of chest pain that started today but vomiting started Sunday.  Had open heart in august at [facility] on milrinone drip.

## 2022-04-02 NOTE — Progress Notes (Signed)
Co ox lab ordered.  Unable to draw from left upper arm PICC, either port.  Both ports flushed, caps changed, attempted multiple positions for blood draw.  Unable to get blood back.  IV Team consult placed for assessing PICC line for clots.

## 2022-04-02 NOTE — ED Provider Triage Note (Signed)
Emergency Medicine Provider Triage Evaluation Note  [name] [name] , a 41 y.o. male  was evaluated in triage.  Pt complains of chest pain onset today.  Patient had emesis that started on 6 days ago.  Notes has associated mild shortness of breath.  No radiation of his chest pain.  Review of Systems  Positive:  Negative:   Physical Exam  BP (!) 129/97 (BP Location: Right Arm)   Pulse 97   Temp 97.7 F (36.5 C) (Oral)   Resp 16   Ht '5\' 10"'$  (1.778 m)   Wt 81.6 kg   SpO2 98%   BMI 25.83 kg/m  Gen:   Awake, no distress   Resp:  Normal effort  MSK:   Moves extremities without difficulty  Other:    Medical Decision Making  Medically screening exam initiated at 3:26 PM.  Appropriate orders placed.  [name] was informed that the remainder of the evaluation will be completed by another provider, this initial triage assessment does not replace that evaluation, and the importance of remaining in the ED until their evaluation is complete.  3:28 PM NP and MD from Heart failure in to see the patient.   3:37 PM -after evaluation of patient by heart failure team consistent of Amy Cleggs, NP and MD, they note that patient overall looks well and will need a BNP, lactic, basic labs and chest x-ray and if that all looks well then patient could be DC'd.  3:40 PM - Discussed with RN that patient is in need of a room immediately. RN aware and working on room placement.      Graviel Payeur A, PA-C 04/02/22 1542

## 2022-04-03 ENCOUNTER — Encounter (HOSPITAL_COMMUNITY): Payer: Self-pay | Admitting: Internal Medicine

## 2022-04-03 ENCOUNTER — Encounter (HOSPITAL_COMMUNITY)
Admission: EM | Disposition: A | Discharge status: Other Acute Inpatient Hospital | Payer: Self-pay | Source: Home / Self Care | Attending: Internal Medicine

## 2022-04-03 DIAGNOSIS — I5023 Acute on chronic systolic (congestive) heart failure: Secondary | ICD-10-CM | POA: Diagnosis not present

## 2022-04-03 HISTORY — PX: RIGHT HEART CATH: CATH118263

## 2022-04-03 LAB — POCT I-STAT EG7
Acid-Base Excess: 0 mmol/L (ref 0.0–2.0)
Acid-Base Excess: 1 mmol/L (ref 0.0–2.0)
Bicarbonate: 23.7 mmol/L (ref 20.0–28.0)
Bicarbonate: 24.9 mmol/L (ref 20.0–28.0)
Calcium, Ion: 1.12 mmol/L — ABNORMAL LOW (ref 1.15–1.40)
Calcium, Ion: 1.16 mmol/L (ref 1.15–1.40)
HCT: 36 % — ABNORMAL LOW (ref 39.0–52.0)
HCT: 38 % — ABNORMAL LOW (ref 39.0–52.0)
Hemoglobin: 12.2 g/dL — ABNORMAL LOW (ref 13.0–17.0)
Hemoglobin: 12.9 g/dL — ABNORMAL LOW (ref 13.0–17.0)
O2 Saturation: 54 %
O2 Saturation: 55 %
Potassium: 3.8 mmol/L (ref 3.5–5.1)
Potassium: 3.9 mmol/L (ref 3.5–5.1)
Sodium: 131 mmol/L — ABNORMAL LOW (ref 135–145)
Sodium: 132 mmol/L — ABNORMAL LOW (ref 135–145)
TCO2: 25 mmol/L (ref 22–32)
TCO2: 26 mmol/L (ref 22–32)
pCO2, Ven: 33.3 mmHg — ABNORMAL LOW (ref 44–60)
pCO2, Ven: 34.6 mmHg — ABNORMAL LOW (ref 44–60)
pH, Ven: 7.46 — ABNORMAL HIGH (ref 7.25–7.43)
pH, Ven: 7.465 — ABNORMAL HIGH (ref 7.25–7.43)
pO2, Ven: 26 mmHg — CL (ref 32–45)
pO2, Ven: 27 mmHg — CL (ref 32–45)

## 2022-04-03 LAB — BASIC METABOLIC PANEL
Anion gap: 13 (ref 5–15)
BUN: 41 mg/dL — ABNORMAL HIGH (ref 6–20)
CO2: 22 mmol/L (ref 22–32)
Calcium: 9.4 mg/dL (ref 8.9–10.3)
Chloride: 96 mmol/L — ABNORMAL LOW (ref 98–111)
Creatinine, Ser: 2.51 mg/dL — ABNORMAL HIGH (ref 0.61–1.24)
GFR, Estimated: 32 mL/min — ABNORMAL LOW (ref >60)
Glucose, Bld: 116 mg/dL — ABNORMAL HIGH (ref 70–99)
Potassium: 3.8 mmol/L (ref 3.5–5.1)
Sodium: 131 mmol/L — ABNORMAL LOW (ref 135–145)

## 2022-04-03 LAB — COOXEMETRY PANEL
Carboxyhemoglobin: 1.9 % — ABNORMAL HIGH (ref 0.5–1.5)
Methemoglobin: 0.9 % (ref 0.0–1.5)
O2 Saturation: 60.6 %
Total hemoglobin: 10.6 g/dL — ABNORMAL LOW (ref 12.0–16.0)

## 2022-04-03 SURGERY — RIGHT HEART CATH
Anesthesia: LOCAL

## 2022-04-03 MED ORDER — HYDRALAZINE HCL 20 MG/ML IJ SOLN
10.0000 mg | INTRAMUSCULAR | Status: AC | PRN
  Administered 2022-04-03: 10 mg via INTRAVENOUS
  Filled 2022-04-03: qty 1

## 2022-04-03 MED ORDER — APIXABAN 5 MG PO TABS: 5.0000 mg | ORAL_TABLET | Freq: Two times a day (BID) | ORAL | Status: DC

## 2022-04-03 MED ORDER — LIDOCAINE HCL (PF) 1 % IJ SOLN
INTRAMUSCULAR | Status: DC | PRN
  Administered 2022-04-03: 2 mL

## 2022-04-03 MED ORDER — ACETAMINOPHEN 325 MG PO TABS: 650.0000 mg | ORAL_TABLET | ORAL | Status: DC | PRN

## 2022-04-03 MED ORDER — POTASSIUM CHLORIDE CRYS ER 20 MEQ PO TBCR
40.0000 meq | EXTENDED_RELEASE_TABLET | Freq: Once | ORAL | Status: AC
  Administered 2022-04-03: 40 meq via ORAL
  Filled 2022-04-03: qty 2

## 2022-04-03 MED ORDER — LABETALOL HCL 5 MG/ML IV SOLN: 10.0000 mg | INTRAVENOUS | Status: AC | PRN

## 2022-04-03 MED ORDER — SODIUM CHLORIDE 0.9% FLUSH: 3.0000 mL | INTRAVENOUS | Status: DC | PRN

## 2022-04-03 MED ORDER — LIDOCAINE HCL (PF) 1 % IJ SOLN
INTRAMUSCULAR | Status: AC
  Filled 2022-04-03: qty 30

## 2022-04-03 MED ORDER — ALTEPLASE 2 MG IJ SOLR
2.0000 mg | Freq: Once | INTRAMUSCULAR | Status: AC
  Administered 2022-04-03: 2 mg
  Filled 2022-04-03: qty 2

## 2022-04-03 MED ORDER — SODIUM CHLORIDE 0.9% FLUSH
3.0000 mL | Freq: Two times a day (BID) | INTRAVENOUS | Status: DC
  Administered 2022-04-03 – 2022-04-06 (×4): 3 mL via INTRAVENOUS

## 2022-04-03 MED ORDER — HEPARIN (PORCINE) IN NACL 1000-0.9 UT/500ML-% IV SOLN
INTRAVENOUS | Status: DC | PRN
  Administered 2022-04-03: 500 mL

## 2022-04-03 MED ORDER — ONDANSETRON HCL 4 MG/2ML IJ SOLN
4.0000 mg | Freq: Four times a day (QID) | INTRAMUSCULAR | Status: DC | PRN
  Administered 2022-04-04 – 2022-04-05 (×2): 4 mg via INTRAVENOUS
  Filled 2022-04-03 (×2): qty 2

## 2022-04-03 MED ORDER — SODIUM CHLORIDE 0.9 % IV SOLN: 250.0000 mL | INTRAVENOUS | Status: DC | PRN

## 2022-04-03 MED ORDER — HEPARIN (PORCINE) 25000 UT/250ML-% IV SOLN
1600.0000 [IU]/h | INTRAVENOUS | Status: DC
  Administered 2022-04-03 – 2022-04-05 (×4): 1600 [IU]/h via INTRAVENOUS
  Filled 2022-04-03 (×4): qty 250

## 2022-04-03 SURGICAL SUPPLY — 10 items
CATH BALLN WEDGE 5F 110CM (CATHETERS) IMPLANT
GUIDEWIRE .025 260CM (WIRE) IMPLANT
PACK CARDIAC CATHETERIZATION (CUSTOM PROCEDURE TRAY) ×2 IMPLANT
PROTECTION STATION PRESSURIZED (MISCELLANEOUS) ×1
SHEATH GLIDE SLENDER 4/5FR (SHEATH) IMPLANT
STATION PROTECTION PRESSURIZED (MISCELLANEOUS) IMPLANT
TRANSDUCER W/STOPCOCK (MISCELLANEOUS) ×2 IMPLANT
TUBING ART PRESS 72  MALE/FEM (TUBING) ×1
TUBING ART PRESS 72 MALE/FEM (TUBING) IMPLANT
WIRE EMERALD 3MM-J .025X260CM (WIRE) IMPLANT

## 2022-04-03 NOTE — Progress Notes (Signed)
ANTICOAGULATION CONSULT NOTE - Initial Consult  Pharmacy Consult for Heparin (Eliquis held) Indication: atrial fibrillation  No Known Allergies  Patient Measurements: Height: '5\' 10"'$  (177.8 cm) Weight: 79.6 kg (175 lb 7.8 oz) IBW/kg (Calculated) : 73 Heparin Dosing Weight: ~ 83 kg  Vital Signs: Temp: 97.9 F (36.6 C) (09/22 1106) Temp Source: Oral (09/22 1106) BP: 110/76 (09/22 1106) Pulse Rate: 93 (09/22 1106)  Labs: Recent Labs    04/02/22 1543 04/02/22 1730 04/03/22 0325 04/03/22 0931  HGB 11.7*  --   --  12.9*  12.2*  HCT 35.7*  --   --  38.0*  36.0*  PLT 262  --   --   --   CREATININE 2.64*  --  2.51*  --   TROPONINIHS 132* 123*  --   --     Estimated Creatinine Clearance: 40.4 mL/min (A) (by C-G formula based on SCr of 2.51 mg/dL (H)).   Medical History: Past Medical History:  Diagnosis Date   Anxiety    CHF (congestive heart failure) (HCC)    Chronic systolic heart failure (HCC)    CKD (chronic kidney disease) stage 2, GFR 60-89 ml/min    Dyspnea    with value issues- "if I dont take my medication"   Essential hypertension, benign    Headache    History of pneumonia    Mitral regurgitation    Moderate   Noncompliance    Nonischemic cardiomyopathy ([place])    Normal coronaries May 2012, LVEF < 20%   Pneumonia 2011   S/P aortic valve repair 05/11/2017   Complex valvuloplasty including plication of left coronary leaflet and 52m Biostable HAART ring annuloplasty    Medications:  Infusions:   sodium chloride     sodium chloride     heparin     milrinone 0.5 mcg/kg/min (04/03/22 1039)    Assessment: 41yo male with afib on chronic Eliquis.  [place]olding Eliquis today [place].  During his last admission pt required higher doses of heparin (2000 units/hr) to reach therapeutic levels.  Last dose of Eliquis taken this AM at 1030.  Goal of Therapy:  Heparin level 0.3-0.7  units/ml Monitor platelets by anticoagulation protocol: Yes   Plan:  Start heparin tonight at 10 pm without bolus. Check aPTT and heparin level 6 hrs after gtt starts. Daily heparin level, aPTT, and CBC.  JNevada Crane PRoylene Reason BCCP Clinical Pharmacist  04/03/2022 3:16 PM   [facility] listed on a[facility]

## 2022-04-03 NOTE — Plan of Care (Signed)
  Problem: Activity: Goal: Ability to tolerate increased activity will improve Outcome: Progressing   Problem: Respiratory: Goal: Ability to maintain a clear airway and adequate ventilation will improve Outcome: Progressing   Problem: Role Relationship: Goal: Method of communication will improve Outcome: Progressing   Problem: Education: Goal: Knowledge of General Education information will improve Description: Including pain rating scale, medication(s)/side effects and non-pharmacologic comfort measures Outcome: Progressing   Problem: Health Behavior/Discharge Planning: Goal: Ability to manage health-related needs will improve Outcome: Progressing   Problem: Activity: Goal: Risk for activity intolerance will decrease Outcome: Progressing   Problem: Nutrition: Goal: Adequate nutrition will be maintained Outcome: Progressing

## 2022-04-03 NOTE — Progress Notes (Signed)
IV team unable to get blood return on DL PICC.  Alteplase placed to ports.  Unable to draw back after 2 hours.

## 2022-04-03 NOTE — Progress Notes (Signed)
IV team attempting 2nd round of Alteplase.  IV team will recheck line at 6 a.m.

## 2022-04-03 NOTE — Discharge Summary (Incomplete)
Advanced Heart Failure Team  Discharge Summary   Patient ID: [name] [name] MRN: [id number], DOB/AGE: 08-10-1980 41 y.o. Admit date: 04/02/2022 D/C date:     04/06/2022   Primary Discharge Diagnoses:  Acute on Chronic Biventricular Heart Failure w/ Cardiogenic shock, NYHA Class IV Inotrope Dependent, on home milrinone   Secondary Discharge Diagnoses:  S/p AVR PAF  CKD, Stage IIIb   PMH/Hospital Course:   [name] [name] is a 41 year old with complicated cardiac history including severe biventricular CHF/NICM on home milrinone 0.5 mcg, severe aortic valve insufficiency s/p repair in 2018 followed by SAVR with 27 mm Inspiris valve [facility] on 02/2022, CKD Stage IIIb, and PAF. Upcoming transplant evaluation at [facility] 10/2-10/5/23 for consideration of heart/kidney.    Admitted 12/22/21 with A/C HFrEF. Diuresed with IV lasix. Wilder Glade stopped at patient's request. Echo showed EF down to 20-25%, severe AI . Had TEE 01/20/22 with severe central AI, EF 20%, and severe RV dysfunction with plan for CPX and transplant evaluation.    Admitted 02/09/22 with A/C HFrEF-->low output. Diuresed with IV lasix and placed on milrinone. Unable to wean milrinone and discharged on milrinone 0.375 mcg. Discharged on 02/12/22.    Admitted 02/19/22 with recurrent cardiogenic shock. Milrinone increased to 0.5 mcg and given IV lasix. Had poor response despite the addition of NE. Given referactory shock, he was taken to the cath lab for RHC/swan. Hemodynamics showed marked volume overload and low output. Decision was made to try IABP (despite severe AI). Developed respiratory failure requiring intubation and A fib RVR. Started on amio drip with chemical conversion. Transferred to [place] 02/20/22 for high risk AVR.    Admitted to [facility] 02/20/22. CT surgery consulted. Underwent AVR. Hospital course complicated by A fib RVR s/p DCCV on 02/26/22 with restoration of SR.  Discharged on 03/10/22 with LifeVest and home milrinone at 0.5 mcg. Had issues  getting discharge medications from pharmacy. Amio was picked up from the pharmacy on 03/12/22. He did not have eliquis.    Readmitted 03/13/22 to [facility] with cardiogenic shock and A fib RVR. Underwent emergent cardioversion. Diuresed with IV lasix and continued on milrinone. Pressors weaned.  2D echo w/ severe biventricular dysfunction, LVEF < 20%, RV severely reduced, bioprosthetic AoV was stable, mean gradient 6 mmHg, no significant perivalvular leak. Discharged on milrinone 0.5 mcg.     Saw Dr Haroldine Laws in the HF clinic on 03/25/22. Stable on 0.5 mcg of milrinone.    Initially he had been doing okay at home. Presented to ED on 9/21 with worsening dyspnea, orthopnea, PND X 2 days. BNP > 2500 and lactic acidosis. for . Milrinone continued at 0.5 mcg.   [place] 9/22 (while on 0.5 milrinone) demonstrated elevated filling pressures and preserved CO, CI 2.5, PAPi 2.5. He was diuresed with IV lasix. Scr stable at 2.4 on day of discharge. Given lactic acidosis on admission, suspect he is nearing point for advanced therapies. Dr. Haroldine Laws discussed with [facility]plant team today, transferring for w/u for possible heart/kidney transplant. He will be admitted under Dr. Erick Alley.   Please see below for hospital course by problem.   Hospital Course by Problem: 1. A/C Biventricular HF with cardiogenic shock - Echo ~20% in 2013. EF 45-50% in 2016. Suspect due to ongoing AI +/- HTN - s/p AoV repair 10/18.  - Echo 12/18 with EF 45-50% moderate AI - Echo (2019): EF 30-35% Mod AI.  - Echo (8/20): EF 45% (out of range for ICD) - Echo (3/21): EF 45%  moderate AI. LV dimensions stable (LVIDd 6.5cm) - Echo (10/22): EF 30-35% LV dilated. Mild-mod AI Mod-severe central [name] (LVIDd 6.0cm)  - Echo (6/23): EF 20-25%, severe biventricular dysfunction, mild to moderate [name], severe AI (V max  2.9 m/s, MG 16 mmHG.) - Admitted [facility] 8/23 w/ marked volume overload in the setting of cardiogenic shock. IABP placed, milrinone increased  0.5 mcg, and norepi started. Transferred to [facility] for transplant eval - At [facility], decision made to do tissue AVR 8/23. Did not get transplant. D/c home w/ milrinone + LifeVest  - Admitted 9/23 with a/c BiV failure w/ cardiogenic shock, in setting of afib w/ RVR>>cardioverted and diuresed w/ lasix gtt  - Echo 9/23 EF < 20%, RV severely reduced, tissue AoV ok  -He has been on milrinone 0.5 mcg. He is being worked for possible heart/kidney transplant at [place]. Admit with volume overload. BNP > 2500. Lactic Acid 3.2>2.9>1.8. - RHC 09/22 on 0.5 milrinone: RA 13, PCWP 32 ( v 40-45), Fick CI 2.5, PA sat 55%, PAPi 2.5 - CO-OX 61% this am on 0.5 milrinone.  - Diuresed 15 lb this admit with IV lasix CVP 10-11 today. Give 60 mg lasix IV BID. - No bb with low output - On hydralazine 10 TID. Add imdur 30 daily - Continue spiro 25 daily. Watch K (potassium 5.0). Potassium supplement discontinued.  - GDMT limited by CKD Stage IIIb.   2. PAF - Most recent cardioversion 03/18/22. - Maintaining SR  - Continue amio 200 mg daily  - On heparin gtt in anticipation of transfer to [facility] for transplant eval   3. CKD Stage IIIB - Creatinine baseline 2-5-2.8  - Cr stable 2.4 today   4. S/P Tisssue AVR  - H/O AV repair 2018 with complex valvuloplasty including plication of left coronary leaflet and aortic ring annuloplasty -AVR [facility] 02/2022  -Echo 9/23 valve ok with mean gradient 6 mmHg. No significant perivalvular leak    Cardiac Studies  RHC 9/22 on 0.5 of Milrinone  Findings:   RA = 13 RV = 63/17 PA = 65/32 (43) PCW = 32 (v = 40-45) Fick cardiac output/index = 5.0/2.5 (on milrinone 0.5 mcg/kg/min) PVR = 2.5 WU Ao sat = 88% PA sat = 54%, 55% PAPi = 2.5   Assessment: 1. Elevated filling pressures with preserved CO on milrinone  Discharge Weight Range: 180 lb >> 165 lb Discharge Vitals: Blood pressure (!) 117/91, pulse 92, temperature 98 F (36.7 C), temperature source Oral, resp. rate (!)  25, height '5\' 10"'$  (1.778 m), weight 75 kg, SpO2 98 %.  Labs: Lab Results  Component Value Date   WBC 8.1 04/06/2022   HGB 10.8 (L) 04/06/2022   HCT 33.7 (L) 04/06/2022   MCV 85.3 04/06/2022   PLT 225 04/06/2022    Recent Labs  Lab 04/05/22 1412 04/06/22 0434  NA  --  130*  K  --  5.0  CL  --  92*  CO2  --  22  BUN  --  38*  CREATININE  --  2.41*  CALCIUM  --  9.8  PROT 6.8  --   BILITOT 1.5*  --   ALKPHOS 84  --   ALT 16  --   AST 21  --   GLUCOSE  --  114*   Lab Results  Component Value Date   CHOL 118 02/09/2022   HDL 24 (L) 02/09/2022   LDLCALC 75 02/09/2022   TRIG 94 02/09/2022   BNP (  last 3 results) Recent Labs    03/13/22 1007 03/13/22 1410 04/02/22 1543  BNP 2,469.0* 3,264.6* 2,570.9*    ProBNP (last 3 results) No results for input(s): "PROBNP" in the last 8760 hours.   Discharge Medications   Allergies as of 04/06/2022   No Known Allergies      Medication List     STOP taking these medications    apixaban 5 MG Tabs tablet Commonly known as: ELIQUIS   polyethylene glycol 17 g packet Commonly known as: MIRALAX / GLYCOLAX   potassium chloride SA 20 MEQ tablet Commonly known as: KLOR-CON M   torsemide 20 MG tablet Commonly known as: DEMADEX       TAKE these medications    allopurinol 300 MG tablet Commonly known as: ZYLOPRIM Take 300 mg by mouth daily.   amiodarone 200 MG tablet Commonly known as: PACERONE Take 1 tablet (200 mg total) by mouth daily.   aspirin EC 81 MG tablet Take 81 mg by mouth daily. Swallow whole.   furosemide 10 MG/ML injection Commonly known as: LASIX Inject 6 mLs (60 mg total) into the vein 2 (two) times daily.   heparin 25000 UT/250ML infusion Inject 1,600 Units/hr into the vein continuous.   hydrALAZINE 10 MG tablet Commonly known as: APRESOLINE Take 1 tablet (10 mg total) by mouth every 8 (eight) hours.   isosorbide mononitrate 30 MG 24 hr tablet Commonly known as: IMDUR Take 1 tablet  (30 mg total) by mouth daily.   metoCLOPramide 5 MG/ML injection Commonly known as: REGLAN Inject 2 mLs (10 mg total) into the vein every 6 (six) hours.   milrinone 20 MG/100 ML Soln infusion Commonly known as: PRIMACOR Inject 0.0403 mg/min into the vein continuous. What changed:  how much to take how fast to infuse this   ondansetron 4 MG/2ML Soln injection Commonly known as: ZOFRAN Inject 2 mLs (4 mg total) into the vein every 6 (six) hours as needed for nausea.   promethazine 25 MG tablet Commonly known as: PHENERGAN Take 1 tablet (25 mg total) by mouth every 6 (six) hours as needed for nausea or vomiting.   sertraline 50 MG tablet Commonly known as: Zoloft Take 1 tablet (50 mg total) by mouth daily.   spironolactone 25 MG tablet Commonly known as: ALDACTONE Take 1 tablet (25 mg total) by mouth daily. What changed: how much to take   temazepam 30 MG capsule Commonly known as: RESTORIL Take 1 capsule (30 mg total) by mouth at bedtime as needed for sleep.        Disposition   The patient will be discharged in stable condition to home.      Duration of Discharge Encounter: Greater than 35 minutes   Signed, [facility], Garl Speigner N  04/06/2022, 10:35 AM  Patient seen and examined with the above-signed Advanced Practice Provider and/or Housestaff. I personally reviewed laboratory data, imaging studies and relevant notes. I independently examined the patient and formulated the important aspects of the plan. I have edited the note to reflect any of my changes or salient points. I have personally discussed the plan with the patient and/or family.  He remains very tenuous. Plan transfer to [facility] for possible dual organ (heart-kidney) transplant. Case d/w [facility]plant team. Greatly appreciate their care.  Glori Bickers, MD  11:20 AM

## 2022-04-03 NOTE — Progress Notes (Signed)
Heart Failure Navigator Progress Note  Assessed for Heart & Vascular TOC clinic readiness.  Patient does not meet criteria due to Advanced Heart Failure Team Dr. Haroldine Laws.    Earnestine Leys, BSN, Clinical cytogeneticist Only

## 2022-04-03 NOTE — Plan of Care (Signed)
  Problem: Education: Goal: Understanding of CV disease, CV risk reduction, and recovery process will improve Outcome: Progressing Goal: Individualized Educational Video(s) Outcome: Progressing   Problem: Clinical Measurements: Goal: Respiratory complications will improve Outcome: Progressing Goal: Cardiovascular complication will be avoided Outcome: Progressing   Problem: Activity: Goal: Risk for activity intolerance will decrease Outcome: Progressing   Problem: Nutrition: Goal: Adequate nutrition will be maintained Outcome: Progressing   Problem: Coping: Goal: Level of anxiety will decrease Outcome: Progressing   Problem: Elimination: Goal: Will not experience complications related to bowel motility Outcome: Progressing Goal: Will not experience complications related to urinary retention Outcome: Progressing   Problem: Pain Managment: Goal: General experience of comfort will improve Outcome: Progressing   Problem: Safety: Goal: Ability to remain free from injury will improve Outcome: Progressing   Problem: Education: Goal: Ability to demonstrate management of disease process will improve Outcome: Progressing Goal: Ability to verbalize understanding of medication therapies will improve Outcome: Progressing   Problem: Activity: Goal: Capacity to carry out activities will improve Outcome: Progressing   Problem: Cardiac: Goal: Ability to achieve and maintain adequate cardiopulmonary perfusion will improve Outcome: Progressing

## 2022-04-03 NOTE — Plan of Care (Signed)
  Problem: Activity: Goal: Ability to tolerate increased activity will improve Outcome: Progressing   Problem: Respiratory: Goal: Ability to maintain a clear airway and adequate ventilation will improve Outcome: Progressing   Problem: Role Relationship: Goal: Method of communication will improve Outcome: Progressing   Problem: Education: Goal: Understanding of CV disease, CV risk reduction, and recovery process will improve Outcome: Progressing Goal: Individualized Educational Video(s) Outcome: Progressing   Problem: Activity: Goal: Ability to return to baseline activity level will improve Outcome: Progressing   Problem: Cardiovascular: Goal: Ability to achieve and maintain adequate cardiovascular perfusion will improve Outcome: Progressing Goal: Vascular access site(s) Level 0-1 will be maintained Outcome: Progressing   Problem: Health Behavior/Discharge Planning: Goal: Ability to safely manage health-related needs after discharge will improve Outcome: Progressing   Problem: Education: Goal: Knowledge of General Education information will improve Description: Including pain rating scale, medication(s)/side effects and non-pharmacologic comfort measures Outcome: Progressing   Problem: Health Behavior/Discharge Planning: Goal: Ability to manage health-related needs will improve Outcome: Progressing   Problem: Clinical Measurements: Goal: Ability to maintain clinical measurements within normal limits will improve Outcome: Progressing Goal: Will remain free from infection Outcome: Progressing Goal: Diagnostic test results will improve Outcome: Progressing Goal: Respiratory complications will improve Outcome: Progressing Goal: Cardiovascular complication will be avoided Outcome: Progressing   Problem: Activity: Goal: Risk for activity intolerance will decrease Outcome: Progressing   Problem: Nutrition: Goal: Adequate nutrition will be maintained Outcome:  Progressing   Problem: Coping: Goal: Level of anxiety will decrease Outcome: Progressing   Problem: Elimination: Goal: Will not experience complications related to bowel motility Outcome: Progressing Goal: Will not experience complications related to urinary retention Outcome: Progressing   Problem: Pain Managment: Goal: General experience of comfort will improve Outcome: Progressing   Problem: Safety: Goal: Ability to remain free from injury will improve Outcome: Progressing   Problem: Skin Integrity: Goal: Risk for impaired skin integrity will decrease Outcome: Progressing   

## 2022-04-03 NOTE — Interval H&P Note (Signed)
History and Physical Interval Note:  04/03/2022 9:15 AM  [name] [name]  has presented today for surgery, with the diagnosis of cardiogenic shock  The various methods of treatment have been discussed with the patient and family. After consideration of risks, benefits and other options for treatment, the patient has consented to  Procedure(s): RIGHT HEART CATH (N/A) as a surgical intervention.  The patient's history has been reviewed, patient examined, no change in status, stable for surgery.  I have reviewed the patient's chart and labs.  Questions were answered to the patient's satisfaction.     Brett Darko

## 2022-04-03 NOTE — Progress Notes (Addendum)
Advanced Heart Failure Rounding Note  PCP-Cardiologist: Glori Bickers, MD   Subjective:    09/21: Admit with a/c CHF with low-output. Lactic acid 3.2>>2.9. BNP > 2,500.  Going for RHC this morning  CO-OX 61% on milrinone 0.5  CVP 16. Brisk diuresis overnight with IV lasix + 5 mg metolazone. Weight down 5 lb.   Scr stable at 2.5.  K 3.8 Na 131 CO2 18>22  Feeling better. Nausea and dyspnea resolved.     Objective:   Weight Range: 79.6 kg Body mass index is 25.18 kg/m.   Vital Signs:   Temp:  [97.2 F (36.2 C)-97.9 F (36.6 C)] 97.9 F (36.6 C) (09/22 0323) Pulse Rate:  [92-97] 93 (09/22 0323) Resp:  [16-20] 19 (09/22 0323) BP: (113-136)/(86-111) 113/86 (09/22 0323) SpO2:  [95 %-98 %] 97 % (09/22 0323) Weight:  [79.6 kg-81.8 kg] 79.6 kg (09/22 0323) Last BM Date : 04/02/22  Weight change: Filed Weights   04/02/22 1700 04/02/22 1953 04/03/22 0323  Weight: 80.6 kg 81.8 kg 79.6 kg    Intake/Output:   Intake/Output Summary (Last 24 hours) at 04/03/2022 0709 Last data filed at 04/03/2022 0631 Gross per 24 hour  Intake --  Output 3700 ml  Net -3700 ml      Physical Exam    General:  Lying comfortably in bed. HEENT: Normal Neck: Supple. JVP to ear. Carotids 2+ bilat; no bruits. Cor: PMI nondisplaced. Regular rate & rhythm. No rubs, gallops or murmurs. Lungs: Clear Abdomen: Soft, nontender, nondistended.  Extremities: No cyanosis, clubbing, rash, edema, + LUE PICC Neuro: Alert & orientedx3, cranial nerves grossly intact. moves all 4 extremities w/o difficulty. Affect pleasant   Telemetry   SR 90s   Labs    CBC Recent Labs    04/02/22 1543  WBC 7.4  HGB 11.7*  HCT 35.7*  MCV 85.0  PLT 378   Basic Metabolic Panel Recent Labs    04/02/22 1543 04/03/22 0325  NA 131* 131*  K 4.6 3.8  CL 98 96*  CO2 18* 22  GLUCOSE 162* 116*  BUN 43* 41*  CREATININE 2.64* 2.51*  CALCIUM 9.7 9.4   Liver Function Tests Recent Labs     04/02/22 1730  AST 23  ALT 17  ALKPHOS 90  BILITOT 1.8*  PROT 7.3  ALBUMIN 3.5   Recent Labs    04/02/22 1730  LIPASE 45   Cardiac Enzymes No results for input(s): "CKTOTAL", "CKMB", "CKMBINDEX", "TROPONINI" in the last 72 hours.  BNP: BNP (last 3 results) Recent Labs    03/13/22 1007 03/13/22 1410 04/02/22 1543  BNP 2,469.0* 3,264.6* 2,570.9*    ProBNP (last 3 results) No results for input(s): "PROBNP" in the last 8760 hours.   D-Dimer No results for input(s): "DDIMER" in the last 72 hours. Hemoglobin A1C No results for input(s): "HGBA1C" in the last 72 hours. Fasting Lipid Panel No results for input(s): "CHOL", "HDL", "LDLCALC", "TRIG", "CHOLHDL", "LDLDIRECT" in the last 72 hours. Thyroid Function Tests No results for input(s): "TSH", "T4TOTAL", "T3FREE", "THYROIDAB" in the last 72 hours.  Invalid input(s): "FREET3"  Other results:   Imaging    DG Chest 2 View  Result Date: 04/02/2022 CLINICAL DATA:  Chest pain EXAM: CHEST - 2 VIEW COMPARISON:  Previous studies including the examination of 03/20/2022 FINDINGS: Transverse diameter of heart is increased. Central pulmonary vessels are more prominent. Increased interstitial and alveolar markings are seen in parahilar regions and lower lung fields, more so on the right side.  There is a small to moderate right pleural effusion with interval increase. There is no pneumothorax. IMPRESSION: Cardiomegaly. Central pulmonary vessels are more prominent suggesting CHF. Increased interstitial and alveolar markings are seen in parahilar regions and lower lung fields, more so on the right side suggesting asymmetric pulmonary edema or multifocal pneumonia. There is small to moderate right pleural effusion. Electronically Signed   By: Elmer Picker M.D.   On: 04/02/2022 15:57     Medications:     Scheduled Medications:  allopurinol  300 mg Oral Daily   amiodarone  200 mg Oral Daily   apixaban  5 mg Oral BID   aspirin  EC  81 mg Oral Daily   Chlorhexidine Gluconate Cloth  6 each Topical Daily   furosemide  80 mg Intravenous BID   potassium chloride SA  20 mEq Oral Daily   sertraline  50 mg Oral Daily   sodium chloride flush  10-40 mL Intracatheter Q12H   sodium chloride flush  3 mL Intravenous Q12H   sodium chloride flush  3 mL Intravenous Q12H   spironolactone  12.5 mg Oral Daily    Infusions:  sodium chloride     sodium chloride 10 mL/hr at 04/03/22 0631   sodium chloride     milrinone 0.5 mcg/kg/min (04/03/22 0325)    PRN Medications: sodium chloride, sodium chloride, acetaminophen, sodium chloride flush, sodium chloride flush, sodium chloride flush, temazepam    Patient Profile   [name] [name] is a 41 year old with complicated medical history including severe biventricular CHF/NICM on milrinone 0.5 mcg, severe aortic valve insufficiency s/p repair in 2018 followed by SAVR with 27 mm Inspiris valve at [facility] on 02/22/22, CKD IIIb, and PAF. Has upcoming transplant eval for heart/kidney at [facility].   Admitted with a/c biventricular HF.   Assessment/Plan   1. A/C Biventricular HF - Echo ~20% in 2013. EF 45-50% in 2016. Suspect due to ongoing AI +/- HTN - s/p AoV repair 10/18.  - Echo 12/18 with EF 45-50% moderate AI - Echo (2019): EF 30-35% Mod AI.  - Echo (8/20): EF 45% (out of range for ICD) - Echo (3/21): EF 45% moderate AI. LV dimensions stable (LVIDd 6.5cm) - Echo (10/22): EF 30-35% LV dilated. Mild-mod AI Mod-severe central MR (LVIDd 6.0cm)  - Echo (6/23): EF 20-25%, severe biventricular dysfunction, mild to moderate MR, severe AI (V max  2.9 m/s, MG 16 mmHG.) - Admitted [facility] 8/23 w/ marked volume overload in the setting of cardiogenic shock. IABP placed, milrinone increased 0.5 mcg, and norepi started. Transferred to [facility] for transplant eval - At [facility], decision made to do tissue AVR 8/23. Did not get transplant. D/c home w/ milrinone + LifeVest  - Admitted 9/23 with a/c BiV failure w/  cardiogenic shock, in setting of afib w/ RVR>>cardioverted and diuresed w/ lasix gtt  - Echo 9/23 EF < 20%, RV severely reduced, tissue AoV ok  -He has been on milrinone 0.5 mcg. He is being worked for possible heart/kidney transplant.  - NYHA III. Admit with volume overload. CXR - pulmonary edema and small right pleural effusion. BNP > 2500. - Lactic Acid 3.2>2.9 - Continue milrinone 0.5 mcg. CO-OX stable 61%.  - CVP 16. Brisk diuresis overnight with IV lasix + metolazone. Continue diuresis. Supp K.  - No bb with low output - GDMT limited by CKD Stage IIIb. - RHC this am. If hemodynamics concerning will need to consider hospital to hospital transfer to [facility].    2. PAF -  Most recent cardioversion 03/18/22. - Maintaining SR  - Continue amio 200 mg daily  - eliquis held overnight for cath. Heparin or eliquis post cath depending on plan.  3. CKD Stage IIIB - Creatinine baseline 2-5-2.8  - Cr stable 2.5 today   4. S/P Tisssue AVR  H/O AV repair 2018 with complex valvuloplasty including plication of left coronary leaflet and aortic ring annuloplasty -AVR [facility] 02/2022  -Echo 9/23 valve ok with mean gradient 6 mmHg. No significant perivalvular leak     Home infusion rep for home inotrope, Carolynn Sayers, RN, aware of admission.    Length of Stay: 1  FINCH, LINDSAY N, PA-C  04/03/2022, 7:09 AM  Advanced Heart Failure Team Pager [phone] (M-F; 7a - 5p)  Please contact [facility] for night-coverage after hours (5p -7a ) and weekends on [email or web address]  Agree with above. Symptoms and lactic acidosis improving with diuresis. Breathing better. No orthopnea or PND. Maintaining NSR  RHC today   RA = 13 RV = 63/17 PA = 65/32 (43) PCW = 32 (v = 40-45) Fick cardiac output/index = 5.0/2.5 (on milrinone 0.5 mcg/kg/min) PVR = 2.5 WU Ao sat = 88% PA sat = 54%, 55% PAPi = 2.5   General:  Weak appearing. No resp difficulty HEENT: normal Neck: supple. JVP to ear Carotids 2+ bilat; no bruits.  No lymphadenopathy or thryomegaly appreciated. Cor: PMI nondisplaced. Regulart achy + RV lift + s3 Lungs: clear Abdomen: soft, nontender, nondistended. No hepatosplenomegaly. No bruits or masses. Good bowel sounds. Extremities: no cyanosis, clubbing, rash, edema Neuro: alert & orientedx3, cranial nerves grossly intact. moves all 4 extremities w/o difficulty. Affect pleasant  Continue IV diuresis and home milrinone.  Given lactic acidosis on admission, suspect he is nearing point for advanced therapies. Will discuss timing of transfer with [facility] Transplant team. Suspect we can keep him here over weekend for further diuresis and potentially transfer on Monday. Will need to switch apixaban to heparin soon.   CRITICAL CARE Performed by: Glori Bickers  Total critical care time: 45 minutes  Critical care time was exclusive of separately billable procedures and treating other patients.  Critical care was necessary to treat or prevent imminent or life-threatening deterioration.  Critical care was time spent personally by me (independent of midlevel providers or residents) on the following activities: development of treatment plan with patient and/or surrogate as well as nursing, discussions with consultants, evaluation of patient's response to treatment, examination of patient, obtaining history from patient or surrogate, ordering and performing treatments and interventions, ordering and review of laboratory studies, ordering and review of radiographic studies, pulse oximetry and re-evaluation of patient's condition.  Glori Bickers, MD  9:48 AM

## 2022-04-03 NOTE — Progress Notes (Signed)
Mobility Specialist - Progress Note   04/03/22 1519  Mobility  Activity Ambulated independently in hallway  Level of Assistance Independent  Assistive Device None  Distance Ambulated (ft) 800 ft  Activity Response Tolerated well  $Mobility charge 1 Mobility   Pt received in bed and agreeable. No c/o pain throughout. Pt returned to EOB with all needs met   Franki Monte  Mobility Specialist

## 2022-04-04 LAB — APTT
aPTT: 68 seconds — ABNORMAL HIGH (ref 24–36)
aPTT: >200 seconds (ref 24–36)

## 2022-04-04 LAB — BASIC METABOLIC PANEL
Anion gap: 13 (ref 5–15)
BUN: 40 mg/dL — ABNORMAL HIGH (ref 6–20)
CO2: 26 mmol/L (ref 22–32)
Calcium: 9.4 mg/dL (ref 8.9–10.3)
Chloride: 91 mmol/L — ABNORMAL LOW (ref 98–111)
Creatinine, Ser: 2.38 mg/dL — ABNORMAL HIGH (ref 0.61–1.24)
GFR, Estimated: 34 mL/min — ABNORMAL LOW (ref >60)
Glucose, Bld: 110 mg/dL — ABNORMAL HIGH (ref 70–99)
Potassium: 3.3 mmol/L — ABNORMAL LOW (ref 3.5–5.1)
Sodium: 130 mmol/L — ABNORMAL LOW (ref 135–145)

## 2022-04-04 LAB — CBC
HCT: 32.8 % — ABNORMAL LOW (ref 39.0–52.0)
Hemoglobin: 10.6 g/dL — ABNORMAL LOW (ref 13.0–17.0)
MCH: 27.5 pg (ref 26.0–34.0)
MCHC: 32.3 g/dL (ref 30.0–36.0)
MCV: 85.2 fL (ref 80.0–100.0)
Platelets: 217 10*3/uL (ref 150–400)
RBC: 3.85 MIL/uL — ABNORMAL LOW (ref 4.22–5.81)
RDW: 21.4 % — ABNORMAL HIGH (ref 11.5–15.5)
WBC: 6.5 10*3/uL (ref 4.0–10.5)
nRBC: 0 % (ref 0.0–0.2)

## 2022-04-04 LAB — COOXEMETRY PANEL
Carboxyhemoglobin: 2.3 % — ABNORMAL HIGH (ref 0.5–1.5)
Methemoglobin: 0.7 % (ref 0.0–1.5)
O2 Saturation: 65.2 %
Total hemoglobin: 10.1 g/dL — ABNORMAL LOW (ref 12.0–16.0)

## 2022-04-04 LAB — HEPARIN LEVEL (UNFRACTIONATED): Heparin Unfractionated: >1.1 IU/mL — ABNORMAL HIGH (ref 0.30–0.70)

## 2022-04-04 LAB — MAGNESIUM: Magnesium: 1.9 mg/dL (ref 1.7–2.4)

## 2022-04-04 MED ORDER — FUROSEMIDE 10 MG/ML IJ SOLN: 80.0000 mg | Freq: Every day | INTRAMUSCULAR | Status: DC

## 2022-04-04 MED ORDER — MAGNESIUM SULFATE 2 GM/50ML IV SOLN
2.0000 g | Freq: Once | INTRAVENOUS | Status: AC
  Administered 2022-04-04: 2 g via INTRAVENOUS
  Filled 2022-04-04: qty 50

## 2022-04-04 MED ORDER — FUROSEMIDE 10 MG/ML IJ SOLN: 40.0000 mg | Freq: Once | INTRAMUSCULAR | Status: DC

## 2022-04-04 MED ORDER — POTASSIUM CHLORIDE CRYS ER 20 MEQ PO TBCR
40.0000 meq | EXTENDED_RELEASE_TABLET | ORAL | Status: DC
  Administered 2022-04-04: 40 meq via ORAL
  Filled 2022-04-04: qty 2

## 2022-04-04 MED ORDER — FUROSEMIDE 10 MG/ML IJ SOLN
40.0000 mg | Freq: Two times a day (BID) | INTRAMUSCULAR | Status: DC
  Administered 2022-04-04: 40 mg via INTRAVENOUS
  Filled 2022-04-04 (×2): qty 4

## 2022-04-04 MED ORDER — POTASSIUM CHLORIDE CRYS ER 20 MEQ PO TBCR
40.0000 meq | EXTENDED_RELEASE_TABLET | Freq: Every day | ORAL | Status: DC
  Administered 2022-04-04 – 2022-04-05 (×2): 40 meq via ORAL
  Filled 2022-04-04 (×3): qty 2

## 2022-04-04 MED ORDER — POTASSIUM CHLORIDE CRYS ER 20 MEQ PO TBCR: 40.0000 meq | EXTENDED_RELEASE_TABLET | ORAL | Status: DC

## 2022-04-04 MED ORDER — SPIRONOLACTONE 25 MG PO TABS
25.0000 mg | ORAL_TABLET | Freq: Every day | ORAL | Status: DC
  Administered 2022-04-05 – 2022-04-06 (×2): 25 mg via ORAL
  Filled 2022-04-04 (×2): qty 1

## 2022-04-04 NOTE — TOC Initial Note (Signed)
Transition of Care [facility]) - Initial/Assessment Note    Patient Details  Name: [name] [name] MRN: [id number] Date of Birth: 12/29/1980  Transition of Care [facility] CM/SW Contact:    Erenest Rasher, RN Phone Number:[phone] 04/04/2022, 12:55 PM  Clinical Narrative:                 HF TOC CM spoke to pt and pt is active with [facility] and [facility] for Home IV Milrinone. Will notify [facility] agency that pt is currently in hospital. Will continue to follow for dc needs.   Expected Discharge Plan: Home/Self Care Barriers to Discharge: Continued Medical Work up   Patient Goals and CMS Choice        Expected Discharge Plan and Services Expected Discharge Plan: Home/Self Care   Discharge Planning Services: CM Consult   Living arrangements for the past 2 months: Single Family Home                           HH Arranged: RN [place] Agency: [place] ([facility]), [facility] Date HH Agency Contacted: 04/04/22 Time [place]: [phone] Representative spoke with at [place]: Caryl Pina rep [facility], Pam rep [facility]  Prior Living Arrangements/Services Living arrangements for the past 2 months: Single Family Home Lives with:: Spouse Patient language and need for interpreter reviewed:: Yes Do you feel safe going back to the place where you live?: Yes      Need for Family Participation in Patient Care: Yes (Comment) Care giver support system in place?: Yes (comment) Current home services: DME, Home RN (scale) Criminal Activity/Legal Involvement Pertinent to Current Situation/Hospitalization: No - Comment as needed  Activities of Daily Living Home Assistive Devices/Equipment: Cane (specify quad or straight), Scales, Other (Comment) (life vest, home milrinone pump) ADL Screening (condition at time of admission) Patient's cognitive ability adequate to safely complete daily activities?: Yes Is the patient deaf or have difficulty hearing?: No Does the patient  have difficulty seeing, even when wearing glasses/contacts?: No Does the patient have difficulty concentrating, remembering, or making decisions?: No Patient able to express need for assistance with ADLs?: Yes Does the patient have difficulty dressing or bathing?: No Independently performs ADLs?: Yes (appropriate for developmental age) Does the patient have difficulty walking or climbing stairs?: Yes Weakness of Legs: Both Weakness of Arms/Hands: Both  Permission Sought/Granted Permission sought to share information with : Case Manager, PCP, Family Supports Permission granted to share information with : Yes, Verbal Permission Granted  Share Information with NAME: Ancelmo Hunt  Permission granted to share info w AGENCY: [place] granted to share info w Relationship: wife  Permission granted to share info w Contact Information: [phone]  Emotional Assessment Appearance:: Appears stated age Attitude/Demeanor/Rapport: Engaged Affect (typically observed): Accepting Orientation: : Oriented to Self, Oriented to Place, Oriented to  Time, Oriented to Situation   Psych Involvement: No (comment)  Admission diagnosis:  Acute on chronic systolic (congestive) heart failure (HCC) [I50.23] Acute on chronic congestive heart failure, unspecified heart failure type [facility]) [I50.9] Patient Active Problem List   Diagnosis Date Noted   Acute on chronic systolic (congestive) heart failure ([place]) 04/02/2022   Hyponatremia    Pleural effusion    Biventricular heart failure ([place])    Pneumonia due to infectious organism    Acute respiratory failure with hypoxia (HCC)    Acute pulmonary edema (HCC)    Acute on chronic combined systolic and diastolic heart  failure ([place]) 02/19/2022   Sinus tachycardia 02/19/2022   Diabetes mellitus with complication ([place]) 01/75/1025   Cardiogenic shock ([place])    Heart failure with acute decompensation, type unknown ([place]) 02/09/2022   CHF exacerbation  ([place]) 02/08/2022   Inguinal hernia 02/08/2022   Constipation 02/08/2022   Anasarca    Acute heart failure reduced EF  12/23/2021   S/P aortic valve repair 05/11/2017   Aortic valve regurgitation    Chronic systolic CHF (congestive heart failure) ([place])    Hypertensive emergency 05/30/2015   Essential hypertension 11/22/2014   Shift work sleep disorder 10/30/2014   Obstructive sleep apnea 10/30/2014   Hypertensive crisis 09/26/2014   Hypertensive urgency 06/13/2013   CHF (congestive heart failure) ([place]) 06/13/2013   AKI on CKD3 06/13/2013   Nonischemic cardiomyopathy ([place]) 06/13/2013   HTN (hypertension) 02/25/2012   Chronic combined systolic and diastolic heart failure ([place]) 01/08/2012   Elevated troponin 01/01/2012   Acute on chronic combined systolic and diastolic CHF (congestive heart failure) ([place]) 12/31/2011   Hypokalemia 12/31/2011   Chest pain 12/31/2011   AKI (acute kidney injury) ([place]) 12/31/2011   PCP:  Sharilyn Sites, MD Pharmacy:   [place], [place] - [address] [place] [address] Phone: [phone] Fax: [phone]  [facility] [address] [place] [address] Phone: [phone] Fax: [phone]     Social Determinants of Health (SDOH) Interventions    Readmission Risk Interventions    03/20/2022    3:24 PM  Readmission Risk Prevention Plan  Transportation Screening Complete  PCP or Specialist Appt within 3-5 Days Complete  HRI or [place] Complete  Social Work Consult for [place] Planning/Counseling Complete  Palliative Care Screening Not Applicable  Medication Review Press photographer) Referral to Pharmacy

## 2022-04-04 NOTE — Progress Notes (Addendum)
Advanced Heart Failure Rounding Note  PCP-Cardiologist: Glori Bickers, MD   Subjective:    RHC yesterday w/ RA 13, PCW 32 with large V waves & PA sat of 55%. Since that time he has had roughly 6L of urine output. Mixed venous this AM of 65. Reports significant improvement in dyspnea, abdominal fullness; able to tolerate PO diet today. CVP down.   Objective:   Weight Range: 74.8 kg Body mass index is 23.66 kg/m.   Vital Signs:   Temp:  [97.9 F (36.6 C)-98.3 F (36.8 C)] 98.2 F (36.8 C) (09/23 0840) Pulse Rate:  [90-95] 94 (09/23 0840) Resp:  [18-27] 27 (09/23 0840) BP: (105-124)/(86-93) 105/90 (09/23 0840) SpO2:  [92 %-95 %] 93 % (09/23 0840) Weight:  [74.8 kg] 74.8 kg (09/23 0552) Last BM Date : 04/02/22  Weight change: Filed Weights   04/02/22 1953 04/03/22 0323 04/04/22 0552  Weight: 81.8 kg 79.6 kg 74.8 kg    Intake/Output:   Intake/Output Summary (Last 24 hours) at 04/04/2022 1133 Last data filed at 04/04/2022 1105 Gross per 24 hour  Intake 617.22 ml  Output 6050 ml  Net -5432.78 ml       Physical Exam    General:  Lying comfortably in bed. HEENT: Normal Neck: Supple. JVP 8-9cm  Cor: PMI nondisplaced. Regular rate & rhythm.  Lungs: some inspiratory crackles at the bases Abdomen: Soft, nontender, nondistended.  Extremities: No cyanosis, clubbing, rash, edema, + LUE PICC Neuro: Alert & orientedx3, cranial nerves grossly intact. moves all 4 extremities w/o difficulty. Affect pleasant   Telemetry   SR 90s   Labs    CBC Recent Labs    04/02/22 1543 04/03/22 0931 04/04/22 0550  WBC 7.4  --  6.5  HGB 11.7* 12.9*  12.2* 10.6*  HCT 35.7* 38.0*  36.0* 32.8*  MCV 85.0  --  85.2  PLT 262  --  591    Basic Metabolic Panel Recent Labs    04/03/22 0325 04/03/22 0931 04/04/22 0550  NA 131* 131*  132* 130*  K 3.8 3.9  3.8 3.3*  CL 96*  --  91*  CO2 22  --  26  GLUCOSE 116*  --  110*  BUN 41*  --  40*  CREATININE 2.51*  --  2.38*   CALCIUM 9.4  --  9.4  MG  --   --  1.9    Liver Function Tests Recent Labs    04/02/22 1730  AST 23  ALT 17  ALKPHOS 90  BILITOT 1.8*  PROT 7.3  ALBUMIN 3.5    Recent Labs    04/02/22 1730  LIPASE 45   : BNP (last 3 results) Recent Labs    03/13/22 1007 03/13/22 1410 04/02/22 1543  BNP 2,469.0* 3,264.6* 2,570.9*    Imaging   - Echo ~20% in 2013. EF 45-50% in 2016. Suspect due to ongoing AI +/- HTN - s/p AoV repair 10/18.  - Echo 12/18 with EF 45-50% moderate AI - Echo (2019): EF 30-35% Mod AI.  - Echo (8/20): EF 45% (out of range for ICD) - Echo (3/21): EF 45% moderate AI. LV dimensions stable (LVIDd 6.5cm) - Echo (10/22): EF 30-35% LV dilated. Mild-mod AI Mod-severe central MR (LVIDd 6.0cm)  - Echo (6/23): EF 20-25%, severe biventricular dysfunction, mild to moderate MR, severe AI (V max  2.9 m/s, MG 16 mmHG.)  Medications:     Scheduled Medications:  allopurinol  300 mg Oral Daily   amiodarone  200 mg Oral Daily   aspirin EC  81 mg Oral Daily   Chlorhexidine Gluconate Cloth  6 each Topical Daily   furosemide  40 mg Intravenous Once   [START ON 04/05/2022] furosemide  80 mg Intravenous Daily   potassium chloride  40 mEq Oral Daily   sertraline  50 mg Oral Daily   sodium chloride flush  10-40 mL Intracatheter Q12H   sodium chloride flush  3 mL Intravenous Q12H   sodium chloride flush  3 mL Intravenous Q12H   sodium chloride flush  3 mL Intravenous Q12H   [START ON 04/05/2022] spironolactone  25 mg Oral Daily    Infusions:  sodium chloride     sodium chloride     heparin 1,600 Units/hr (04/04/22 0556)   milrinone 0.5 mcg/kg/min (04/04/22 1105)    PRN Medications: sodium chloride, sodium chloride, acetaminophen, acetaminophen, ondansetron (ZOFRAN) IV, sodium chloride flush, sodium chloride flush, sodium chloride flush, temazepam    Patient Profile   [name] is a 41 year old with complicated medical history including severe biventricular  CHF/NICM on milrinone 0.5 mcg, severe aortic valve insufficiency s/p repair in 2018 followed by SAVR with 27 mm Inspiris valve at [facility] on 02/22/22, CKD IIIb, and PAF currently admitted for cardiogenic shock with plan for transfer to [facility] early next week.    Assessment/Plan   1. A/C Biventricular HF - Admitted w/ signs of volume overload and cardiogenic shock evidenced by elevated lactate which has improved with IV diuresis.  -RHC yesterday as noted above with severely elevated pre and post-capillary filling pressures. This AM CVP has improved dramatically after 6L of urine output. eFick of 2.9L/min/m2 on milrinone 0.71mg/kg/min. Decrease lasix to '40mg'$  IV, start spironolactone '25mg'$  daily.    2. PAF - Most recent cardioversion 03/18/22. - Maintaining SR  - Continue amio 200 mg daily  - Heparin started for AC  3. CKD Stage IIIB - Creatinine baseline 2-5-2.8  - Improving   4. S/P Tisssue AVR  H/O AV repair 2018 with complex valvuloplasty including plication of left coronary leaflet and aortic ring annuloplasty -AVR [facility] 02/2022  -Echo 9/23 valve ok with mean gradient 6 mmHg. No significant perivalvular leak   Length of Stay: 2  Mekhi Lascola, DO  04/04/2022, 11:33 AM  Stefany Starace 11:38 AM

## 2022-04-04 NOTE — Progress Notes (Addendum)
ANTICOAGULATION CONSULT NOTE - Initial Consult  Pharmacy Consult for Heparin (Eliquis held) Indication: atrial fibrillation  No Known Allergies  Patient Measurements: Height: '5\' 10"'$  (177.8 cm) Weight: 74.8 kg (164 lb 14.5 oz) IBW/kg (Calculated) : 73 Heparin Dosing Weight: ~ 83 kg  Vital Signs: Temp: 97.9 F (36.6 C) (09/23 0307) Temp Source: Oral (09/23 0307) BP: 119/93 (09/23 0307) Pulse Rate: 92 (09/23 0307)  Labs: Recent Labs    04/02/22 1543 04/02/22 1730 04/03/22 0325 04/03/22 0931 04/04/22 0550  HGB 11.7*  --   --  12.9*  12.2* 10.6*  HCT 35.7*  --   --  38.0*  36.0* 32.8*  PLT 262  --   --   --  217  APTT  --   --   --   --  68*  HEPARINUNFRC  --   --   --   --  >1.10*  CREATININE 2.64*  --  2.51*  --  2.38*  TROPONINIHS 132* 123*  --   --   --      Estimated Creatinine Clearance: 42.6 mL/min (A) (by C-G formula based on SCr of 2.38 mg/dL (H)).   Medical History: Past Medical History:  Diagnosis Date   Anxiety    CHF (congestive heart failure) (HCC)    Chronic systolic heart failure (HCC)    CKD (chronic kidney disease) stage 2, GFR 60-89 ml/min    Dyspnea    with value issues- "if I dont take my medication"   Essential hypertension, benign    Headache    History of pneumonia    Mitral regurgitation    Moderate   Noncompliance    Nonischemic cardiomyopathy ([place])    Normal coronaries May 2012, LVEF < 20%   Pneumonia 2011   S/P aortic valve repair 05/11/2017   Complex valvuloplasty including plication of left coronary leaflet and 37m Biostable HAART ring annuloplasty    Medications:  Infusions:   sodium chloride     sodium chloride     heparin 1,600 Units/hr (04/04/22 0556)   milrinone 0.5 mcg/kg/min (04/04/22 0305)    Assessment: 41yo male with afib on chronic Eliquis.  Holding Eliquis today in [place] [facility] [place].  During his last admission pt required higher doses of heparin  (2000 units/hr) to reach therapeutic levels. Heparin level this AM is supratherapeutic at >1.1 as expected, aPTT is therapeutic at 68 on 1600 units/hr. Platelet count is 217K and Hg is 10.6. No bleeding noted.  Last dose of Eliquis taken 9/22 at 1030.  Goal of Therapy:  aPTT 66-102 Heparin level 0.3-0.7 units/ml Monitor platelets by anticoagulation protocol: Yes   Plan:  Continue heparin at 1600 units/hr 6 hour aPTT Check aPTT and heparin level 6 hrs after gtt starts. Daily heparin level, aPTT, and CBC.  Thank you for allowing pharmacy to participate in this patient's care.  NReatha Harps PharmD PGY2 Pharmacy Resident 04/04/2022 7:45 AM Check [email or web address] for unit specific pharmacy number  ADDENDUM:  Confirmatory aPTT drawn from same line as infusion and is supratherapeutic as expected, will retime next draw for AM labs.  Thank you for allowing pharmacy to participate in this patient's care.  NReatha Harps PharmD PGY2 Pharmacy Resident 04/04/2022 3:26 PM Check [email or web address] for unit specific pharmacy number

## 2022-04-04 NOTE — Plan of Care (Signed)
  Problem: Education: Goal: Understanding of CV disease, CV risk reduction, and recovery process will improve Outcome: Progressing   Problem: Cardiovascular: Goal: Vascular access site(s) Level 0-1 will be maintained Outcome: Progressing   Problem: Health Behavior/Discharge Planning: Goal: Ability to manage health-related needs will improve Outcome: Progressing   Problem: Clinical Measurements: Goal: Respiratory complications will improve Outcome: Progressing Goal: Cardiovascular complication will be avoided Outcome: Progressing   Problem: Activity: Goal: Risk for activity intolerance will decrease Outcome: Progressing   Problem: Elimination: Goal: Will not experience complications related to urinary retention Outcome: Progressing

## 2022-04-05 LAB — BLOOD GAS, VENOUS
Acid-Base Excess: 1.5 mmol/L (ref 0.0–2.0)
Bicarbonate: 25.9 mmol/L (ref 20.0–28.0)
Drawn by: 6414
O2 Saturation: 58.5 %
Patient temperature: 37
pCO2, Ven: 39 mmHg — ABNORMAL LOW (ref 44–60)
pH, Ven: 7.43 (ref 7.25–7.43)
pO2, Ven: 35 mmHg (ref 32–45)

## 2022-04-05 LAB — HEPATIC FUNCTION PANEL
ALT: 16 U/L (ref 0–44)
AST: 21 U/L (ref 15–41)
Albumin: 3.2 g/dL — ABNORMAL LOW (ref 3.5–5.0)
Alkaline Phosphatase: 84 U/L (ref 38–126)
Bilirubin, Direct: 0.6 mg/dL — ABNORMAL HIGH (ref 0.0–0.2)
Indirect Bilirubin: 0.9 mg/dL (ref 0.3–0.9)
Total Bilirubin: 1.5 mg/dL — ABNORMAL HIGH (ref 0.3–1.2)
Total Protein: 6.8 g/dL (ref 6.5–8.1)

## 2022-04-05 LAB — BASIC METABOLIC PANEL
Anion gap: 12 (ref 5–15)
BUN: 43 mg/dL — ABNORMAL HIGH (ref 6–20)
CO2: 26 mmol/L (ref 22–32)
Calcium: 9.3 mg/dL (ref 8.9–10.3)
Chloride: 92 mmol/L — ABNORMAL LOW (ref 98–111)
Creatinine, Ser: 2.32 mg/dL — ABNORMAL HIGH (ref 0.61–1.24)
GFR, Estimated: 36 mL/min — ABNORMAL LOW (ref >60)
Glucose, Bld: 113 mg/dL — ABNORMAL HIGH (ref 70–99)
Potassium: 3.5 mmol/L (ref 3.5–5.1)
Sodium: 130 mmol/L — ABNORMAL LOW (ref 135–145)

## 2022-04-05 LAB — HEPARIN LEVEL (UNFRACTIONATED): Heparin Unfractionated: >1.1 IU/mL — ABNORMAL HIGH (ref 0.30–0.70)

## 2022-04-05 LAB — COOXEMETRY PANEL
Carboxyhemoglobin: 3.2 % — ABNORMAL HIGH (ref 0.5–1.5)
Methemoglobin: <0.7 % (ref 0.0–1.5)
O2 Saturation: 63.6 %
Total hemoglobin: 11 g/dL — ABNORMAL LOW (ref 12.0–16.0)

## 2022-04-05 LAB — CBC
HCT: 33.4 % — ABNORMAL LOW (ref 39.0–52.0)
Hemoglobin: 10.8 g/dL — ABNORMAL LOW (ref 13.0–17.0)
MCH: 27.8 pg (ref 26.0–34.0)
MCHC: 32.3 g/dL (ref 30.0–36.0)
MCV: 85.9 fL (ref 80.0–100.0)
Platelets: 208 10*3/uL (ref 150–400)
RBC: 3.89 MIL/uL — ABNORMAL LOW (ref 4.22–5.81)
RDW: 21.2 % — ABNORMAL HIGH (ref 11.5–15.5)
WBC: 7.8 10*3/uL (ref 4.0–10.5)
nRBC: 0 % (ref 0.0–0.2)

## 2022-04-05 LAB — GLUCOSE, CAPILLARY: Glucose-Capillary: 143 mg/dL — ABNORMAL HIGH (ref 70–99)

## 2022-04-05 LAB — LACTIC ACID, PLASMA: Lactic Acid, Venous: 1.8 mmol/L (ref 0.5–1.9)

## 2022-04-05 LAB — APTT: aPTT: 74 seconds — ABNORMAL HIGH (ref 24–36)

## 2022-04-05 LAB — MAGNESIUM: Magnesium: 2.2 mg/dL (ref 1.7–2.4)

## 2022-04-05 MED ORDER — HYDRALAZINE HCL 10 MG PO TABS
10.0000 mg | ORAL_TABLET | Freq: Three times a day (TID) | ORAL | Status: DC
  Administered 2022-04-05 – 2022-04-06 (×2): 10 mg via ORAL
  Filled 2022-04-05 (×2): qty 1

## 2022-04-05 MED ORDER — FUROSEMIDE 10 MG/ML IJ SOLN
60.0000 mg | Freq: Once | INTRAMUSCULAR | Status: AC
  Administered 2022-04-05: 60 mg via INTRAVENOUS
  Filled 2022-04-05: qty 6

## 2022-04-05 MED ORDER — FUROSEMIDE 10 MG/ML IJ SOLN
40.0000 mg | Freq: Every day | INTRAMUSCULAR | Status: DC
  Administered 2022-04-05: 20 mg via INTRAVENOUS

## 2022-04-05 MED ORDER — METOCLOPRAMIDE HCL 5 MG/ML IJ SOLN
10.0000 mg | Freq: Four times a day (QID) | INTRAMUSCULAR | Status: DC
  Administered 2022-04-06 (×2): 10 mg via INTRAVENOUS
  Filled 2022-04-05 (×2): qty 2

## 2022-04-05 MED ORDER — HYDRALAZINE HCL 10 MG PO TABS: 5.0000 mg | ORAL_TABLET | Freq: Three times a day (TID) | ORAL | Status: DC

## 2022-04-05 MED ORDER — PROMETHAZINE HCL 25 MG PO TABS
12.5000 mg | ORAL_TABLET | Freq: Four times a day (QID) | ORAL | Status: DC | PRN
  Administered 2022-04-05: 12.5 mg via ORAL
  Filled 2022-04-05: qty 1

## 2022-04-05 MED ORDER — POTASSIUM CHLORIDE CRYS ER 20 MEQ PO TBCR
40.0000 meq | EXTENDED_RELEASE_TABLET | Freq: Once | ORAL | Status: AC
  Administered 2022-04-05: 40 meq via ORAL
  Filled 2022-04-05: qty 2

## 2022-04-05 MED ORDER — PROMETHAZINE HCL 25 MG PO TABS: 25.0000 mg | ORAL_TABLET | Freq: Four times a day (QID) | ORAL | Status: DC | PRN

## 2022-04-05 MED ORDER — HYDRALAZINE HCL 10 MG PO TABS
5.0000 mg | ORAL_TABLET | Freq: Three times a day (TID) | ORAL | Status: DC
  Administered 2022-04-05: 5 mg via ORAL
  Filled 2022-04-05: qty 1

## 2022-04-05 NOTE — Progress Notes (Signed)
Advanced Heart Failure Rounding Note  PCP-Cardiologist: Glori Bickers, MD   Subjective:    RHC 04/03/22 w/ RA 13, PCW 32 with large V waves & PA sat of 55%.  Improvement in dyspnea/fatigue today, CVP down to 7.   Objective:   Weight Range: 74.7 kg Body mass index is 23.62 kg/m.   Vital Signs:   Temp:  [97.4 F (36.3 C)-98.1 F (36.7 C)] 97.4 F (36.3 C) (09/24 0311) Pulse Rate:  [93-98] 94 (09/24 0311) Resp:  [19-24] 24 (09/24 0311) BP: (114-127)/(93-102) 127/98 (09/24 0311) SpO2:  [95 %-97 %] 97 % (09/24 0311) Weight:  [74.7 kg] 74.7 kg (09/24 0556) Last BM Date : 04/02/22  Weight change: Filed Weights   04/03/22 0323 04/04/22 0552 04/05/22 0556  Weight: 79.6 kg 74.8 kg 74.7 kg    Intake/Output:   Intake/Output Summary (Last 24 hours) at 04/05/2022 1013 Last data filed at 04/04/2022 2317 Gross per 24 hour  Intake 1121.01 ml  Output 2200 ml  Net -1078.99 ml       Physical Exam  CVP 7-8 General:  Lying comfortably in bed. HEENT: Normal Neck: Supple. JVP 8-9cm  Cor: PMI nondisplaced. Regular rate & rhythm.  Lungs: some inspiratory crackles at the bases Abdomen: Soft, nontender, nondistended.  Extremities: No cyanosis, clubbing, rash, edema, + LUE PICC Neuro: Alert & orientedx3, cranial nerves grossly intact. moves all 4 extremities w/o difficulty. Affect pleasant   Telemetry   SR 90s   Labs    CBC Recent Labs    04/04/22 0550 04/05/22 0435  WBC 6.5 7.8  HGB 10.6* 10.8*  HCT 32.8* 33.4*  MCV 85.2 85.9  PLT 217 324    Basic Metabolic Panel Recent Labs    04/04/22 0550 04/05/22 0435  NA 130* 130*  K 3.3* 3.5  CL 91* 92*  CO2 26 26  GLUCOSE 110* 113*  BUN 40* 43*  CREATININE 2.38* 2.32*  CALCIUM 9.4 9.3  MG 1.9 2.2    Liver Function Tests Recent Labs    04/02/22 1730  AST 23  ALT 17  ALKPHOS 90  BILITOT 1.8*  PROT 7.3  ALBUMIN 3.5    Recent Labs    04/02/22 1730  LIPASE 45   : BNP (last 3 results) Recent  Labs    03/13/22 1007 03/13/22 1410 04/02/22 1543  BNP 2,469.0* 3,264.6* 2,570.9*    Imaging   - Echo ~20% in 2013. EF 45-50% in 2016. Suspect due to ongoing AI +/- HTN - s/p AoV repair 10/18.  - Echo 12/18 with EF 45-50% moderate AI - Echo (2019): EF 30-35% Mod AI.  - Echo (8/20): EF 45% (out of range for ICD) - Echo (3/21): EF 45% moderate AI. LV dimensions stable (LVIDd 6.5cm) - Echo (10/22): EF 30-35% LV dilated. Mild-mod AI Mod-severe central MR (LVIDd 6.0cm)  - Echo (6/23): EF 20-25%, severe biventricular dysfunction, mild to moderate MR, severe AI (V max  2.9 m/s, MG 16 mmHG.)  Medications:     Scheduled Medications:  allopurinol  300 mg Oral Daily   amiodarone  200 mg Oral Daily   aspirin EC  81 mg Oral Daily   Chlorhexidine Gluconate Cloth  6 each Topical Daily   furosemide  40 mg Intravenous Daily   hydrALAZINE  5 mg Oral Q8H   potassium chloride  40 mEq Oral Daily   sertraline  50 mg Oral Daily   sodium chloride flush  10-40 mL Intracatheter Q12H   sodium chloride flush  3 mL Intravenous Q12H   sodium chloride flush  3 mL Intravenous Q12H   sodium chloride flush  3 mL Intravenous Q12H   spironolactone  25 mg Oral Daily    Infusions:  sodium chloride     sodium chloride     heparin 1,600 Units/hr (04/05/22 0615)   milrinone 0.5 mcg/kg/min (04/05/22 0419)    PRN Medications: sodium chloride, sodium chloride, acetaminophen, ondansetron (ZOFRAN) IV, sodium chloride flush, sodium chloride flush, sodium chloride flush, temazepam    Patient Profile   [name] [name] is a 41 year old with complicated medical history including severe biventricular CHF/NICM on milrinone 0.5 mcg, severe aortic valve insufficiency s/p repair in 2018 followed by SAVR with 27 mm Inspiris valve at [facility] on 02/22/22, CKD IIIb, and PAF currently admitted for cardiogenic shock with plan for transfer to [facility] early next week.    Assessment/Plan   1. A/C Biventricular HF - Admitted w/  signs of volume overload and cardiogenic shock evidenced by elevated lactate which has improved with IV diuresis.  -Significant improvement on bedside exam; CVP down 7, will hold off on further diuretics in the setting of RV failure. Starting hydralazine '5mg'$  TID for afterload reduction. Renal function continuing to improve. eFick today (mixed venous 63%, O2 sat 95): 2.5L/min/m2   2. PAF - Most recent cardioversion 03/18/22. - Maintaining SR  - Continue amio 200 mg daily  - Continue heparin gtt.   3. CKD Stage IIIB - Improving, sCr 2.32 today.   4. S/P Tisssue AVR  H/O AV repair 2018 with complex valvuloplasty including plication of left coronary leaflet and aortic ring annuloplasty -AVR [facility] 02/2022  -Echo 9/23 valve ok with mean gradient 6 mmHg. No significant perivalvular leak   Length of Stay: 3  [name], DO  04/05/2022, 10:13 AM  [name] 10:13 AM

## 2022-04-05 NOTE — Plan of Care (Signed)
  Problem: Activity: Goal: Ability to tolerate increased activity will improve Outcome: Progressing   Problem: Clinical Measurements: Goal: Respiratory complications will improve Outcome: Progressing Goal: Cardiovascular complication will be avoided Outcome: Progressing   Problem: Elimination: Goal: Will not experience complications related to urinary retention Outcome: Progressing   Problem: Activity: Goal: Risk for activity intolerance will decrease Outcome: Not Progressing   Problem: Nutrition: Goal: Adequate nutrition will be maintained Outcome: Not Progressing

## 2022-04-05 NOTE — Progress Notes (Signed)
ANTICOAGULATION CONSULT NOTE - Follow up [place] for Heparin (Eliquis held) Indication: atrial fibrillation  No Known Allergies  Patient Measurements: Height: '5\' 10"'$  (177.8 cm) Weight: 74.7 kg (164 lb 9.6 oz) IBW/kg (Calculated) : 73 Heparin Dosing Weight: ~ 83 kg  Vital Signs: Temp: 97.4 F (36.3 C) (09/24 0311) Temp Source: Oral (09/24 0311) BP: 127/98 (09/24 0311) Pulse Rate: 94 (09/24 0311)  Labs: Recent Labs    04/02/22 1543 04/02/22 1730 04/03/22 0325 04/03/22 0931 04/04/22 0550 04/04/22 1355 04/05/22 0435 04/05/22 0736  HGB 11.7*  --   --  12.9*  12.2* 10.6*  --  10.8*  --   HCT 35.7*  --   --  38.0*  36.0* 32.8*  --  33.4*  --   PLT 262  --   --   --  217  --  208  --   APTT  --   --   --   --  68* >200*  --  74*  HEPARINUNFRC  --   --   --   --  >1.10*  --   --  >1.10*  CREATININE 2.64*  --  2.51*  --  2.38*  --  2.32*  --   TROPONINIHS 132* 123*  --   --   --   --   --   --      Estimated Creatinine Clearance: 43.7 mL/min (A) (by C-G formula based on SCr of 2.32 mg/dL (H)).   Medical History: Past Medical History:  Diagnosis Date   Anxiety    CHF (congestive heart failure) (HCC)    Chronic systolic heart failure (HCC)    CKD (chronic kidney disease) stage 2, GFR 60-89 ml/min    Dyspnea    with value issues- "if I dont take my medication"   Essential hypertension, benign    Headache    History of pneumonia    Mitral regurgitation    Moderate   Noncompliance    Nonischemic cardiomyopathy ([place])    Normal coronaries May 2012, LVEF < 20%   Pneumonia 2011   S/P aortic valve repair 05/11/2017   Complex valvuloplasty including plication of left coronary leaflet and 96m Biostable HAART ring annuloplasty    Medications:  Infusions:   sodium chloride     sodium chloride     heparin 1,600 Units/hr (04/05/22 0615)   milrinone 0.5 mcg/kg/min (04/05/22 0419)    Assessment: 41yo male with afib on chronic Eliquis.  [place]olding  [place].  During his last admission pt required higher doses of heparin (2000 units/hr) to reach therapeutic levels. Heparin level this AM is supratherapeutic at >1.1 as expected, aPTT is therapeutic at 74 on 1600 units/hr. Levels do not correlate yet. Platelet count is 208K and Hg is 10.8. No bleeding noted.  Last dose of Eliquis taken 9/22 at 1030.  Goal of Therapy:  aPTT 66-102 Heparin level 0.3-0.7 units/ml Monitor platelets by anticoagulation protocol: Yes   Plan:  Continue heparin at 1600 units/hr Check aPTT and heparin level daily Daily heparin level, aPTT, and CBC.  Thank you for allowing pharmacy to participate in this patient's care.  NReatha Harps PharmD PGY2 Pharmacy Resident 04/05/2022 8:42 AM Check [email or web address] for unit specific pharmacy number

## 2022-04-05 NOTE — Progress Notes (Addendum)
Pt is still feeling nauseas and vomiting after taking Phenergan. BP is 130/105, MAP 114. Paged MD, awaiting response.

## 2022-04-05 NOTE — Progress Notes (Signed)
Pt is stating that he has been nauseous all day and vomited earlier today. Pt was given Zofran at 1739 by Day RN but states it did not help and that he still feels nauseous. Paged MD, awaiting response.

## 2022-04-06 DIAGNOSIS — I5023 Acute on chronic systolic (congestive) heart failure: Secondary | ICD-10-CM | POA: Diagnosis not present

## 2022-04-06 DIAGNOSIS — I509 Heart failure, unspecified: Secondary | ICD-10-CM | POA: Diagnosis not present

## 2022-04-06 LAB — COOXEMETRY PANEL
Carboxyhemoglobin: 2.2 % — ABNORMAL HIGH (ref 0.5–1.5)
Methemoglobin: <0.7 % (ref 0.0–1.5)
O2 Saturation: 60.8 %
Total hemoglobin: 11.2 g/dL — ABNORMAL LOW (ref 12.0–16.0)

## 2022-04-06 LAB — BASIC METABOLIC PANEL
Anion gap: 16 — ABNORMAL HIGH (ref 5–15)
BUN: 38 mg/dL — ABNORMAL HIGH (ref 6–20)
CO2: 22 mmol/L (ref 22–32)
Calcium: 9.8 mg/dL (ref 8.9–10.3)
Chloride: 92 mmol/L — ABNORMAL LOW (ref 98–111)
Creatinine, Ser: 2.41 mg/dL — ABNORMAL HIGH (ref 0.61–1.24)
GFR, Estimated: 34 mL/min — ABNORMAL LOW (ref >60)
Glucose, Bld: 114 mg/dL — ABNORMAL HIGH (ref 70–99)
Potassium: 5 mmol/L (ref 3.5–5.1)
Sodium: 130 mmol/L — ABNORMAL LOW (ref 135–145)

## 2022-04-06 LAB — CBC
HCT: 33.7 % — ABNORMAL LOW (ref 39.0–52.0)
Hemoglobin: 10.8 g/dL — ABNORMAL LOW (ref 13.0–17.0)
MCH: 27.3 pg (ref 26.0–34.0)
MCHC: 32 g/dL (ref 30.0–36.0)
MCV: 85.3 fL (ref 80.0–100.0)
Platelets: 225 10*3/uL (ref 150–400)
RBC: 3.95 MIL/uL — ABNORMAL LOW (ref 4.22–5.81)
RDW: 21.2 % — ABNORMAL HIGH (ref 11.5–15.5)
WBC: 8.1 10*3/uL (ref 4.0–10.5)
nRBC: 0 % (ref 0.0–0.2)

## 2022-04-06 LAB — APTT: aPTT: 85 seconds — ABNORMAL HIGH (ref 24–36)

## 2022-04-06 LAB — HEPARIN LEVEL (UNFRACTIONATED): Heparin Unfractionated: >1.1 IU/mL — ABNORMAL HIGH (ref 0.30–0.70)

## 2022-04-06 LAB — MAGNESIUM: Magnesium: 2.1 mg/dL (ref 1.7–2.4)

## 2022-04-06 MED ORDER — HEPARIN (PORCINE) 25000 UT/250ML-% IV SOLN: 1600.0000 [IU]/h | INTRAVENOUS | Status: DC

## 2022-04-06 MED ORDER — FUROSEMIDE 10 MG/ML IJ SOLN: 60.0000 mg | Freq: Two times a day (BID) | INTRAMUSCULAR | 0 refills | Status: DC

## 2022-04-06 MED ORDER — ISOSORBIDE MONONITRATE ER 30 MG PO TB24
30.0000 mg | ORAL_TABLET | Freq: Every day | ORAL | Status: DC
  Administered 2022-04-06: 30 mg via ORAL
  Filled 2022-04-06: qty 1

## 2022-04-06 MED ORDER — MILRINONE LACTATE IN DEXTROSE 20-5 MG/100ML-% IV SOLN: 0.5000 ug/kg/min | INTRAVENOUS | Status: DC

## 2022-04-06 MED ORDER — METOCLOPRAMIDE HCL 5 MG/ML IJ SOLN: 10.0000 mg | Freq: Four times a day (QID) | INTRAMUSCULAR | 0 refills | Status: DC

## 2022-04-06 MED ORDER — ISOSORBIDE MONONITRATE ER 30 MG PO TB24: 30.0000 mg | ORAL_TABLET | Freq: Every day | ORAL | Status: DC

## 2022-04-06 MED ORDER — ONDANSETRON HCL 4 MG/2ML IJ SOLN: 4.0000 mg | Freq: Four times a day (QID) | INTRAMUSCULAR | 0 refills | Status: DC | PRN

## 2022-04-06 MED ORDER — HYDRALAZINE HCL 10 MG PO TABS: 10.0000 mg | ORAL_TABLET | Freq: Three times a day (TID) | ORAL | Status: DC

## 2022-04-06 MED ORDER — SPIRONOLACTONE 25 MG PO TABS: 25.0000 mg | ORAL_TABLET | Freq: Every day | ORAL | Status: DC

## 2022-04-06 MED ORDER — ORAL CARE MOUTH RINSE: 15.0000 mL | OROMUCOSAL | Status: DC | PRN

## 2022-04-06 MED ORDER — PROMETHAZINE HCL 25 MG PO TABS: 25.0000 mg | ORAL_TABLET | Freq: Four times a day (QID) | ORAL | 0 refills | Status: DC | PRN

## 2022-04-06 MED ORDER — FUROSEMIDE 10 MG/ML IJ SOLN: 60.0000 mg | Freq: Two times a day (BID) | INTRAMUSCULAR | Status: DC

## 2022-04-06 NOTE — Progress Notes (Addendum)
Advanced Heart Failure Rounding Note  PCP-Cardiologist: Glori Bickers, MD   Subjective:    09/21: Admit with a/c CHF with low-output. Lactic acid 3.2>>2.9. BNP > 2,500. 09/22: RHC  RA = 13 RV = 63/17 PA = 65/32 (43) PCW = 32 (v = 40-45) Fick cardiac output/index = 5.0/2.5 (on milrinone 0.5 mcg/kg/min) PVR = 2.5 WU Ao sat = 88% PA sat = 54%, 55% PAPi = 2.5  CO-OX 61% on milrinone 0.5.   Yesterday received 20 mg lasix IV am and 60 mg lasix IV pm. CVP 10-11. Weight up 1 lb.   BP 120s-130s/90s-100s  Scr stable at 2.4. K 5.0. On spiro + potassium supplement.  Had some nausea and emesis overnight, feeling better this am. Able to eat all of his breakfast.   No dyspnea, orthopnea or PND.    Objective:   Weight Range: 75 kg Body mass index is 23.72 kg/m.   Vital Signs:   Temp:  [97.3 F (36.3 C)-98 F (36.7 C)] 98 F (36.7 C) (09/25 0355) Pulse Rate:  [93-96] 93 (09/25 0641) Resp:  [15-31] 15 (09/25 0641) BP: (113-136)/(93-108) 136/106 (09/25 0355) SpO2:  [91 %-99 %] 91 % (09/25 0641) Weight:  [75 kg] 75 kg (09/25 0641) Last BM Date : 04/02/22  Weight change: Filed Weights   04/04/22 0552 04/05/22 0556 04/06/22 0641  Weight: 74.8 kg 74.7 kg 75 kg    Intake/Output:   Intake/Output Summary (Last 24 hours) at 04/06/2022 0833 Last data filed at 04/06/2022 6237 Gross per 24 hour  Intake 797.79 ml  Output 1850 ml  Net -1052.21 ml      Physical Exam    General:  Sitting up in bed. No distress.  HEENT: normal Neck: supple. JVP 10-12. Carotids 2+ bilat; no bruits.  Cor: PMI nondisplaced. Regular rate & rhythm. No rubs, gallops or murmurs. + RV lift Lungs: clear Abdomen: soft, nontender, nondistended.  Extremities: no cyanosis, clubbing, rash, edema, + LUE PICC Neuro: alert & orientedx3, cranial nerves grossly intact. moves all 4 extremities w/o difficulty. Affect pleasant    Telemetry  SR 90s (personally reviewed)   Labs    CBC Recent Labs     04/05/22 0435 04/06/22 0434  WBC 7.8 8.1  HGB 10.8* 10.8*  HCT 33.4* 33.7*  MCV 85.9 85.3  PLT 208 628   Basic Metabolic Panel Recent Labs    04/05/22 0435 04/06/22 0434  NA 130* 130*  K 3.5 5.0  CL 92* 92*  CO2 26 22  GLUCOSE 113* 114*  BUN 43* 38*  CREATININE 2.32* 2.41*  CALCIUM 9.3 9.8  MG 2.2 2.1   Liver Function Tests Recent Labs    04/05/22 1412  AST 21  ALT 16  ALKPHOS 84  BILITOT 1.5*  PROT 6.8  ALBUMIN 3.2*   No results for input(s): "LIPASE", "AMYLASE" in the last 72 hours.  Cardiac Enzymes No results for input(s): "CKTOTAL", "CKMB", "CKMBINDEX", "TROPONINI" in the last 72 hours.  BNP: BNP (last 3 results) Recent Labs    03/13/22 1007 03/13/22 1410 04/02/22 1543  BNP 2,469.0* 3,264.6* 2,570.9*    ProBNP (last 3 results) No results for input(s): "PROBNP" in the last 8760 hours.   D-Dimer No results for input(s): "DDIMER" in the last 72 hours. Hemoglobin A1C No results for input(s): "HGBA1C" in the last 72 hours. Fasting Lipid Panel No results for input(s): "CHOL", "HDL", "LDLCALC", "TRIG", "CHOLHDL", "LDLDIRECT" in the last 72 hours. Thyroid Function Tests No results for input(s): "TSH", "  T4TOTAL", "T3FREE", "THYROIDAB" in the last 72 hours.  Invalid input(s): "FREET3"  Other results:   Imaging    No results found.   Medications:     Scheduled Medications:  allopurinol  300 mg Oral Daily   amiodarone  200 mg Oral Daily   aspirin EC  81 mg Oral Daily   Chlorhexidine Gluconate Cloth  6 each Topical Daily   hydrALAZINE  10 mg Oral Q8H   metoCLOPramide (REGLAN) injection  10 mg Intravenous Q6H   potassium chloride  40 mEq Oral Daily   sertraline  50 mg Oral Daily   sodium chloride flush  10-40 mL Intracatheter Q12H   sodium chloride flush  3 mL Intravenous Q12H   sodium chloride flush  3 mL Intravenous Q12H   sodium chloride flush  3 mL Intravenous Q12H   spironolactone  25 mg Oral Daily    Infusions:  sodium  chloride     sodium chloride     heparin 1,600 Units/hr (04/05/22 2232)   milrinone 0.5 mcg/kg/min (04/06/22 0556)    PRN Medications: sodium chloride, sodium chloride, acetaminophen, ondansetron (ZOFRAN) IV, mouth rinse, promethazine, sodium chloride flush, sodium chloride flush, sodium chloride flush, temazepam    Patient Profile   [name] [name] is a 41 year old with complicated medical history including severe biventricular CHF/NICM on milrinone 0.5 mcg, severe aortic valve insufficiency s/p repair in 2018 followed by SAVR with 27 mm Inspiris valve at [facility] on 02/22/22, CKD IIIb, and PAF. Has upcoming transplant eval for heart/kidney at [facility].   Admitted with a/c biventricular HF and lactic acidosis >> concern for cardiogenic shock.   Assessment/Plan   1. A/C Biventricular HF with cardiogenic shock - Echo ~20% in 2013. EF 45-50% in 2016. Suspect due to ongoing AI +/- HTN - s/p AoV repair 10/18.  - Echo 12/18 with EF 45-50% moderate AI - Echo (2019): EF 30-35% Mod AI.  - Echo (8/20): EF 45% (out of range for ICD) - Echo (3/21): EF 45% moderate AI. LV dimensions stable (LVIDd 6.5cm) - Echo (10/22): EF 30-35% LV dilated. Mild-mod AI Mod-severe central MR (LVIDd 6.0cm)  - Echo (6/23): EF 20-25%, severe biventricular dysfunction, mild to moderate MR, severe AI (V max  2.9 m/s, MG 16 mmHG.) - Admitted [facility] 8/23 w/ marked volume overload in the setting of cardiogenic shock. IABP placed, milrinone increased 0.5 mcg, and norepi started. Transferred to [facility] for transplant eval - At [facility], decision made to do tissue AVR 8/23. Did not get transplant. D/c home w/ milrinone + LifeVest  - Admitted 9/23 with a/c BiV failure w/ cardiogenic shock, in setting of afib w/ RVR>>cardioverted and diuresed w/ lasix gtt  - Echo 9/23 EF < 20%, RV severely reduced, tissue AoV ok  -He has been on milrinone 0.5 mcg. He is being worked for possible heart/kidney transplant at [facility]. Admit with volume  overload. BNP > 2500. Lactic Acid 3.2>2.9. - RHC 09/22 on 0.5 milrinone: RA 13, PCWP 32 ( v 40-45), Fick CI 2.5, PA sat 55%, PAPi 2.5 - CO-OX 61% this am on 0.5 milrinone. CVP 10-11. Give 60 mg lasix IV BID. - No bb with low output - On hydralazine 10 TID. Add imdur 30 daily - Continue spiro 25 daily. Watch K (potassium 5.0). Potassium supplement discontinued.  - GDMT limited by CKD Stage IIIb.  2. PAF - Most recent cardioversion 03/18/22. - Maintaining SR  - Continue amio 200 mg daily  - On heparin gtt in  anticipation of transfer to [facility] for transplant eval  3. CKD Stage IIIB - Creatinine baseline 2-5-2.8  - Cr stable 2.4 today   4. S/P Tisssue AVR  - H/O AV repair 2018 with complex valvuloplasty including plication of left coronary leaflet and aortic ring annuloplasty -AVR [facility] 02/2022  -Echo 9/23 valve ok with mean gradient 6 mmHg. No significant perivalvular leak     Home infusion rep for home inotrope, Carolynn Sayers, RN, aware of admission.   Updated wife of patient's progress via phone. Will review timing of transfer to [facility] with Dr. Haroldine Laws.   Length of Stay: 4  [facility], [place], PA-C  04/06/2022, 8:33 AM  Advanced Heart Failure Team Pager [phone] (M-F; 7a - 5p)  Please contact [facility] for night-coverage after hours (5p -7a ) and weekends on [email or web address]  Patient seen and examined with the above-signed Advanced Practice Provider and/or Housestaff. I personally reviewed laboratory data, imaging studies and relevant notes. I independently examined the patient and formulated the important aspects of the plan. I have edited the note to reflect any of my changes or salient points. I have personally discussed the plan with the patient and/or family.  Remains tenuous. On milrinone 0.5. Was more symptomatic yesterday and received IV lasix. Had some nausea this am but now improved. MV sat 61% CVP 10-12 range.  On IV heparin. No bleeding.   General:  Weak appearing. No  resp difficulty HEENT: normal Neck: supple. JVP 10. Carotids 2+ bilat; no bruits. No lymphadenopathy or thryomegaly appreciated. Cor: PMI nondisplaced. Regular rate & rhythm. +s3 + RV lift Lungs: clear Abdomen: soft, nontender, nondistended. No hepatosplenomegaly. No bruits or masses. Good bowel sounds. Extremities: no cyanosis, clubbing, rash, edema Neuro: alert & orientedx3, cranial nerves grossly intact. moves all 4 extremities w/o difficulty. Affect pleasant  Admitted with recurrent cardiogenic shock despite home milrinone and maintenance of sinus rhythm. Doubt we can keep him stable enough to return home without transplant.   Have d/w [facility]. Will plan transfer for evaluation of potential dual organ (heart/kidney) transplant.   Glori Bickers, MD  9:29 AM

## 2022-04-06 NOTE — Progress Notes (Signed)
Pt received Reglan '10mg'$  IV at 0009 and has reported that his nausea and vomiting went away.

## 2022-04-06 NOTE — Consult Note (Signed)
   [facility] Inpatient Consult   04/06/2022  [name] [name] 11-11-1980 [id number]  [name] [name] [ACO] Patient: [name] [name] [facility]  Primary Care Provider:  Sharilyn Sites, MD, [facility]  *Readmission less than 30 days and extreme high risk scores for unplanned readmission.  Patient is currently active with [facility] for chronic disease management services.  Patient has been engaged by a [place] for care coordination needs.  Our community based plan of care has focused on disease management and community resource support.    Plan: Patient will receive a post hospital call and will be evaluated for assessments and disease process education.   On unit rounds patient already transitioned home today per unit secretary. Will alert [facility] RN CM on Care Teams list regarding hospitalization.   Of note, [facility] Care Management services does not replace or interfere with any services that are needed or arranged by inpatient [facility] care management team.  For additional questions or referrals please contact:  Natividad Brood, RN BSN [place]  [phone] business mobile phone Toll free office [phone]  *[place]  [phone] Fax number: [phone] [email or web address]

## 2022-04-06 NOTE — Plan of Care (Signed)
  Problem: Cardiovascular: Goal: Vascular access site(s) Level 0-1 will be maintained Outcome: Progressing   Problem: Education: Goal: Knowledge of General Education information will improve Description: Including pain rating scale, medication(s)/side effects and non-pharmacologic comfort measures Outcome: Progressing   Problem: Health Behavior/Discharge Planning: Goal: Ability to manage health-related needs will improve Outcome: Progressing   Problem: Clinical Measurements: Goal: Ability to maintain clinical measurements within normal limits will improve Outcome: Progressing Goal: Will remain free from infection Outcome: Progressing   Problem: Activity: Goal: Risk for activity intolerance will decrease Outcome: Progressing   Problem: Nutrition: Goal: Adequate nutrition will be maintained Outcome: Progressing   Problem: Coping: Goal: Level of anxiety will decrease Outcome: Progressing   Problem: Pain Managment: Goal: General experience of comfort will improve Outcome: Progressing   Problem: Safety: Goal: Ability to remain free from injury will improve Outcome: Progressing   Problem: Activity: Goal: Capacity to carry out activities will improve Outcome: Progressing   Problem: Cardiac: Goal: Ability to achieve and maintain adequate cardiopulmonary perfusion will improve Outcome: Progressing

## 2022-04-07 ENCOUNTER — Telehealth: Payer: Self-pay | Admitting: *Deleted

## 2022-04-07 DIAGNOSIS — F4322 Adjustment disorder with anxiety: Secondary | ICD-10-CM | POA: Diagnosis not present

## 2022-04-07 DIAGNOSIS — I371 Nonrheumatic pulmonary valve insufficiency: Secondary | ICD-10-CM | POA: Diagnosis not present

## 2022-04-07 DIAGNOSIS — I5023 Acute on chronic systolic (congestive) heart failure: Secondary | ICD-10-CM | POA: Diagnosis not present

## 2022-04-07 DIAGNOSIS — Z01818 Encounter for other preprocedural examination: Secondary | ICD-10-CM | POA: Diagnosis not present

## 2022-04-07 DIAGNOSIS — I519 Heart disease, unspecified: Secondary | ICD-10-CM | POA: Diagnosis not present

## 2022-04-07 DIAGNOSIS — I351 Nonrheumatic aortic (valve) insufficiency: Secondary | ICD-10-CM | POA: Diagnosis not present

## 2022-04-07 DIAGNOSIS — Z7682 Awaiting organ transplant status: Secondary | ICD-10-CM | POA: Diagnosis not present

## 2022-04-07 DIAGNOSIS — I5041 Acute combined systolic (congestive) and diastolic (congestive) heart failure: Secondary | ICD-10-CM | POA: Diagnosis not present

## 2022-04-07 DIAGNOSIS — R570 Cardiogenic shock: Secondary | ICD-10-CM | POA: Diagnosis not present

## 2022-04-07 DIAGNOSIS — I428 Other cardiomyopathies: Secondary | ICD-10-CM | POA: Diagnosis not present

## 2022-04-07 DIAGNOSIS — I509 Heart failure, unspecified: Secondary | ICD-10-CM | POA: Diagnosis not present

## 2022-04-07 NOTE — Patient Outreach (Signed)
  Care Coordination   04/07/2022 Name: DIETER HANE MRN: [id number] DOB: 06-30-1981   Care Coordination Outreach Attempts:  An unsuccessful telephone outreach was attempted today to offer the patient information about available care coordination services as a benefit of their health plan.   Follow Up Plan:  Additional outreach attempts will be made to offer the patient care coordination information and services.   Encounter Outcome:  No Answer  Care Coordination Interventions Activated:  No   Care Coordination Interventions:  No, not indicated    Valente David, RN, MSN, [facility] Care Management Care Management Coordinator [phone]

## 2022-04-08 DIAGNOSIS — I509 Heart failure, unspecified: Secondary | ICD-10-CM | POA: Diagnosis not present

## 2022-04-08 DIAGNOSIS — R570 Cardiogenic shock: Secondary | ICD-10-CM | POA: Diagnosis not present

## 2022-04-08 DIAGNOSIS — Z01818 Encounter for other preprocedural examination: Secondary | ICD-10-CM | POA: Diagnosis not present

## 2022-04-08 DIAGNOSIS — J9811 Atelectasis: Secondary | ICD-10-CM | POA: Diagnosis not present

## 2022-04-09 ENCOUNTER — Telehealth: Payer: Self-pay | Admitting: *Deleted

## 2022-04-09 DIAGNOSIS — J90 Pleural effusion, not elsewhere classified: Secondary | ICD-10-CM | POA: Diagnosis not present

## 2022-04-09 DIAGNOSIS — I509 Heart failure, unspecified: Secondary | ICD-10-CM | POA: Diagnosis not present

## 2022-04-09 DIAGNOSIS — Z01818 Encounter for other preprocedural examination: Secondary | ICD-10-CM | POA: Diagnosis not present

## 2022-04-09 DIAGNOSIS — Z9889 Other specified postprocedural states: Secondary | ICD-10-CM | POA: Diagnosis not present

## 2022-04-09 DIAGNOSIS — I517 Cardiomegaly: Secondary | ICD-10-CM | POA: Diagnosis not present

## 2022-04-09 NOTE — Patient Outreach (Signed)
  Care Coordination   Follow Up Visit Note   04/09/2022 Name: JAYSEAN MANVILLE MRN: [id number] DOB: [birth year]  [name] is a 41 y.o. year old male who sees Sharilyn Sites, MD for primary care. I spoke with  [name] by phone today.  What matters to the patients health and wellness today?  Patient admitted to hospital ([facility]) for LVAD placement and transplant workup.     SDOH assessments and interventions completed:  No     Care Coordination Interventions Activated:  No  Care Coordination Interventions:  No, not indicated   Follow up plan:  Patient will call once he is discharged back home    Encounter Outcome:  Pt. Visit Completed

## 2022-04-10 DIAGNOSIS — Z452 Encounter for adjustment and management of vascular access device: Secondary | ICD-10-CM | POA: Diagnosis not present

## 2022-04-10 DIAGNOSIS — Z01818 Encounter for other preprocedural examination: Secondary | ICD-10-CM | POA: Diagnosis not present

## 2022-04-10 DIAGNOSIS — I509 Heart failure, unspecified: Secondary | ICD-10-CM | POA: Diagnosis not present

## 2022-04-10 DIAGNOSIS — J9811 Atelectasis: Secondary | ICD-10-CM | POA: Diagnosis not present

## 2022-04-10 DIAGNOSIS — R570 Cardiogenic shock: Secondary | ICD-10-CM | POA: Diagnosis not present

## 2022-04-10 DIAGNOSIS — Z4682 Encounter for fitting and adjustment of non-vascular catheter: Secondary | ICD-10-CM | POA: Diagnosis not present

## 2022-04-10 DIAGNOSIS — J811 Chronic pulmonary edema: Secondary | ICD-10-CM | POA: Diagnosis not present

## 2022-04-11 DIAGNOSIS — Z4682 Encounter for fitting and adjustment of non-vascular catheter: Secondary | ICD-10-CM | POA: Diagnosis not present

## 2022-04-11 DIAGNOSIS — I97618 Postprocedural hemorrhage and hematoma of a circulatory system organ or structure following other circulatory system procedure: Secondary | ICD-10-CM | POA: Diagnosis not present

## 2022-04-11 DIAGNOSIS — I509 Heart failure, unspecified: Secondary | ICD-10-CM | POA: Diagnosis not present

## 2022-04-11 DIAGNOSIS — I420 Dilated cardiomyopathy: Secondary | ICD-10-CM | POA: Diagnosis not present

## 2022-04-11 DIAGNOSIS — I5043 Acute on chronic combined systolic (congestive) and diastolic (congestive) heart failure: Secondary | ICD-10-CM | POA: Diagnosis not present

## 2022-04-11 DIAGNOSIS — Z01818 Encounter for other preprocedural examination: Secondary | ICD-10-CM | POA: Diagnosis not present

## 2022-04-12 DIAGNOSIS — I5043 Acute on chronic combined systolic (congestive) and diastolic (congestive) heart failure: Secondary | ICD-10-CM | POA: Diagnosis not present

## 2022-04-12 DIAGNOSIS — I509 Heart failure, unspecified: Secondary | ICD-10-CM | POA: Diagnosis not present

## 2022-04-12 DIAGNOSIS — Z01818 Encounter for other preprocedural examination: Secondary | ICD-10-CM | POA: Diagnosis not present

## 2022-04-12 DIAGNOSIS — J811 Chronic pulmonary edema: Secondary | ICD-10-CM | POA: Diagnosis not present

## 2022-04-13 DIAGNOSIS — R918 Other nonspecific abnormal finding of lung field: Secondary | ICD-10-CM | POA: Diagnosis not present

## 2022-04-13 DIAGNOSIS — I519 Heart disease, unspecified: Secondary | ICD-10-CM | POA: Diagnosis not present

## 2022-04-13 DIAGNOSIS — R570 Cardiogenic shock: Secondary | ICD-10-CM | POA: Diagnosis not present

## 2022-04-13 DIAGNOSIS — Z4682 Encounter for fitting and adjustment of non-vascular catheter: Secondary | ICD-10-CM | POA: Diagnosis not present

## 2022-04-13 DIAGNOSIS — Z01818 Encounter for other preprocedural examination: Secondary | ICD-10-CM | POA: Diagnosis not present

## 2022-04-13 DIAGNOSIS — Z452 Encounter for adjustment and management of vascular access device: Secondary | ICD-10-CM | POA: Diagnosis not present

## 2022-04-13 DIAGNOSIS — I351 Nonrheumatic aortic (valve) insufficiency: Secondary | ICD-10-CM | POA: Diagnosis not present

## 2022-04-13 DIAGNOSIS — I5043 Acute on chronic combined systolic (congestive) and diastolic (congestive) heart failure: Secondary | ICD-10-CM | POA: Diagnosis not present

## 2022-04-13 DIAGNOSIS — I428 Other cardiomyopathies: Secondary | ICD-10-CM | POA: Diagnosis not present

## 2022-04-14 DIAGNOSIS — Z4682 Encounter for fitting and adjustment of non-vascular catheter: Secondary | ICD-10-CM | POA: Diagnosis not present

## 2022-04-14 DIAGNOSIS — R570 Cardiogenic shock: Secondary | ICD-10-CM | POA: Diagnosis not present

## 2022-04-14 DIAGNOSIS — I428 Other cardiomyopathies: Secondary | ICD-10-CM | POA: Diagnosis not present

## 2022-04-14 DIAGNOSIS — I519 Heart disease, unspecified: Secondary | ICD-10-CM | POA: Diagnosis not present

## 2022-04-14 DIAGNOSIS — I5043 Acute on chronic combined systolic (congestive) and diastolic (congestive) heart failure: Secondary | ICD-10-CM | POA: Diagnosis not present

## 2022-04-14 DIAGNOSIS — J90 Pleural effusion, not elsewhere classified: Secondary | ICD-10-CM | POA: Diagnosis not present

## 2022-04-14 DIAGNOSIS — I351 Nonrheumatic aortic (valve) insufficiency: Secondary | ICD-10-CM | POA: Diagnosis not present

## 2022-04-14 DIAGNOSIS — I517 Cardiomegaly: Secondary | ICD-10-CM | POA: Diagnosis not present

## 2022-04-15 ENCOUNTER — Telehealth (HOSPITAL_COMMUNITY): Payer: Self-pay

## 2022-04-15 DIAGNOSIS — Z4682 Encounter for fitting and adjustment of non-vascular catheter: Secondary | ICD-10-CM | POA: Diagnosis not present

## 2022-04-15 DIAGNOSIS — I5043 Acute on chronic combined systolic (congestive) and diastolic (congestive) heart failure: Secondary | ICD-10-CM | POA: Diagnosis not present

## 2022-04-15 DIAGNOSIS — J90 Pleural effusion, not elsewhere classified: Secondary | ICD-10-CM | POA: Diagnosis not present

## 2022-04-15 NOTE — Telephone Encounter (Signed)
Called and was unable to leave a voice message to confirm/remind patient of their appointment at the [facility] on 04/16/22.

## 2022-04-16 ENCOUNTER — Encounter (HOSPITAL_COMMUNITY): Payer: BC Managed Care – PPO

## 2022-04-16 DIAGNOSIS — I519 Heart disease, unspecified: Secondary | ICD-10-CM | POA: Diagnosis not present

## 2022-04-16 DIAGNOSIS — R519 Headache, unspecified: Secondary | ICD-10-CM | POA: Diagnosis not present

## 2022-04-16 DIAGNOSIS — I517 Cardiomegaly: Secondary | ICD-10-CM | POA: Diagnosis not present

## 2022-04-16 DIAGNOSIS — R570 Cardiogenic shock: Secondary | ICD-10-CM | POA: Diagnosis not present

## 2022-04-16 DIAGNOSIS — I428 Other cardiomyopathies: Secondary | ICD-10-CM | POA: Diagnosis not present

## 2022-04-16 DIAGNOSIS — I351 Nonrheumatic aortic (valve) insufficiency: Secondary | ICD-10-CM | POA: Diagnosis not present

## 2022-04-16 DIAGNOSIS — Z01818 Encounter for other preprocedural examination: Secondary | ICD-10-CM | POA: Diagnosis not present

## 2022-04-16 DIAGNOSIS — I10 Essential (primary) hypertension: Secondary | ICD-10-CM | POA: Diagnosis not present

## 2022-04-17 DIAGNOSIS — Z01818 Encounter for other preprocedural examination: Secondary | ICD-10-CM | POA: Diagnosis not present

## 2022-04-17 DIAGNOSIS — I519 Heart disease, unspecified: Secondary | ICD-10-CM | POA: Diagnosis not present

## 2022-04-17 DIAGNOSIS — I351 Nonrheumatic aortic (valve) insufficiency: Secondary | ICD-10-CM | POA: Diagnosis not present

## 2022-04-17 DIAGNOSIS — I428 Other cardiomyopathies: Secondary | ICD-10-CM | POA: Diagnosis not present

## 2022-04-17 DIAGNOSIS — R570 Cardiogenic shock: Secondary | ICD-10-CM | POA: Diagnosis not present

## 2022-04-18 DIAGNOSIS — R0989 Other specified symptoms and signs involving the circulatory and respiratory systems: Secondary | ICD-10-CM | POA: Diagnosis not present

## 2022-04-18 DIAGNOSIS — I519 Heart disease, unspecified: Secondary | ICD-10-CM | POA: Diagnosis not present

## 2022-04-18 DIAGNOSIS — I351 Nonrheumatic aortic (valve) insufficiency: Secondary | ICD-10-CM | POA: Diagnosis not present

## 2022-04-18 DIAGNOSIS — J90 Pleural effusion, not elsewhere classified: Secondary | ICD-10-CM | POA: Diagnosis not present

## 2022-04-18 DIAGNOSIS — Z452 Encounter for adjustment and management of vascular access device: Secondary | ICD-10-CM | POA: Diagnosis not present

## 2022-04-18 DIAGNOSIS — I4891 Unspecified atrial fibrillation: Secondary | ICD-10-CM | POA: Diagnosis not present

## 2022-04-18 DIAGNOSIS — R570 Cardiogenic shock: Secondary | ICD-10-CM | POA: Diagnosis not present

## 2022-04-18 DIAGNOSIS — I5043 Acute on chronic combined systolic (congestive) and diastolic (congestive) heart failure: Secondary | ICD-10-CM | POA: Diagnosis not present

## 2022-04-19 DIAGNOSIS — I519 Heart disease, unspecified: Secondary | ICD-10-CM | POA: Diagnosis not present

## 2022-04-19 DIAGNOSIS — Z452 Encounter for adjustment and management of vascular access device: Secondary | ICD-10-CM | POA: Diagnosis not present

## 2022-04-19 DIAGNOSIS — I4891 Unspecified atrial fibrillation: Secondary | ICD-10-CM | POA: Diagnosis not present

## 2022-04-19 DIAGNOSIS — I5043 Acute on chronic combined systolic (congestive) and diastolic (congestive) heart failure: Secondary | ICD-10-CM | POA: Diagnosis not present

## 2022-04-19 DIAGNOSIS — R570 Cardiogenic shock: Secondary | ICD-10-CM | POA: Diagnosis not present

## 2022-04-19 DIAGNOSIS — I351 Nonrheumatic aortic (valve) insufficiency: Secondary | ICD-10-CM | POA: Diagnosis not present

## 2022-04-20 DIAGNOSIS — Z452 Encounter for adjustment and management of vascular access device: Secondary | ICD-10-CM | POA: Diagnosis not present

## 2022-04-20 DIAGNOSIS — I4891 Unspecified atrial fibrillation: Secondary | ICD-10-CM | POA: Diagnosis not present

## 2022-04-20 DIAGNOSIS — J90 Pleural effusion, not elsewhere classified: Secondary | ICD-10-CM | POA: Diagnosis not present

## 2022-04-20 DIAGNOSIS — I428 Other cardiomyopathies: Secondary | ICD-10-CM | POA: Diagnosis not present

## 2022-04-20 DIAGNOSIS — I5043 Acute on chronic combined systolic (congestive) and diastolic (congestive) heart failure: Secondary | ICD-10-CM | POA: Diagnosis not present

## 2022-04-20 DIAGNOSIS — J811 Chronic pulmonary edema: Secondary | ICD-10-CM | POA: Diagnosis not present

## 2022-04-20 DIAGNOSIS — R570 Cardiogenic shock: Secondary | ICD-10-CM | POA: Diagnosis not present

## 2022-04-20 DIAGNOSIS — I351 Nonrheumatic aortic (valve) insufficiency: Secondary | ICD-10-CM | POA: Diagnosis not present

## 2022-04-20 DIAGNOSIS — Z95811 Presence of heart assist device: Secondary | ICD-10-CM | POA: Diagnosis not present

## 2022-04-20 DIAGNOSIS — J9811 Atelectasis: Secondary | ICD-10-CM | POA: Diagnosis not present

## 2022-04-20 DIAGNOSIS — I519 Heart disease, unspecified: Secondary | ICD-10-CM | POA: Diagnosis not present

## 2022-04-21 DIAGNOSIS — I4891 Unspecified atrial fibrillation: Secondary | ICD-10-CM | POA: Diagnosis not present

## 2022-04-21 DIAGNOSIS — R200 Anesthesia of skin: Secondary | ICD-10-CM | POA: Diagnosis not present

## 2022-04-21 DIAGNOSIS — I519 Heart disease, unspecified: Secondary | ICD-10-CM | POA: Diagnosis not present

## 2022-04-21 DIAGNOSIS — R570 Cardiogenic shock: Secondary | ICD-10-CM | POA: Diagnosis not present

## 2022-04-21 DIAGNOSIS — M7989 Other specified soft tissue disorders: Secondary | ICD-10-CM | POA: Diagnosis not present

## 2022-04-21 DIAGNOSIS — I351 Nonrheumatic aortic (valve) insufficiency: Secondary | ICD-10-CM | POA: Diagnosis not present

## 2022-04-22 DIAGNOSIS — R570 Cardiogenic shock: Secondary | ICD-10-CM | POA: Diagnosis not present

## 2022-04-22 DIAGNOSIS — I4891 Unspecified atrial fibrillation: Secondary | ICD-10-CM | POA: Diagnosis not present

## 2022-04-22 DIAGNOSIS — I351 Nonrheumatic aortic (valve) insufficiency: Secondary | ICD-10-CM | POA: Diagnosis not present

## 2022-04-22 DIAGNOSIS — I519 Heart disease, unspecified: Secondary | ICD-10-CM | POA: Diagnosis not present

## 2022-04-23 DIAGNOSIS — I351 Nonrheumatic aortic (valve) insufficiency: Secondary | ICD-10-CM | POA: Diagnosis not present

## 2022-04-23 DIAGNOSIS — R570 Cardiogenic shock: Secondary | ICD-10-CM | POA: Diagnosis not present

## 2022-04-23 DIAGNOSIS — I361 Nonrheumatic tricuspid (valve) insufficiency: Secondary | ICD-10-CM | POA: Diagnosis not present

## 2022-04-23 DIAGNOSIS — I519 Heart disease, unspecified: Secondary | ICD-10-CM | POA: Diagnosis not present

## 2022-04-23 DIAGNOSIS — Z01818 Encounter for other preprocedural examination: Secondary | ICD-10-CM | POA: Diagnosis not present

## 2022-04-24 DIAGNOSIS — I428 Other cardiomyopathies: Secondary | ICD-10-CM | POA: Diagnosis not present

## 2022-04-24 DIAGNOSIS — I519 Heart disease, unspecified: Secondary | ICD-10-CM | POA: Diagnosis not present

## 2022-04-24 DIAGNOSIS — I97638 Postprocedural hematoma of a circulatory system organ or structure following other circulatory system procedure: Secondary | ICD-10-CM | POA: Diagnosis not present

## 2022-04-24 DIAGNOSIS — R570 Cardiogenic shock: Secondary | ICD-10-CM | POA: Diagnosis not present

## 2022-04-25 DIAGNOSIS — R570 Cardiogenic shock: Secondary | ICD-10-CM | POA: Diagnosis not present

## 2022-04-25 DIAGNOSIS — I97638 Postprocedural hematoma of a circulatory system organ or structure following other circulatory system procedure: Secondary | ICD-10-CM | POA: Diagnosis not present

## 2022-04-25 DIAGNOSIS — I428 Other cardiomyopathies: Secondary | ICD-10-CM | POA: Diagnosis not present

## 2022-04-25 DIAGNOSIS — I340 Nonrheumatic mitral (valve) insufficiency: Secondary | ICD-10-CM | POA: Diagnosis not present

## 2022-04-26 DIAGNOSIS — R570 Cardiogenic shock: Secondary | ICD-10-CM | POA: Diagnosis not present

## 2022-04-26 DIAGNOSIS — I340 Nonrheumatic mitral (valve) insufficiency: Secondary | ICD-10-CM | POA: Diagnosis not present

## 2022-04-26 DIAGNOSIS — I97638 Postprocedural hematoma of a circulatory system organ or structure following other circulatory system procedure: Secondary | ICD-10-CM | POA: Diagnosis not present

## 2022-04-26 DIAGNOSIS — I428 Other cardiomyopathies: Secondary | ICD-10-CM | POA: Diagnosis not present

## 2022-04-27 DIAGNOSIS — J90 Pleural effusion, not elsewhere classified: Secondary | ICD-10-CM | POA: Diagnosis not present

## 2022-04-27 DIAGNOSIS — R918 Other nonspecific abnormal finding of lung field: Secondary | ICD-10-CM | POA: Diagnosis not present

## 2022-04-27 DIAGNOSIS — Z452 Encounter for adjustment and management of vascular access device: Secondary | ICD-10-CM | POA: Diagnosis not present

## 2022-04-27 DIAGNOSIS — R570 Cardiogenic shock: Secondary | ICD-10-CM | POA: Diagnosis not present

## 2022-04-27 DIAGNOSIS — Z95811 Presence of heart assist device: Secondary | ICD-10-CM | POA: Diagnosis not present

## 2022-04-28 DIAGNOSIS — Q2112 Patent foramen ovale: Secondary | ICD-10-CM | POA: Diagnosis not present

## 2022-04-28 DIAGNOSIS — Z4682 Encounter for fitting and adjustment of non-vascular catheter: Secondary | ICD-10-CM | POA: Diagnosis not present

## 2022-04-28 DIAGNOSIS — I7122 Aneurysm of the aortic arch, without rupture: Secondary | ICD-10-CM | POA: Diagnosis not present

## 2022-04-28 DIAGNOSIS — I700 Atherosclerosis of aorta: Secondary | ICD-10-CM | POA: Diagnosis not present

## 2022-04-28 DIAGNOSIS — I719 Aortic aneurysm of unspecified site, without rupture: Secondary | ICD-10-CM | POA: Diagnosis not present

## 2022-04-28 DIAGNOSIS — J90 Pleural effusion, not elsewhere classified: Secondary | ICD-10-CM | POA: Diagnosis not present

## 2022-04-28 DIAGNOSIS — I340 Nonrheumatic mitral (valve) insufficiency: Secondary | ICD-10-CM | POA: Diagnosis not present

## 2022-04-28 DIAGNOSIS — I2510 Atherosclerotic heart disease of native coronary artery without angina pectoris: Secondary | ICD-10-CM | POA: Diagnosis not present

## 2022-04-28 DIAGNOSIS — I7789 Other specified disorders of arteries and arterioles: Secondary | ICD-10-CM | POA: Diagnosis not present

## 2022-04-28 DIAGNOSIS — I5043 Acute on chronic combined systolic (congestive) and diastolic (congestive) heart failure: Secondary | ICD-10-CM | POA: Diagnosis not present

## 2022-04-28 DIAGNOSIS — R918 Other nonspecific abnormal finding of lung field: Secondary | ICD-10-CM | POA: Diagnosis not present

## 2022-04-28 DIAGNOSIS — J9811 Atelectasis: Secondary | ICD-10-CM | POA: Diagnosis not present

## 2022-04-28 DIAGNOSIS — I7121 Aneurysm of the ascending aorta, without rupture: Secondary | ICD-10-CM | POA: Diagnosis not present

## 2022-04-28 DIAGNOSIS — Z4509 Encounter for adjustment and management of other cardiac device: Secondary | ICD-10-CM | POA: Diagnosis not present

## 2022-04-28 DIAGNOSIS — I420 Dilated cardiomyopathy: Secondary | ICD-10-CM | POA: Diagnosis not present

## 2022-04-28 DIAGNOSIS — I5084 End stage heart failure: Secondary | ICD-10-CM | POA: Diagnosis not present

## 2022-04-28 DIAGNOSIS — I517 Cardiomegaly: Secondary | ICD-10-CM | POA: Diagnosis not present

## 2022-04-29 DIAGNOSIS — T380X5A Adverse effect of glucocorticoids and synthetic analogues, initial encounter: Secondary | ICD-10-CM | POA: Diagnosis not present

## 2022-04-29 DIAGNOSIS — Z79899 Other long term (current) drug therapy: Secondary | ICD-10-CM | POA: Diagnosis not present

## 2022-04-29 DIAGNOSIS — D62 Acute posthemorrhagic anemia: Secondary | ICD-10-CM | POA: Diagnosis not present

## 2022-04-29 DIAGNOSIS — E1165 Type 2 diabetes mellitus with hyperglycemia: Secondary | ICD-10-CM | POA: Diagnosis not present

## 2022-04-29 DIAGNOSIS — Z01818 Encounter for other preprocedural examination: Secondary | ICD-10-CM | POA: Diagnosis not present

## 2022-04-29 DIAGNOSIS — I5043 Acute on chronic combined systolic (congestive) and diastolic (congestive) heart failure: Secondary | ICD-10-CM | POA: Diagnosis not present

## 2022-04-29 DIAGNOSIS — J951 Acute pulmonary insufficiency following thoracic surgery: Secondary | ICD-10-CM | POA: Diagnosis not present

## 2022-04-29 DIAGNOSIS — D849 Immunodeficiency, unspecified: Secondary | ICD-10-CM | POA: Diagnosis not present

## 2022-04-29 DIAGNOSIS — J9811 Atelectasis: Secondary | ICD-10-CM | POA: Diagnosis not present

## 2022-04-29 DIAGNOSIS — Z4682 Encounter for fitting and adjustment of non-vascular catheter: Secondary | ICD-10-CM | POA: Diagnosis not present

## 2022-04-29 DIAGNOSIS — Z005 Encounter for examination of potential donor of organ and tissue: Secondary | ICD-10-CM | POA: Diagnosis not present

## 2022-04-29 DIAGNOSIS — G8918 Other acute postprocedural pain: Secondary | ICD-10-CM | POA: Diagnosis not present

## 2022-04-29 DIAGNOSIS — R570 Cardiogenic shock: Secondary | ICD-10-CM | POA: Diagnosis not present

## 2022-04-29 DIAGNOSIS — Z941 Heart transplant status: Secondary | ICD-10-CM | POA: Diagnosis not present

## 2022-04-29 DIAGNOSIS — A539 Syphilis, unspecified: Secondary | ICD-10-CM | POA: Diagnosis not present

## 2022-04-30 DIAGNOSIS — Z79899 Other long term (current) drug therapy: Secondary | ICD-10-CM | POA: Diagnosis not present

## 2022-04-30 DIAGNOSIS — R570 Cardiogenic shock: Secondary | ICD-10-CM | POA: Diagnosis not present

## 2022-04-30 DIAGNOSIS — E1165 Type 2 diabetes mellitus with hyperglycemia: Secondary | ICD-10-CM | POA: Diagnosis not present

## 2022-04-30 DIAGNOSIS — Z941 Heart transplant status: Secondary | ICD-10-CM | POA: Diagnosis not present

## 2022-04-30 DIAGNOSIS — J811 Chronic pulmonary edema: Secondary | ICD-10-CM | POA: Diagnosis not present

## 2022-04-30 DIAGNOSIS — T380X5A Adverse effect of glucocorticoids and synthetic analogues, initial encounter: Secondary | ICD-10-CM | POA: Diagnosis not present

## 2022-04-30 DIAGNOSIS — I5043 Acute on chronic combined systolic (congestive) and diastolic (congestive) heart failure: Secondary | ICD-10-CM | POA: Diagnosis not present

## 2022-04-30 DIAGNOSIS — Z005 Encounter for examination of potential donor of organ and tissue: Secondary | ICD-10-CM | POA: Diagnosis not present

## 2022-04-30 DIAGNOSIS — I272 Pulmonary hypertension, unspecified: Secondary | ICD-10-CM | POA: Diagnosis not present

## 2022-04-30 DIAGNOSIS — Z01818 Encounter for other preprocedural examination: Secondary | ICD-10-CM | POA: Diagnosis not present

## 2022-04-30 DIAGNOSIS — I50811 Acute right heart failure: Secondary | ICD-10-CM | POA: Diagnosis not present

## 2022-04-30 DIAGNOSIS — D849 Immunodeficiency, unspecified: Secondary | ICD-10-CM | POA: Diagnosis not present

## 2022-05-01 DIAGNOSIS — Z005 Encounter for examination of potential donor of organ and tissue: Secondary | ICD-10-CM | POA: Diagnosis not present

## 2022-05-01 DIAGNOSIS — Z95818 Presence of other cardiac implants and grafts: Secondary | ICD-10-CM | POA: Diagnosis not present

## 2022-05-01 DIAGNOSIS — A539 Syphilis, unspecified: Secondary | ICD-10-CM | POA: Diagnosis not present

## 2022-05-01 DIAGNOSIS — R570 Cardiogenic shock: Secondary | ICD-10-CM | POA: Diagnosis not present

## 2022-05-01 DIAGNOSIS — Z952 Presence of prosthetic heart valve: Secondary | ICD-10-CM | POA: Diagnosis not present

## 2022-05-01 DIAGNOSIS — D849 Immunodeficiency, unspecified: Secondary | ICD-10-CM | POA: Diagnosis not present

## 2022-05-01 DIAGNOSIS — J9811 Atelectasis: Secondary | ICD-10-CM | POA: Diagnosis not present

## 2022-05-01 DIAGNOSIS — Z95811 Presence of heart assist device: Secondary | ICD-10-CM | POA: Diagnosis not present

## 2022-05-01 DIAGNOSIS — Z79899 Other long term (current) drug therapy: Secondary | ICD-10-CM | POA: Diagnosis not present

## 2022-05-01 DIAGNOSIS — J811 Chronic pulmonary edema: Secondary | ICD-10-CM | POA: Diagnosis not present

## 2022-05-01 DIAGNOSIS — J939 Pneumothorax, unspecified: Secondary | ICD-10-CM | POA: Diagnosis not present

## 2022-05-01 DIAGNOSIS — I517 Cardiomegaly: Secondary | ICD-10-CM | POA: Diagnosis not present

## 2022-05-01 DIAGNOSIS — D72829 Elevated white blood cell count, unspecified: Secondary | ICD-10-CM | POA: Diagnosis not present

## 2022-05-01 DIAGNOSIS — Z941 Heart transplant status: Secondary | ICD-10-CM | POA: Diagnosis not present

## 2022-05-01 DIAGNOSIS — T380X5A Adverse effect of glucocorticoids and synthetic analogues, initial encounter: Secondary | ICD-10-CM | POA: Diagnosis not present

## 2022-05-01 DIAGNOSIS — E1165 Type 2 diabetes mellitus with hyperglycemia: Secondary | ICD-10-CM | POA: Diagnosis not present

## 2022-05-01 DIAGNOSIS — R918 Other nonspecific abnormal finding of lung field: Secondary | ICD-10-CM | POA: Diagnosis not present

## 2022-05-01 DIAGNOSIS — D62 Acute posthemorrhagic anemia: Secondary | ICD-10-CM | POA: Diagnosis not present

## 2022-05-01 DIAGNOSIS — E0965 Drug or chemical induced diabetes mellitus with hyperglycemia: Secondary | ICD-10-CM | POA: Diagnosis not present

## 2022-05-01 DIAGNOSIS — Z01818 Encounter for other preprocedural examination: Secondary | ICD-10-CM | POA: Diagnosis not present

## 2022-05-02 DIAGNOSIS — D72829 Elevated white blood cell count, unspecified: Secondary | ICD-10-CM | POA: Diagnosis not present

## 2022-05-02 DIAGNOSIS — E0965 Drug or chemical induced diabetes mellitus with hyperglycemia: Secondary | ICD-10-CM | POA: Diagnosis not present

## 2022-05-02 DIAGNOSIS — D849 Immunodeficiency, unspecified: Secondary | ICD-10-CM | POA: Diagnosis not present

## 2022-05-02 DIAGNOSIS — Z9889 Other specified postprocedural states: Secondary | ICD-10-CM | POA: Diagnosis not present

## 2022-05-02 DIAGNOSIS — Z4682 Encounter for fitting and adjustment of non-vascular catheter: Secondary | ICD-10-CM | POA: Diagnosis not present

## 2022-05-02 DIAGNOSIS — Z941 Heart transplant status: Secondary | ICD-10-CM | POA: Diagnosis not present

## 2022-05-02 DIAGNOSIS — J9811 Atelectasis: Secondary | ICD-10-CM | POA: Diagnosis not present

## 2022-05-02 DIAGNOSIS — D62 Acute posthemorrhagic anemia: Secondary | ICD-10-CM | POA: Diagnosis not present

## 2022-05-02 DIAGNOSIS — Z452 Encounter for adjustment and management of vascular access device: Secondary | ICD-10-CM | POA: Diagnosis not present

## 2022-05-02 DIAGNOSIS — T380X5A Adverse effect of glucocorticoids and synthetic analogues, initial encounter: Secondary | ICD-10-CM | POA: Diagnosis not present

## 2022-05-02 DIAGNOSIS — Z8679 Personal history of other diseases of the circulatory system: Secondary | ICD-10-CM | POA: Diagnosis not present

## 2022-05-02 DIAGNOSIS — Z01818 Encounter for other preprocedural examination: Secondary | ICD-10-CM | POA: Diagnosis not present

## 2022-05-02 DIAGNOSIS — J90 Pleural effusion, not elsewhere classified: Secondary | ICD-10-CM | POA: Diagnosis not present

## 2022-05-03 DIAGNOSIS — D62 Acute posthemorrhagic anemia: Secondary | ICD-10-CM | POA: Diagnosis not present

## 2022-05-03 DIAGNOSIS — Z01818 Encounter for other preprocedural examination: Secondary | ICD-10-CM | POA: Diagnosis not present

## 2022-05-03 DIAGNOSIS — G8918 Other acute postprocedural pain: Secondary | ICD-10-CM | POA: Diagnosis not present

## 2022-05-03 DIAGNOSIS — Z452 Encounter for adjustment and management of vascular access device: Secondary | ICD-10-CM | POA: Diagnosis not present

## 2022-05-03 DIAGNOSIS — D849 Immunodeficiency, unspecified: Secondary | ICD-10-CM | POA: Diagnosis not present

## 2022-05-03 DIAGNOSIS — Z95828 Presence of other vascular implants and grafts: Secondary | ICD-10-CM | POA: Diagnosis not present

## 2022-05-03 DIAGNOSIS — J939 Pneumothorax, unspecified: Secondary | ICD-10-CM | POA: Diagnosis not present

## 2022-05-03 DIAGNOSIS — Z9889 Other specified postprocedural states: Secondary | ICD-10-CM | POA: Diagnosis not present

## 2022-05-03 DIAGNOSIS — Z8679 Personal history of other diseases of the circulatory system: Secondary | ICD-10-CM | POA: Diagnosis not present

## 2022-05-03 DIAGNOSIS — Z941 Heart transplant status: Secondary | ICD-10-CM | POA: Diagnosis not present

## 2022-05-04 DIAGNOSIS — D849 Immunodeficiency, unspecified: Secondary | ICD-10-CM | POA: Diagnosis not present

## 2022-05-04 DIAGNOSIS — Z941 Heart transplant status: Secondary | ICD-10-CM | POA: Diagnosis not present

## 2022-05-04 DIAGNOSIS — Z9889 Other specified postprocedural states: Secondary | ICD-10-CM | POA: Diagnosis not present

## 2022-05-04 DIAGNOSIS — R918 Other nonspecific abnormal finding of lung field: Secondary | ICD-10-CM | POA: Diagnosis not present

## 2022-05-04 DIAGNOSIS — E1165 Type 2 diabetes mellitus with hyperglycemia: Secondary | ICD-10-CM | POA: Diagnosis not present

## 2022-05-04 DIAGNOSIS — Z01818 Encounter for other preprocedural examination: Secondary | ICD-10-CM | POA: Diagnosis not present

## 2022-05-04 DIAGNOSIS — T380X5A Adverse effect of glucocorticoids and synthetic analogues, initial encounter: Secondary | ICD-10-CM | POA: Diagnosis not present

## 2022-05-04 DIAGNOSIS — J939 Pneumothorax, unspecified: Secondary | ICD-10-CM | POA: Diagnosis not present

## 2022-05-04 DIAGNOSIS — Z8679 Personal history of other diseases of the circulatory system: Secondary | ICD-10-CM | POA: Diagnosis not present

## 2022-05-05 DIAGNOSIS — Z01818 Encounter for other preprocedural examination: Secondary | ICD-10-CM | POA: Diagnosis not present

## 2022-05-05 DIAGNOSIS — D849 Immunodeficiency, unspecified: Secondary | ICD-10-CM | POA: Diagnosis not present

## 2022-05-05 DIAGNOSIS — J9811 Atelectasis: Secondary | ICD-10-CM | POA: Diagnosis not present

## 2022-05-05 DIAGNOSIS — N1832 Chronic kidney disease, stage 3b: Secondary | ICD-10-CM | POA: Diagnosis not present

## 2022-05-05 DIAGNOSIS — Z8679 Personal history of other diseases of the circulatory system: Secondary | ICD-10-CM | POA: Diagnosis not present

## 2022-05-05 DIAGNOSIS — J939 Pneumothorax, unspecified: Secondary | ICD-10-CM | POA: Diagnosis not present

## 2022-05-05 DIAGNOSIS — T86298 Other complications of heart transplant: Secondary | ICD-10-CM | POA: Diagnosis not present

## 2022-05-05 DIAGNOSIS — E1122 Type 2 diabetes mellitus with diabetic chronic kidney disease: Secondary | ICD-10-CM | POA: Diagnosis not present

## 2022-05-05 DIAGNOSIS — Z9889 Other specified postprocedural states: Secondary | ICD-10-CM | POA: Diagnosis not present

## 2022-05-05 DIAGNOSIS — Z941 Heart transplant status: Secondary | ICD-10-CM | POA: Diagnosis not present

## 2022-05-05 DIAGNOSIS — I423 Endomyocardial (eosinophilic) disease: Secondary | ICD-10-CM | POA: Diagnosis not present

## 2022-05-05 DIAGNOSIS — Z4821 Encounter for aftercare following heart transplant: Secondary | ICD-10-CM | POA: Diagnosis not present

## 2022-05-06 DIAGNOSIS — Z01818 Encounter for other preprocedural examination: Secondary | ICD-10-CM | POA: Diagnosis not present

## 2022-05-06 DIAGNOSIS — Z941 Heart transplant status: Secondary | ICD-10-CM | POA: Diagnosis not present

## 2022-05-06 DIAGNOSIS — J9811 Atelectasis: Secondary | ICD-10-CM | POA: Diagnosis not present

## 2022-05-06 DIAGNOSIS — J939 Pneumothorax, unspecified: Secondary | ICD-10-CM | POA: Diagnosis not present

## 2022-05-07 DIAGNOSIS — J9811 Atelectasis: Secondary | ICD-10-CM | POA: Diagnosis not present

## 2022-05-07 DIAGNOSIS — Z7682 Awaiting organ transplant status: Secondary | ICD-10-CM | POA: Diagnosis not present

## 2022-05-07 DIAGNOSIS — Z01818 Encounter for other preprocedural examination: Secondary | ICD-10-CM | POA: Diagnosis not present

## 2022-05-07 DIAGNOSIS — Z4682 Encounter for fitting and adjustment of non-vascular catheter: Secondary | ICD-10-CM | POA: Diagnosis not present

## 2022-05-07 DIAGNOSIS — Z941 Heart transplant status: Secondary | ICD-10-CM | POA: Diagnosis not present

## 2022-05-07 DIAGNOSIS — J948 Other specified pleural conditions: Secondary | ICD-10-CM | POA: Diagnosis not present

## 2022-05-07 DIAGNOSIS — J939 Pneumothorax, unspecified: Secondary | ICD-10-CM | POA: Diagnosis not present

## 2022-05-07 DIAGNOSIS — Z452 Encounter for adjustment and management of vascular access device: Secondary | ICD-10-CM | POA: Diagnosis not present

## 2022-05-08 DIAGNOSIS — J939 Pneumothorax, unspecified: Secondary | ICD-10-CM | POA: Diagnosis not present

## 2022-05-08 DIAGNOSIS — Z4682 Encounter for fitting and adjustment of non-vascular catheter: Secondary | ICD-10-CM | POA: Diagnosis not present

## 2022-05-08 DIAGNOSIS — Z941 Heart transplant status: Secondary | ICD-10-CM | POA: Diagnosis not present

## 2022-05-08 DIAGNOSIS — Z452 Encounter for adjustment and management of vascular access device: Secondary | ICD-10-CM | POA: Diagnosis not present

## 2022-05-08 DIAGNOSIS — I517 Cardiomegaly: Secondary | ICD-10-CM | POA: Diagnosis not present

## 2022-05-09 DIAGNOSIS — R931 Abnormal findings on diagnostic imaging of heart and coronary circulation: Secondary | ICD-10-CM | POA: Diagnosis not present

## 2022-05-09 DIAGNOSIS — Z4682 Encounter for fitting and adjustment of non-vascular catheter: Secondary | ICD-10-CM | POA: Diagnosis not present

## 2022-05-09 DIAGNOSIS — R570 Cardiogenic shock: Secondary | ICD-10-CM | POA: Diagnosis not present

## 2022-05-09 DIAGNOSIS — Z452 Encounter for adjustment and management of vascular access device: Secondary | ICD-10-CM | POA: Diagnosis not present

## 2022-05-09 DIAGNOSIS — R918 Other nonspecific abnormal finding of lung field: Secondary | ICD-10-CM | POA: Diagnosis not present

## 2022-05-10 DIAGNOSIS — Z941 Heart transplant status: Secondary | ICD-10-CM | POA: Diagnosis not present

## 2022-05-10 DIAGNOSIS — J939 Pneumothorax, unspecified: Secondary | ICD-10-CM | POA: Diagnosis not present

## 2022-05-11 DIAGNOSIS — Z941 Heart transplant status: Secondary | ICD-10-CM | POA: Diagnosis not present

## 2022-05-11 DIAGNOSIS — J939 Pneumothorax, unspecified: Secondary | ICD-10-CM | POA: Diagnosis not present

## 2022-05-11 DIAGNOSIS — Z4682 Encounter for fitting and adjustment of non-vascular catheter: Secondary | ICD-10-CM | POA: Diagnosis not present

## 2022-05-11 DIAGNOSIS — T380X5A Adverse effect of glucocorticoids and synthetic analogues, initial encounter: Secondary | ICD-10-CM | POA: Diagnosis not present

## 2022-05-11 DIAGNOSIS — J90 Pleural effusion, not elsewhere classified: Secondary | ICD-10-CM | POA: Diagnosis not present

## 2022-05-11 DIAGNOSIS — Z452 Encounter for adjustment and management of vascular access device: Secondary | ICD-10-CM | POA: Diagnosis not present

## 2022-05-11 DIAGNOSIS — E1165 Type 2 diabetes mellitus with hyperglycemia: Secondary | ICD-10-CM | POA: Diagnosis not present

## 2022-05-12 DIAGNOSIS — J939 Pneumothorax, unspecified: Secondary | ICD-10-CM | POA: Diagnosis not present

## 2022-05-12 DIAGNOSIS — I129 Hypertensive chronic kidney disease with stage 1 through stage 4 chronic kidney disease, or unspecified chronic kidney disease: Secondary | ICD-10-CM | POA: Diagnosis not present

## 2022-05-12 DIAGNOSIS — R600 Localized edema: Secondary | ICD-10-CM | POA: Diagnosis not present

## 2022-05-12 DIAGNOSIS — I517 Cardiomegaly: Secondary | ICD-10-CM | POA: Diagnosis not present

## 2022-05-12 DIAGNOSIS — M79602 Pain in left arm: Secondary | ICD-10-CM | POA: Diagnosis not present

## 2022-05-12 DIAGNOSIS — Z4821 Encounter for aftercare following heart transplant: Secondary | ICD-10-CM | POA: Diagnosis not present

## 2022-05-12 DIAGNOSIS — E1122 Type 2 diabetes mellitus with diabetic chronic kidney disease: Secondary | ICD-10-CM | POA: Diagnosis not present

## 2022-05-12 DIAGNOSIS — J90 Pleural effusion, not elsewhere classified: Secondary | ICD-10-CM | POA: Diagnosis not present

## 2022-05-12 DIAGNOSIS — Z01818 Encounter for other preprocedural examination: Secondary | ICD-10-CM | POA: Diagnosis not present

## 2022-05-12 DIAGNOSIS — Z4682 Encounter for fitting and adjustment of non-vascular catheter: Secondary | ICD-10-CM | POA: Diagnosis not present

## 2022-05-12 DIAGNOSIS — Z941 Heart transplant status: Secondary | ICD-10-CM | POA: Diagnosis not present

## 2022-05-13 DIAGNOSIS — R918 Other nonspecific abnormal finding of lung field: Secondary | ICD-10-CM | POA: Diagnosis not present

## 2022-05-13 DIAGNOSIS — J939 Pneumothorax, unspecified: Secondary | ICD-10-CM | POA: Diagnosis not present

## 2022-05-13 DIAGNOSIS — J90 Pleural effusion, not elsewhere classified: Secondary | ICD-10-CM | POA: Diagnosis not present

## 2022-05-13 DIAGNOSIS — I517 Cardiomegaly: Secondary | ICD-10-CM | POA: Diagnosis not present

## 2022-05-13 DIAGNOSIS — Z452 Encounter for adjustment and management of vascular access device: Secondary | ICD-10-CM | POA: Diagnosis not present

## 2022-05-13 DIAGNOSIS — J948 Other specified pleural conditions: Secondary | ICD-10-CM | POA: Diagnosis not present

## 2022-05-13 DIAGNOSIS — Z01818 Encounter for other preprocedural examination: Secondary | ICD-10-CM | POA: Diagnosis not present

## 2022-05-13 DIAGNOSIS — Z941 Heart transplant status: Secondary | ICD-10-CM | POA: Diagnosis not present

## 2022-05-13 DIAGNOSIS — T8621 Heart transplant rejection: Secondary | ICD-10-CM | POA: Diagnosis not present

## 2022-05-14 DIAGNOSIS — I5043 Acute on chronic combined systolic (congestive) and diastolic (congestive) heart failure: Secondary | ICD-10-CM | POA: Diagnosis not present

## 2022-05-14 DIAGNOSIS — R918 Other nonspecific abnormal finding of lung field: Secondary | ICD-10-CM | POA: Diagnosis not present

## 2022-05-14 DIAGNOSIS — Z941 Heart transplant status: Secondary | ICD-10-CM | POA: Diagnosis not present

## 2022-05-14 DIAGNOSIS — Z01818 Encounter for other preprocedural examination: Secondary | ICD-10-CM | POA: Diagnosis not present

## 2022-05-14 DIAGNOSIS — J90 Pleural effusion, not elsewhere classified: Secondary | ICD-10-CM | POA: Diagnosis not present

## 2022-05-14 DIAGNOSIS — Z452 Encounter for adjustment and management of vascular access device: Secondary | ICD-10-CM | POA: Diagnosis not present

## 2022-05-14 DIAGNOSIS — N179 Acute kidney failure, unspecified: Secondary | ICD-10-CM | POA: Diagnosis not present

## 2022-05-14 DIAGNOSIS — J939 Pneumothorax, unspecified: Secondary | ICD-10-CM | POA: Diagnosis not present

## 2022-05-15 DIAGNOSIS — R570 Cardiogenic shock: Secondary | ICD-10-CM | POA: Diagnosis not present

## 2022-05-15 DIAGNOSIS — R918 Other nonspecific abnormal finding of lung field: Secondary | ICD-10-CM | POA: Diagnosis not present

## 2022-05-15 DIAGNOSIS — Z941 Heart transplant status: Secondary | ICD-10-CM | POA: Diagnosis not present

## 2022-05-15 DIAGNOSIS — Z452 Encounter for adjustment and management of vascular access device: Secondary | ICD-10-CM | POA: Diagnosis not present

## 2022-05-15 DIAGNOSIS — J939 Pneumothorax, unspecified: Secondary | ICD-10-CM | POA: Diagnosis not present

## 2022-05-15 DIAGNOSIS — J90 Pleural effusion, not elsewhere classified: Secondary | ICD-10-CM | POA: Diagnosis not present

## 2022-05-15 DIAGNOSIS — I517 Cardiomegaly: Secondary | ICD-10-CM | POA: Diagnosis not present

## 2022-05-15 DIAGNOSIS — J948 Other specified pleural conditions: Secondary | ICD-10-CM | POA: Diagnosis not present

## 2022-05-15 DIAGNOSIS — N179 Acute kidney failure, unspecified: Secondary | ICD-10-CM | POA: Diagnosis not present

## 2022-05-16 DIAGNOSIS — I428 Other cardiomyopathies: Secondary | ICD-10-CM | POA: Diagnosis not present

## 2022-05-16 DIAGNOSIS — T86298 Other complications of heart transplant: Secondary | ICD-10-CM | POA: Diagnosis not present

## 2022-05-16 DIAGNOSIS — I519 Heart disease, unspecified: Secondary | ICD-10-CM | POA: Diagnosis not present

## 2022-05-16 DIAGNOSIS — Z941 Heart transplant status: Secondary | ICD-10-CM | POA: Diagnosis not present

## 2022-05-16 DIAGNOSIS — Z4682 Encounter for fitting and adjustment of non-vascular catheter: Secondary | ICD-10-CM | POA: Diagnosis not present

## 2022-05-16 DIAGNOSIS — J90 Pleural effusion, not elsewhere classified: Secondary | ICD-10-CM | POA: Diagnosis not present

## 2022-05-16 DIAGNOSIS — N179 Acute kidney failure, unspecified: Secondary | ICD-10-CM | POA: Diagnosis not present

## 2022-05-17 DIAGNOSIS — I519 Heart disease, unspecified: Secondary | ICD-10-CM | POA: Diagnosis not present

## 2022-05-17 DIAGNOSIS — I428 Other cardiomyopathies: Secondary | ICD-10-CM | POA: Diagnosis not present

## 2022-05-17 DIAGNOSIS — R931 Abnormal findings on diagnostic imaging of heart and coronary circulation: Secondary | ICD-10-CM | POA: Diagnosis not present

## 2022-05-17 DIAGNOSIS — J90 Pleural effusion, not elsewhere classified: Secondary | ICD-10-CM | POA: Diagnosis not present

## 2022-05-17 DIAGNOSIS — Z941 Heart transplant status: Secondary | ICD-10-CM | POA: Diagnosis not present

## 2022-05-17 DIAGNOSIS — Z95811 Presence of heart assist device: Secondary | ICD-10-CM | POA: Diagnosis not present

## 2022-05-17 DIAGNOSIS — T86298 Other complications of heart transplant: Secondary | ICD-10-CM | POA: Diagnosis not present

## 2022-05-17 DIAGNOSIS — J939 Pneumothorax, unspecified: Secondary | ICD-10-CM | POA: Diagnosis not present

## 2022-05-18 DIAGNOSIS — N179 Acute kidney failure, unspecified: Secondary | ICD-10-CM | POA: Diagnosis not present

## 2022-05-18 DIAGNOSIS — I5189 Other ill-defined heart diseases: Secondary | ICD-10-CM | POA: Diagnosis not present

## 2022-05-18 DIAGNOSIS — J939 Pneumothorax, unspecified: Secondary | ICD-10-CM | POA: Diagnosis not present

## 2022-05-18 DIAGNOSIS — D849 Immunodeficiency, unspecified: Secondary | ICD-10-CM | POA: Diagnosis not present

## 2022-05-18 DIAGNOSIS — I5043 Acute on chronic combined systolic (congestive) and diastolic (congestive) heart failure: Secondary | ICD-10-CM | POA: Diagnosis not present

## 2022-05-18 DIAGNOSIS — R918 Other nonspecific abnormal finding of lung field: Secondary | ICD-10-CM | POA: Diagnosis not present

## 2022-05-18 DIAGNOSIS — Z941 Heart transplant status: Secondary | ICD-10-CM | POA: Diagnosis not present

## 2022-05-18 DIAGNOSIS — Z452 Encounter for adjustment and management of vascular access device: Secondary | ICD-10-CM | POA: Diagnosis not present

## 2022-05-19 DIAGNOSIS — I517 Cardiomegaly: Secondary | ICD-10-CM | POA: Diagnosis not present

## 2022-05-19 DIAGNOSIS — I5043 Acute on chronic combined systolic (congestive) and diastolic (congestive) heart failure: Secondary | ICD-10-CM | POA: Diagnosis not present

## 2022-05-19 DIAGNOSIS — N179 Acute kidney failure, unspecified: Secondary | ICD-10-CM | POA: Diagnosis not present

## 2022-05-19 DIAGNOSIS — I2119 ST elevation (STEMI) myocardial infarction involving other coronary artery of inferior wall: Secondary | ICD-10-CM | POA: Diagnosis not present

## 2022-05-19 DIAGNOSIS — E1165 Type 2 diabetes mellitus with hyperglycemia: Secondary | ICD-10-CM | POA: Diagnosis not present

## 2022-05-19 DIAGNOSIS — I5189 Other ill-defined heart diseases: Secondary | ICD-10-CM | POA: Diagnosis not present

## 2022-05-19 DIAGNOSIS — Z941 Heart transplant status: Secondary | ICD-10-CM | POA: Diagnosis not present

## 2022-05-19 DIAGNOSIS — I340 Nonrheumatic mitral (valve) insufficiency: Secondary | ICD-10-CM | POA: Diagnosis not present

## 2022-05-19 DIAGNOSIS — D849 Immunodeficiency, unspecified: Secondary | ICD-10-CM | POA: Diagnosis not present

## 2022-05-19 DIAGNOSIS — I361 Nonrheumatic tricuspid (valve) insufficiency: Secondary | ICD-10-CM | POA: Diagnosis not present

## 2022-05-20 DIAGNOSIS — Z941 Heart transplant status: Secondary | ICD-10-CM | POA: Diagnosis not present

## 2022-05-20 DIAGNOSIS — I5189 Other ill-defined heart diseases: Secondary | ICD-10-CM | POA: Diagnosis not present

## 2022-05-20 DIAGNOSIS — N179 Acute kidney failure, unspecified: Secondary | ICD-10-CM | POA: Diagnosis not present

## 2022-05-20 DIAGNOSIS — D849 Immunodeficiency, unspecified: Secondary | ICD-10-CM | POA: Diagnosis not present

## 2022-05-20 DIAGNOSIS — E1165 Type 2 diabetes mellitus with hyperglycemia: Secondary | ICD-10-CM | POA: Diagnosis not present

## 2022-05-21 DIAGNOSIS — E1165 Type 2 diabetes mellitus with hyperglycemia: Secondary | ICD-10-CM | POA: Diagnosis not present

## 2022-05-21 DIAGNOSIS — I5189 Other ill-defined heart diseases: Secondary | ICD-10-CM | POA: Diagnosis not present

## 2022-05-21 DIAGNOSIS — Z941 Heart transplant status: Secondary | ICD-10-CM | POA: Diagnosis not present

## 2022-05-21 DIAGNOSIS — D849 Immunodeficiency, unspecified: Secondary | ICD-10-CM | POA: Diagnosis not present

## 2022-05-21 DIAGNOSIS — N179 Acute kidney failure, unspecified: Secondary | ICD-10-CM | POA: Diagnosis not present

## 2022-05-22 DIAGNOSIS — E1165 Type 2 diabetes mellitus with hyperglycemia: Secondary | ICD-10-CM | POA: Diagnosis not present

## 2022-05-22 DIAGNOSIS — Z941 Heart transplant status: Secondary | ICD-10-CM | POA: Diagnosis not present

## 2022-05-22 DIAGNOSIS — I5189 Other ill-defined heart diseases: Secondary | ICD-10-CM | POA: Diagnosis not present

## 2022-05-22 DIAGNOSIS — D849 Immunodeficiency, unspecified: Secondary | ICD-10-CM | POA: Diagnosis not present

## 2022-05-25 DIAGNOSIS — E1122 Type 2 diabetes mellitus with diabetic chronic kidney disease: Secondary | ICD-10-CM | POA: Diagnosis not present

## 2022-05-25 DIAGNOSIS — Z941 Heart transplant status: Secondary | ICD-10-CM | POA: Diagnosis not present

## 2022-05-25 DIAGNOSIS — N1831 Chronic kidney disease, stage 3a: Secondary | ICD-10-CM | POA: Diagnosis not present

## 2022-05-25 DIAGNOSIS — Z4821 Encounter for aftercare following heart transplant: Secondary | ICD-10-CM | POA: Diagnosis not present

## 2022-05-25 DIAGNOSIS — N183 Chronic kidney disease, stage 3 unspecified: Secondary | ICD-10-CM | POA: Diagnosis not present

## 2022-05-25 DIAGNOSIS — Z2989 Encounter for other specified prophylactic measures: Secondary | ICD-10-CM | POA: Diagnosis not present

## 2022-05-25 DIAGNOSIS — Z79899 Other long term (current) drug therapy: Secondary | ICD-10-CM | POA: Diagnosis not present

## 2022-05-25 DIAGNOSIS — Z48298 Encounter for aftercare following other organ transplant: Secondary | ICD-10-CM | POA: Diagnosis not present

## 2022-05-29 DIAGNOSIS — J90 Pleural effusion, not elsewhere classified: Secondary | ICD-10-CM | POA: Diagnosis not present

## 2022-05-29 DIAGNOSIS — Z95828 Presence of other vascular implants and grafts: Secondary | ICD-10-CM | POA: Diagnosis not present

## 2022-05-29 DIAGNOSIS — Z951 Presence of aortocoronary bypass graft: Secondary | ICD-10-CM | POA: Diagnosis not present

## 2022-05-29 DIAGNOSIS — J9811 Atelectasis: Secondary | ICD-10-CM | POA: Diagnosis not present

## 2022-05-29 DIAGNOSIS — Z7682 Awaiting organ transplant status: Secondary | ICD-10-CM | POA: Diagnosis not present

## 2022-05-29 DIAGNOSIS — Z2989 Encounter for other specified prophylactic measures: Secondary | ICD-10-CM | POA: Diagnosis not present

## 2022-05-29 DIAGNOSIS — Z7952 Long term (current) use of systemic steroids: Secondary | ICD-10-CM | POA: Diagnosis not present

## 2022-05-29 DIAGNOSIS — R9431 Abnormal electrocardiogram [ECG] [EKG]: Secondary | ICD-10-CM | POA: Diagnosis not present

## 2022-05-29 DIAGNOSIS — Z4821 Encounter for aftercare following heart transplant: Secondary | ICD-10-CM | POA: Diagnosis not present

## 2022-05-29 DIAGNOSIS — Z79899 Other long term (current) drug therapy: Secondary | ICD-10-CM | POA: Diagnosis not present

## 2022-05-29 DIAGNOSIS — Z7982 Long term (current) use of aspirin: Secondary | ICD-10-CM | POA: Diagnosis not present

## 2022-05-29 DIAGNOSIS — I4510 Unspecified right bundle-branch block: Secondary | ICD-10-CM | POA: Diagnosis not present

## 2022-06-01 ENCOUNTER — Other Ambulatory Visit (HOSPITAL_COMMUNITY): Payer: Self-pay

## 2022-06-01 DIAGNOSIS — Z941 Heart transplant status: Secondary | ICD-10-CM

## 2022-06-08 DIAGNOSIS — E1122 Type 2 diabetes mellitus with diabetic chronic kidney disease: Secondary | ICD-10-CM | POA: Diagnosis not present

## 2022-06-08 DIAGNOSIS — I129 Hypertensive chronic kidney disease with stage 1 through stage 4 chronic kidney disease, or unspecified chronic kidney disease: Secondary | ICD-10-CM | POA: Diagnosis not present

## 2022-06-08 DIAGNOSIS — Z4821 Encounter for aftercare following heart transplant: Secondary | ICD-10-CM | POA: Diagnosis not present

## 2022-06-08 DIAGNOSIS — Z941 Heart transplant status: Secondary | ICD-10-CM | POA: Diagnosis not present

## 2022-06-08 DIAGNOSIS — N183 Chronic kidney disease, stage 3 unspecified: Secondary | ICD-10-CM | POA: Diagnosis not present

## 2022-06-08 DIAGNOSIS — R5383 Other fatigue: Secondary | ICD-10-CM | POA: Diagnosis not present

## 2022-06-08 DIAGNOSIS — Z79899 Other long term (current) drug therapy: Secondary | ICD-10-CM | POA: Diagnosis not present

## 2022-06-08 DIAGNOSIS — Z2989 Encounter for other specified prophylactic measures: Secondary | ICD-10-CM | POA: Diagnosis not present

## 2022-06-08 DIAGNOSIS — Z48298 Encounter for aftercare following other organ transplant: Secondary | ICD-10-CM | POA: Diagnosis not present

## 2022-06-08 DIAGNOSIS — Z7984 Long term (current) use of oral hypoglycemic drugs: Secondary | ICD-10-CM | POA: Diagnosis not present

## 2022-06-22 DIAGNOSIS — R200 Anesthesia of skin: Secondary | ICD-10-CM | POA: Diagnosis not present

## 2022-06-22 DIAGNOSIS — E1122 Type 2 diabetes mellitus with diabetic chronic kidney disease: Secondary | ICD-10-CM | POA: Diagnosis not present

## 2022-06-22 DIAGNOSIS — Z941 Heart transplant status: Secondary | ICD-10-CM | POA: Diagnosis not present

## 2022-06-22 DIAGNOSIS — Z7982 Long term (current) use of aspirin: Secondary | ICD-10-CM | POA: Diagnosis not present

## 2022-06-22 DIAGNOSIS — Z7984 Long term (current) use of oral hypoglycemic drugs: Secondary | ICD-10-CM | POA: Diagnosis not present

## 2022-06-22 DIAGNOSIS — Z79899 Other long term (current) drug therapy: Secondary | ICD-10-CM | POA: Diagnosis not present

## 2022-06-22 DIAGNOSIS — I1310 Hypertensive heart and chronic kidney disease without heart failure, with stage 1 through stage 4 chronic kidney disease, or unspecified chronic kidney disease: Secondary | ICD-10-CM | POA: Diagnosis not present

## 2022-06-22 DIAGNOSIS — Z48298 Encounter for aftercare following other organ transplant: Secondary | ICD-10-CM | POA: Diagnosis not present

## 2022-06-22 DIAGNOSIS — Z2989 Encounter for other specified prophylactic measures: Secondary | ICD-10-CM | POA: Diagnosis not present

## 2022-06-22 DIAGNOSIS — Z4821 Encounter for aftercare following heart transplant: Secondary | ICD-10-CM | POA: Diagnosis not present

## 2022-06-22 DIAGNOSIS — Z7952 Long term (current) use of systemic steroids: Secondary | ICD-10-CM | POA: Diagnosis not present

## 2022-06-22 DIAGNOSIS — N183 Chronic kidney disease, stage 3 unspecified: Secondary | ICD-10-CM | POA: Diagnosis not present

## 2022-07-30 DIAGNOSIS — Z79899 Other long term (current) drug therapy: Secondary | ICD-10-CM | POA: Diagnosis not present

## 2022-07-30 DIAGNOSIS — Z4821 Encounter for aftercare following heart transplant: Secondary | ICD-10-CM | POA: Diagnosis not present

## 2022-07-30 DIAGNOSIS — Z48298 Encounter for aftercare following other organ transplant: Secondary | ICD-10-CM | POA: Diagnosis not present

## 2022-07-30 DIAGNOSIS — E1122 Type 2 diabetes mellitus with diabetic chronic kidney disease: Secondary | ICD-10-CM | POA: Diagnosis not present

## 2022-07-30 DIAGNOSIS — Z941 Heart transplant status: Secondary | ICD-10-CM | POA: Diagnosis not present

## 2022-07-30 DIAGNOSIS — N183 Chronic kidney disease, stage 3 unspecified: Secondary | ICD-10-CM | POA: Diagnosis not present

## 2022-07-30 DIAGNOSIS — I129 Hypertensive chronic kidney disease with stage 1 through stage 4 chronic kidney disease, or unspecified chronic kidney disease: Secondary | ICD-10-CM | POA: Diagnosis not present

## 2022-07-30 DIAGNOSIS — Z2989 Encounter for other specified prophylactic measures: Secondary | ICD-10-CM | POA: Diagnosis not present

## 2022-09-02 DIAGNOSIS — I081 Rheumatic disorders of both mitral and tricuspid valves: Secondary | ICD-10-CM | POA: Diagnosis not present

## 2022-09-02 DIAGNOSIS — N183 Chronic kidney disease, stage 3 unspecified: Secondary | ICD-10-CM | POA: Diagnosis not present

## 2022-09-02 DIAGNOSIS — Z48298 Encounter for aftercare following other organ transplant: Secondary | ICD-10-CM | POA: Diagnosis not present

## 2022-09-02 DIAGNOSIS — Z2989 Encounter for other specified prophylactic measures: Secondary | ICD-10-CM | POA: Diagnosis not present

## 2022-09-02 DIAGNOSIS — Z79899 Other long term (current) drug therapy: Secondary | ICD-10-CM | POA: Diagnosis not present

## 2022-09-02 DIAGNOSIS — T86298 Other complications of heart transplant: Secondary | ICD-10-CM | POA: Diagnosis not present

## 2022-09-02 DIAGNOSIS — Z941 Heart transplant status: Secondary | ICD-10-CM | POA: Diagnosis not present

## 2022-09-02 DIAGNOSIS — Y831 Surgical operation with implant of artificial internal device as the cause of abnormal reaction of the patient, or of later complication, without mention of misadventure at the time of the procedure: Secondary | ICD-10-CM | POA: Diagnosis not present

## 2022-09-02 DIAGNOSIS — E1122 Type 2 diabetes mellitus with diabetic chronic kidney disease: Secondary | ICD-10-CM | POA: Diagnosis not present

## 2022-09-02 DIAGNOSIS — I129 Hypertensive chronic kidney disease with stage 1 through stage 4 chronic kidney disease, or unspecified chronic kidney disease: Secondary | ICD-10-CM | POA: Diagnosis not present

## 2022-09-25 ENCOUNTER — Encounter (HOSPITAL_COMMUNITY): Payer: Self-pay

## 2022-09-25 ENCOUNTER — Encounter (HOSPITAL_COMMUNITY)
Admission: RE | Admit: 2022-09-25 | Discharge: 2022-09-25 | Disposition: A | Discharge status: Home / Self Care | Payer: BC Managed Care – PPO | Source: Ambulatory Visit | Attending: Internal Medicine | Admitting: Internal Medicine

## 2022-09-25 ENCOUNTER — Other Ambulatory Visit (HOSPITAL_COMMUNITY): Payer: Self-pay

## 2022-09-25 VITALS — BP 120/80 | HR 75 | Ht 70.0 in | Wt 199.5 lb

## 2022-09-25 DIAGNOSIS — Z941 Heart transplant status: Secondary | ICD-10-CM | POA: Diagnosis not present

## 2022-09-25 LAB — GLUCOSE, CAPILLARY: Glucose-Capillary: 110 mg/dL — ABNORMAL HIGH (ref 70–99)

## 2022-09-25 NOTE — Progress Notes (Signed)
Cardiac Individual Treatment Plan  Patient Details  Name: [name] [name] MRN: [id number] Date of Birth: 11/15/1980 Referring Provider:   Flowsheet Row CARDIAC REHAB PHASE II ORIENTATION from 09/25/2022 in [place]  Referring Provider Dr. Haroldine Laws       Initial Encounter Date:  Flowsheet Row CARDIAC REHAB PHASE II ORIENTATION from 09/25/2022 in [place]  Date 09/25/22       Visit Diagnosis: Status post heart transplant [facility])  Patient's Home Medications on Admission:  Current Outpatient Medications:    acetaminophen (TYLENOL) 325 MG tablet, Take 325 mg by mouth in the morning and at bedtime., Disp: , Rfl:    allopurinol (ZYLOPRIM) 300 MG tablet, Take 300 mg by mouth daily. (Patient not taking: Reported on 09/25/2022), Disp: , Rfl:    amiodarone (PACERONE) 200 MG tablet, Take 1 tablet (200 mg total) by mouth daily. (Patient not taking: Reported on 09/25/2022), Disp: 30 tablet, Rfl: 11   aspirin 81 MG chewable tablet, Chew 81 mg by mouth daily., Disp: , Rfl:    Calcium Carb-Cholecalciferol 600-10 MG-MCG TABS, Take 1 tablet by mouth in the morning and at bedtime., Disp: , Rfl:    carvedilol (COREG) 3.125 MG tablet, Take 3.125 mg by mouth in the morning and at bedtime., Disp: , Rfl:    furosemide (LASIX) 10 MG/ML injection, Inject 6 mLs (60 mg total) into the vein 2 (two) times daily. (Patient not taking: Reported on 09/25/2022), Disp: 4 mL, Rfl: 0   gabapentin (NEURONTIN) 100 MG capsule, Take 100 mg by mouth daily., Disp: , Rfl:    heparin 25000 UT/250ML infusion, Inject 1,600 Units/hr into the vein continuous. (Patient not taking: Reported on 09/25/2022), Disp: , Rfl:    hydrALAZINE (APRESOLINE) 10 MG tablet, Take 1 tablet (10 mg total) by mouth every 8 (eight) hours. (Patient not taking: Reported on 09/25/2022), Disp: , Rfl:    isosorbide mononitrate (IMDUR) 30 MG 24 hr tablet, Take 1 tablet (30 mg total) by mouth daily. (Patient not  taking: Reported on 09/25/2022), Disp: , Rfl:    magnesium oxide (MAG-OX) 400 (240 Mg) MG tablet, Take 2 tablets by mouth 2 (two) times daily., Disp: , Rfl:    metFORMIN (GLUCOPHAGE-XR) 500 MG 24 hr tablet, Take 500 mg by mouth 2 (two) times daily., Disp: , Rfl:    metoCLOPramide (REGLAN) 5 MG/ML injection, Inject 2 mLs (10 mg total) into the vein every 6 (six) hours. (Patient not taking: Reported on 09/25/2022), Disp: 2 mL, Rfl: 0   milrinone (PRIMACOR) 20 MG/100 ML SOLN infusion, Inject 0.0403 mg/min into the vein continuous. (Patient not taking: Reported on 09/25/2022), Disp: , Rfl:    mycophenolate (CELLCEPT) 500 MG tablet, Take 1,000 mg by mouth 2 (two) times daily., Disp: , Rfl:    nystatin (MYCOSTATIN) 100000 UNIT/ML suspension, Take 5 mLs by mouth 4 (four) times daily., Disp: , Rfl:    ondansetron (ZOFRAN) 4 MG/2ML SOLN injection, Inject 2 mLs (4 mg total) into the vein every 6 (six) hours as needed for nausea. (Patient not taking: Reported on 09/25/2022), Disp: 2 mL, Rfl: 0   oxyCODONE (OXY IR/ROXICODONE) 5 MG immediate release tablet, Take 5 mg by mouth every 6 (six) hours as needed for breakthrough pain., Disp: , Rfl:    pantoprazole (PROTONIX) 40 MG tablet, Take 40 mg by mouth daily., Disp: , Rfl:    pravastatin (PRAVACHOL) 40 MG tablet, Take 40 mg by mouth daily., Disp: , Rfl:    predniSONE (DELTASONE) 5  MG tablet, Take 7.5 mg by mouth in the morning and at bedtime., Disp: , Rfl:    promethazine (PHENERGAN) 25 MG tablet, Take 1 tablet (25 mg total) by mouth every 6 (six) hours as needed for nausea or vomiting. (Patient not taking: Reported on 09/25/2022), Disp: 30 tablet, Rfl: 0   SENEXON-S 8.6-50 MG tablet, Take 2 tablets by mouth 2 (two) times daily., Disp: , Rfl:    sertraline (ZOLOFT) 50 MG tablet, Take 1 tablet (50 mg total) by mouth daily. (Patient not taking: Reported on 09/25/2022), Disp: 90 tablet, Rfl: 3   spironolactone (ALDACTONE) 25 MG tablet, Take 1 tablet (25 mg total) by mouth  daily. (Patient not taking: Reported on 09/25/2022), Disp: , Rfl:    sulfamethoxazole-trimethoprim (BACTRIM DS) 800-160 MG tablet, Take 1 tablet by mouth daily., Disp: , Rfl:    tacrolimus (PROGRAF) 1 MG capsule, Take 4 mg by mouth 2 (two) times daily., Disp: , Rfl:    temazepam (RESTORIL) 30 MG capsule, Take 1 capsule (30 mg total) by mouth at bedtime as needed for sleep. (Patient not taking: Reported on 09/25/2022), Disp: 30 capsule, Rfl: 0   torsemide (DEMADEX) 20 MG tablet, Take 20 mg by mouth 2 (two) times daily., Disp: , Rfl:    traZODone (DESYREL) 100 MG tablet, Take 100 mg by mouth at bedtime., Disp: , Rfl:    valGANciclovir (VALCYTE) 450 MG tablet, Take 450 mg by mouth daily., Disp: , Rfl:    VITAMIN D3 1.25 MG (50000 UT) capsule, Take 50,000 Units by mouth once a week., Disp: , Rfl:   Past Medical History: Past Medical History:  Diagnosis Date   Anxiety    CHF (congestive heart failure) (HCC)    Chronic systolic heart failure (HCC)    CKD (chronic kidney disease) stage 2, GFR 60-89 ml/min    Dyspnea    with value issues- "if I dont take my medication"   Essential hypertension, benign    Headache    History of pneumonia    Mitral regurgitation    Moderate   Noncompliance    Nonischemic cardiomyopathy ([place])    Normal coronaries May 2012, LVEF < 20%   Pneumonia 2011   S/P aortic valve repair 05/11/2017   Complex valvuloplasty including plication of left coronary leaflet and 2mm Biostable HAART ring annuloplasty    Tobacco Use: Social History   Tobacco Use  Smoking Status Never  Smokeless Tobacco Never    Labs: Review Flowsheet  More data exists      Latest Ref Rng & Units 03/23/2022 04/03/2022 04/04/2022 04/05/2022 04/06/2022  Labs for ITP Cardiac and Pulmonary Rehab  Bicarbonate 20.0 - 28.0 mmol/L - 23.7  24.9  - 25.9  -  TCO2 22 - 32 mmol/L 22 - 32 mmol/L - 25  26  - - -  O2 Saturation % 63.2  55  54  60.6  65.2  58.5  63.6  60.8     Capillary Blood Glucose: Lab  Results  Component Value Date   GLUCAP 110 (H) 09/25/2022   GLUCAP 143 (H) 04/05/2022   GLUCAP 196 (H) 03/23/2022   GLUCAP 115 (H) 03/23/2022   GLUCAP 181 (H) 03/22/2022     Exercise Target Goals: Exercise Program Goal: Individual exercise prescription set using results from initial 6 min walk test and THRR while considering  patient's activity barriers and safety.   Exercise Prescription Goal: Starting with aerobic activity 30 plus minutes a day, 3 days per week for initial exercise  prescription. Provide home exercise prescription and guidelines that participant acknowledges understanding prior to discharge.  Activity Barriers & Risk Stratification:  Activity Barriers & Cardiac Risk Stratification - 09/25/22 1250       Activity Barriers & Cardiac Risk Stratification   Activity Barriers Shortness of Breath;Incisional Pain    Cardiac Risk Stratification High             6 Minute Walk:  6 Minute Walk     Row Name 09/25/22 1425         6 Minute Walk   Phase Initial     Distance 1300 feet     Walk Time 6 minutes     # of Rest Breaks 0     MPH 2.46     METS 4.37     RPE 12     VO2 Peak 15.3     Symptoms No     Resting HR 75 bpm     Resting BP 120/80     Resting Oxygen Saturation  98 %     Exercise Oxygen Saturation  during 6 min walk 98 %     Max Ex. HR 85 bpm     Max Ex. BP 140/80     2 Minute Post BP 122/80              Oxygen Initial Assessment:   Oxygen Re-Evaluation:   Oxygen Discharge (Final Oxygen Re-Evaluation):   Initial Exercise Prescription:  Initial Exercise Prescription - 09/25/22 1400       Date of Initial Exercise RX and Referring Provider   Date 09/25/22    Referring Provider Dr. Haroldine Laws    Expected Discharge Date 12/16/22      Treadmill   MPH 1    Grade 0    Minutes 17      NuStep   Level 1    SPM 80    Minutes 22      Prescription Details   Frequency (times per week) 3    Duration Progress to 30 minutes of  continuous aerobic without signs/symptoms of physical distress      Intensity   THRR 40-80% of Max Heartrate 72-143    Ratings of Perceived Exertion 11-13      Resistance Training   Training Prescription Yes    Weight 4    Reps 10-15             Perform Capillary Blood Glucose checks as needed.  Exercise Prescription Changes:   Exercise Comments:   Exercise Goals and Review:   Exercise Goals     Row Name 09/25/22 1428             Exercise Goals   Increase Physical Activity Yes       Intervention Provide advice, education, support and counseling about physical activity/exercise needs.;Develop an individualized exercise prescription for aerobic and resistive training based on initial evaluation findings, risk stratification, comorbidities and participant's personal goals.       Expected Outcomes Short Term: Attend rehab on a regular basis to increase amount of physical activity.;Long Term: Add in home exercise to make exercise part of routine and to increase amount of physical activity.;Long Term: Exercising regularly at least 3-5 days a week.       Increase Strength and Stamina Yes       Intervention Provide advice, education, support and counseling about physical activity/exercise needs.;Develop an individualized exercise prescription for aerobic and resistive training based on initial evaluation findings, risk  stratification, comorbidities and participant's personal goals.       Expected Outcomes Short Term: Increase workloads from initial exercise prescription for resistance, speed, and METs.;Short Term: Perform resistance training exercises routinely during rehab and add in resistance training at home;Long Term: Improve cardiorespiratory fitness, muscular endurance and strength as measured by increased METs and functional capacity (6MWT)       Able to understand and use rate of perceived exertion (RPE) scale Yes       Intervention Provide education and explanation on how  to use RPE scale       Expected Outcomes Short Term: Able to use RPE daily in rehab to express subjective intensity level;Long Term:  Able to use RPE to guide intensity level when exercising independently       Knowledge and understanding of Target Heart Rate Range (THRR) Yes       Intervention Provide education and explanation of THRR including how the numbers were predicted and where they are located for reference       Expected Outcomes Short Term: Able to state/look up THRR;Short Term: Able to use daily as guideline for intensity in rehab;Long Term: Able to use THRR to govern intensity when exercising independently       Able to check pulse independently Yes       Intervention Provide education and demonstration on how to check pulse in carotid and radial arteries.;Review the importance of being able to check your own pulse for safety during independent exercise       Expected Outcomes Short Term: Able to explain why pulse checking is important during independent exercise;Long Term: Able to check pulse independently and accurately       Understanding of Exercise Prescription Yes       Intervention Provide education, explanation, and written materials on patient's individual exercise prescription       Expected Outcomes Short Term: Able to explain program exercise prescription;Long Term: Able to explain home exercise prescription to exercise independently                Exercise Goals Re-Evaluation :    Discharge Exercise Prescription (Final Exercise Prescription Changes):   Nutrition:  Target Goals: Understanding of nutrition guidelines, daily intake of sodium 1500mg , cholesterol 200mg , calories 30% from fat and 7% or less from saturated fats, daily to have 5 or more servings of fruits and vegetables.  Biometrics:  Pre Biometrics - 09/25/22 1429       Pre Biometrics   Height 5\' 10"  (1.778 m)    Weight 90.5 kg    Waist Circumference 39 inches    Hip Circumference 41 inches     Waist to Hip Ratio 0.95 %    BMI (Calculated) 28.63    Triceps Skinfold 10 mm    % Body Fat 24.7 %    Grip Strength 42.4 kg    Flexibility 0 in    Single Leg Stand 10.65 seconds              Nutrition Therapy Plan and Nutrition Goals:  Nutrition Therapy & Goals - 09/25/22 1403       Personal Nutrition Goals   Comments Patient scored 148 on his diet assessment. He admits to not eating healthy and knows he needs to improve. We have educational sessions on heart healthy nutrition with handouts and we offer assistance with RD referral if patient is interested.      Intervention Plan   Intervention Nutrition handout(s) given to patient.  Expected Outcomes Short Term Goal: Understand basic principles of dietary content, such as calories, fat, sodium, cholesterol and nutrients.             Nutrition Assessments:  Nutrition Assessments - 09/25/22 1402       MEDFICTS Scores   Pre Score 148            MEDIFICTS Score Key: ?70 Need to make dietary changes  40-70 Heart Healthy Diet ? 40 Therapeutic Level Cholesterol Diet   Picture Your Plate Scores: D34-534 Unhealthy dietary pattern with much room for improvement. 41-50 Dietary pattern unlikely to meet recommendations for good health and room for improvement. 51-60 More healthful dietary pattern, with some room for improvement.  >60 Healthy dietary pattern, although there may be some specific behaviors that could be improved.    Nutrition Goals Re-Evaluation:   Nutrition Goals Discharge (Final Nutrition Goals Re-Evaluation):   Psychosocial: Target Goals: Acknowledge presence or absence of significant depression and/or stress, maximize coping skills, provide positive support system. Participant is able to verbalize types and ability to use techniques and skills needed for reducing stress and depression.  Initial Review & Psychosocial Screening:  Initial Psych Review & Screening - 09/25/22 1416       Initial  Review   Current issues with Current Sleep Concerns;Current Psychotropic Meds      Family Dynamics   Good Support System? Yes      Barriers   Psychosocial barriers to participate in program There are no identifiable barriers or psychosocial needs.      Screening Interventions   Interventions Encouraged to exercise;To provide support and resources with identified psychosocial needs;Provide feedback about the scores to participant    Expected Outcomes Short Term goal: Utilizing psychosocial counselor, staff and physician to assist with identification of specific Stressors or current issues interfering with healing process. Setting desired goal for each stressor or current issue identified.;Short Term goal: Identification and review with participant of any Quality of Life or Depression concerns found by scoring the questionnaire.             Quality of Life Scores:  Quality of Life - 09/25/22 1431       Quality of Life   Select Quality of Life      Quality of Life Scores   Health/Function Pre 21.2 %    Socioeconomic Pre 8.69 %    Psych/Spiritual Pre 24 %    Family Pre 25.2 %    GLOBAL Pre 19.47 %            Scores of 19 and below usually indicate a poorer quality of life in these areas.  A difference of  2-3 points is a clinically meaningful difference.  A difference of 2-3 points in the total score of the Quality of Life Index has been associated with significant improvement in overall quality of life, self-image, physical symptoms, and general health in studies assessing change in quality of life.  PHQ-9: Review Flowsheet       09/25/2022 07/23/2017  Depression screen PHQ 2/9  Decreased Interest 0 0  Down, Depressed, Hopeless 1 1  PHQ - 2 Score 1 1  Altered sleeping 2 3  Tired, decreased energy 1 1  Change in appetite 1 1  Feeling bad or failure about yourself  1 1  Trouble concentrating 0 0  Moving slowly or fidgety/restless 0 0  Suicidal thoughts 0 0  PHQ-9  Score 6 7  Difficult doing work/chores Not difficult at all  Not difficult at all   Interpretation of Total Score  Total Score Depression Severity:  1-4 = Minimal depression, 5-9 = Mild depression, 10-14 = Moderate depression, 15-19 = Moderately severe depression, 20-27 = Severe depression   Psychosocial Evaluation and Intervention:  Psychosocial Evaluation - 09/25/22 1447       Psychosocial Evaluation & Interventions   Interventions Encouraged to exercise with the program and follow exercise prescription;Stress management education;Relaxation education    Comments Patient has no psychosocial barriers identified at his orientation visit. His PHQ-9 score was 6 due to trouble sleeping; low energy; overeating; and he does admit to feeling down at times when he looks in the mirror and sees his scars and remembers all he has been through. 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 He uses melatonin and trazodone for sleep with some improvement. He denies any depression or anxiety. He has good support from his wife and his mother and his wife's parents. He has twin boys 69 months old and they keep him busy. He is still on long term disability with good year but is working on getting permanent disability. He has participated in the program in 2018 with a valve replacement. He is ready to start the program hoping to get healthier overall and be able to do his normal activities and finish his degree.    Expected Outcomes Patient will continue to have no psychosocial barriers identified.             Psychosocial Re-Evaluation:   Psychosocial Discharge (Final Psychosocial Re-Evaluation):   Vocational Rehabilitation: Provide vocational rehab assistance to qualifying candidates.   Vocational Rehab Evaluation & Intervention:  Vocational Rehab - 09/25/22 1405       Initial Vocational Rehab Evaluation & Intervention   Assessment shows  need for Vocational Rehabilitation No      Vocational Rehab Re-Evaulation   Comments Patient is out of work on long term disability and is working on getting permanent disability.             Education: Education Goals: Education classes will be provided on a weekly basis, covering required topics. Participant will state understanding/return demonstration of topics presented.  Learning Barriers/Preferences:  Learning Barriers/Preferences - 09/25/22 1404       Learning Barriers/Preferences   Learning Barriers None    Learning Preferences Skilled Demonstration;Audio             Education Topics: Hypertension, Hypertension Reduction -Define heart disease and high blood pressure. Discus how high blood pressure affects the body and ways to reduce high blood pressure.   Exercise and Your Heart -Discuss why it is important to exercise, the FITT principles of exercise, normal and abnormal responses to exercise, and how to exercise safely. Flowsheet Row CARDIAC REHAB PHASE II EXERCISE from 09/08/2017 in [place]  Date 08/25/17  Educator Sac  Instruction Review Code 2- Demonstrated Understanding       Angina -Discuss definition of angina, causes of angina, treatment of angina, and how to decrease risk of having angina. Flowsheet Row CARDIAC REHAB PHASE II EXERCISE from 09/08/2017 in [place]  Date 09/01/17  Educator DJ  Instruction Review Code 2- Demonstrated Understanding       Cardiac Medications -Review what the following cardiac medications are used for, how they affect the body, and side effects that may occur when taking the medications.  Medications include Aspirin, Beta blockers,  calcium channel blockers, ACE Inhibitors, angiotensin receptor blockers, diuretics, digoxin, and antihyperlipidemics. Flowsheet Row CARDIAC REHAB PHASE II EXERCISE from 09/08/2017 in [place]  Date 09/08/17  Educator  DJ  Instruction Review Code 2- Demonstrated Understanding       Congestive Heart Failure -Discuss the definition of CHF, how to live with CHF, the signs and symptoms of CHF, and how keep track of weight and sodium intake.   Heart Disease and Intimacy -Discus the effect sexual activity has on the heart, how changes occur during intimacy as we age, and safety during sexual activity.   Smoking Cessation / COPD -Discuss different methods to quit smoking, the health benefits of quitting smoking, and the definition of COPD.   Nutrition I: Fats -Discuss the types of cholesterol, what cholesterol does to the heart, and how cholesterol levels can be controlled.   Nutrition II: Labels -Discuss the different components of food labels and how to read food label   Heart Parts/Heart Disease and PAD -Discuss the anatomy of the heart, the pathway of blood circulation through the heart, and these are affected by heart disease.   Stress I: Signs and Symptoms -Discuss the causes of stress, how stress may lead to anxiety and depression, and ways to limit stress. Flowsheet Row CARDIAC REHAB PHASE II EXERCISE from 09/08/2017 in [place]  Date 07/28/17  Educator DJ  Instruction Review Code 2- Demonstrated Understanding       Stress II: Relaxation -Discuss different types of relaxation techniques to limit stress. Flowsheet Row CARDIAC REHAB PHASE II EXERCISE from 09/08/2017 in [place]  Date 08/04/17  Educator DC  Instruction Review Code 2- Demonstrated Understanding       Warning Signs of Stroke / TIA -Discuss definition of a stroke, what the signs and symptoms are of a stroke, and how to identify when someone is having stroke. Flowsheet Row CARDIAC REHAB PHASE II EXERCISE from 09/08/2017 in [place]  Date 08/11/17  Educator DC  Instruction Review Code 2- Demonstrated Understanding       Knowledge  Questionnaire Score:  Knowledge Questionnaire Score - 09/25/22 1405       Knowledge Questionnaire Score   Pre Score 20/24             Core Components/Risk Factors/Patient Goals at Admission:  Personal Goals and Risk Factors at Admission - 09/25/22 1413       Core Components/Risk Factors/Patient Goals on Admission    Weight Management Obesity    Improve shortness of breath with ADL's Yes    Intervention Provide education, individualized exercise plan and daily activity instruction to help decrease symptoms of SOB with activities of daily living.    Expected Outcomes Short Term: Improve cardiorespiratory fitness to achieve a reduction of symptoms when performing ADLs;Long Term: Be able to perform more ADLs without symptoms or delay the onset of symptoms    Hypertension Yes    Intervention Provide education on lifestyle modifcations including regular physical activity/exercise, weight management, moderate sodium restriction and increased consumption of fresh fruit, vegetables, and low fat dairy, alcohol moderation, and smoking cessation.;Monitor prescription use compliance.    Expected Outcomes Short Term: Continued assessment and intervention until BP is < 140/76mm HG in hypertensive participants. < 130/26mm HG in hypertensive participants with diabetes, heart failure or chronic kidney disease.;Long Term: Maintenance of blood pressure at goal levels.    Personal Goal Other Yes    Personal Goal Patient wants to get healthier  overall; be able to return to his normal activities; and be able to go back to college and finish his degress in sports medicine.    Intervention Patient will attend CR 3 days/week with exercise and education.    Expected Outcomes Patient will complete the program meeting both personal and program goals.             Core Components/Risk Factors/Patient Goals Review:    Core Components/Risk Factors/Patient Goals at Discharge (Final Review):    ITP  Comments:   Comments: Patient arrived for 1st visit/orientation/education at 1230. Patient was referred to CR by Dr. Haroldine Laws due to Status Post Heart Transplant (Z94.1). During orientation advised patient on arrival and appointment times what to wear, what to do before, during and after exercise. Reviewed attendance and class policy.  Pt is scheduled to return Cardiac Rehab on 09/28/22 at 1445. Pt was advised to come to class 15 minutes before class starts.  Discussed RPE/Dpysnea scales. Patient participated in warm up stretches. Patient was able to complete 6 minute walk test.  Telemetry:NSR. Patient was measured for the equipment. Discussed equipment safety with patient. Took patient pre-anthropometric measurements. Patient finished visit at 1400.

## 2022-09-28 ENCOUNTER — Encounter (HOSPITAL_COMMUNITY)
Admission: RE | Admit: 2022-09-28 | Discharge: 2022-09-28 | Disposition: A | Discharge status: Home / Self Care | Payer: BC Managed Care – PPO | Source: Ambulatory Visit | Attending: Internal Medicine | Admitting: Internal Medicine

## 2022-09-28 DIAGNOSIS — Z941 Heart transplant status: Secondary | ICD-10-CM | POA: Diagnosis not present

## 2022-09-28 NOTE — Progress Notes (Signed)
Daily Session Note  Patient Details  Name: [name] [name] MRN: [id number] Date of Birth: [birth year] Referring Provider:   Flowsheet Row CARDIAC REHAB PHASE II ORIENTATION from 09/25/2022 in [place]  Referring Provider Dr. Haroldine Laws       Encounter Date: 09/28/2022  Check In:  Session Check In - 09/28/22 1445       Check-In   Supervising physician immediately available to respond to emergencies [facility] MD immediately available    Physician(s) Dr. Dellia Cloud    Location [facility]    Staff Present Leana Roe, BS, Exercise Physiologist;Karlea Mckibbin Hassell Done, RN, BSN    Virtual Visit No    Medication changes reported     No    Fall or balance concerns reported    No    Tobacco Cessation No Change    Warm-up and Cool-down Performed as group-led instruction    Resistance Training Performed No    VAD Patient? No    PAD/SET Patient? No      Pain Assessment   Currently in Pain? No/denies    Pain Score 0-No pain    Multiple Pain Sites No             Capillary Blood Glucose: No results found for this or any previous visit (from the past 24 hour(s)).    Social History   Tobacco Use  Smoking Status Never  Smokeless Tobacco Never    Goals Met:  Independence with exercise equipment Exercise tolerated well No report of concerns or symptoms today Strength training completed today  Goals Unmet:  Not Applicable  Comments: Checkout at 1545.   Dr. Carlyle Dolly is Medical Director for [facility]

## 2022-09-30 ENCOUNTER — Encounter (HOSPITAL_COMMUNITY): Payer: BC Managed Care – PPO

## 2022-10-02 ENCOUNTER — Encounter (HOSPITAL_COMMUNITY): Payer: BC Managed Care – PPO

## 2022-10-05 ENCOUNTER — Encounter (HOSPITAL_COMMUNITY)
Admission: RE | Admit: 2022-10-05 | Discharge: 2022-10-05 | Disposition: A | Discharge status: Home / Self Care | Payer: BC Managed Care – PPO | Source: Ambulatory Visit | Attending: Internal Medicine | Admitting: Internal Medicine

## 2022-10-05 VITALS — Wt 204.8 lb

## 2022-10-05 DIAGNOSIS — Z941 Heart transplant status: Secondary | ICD-10-CM | POA: Diagnosis not present

## 2022-10-05 NOTE — Progress Notes (Signed)
Daily Session Note  Patient Details  Name: KARDEN SCHNEEKLOTH MRN: [id number] Date of Birth: 1981-02-03 Referring Provider:   Flowsheet Row CARDIAC REHAB PHASE II ORIENTATION from 09/25/2022 in [place]  Referring Provider Dr. Haroldine Laws       Encounter Date: 10/05/2022  Check In:  Session Check In - 10/05/22 1435       Check-In   Supervising physician immediately available to respond to emergencies [facility] MD immediately available    Physician(s) Dr Domenic Polite    Location [facility]    Staff Present Leana Roe, BS, Exercise Physiologist;Rigoberto Repass Hassell Done, RN, BSN    Virtual Visit No    Medication changes reported     No    Fall or balance concerns reported    No    Tobacco Cessation No Change    Warm-up and Cool-down Performed as group-led instruction    Resistance Training Performed Yes    VAD Patient? No    PAD/SET Patient? No      Pain Assessment   Currently in Pain? No/denies    Pain Score 0-No pain    Multiple Pain Sites No             Capillary Blood Glucose: No results found for this or any previous visit (from the past 24 hour(s)).    Social History   Tobacco Use  Smoking Status Never  Smokeless Tobacco Never    Goals Met:  Independence with exercise equipment Exercise tolerated well No report of concerns or symptoms today Strength training completed today  Goals Unmet:  Not Applicable  Comments: Checkout at 1545.   Dr. Carlyle Dolly is Medical Director for [facility]

## 2022-10-06 DIAGNOSIS — Z79899 Other long term (current) drug therapy: Secondary | ICD-10-CM | POA: Diagnosis not present

## 2022-10-06 DIAGNOSIS — I129 Hypertensive chronic kidney disease with stage 1 through stage 4 chronic kidney disease, or unspecified chronic kidney disease: Secondary | ICD-10-CM | POA: Diagnosis not present

## 2022-10-06 DIAGNOSIS — Z2989 Encounter for other specified prophylactic measures: Secondary | ICD-10-CM | POA: Diagnosis not present

## 2022-10-06 DIAGNOSIS — T8621 Heart transplant rejection: Secondary | ICD-10-CM | POA: Diagnosis not present

## 2022-10-06 DIAGNOSIS — N1832 Chronic kidney disease, stage 3b: Secondary | ICD-10-CM | POA: Diagnosis not present

## 2022-10-06 DIAGNOSIS — Z48298 Encounter for aftercare following other organ transplant: Secondary | ICD-10-CM | POA: Diagnosis not present

## 2022-10-06 DIAGNOSIS — E1122 Type 2 diabetes mellitus with diabetic chronic kidney disease: Secondary | ICD-10-CM | POA: Diagnosis not present

## 2022-10-06 DIAGNOSIS — Z941 Heart transplant status: Secondary | ICD-10-CM | POA: Diagnosis not present

## 2022-10-06 DIAGNOSIS — I088 Other rheumatic multiple valve diseases: Secondary | ICD-10-CM | POA: Diagnosis not present

## 2022-10-06 DIAGNOSIS — Z79624 Long term (current) use of inhibitors of nucleotide synthesis: Secondary | ICD-10-CM | POA: Diagnosis not present

## 2022-10-06 DIAGNOSIS — Z79621 Long term (current) use of calcineurin inhibitor: Secondary | ICD-10-CM | POA: Diagnosis not present

## 2022-10-06 DIAGNOSIS — Y830 Surgical operation with transplant of whole organ as the cause of abnormal reaction of the patient, or of later complication, without mention of misadventure at the time of the procedure: Secondary | ICD-10-CM | POA: Diagnosis not present

## 2022-10-06 DIAGNOSIS — Z4821 Encounter for aftercare following heart transplant: Secondary | ICD-10-CM | POA: Diagnosis not present

## 2022-10-07 ENCOUNTER — Encounter (HOSPITAL_COMMUNITY)
Admission: RE | Admit: 2022-10-07 | Discharge: 2022-10-07 | Disposition: A | Discharge status: Home / Self Care | Payer: BC Managed Care – PPO | Source: Ambulatory Visit | Attending: Internal Medicine | Admitting: Internal Medicine

## 2022-10-07 DIAGNOSIS — Z941 Heart transplant status: Secondary | ICD-10-CM

## 2022-10-07 NOTE — Progress Notes (Signed)
Cardiac Individual Treatment Plan  Patient Details  Name: [name] [name] MRN: [id number] Date of Birth: 11/15/1980 Referring Provider:   Flowsheet Row CARDIAC REHAB PHASE II ORIENTATION from 09/25/2022 in [place]  Referring Provider Dr. Haroldine Laws       Initial Encounter Date:  Flowsheet Row CARDIAC REHAB PHASE II ORIENTATION from 09/25/2022 in [place]  Date 09/25/22       Visit Diagnosis: Status post heart transplant [facility])  Patient's Home Medications on Admission:  Current Outpatient Medications:    acetaminophen (TYLENOL) 325 MG tablet, Take 325 mg by mouth in the morning and at bedtime., Disp: , Rfl:    allopurinol (ZYLOPRIM) 300 MG tablet, Take 300 mg by mouth daily. (Patient not taking: Reported on 09/25/2022), Disp: , Rfl:    amiodarone (PACERONE) 200 MG tablet, Take 1 tablet (200 mg total) by mouth daily. (Patient not taking: Reported on 09/25/2022), Disp: 30 tablet, Rfl: 11   aspirin 81 MG chewable tablet, Chew 81 mg by mouth daily., Disp: , Rfl:    Calcium Carb-Cholecalciferol 600-10 MG-MCG TABS, Take 1 tablet by mouth in the morning and at bedtime., Disp: , Rfl:    carvedilol (COREG) 3.125 MG tablet, Take 3.125 mg by mouth in the morning and at bedtime., Disp: , Rfl:    furosemide (LASIX) 10 MG/ML injection, Inject 6 mLs (60 mg total) into the vein 2 (two) times daily. (Patient not taking: Reported on 09/25/2022), Disp: 4 mL, Rfl: 0   gabapentin (NEURONTIN) 100 MG capsule, Take 100 mg by mouth daily., Disp: , Rfl:    heparin 25000 UT/250ML infusion, Inject 1,600 Units/hr into the vein continuous. (Patient not taking: Reported on 09/25/2022), Disp: , Rfl:    hydrALAZINE (APRESOLINE) 10 MG tablet, Take 1 tablet (10 mg total) by mouth every 8 (eight) hours. (Patient not taking: Reported on 09/25/2022), Disp: , Rfl:    isosorbide mononitrate (IMDUR) 30 MG 24 hr tablet, Take 1 tablet (30 mg total) by mouth daily. (Patient not  taking: Reported on 09/25/2022), Disp: , Rfl:    magnesium oxide (MAG-OX) 400 (240 Mg) MG tablet, Take 2 tablets by mouth 2 (two) times daily., Disp: , Rfl:    metFORMIN (GLUCOPHAGE-XR) 500 MG 24 hr tablet, Take 500 mg by mouth 2 (two) times daily., Disp: , Rfl:    metoCLOPramide (REGLAN) 5 MG/ML injection, Inject 2 mLs (10 mg total) into the vein every 6 (six) hours. (Patient not taking: Reported on 09/25/2022), Disp: 2 mL, Rfl: 0   milrinone (PRIMACOR) 20 MG/100 ML SOLN infusion, Inject 0.0403 mg/min into the vein continuous. (Patient not taking: Reported on 09/25/2022), Disp: , Rfl:    mycophenolate (CELLCEPT) 500 MG tablet, Take 1,000 mg by mouth 2 (two) times daily., Disp: , Rfl:    nystatin (MYCOSTATIN) 100000 UNIT/ML suspension, Take 5 mLs by mouth 4 (four) times daily., Disp: , Rfl:    ondansetron (ZOFRAN) 4 MG/2ML SOLN injection, Inject 2 mLs (4 mg total) into the vein every 6 (six) hours as needed for nausea. (Patient not taking: Reported on 09/25/2022), Disp: 2 mL, Rfl: 0   oxyCODONE (OXY IR/ROXICODONE) 5 MG immediate release tablet, Take 5 mg by mouth every 6 (six) hours as needed for breakthrough pain., Disp: , Rfl:    pantoprazole (PROTONIX) 40 MG tablet, Take 40 mg by mouth daily., Disp: , Rfl:    pravastatin (PRAVACHOL) 40 MG tablet, Take 40 mg by mouth daily., Disp: , Rfl:    predniSONE (DELTASONE) 5  MG tablet, Take 7.5 mg by mouth in the morning and at bedtime., Disp: , Rfl:    promethazine (PHENERGAN) 25 MG tablet, Take 1 tablet (25 mg total) by mouth every 6 (six) hours as needed for nausea or vomiting. (Patient not taking: Reported on 09/25/2022), Disp: 30 tablet, Rfl: 0   SENEXON-S 8.6-50 MG tablet, Take 2 tablets by mouth 2 (two) times daily., Disp: , Rfl:    sertraline (ZOLOFT) 50 MG tablet, Take 1 tablet (50 mg total) by mouth daily. (Patient not taking: Reported on 09/25/2022), Disp: 90 tablet, Rfl: 3   spironolactone (ALDACTONE) 25 MG tablet, Take 1 tablet (25 mg total) by mouth  daily. (Patient not taking: Reported on 09/25/2022), Disp: , Rfl:    sulfamethoxazole-trimethoprim (BACTRIM DS) 800-160 MG tablet, Take 1 tablet by mouth daily., Disp: , Rfl:    tacrolimus (PROGRAF) 1 MG capsule, Take 4 mg by mouth 2 (two) times daily., Disp: , Rfl:    temazepam (RESTORIL) 30 MG capsule, Take 1 capsule (30 mg total) by mouth at bedtime as needed for sleep. (Patient not taking: Reported on 09/25/2022), Disp: 30 capsule, Rfl: 0   torsemide (DEMADEX) 20 MG tablet, Take 20 mg by mouth 2 (two) times daily., Disp: , Rfl:    traZODone (DESYREL) 100 MG tablet, Take 100 mg by mouth at bedtime., Disp: , Rfl:    valGANciclovir (VALCYTE) 450 MG tablet, Take 450 mg by mouth daily., Disp: , Rfl:    VITAMIN D3 1.25 MG (50000 UT) capsule, Take 50,000 Units by mouth once a week., Disp: , Rfl:   Past Medical History: Past Medical History:  Diagnosis Date   Anxiety    CHF (congestive heart failure) (HCC)    Chronic systolic heart failure (HCC)    CKD (chronic kidney disease) stage 2, GFR 60-89 ml/min    Dyspnea    with value issues- "if I dont take my medication"   Essential hypertension, benign    Headache    History of pneumonia    Mitral regurgitation    Moderate   Noncompliance    Nonischemic cardiomyopathy ([place])    Normal coronaries May 2012, LVEF < 20%   Pneumonia 2011   S/P aortic valve repair 05/11/2017   Complex valvuloplasty including plication of left coronary leaflet and 2mm Biostable HAART ring annuloplasty    Tobacco Use: Social History   Tobacco Use  Smoking Status Never  Smokeless Tobacco Never    Labs: Review Flowsheet  More data exists      Latest Ref Rng & Units 03/23/2022 04/03/2022 04/04/2022 04/05/2022 04/06/2022  Labs for ITP Cardiac and Pulmonary Rehab  Bicarbonate 20.0 - 28.0 mmol/L - 23.7  24.9  - 25.9  -  TCO2 22 - 32 mmol/L 22 - 32 mmol/L - 25  26  - - -  O2 Saturation % 63.2  55  54  60.6  65.2  58.5  63.6  60.8     Capillary Blood Glucose: Lab  Results  Component Value Date   GLUCAP 110 (H) 09/25/2022   GLUCAP 143 (H) 04/05/2022   GLUCAP 196 (H) 03/23/2022   GLUCAP 115 (H) 03/23/2022   GLUCAP 181 (H) 03/22/2022     Exercise Target Goals: Exercise Program Goal: Individual exercise prescription set using results from initial 6 min walk test and THRR while considering  patient's activity barriers and safety.   Exercise Prescription Goal: Starting with aerobic activity 30 plus minutes a day, 3 days per week for initial exercise  prescription. Provide home exercise prescription and guidelines that participant acknowledges understanding prior to discharge.  Activity Barriers & Risk Stratification:  Activity Barriers & Cardiac Risk Stratification - 09/25/22 1250       Activity Barriers & Cardiac Risk Stratification   Activity Barriers Shortness of Breath;Incisional Pain    Cardiac Risk Stratification High             6 Minute Walk:  6 Minute Walk     Row Name 09/25/22 1425         6 Minute Walk   Phase Initial     Distance 1300 feet     Walk Time 6 minutes     # of Rest Breaks 0     MPH 2.46     METS 4.37     RPE 12     VO2 Peak 15.3     Symptoms No     Resting HR 75 bpm     Resting BP 120/80     Resting Oxygen Saturation  98 %     Exercise Oxygen Saturation  during 6 min walk 98 %     Max Ex. HR 85 bpm     Max Ex. BP 140/80     2 Minute Post BP 122/80              Oxygen Initial Assessment:   Oxygen Re-Evaluation:   Oxygen Discharge (Final Oxygen Re-Evaluation):   Initial Exercise Prescription:  Initial Exercise Prescription - 09/25/22 1400       Date of Initial Exercise RX and Referring Provider   Date 09/25/22    Referring Provider Dr. Haroldine Laws    Expected Discharge Date 12/16/22      Treadmill   MPH 1    Grade 0    Minutes 17      NuStep   Level 1    SPM 80    Minutes 22      Prescription Details   Frequency (times per week) 3    Duration Progress to 30 minutes of  continuous aerobic without signs/symptoms of physical distress      Intensity   THRR 40-80% of Max Heartrate 72-143    Ratings of Perceived Exertion 11-13      Resistance Training   Training Prescription Yes    Weight 4    Reps 10-15             Perform Capillary Blood Glucose checks as needed.  Exercise Prescription Changes:   Exercise Prescription Changes     Row Name 10/05/22 1500             Response to Exercise   Blood Pressure (Admit) 132/74       Blood Pressure (Exercise) 130/70       Blood Pressure (Exit) 138/90       Heart Rate (Admit) 93 bpm       Heart Rate (Exercise) 100 bpm       Heart Rate (Exit) 90 bpm       Rating of Perceived Exertion (Exercise) 11       Duration Continue with 30 min of aerobic exercise without signs/symptoms of physical distress.       Intensity THRR unchanged         Progression   Progression Continue to progress workloads to maintain intensity without signs/symptoms of physical distress.         Resistance Training   Training Prescription Yes  Weight 5       Reps 10-15       Time 10 Minutes         Treadmill   MPH 1.3       Grade 0       Minutes 17       METs 1.99         NuStep   Level 1       SPM 89       Minutes 22       METs 1.99                Exercise Comments:   Exercise Goals and Review:   Exercise Goals     Row Name 09/25/22 1428 10/05/22 1532           Exercise Goals   Increase Physical Activity Yes Yes      Intervention Provide advice, education, support and counseling about physical activity/exercise needs.;Develop an individualized exercise prescription for aerobic and resistive training based on initial evaluation findings, risk stratification, comorbidities and participant's personal goals. Provide advice, education, support and counseling about physical activity/exercise needs.;Develop an individualized exercise prescription for aerobic and resistive training based on initial  evaluation findings, risk stratification, comorbidities and participant's personal goals.      Expected Outcomes Short Term: Attend rehab on a regular basis to increase amount of physical activity.;Long Term: Add in home exercise to make exercise part of routine and to increase amount of physical activity.;Long Term: Exercising regularly at least 3-5 days a week. Short Term: Attend rehab on a regular basis to increase amount of physical activity.;Long Term: Add in home exercise to make exercise part of routine and to increase amount of physical activity.;Long Term: Exercising regularly at least 3-5 days a week.      Increase Strength and Stamina Yes Yes      Intervention Provide advice, education, support and counseling about physical activity/exercise needs.;Develop an individualized exercise prescription for aerobic and resistive training based on initial evaluation findings, risk stratification, comorbidities and participant's personal goals. Provide advice, education, support and counseling about physical activity/exercise needs.;Develop an individualized exercise prescription for aerobic and resistive training based on initial evaluation findings, risk stratification, comorbidities and participant's personal goals.      Expected Outcomes Short Term: Increase workloads from initial exercise prescription for resistance, speed, and METs.;Short Term: Perform resistance training exercises routinely during rehab and add in resistance training at home;Long Term: Improve cardiorespiratory fitness, muscular endurance and strength as measured by increased METs and functional capacity (6MWT) Short Term: Increase workloads from initial exercise prescription for resistance, speed, and METs.;Short Term: Perform resistance training exercises routinely during rehab and add in resistance training at home;Long Term: Improve cardiorespiratory fitness, muscular endurance and strength as measured by increased METs and  functional capacity (6MWT)      Able to understand and use rate of perceived exertion (RPE) scale Yes Yes      Intervention Provide education and explanation on how to use RPE scale Provide education and explanation on how to use RPE scale      Expected Outcomes Short Term: Able to use RPE daily in rehab to express subjective intensity level;Long Term:  Able to use RPE to guide intensity level when exercising independently Short Term: Able to use RPE daily in rehab to express subjective intensity level;Long Term:  Able to use RPE to guide intensity level when exercising independently      Knowledge and understanding of Target Heart  Rate Range (THRR) Yes Yes      Intervention Provide education and explanation of THRR including how the numbers were predicted and where they are located for reference Provide education and explanation of THRR including how the numbers were predicted and where they are located for reference      Expected Outcomes Short Term: Able to state/look up THRR;Short Term: Able to use daily as guideline for intensity in rehab;Long Term: Able to use THRR to govern intensity when exercising independently Short Term: Able to state/look up THRR;Short Term: Able to use daily as guideline for intensity in rehab;Long Term: Able to use THRR to govern intensity when exercising independently      Able to check pulse independently Yes Yes      Intervention Provide education and demonstration on how to check pulse in carotid and radial arteries.;Review the importance of being able to check your own pulse for safety during independent exercise Provide education and demonstration on how to check pulse in carotid and radial arteries.;Review the importance of being able to check your own pulse for safety during independent exercise      Expected Outcomes Short Term: Able to explain why pulse checking is important during independent exercise;Long Term: Able to check pulse independently and accurately Short  Term: Able to explain why pulse checking is important during independent exercise;Long Term: Able to check pulse independently and accurately      Understanding of Exercise Prescription Yes Yes      Intervention Provide education, explanation, and written materials on patient's individual exercise prescription Provide education, explanation, and written materials on patient's individual exercise prescription      Expected Outcomes Short Term: Able to explain program exercise prescription;Long Term: Able to explain home exercise prescription to exercise independently Short Term: Able to explain program exercise prescription;Long Term: Able to explain home exercise prescription to exercise independently               Exercise Goals Re-Evaluation :  Exercise Goals Re-Evaluation     Row Name 10/05/22 1532             Exercise Goal Re-Evaluation   Exercise Goals Review Increase Physical Activity;Increase Strength and Stamina;Able to understand and use rate of perceived exertion (RPE) scale;Knowledge and understanding of Target Heart Rate Range (THRR);Able to check pulse independently;Understanding of Exercise Prescription       Comments Pt has completed 3 sessions of cardiac rehab. He is motivated during class and is starting to increase his SPM on the stepper. He is currently exercising at 1.99 METs on both the treadmill and stepper. Will continue to monitor and progress as able.       Expected Outcomes Through exercise at rehab and home, the patient will meet thier stated goals                 Discharge Exercise Prescription (Final Exercise Prescription Changes):  Exercise Prescription Changes - 10/05/22 1500       Response to Exercise   Blood Pressure (Admit) 132/74    Blood Pressure (Exercise) 130/70    Blood Pressure (Exit) 138/90    Heart Rate (Admit) 93 bpm    Heart Rate (Exercise) 100 bpm    Heart Rate (Exit) 90 bpm    Rating of Perceived Exertion (Exercise) 11     Duration Continue with 30 min of aerobic exercise without signs/symptoms of physical distress.    Intensity THRR unchanged      Progression   Progression Continue  to progress workloads to maintain intensity without signs/symptoms of physical distress.      Resistance Training   Training Prescription Yes    Weight 5    Reps 10-15    Time 10 Minutes      Treadmill   MPH 1.3    Grade 0    Minutes 17    METs 1.99      NuStep   Level 1    SPM 89    Minutes 22    METs 1.99             Nutrition:  Target Goals: Understanding of nutrition guidelines, daily intake of sodium 1500mg , cholesterol 200mg , calories 30% from fat and 7% or less from saturated fats, daily to have 5 or more servings of fruits and vegetables.  Biometrics:  Pre Biometrics - 09/25/22 1429       Pre Biometrics   Height 5\' 10"  (1.778 m)    Weight 90.5 kg    Waist Circumference 39 inches    Hip Circumference 41 inches    Waist to Hip Ratio 0.95 %    BMI (Calculated) 28.63    Triceps Skinfold 10 mm    % Body Fat 24.7 %    Grip Strength 42.4 kg    Flexibility 0 in    Single Leg Stand 10.65 seconds              Nutrition Therapy Plan and Nutrition Goals:  Nutrition Therapy & Goals - 09/25/22 1403       Personal Nutrition Goals   Comments Patient scored 148 on his diet assessment. He admits to not eating healthy and knows he needs to improve. We have educational sessions on heart healthy nutrition with handouts and we offer assistance with RD referral if patient is interested.      Intervention Plan   Intervention Nutrition handout(s) given to patient.    Expected Outcomes Short Term Goal: Understand basic principles of dietary content, such as calories, fat, sodium, cholesterol and nutrients.             Nutrition Assessments:  Nutrition Assessments - 09/25/22 1402       MEDFICTS Scores   Pre Score 148            MEDIFICTS Score Key: ?70 Need to make dietary changes   40-70 Heart Healthy Diet ? 40 Therapeutic Level Cholesterol Diet   Picture Your Plate Scores: [id number] Unhealthy dietary pattern with much room for improvement. 41-50 Dietary pattern unlikely to meet recommendations for good health and room for improvement. 51-60 More healthful dietary pattern, with some room for improvement.  >60 Healthy dietary pattern, although there may be some specific behaviors that could be improved.    Nutrition Goals Re-Evaluation:   Nutrition Goals Discharge (Final Nutrition Goals Re-Evaluation):   Psychosocial: Target Goals: Acknowledge presence or absence of significant depression and/or stress, maximize coping skills, provide positive support system. Participant is able to verbalize types and ability to use techniques and skills needed for reducing stress and depression.  Initial Review & Psychosocial Screening:  Initial Psych Review & Screening - 09/25/22 1416       Initial Review   Current issues with Current Sleep Concerns;Current Psychotropic Meds      Family Dynamics   Good Support System? Yes      Barriers   Psychosocial barriers to participate in program There are no identifiable barriers or psychosocial needs.      Screening Interventions  Interventions Encouraged to exercise;To provide support and resources with identified psychosocial needs;Provide feedback about the scores to participant    Expected Outcomes Short Term goal: Utilizing psychosocial counselor, staff and physician to assist with identification of specific Stressors or current issues interfering with healing process. Setting desired goal for each stressor or current issue identified.;Short Term goal: Identification and review with participant of any Quality of Life or Depression concerns found by scoring the questionnaire.             Quality of Life Scores:  Quality of Life - 09/25/22 1431       Quality of Life   Select Quality of Life      Quality of Life Scores    Health/Function Pre 21.2 %    Socioeconomic Pre 8.69 %    Psych/Spiritual Pre 24 %    Family Pre 25.2 %    GLOBAL Pre 19.47 %            Scores of 19 and below usually indicate a poorer quality of life in these areas.  A difference of  2-3 points is a clinically meaningful difference.  A difference of 2-3 points in the total score of the Quality of Life Index has been associated with significant improvement in overall quality of life, self-image, physical symptoms, and general health in studies assessing change in quality of life.  PHQ-9: Review Flowsheet       09/25/2022 07/23/2017  Depression screen PHQ 2/9  Decreased Interest 0 0  Down, Depressed, Hopeless 1 1  PHQ - 2 Score 1 1  Altered sleeping 2 3  Tired, decreased energy 1 1  Change in appetite 1 1  Feeling bad or failure about yourself  1 1  Trouble concentrating 0 0  Moving slowly or fidgety/restless 0 0  Suicidal thoughts 0 0  PHQ-9 Score 6 7  Difficult doing work/chores Not difficult at all Not difficult at all   Interpretation of Total Score  Total Score Depression Severity:  1-4 = Minimal depression, 5-9 = Mild depression, 10-14 = Moderate depression, 15-19 = Moderately severe depression, 20-27 = Severe depression   Psychosocial Evaluation and Intervention:  Psychosocial Evaluation - 09/25/22 1447       Psychosocial Evaluation & Interventions   Interventions Encouraged to exercise with the program and follow exercise prescription;Stress management education;Relaxation education    Comments Patient has no psychosocial barriers identified at his orientation visit. His PHQ-9 score was 6 due to trouble sleeping; low energy; overeating; and he does admit to feeling down at times when he looks in the mirror and sees his scars and remembers all he has been through. 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 He uses melatonin and trazodone for sleep with some  improvement. He denies any depression or anxiety. He has good support from his wife and his mother and his wife's parents. He has twin boys 84 months old and they keep him busy. He is still on long term disability with good year but is working on getting permanent disability. He has participated in the program in 2018 with a valve replacement. He is ready to start the program hoping to get healthier overall and be able to do his normal activities and finish his degree.    Expected Outcomes Patient will continue to have no psychosocial barriers identified.             Psychosocial  Re-Evaluation:  Psychosocial Re-Evaluation     Row Name 09/28/22 [phone]             Psychosocial Re-Evaluation   Current issues with None Identified       Comments Patient is new to the program. He plans to start today 3/18. We will continue to monitor his progress in the program.       Expected Outcomes Patient will continue to have no psychosocial barriers identified.       Interventions Stress management education;Encouraged to attend Cardiac Rehabilitation for the exercise;Relaxation education       Continue Psychosocial Services  No Follow up required                Psychosocial Discharge (Final Psychosocial Re-Evaluation):  Psychosocial Re-Evaluation - 09/28/22 0855       Psychosocial Re-Evaluation   Current issues with None Identified    Comments Patient is new to the program. He plans to start today 3/18. We will continue to monitor his progress in the program.    Expected Outcomes Patient will continue to have no psychosocial barriers identified.    Interventions Stress management education;Encouraged to attend Cardiac Rehabilitation for the exercise;Relaxation education    Continue Psychosocial Services  No Follow up required             Vocational Rehabilitation: Provide vocational rehab assistance to qualifying candidates.   Vocational Rehab Evaluation & Intervention:  Vocational  Rehab - 09/25/22 1405       Initial Vocational Rehab Evaluation & Intervention   Assessment shows need for Vocational Rehabilitation No      Vocational Rehab Re-Evaulation   Comments Patient is out of work on long term disability and is working on getting permanent disability.             Education: Education Goals: Education classes will be provided on a weekly basis, covering required topics. Participant will state understanding/return demonstration of topics presented.  Learning Barriers/Preferences:  Learning Barriers/Preferences - 09/25/22 1404       Learning Barriers/Preferences   Learning Barriers None    Learning Preferences Skilled Demonstration;Audio             Education Topics: Hypertension, Hypertension Reduction -Define heart disease and high blood pressure. Discus how high blood pressure affects the body and ways to reduce high blood pressure.   Exercise and Your Heart -Discuss why it is important to exercise, the FITT principles of exercise, normal and abnormal responses to exercise, and how to exercise safely. Flowsheet Row CARDIAC REHAB PHASE II EXERCISE from 09/08/2017 in [facility]  Date 08/25/17  Educator Labette  Instruction Review Code 2- Demonstrated Understanding       Angina -Discuss definition of angina, causes of angina, treatment of angina, and how to decrease risk of having angina. Flowsheet Row CARDIAC REHAB PHASE II EXERCISE from 09/08/2017 in [place]  Date 09/01/17  Educator DJ  Instruction Review Code 2- Demonstrated Understanding       Cardiac Medications -Review what the following cardiac medications are used for, how they affect the body, and side effects that may occur when taking the medications.  Medications include Aspirin, Beta blockers, calcium channel blockers, ACE Inhibitors, angiotensin receptor blockers, diuretics, digoxin, and antihyperlipidemics. Flowsheet Row CARDIAC  REHAB PHASE II EXERCISE from 09/08/2017 in [place]  Date 09/08/17  Educator DJ  Instruction Review Code 2- Demonstrated Understanding       Congestive Heart Failure -Discuss  the definition of CHF, how to live with CHF, the signs and symptoms of CHF, and how keep track of weight and sodium intake.   Heart Disease and Intimacy -Discus the effect sexual activity has on the heart, how changes occur during intimacy as we age, and safety during sexual activity.   Smoking Cessation / COPD -Discuss different methods to quit smoking, the health benefits of quitting smoking, and the definition of COPD.   Nutrition I: Fats -Discuss the types of cholesterol, what cholesterol does to the heart, and how cholesterol levels can be controlled.   Nutrition II: Labels -Discuss the different components of food labels and how to read food label   Heart Parts/Heart Disease and PAD -Discuss the anatomy of the heart, the pathway of blood circulation through the heart, and these are affected by heart disease.   Stress I: Signs and Symptoms -Discuss the causes of stress, how stress may lead to anxiety and depression, and ways to limit stress. Flowsheet Row CARDIAC REHAB PHASE II EXERCISE from 09/08/2017 in [place]  Date 07/28/17  Educator DJ  Instruction Review Code 2- Demonstrated Understanding       Stress II: Relaxation -Discuss different types of relaxation techniques to limit stress. Flowsheet Row CARDIAC REHAB PHASE II EXERCISE from 09/08/2017 in [place]  Date 08/04/17  Educator DC  Instruction Review Code 2- Demonstrated Understanding       Warning Signs of Stroke / TIA -Discuss definition of a stroke, what the signs and symptoms are of a stroke, and how to identify when someone is having stroke. Flowsheet Row CARDIAC REHAB PHASE II EXERCISE from 09/08/2017 in [place]  Date 08/11/17   Educator DC  Instruction Review Code 2- Demonstrated Understanding       Knowledge Questionnaire Score:  Knowledge Questionnaire Score - 09/25/22 1405       Knowledge Questionnaire Score   Pre Score 20/24             Core Components/Risk Factors/Patient Goals at Admission:  Personal Goals and Risk Factors at Admission - 09/25/22 1413       Core Components/Risk Factors/Patient Goals on Admission    Weight Management Obesity    Improve shortness of breath with ADL's Yes    Intervention Provide education, individualized exercise plan and daily activity instruction to help decrease symptoms of SOB with activities of daily living.    Expected Outcomes Short Term: Improve cardiorespiratory fitness to achieve a reduction of symptoms when performing ADLs;Long Term: Be able to perform more ADLs without symptoms or delay the onset of symptoms    Hypertension Yes    Intervention Provide education on lifestyle modifcations including regular physical activity/exercise, weight management, moderate sodium restriction and increased consumption of fresh fruit, vegetables, and low fat dairy, alcohol moderation, and smoking cessation.;Monitor prescription use compliance.    Expected Outcomes Short Term: Continued assessment and intervention until BP is < 140/77mm HG in hypertensive participants. < 130/59mm HG in hypertensive participants with diabetes, heart failure or chronic kidney disease.;Long Term: Maintenance of blood pressure at goal levels.    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    Intervention Patient will attend CR 3 days/week with exercise and education.    Expected Outcomes Patient will complete the program meeting both  personal and program goals.             Core Components/Risk Factors/Patient Goals Review:    Goals and Risk Factor Review     Row Name 09/28/22 0855             Core Components/Risk Factors/Patient Goals Review   Personal Goals Review Weight Management/Obesity;Hypertension;Other;Improve shortness of breath with ADL's       Review Patient was referred to CR with s/p heart transplant. He has multiple risk factors for CAD and is participating in the program for risk modification. He plans to start the program today 3/18. He is currently taking Metformin but does not have a diagnosis of DM. He says he is taking the medication because of other medications he is taking. 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 We will continue to monitor his progress as he works towards meeting these goals.       Expected Outcomes Patient will complete the program meeting both personal and program goals.                Core Components/Risk Factors/Patient Goals at Discharge (Final Review):   Goals and Risk Factor Review - 09/28/22 0855       Core Components/Risk Factors/Patient Goals Review   Personal Goals Review Weight Management/Obesity;Hypertension;Other;Improve shortness of breath with ADL's    Review Patient was referred to CR with s/p heart transplant. He has multiple risk factors for CAD and is participating in the program for risk modification. He plans to start the program today 3/18. He is currently taking Metformin but does not have a diagnosis of DM. He says he is taking the medication because of other medications he is taking. 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 We will continue to monitor his progress as he works towards meeting these goals.    Expected Outcomes Patient will complete the program meeting both personal and  program goals.             ITP Comments:   Comments: ITP REVIEW Pt is making expected progress toward Cardiac Rehab goals after completing 3 sessions. Recommend continued exercise, life style modification, education, and increased stamina and strength.

## 2022-10-09 ENCOUNTER — Encounter (HOSPITAL_COMMUNITY): Payer: BC Managed Care – PPO

## 2022-10-12 ENCOUNTER — Encounter (HOSPITAL_COMMUNITY)
Admission: RE | Admit: 2022-10-12 | Discharge: 2022-10-12 | Disposition: A | Discharge status: Home / Self Care | Payer: BC Managed Care – PPO | Source: Ambulatory Visit | Attending: Internal Medicine | Admitting: Internal Medicine

## 2022-10-12 VITALS — Wt 203.5 lb

## 2022-10-12 DIAGNOSIS — Z941 Heart transplant status: Secondary | ICD-10-CM | POA: Diagnosis not present

## 2022-10-12 NOTE — Progress Notes (Signed)
Daily Session Note  Patient Details  Name: [name] [name] MRN: [id number] Date of Birth: October 01, 1980 Referring Provider:   Flowsheet Row CARDIAC REHAB PHASE II ORIENTATION from 09/25/2022 in [place]  Referring Provider Dr. Haroldine Laws       Encounter Date: 10/12/2022  Check In:  Session Check In - 10/12/22 1434       Check-In   Supervising physician immediately available to respond to emergencies [facility] MD immediately available    Physician(s) Dr Dellia Cloud    Location [facility]    Staff Present Leana Roe, BS, Exercise Physiologist;Keeshia Sanderlin Hassell Done, RN, BSN    Virtual Visit No    Medication changes reported     No    Fall or balance concerns reported    No    Tobacco Cessation No Change    Warm-up and Cool-down Performed as group-led instruction    Resistance Training Performed Yes    VAD Patient? No    PAD/SET Patient? No      Pain Assessment   Currently in Pain? No/denies    Pain Score 0-No pain    Multiple Pain Sites No             Capillary Blood Glucose: No results found for this or any previous visit (from the past 24 hour(s)).    Social History   Tobacco Use  Smoking Status Never  Smokeless Tobacco Never    Goals Met:  Independence with exercise equipment Changing diet to healthy choices, watching portion sizes No report of concerns or symptoms today Strength training completed today  Goals Unmet:  Not Applicable  Comments: Checkout at 1545.   Dr. Carlyle Dolly is Medical Director for [facility] Cardiac Rehab

## 2022-10-14 ENCOUNTER — Encounter (HOSPITAL_COMMUNITY): Payer: BC Managed Care – PPO

## 2022-10-16 ENCOUNTER — Encounter (HOSPITAL_COMMUNITY): Payer: BC Managed Care – PPO

## 2022-10-19 ENCOUNTER — Encounter (HOSPITAL_COMMUNITY): Admission: RE | Admit: 2022-10-19 | Payer: BC Managed Care – PPO | Source: Ambulatory Visit

## 2022-10-19 IMAGING — DX DG ELBOW COMPLETE 3+V*R*
4 series · 4 of 4 positions shown · non-contrast
Comparison: None.

CLINICAL DATA: Right elbow pain with movement.

EXAM:
RIGHT ELBOW - COMPLETE 3+ VIEW

[elbow ap]
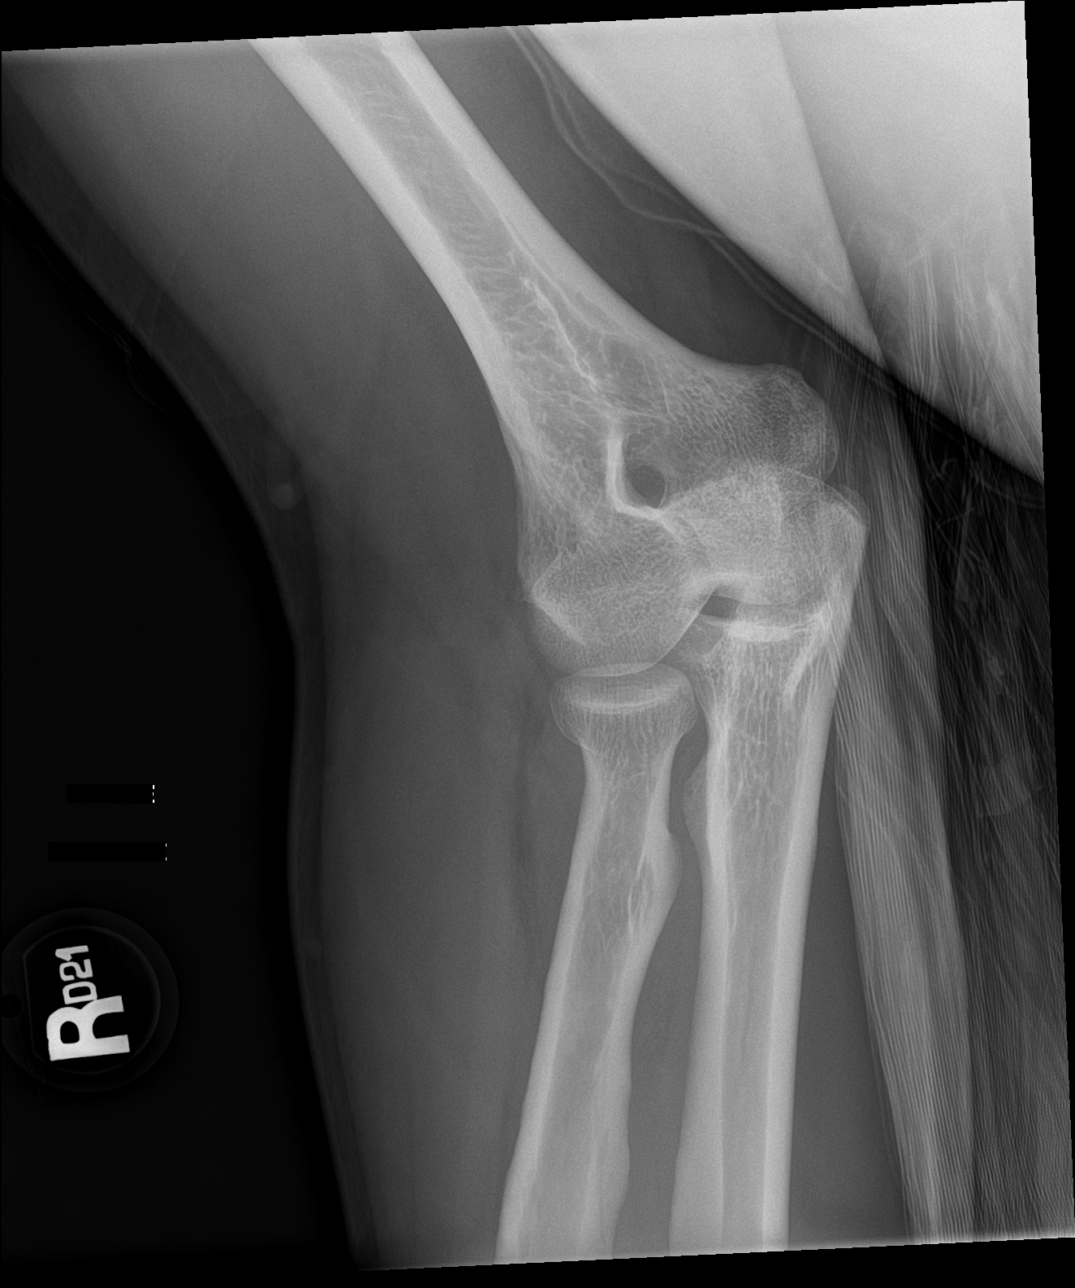

[elbow obl (1 of 2)]
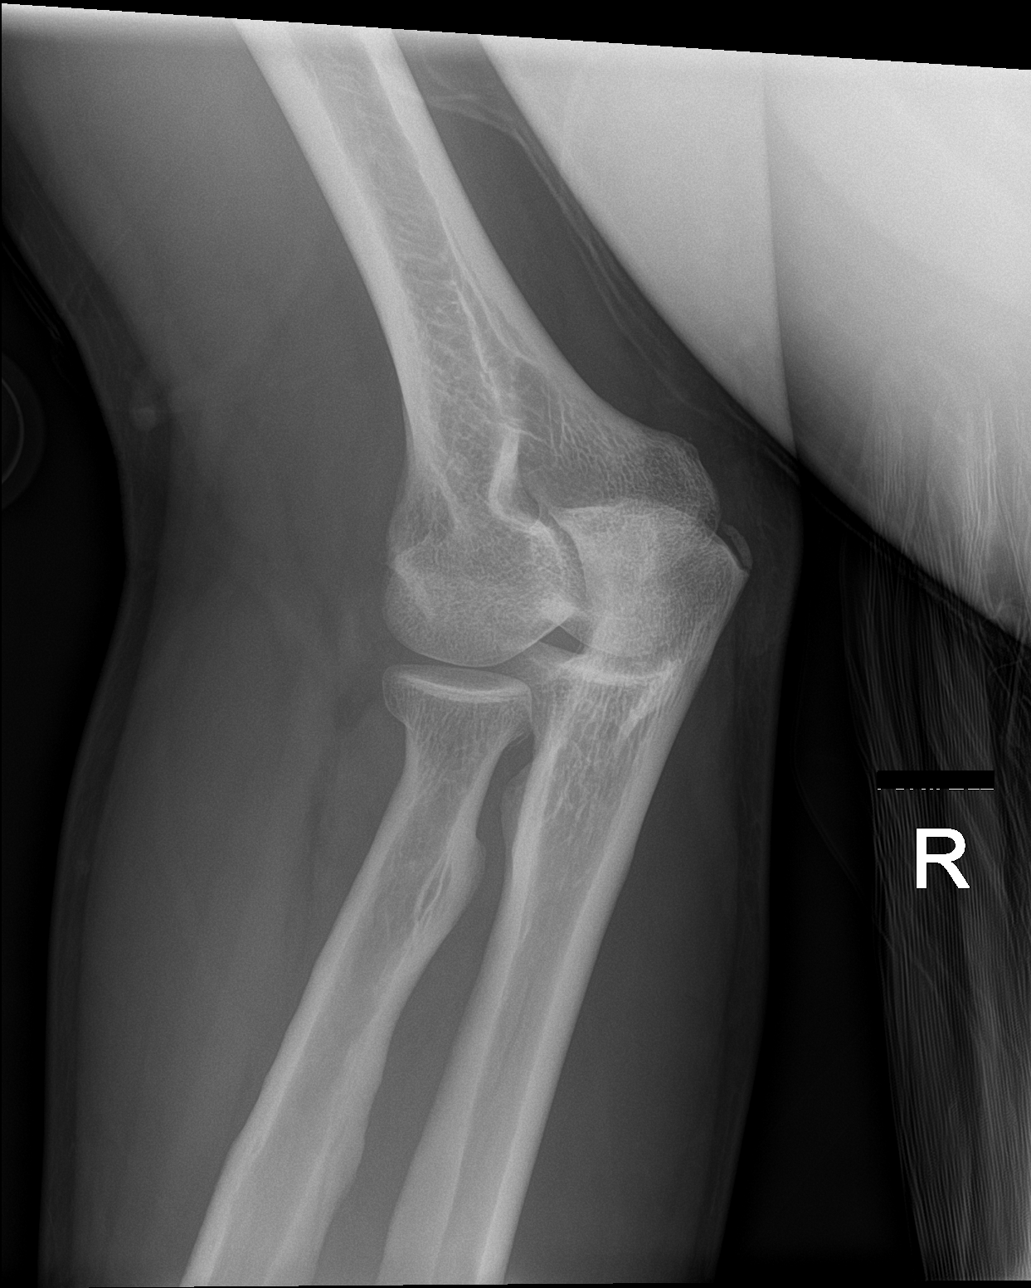

[elbow lat]
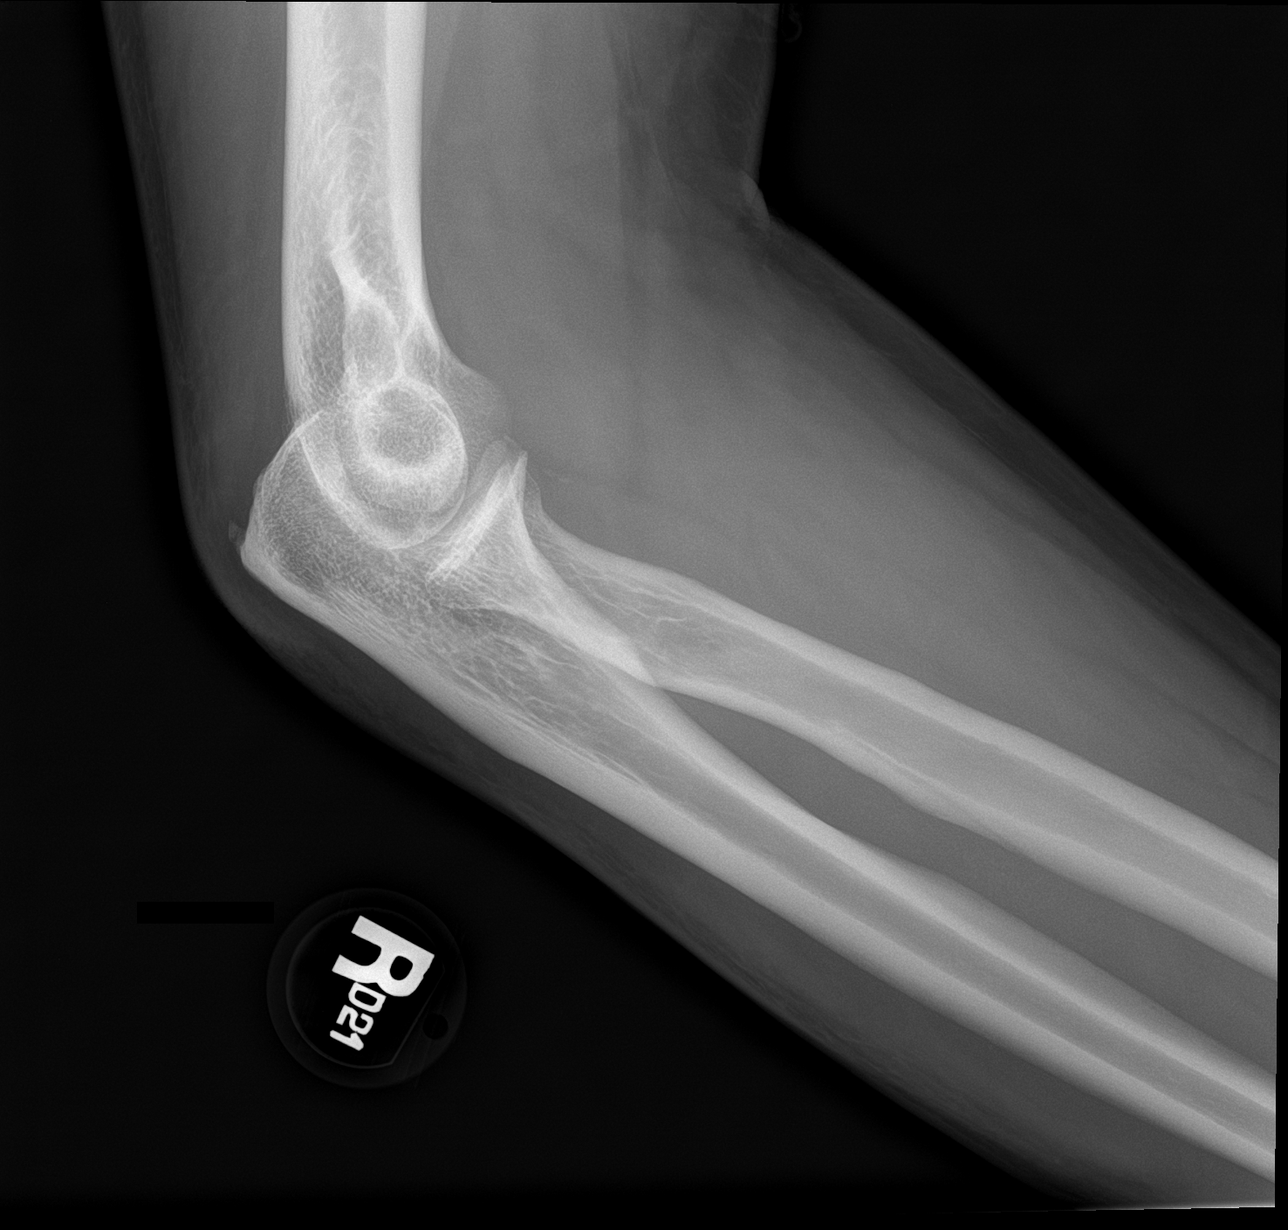

[elbow obl (2 of 2)]
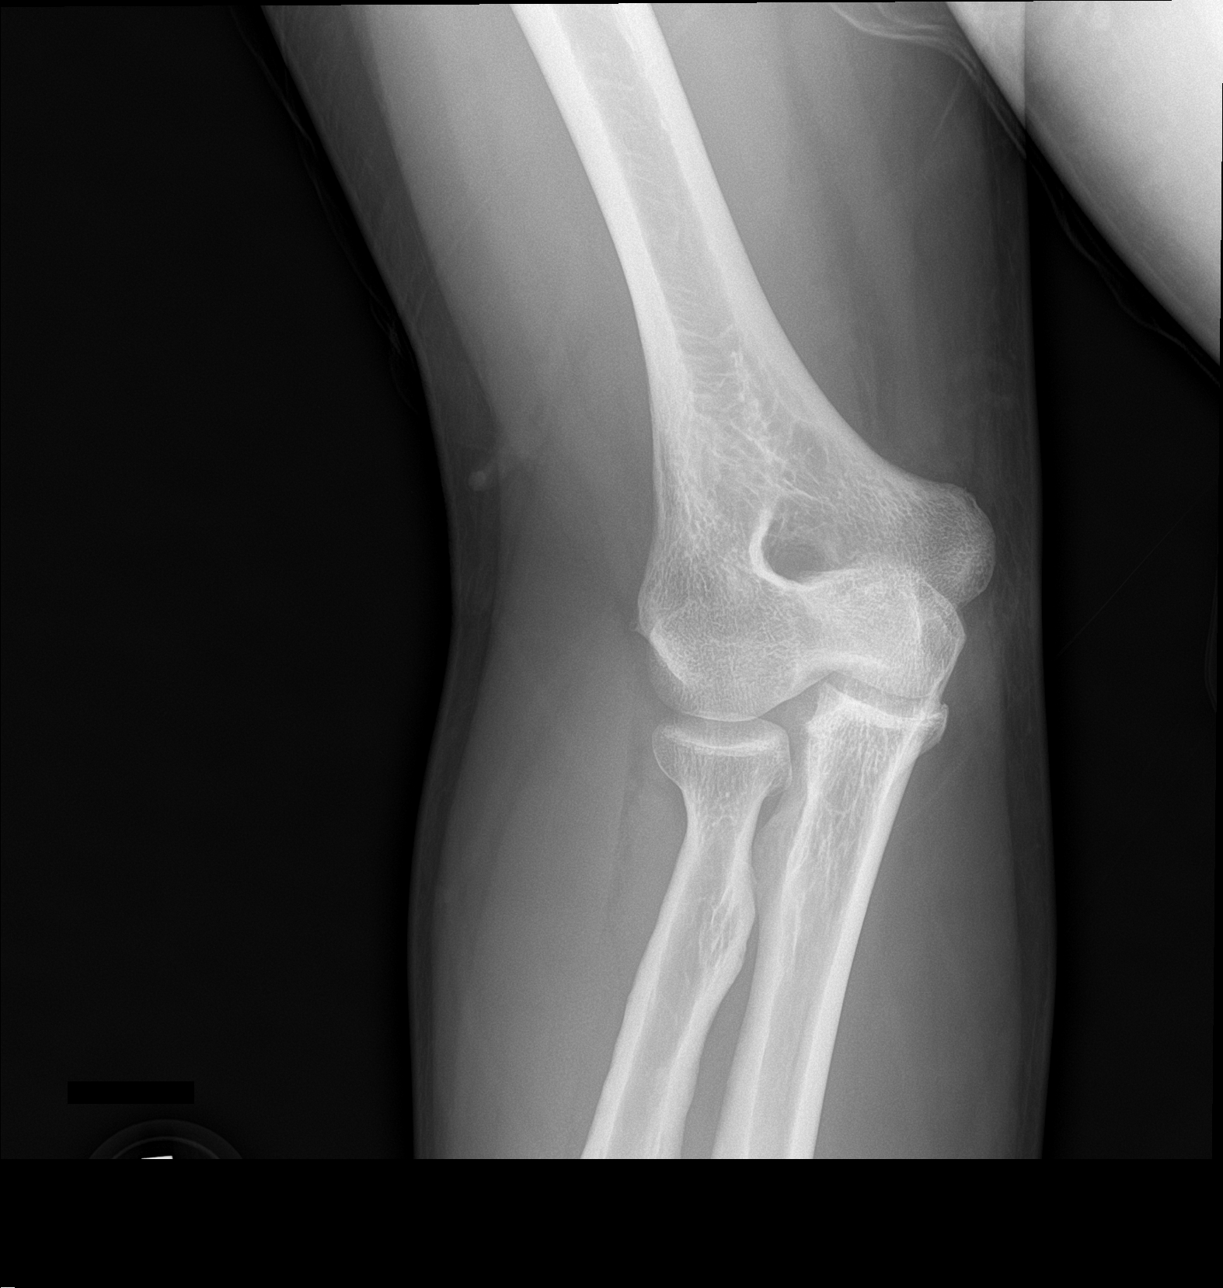

[4 of 4 positions shown; findings below may reference images not displayed]

FINDINGS: There is no evidence of an acute fracture, dislocation, or joint
effusion. A small bony spur is seen along the right olecranon
process. A mild amount of adjacent dorsal soft tissue swelling is
noted.
IMPRESSION: Degenerative changes without an acute osseous abnormality.

## 2022-10-21 ENCOUNTER — Encounter (HOSPITAL_COMMUNITY): Admission: RE | Admit: 2022-10-21 | Payer: BC Managed Care – PPO | Source: Ambulatory Visit

## 2022-10-23 ENCOUNTER — Encounter (HOSPITAL_COMMUNITY)
Admission: RE | Admit: 2022-10-23 | Discharge: 2022-10-23 | Disposition: A | Discharge status: Home / Self Care | Payer: BC Managed Care – PPO | Source: Ambulatory Visit | Attending: Internal Medicine | Admitting: Internal Medicine

## 2022-10-23 DIAGNOSIS — Z941 Heart transplant status: Secondary | ICD-10-CM | POA: Diagnosis not present

## 2022-10-23 NOTE — Progress Notes (Signed)
Daily Session Note  Patient Details  Name: [name] [name] MRN: [id number] Date of Birth: 04-Jan-1981 Referring Provider:   Flowsheet Row CARDIAC REHAB PHASE II ORIENTATION from 09/25/2022 in [facility] CARDIAC REHABILITATION  Referring Provider Dr. Gala Romney       Encounter Date: 10/23/2022  Check In:  Session Check In - 10/23/22 1445       Check-In   Supervising physician immediately available to respond to emergencies [facility] MD immediately available    Physician(s) Dr. Wyline Mood    Location [facility]    Staff Present Ross Ludwig, BS, Exercise Physiologist;Ashanty Coltrane Laural Benes, RN, BSN    Virtual Visit No    Medication changes reported     No    Fall or balance concerns reported    No    Tobacco Cessation No Change    Warm-up and Cool-down Performed as group-led instruction    Resistance Training Performed Yes    VAD Patient? No    PAD/SET Patient? No      Pain Assessment   Currently in Pain? No/denies    Pain Score 0-No pain    Multiple Pain Sites No             Capillary Blood Glucose: No results found for this or any previous visit (from the past 24 hour(s)).    Social History   Tobacco Use  Smoking Status Never  Smokeless Tobacco Never    Goals Met:  Independence with exercise equipment Exercise tolerated well No report of concerns or symptoms today Strength training completed today  Goals Unmet:  Not Applicable  Comments: Check out 1545.   Dr. Dina Rich is Medical Director for [facility] Cardiac Rehab

## 2022-10-26 ENCOUNTER — Encounter (HOSPITAL_COMMUNITY): Payer: BC Managed Care – PPO

## 2022-10-28 ENCOUNTER — Encounter (HOSPITAL_COMMUNITY)
Admission: RE | Admit: 2022-10-28 | Discharge: 2022-10-28 | Disposition: A | Discharge status: Home / Self Care | Payer: BC Managed Care – PPO | Source: Ambulatory Visit | Attending: Internal Medicine | Admitting: Internal Medicine

## 2022-10-28 DIAGNOSIS — Z941 Heart transplant status: Secondary | ICD-10-CM

## 2022-10-28 NOTE — Progress Notes (Signed)
Daily Session Note  Patient Details  Name: [name] [name] MRN: [id number] Date of Birth: 12-04-1980 Referring Provider:   Flowsheet Row CARDIAC REHAB PHASE II ORIENTATION from 09/25/2022 in [facility] CARDIAC REHABILITATION  Referring Provider Dr. Gala Romney       Encounter Date: 10/28/2022  Check In:  Session Check In - 10/28/22 1436       Check-In   Supervising physician immediately available to respond to emergencies [facility] MD immediately available    Physician(s) Dr. Diona Browner    Location [facility]    Staff Present Ross Ludwig, BS, Exercise Physiologist;Hillary Troutman BSN, RN    Virtual Visit No    Medication changes reported     No    Fall or balance concerns reported    No    Tobacco Cessation No Change    Warm-up and Cool-down Performed as group-led instruction    Resistance Training Performed Yes    VAD Patient? No    PAD/SET Patient? No      Pain Assessment   Currently in Pain? No/denies    Pain Score 0-No pain    Multiple Pain Sites No             Capillary Blood Glucose: No results found for this or any previous visit (from the past 24 hour(s)).    Social History   Tobacco Use  Smoking Status Never  Smokeless Tobacco Never    Goals Met:  Independence with exercise equipment Exercise tolerated well No report of concerns or symptoms today Strength training completed today  Goals Unmet:  Not Applicable  Comments: check out 1545   Dr. Dina Rich is Medical Director for [facility] Cardiac Rehab

## 2022-10-30 ENCOUNTER — Encounter (HOSPITAL_COMMUNITY): Payer: BC Managed Care – PPO

## 2022-11-02 ENCOUNTER — Encounter (HOSPITAL_COMMUNITY): Payer: BC Managed Care – PPO

## 2022-11-04 ENCOUNTER — Encounter (HOSPITAL_COMMUNITY): Payer: BC Managed Care – PPO

## 2022-11-04 NOTE — Progress Notes (Signed)
Cardiac Individual Treatment Plan  Patient Details  Name: [name] [name] MRN: [id number] Date of Birth: 1981-05-11 Referring Provider:   Flowsheet Row CARDIAC REHAB PHASE II ORIENTATION from 09/25/2022 in [facility] CARDIAC REHABILITATION  Referring Provider Dr. Gala Romney       Initial Encounter Date:  Flowsheet Row CARDIAC REHAB PHASE II ORIENTATION from 09/25/2022 in [place] CARDIAC REHABILITATION  Date 09/25/22       Visit Diagnosis: Status post heart transplant  Patient's Home Medications on Admission:  Current Outpatient Medications:    acetaminophen (TYLENOL) 325 MG tablet, Take 325 mg by mouth in the morning and at bedtime., Disp: , Rfl:    allopurinol (ZYLOPRIM) 300 MG tablet, Take 300 mg by mouth daily. (Patient not taking: Reported on 09/25/2022), Disp: , Rfl:    amiodarone (PACERONE) 200 MG tablet, Take 1 tablet (200 mg total) by mouth daily. (Patient not taking: Reported on 09/25/2022), Disp: 30 tablet, Rfl: 11   aspirin 81 MG chewable tablet, Chew 81 mg by mouth daily., Disp: , Rfl:    Calcium Carb-Cholecalciferol 600-10 MG-MCG TABS, Take 1 tablet by mouth in the morning and at bedtime., Disp: , Rfl:    carvedilol (COREG) 3.125 MG tablet, Take 3.125 mg by mouth in the morning and at bedtime., Disp: , Rfl:    furosemide (LASIX) 10 MG/ML injection, Inject 6 mLs (60 mg total) into the vein 2 (two) times daily. (Patient not taking: Reported on 09/25/2022), Disp: 4 mL, Rfl: 0   gabapentin (NEURONTIN) 100 MG capsule, Take 100 mg by mouth daily., Disp: , Rfl:    heparin 95621 UT/250ML infusion, Inject 1,600 Units/hr into the vein continuous. (Patient not taking: Reported on 09/25/2022), Disp: , Rfl:    hydrALAZINE (APRESOLINE) 10 MG tablet, Take 1 tablet (10 mg total) by mouth every 8 (eight) hours. (Patient not taking: Reported on 09/25/2022), Disp: , Rfl:    isosorbide mononitrate (IMDUR) 30 MG 24 hr tablet, Take 1 tablet (30 mg total) by mouth daily. (Patient not taking:  Reported on 09/25/2022), Disp: , Rfl:    magnesium oxide (MAG-OX) 400 (240 Mg) MG tablet, Take 2 tablets by mouth 2 (two) times daily., Disp: , Rfl:    metFORMIN (GLUCOPHAGE-XR) 500 MG 24 hr tablet, Take 500 mg by mouth 2 (two) times daily., Disp: , Rfl:    metoCLOPramide (REGLAN) 5 MG/ML injection, Inject 2 mLs (10 mg total) into the vein every 6 (six) hours. (Patient not taking: Reported on 09/25/2022), Disp: 2 mL, Rfl: 0   milrinone (PRIMACOR) 20 MG/100 ML SOLN infusion, Inject 0.0403 mg/min into the vein continuous. (Patient not taking: Reported on 09/25/2022), Disp: , Rfl:    mycophenolate (CELLCEPT) 500 MG tablet, Take 1,000 mg by mouth 2 (two) times daily., Disp: , Rfl:    nystatin (MYCOSTATIN) 100000 UNIT/ML suspension, Take 5 mLs by mouth 4 (four) times daily., Disp: , Rfl:    ondansetron (ZOFRAN) 4 MG/2ML SOLN injection, Inject 2 mLs (4 mg total) into the vein every 6 (six) hours as needed for nausea. (Patient not taking: Reported on 09/25/2022), Disp: 2 mL, Rfl: 0   oxyCODONE (OXY IR/ROXICODONE) 5 MG immediate release tablet, Take 5 mg by mouth every 6 (six) hours as needed for breakthrough pain., Disp: , Rfl:    pantoprazole (PROTONIX) 40 MG tablet, Take 40 mg by mouth daily., Disp: , Rfl:    pravastatin (PRAVACHOL) 40 MG tablet, Take 40 mg by mouth daily., Disp: , Rfl:    predniSONE (DELTASONE) 5 MG  tablet, Take 7.5 mg by mouth in the morning and at bedtime., Disp: , Rfl:    promethazine (PHENERGAN) 25 MG tablet, Take 1 tablet (25 mg total) by mouth every 6 (six) hours as needed for nausea or vomiting. (Patient not taking: Reported on 09/25/2022), Disp: 30 tablet, Rfl: 0   SENEXON-S 8.6-50 MG tablet, Take 2 tablets by mouth 2 (two) times daily., Disp: , Rfl:    sertraline (ZOLOFT) 50 MG tablet, Take 1 tablet (50 mg total) by mouth daily. (Patient not taking: Reported on 09/25/2022), Disp: 90 tablet, Rfl: 3   spironolactone (ALDACTONE) 25 MG tablet, Take 1 tablet (25 mg total) by mouth daily.  (Patient not taking: Reported on 09/25/2022), Disp: , Rfl:    sulfamethoxazole-trimethoprim (BACTRIM DS) 800-160 MG tablet, Take 1 tablet by mouth daily., Disp: , Rfl:    tacrolimus (PROGRAF) 1 MG capsule, Take 4 mg by mouth 2 (two) times daily., Disp: , Rfl:    temazepam (RESTORIL) 30 MG capsule, Take 1 capsule (30 mg total) by mouth at bedtime as needed for sleep. (Patient not taking: Reported on 09/25/2022), Disp: 30 capsule, Rfl: 0   torsemide (DEMADEX) 20 MG tablet, Take 20 mg by mouth 2 (two) times daily., Disp: , Rfl:    traZODone (DESYREL) 100 MG tablet, Take 100 mg by mouth at bedtime., Disp: , Rfl:    valGANciclovir (VALCYTE) 450 MG tablet, Take 450 mg by mouth daily., Disp: , Rfl:    VITAMIN D3 1.25 MG (50000 UT) capsule, Take 50,000 Units by mouth once a week., Disp: , Rfl:   Past Medical History: Past Medical History:  Diagnosis Date   Anxiety    CHF (congestive heart failure) (HCC)    Chronic systolic heart failure (HCC)    CKD (chronic kidney disease) stage 2, GFR 60-89 ml/min    Dyspnea    with value issues- "if I dont take my medication"   Essential hypertension, benign    Headache    History of pneumonia    Mitral regurgitation    Moderate   Noncompliance    Nonischemic cardiomyopathy (HCC)    Normal coronaries May 2012, LVEF < 20%   Pneumonia 2011   S/P aortic valve repair 05/11/2017   Complex valvuloplasty including plication of left coronary leaflet and 19mm Biostable HAART ring annuloplasty    Tobacco Use: Social History   Tobacco Use  Smoking Status Never  Smokeless Tobacco Never    Labs: Review Flowsheet  More data exists      Latest Ref Rng & Units 03/23/2022 04/03/2022 04/04/2022 04/05/2022 04/06/2022  Labs for ITP Cardiac and Pulmonary Rehab  Bicarbonate 20.0 - 28.0 mmol/L - 23.7  24.9  - 25.9  -  TCO2 22 - 32 mmol/L 22 - 32 mmol/L - 25  26  - - -  O2 Saturation % 63.2  55  54  60.6  65.2  58.5  63.6  60.8     Capillary Blood Glucose: Lab  Results  Component Value Date   GLUCAP 110 (H) 09/25/2022   GLUCAP 143 (H) 04/05/2022   GLUCAP 196 (H) 03/23/2022   GLUCAP 115 (H) 03/23/2022   GLUCAP 181 (H) 03/22/2022     Exercise Target Goals: Exercise Program Goal: Individual exercise prescription set using results from initial 6 min walk test and THRR while considering  patient's activity barriers and safety.   Exercise Prescription Goal: Starting with aerobic activity 30 plus minutes a day, 3 days per week for initial exercise prescription.  Provide home exercise prescription and guidelines that participant acknowledges understanding prior to discharge.  Activity Barriers & Risk Stratification:  Activity Barriers & Cardiac Risk Stratification - 09/25/22 1250       Activity Barriers & Cardiac Risk Stratification   Activity Barriers Shortness of Breath;Incisional Pain    Cardiac Risk Stratification High             6 Minute Walk:  6 Minute Walk     Row Name 09/25/22 1425         6 Minute Walk   Phase Initial     Distance 1300 feet     Walk Time 6 minutes     # of Rest Breaks 0     MPH 2.46     METS 4.37     RPE 12     VO2 Peak 15.3     Symptoms No     Resting HR 75 bpm     Resting BP 120/80     Resting Oxygen Saturation  98 %     Exercise Oxygen Saturation  during 6 min walk 98 %     Max Ex. HR 85 bpm     Max Ex. BP 140/80     2 Minute Post BP 122/80              Oxygen Initial Assessment:   Oxygen Re-Evaluation:   Oxygen Discharge (Final Oxygen Re-Evaluation):   Initial Exercise Prescription:  Initial Exercise Prescription - 09/25/22 1400       Date of Initial Exercise RX and Referring Provider   Date 09/25/22    Referring Provider Dr. Gala Romney    Expected Discharge Date 12/16/22      Treadmill   MPH 1    Grade 0    Minutes 17      NuStep   Level 1    SPM 80    Minutes 22      Prescription Details   Frequency (times per week) 3    Duration Progress to 30 minutes of  continuous aerobic without signs/symptoms of physical distress      Intensity   THRR 40-80% of Max Heartrate 72-143    Ratings of Perceived Exertion 11-13      Resistance Training   Training Prescription Yes    Weight 4    Reps 10-15             Perform Capillary Blood Glucose checks as needed.  Exercise Prescription Changes:   Exercise Prescription Changes     Row Name 10/05/22 1500 10/12/22 1500 10/28/22 1500         Response to Exercise   Blood Pressure (Admit) 132/74 120/76 130/80     Blood Pressure (Exercise) 130/70 150/80 138/80     Blood Pressure (Exit) 138/90 130/76 130/80     Heart Rate (Admit) 93 bpm 70 bpm 89 bpm     Heart Rate (Exercise) 100 bpm 82 bpm 93 bpm     Heart Rate (Exit) 90 bpm 75 bpm 88 bpm     Rating of Perceived Exertion (Exercise) 11 11 11      Duration Continue with 30 min of aerobic exercise without signs/symptoms of physical distress. Continue with 30 min of aerobic exercise without signs/symptoms of physical distress. Continue with 30 min of aerobic exercise without signs/symptoms of physical distress.     Intensity THRR unchanged THRR unchanged THRR unchanged       Progression   Progression Continue to progress workloads  to maintain intensity without signs/symptoms of physical distress. Continue to progress workloads to maintain intensity without signs/symptoms of physical distress. Continue to progress workloads to maintain intensity without signs/symptoms of physical distress.       Resistance Training   Training Prescription Yes Yes Yes     Weight 5 4 4      Reps 10-15 10-15 10-15     Time 10 Minutes 10 Minutes 10 Minutes       Treadmill   MPH 1.3 1.7 1.7     Grade 0 0 0     Minutes 17 17 17      METs 1.99 2.3 2.3       NuStep   Level 1 2 2      SPM 89 87 106     Minutes 22 22 22      METs 1.99 2 2.22              Exercise Comments:   Exercise Goals and Review:   Exercise Goals     Row Name 09/25/22 1428 10/05/22  1532 11/02/22 1450         Exercise Goals   Increase Physical Activity Yes Yes Yes     Intervention Provide advice, education, support and counseling about physical activity/exercise needs.;Develop an individualized exercise prescription for aerobic and resistive training based on initial evaluation findings, risk stratification, comorbidities and participant's personal goals. Provide advice, education, support and counseling about physical activity/exercise needs.;Develop an individualized exercise prescription for aerobic and resistive training based on initial evaluation findings, risk stratification, comorbidities and participant's personal goals. Provide advice, education, support and counseling about physical activity/exercise needs.;Develop an individualized exercise prescription for aerobic and resistive training based on initial evaluation findings, risk stratification, comorbidities and participant's personal goals.     Expected Outcomes Short Term: Attend rehab on a regular basis to increase amount of physical activity.;Long Term: Add in home exercise to make exercise part of routine and to increase amount of physical activity.;Long Term: Exercising regularly at least 3-5 days a week. Short Term: Attend rehab on a regular basis to increase amount of physical activity.;Long Term: Add in home exercise to make exercise part of routine and to increase amount of physical activity.;Long Term: Exercising regularly at least 3-5 days a week. Short Term: Attend rehab on a regular basis to increase amount of physical activity.;Long Term: Add in home exercise to make exercise part of routine and to increase amount of physical activity.;Long Term: Exercising regularly at least 3-5 days a week.     Increase Strength and Stamina Yes Yes Yes     Intervention Provide advice, education, support and counseling about physical activity/exercise needs.;Develop an individualized exercise prescription for aerobic and  resistive training based on initial evaluation findings, risk stratification, comorbidities and participant's personal goals. Provide advice, education, support and counseling about physical activity/exercise needs.;Develop an individualized exercise prescription for aerobic and resistive training based on initial evaluation findings, risk stratification, comorbidities and participant's personal goals. Provide advice, education, support and counseling about physical activity/exercise needs.;Develop an individualized exercise prescription for aerobic and resistive training based on initial evaluation findings, risk stratification, comorbidities and participant's personal goals.     Expected Outcomes Short Term: Increase workloads from initial exercise prescription for resistance, speed, and METs.;Short Term: Perform resistance training exercises routinely during rehab and add in resistance training at home;Long Term: Improve cardiorespiratory fitness, muscular endurance and strength as measured by increased METs and functional capacity ( ) Short Term: Increase workloads from initial exercise prescription  for resistance, speed, and METs.;Short Term: Perform resistance training exercises routinely during rehab and add in resistance training at home;Long Term: Improve cardiorespiratory fitness, muscular endurance and strength as measured by increased METs and functional capacity ( ) Short Term: Increase workloads from initial exercise prescription for resistance, speed, and METs.;Short Term: Perform resistance training exercises routinely during rehab and add in resistance training at home;Long Term: Improve cardiorespiratory fitness, muscular endurance and strength as measured by increased METs and functional capacity ( )     Able to understand and use rate of perceived exertion (RPE) scale Yes Yes Yes     Intervention Provide education and explanation on how to use RPE scale Provide education and  explanation on how to use RPE scale Provide education and explanation on how to use RPE scale     Expected Outcomes Short Term: Able to use RPE daily in rehab to express subjective intensity level;Long Term:  Able to use RPE to guide intensity level when exercising independently Short Term: Able to use RPE daily in rehab to express subjective intensity level;Long Term:  Able to use RPE to guide intensity level when exercising independently Short Term: Able to use RPE daily in rehab to express subjective intensity level;Long Term:  Able to use RPE to guide intensity level when exercising independently     Knowledge and understanding of Target Heart Rate Range (THRR) Yes Yes Yes     Intervention Provide education and explanation of THRR including how the numbers were predicted and where they are located for reference Provide education and explanation of THRR including how the numbers were predicted and where they are located for reference Provide education and explanation of THRR including how the numbers were predicted and where they are located for reference     Expected Outcomes Short Term: Able to state/look up THRR;Short Term: Able to use daily as guideline for intensity in rehab;Long Term: Able to use THRR to govern intensity when exercising independently Short Term: Able to state/look up THRR;Short Term: Able to use daily as guideline for intensity in rehab;Long Term: Able to use THRR to govern intensity when exercising independently Short Term: Able to state/look up THRR;Short Term: Able to use daily as guideline for intensity in rehab;Long Term: Able to use THRR to govern intensity when exercising independently     Able to check pulse independently Yes Yes Yes     Intervention Provide education and demonstration on how to check pulse in carotid and radial arteries.;Review the importance of being able to check your own pulse for safety during independent exercise Provide education and demonstration on  how to check pulse in carotid and radial arteries.;Review the importance of being able to check your own pulse for safety during independent exercise Provide education and demonstration on how to check pulse in carotid and radial arteries.;Review the importance of being able to check your own pulse for safety during independent exercise     Expected Outcomes Short Term: Able to explain why pulse checking is important during independent exercise;Long Term: Able to check pulse independently and accurately Short Term: Able to explain why pulse checking is important during independent exercise;Long Term: Able to check pulse independently and accurately Short Term: Able to explain why pulse checking is important during independent exercise;Long Term: Able to check pulse independently and accurately     Understanding of Exercise Prescription Yes Yes Yes     Intervention Provide education, explanation, and written materials on patient's individual exercise prescription Provide education, explanation, and written  materials on patient's individual exercise prescription Provide education, explanation, and written materials on patient's individual exercise prescription     Expected Outcomes Short Term: Able to explain program exercise prescription;Long Term: Able to explain home exercise prescription to exercise independently Short Term: Able to explain program exercise prescription;Long Term: Able to explain home exercise prescription to exercise independently Short Term: Able to explain program exercise prescription;Long Term: Able to explain home exercise prescription to exercise independently              Exercise Goals Re-Evaluation :  Exercise Goals Re-Evaluation     Row Name 10/05/22 1532 11/02/22 1450           Exercise Goal Re-Evaluation   Exercise Goals Review Increase Physical Activity;Increase Strength and Stamina;Able to understand and use rate of perceived exertion (RPE) scale;Knowledge and  understanding of Target Heart Rate Range (THRR);Able to check pulse independently;Understanding of Exercise Prescription Increase Physical Activity;Increase Strength and Stamina;Able to understand and use rate of perceived exertion (RPE) scale;Knowledge and understanding of Target Heart Rate Range (THRR);Able to check pulse independently;Understanding of Exercise Prescription      Comments Pt has completed 3 sessions of cardiac rehab. He is motivated during class and is starting to increase his SPM on the stepper. He is currently exercising at 1.99 METs on both the treadmill and stepper. Will continue to monitor and progress as able. Pt has completed 7 sessions of cardiac rehab. His attendace has been hit and miss. He was exercising at 2.30 METs on the stepper. WIll continue to monitor and progress as able.      Expected Outcomes Through exercise at rehab and home, the patient will meet thier stated goals Through exercise at rehab and home, the patient will meet thier stated goals                Discharge Exercise Prescription (Final Exercise Prescription Changes):  Exercise Prescription Changes - 10/28/22 1500       Response to Exercise   Blood Pressure (Admit) 130/80    Blood Pressure (Exercise) 138/80    Blood Pressure (Exit) 130/80    Heart Rate (Admit) 89 bpm    Heart Rate (Exercise) 93 bpm    Heart Rate (Exit) 88 bpm    Rating of Perceived Exertion (Exercise) 11    Duration Continue with 30 min of aerobic exercise without signs/symptoms of physical distress.    Intensity THRR unchanged      Progression   Progression Continue to progress workloads to maintain intensity without signs/symptoms of physical distress.      Resistance Training   Training Prescription Yes    Weight 4    Reps 10-15    Time 10 Minutes      Treadmill   MPH 1.7    Grade 0    Minutes 17    METs 2.3      NuStep   Level 2    SPM 106    Minutes 22    METs 2.22             Nutrition:   Target Goals: Understanding of nutrition guidelines, daily intake of sodium 1500mg , cholesterol 200mg , calories 30% from fat and 7% or less from saturated fats, daily to have 5 or more servings of fruits and vegetables.  Biometrics:  Pre Biometrics - 09/25/22 1429       Pre Biometrics   Height 5\' 10"  (1.778 m)    Weight 90.5 kg    Waist Circumference  39 inches    Hip Circumference 41 inches    Waist to Hip Ratio 0.95 %    BMI (Calculated) 28.63    Triceps Skinfold 10 mm    % Body Fat 24.7 %    Grip Strength 42.4 kg    Flexibility 0 in    Single Leg Stand 10.65 seconds              Nutrition Therapy Plan and Nutrition Goals:  Nutrition Therapy & Goals - 09/25/22 1403       Personal Nutrition Goals   Comments Patient scored 148 on his diet assessment. He admits to not eating healthy and knows he needs to improve. We have educational sessions on heart healthy nutrition with handouts and we offer assistance with RD referral if patient is interested.      Intervention Plan   Intervention Nutrition handout(s) given to patient.    Expected Outcomes Short Term Goal: Understand basic principles of dietary content, such as calories, fat, sodium, cholesterol and nutrients.             Nutrition Assessments:  Nutrition Assessments - 09/25/22 1402       MEDFICTS Scores   Pre Score 148            MEDIFICTS Score Key: ?70 Need to make dietary changes  40-70 Heart Healthy Diet ? 40 Therapeutic Level Cholesterol Diet   Picture Your Plate Scores: <16 Unhealthy dietary pattern with much room for improvement. 41-50 Dietary pattern unlikely to meet recommendations for good health and room for improvement. 51-60 More healthful dietary pattern, with some room for improvement.  >60 Healthy dietary pattern, although there may be some specific behaviors that could be improved.    Nutrition Goals Re-Evaluation:   Nutrition Goals Discharge (Final Nutrition Goals  Re-Evaluation):   Psychosocial: Target Goals: Acknowledge presence or absence of significant depression and/or stress, maximize coping skills, provide positive support system. Participant is able to verbalize types and ability to use techniques and skills needed for reducing stress and depression.  Initial Review & Psychosocial Screening:  Initial Psych Review & Screening - 09/25/22 1416       Initial Review   Current issues with Current Sleep Concerns;Current Psychotropic Meds      Family Dynamics   Good Support System? Yes      Barriers   Psychosocial barriers to participate in program There are no identifiable barriers or psychosocial needs.      Screening Interventions   Interventions Encouraged to exercise;To provide support and resources with identified psychosocial needs;Provide feedback about the scores to participant    Expected Outcomes Short Term goal: Utilizing psychosocial counselor, staff and physician to assist with identification of specific Stressors or current issues interfering with healing process. Setting desired goal for each stressor or current issue identified.;Short Term goal: Identification and review with participant of any Quality of Life or Depression concerns found by scoring the questionnaire.             Quality of Life Scores:  Quality of Life - 09/25/22 1431       Quality of Life   Select Quality of Life      Quality of Life Scores   Health/Function Pre 21.2 %    Socioeconomic Pre 8.69 %    Psych/Spiritual Pre 24 %    Family Pre 25.2 %    GLOBAL Pre 19.47 %            Scores of 19 and  below usually indicate a poorer quality of life in these areas.  A difference of  2-3 points is a clinically meaningful difference.  A difference of 2-3 points in the total score of the Quality of Life Index has been associated with significant improvement in overall quality of life, self-image, physical symptoms, and general health in studies assessing  change in quality of life.  PHQ-9: Review Flowsheet       09/25/2022 07/23/2017  Depression screen PHQ 2/9  Decreased Interest 0 0  Down, Depressed, Hopeless 1 1  PHQ - 2 Score 1 1  Altered sleeping 2 3  Tired, decreased energy 1 1  Change in appetite 1 1  Feeling bad or failure about yourself  1 1  Trouble concentrating 0 0  Moving slowly or fidgety/restless 0 0  Suicidal thoughts 0 0  PHQ-9 Score 6 7  Difficult doing work/chores Not difficult at all Not difficult at all   Interpretation of Total Score  Total Score Depression Severity:  1-4 = Minimal depression, 5-9 = Mild depression, 10-14 = Moderate depression, 15-19 = Moderately severe depression, 20-27 = Severe depression   Psychosocial Evaluation and Intervention:  Psychosocial Evaluation - 09/25/22 1447       Psychosocial Evaluation & Interventions   Interventions Encouraged to exercise with the program and follow exercise prescription;Stress management education;Relaxation education    Comments Patient has no psychosocial barriers identified at his orientation visit. His PHQ-9 score was 6 due to trouble sleeping; low energy; overeating; and he does admit to feeling down at times when he looks in the mirror and sees his scars and remembers all he has been through. 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 He uses melatonin and trazodone for sleep with some improvement. He denies any depression or anxiety. He has good support from his wife and his mother and his wife's parents. He has twin boys 53 months old and they keep him busy. He is still on long term disability with good year but is working on getting permanent disability. He has participated in the program in 2018 with a valve replacement. He is ready to start the program hoping to get healthier overall and be able to do his normal activities and finish his degree.    Expected Outcomes Patient will continue to  have no psychosocial barriers identified.             Psychosocial Re-Evaluation:  Psychosocial Re-Evaluation     Row Name 09/28/22 0855 10/26/22 0837           Psychosocial Re-Evaluation   Current issues with None Identified None Identified      Comments Patient is new to the program. He plans to start today 3/18. We will continue to monitor his progress in the program. Patient has completed 6 sessions. He continues to have no psychosocial barriers identified. He enjoys the program and demonstrates an interest in improving his health. We will continue to monitor his progress in the program.      Expected Outcomes Patient will continue to have no psychosocial barriers identified. Patient will continue to have no psychosocial barriers identified.      Interventions Stress management education;Encouraged to attend Cardiac Rehabilitation for the exercise;Relaxation education Stress management education;Encouraged to attend Cardiac Rehabilitation for the exercise;Relaxation education      Continue Psychosocial Services  No Follow up required No Follow up required  Psychosocial Discharge (Final Psychosocial Re-Evaluation):  Psychosocial Re-Evaluation - 10/26/22 1610       Psychosocial Re-Evaluation   Current issues with None Identified    Comments Patient has completed 6 sessions. He continues to have no psychosocial barriers identified. He enjoys the program and demonstrates an interest in improving his health. We will continue to monitor his progress in the program.    Expected Outcomes Patient will continue to have no psychosocial barriers identified.    Interventions Stress management education;Encouraged to attend Cardiac Rehabilitation for the exercise;Relaxation education    Continue Psychosocial Services  No Follow up required             Vocational Rehabilitation: Provide vocational rehab assistance to qualifying candidates.   Vocational Rehab  Evaluation & Intervention:  Vocational Rehab - 09/25/22 1405       Initial Vocational Rehab Evaluation & Intervention   Assessment shows need for Vocational Rehabilitation No      Vocational Rehab Re-Evaulation   Comments Patient is out of work on long term disability and is working on getting permanent disability.             Education: Education Goals: Education classes will be provided on a weekly basis, covering required topics. Participant will state understanding/return demonstration of topics presented.  Learning Barriers/Preferences:  Learning Barriers/Preferences - 09/25/22 1404       Learning Barriers/Preferences   Learning Barriers None    Learning Preferences Skilled Demonstration;Audio             Education Topics: Hypertension, Hypertension Reduction -Define heart disease and high blood pressure. Discus how high blood pressure affects the body and ways to reduce high blood pressure.   Exercise and Your Heart -Discuss why it is important to exercise, the FITT principles of exercise, normal and abnormal responses to exercise, and how to exercise safely. Flowsheet Row CARDIAC REHAB PHASE II EXERCISE from 10/28/2022 in [place] CARDIAC REHABILITATION  Date 10/28/22  Educator HB  Instruction Review Code 2- Demonstrated Understanding       Angina -Discuss definition of angina, causes of angina, treatment of angina, and how to decrease risk of having angina. Flowsheet Row CARDIAC REHAB PHASE II EXERCISE from 09/08/2017 in [place] C[facility]AC [facility]HAB[facility]  Date 09/01/17  Educator DJ  Instruction Review Code 2- Demonstrated Understanding       Cardiac Medications -Review what the following cardiac medications are used for, how they affect the body, and side effects that may occur when taking the medications.  Medications include Aspirin, Beta blockers, calcium channel blockers, ACE Inhibitors, angiotensin receptor blockers, diuretics, digoxin, and  antihyperlipidemics. Flowsheet Row CARDIAC REHAB PHASE II EXERCISE from 09/08/2017 in [place] C[facility]AC [facility]HAB[facility]IT[facility]  Date 09/08/17  Educator DJ  Instruction Review Code 2- Demonstrated Understanding       Congestive Heart Failure -Discuss the definition of CHF, how to live with CHF, the signs and symptoms of CHF, and how keep track of weight and sodium intake.   Heart Disease and Intimacy -Discus the effect sexual activity has on the heart, how changes occur during intimacy as we age, and safety during sexual activity.   Smoking Cessation / COPD -Discuss different methods to quit smoking, the health benefits of quitting smoking, and the definition of COPD.   Nutrition I: Fats -Discuss the types of cholesterol, what cholesterol does to the heart, and how cholesterol levels can be controlled.   Nutrition II: Labels -Discuss the different components of food labels and how to  read food label   Heart Parts/Heart Disease and PAD -Discuss the anatomy of the heart, the pathway of blood circulation through the heart, and these are affected by heart disease.   Stress I: Signs and Symptoms -Discuss the causes of stress, how stress may lead to anxiety and depression, and ways to limit stress. Flowsheet Row CARDIAC REHAB PHASE II EXERCISE from 09/08/2017 in [place] C[facility]IAC REH[facility]  Date 07/28/17  Educator DJ  Instruction Review Code 2- Demonstrated Understanding       Stress II: Relaxation -Discuss different types of relaxation techniques to limit stress. Flowsheet Row CARDIAC REHAB PHASE II EXERCISE from 09/08/2017 in [place] CARDIAC REHAB[facility]  Date 08/04/17  Educator DC  Instruction Review Code 2- Demonstrated Understanding       Warning Signs of Stroke / TIA -Discuss definition of a stroke, what the signs and symptoms are of a stroke, and how to identify when someone is having stroke. Flowsheet Row CARDIAC REHAB PHASE II EXERCISE from 09/08/2017 in [place] [facility]  Date 08/11/17  Educator DC  Instruction Review Code 2- Demonstrated Understanding       Knowledge Questionnaire Score:  Knowledge Questionnaire Score - 09/25/22 1405       Knowledge Questionnaire Score   Pre Score 20/24             Core Components/Risk Factors/Patient Goals at Admission:  Personal Goals and Risk Factors at Admission - 09/25/22 1413       Core Components/Risk Factors/Patient Goals on Admission    Weight Management Obesity    Improve shortness of breath with ADL's Yes    Intervention Provide education, individualized exercise plan and daily activity instruction to help decrease symptoms of SOB with activities of daily living.    Expected Outcomes Short Term: Improve cardiorespiratory fitness to achieve a reduction of symptoms when performing ADLs;Long Term: Be able to perform more ADLs without symptoms or delay the onset of symptoms    Hypertension Yes    Intervention Provide education on lifestyle modifcations including regular physical activity/exercise, weight management, moderate sodium restriction and increased consumption of fresh fruit, vegetables, and low fat dairy, alcohol moderation, and smoking cessation.;Monitor prescription use compliance.    Expected Outcomes Short Term: Continued assessment and intervention until BP is < 140/64mm HG in hypertensive participants. < 130/80mm HG in hypertensive participants with diabetes, heart failure or chronic kidney disease.;Long Term: Maintenance of blood pressure at goal levels.    Personal Goal Other Yes    Personal Goal Patient wants to get healthier overall; be able to return to his normal activities; and be able to go back to [name] and finish his degress in sports medicine.    Intervention Patient will attend [facility] 3 days/week with exercise and education.    Expected Outcomes Patient will complete the program meeting both personal and program goals.             Core  Components/Risk Factors/Patient Goals Review:   Goals and Risk Factor Review     Row Name 09/28/22 0855 10/26/22 0838           Core Components/Risk Factors/Patient Goals Review   Personal Goals Review Weight Management/Obesity;Hypertension;Other;Improve shortness of breath with ADL's Weight Management/Obesity;Hypertension;Other;Improve shortness of breath with ADL's      Review Patient was referred to [facility] with s/p heart transplant. He has multiple risk factors for CAD and is participating in the program for risk modification. He plans to start the program today 3/18. He  is currently taking Metformin but does not have a diagnosis of DM. He says he is taking the medication because of other medications he is taking. 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 We will continue to monitor his progress as he works towards meeting these goals. Patient has completed 6 sessions. His current weight is 203.6 gaining 4.6 lbs since last 30 day review. He is doing well in the program with progressions. He had to miss several sessions due to a GI virus that he and his family had.  He continues to be followed by the transplant team. His blood pressures have been labile but mostly at goal.  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 We will continue to monitor his progress as he works towards meeting these goals.      Expected Outcomes Patient will complete the program meeting both personal and program goals. Patient will complete the program meeting both personal and program goals.               Core Components/Risk Factors/Patient Goals at Discharge (Final Review):   Goals and Risk Factor Review - 10/26/22 0838       Core Components/Risk Factors/Patient  Goals Review   Personal Goals Review Weight Management/Obesity;Hypertension;Other;Improve shortness of breath with ADL's    Review Patient has completed 6 sessions. His current weight is 203.6 gaining 4.6 lbs since last 30 day review. He is doing well in the program with progressions. He had to miss several sessions due to a GI virus that he and his family had.  He continues to be followed by the transplant team. His blood pressures have been labile but mostly at goal.  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 We will continue to monitor his progress as he works towards meeting these goals.    Expected Outcomes Patient will complete the program meeting both personal and program goals.             ITP Comments:   Comments: ITP REVIEW Pt is making expected progress toward Cardiac Rehab goals after completing 7 sessions. Recommend continued exercise, life style modification, education, and increased stamina and strength.

## 2022-11-06 ENCOUNTER — Encounter (HOSPITAL_COMMUNITY): Payer: BC Managed Care – PPO

## 2022-11-09 ENCOUNTER — Encounter (HOSPITAL_COMMUNITY): Admission: RE | Admit: 2022-11-09 | Payer: BC Managed Care – PPO | Source: Ambulatory Visit

## 2022-11-10 DIAGNOSIS — N183 Chronic kidney disease, stage 3 unspecified: Secondary | ICD-10-CM | POA: Diagnosis not present

## 2022-11-10 DIAGNOSIS — Z941 Heart transplant status: Secondary | ICD-10-CM | POA: Diagnosis not present

## 2022-11-10 DIAGNOSIS — Z4821 Encounter for aftercare following heart transplant: Secondary | ICD-10-CM | POA: Diagnosis not present

## 2022-11-10 DIAGNOSIS — Z2989 Encounter for other specified prophylactic measures: Secondary | ICD-10-CM | POA: Diagnosis not present

## 2022-11-10 DIAGNOSIS — N1832 Chronic kidney disease, stage 3b: Secondary | ICD-10-CM | POA: Diagnosis not present

## 2022-11-10 DIAGNOSIS — Z79621 Long term (current) use of calcineurin inhibitor: Secondary | ICD-10-CM | POA: Diagnosis not present

## 2022-11-10 DIAGNOSIS — Z79624 Long term (current) use of inhibitors of nucleotide synthesis: Secondary | ICD-10-CM | POA: Diagnosis not present

## 2022-11-10 DIAGNOSIS — I371 Nonrheumatic pulmonary valve insufficiency: Secondary | ICD-10-CM | POA: Diagnosis not present

## 2022-11-10 DIAGNOSIS — Z79899 Other long term (current) drug therapy: Secondary | ICD-10-CM | POA: Diagnosis not present

## 2022-11-10 DIAGNOSIS — E1122 Type 2 diabetes mellitus with diabetic chronic kidney disease: Secondary | ICD-10-CM | POA: Diagnosis not present

## 2022-11-10 DIAGNOSIS — I129 Hypertensive chronic kidney disease with stage 1 through stage 4 chronic kidney disease, or unspecified chronic kidney disease: Secondary | ICD-10-CM | POA: Diagnosis not present

## 2022-11-10 DIAGNOSIS — Z48298 Encounter for aftercare following other organ transplant: Secondary | ICD-10-CM | POA: Diagnosis not present

## 2022-11-11 ENCOUNTER — Encounter (HOSPITAL_COMMUNITY): Payer: BC Managed Care – PPO | Attending: Internal Medicine

## 2022-11-11 DIAGNOSIS — Z941 Heart transplant status: Secondary | ICD-10-CM | POA: Insufficient documentation

## 2022-11-13 ENCOUNTER — Encounter (HOSPITAL_COMMUNITY): Payer: BC Managed Care – PPO

## 2022-11-16 ENCOUNTER — Encounter (HOSPITAL_COMMUNITY): Payer: BC Managed Care – PPO

## 2022-11-18 ENCOUNTER — Encounter (HOSPITAL_COMMUNITY): Payer: BC Managed Care – PPO

## 2022-11-20 ENCOUNTER — Encounter (HOSPITAL_COMMUNITY): Payer: BC Managed Care – PPO

## 2022-11-23 ENCOUNTER — Encounter (HOSPITAL_COMMUNITY): Payer: BC Managed Care – PPO

## 2022-11-25 ENCOUNTER — Encounter (HOSPITAL_COMMUNITY): Payer: BC Managed Care – PPO

## 2022-11-26 NOTE — Progress Notes (Signed)
Discharge Progress Report  Patient Details  Name: [name] [name] MRN: [id number] Date of Birth: 1981/04/04 Referring Provider:   Flowsheet Row CARDIAC REHAB PHASE II ORIENTATION from 09/25/2022 in [facility] CARDIAC REHABILITATION  Referring Provider Dr. Gala Romney        Number of Visits: 7  Reason for Discharge:  Early Exit:  Lack of attendance  Smoking History:  Social History   Tobacco Use  Smoking Status Never  Smokeless Tobacco Never    Diagnosis:  Status post heart transplant (HCC)  ADL [facility]:   Initial Exercise Prescription:  Initial Exercise Prescription - 09/25/22 1400       Date of Initial Exercise RX and Referring Provider   Date 09/25/22    Referring Provider Dr. Gala Romney    Expected Discharge Date 12/16/22      Treadmill   MPH 1    Grade 0    Minutes 17      NuStep   Level 1    SPM 80    Minutes 22      Prescription Details   Frequency (times per week) 3    Duration Progress to 30 minutes of continuous aerobic without signs/symptoms of physical distress      Intensity   THRR 40-80% of Max Heartrate 72-143    Ratings of Perceived Exertion 11-13      Resistance Training   Training Prescription Yes    Weight 4    Reps 10-15             Discharge Exercise Prescription (Final Exercise Prescription Changes):  Exercise Prescription Changes - 10/28/22 1500       Response to Exercise   Blood Pressure (Admit) 130/80    Blood Pressure (Exercise) 138/80    Blood Pressure (Exit) 130/80    Heart Rate (Admit) 89 bpm    Heart Rate (Exercise) 93 bpm    Heart Rate (Exit) 88 bpm    Rating of Perceived Exertion (Exercise) 11    Duration Continue with 30 min of aerobic exercise without signs/symptoms of physical distress.    Intensity THRR unchanged      Progression   Progression Continue to progress workloads to maintain intensity without signs/symptoms of physical distress.      Resistance Training   Training Prescription Yes     Weight 4    Reps 10-15    Time 10 Minutes      Treadmill   MPH 1.7    Grade 0    Minutes 17    METs 2.3      NuStep   Level 2    SPM 106    Minutes 22    METs 2.22             Functional Capacity:  6 Minute Walk     Row Name 09/25/22 1425         6 Minute Walk   Phase Initial     Distance 1300 feet     Walk Time 6 minutes     # of Rest Breaks 0     MPH 2.46     METS 4.37     RPE 12     VO2 Peak 15.3     Symptoms No     Resting HR 75 bpm     Resting BP 120/80     Resting Oxygen Saturation  98 %     Exercise Oxygen Saturation  during 6 min walk 98 %  Max Ex. HR 85 bpm     Max Ex. BP 140/80     2 Minute Post BP 122/80              Psychological, QOL, Others - Outcomes: PHQ 2/9:    09/25/2022    2:00 PM 07/23/2017    2:17 PM  Depression screen PHQ 2/9  Decreased Interest 0 0  Down, Depressed, Hopeless 1 1  PHQ - 2 Score 1 1  Altered sleeping 2 3  Tired, decreased energy 1 1  Change in appetite 1 1  Feeling bad or failure about yourself  1 1  Trouble concentrating 0 0  Moving slowly or fidgety/restless 0 0  Suicidal thoughts 0 0  PHQ-9 Score 6 7  Difficult doing work/chores Not difficult at all Not difficult at all    Quality of Life:  Quality of Life - 09/25/22 1431       Quality of Life   Select Quality of Life      Quality of Life Scores   Health/Function Pre 21.2 %    Socioeconomic Pre 8.69 %    Psych/Spiritual Pre 24 %    Family Pre 25.2 %    GLOBAL Pre 19.47 %             Personal Goals: Goals established at orientation with interventions provided to work toward goal.  Personal Goals and Risk Factors at Admission - 09/25/22 1413       Core Components/Risk Factors/Patient Goals on Admission    Weight Management Obesity    Improve shortness of breath with ADL's Yes    Intervention Provide education, individualized exercise plan and daily activity instruction to help decrease symptoms of SOB with activities of daily  living.    Expected Outcomes Short Term: Improve cardiorespiratory fitness to achieve a reduction of symptoms when performing ADLs;Long Term: Be able to perform more ADLs without symptoms or delay the onset of symptoms    Hypertension Yes    Intervention Provide education on lifestyle modifcations including regular physical activity/exercise, weight management, moderate sodium restriction and increased consumption of fresh fruit, vegetables, and low fat dairy, alcohol moderation, and smoking cessation.;Monitor prescription use compliance.    Expected Outcomes Short Term: Continued assessment and intervention until BP is < 140/66mm HG in hypertensive participants. < 130/53mm HG in hypertensive participants with diabetes, heart failure or chronic kidney disease.;Long Term: Maintenance of blood pressure at goal levels.    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    Intervention Patient will attend CR 3 days/week with exercise and education.    Expected Outcomes Patient will complete the program meeting both personal and program goals.              Personal Goals Discharge:  Goals and Risk Factor Review     Row Name 09/28/22 0855 10/26/22 0838           Core Components/Risk Factors/Patient Goals Review   Personal Goals Review Weight Management/Obesity;Hypertension;Other;Improve shortness of breath with ADL's Weight Management/Obesity;Hypertension;Other;Improve shortness of breath with ADL's      Review Patient was referred to CR with s/p heart transplant. He has multiple risk factors for CAD and is participating in the program for risk modification. He plans to start the program today 3/18. He is currently taking Metformin  but does not have a diagnosis of DM. He says he is taking the medication because of other medications he is taking.  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 We will continue to monitor his progress as he works towards meeting these goals. Patient has completed 6 sessions. His current weight is 203.6 gaining 4.6 lbs since last 30 day review. He is doing well in the program with progressions. He had to miss several sessions due to a GI virus that he and his family had.  He continues to be followed by the transplant team. His blood pressures have been labile but mostly at goal.  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 We will continue to monitor his progress as he works towards meeting these goals.      Expected Outcomes Patient will complete the program meeting both personal and program goals. Patient will complete the program meeting both personal and program goals.               Exercise Goals and Review:  Exercise Goals     Row Name 09/25/22 1428 10/05/22 1532 11/02/22 1450         Exercise Goals   Increase Physical Activity Yes Yes Yes     Intervention Provide advice, education, support and counseling about physical activity/exercise needs.;Develop an individualized exercise prescription for aerobic and resistive training based on initial evaluation findings, risk stratification, comorbidities and participant's personal goals. Provide advice, education, support and counseling about physical activity/exercise needs.;Develop an individualized exercise prescription for aerobic and resistive training based on initial evaluation findings, risk stratification, comorbidities and participant's personal goals. Provide advice, education, support and counseling about physical activity/exercise needs.;Develop an individualized exercise prescription for aerobic and  resistive training based on initial evaluation findings, risk stratification, comorbidities and participant's personal goals.     Expected Outcomes Short Term: Attend rehab on a regular basis to increase amount of physical activity.;Long Term: Add in home exercise to make exercise part of routine and to increase amount of physical activity.;Long Term: Exercising regularly at least 3-5 days a week. Short Term: Attend rehab on a regular basis to increase amount of physical activity.;Long Term: Add in home exercise to make exercise part of routine and to increase amount of physical activity.;Long Term: Exercising regularly at least 3-5 days a week. Short Term: Attend rehab on a regular basis to increase amount of physical activity.;Long Term: Add in home exercise to make exercise part of routine and to increase amount of physical activity.;Long Term: Exercising regularly at least 3-5 days a week.     Increase Strength and Stamina Yes Yes Yes     Intervention Provide advice, education, support and counseling about physical activity/exercise needs.;Develop an individualized exercise prescription for aerobic and resistive training based on initial evaluation findings, risk stratification, comorbidities and participant's personal goals. Provide advice, education, support and counseling about physical activity/exercise needs.;Develop an individualized exercise prescription for aerobic and resistive training based on initial evaluation findings, risk stratification, comorbidities and participant's personal goals. Provide advice, education, support and counseling about physical activity/exercise needs.;Develop an individualized exercise prescription for aerobic and resistive training based on initial evaluation findings, risk stratification, comorbidities and participant's personal goals.  Expected Outcomes Short Term: Increase workloads from initial exercise prescription for resistance, speed, and METs.;Short Term:  Perform resistance training exercises routinely during rehab and add in resistance training at home;Long Term: Improve cardiorespiratory fitness, muscular endurance and strength as measured by increased METs and functional capacity ( ) Short Term: Increase workloads from initial exercise prescription for resistance, speed, and METs.;Short Term: Perform resistance training exercises routinely during rehab and add in resistance training at home;Long Term: Improve cardiorespiratory fitness, muscular endurance and strength as measured by increased METs and functional capacity ( ) Short Term: Increase workloads from initial exercise prescription for resistance, speed, and METs.;Short Term: Perform resistance training exercises routinely during rehab and add in resistance training at home;Long Term: Improve cardiorespiratory fitness, muscular endurance and strength as measured by increased METs and functional capacity ( )     Able to understand and use rate of perceived exertion (RPE) scale Yes Yes Yes     Intervention Provide education and explanation on how to use RPE scale Provide education and explanation on how to use RPE scale Provide education and explanation on how to use RPE scale     Expected Outcomes Short Term: Able to use RPE daily in rehab to express subjective intensity level;Long Term:  Able to use RPE to guide intensity level when exercising independently Short Term: Able to use RPE daily in rehab to express subjective intensity level;Long Term:  Able to use RPE to guide intensity level when exercising independently Short Term: Able to use RPE daily in rehab to express subjective intensity level;Long Term:  Able to use RPE to guide intensity level when exercising independently     Knowledge and understanding of Target Heart Rate Range (THRR) Yes Yes Yes     Intervention Provide education and explanation of THRR including how the numbers were predicted and where they are located for reference  Provide education and explanation of THRR including how the numbers were predicted and where they are located for reference Provide education and explanation of THRR including how the numbers were predicted and where they are located for reference     Expected Outcomes Short Term: Able to state/look up THRR;Short Term: Able to use daily as guideline for intensity in rehab;Long Term: Able to use THRR to govern intensity when exercising independently Short Term: Able to state/look up THRR;Short Term: Able to use daily as guideline for intensity in rehab;Long Term: Able to use THRR to govern intensity when exercising independently Short Term: Able to state/look up THRR;Short Term: Able to use daily as guideline for intensity in rehab;Long Term: Able to use THRR to govern intensity when exercising independently     Able to check pulse independently Yes Yes Yes     Intervention Provide education and demonstration on how to check pulse in carotid and radial arteries.;Review the importance of being able to check your own pulse for safety during independent exercise Provide education and demonstration on how to check pulse in carotid and radial arteries.;Review the importance of being able to check your own pulse for safety during independent exercise Provide education and demonstration on how to check pulse in carotid and radial arteries.;Review the importance of being able to check your own pulse for safety during independent exercise     Expected Outcomes Short Term: Able to explain why pulse checking is important during independent exercise;Long Term: Able to check pulse independently and accurately Short Term: Able to explain why pulse checking is important during independent exercise;Long Term: Able to check pulse independently  and accurately Short Term: Able to explain why pulse checking is important during independent exercise;Long Term: Able to check pulse independently and accurately     Understanding of  Exercise Prescription Yes Yes Yes     Intervention Provide education, explanation, and written materials on patient's individual exercise prescription Provide education, explanation, and written materials on patient's individual exercise prescription Provide education, explanation, and written materials on patient's individual exercise prescription     Expected Outcomes Short Term: Able to explain program exercise prescription;Long Term: Able to explain home exercise prescription to exercise independently Short Term: Able to explain program exercise prescription;Long Term: Able to explain home exercise prescription to exercise independently Short Term: Able to explain program exercise prescription;Long Term: Able to explain home exercise prescription to exercise independently              Exercise Goals Re-Evaluation:  Exercise Goals Re-Evaluation     Row Name 10/05/22 1532 11/02/22 1450           Exercise Goal Re-Evaluation   Exercise Goals Review Increase Physical Activity;Increase Strength and Stamina;Able to understand and use rate of perceived exertion (RPE) scale;Knowledge and understanding of Target Heart Rate Range (THRR);Able to check pulse independently;Understanding of Exercise Prescription Increase Physical Activity;Increase Strength and Stamina;Able to understand and use rate of perceived exertion (RPE) scale;Knowledge and understanding of Target Heart Rate Range (THRR);Able to check pulse independently;Understanding of Exercise Prescription      Comments Pt has completed 3 sessions of cardiac rehab. He is motivated during class and is starting to increase his SPM on the stepper. He is currently exercising at 1.99 METs on both the treadmill and stepper. Will continue to monitor and progress as able. Pt has completed 7 sessions of cardiac rehab. His attendace has been hit and miss. He was exercising at 2.30 METs on the stepper. WIll continue to monitor and progress as able.       Expected Outcomes Through exercise at rehab and home, the patient will meet thier stated goals Through exercise at rehab and home, the patient will meet thier stated goals               Nutrition & Weight - Outcomes:  Pre Biometrics - 09/25/22 1429       Pre Biometrics   Height 5\' 10"  (1.778 m)    Weight 199 lb 8.3 oz (90.5 kg)    Waist Circumference 39 inches    Hip Circumference 41 inches    Waist to Hip Ratio 0.95 %    BMI (Calculated) 28.63    Triceps Skinfold 10 mm    % Body Fat 24.7 %    Grip Strength 42.4 kg    Flexibility 0 in    Single Leg Stand 10.65 seconds              Nutrition:  Nutrition Therapy & Goals - 09/25/22 1403       Personal Nutrition Goals   Comments Patient scored 148 on his diet assessment. He admits to not eating healthy and knows he needs to improve. We have educational sessions on heart healthy nutrition with handouts and we offer assistance with RD referral if patient is interested.      Intervention Plan   Intervention Nutrition handout(s) given to patient.    Expected Outcomes Short Term Goal: Understand basic principles of dietary content, such as calories, fat, sodium, cholesterol and nutrients.             Nutrition Discharge:  Nutrition Assessments - 09/25/22 1402       MEDFICTS Scores   Pre Score 148             Education Questionnaire Score:  Knowledge Questionnaire Score - 09/25/22 1405       Knowledge Questionnaire Score   Pre Score 20/24             Pt discharged after 7 sessions of cardiac rehab. Pt stopped attending for unknown reasons. He was mailed a letter informing him of his discharge.

## 2022-11-26 NOTE — Addendum Note (Signed)
Encounter addended by: Alisia Ferrari on: 11/26/2022 9:19 AM  Actions taken: Episode resolved, Flowsheet accepted, Clinical Note Signed

## 2022-11-27 ENCOUNTER — Encounter (HOSPITAL_COMMUNITY): Payer: BC Managed Care – PPO

## 2022-11-30 ENCOUNTER — Encounter (HOSPITAL_COMMUNITY): Payer: BC Managed Care – PPO

## 2022-12-02 ENCOUNTER — Encounter (HOSPITAL_COMMUNITY): Payer: BC Managed Care – PPO

## 2022-12-04 ENCOUNTER — Encounter (HOSPITAL_COMMUNITY): Payer: BC Managed Care – PPO

## 2022-12-07 ENCOUNTER — Encounter (HOSPITAL_COMMUNITY): Payer: BC Managed Care – PPO

## 2022-12-08 DIAGNOSIS — Z79899 Other long term (current) drug therapy: Secondary | ICD-10-CM | POA: Diagnosis not present

## 2022-12-08 DIAGNOSIS — E1122 Type 2 diabetes mellitus with diabetic chronic kidney disease: Secondary | ICD-10-CM | POA: Diagnosis not present

## 2022-12-08 DIAGNOSIS — Z2989 Encounter for other specified prophylactic measures: Secondary | ICD-10-CM | POA: Diagnosis not present

## 2022-12-08 DIAGNOSIS — N1832 Chronic kidney disease, stage 3b: Secondary | ICD-10-CM | POA: Diagnosis not present

## 2022-12-08 DIAGNOSIS — I371 Nonrheumatic pulmonary valve insufficiency: Secondary | ICD-10-CM | POA: Diagnosis not present

## 2022-12-08 DIAGNOSIS — I130 Hypertensive heart and chronic kidney disease with heart failure and stage 1 through stage 4 chronic kidney disease, or unspecified chronic kidney disease: Secondary | ICD-10-CM | POA: Diagnosis not present

## 2022-12-08 DIAGNOSIS — Z4821 Encounter for aftercare following heart transplant: Secondary | ICD-10-CM | POA: Diagnosis not present

## 2022-12-08 DIAGNOSIS — I1310 Hypertensive heart and chronic kidney disease without heart failure, with stage 1 through stage 4 chronic kidney disease, or unspecified chronic kidney disease: Secondary | ICD-10-CM | POA: Diagnosis not present

## 2022-12-08 DIAGNOSIS — Z48298 Encounter for aftercare following other organ transplant: Secondary | ICD-10-CM | POA: Diagnosis not present

## 2022-12-08 DIAGNOSIS — Z941 Heart transplant status: Secondary | ICD-10-CM | POA: Diagnosis not present

## 2022-12-08 DIAGNOSIS — I5022 Chronic systolic (congestive) heart failure: Secondary | ICD-10-CM | POA: Diagnosis not present

## 2022-12-09 ENCOUNTER — Encounter (HOSPITAL_COMMUNITY): Payer: BC Managed Care – PPO

## 2022-12-11 ENCOUNTER — Encounter (HOSPITAL_COMMUNITY): Payer: BC Managed Care – PPO

## 2022-12-14 ENCOUNTER — Encounter (HOSPITAL_COMMUNITY): Payer: BC Managed Care – PPO

## 2022-12-16 ENCOUNTER — Encounter (HOSPITAL_COMMUNITY): Payer: BC Managed Care – PPO

## 2023-01-21 DIAGNOSIS — D509 Iron deficiency anemia, unspecified: Secondary | ICD-10-CM | POA: Diagnosis not present

## 2023-01-21 DIAGNOSIS — Z2989 Encounter for other specified prophylactic measures: Secondary | ICD-10-CM | POA: Diagnosis not present

## 2023-01-21 DIAGNOSIS — Z79899 Other long term (current) drug therapy: Secondary | ICD-10-CM | POA: Diagnosis not present

## 2023-01-21 DIAGNOSIS — Z941 Heart transplant status: Secondary | ICD-10-CM | POA: Diagnosis not present

## 2023-01-21 DIAGNOSIS — Z48298 Encounter for aftercare following other organ transplant: Secondary | ICD-10-CM | POA: Diagnosis not present

## 2023-02-01 IMAGING — CT CT ANGIO CHEST
2 of 7 series · 17 of 46 positions shown · IV contrast (APPLIED)
Comparison: October 02, 2020 chest radiograph CT angiogram chest May

CLINICAL DATA: Chest pain and shortness of breath. Elevated D-dimer

EXAM:
CT ANGIOGRAPHY CHEST WITH CONTRAST
TECHNIQUE: Multidetector CT imaging of the chest was performed using the
standard protocol during bolus administration of intravenous
contrast. Multiplanar CT image reconstructions and MIPs were
obtained to evaluate the vascular anatomy.
CONTRAST:  75mL OMNIPAQUE IOHEXOL 350 MG/ML SOLN

[Series 6: thins · axial · 0.76mm/px · z∈[+1183,+1456]mm · 14 of 440 slices shown]
[im 25/440  lung]
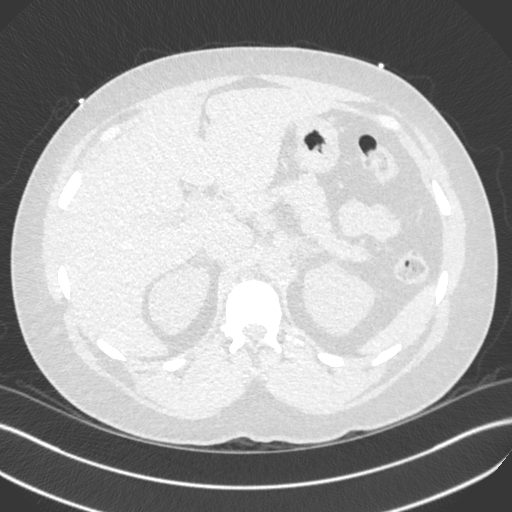
[im 49/440  soft-tissue]
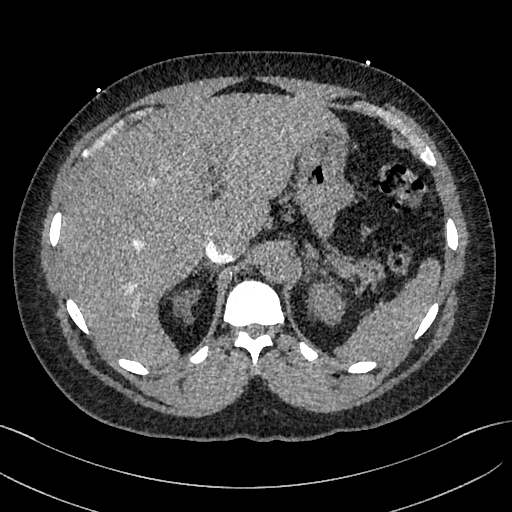
[im 98/440  lung]
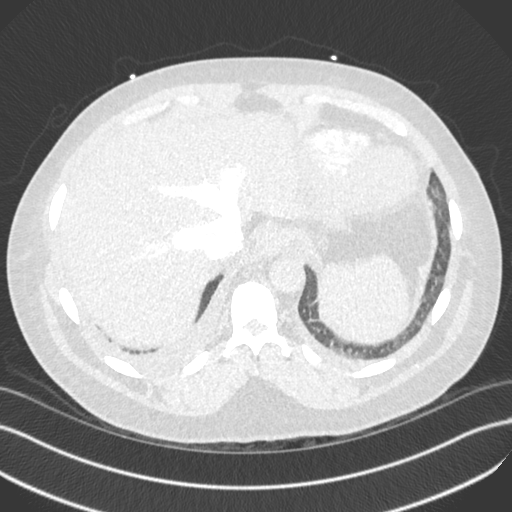
[im 122/440  soft-tissue]
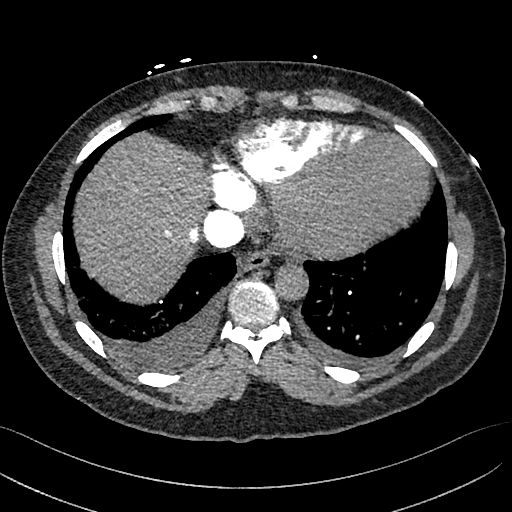
[im 147/440  lung]
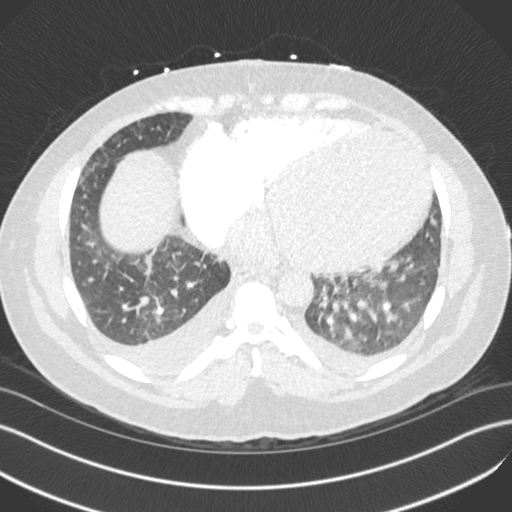
[im 171/440  soft-tissue]
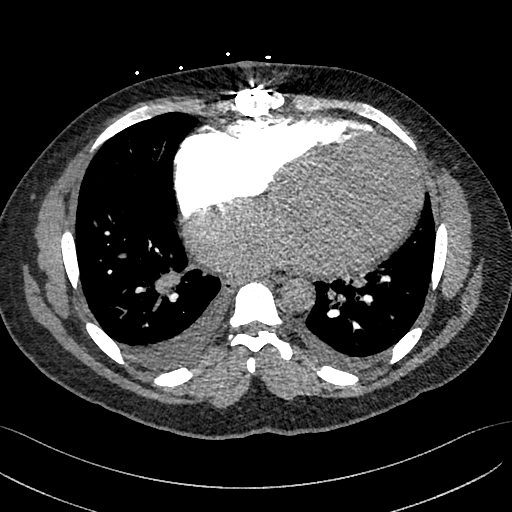
[im 196/440  lung]
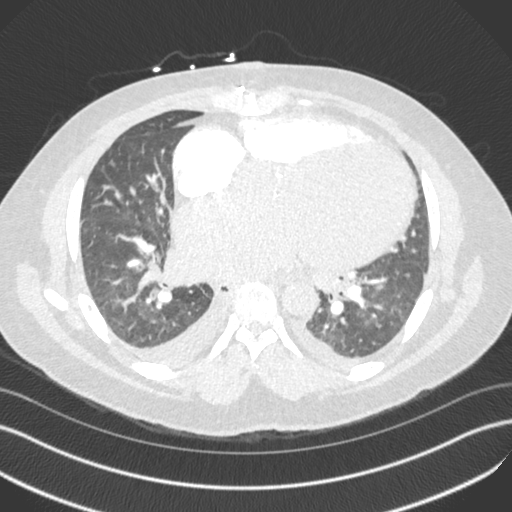
[im 244/440  soft-tissue]
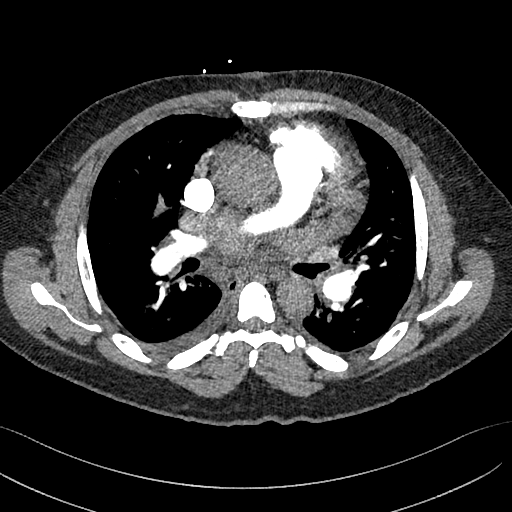
[im 269/440  lung]
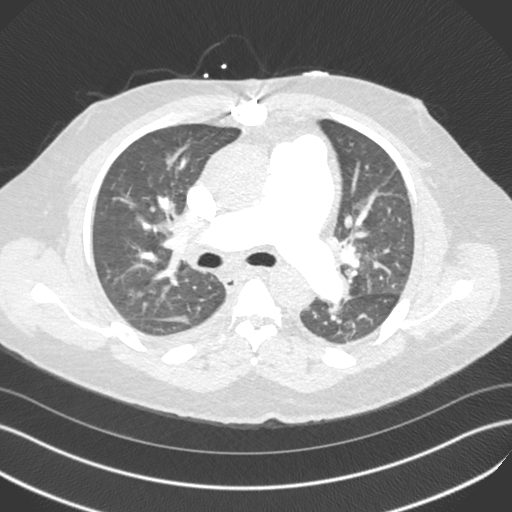
[im 293/440  soft-tissue]
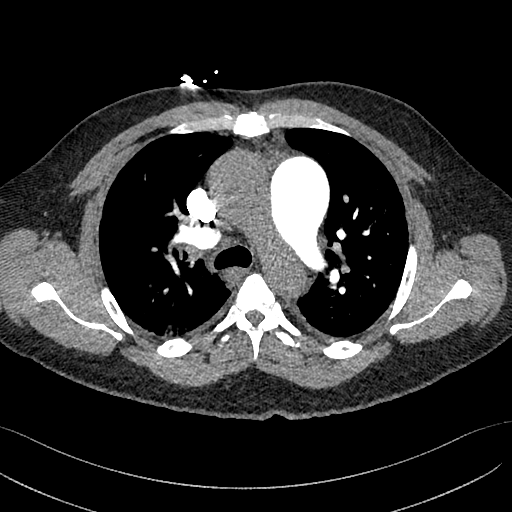
[im 318/440  lung]
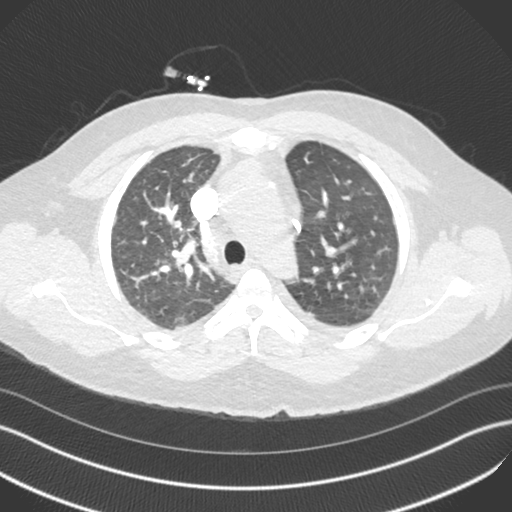
[im 342/440  soft-tissue]
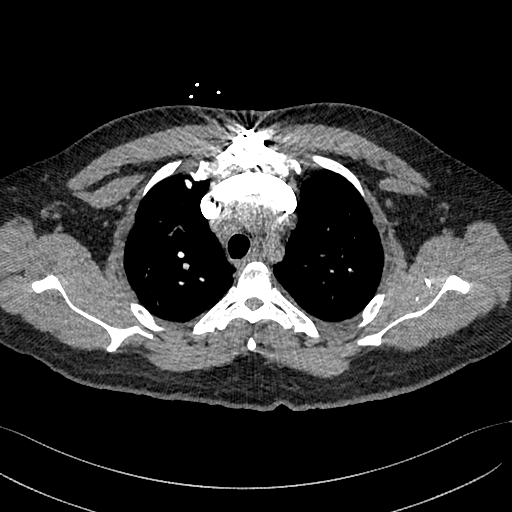
[im 391/440  lung]
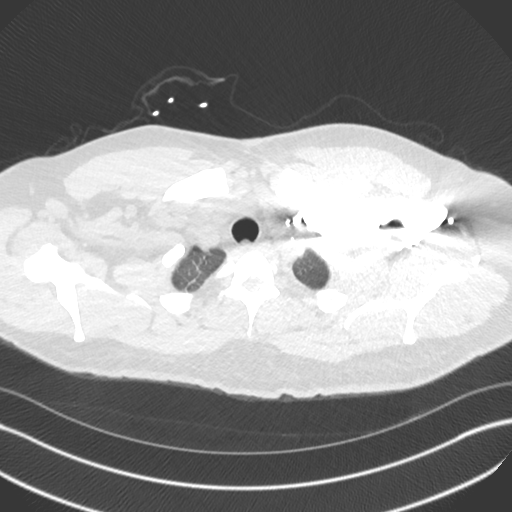
[im 415/440  soft-tissue]
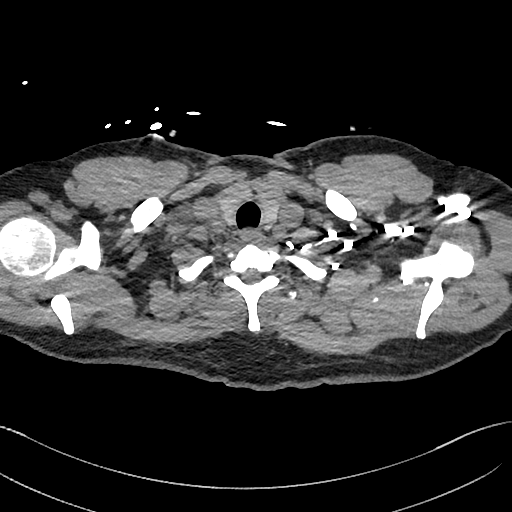

[Series 8: cor · coronal · 0.65mm/px · 3 of 125 slices shown]
[im 32/125  soft-tissue]
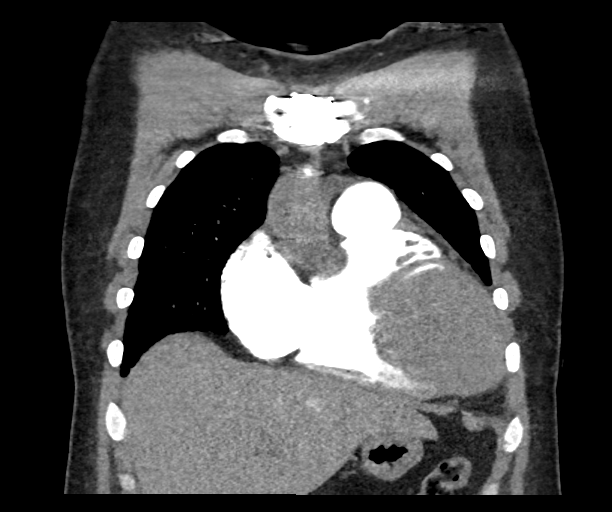
[im 63/125  soft-tissue]
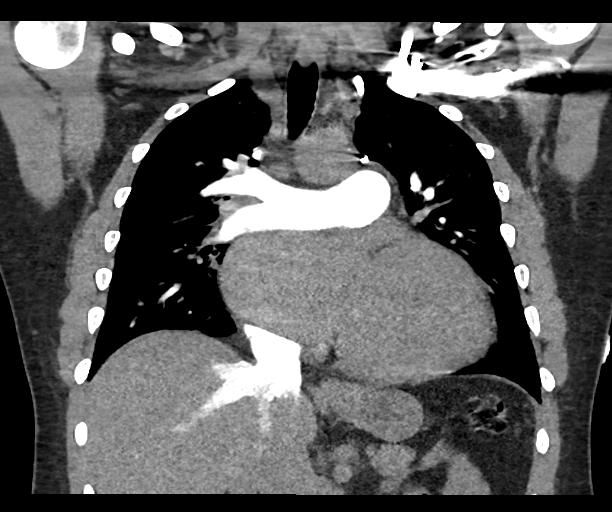
[im 94/125  soft-tissue]
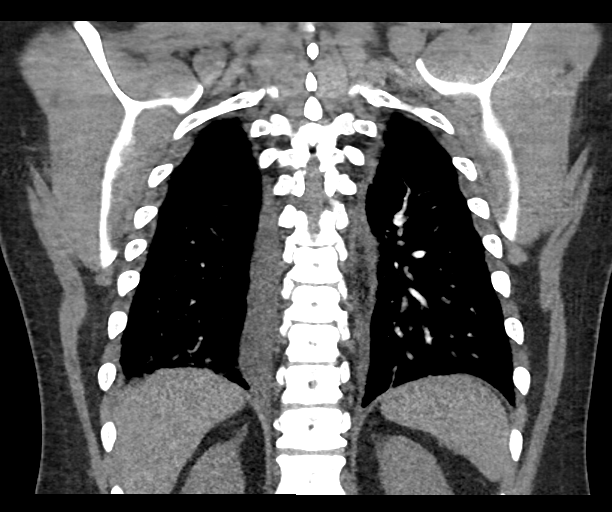

[17 of 46 positions shown; findings below may reference images not displayed]

FINDINGS: Cardiovascular: There is no appreciable pulmonary embolus. The
patient is status post aortic valve replacement. There is dilatation
of the ascending thoracic aorta with measured diameter of 4.9 x
cm. Measured diameter at the level of the sinuses of Valsalva is
cm. No dissection evident; the contrast bolus in the aorta is not
sufficient for dissection assessment. Scattered foci of
calcification are noted in visualized great vessels. There is
generalized cardiac enlargement. No pericardial effusion or
pericardial thickening.

The main pulmonary outflow tract measures 4.9 cm, dilated in
consistent with pulmonary arterial hypertension.

Mediastinum/Nodes: Visualized thyroid appears normal. There are
scattered subcentimeter mediastinal lymph nodes. No evident
adenopathy by size criteria. No evident esophageal lesions.

Lungs/Pleura: There are pleural effusions bilaterally. There is no
appreciable edema or airspace consolidation. There is a 3 mm
granuloma in the anterior segment of the left upper lobe. There is
mild bibasilar atelectasis. There are scattered areas of mosaic
attenuation in the lower lobes. No pneumothorax. Trachea and major
bronchial structures appear patent.

Upper Abdomen: There is reflux of contrast into the inferior vena
cava and hepatic veins. There is upper abdominal aortic
atherosclerosis. Visualized upper abdominal structures otherwise
appear unremarkable.

Musculoskeletal: Status post median sternotomy. No blastic or lytic
bone lesions. No chest wall lesions.

Review of the MIP images confirms the above findings.
IMPRESSION: 1.  No demonstrable pulmonary embolus.

2. Status post aortic valve replacement. Prominence of the ascending
thoracic aorta with measured diameter 4.9 x 4.7 cm. Ascending
thoracic aortic aneurysm. Recommend semi-annual imaging followup by
CTA or MRA and referral to cardiothoracic surgery if not already
obtained. This recommendation follows 0010
ACCF/AHA/AATS/ACR/ASA/SCA/FABI/IVERT/MANCHANDA/SIDATH Guidelines for the
Diagnosis and Management of Patients With Thoracic Aortic Disease.
Circulation. 0010; 121: [id number]. Aortic aneurysm NOS
(H7RIM-QG3.S). No appreciable dissection. Note that the contrast
bolus in the aorta is not sufficient for dissection assessment.

3.  Foci of aortic atherosclerosis.

4.  Cardiomegaly.  No pericardial effusion.

5. Enlargement of the main pulmonary outflow tract to 4.9 cm, an
appearance indicative of pulmonary arterial hypertension.

6. Pleural effusions bilaterally. Areas of atelectatic change. No
consolidation. Scattered areas of mosaic attenuation may indicate a
degree of underlying small airways obstructive disease. Small
granuloma left upper lobe.

7. Reflux of contrast in the inferior vena cava and hepatic veins is
a finding felt to be indicative of increased right heart pressure.

8.  No adenopathy.

Aortic Atherosclerosis (H7RIM-EHE.E).

## 2023-02-01 IMAGING — DX DG CHEST 2V
2 series · 2 of 2 positions shown · non-contrast
Comparison: June 07, 2020

CLINICAL DATA: Chest pain with shortness of breath and cardiac
palpitations

EXAM:
CHEST - 2 VIEW

[chest pa]
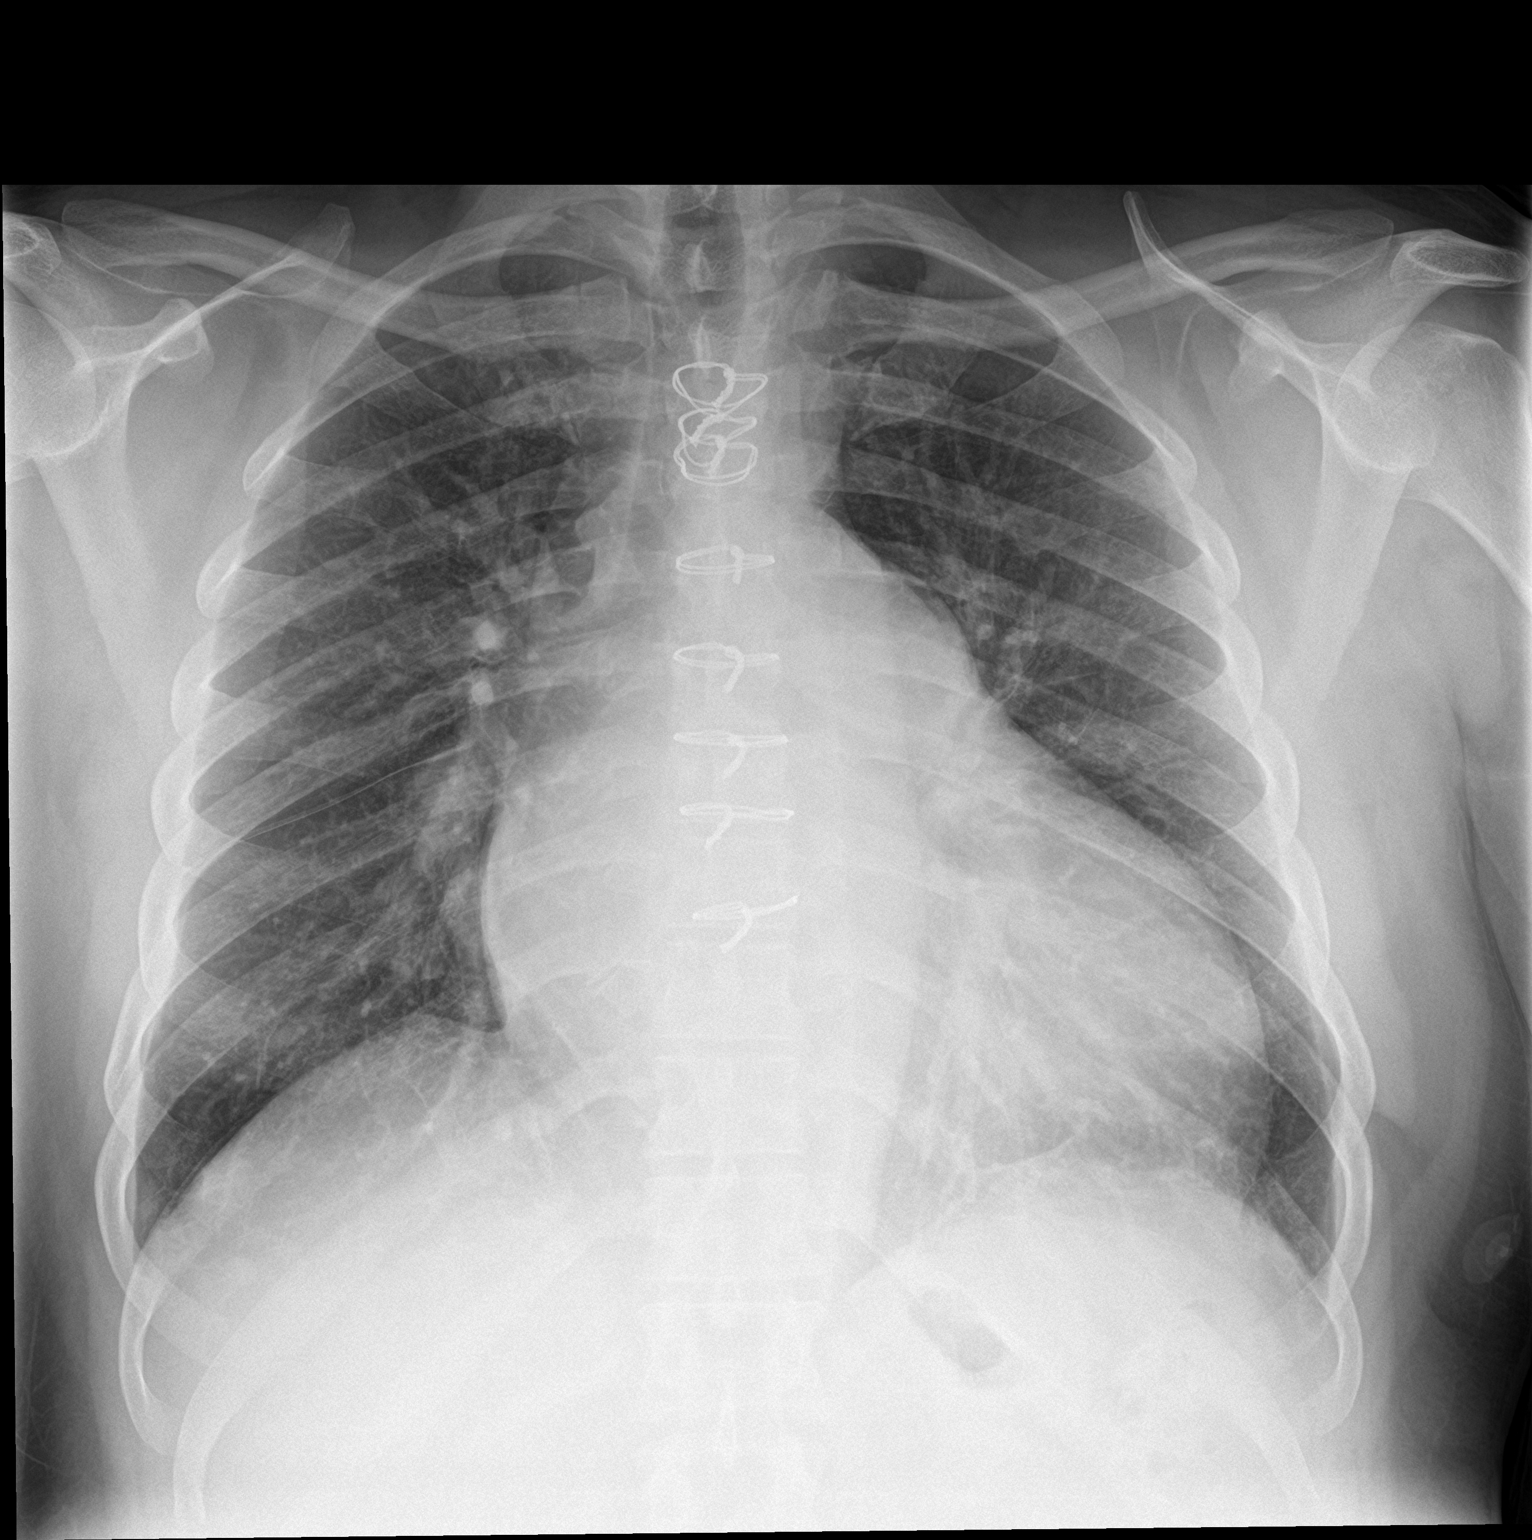

[chest lat]
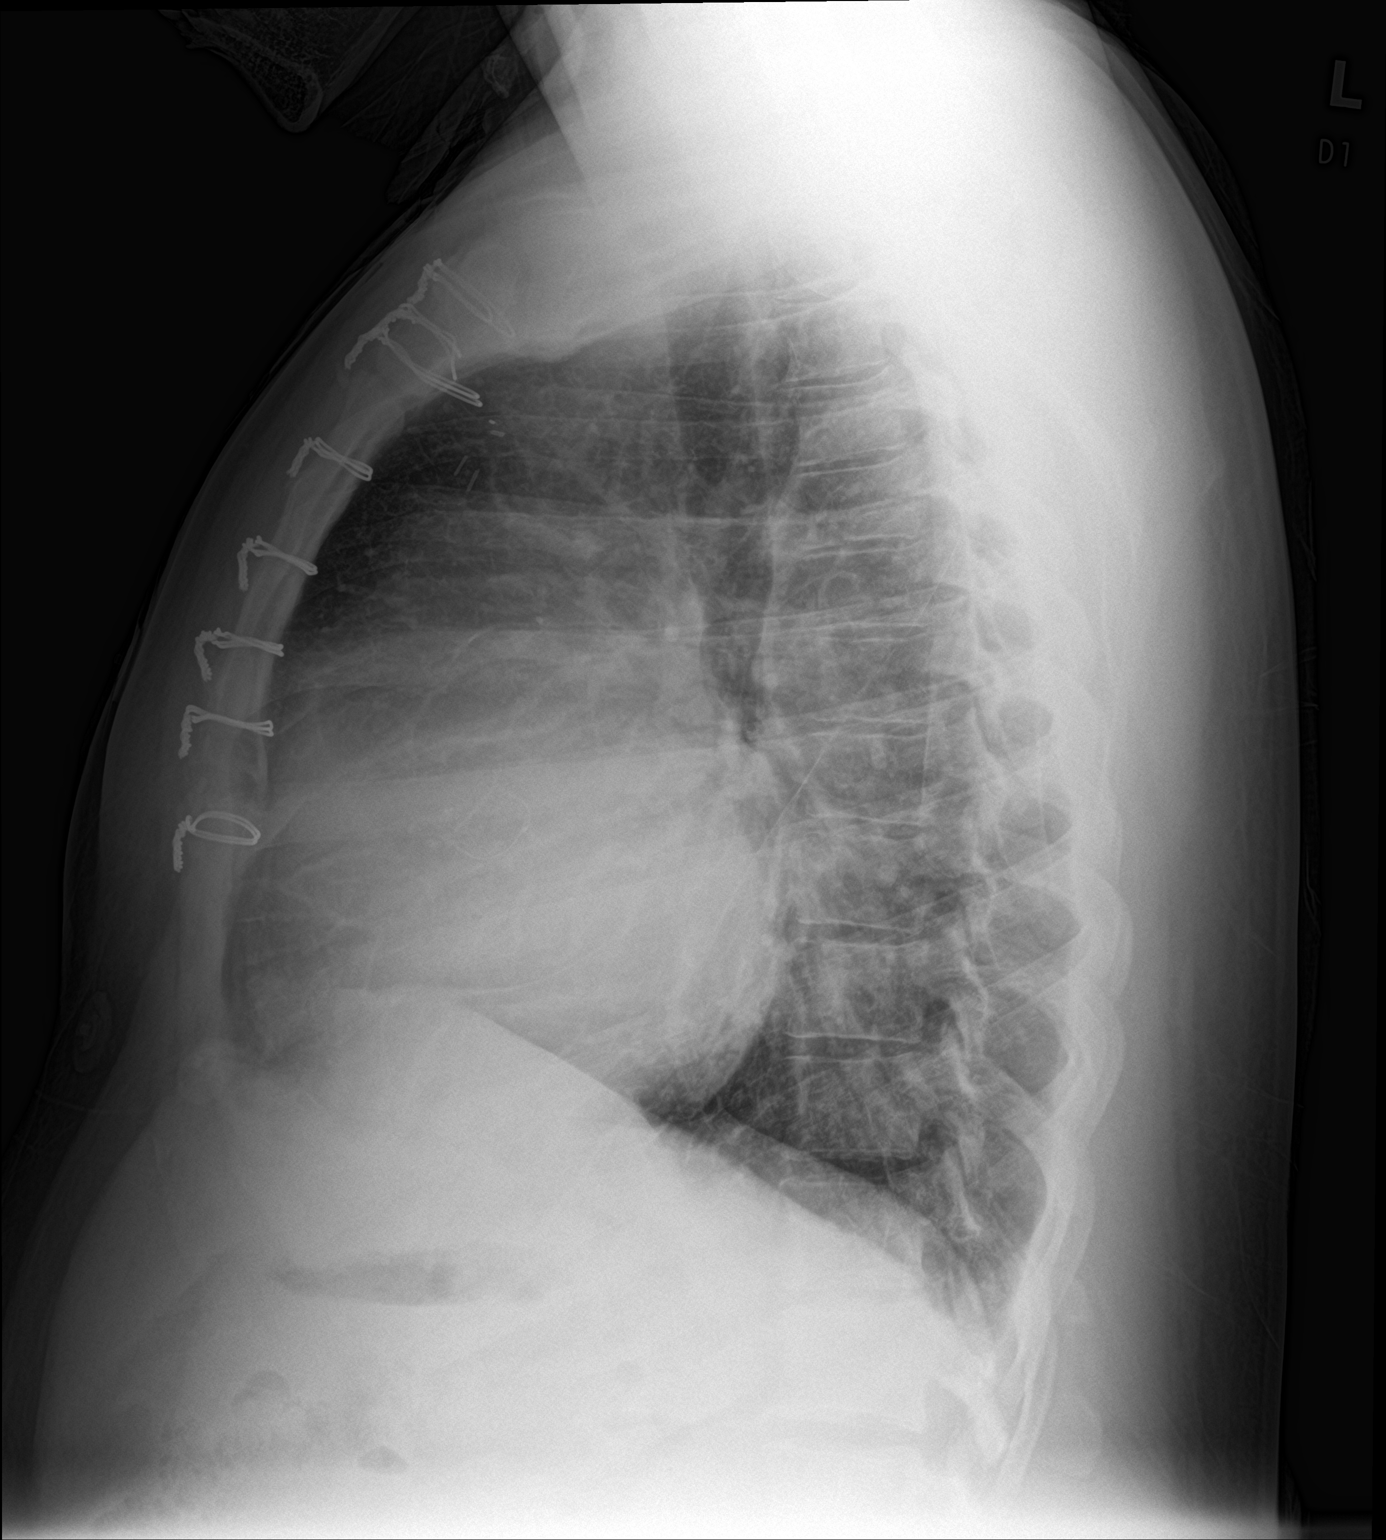

[2 of 2 positions shown; findings below may reference images not displayed]

FINDINGS: There is stable cardiomegaly. Patient is status post median
sternotomy with aortic valve replacement. Pulmonary vascularity
appears within normal limits. Lungs clear. No adenopathy. No bone
lesions.
IMPRESSION: Cardiomegaly. No edema or airspace opacity. Status post median
sternotomy with aortic valve replacement.

## 2023-02-25 DIAGNOSIS — Z48298 Encounter for aftercare following other organ transplant: Secondary | ICD-10-CM | POA: Diagnosis not present

## 2023-02-25 DIAGNOSIS — T8621 Heart transplant rejection: Secondary | ICD-10-CM | POA: Diagnosis not present

## 2023-02-25 DIAGNOSIS — Z79899 Other long term (current) drug therapy: Secondary | ICD-10-CM | POA: Diagnosis not present

## 2023-02-25 DIAGNOSIS — Z2989 Encounter for other specified prophylactic measures: Secondary | ICD-10-CM | POA: Diagnosis not present

## 2023-02-25 DIAGNOSIS — Z941 Heart transplant status: Secondary | ICD-10-CM | POA: Diagnosis not present

## 2023-09-03 ENCOUNTER — Telehealth: Payer: Self-pay | Admitting: Emergency Medicine

## 2023-09-03 ENCOUNTER — Encounter: Payer: Self-pay | Admitting: Emergency Medicine

## 2023-09-03 ENCOUNTER — Ambulatory Visit
Admission: EM | Admit: 2023-09-03 | Discharge: 2023-09-03 | Disposition: A | Discharge status: Home / Self Care | Payer: No Typology Code available for payment source | Attending: Family Medicine | Admitting: Family Medicine

## 2023-09-03 DIAGNOSIS — J111 Influenza due to unidentified influenza virus with other respiratory manifestations: Secondary | ICD-10-CM

## 2023-09-03 DIAGNOSIS — J069 Acute upper respiratory infection, unspecified: Secondary | ICD-10-CM

## 2023-09-03 LAB — POC COVID19/FLU A&B COMBO
Covid Antigen, POC: NEGATIVE
Influenza A Antigen, POC: NEGATIVE
Influenza B Antigen, POC: NEGATIVE

## 2023-09-03 MED ORDER — OSELTAMIVIR PHOSPHATE 75 MG PO CAPS: 75.0000 mg | ORAL_CAPSULE | Freq: Two times a day (BID) | ORAL | 0 refills | Status: DC

## 2023-09-03 MED ORDER — OSELTAMIVIR PHOSPHATE 75 MG PO CAPS
75.0000 mg | ORAL_CAPSULE | Freq: Two times a day (BID) | ORAL | 0 refills | Status: AC
Start: 2023-09-03 — End: 2023-09-08

## 2023-09-03 MED ORDER — AZELASTINE HCL 0.1 % NA SOLN: 1.0000 | Freq: Two times a day (BID) | NASAL | 0 refills | Status: DC

## 2023-09-03 NOTE — Telephone Encounter (Signed)
Patient called and states [facility] does not have Tamiflu.  Rx sent to [facility] pharmacy in [place].

## 2023-09-03 NOTE — Discharge Instructions (Signed)
Your COVID and flu testing was negative today but I suspect a flulike illness causing your symptoms.  We will try the Tamiflu in addition to a nasal spray to help with congestion and sinus pressure.  Continue over-the-counter remedies additionally.

## 2023-09-03 NOTE — ED Triage Notes (Signed)
Headache, right eye pain, cough, nasal congestion since yesterday.

## 2023-09-03 NOTE — ED Provider Notes (Signed)
[facility]    CSN: [id number] Arrival date & time: 09/03/23  1305      History   Chief Complaint No chief complaint on file.   HPI [name] [name] is a 43 y.o. male.   Presenting today with 1 day history of congestion, sinus pressure, right eye pressure, cough, headache, body aches, chills.  Denies chest pain, shortness of breath, abdominal pain, vomiting, diarrhea.  So far trying Coricidin HBP and Tylenol with minimal relief.  No known sick contacts recently.  Complicated past medical history to include numerous cardiac issues status post heart transplant, history of pneumonia, chronic kidney disease.    Past Medical History:  Diagnosis Date   Anxiety    CHF (congestive heart failure) (HCC)    Chronic systolic heart failure (HCC)    CKD (chronic kidney disease) stage 2, GFR 60-89 ml/min    Dyspnea    with value issues- "if I dont take my medication"   Essential hypertension, benign    Headache    History of pneumonia    Mitral regurgitation    Moderate   Noncompliance    Nonischemic cardiomyopathy (HCC)    Normal coronaries May 2012, LVEF < 20%   Pneumonia 2011   S/P aortic valve repair 05/11/2017   Complex valvuloplasty including plication of left coronary leaflet and 19mm Biostable HAART ring annuloplasty    Patient Active Problem List   Diagnosis Date Noted   Acute on chronic systolic (congestive) heart failure (HCC) 04/02/2022   Hyponatremia    Pleural effusion    Biventricular heart failure (HCC)    Pneumonia due to infectious organism    Acute respiratory failure with hypoxia (HCC)    Acute pulmonary edema (HCC)    Acute on chronic combined systolic and diastolic heart failure (HCC) 02/19/2022   Sinus tachycardia 02/19/2022   Diabetes mellitus with complication (HCC) 02/10/2022   Cardiogenic shock (HCC)    Heart failure with acute decompensation, type unknown (HCC) 02/09/2022   CHF exacerbation (HCC) 02/08/2022   Inguinal hernia  02/08/2022   Constipation 02/08/2022   Anasarca    Acute heart failure reduced EF  12/23/2021   S/P aortic valve repair 05/11/2017   Aortic valve regurgitation    Chronic systolic CHF (congestive heart failure) (HCC)    Hypertensive emergency 05/30/2015   Essential hypertension 11/22/2014   Shift work sleep disorder 10/30/2014   Obstructive sleep apnea 10/30/2014   Hypertensive crisis 09/26/2014   Hypertensive urgency 06/13/2013   CHF (congestive heart failure) (HCC) 06/13/2013   AKI on CKD3 06/13/2013   Nonischemic cardiomyopathy (HCC) 06/13/2013   HTN (hypertension) 02/25/2012   Chronic combined systolic and diastolic heart failure (HCC) 01/08/2012   Elevated troponin 01/01/2012   Acute on chronic combined systolic and diastolic CHF (congestive heart failure) (HCC) 12/31/2011   Hypokalemia 12/31/2011   Chest pain 12/31/2011   AKI (acute kidney injury) (HCC) 12/31/2011    Past Surgical History:  Procedure Laterality Date   AORTIC VALVE REPAIR N/A 05/11/2017   Procedure: AORTIC VALVE REPAIR;  Surgeon: Purcell Nails, MD;  Location: [facility] OR;  Service: Open Heart Surgery;  Laterality: N/A;   CARDIAC SURGERY     IABP INSERTION N/A 02/19/2022   Procedure: IABP Insertion;  Surgeon: Dolores Patty, MD;  Location: [facility]IVE [facility];  Service: Cardiovascular;  Laterality: N/A;   RIGHT HEART CATH N/A 02/19/2022   Procedure: RIGHT HEART CATH;  Surgeon: Dolores Patty, MD;  Location: [facility];  Service: Cardiovascular;  Laterality: N/A;   RIGHT HEART CATH N/A 04/03/2022   Procedure: RIGHT HEART CATH;  Surgeon: Dolores Patty, MD;  Location: [facility];  Service: Cardiovascular;  Laterality: N/A;   RIGHT/LEFT HEART CATH AND CORONARY ANGIOGRAPHY N/A 04/20/2017   Procedure: RIGHT/LEFT HEART CATH AND CORONARY ANGIOGRAPHY;  Surgeon: Dolores Patty, MD;  Location: [facility];  Service: Cardiovascular;  Laterality: N/A;   SURGERY SCROTAL / TESTICULAR      Testicular torsion   TEE WITHOUT CARDIOVERSION N/A 04/09/2017   Procedure: TRANSESOPHAGEAL ECHOCARDIOGRAM (TEE);  Surgeon: Dolores Patty, MD;  Location: [facility] ENDOSCOPY;  Service: Cardiovascular;  Laterality: N/A;   TEE WITHOUT CARDIOVERSION N/A 05/11/2017   Procedure: TRANSESOPHAGEAL ECHOCARDIOGRAM (TEE);  Surgeon: Purcell Nails, MD;  Location: [facility] OR;  Service: Open Heart Surgery;  Laterality: N/A;   TEE WITHOUT CARDIOVERSION N/A 01/20/2022   Procedure: TRANSESOPHAGEAL ECHOCARDIOGRAM (TEE);  Surgeon: Dolores Patty, MD;  Location: [facility] ENDOSCOPY;  Service: Cardiovascular;  Laterality: N/A;       Home Medications    Prior to Admission medications   Medication Sig Start Date End Date Taking? Authorizing Provider  azelastine (ASTELIN) 0.1 % nasal spray Place 1 spray into both nostrils 2 (two) times daily. Use in each nostril as directed 09/03/23  Yes Particia Nearing, PA-C  oseltamivir (TAMIFLU) 75 MG capsule Take 1 capsule (75 mg total) by mouth every 12 (twelve) hours. 09/03/23  Yes Particia Nearing, PA-C  acetaminophen (TYLENOL) 325 MG tablet Take 325 mg by mouth in the morning and at bedtime.    [provider]  aspirin 81 MG chewable tablet Chew 81 mg by mouth daily. 09/21/22 09/21/23  [provider]  Calcium Carb-Cholecalciferol 600-10 MG-MCG TABS Take 1 tablet by mouth in the morning and at bedtime. 05/05/22 05/05/23  [provider]  carvedilol (COREG) 3.125 MG tablet Take 3.125 mg by mouth in the morning and at bedtime. 09/03/22 09/03/23  [provider]  furosemide (LASIX) 10 MG/ML injection Inject 6 mLs (60 mg total) into the vein 2 (two) times daily. Patient not taking: Reported on 09/25/2022 04/06/22   Andrey Farmer, PA-C  gabapentin (NEURONTIN) 100 MG capsule Take 100 mg by mouth daily.    [provider]  magnesium oxide (MAG-OX) 400 (240 Mg) MG tablet Take 2 tablets by mouth 2 (two) times daily. 06/08/22    [provider]  metFORMIN (GLUCOPHAGE-XR) 500 MG 24 hr tablet Take 500 mg by mouth 2 (two) times daily.    [provider]  mycophenolate (CELLCEPT) 500 MG tablet Take 1,000 mg by mouth 2 (two) times daily.    [provider]  pantoprazole (PROTONIX) 40 MG tablet Take 40 mg by mouth daily.    [provider]  pravastatin (PRAVACHOL) 40 MG tablet Take 40 mg by mouth daily.    [provider]  predniSONE (DELTASONE) 5 MG tablet Take 7.5 mg by mouth in the morning and at bedtime. 09/21/22   [provider]  SENEXON-S 8.6-50 MG tablet Take 2 tablets by mouth 2 (two) times daily. 06/08/22   [provider]  spironolactone (ALDACTONE) 25 MG tablet Take 1 tablet (25 mg total) by mouth daily. Patient not taking: Reported on 09/25/2022 04/06/22   Andrey Farmer, PA-C  tacrolimus (PROGRAF) 1 MG capsule Take 4 mg by mouth 2 (two) times daily. 09/03/22 09/03/23  [provider]  temazepam (RESTORIL) 30 MG capsule Take 1 capsule (30  mg total) by mouth at bedtime as needed for sleep. Patient not taking: Reported on 09/25/2022 03/23/22   Alen Bleacher, NP  torsemide (DEMADEX) 20 MG tablet Take 20 mg by mouth 2 (two) times daily. 06/22/22   [provider]  traZODone (DESYREL) 100 MG tablet Take 100 mg by mouth at bedtime.    [provider]  valGANciclovir (VALCYTE) 450 MG tablet Take 450 mg by mouth daily.    [provider]  VITAMIN D3 1.25 MG (50000 UT) capsule Take 50,000 Units by mouth once a week.    [provider]    Family History Family History  Problem Relation Age of Onset   Breast cancer Mother    Stroke Father    Hypertension Father     Social History Social History   Tobacco Use   Smoking status: Never   Smokeless tobacco: Never  Vaping Use   Vaping status: Never Used  Substance Use Topics   Alcohol use: Yes    Alcohol/week: 0.0 standard drinks of alcohol    Comment:  occasional   Drug use: No     Allergies   Mycophenolate mofetil   Review of Systems Review of Systems Per HPI  Physical Exam Triage Vital Signs ED Triage Vitals  Encounter Vitals Group     BP 09/03/23 1359 135/87     Systolic BP Percentile --      Diastolic BP Percentile --      Pulse Rate 09/03/23 1359 (!) 108     Resp 09/03/23 1359 18     Temp 09/03/23 1359 99.2 F (37.3 C)     Temp Source 09/03/23 1359 Oral     SpO2 09/03/23 1359 96 %     Weight --      Height --      Head Circumference --      Peak Flow --      Pain Score 09/03/23 1400 8     Pain Loc --      Pain Education --      Exclude from Growth Chart --    No data found.  Updated Vital Signs BP 135/87 (BP Location: Right Arm)   Pulse (!) 108   Temp 99.2 F (37.3 C) (Oral)   Resp 18   SpO2 96%   Visual Acuity Right Eye Distance:   Left Eye Distance:   Bilateral Distance:    Right Eye Near:   Left Eye Near:    Bilateral Near:     Physical Exam Vitals and nursing note reviewed.  Constitutional:      Appearance: He is well-developed.  HENT:     Head: Atraumatic.     Right Ear: Tympanic membrane and external ear normal.     Left Ear: Tympanic membrane and external ear normal.     Nose: Rhinorrhea present.     Mouth/Throat:     Pharynx: Posterior oropharyngeal erythema present. No oropharyngeal exudate.  Eyes:     Conjunctiva/sclera: Conjunctivae normal.     Pupils: Pupils are equal, round, and reactive to light.  Cardiovascular:     Rate and Rhythm: Normal rate and regular rhythm.  Pulmonary:     Effort: Pulmonary effort is normal. No respiratory distress.     Breath sounds: No wheezing or rales.  Musculoskeletal:        General: Normal range of motion.     Cervical back: Normal range of motion and neck supple.  Lymphadenopathy:  Cervical: No cervical adenopathy.  Skin:    General: Skin is warm and dry.  Neurological:     Mental Status: He is alert and oriented to person, place,  and time.  Psychiatric:        Behavior: Behavior normal.      UC Treatments / Results  Labs (all labs ordered are listed, but only abnormal results are displayed) Labs Reviewed  POC COVID19/FLU A&B COMBO - Normal    EKG   Radiology No results found.  Procedures Procedures (including critical care time)  Medications Ordered in UC Medications - No data to display  Initial Impression / Assessment and Plan / UC Course  I have reviewed the triage vital signs and the nursing notes.  Pertinent labs & imaging results that were available during my care of the patient were reviewed by me and considered in my medical decision making (see chart for details).     Mildly tachycardic in triage, otherwise vital signs reassuring.  He is in no acute distress.  Rapid COVID and flu negative but symptoms consistent with flulike illness.  Treat with Tamiflu, Astelin, supportive over-the-counter medications and home care.  Return for worsening symptoms.  Final Clinical Impressions(s) / UC Diagnoses   Final diagnoses:  Viral URI with cough  Influenza-like illness     Discharge Instructions      Your COVID and flu testing was negative today but I suspect a flulike illness causing your symptoms.  We will try the Tamiflu in addition to a nasal spray to help with congestion and sinus pressure.  Continue over-the-counter remedies additionally.    ED Prescriptions     Medication Sig Dispense Auth. Provider   oseltamivir (TAMIFLU) 75 MG capsule Take 1 capsule (75 mg total) by mouth every 12 (twelve) hours. 10 capsule Particia Nearing, PA-C   azelastine (ASTELIN) 0.1 % nasal spray Place 1 spray into both nostrils 2 (two) times daily. Use in each nostril as directed 30 mL Particia Nearing, PA-C      PDMP not reviewed this encounter.   Particia Nearing, [place] 09/03/23 1456

## 2023-12-11 ENCOUNTER — Emergency Department (HOSPITAL_COMMUNITY)

## 2023-12-11 ENCOUNTER — Other Ambulatory Visit: Payer: Self-pay

## 2023-12-11 ENCOUNTER — Emergency Department (HOSPITAL_COMMUNITY)
Admission: EM | Admit: 2023-12-11 | Discharge: 2024-01-11 | Disposition: E | Attending: Emergency Medicine | Admitting: Emergency Medicine

## 2023-12-11 DIAGNOSIS — R799 Abnormal finding of blood chemistry, unspecified: Secondary | ICD-10-CM | POA: Insufficient documentation

## 2023-12-11 DIAGNOSIS — I469 Cardiac arrest, cause unspecified: Secondary | ICD-10-CM | POA: Insufficient documentation

## 2023-12-11 DIAGNOSIS — R0602 Shortness of breath: Secondary | ICD-10-CM | POA: Diagnosis present

## 2023-12-11 LAB — BLOOD GAS, ARTERIAL
Acid-base deficit: 19.4 mmol/L — ABNORMAL HIGH (ref 0.0–2.0)
Bicarbonate: 8.5 mmol/L — ABNORMAL LOW (ref 20.0–28.0)
Drawn by: 22223
O2 Saturation: 99.3 %
Patient temperature: 37
pCO2 arterial: 26 mmHg — ABNORMAL LOW (ref 32–48)
pH, Arterial: 7.12 — CL (ref 7.35–7.45)
pO2, Arterial: 226 mmHg — ABNORMAL HIGH (ref 83–108)

## 2023-12-11 LAB — CBC WITH DIFFERENTIAL/PLATELET
Abs Immature Granulocytes: 0.17 10*3/uL — ABNORMAL HIGH (ref 0.00–0.07)
Basophils Absolute: 0.1 10*3/uL (ref 0.0–0.1)
Basophils Relative: 1 %
Eosinophils Absolute: 0.3 10*3/uL (ref 0.0–0.5)
Eosinophils Relative: 1 %
HCT: 42.5 % (ref 39.0–52.0)
Hemoglobin: 13.1 g/dL (ref 13.0–17.0)
Immature Granulocytes: 1 %
Lymphocytes Relative: 77 %
Lymphs Abs: 16.4 10*3/uL — ABNORMAL HIGH (ref 0.7–4.0)
MCH: 25.7 pg — ABNORMAL LOW (ref 26.0–34.0)
MCHC: 30.8 g/dL (ref 30.0–36.0)
MCV: 83.3 fL (ref 80.0–100.0)
Monocytes Absolute: 0.8 10*3/uL (ref 0.1–1.0)
Monocytes Relative: 4 %
Neutro Abs: 3.3 10*3/uL (ref 1.7–7.7)
Neutrophils Relative %: 16 %
Platelets: 192 10*3/uL (ref 150–400)
RBC: 5.1 MIL/uL (ref 4.22–5.81)
RDW: 15.7 % — ABNORMAL HIGH (ref 11.5–15.5)
WBC: 21.1 10*3/uL — ABNORMAL HIGH (ref 4.0–10.5)
nRBC: 0.2 % (ref 0.0–0.2)

## 2023-12-11 LAB — COMPREHENSIVE METABOLIC PANEL WITH GFR
ALT: 45 U/L — ABNORMAL HIGH (ref 0–44)
AST: 69 U/L — ABNORMAL HIGH (ref 15–41)
Albumin: 3.4 g/dL — ABNORMAL LOW (ref 3.5–5.0)
Alkaline Phosphatase: 102 U/L (ref 38–126)
Anion gap: 16 — ABNORMAL HIGH (ref 5–15)
BUN: 55 mg/dL — ABNORMAL HIGH (ref 6–20)
CO2: 14 mmol/L — ABNORMAL LOW (ref 22–32)
Calcium: 8.8 mg/dL — ABNORMAL LOW (ref 8.9–10.3)
Chloride: 105 mmol/L (ref 98–111)
Creatinine, Ser: 3.11 mg/dL — ABNORMAL HIGH (ref 0.61–1.24)
GFR, Estimated: 25 mL/min — ABNORMAL LOW (ref >60)
Glucose, Bld: 182 mg/dL — ABNORMAL HIGH (ref 70–99)
Potassium: 4.5 mmol/L (ref 3.5–5.1)
Sodium: 135 mmol/L (ref 135–145)
Total Bilirubin: 0.7 mg/dL (ref 0.0–1.2)
Total Protein: 6.6 g/dL (ref 6.5–8.1)

## 2023-12-11 LAB — CBG MONITORING, ED: Glucose-Capillary: 151 mg/dL — ABNORMAL HIGH (ref 70–99)

## 2023-12-11 LAB — TROPONIN I (HIGH SENSITIVITY): Troponin I (High Sensitivity): 40 ng/L — ABNORMAL HIGH (ref <18)

## 2023-12-11 MED ORDER — SUCCINYLCHOLINE CHLORIDE 20 MG/ML IJ SOLN
INTRAMUSCULAR | Status: AC | PRN
  Administered 2023-12-11: 80 mg via INTRAVENOUS
  Administered 2023-12-11: 120 mg via INTRAVENOUS

## 2023-12-11 MED ORDER — METHYLPREDNISOLONE SODIUM SUCC 125 MG IJ SOLR
125.0000 mg | Freq: Once | INTRAMUSCULAR | Status: AC
  Administered 2023-12-12: 125 mg via INTRAVENOUS
  Filled 2023-12-11: qty 2

## 2023-12-11 MED ORDER — PROPOFOL 1000 MG/100ML IV EMUL
INTRAVENOUS | Status: AC
  Administered 2023-12-11: 5.061 ug/kg/min via INTRAVENOUS
  Filled 2023-12-11: qty 100

## 2023-12-11 MED ORDER — FENTANYL 2500MCG IN NS 250ML (10MCG/ML) PREMIX INFUSION: 0.0000 ug/h | INTRAVENOUS | Status: DC

## 2023-12-11 MED ORDER — PROPOFOL 1000 MG/100ML IV EMUL: 0.0000 ug/kg/min | INTRAVENOUS | Status: DC

## 2023-12-11 MED ORDER — SODIUM BICARBONATE 8.4 % IV SOLN
50.0000 meq | Freq: Once | INTRAVENOUS | Status: AC
  Administered 2023-12-11: 50 meq via INTRAVENOUS
  Filled 2023-12-11: qty 50

## 2023-12-11 MED ORDER — MIDAZOLAM HCL 5 MG/5ML IJ SOLN
5.0000 mg | Freq: Once | INTRAMUSCULAR | Status: AC
  Administered 2023-12-11: 5 mg via INTRAVENOUS
  Filled 2023-12-11: qty 5

## 2023-12-11 MED ORDER — ETOMIDATE 2 MG/ML IV SOLN
INTRAVENOUS | Status: AC | PRN
  Administered 2023-12-11: 20 mg via INTRAVENOUS

## 2023-12-11 NOTE — ED Triage Notes (Signed)
 Pt arrived to er post cpr after being called out by family due to sob, upon ems arrival to pt had agonal breathing, faint pulses, pt was given cpr for 7-8 minutes with return of pulses, pt arrived to er NSR with rate in 80's. Dr Bryna Car at bedside upon arrival to er,

## 2023-12-12 DIAGNOSIS — I469 Cardiac arrest, cause unspecified: Secondary | ICD-10-CM | POA: Diagnosis not present

## 2023-12-12 LAB — TROPONIN I (HIGH SENSITIVITY): Troponin I (High Sensitivity): 77 ng/L — ABNORMAL HIGH (ref <18)

## 2023-12-12 LAB — LACTIC ACID, PLASMA
Lactic Acid, Venous: 4.3 mmol/L (ref 0.5–1.9)
Lactic Acid, Venous: 6.2 mmol/L (ref 0.5–1.9)

## 2023-12-12 LAB — CBG MONITORING, ED: Glucose-Capillary: 161 mg/dL — ABNORMAL HIGH (ref 70–99)

## 2023-12-12 MED ORDER — EPINEPHRINE 1 MG/10ML IJ SOSY
PREFILLED_SYRINGE | INTRAMUSCULAR | Status: AC | PRN
  Administered 2023-12-12 (×5): 1 mg via INTRAVENOUS

## 2023-12-12 MED ORDER — EPINEPHRINE 1 MG/10ML IJ SOSY
PREFILLED_SYRINGE | INTRAMUSCULAR | Status: AC | PRN
  Administered 2023-12-12 (×2): 1 mg via INTRAVENOUS

## 2023-12-12 MED ORDER — SODIUM BICARBONATE 8.4 % IV SOLN
INTRAVENOUS | Status: DC
  Filled 2023-12-12 (×2): qty 1000

## 2023-12-12 MED ORDER — SODIUM BICARBONATE 8.4 % IV SOLN
INTRAVENOUS | Status: AC | PRN
  Administered 2023-12-12 (×2): 50 meq via INTRAVENOUS

## 2023-12-12 MED ORDER — SODIUM BICARBONATE 8.4 % IV SOLN
INTRAVENOUS | Status: AC
  Filled 2023-12-12: qty 150

## 2023-12-12 DEATH — deceased

## 2023-12-17 LAB — CULTURE, BLOOD (ROUTINE X 2)
Culture: NO GROWTH
Culture: NO GROWTH

## 2024-01-11 NOTE — ED Notes (Signed)
 Received called from [facility]) states wife of pt has declined donor svcs. Ref# [id number]

## 2024-01-11 NOTE — ED Provider Notes (Signed)
 Received patient at shift change from Dr. Zammit.  Patient awaiting transport to [facility].  Patient initially came to the ED after cardiac arrest with ROSC.  Patient with prior heart transplant.  He is ventilated and sedated.    Physical Exam  BP 108/85   Pulse (!) 0   Temp 98.8 F (37.1 C)   Resp (!) 35   Ht 5\' 8"  (1.727 m)   Wt 95.5 kg   SpO2 (!) 74%   BMI 32.01 kg/m   Physical Exam Constitutional:      General: He is in acute distress.     Appearance: He is ill-appearing.  HENT:     Head: Atraumatic.  Cardiovascular:     Comments: Absent heart sounds Pulmonary:     Comments: Breath sounds equal, ventilated Abdominal:     Palpations: Abdomen is soft.  Musculoskeletal:        General: No deformity.     Cervical back: Neck supple.  Skin:    General: Skin is dry.  Neurological:     Comments: unresponsive  Psychiatric:        Behavior: Behavior normal.     Procedures  .Critical Care  Performed by: Ballard Bongo, MD Authorized by: Ballard Bongo, MD   Critical care provider statement:    Critical care time (minutes):  30   Critical care time was exclusive of:  Separately billable procedures and treating other patients   Critical care was necessary to treat or prevent imminent or life-threatening deterioration of the following conditions:  Cardiac failure   Critical care was time spent personally by me on the following activities:  Development of treatment plan with patient or surrogate, discussions with consultants, evaluation of patient's response to treatment, examination of patient, ordering and review of laboratory studies, ordering and review of radiographic studies, ordering and performing treatments and interventions, pulse oximetry, re-evaluation of patient's condition and review of old charts   I assumed direction of critical care for this patient from another provider in my specialty: yes     Cardiopulmonary Resuscitation (CPR) Procedure  Note Directed/Performed by: Ballard Bongo I personally directed ancillary staff and/or performed CPR in an effort to regain return of spontaneous circulation and to maintain cardiac, neuro and systemic perfusion.    ED Course / MDM    Medical Decision Making Amount and/or Complexity of Data Reviewed Labs:  Decision-making details documented in ED Course.  Risk Prescription drug management.   Nurses alerted me to patient becoming bradycardic.  Upon entering the room, heart rate dropped down into the 40s.  Evaluation revealed that the patient did not have pulses.  Patient taken off ventilator, bagged and CPR initiated.  ACLS protocols followed, patient was administered a total of 6 epi pushes with appropriate rhythm and pulse checks.  Patient never regained pulses.  Patient persistently in PEA.  He had been acidotic, was given 2 pushes of bicarb with the epi doses.  I did have multiple discussions with family.  They were kept updated on the patient's lack of response to interventions.  Ultimately I did recommend terminating CPR and wife did agree with this.  Patient was declared dead at 01:06.       Ballard Bongo, MD 12/31/2023 [phone]

## 2024-01-11 NOTE — ED Provider Notes (Signed)
 [place] EMERGENCY DEPARTMENT AT [facility] Provider Note   CSN: [id number] Arrival date & time: 12/11/23  2213     History  Chief Complaint  Patient presents with   Cardiac Arrest    [name] [name] is a 43 y.o. male.  Patient has a history of heart transplant.  According to his wife he has been sick for couple days and today he became very weak and short of breath.  She stated that he became unconscious and she had to do CPR on him.  When paramedics arrived the patient had a heartbeat and a were transporting him to the emergency department but he lost his pulse.  So they did CPR on for 8 minutes.  When patient arrived in the emergency department he had a pulse and a decent blood pressure but was not protecting his airway so he was intubated  The history is provided by the EMS personnel. [name] language interpreter was used.  Cardiac Arrest Witnessed by:  Family member Incident location:  En route to the ED Time since incident:  8 minutes Time before BLS initiated:  Immediate Time before ALS initiated:  8-10 minutes Condition upon EMS arrival:  Agonal respirations Pulse:  Present Initial cardiac rhythm per EMS:  Normal sinus      Home Medications Prior to Admission medications   Medication Sig Start Date End Date Taking? Authorizing Provider  acetaminophen  (TYLENOL ) 325 MG tablet Take 325 mg by mouth in the morning and at bedtime.    [provider]  azelastine  (ASTELIN ) 0.1 % nasal spray Place 1 spray into both nostrils 2 (two) times daily. Use in each nostril as directed 09/03/23   Corbin Dess, PA-C  Calcium Carb-Cholecalciferol 600-10 MG-MCG TABS Take 1 tablet by mouth in the morning and at bedtime. 05/05/22 05/05/23  [provider]  furosemide  (LASIX ) 10 MG/ML injection Inject 6 mLs (60 mg total) into the vein 2 (two) times daily. Patient not taking: Reported on 09/25/2022 04/06/22   Arleene Belt, PA-C  gabapentin  (NEURONTIN) 100 MG capsule Take 100 mg by mouth daily.    [provider]  magnesium  oxide (MAG-OX) 400 (240 Mg) MG tablet Take 2 tablets by mouth 2 (two) times daily. 06/08/22   [provider]  metFORMIN (GLUCOPHAGE-XR) 500 MG 24 hr tablet Take 500 mg by mouth 2 (two) times daily.    [provider]  mycophenolate (CELLCEPT) 500 MG tablet Take 1,000 mg by mouth 2 (two) times daily.    [provider]  pantoprazole  (PROTONIX ) 40 MG tablet Take 40 mg by mouth daily.    [provider]  pravastatin (PRAVACHOL) 40 MG tablet Take 40 mg by mouth daily.    [provider]  predniSONE  (DELTASONE ) 5 MG tablet Take 7.5 mg by mouth in the morning and at bedtime. 09/21/22   [provider]  SENEXON-S 8.6-50 MG tablet Take 2 tablets by mouth 2 (two) times daily. 06/08/22   [provider]  spironolactone  (ALDACTONE ) 25 MG tablet Take 1 tablet (25 mg total) by mouth daily. Patient not taking: Reported on 09/25/2022 04/06/22   Arleene Belt, PA-C  temazepam  (RESTORIL ) 30 MG capsule Take 1 capsule (30 mg total) by mouth at bedtime as needed for sleep. Patient not taking: Reported on 09/25/2022 03/23/22   Sheryl Donna, NP  torsemide  (DEMADEX ) 20 MG tablet Take 20 mg by mouth 2 (two) times daily. 06/22/22   [provider]  traZODone  (DESYREL )  100 MG tablet Take 100 mg by mouth at bedtime.    [provider]  valGANciclovir (VALCYTE) 450 MG tablet Take 450 mg by mouth daily.    [provider]  VITAMIN D3 1.25 MG (50000 UT) capsule Take 50,000 Units by mouth once a week.    [provider]      Allergies    Mycophenolate mofetil    Review of Systems   Review of Systems  Unable to perform ROS: Mental status change    Physical Exam Updated Vital Signs BP 108/85   Pulse (!) 103   Temp 98.8 F (37.1 C)   Resp (!) 35   Ht 5\' 8"  (1.727 m)   Wt 95.5 kg   SpO2 (!) 74%   BMI 32.01 kg/m   Physical Exam Vitals and nursing note reviewed.  Constitutional:      Appearance: He is well-developed.     Comments: Lethargic  HENT:     Head: Normocephalic.     Nose: Nose normal.     Mouth/Throat:     Mouth: Mucous membranes are moist.  Eyes:     General: [name] scleral icterus.    Conjunctiva/sclera: Conjunctivae normal.  Neck:     Thyroid : [name] thyromegaly.  Cardiovascular:     Rate and Rhythm: Normal rate and regular rhythm.     Heart sounds: [name] murmur heard.    [name] friction rub. [name] gallop.  Pulmonary:     Breath sounds: [name] stridor. [name] wheezing or rales.     Comments: Agonal breaths Chest:     Chest wall: [name] tenderness.  Abdominal:     General: There is [name] distension.     Tenderness: There is [name] abdominal tenderness. There is [name] rebound.  Musculoskeletal:        General: Normal range of motion.     Cervical back: Neck supple.  Lymphadenopathy:     Cervical: [name] cervical adenopathy.  Skin:    Findings: [name] erythema or rash.  Neurological:     Motor: [name] abnormal muscle tone.     Coordination: Coordination normal.     Comments: Responding to painful stimuli only     ED Results / Procedures / Treatments   Labs (all labs ordered are listed, but only abnormal results are displayed) Labs Reviewed  CBC WITH DIFFERENTIAL/PLATELET - Abnormal; Notable for the following components:      Result Value   WBC 21.1 (*)    MCH 25.7 (*)    RDW 15.7 (*)    Lymphs Abs 16.4 (*)    Abs Immature Granulocytes 0.17 (*)    All other components within normal limits  COMPREHENSIVE METABOLIC PANEL WITH GFR - Abnormal; Notable for the following components:   CO2 14 (*)    Glucose, Bld 182 (*)    BUN 55 (*)    Creatinine, Ser 3.11 (*)    Calcium 8.8 (*)    Albumin  3.4 (*)    AST 69 (*)    ALT 45 (*)    GFR, Estimated 25 (*)    Anion gap 16 (*)    All other components within normal limits  BLOOD GAS, ARTERIAL - Abnormal; Notable for the following components:   pH, Arterial 7.12 (*)     pCO2 arterial 26 (*)    pO2, Arterial 226 (*)    Bicarbonate 8.5 (*)    Acid-base deficit 19.4 (*)    All other components within normal limits  LACTIC ACID, PLASMA -  Abnormal; Notable for the following components:   Lactic Acid, Venous 4.3 (*)    All other components within normal limits  CBG MONITORING, ED - Abnormal; Notable for the following components:   Glucose-Capillary 151 (*)    All other components within normal limits  TROPONIN I (HIGH SENSITIVITY) - Abnormal; Notable for the following components:   Troponin I (High Sensitivity) 40 (*)    All other components within normal limits  CULTURE, BLOOD (ROUTINE X 2)  CULTURE, BLOOD (ROUTINE X 2)  URINALYSIS, ROUTINE W REFLEX MICROSCOPIC  LACTIC ACID, PLASMA  I-STAT CHEM 8, ED  TROPONIN I (HIGH SENSITIVITY)    EKG None  Radiology DG Chest Portable 1 View Result Date: 12/11/2023 CLINICAL DATA:  Intubation and OG tube placement.  Post CPR. EXAM: PORTABLE CHEST 1 VIEW COMPARISON:  04/02/2022 FINDINGS: Postoperative changes in the mediastinum. An endotracheal tube has been placed with tip measuring 3.9 cm above the carina. An enteric tube has been placed. Tip projects over the left upper quadrant consistent with location in the body of the stomach. Shallow inspiration. Cardiac enlargement. [name] vascular congestion, edema, or consolidation. Visualized ribs are nondisplaced. Soft tissues are unremarkable. IMPRESSION: Appliances appear in satisfactory position. Shallow inspiration with cardiac enlargement. Electronically Signed   By: Boyce Byes M.D.   On: 12/11/2023 22:40    Procedures Procedure Name: Intubation Date/Time: 01/06/2024 11:15 AM  Performed by: Cheyenne Cotta, MDPre-anesthesia Checklist: Patient identified, Patient being monitored, Emergency Drugs available, Timeout performed and Suction available Oxygen Delivery Method: Non-rebreather mask Preoxygenation: Pre-oxygenation with 100% oxygen Induction Type: Rapid  sequence Ventilation: Mask ventilation without difficulty Laryngoscope Size: 1 Grade View: Grade II Tube size: 7.5 mm Placement Confirmation: ETT inserted through vocal cords under direct vision, CO2 detector and Breath sounds checked- equal and bilateral Comments: Patient was intubated on the first try with glide scope.  He was given succinylcholine  and etomidate         Medications Ordered in ED Medications  propofol  (DIPRIVAN ) 1000 MG/100ML infusion (15 mcg/kg/min  95.5 kg Intravenous Rate/Dose Change 12/11/23 2250)  fentaNYL  in NS (10mcg/ml) infusion-PREMIX (has [name] administration in time range)  sodium bicarbonate  150 mEq in dextrose  5 % 1,150 mL infusion (has [name] administration in time range)  EPINEPHrine  (ADRENALIN ) 1 MG/10ML injection (1 mg Intravenous Given 01/07/2024 0048)  etomidate  (AMIDATE ) injection (20 mg Intravenous Given 12/11/23 2219)  succinylcholine  (ANECTINE ) injection (80 mg Intravenous Given 12/11/23 2221)  midazolam  (VERSED ) 5 MG/5ML injection 5 mg (5 mg Intravenous Given 12/11/23 2346)  sodium bicarbonate  injection 50 mEq (50 mEq Intravenous Given 12/11/23 2348)  methylPREDNISolone  sodium succinate (SOLU-MEDROL ) 125 mg/2 mL injection 125 mg (125 mg Intravenous Given 12/18/2023 0005)    ED Course/ Medical Decision Making/ A&P  CRITICAL CARE Performed by: Cheyenne Cotta Total critical care time: 70 minutes Critical care time was exclusive of separately billable procedures and treating other patients. Critical care was necessary to treat or prevent imminent or life-threatening deterioration. Critical care was time spent personally by me on the following activities: development of treatment plan with patient and/or surrogate as well as nursing, discussions with consultants, evaluation of patient's response to treatment, examination of patient, obtaining history from patient or surrogate, ordering and performing treatments and interventions, ordering and review of  laboratory studies, ordering and review of radiographic studies, pulse oximetry and re-evaluation of patient's condition.  Medical Decision Making Amount and/or Complexity of Data Reviewed Labs: ordered. Radiology: ordered.  Risk Prescription drug management.   Cardiopulmonary arrest continue care will be done by Dr. Carie Charity        Final Clinical Impression(s) / ED Diagnoses Final diagnoses:  None    Rx / DC Orders ED Discharge Orders     None         Cheyenne Cotta, MD 12/16/2023 1116

## 2024-01-11 NOTE — ED Notes (Signed)
 Dr Carol Chroman remains at bedside, TOD called at 01:06

## 2024-01-11 NOTE — ED Notes (Signed)
 Pt hr decreased to 35, bp 70's systolic, Dr Carol Chroman notified and at bedside,

## 2024-01-11 DEATH — deceased
